# Patient Record
Sex: Male | Born: 1945
Health system: Southern US, Community
[De-identification: ages and names within clinical notes are randomized; demographics above are authoritative.]

## PROBLEM LIST (undated history)

## (undated) ENCOUNTER — Encounter

## (undated) ENCOUNTER — Telehealth

## (undated) DIAGNOSIS — K219 Gastro-esophageal reflux disease without esophagitis: Secondary | ICD-10-CM

## (undated) DIAGNOSIS — I10 Essential (primary) hypertension: Secondary | ICD-10-CM

## (undated) DIAGNOSIS — E7800 Pure hypercholesterolemia, unspecified: Secondary | ICD-10-CM

## (undated) DIAGNOSIS — M1990 Unspecified osteoarthritis, unspecified site: Secondary | ICD-10-CM

## (undated) DIAGNOSIS — E119 Type 2 diabetes mellitus without complications: Secondary | ICD-10-CM

## (undated) DIAGNOSIS — I2510 Atherosclerotic heart disease of native coronary artery without angina pectoris: Secondary | ICD-10-CM

## (undated) DIAGNOSIS — D469 Myelodysplastic syndrome, unspecified: Secondary | ICD-10-CM

## (undated) DIAGNOSIS — C229 Malignant neoplasm of liver, not specified as primary or secondary: Secondary | ICD-10-CM

## (undated) HISTORY — DX: Myelodysplastic syndrome, unspecified: D46.9

---

## 2000-01-05 HISTORY — PX: APPENDECTOMY: SHX54

## 2011-07-01 LAB — COMPREHENSIVE METABOLIC PANEL
BUN: 11 mg/dL (ref 7–18)
Bilirubin,Total: 0.9 mg/dL (ref 0.2–1.0)
Calcium, Total: 8.6 mg/dL (ref 8.5–10.1)
Chloride: 103 mmol/L (ref 98–107)
Creatinine: 1.32 mg/dL — ABNORMAL HIGH (ref 0.60–1.30)
Potassium: 3.6 mmol/L (ref 3.5–5.1)
SGOT(AST): 35 U/L (ref 15–37)
Sodium: 138 mmol/L (ref 136–145)

## 2011-07-01 LAB — CBC
HGB: 16.5 g/dL (ref 13.0–18.0)
MCH: 32.9 pg (ref 26.0–34.0)
MCHC: 34.7 g/dL (ref 32.0–36.0)
MCV: 95 fL (ref 80–100)
Platelet: 111 10*3/uL — ABNORMAL LOW (ref 150–440)
RBC: 5.01 10*6/uL (ref 4.40–5.90)
WBC: 10.3 10*3/uL (ref 3.8–10.6)

## 2011-07-01 LAB — CK TOTAL AND CKMB (NOT AT ARMC): CK, Total: 159 U/L (ref 35–232)

## 2011-07-01 IMAGING — CR DG CHEST 2V
1 series · 2 of 2 positions shown · non-contrast
Comparison: none

REASON FOR EXAM: chest pain
COMMENTS:   May transport without cardiac monitor

PROCEDURE:     DXR - DXR CHEST PA (OR AP) AND LATERAL  - [DATE] [DATE]
RESULT:     The lungs are clear. The heart and pulmonary vessels are normal.
The bony and mediastinal structures are unremarkable. There is no effusion.
There is no pneumothorax or evidence of congestive failure.

[Series 1: pa · 0.17mm/px · 2 of 2 slices shown]
[im 1/2]
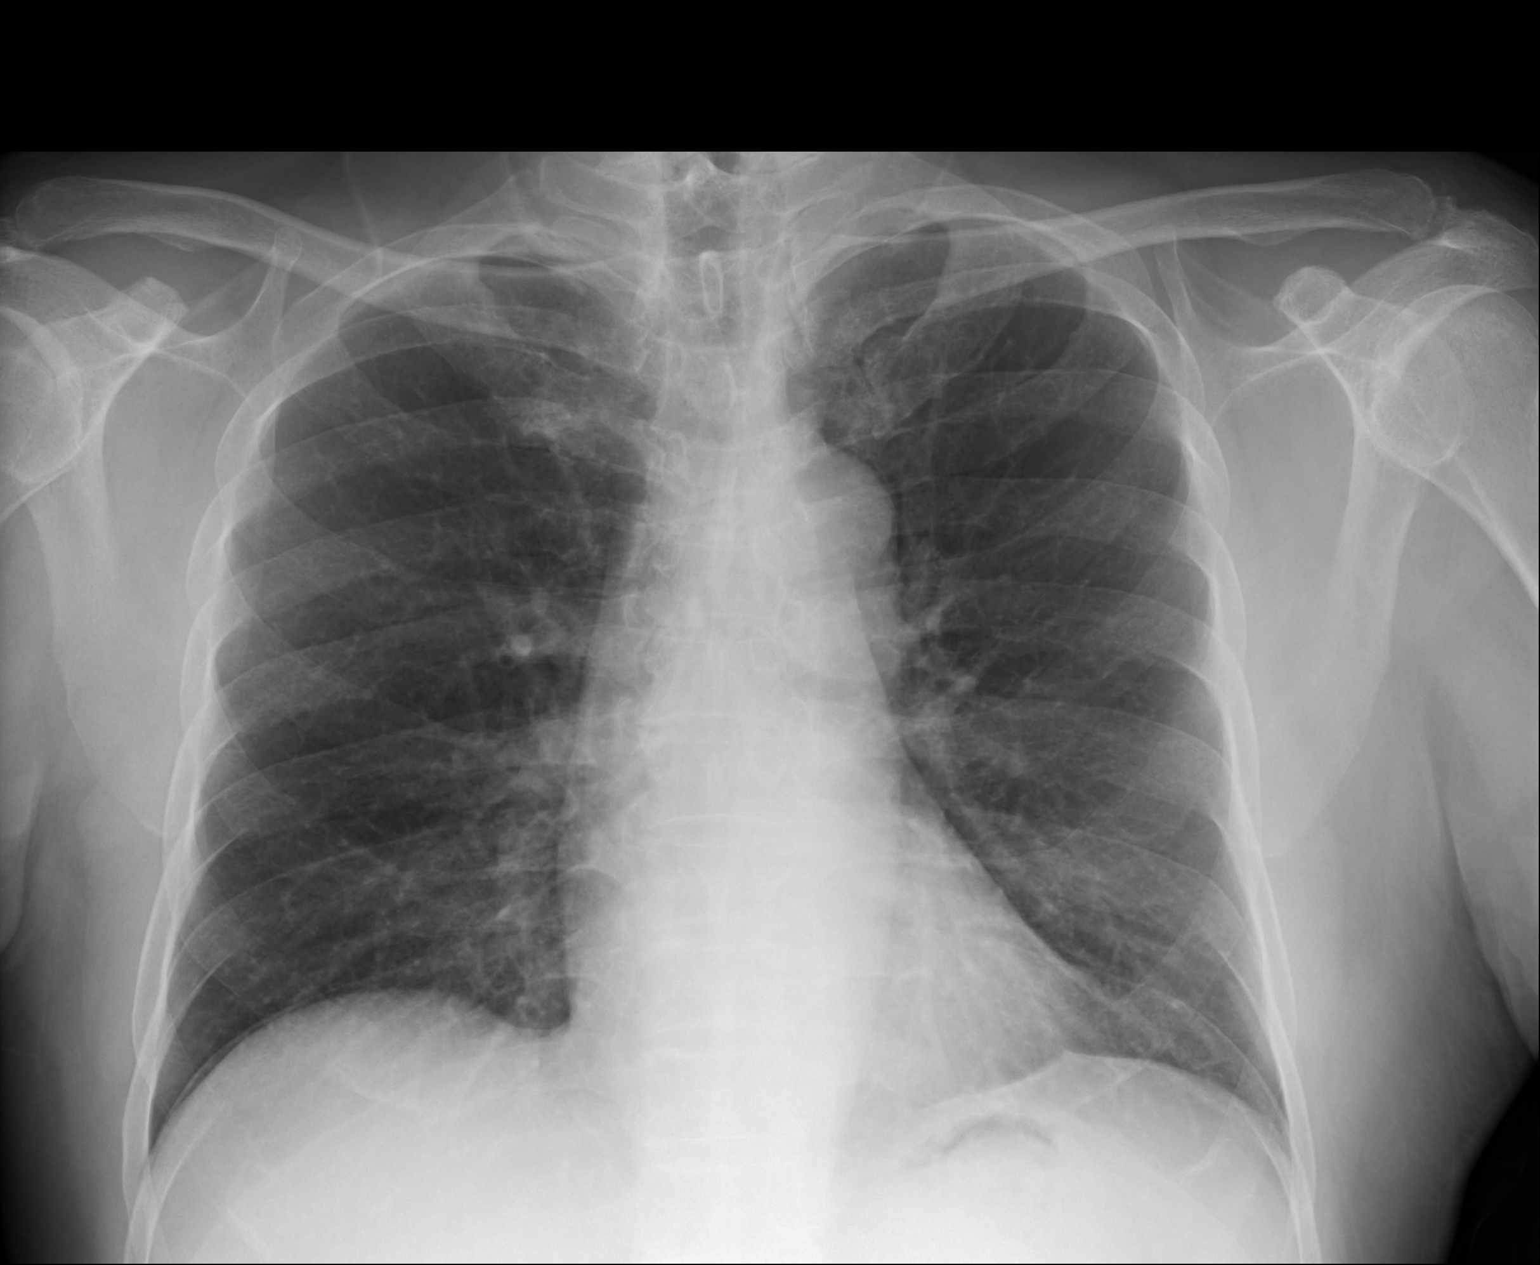
[im 2/2]
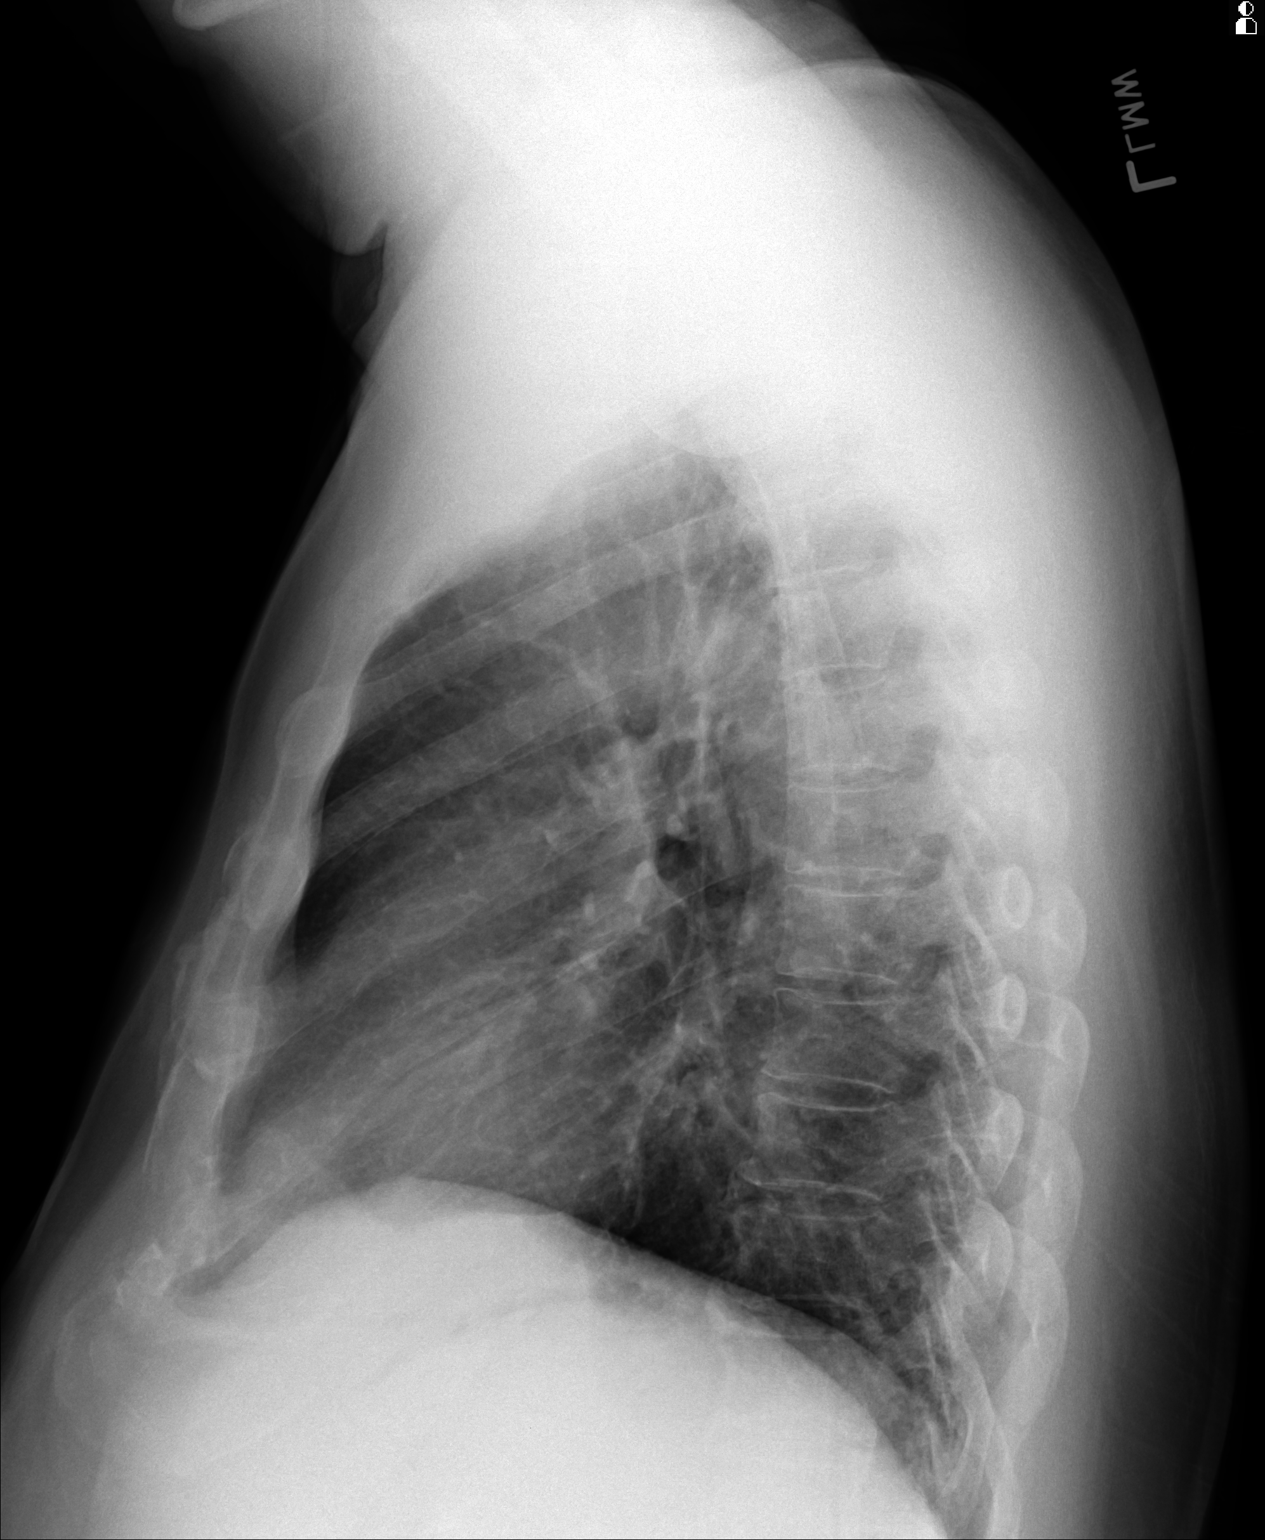

[2 of 2 positions shown; findings below may reference images not displayed]

IMPRESSION: No acute cardiopulmonary disease.

[REDACTED]

## 2011-07-02 ENCOUNTER — Observation Stay: Payer: Self-pay | Admitting: Internal Medicine

## 2011-07-02 LAB — URINALYSIS, COMPLETE
Bilirubin,UR: NEGATIVE
Leukocyte Esterase: NEGATIVE
Nitrite: NEGATIVE
Ph: 5 (ref 4.5–8.0)
RBC,UR: NONE SEEN /HPF (ref 0–5)
WBC UR: NONE SEEN /HPF (ref 0–5)

## 2011-07-02 LAB — TROPONIN I: Troponin-I: <0.02 ng/mL

## 2011-07-05 DIAGNOSIS — E119 Type 2 diabetes mellitus without complications: Secondary | ICD-10-CM | POA: Insufficient documentation

## 2011-10-04 DIAGNOSIS — K219 Gastro-esophageal reflux disease without esophagitis: Secondary | ICD-10-CM | POA: Insufficient documentation

## 2012-02-29 DIAGNOSIS — R0789 Other chest pain: Secondary | ICD-10-CM | POA: Insufficient documentation

## 2012-04-14 ENCOUNTER — Ambulatory Visit: Payer: Self-pay

## 2012-04-14 IMAGING — CR DG ABDOMEN 1V
1 series · 2 of 2 positions shown · non-contrast
Comparison: none

REASON FOR EXAM: kidney stones
COMMENTS:

PROCEDURE:     KDR - KDXR KIDNEY URETER BLADDER  - [DATE]  [DATE]
RESULT:     Comparisons:  None

[Series 1: supine kub · 0.17mm/px · 2 of 2 slices shown]
[im 1/2]
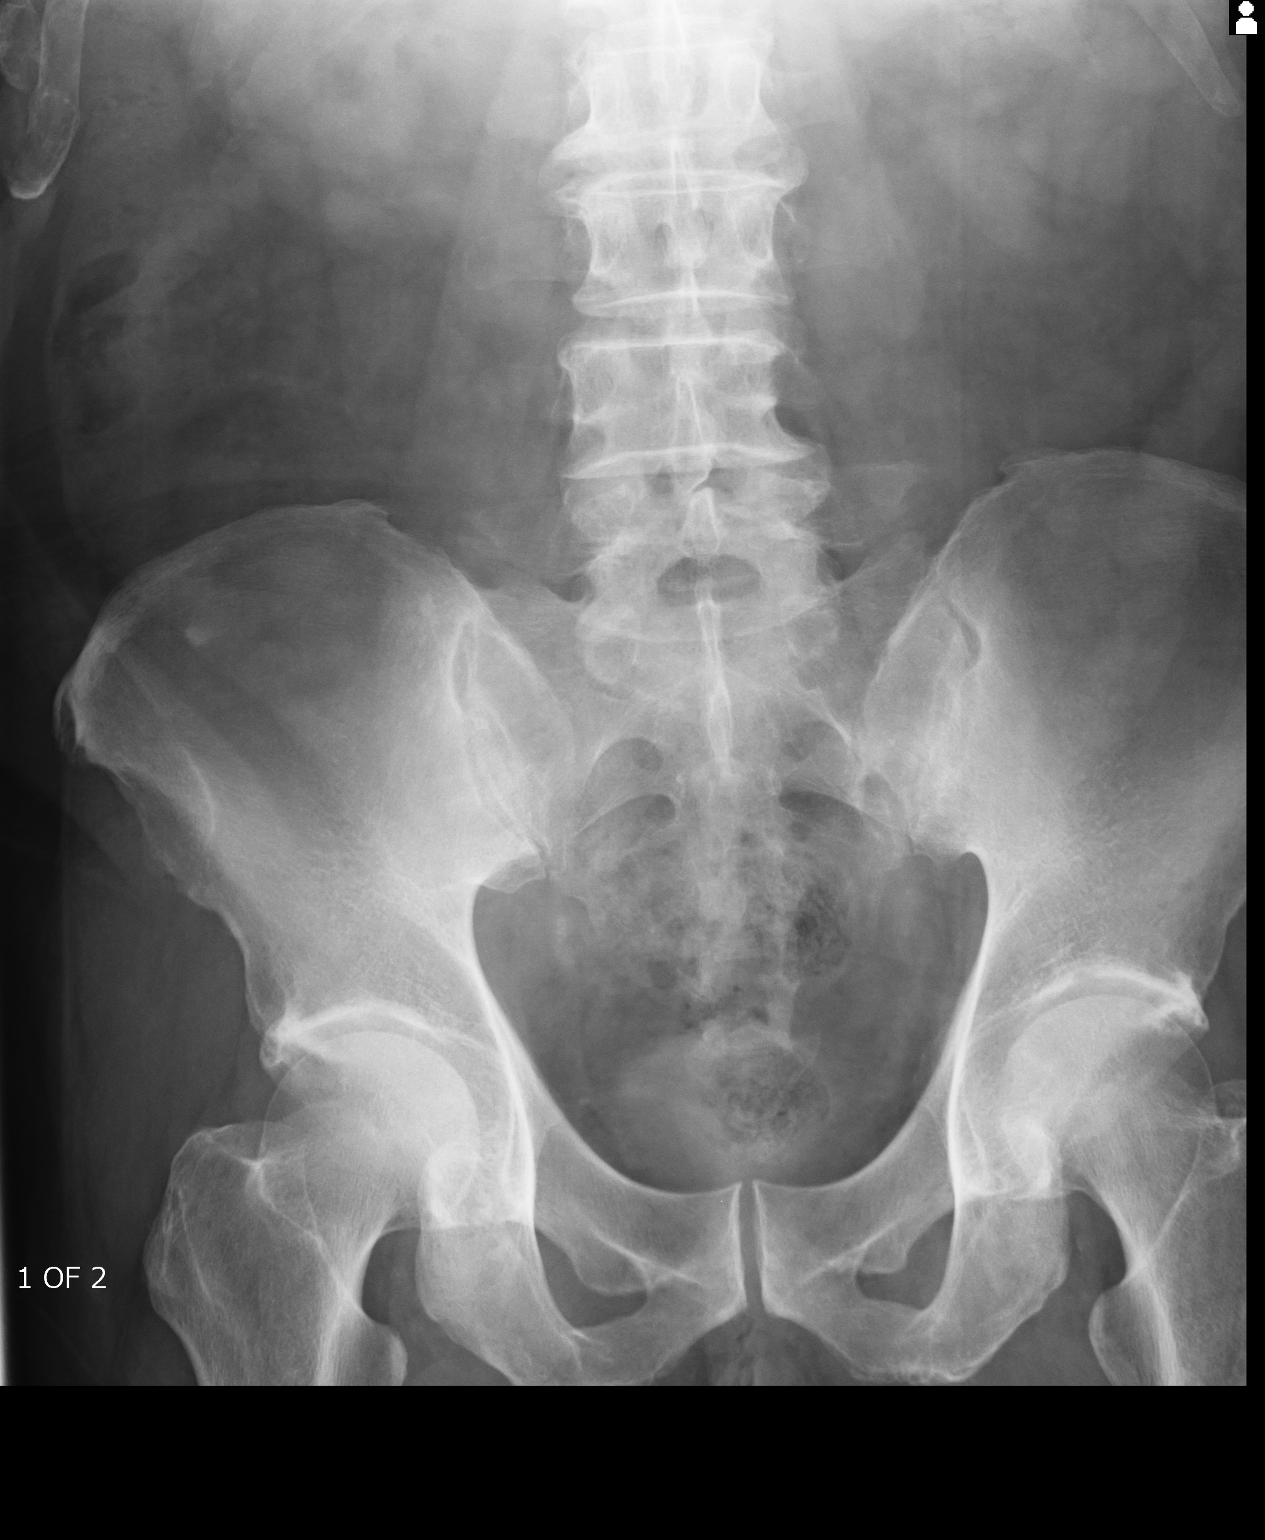
[im 2/2]
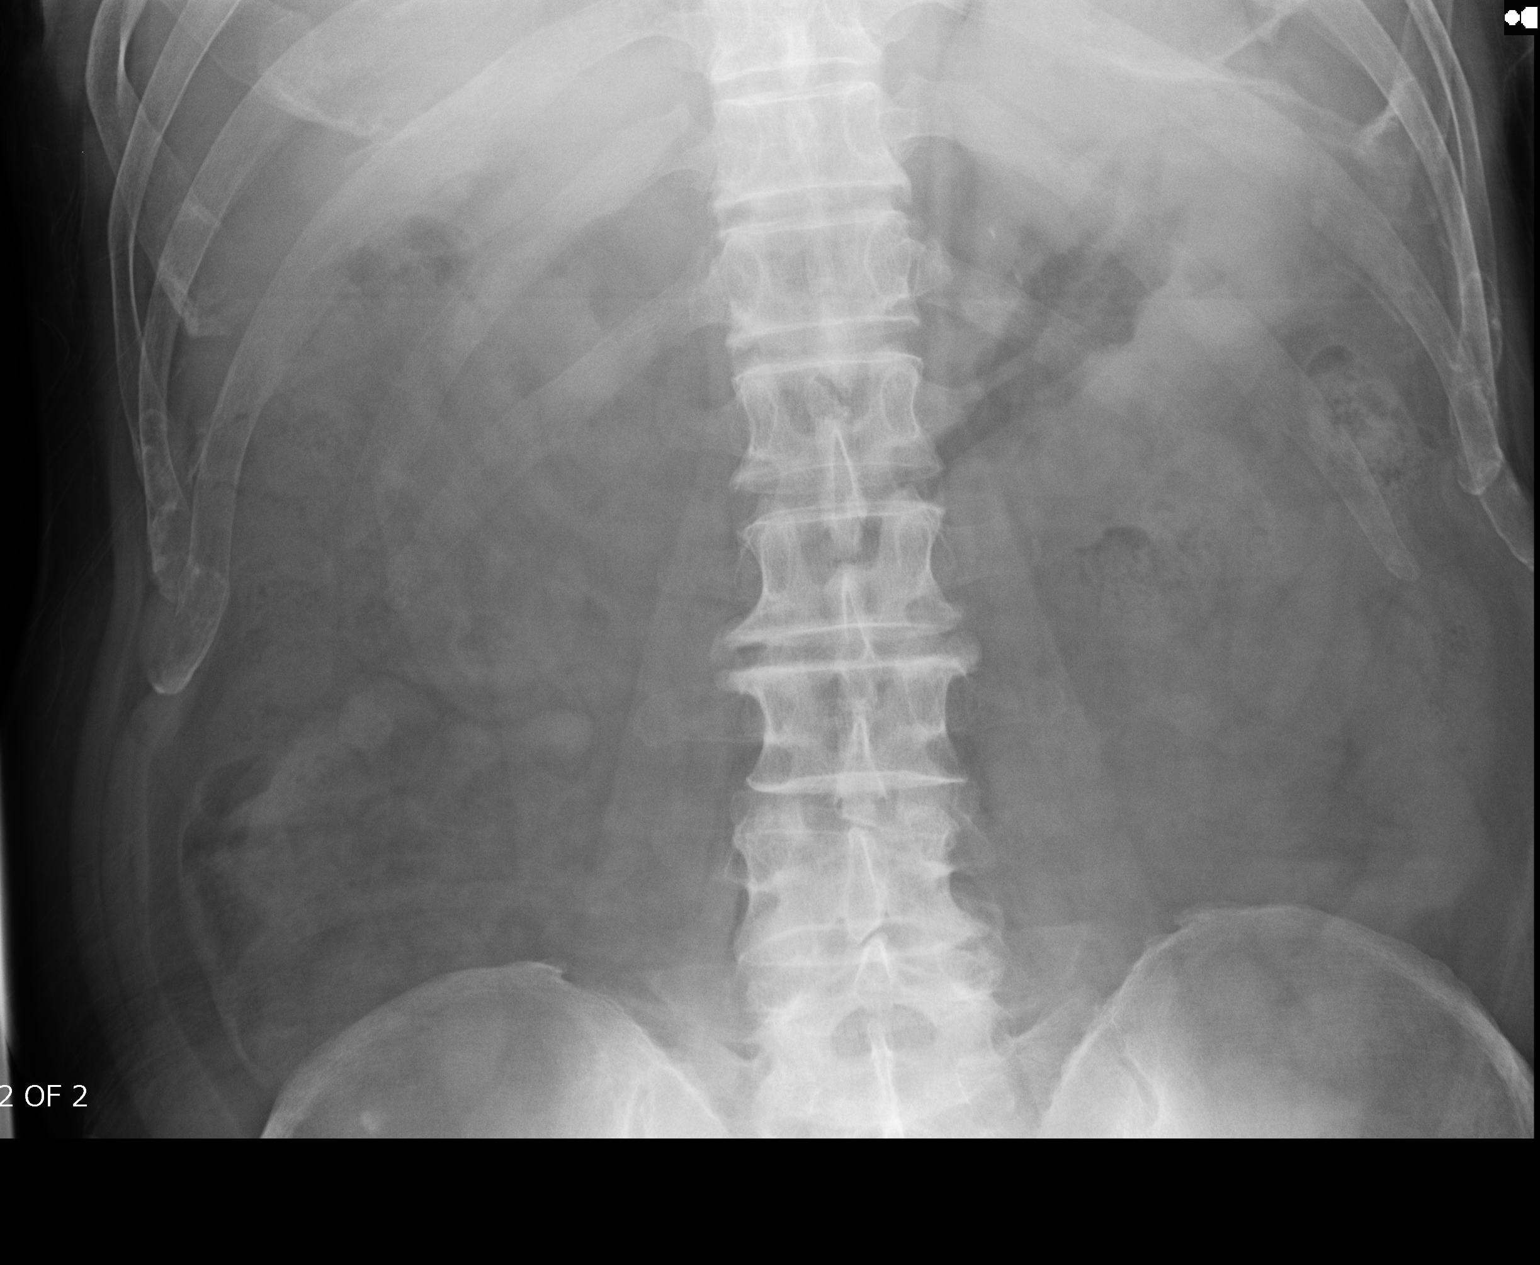

[2 of 2 positions shown; findings below may reference images not displayed]

FINDINGS: Supine radiograph of the abdomen is provided.

There is a nonspecific bowel gas pattern. There is no bowel dilatation to
suggest obstruction. There are 2 calcifications projecting over the left
kidney. There is no pathologic calcification along the expected course of
the ureters. There is no evidence of pneumoperitoneum, portal venous gas, or
pneumatosis.

The osseous structures are unremarkable.
IMPRESSION: Two calcifications projecting over the left kidney concerning for
nephrolithiasis.

[REDACTED]

## 2012-06-22 ENCOUNTER — Ambulatory Visit: Payer: Self-pay | Admitting: Urology

## 2012-06-22 IMAGING — CT CT ABDOMEN AND PELVIS WITHOUT AND WITH CONTRAST
2 of 4 series · 14 of 32 positions shown, 19 images · non-contrast
Comparison: none

REASON FOR EXAM: Renal Neoplasm
COMMENTS:

PROCEDURE:     KCT - KCT ABDOMEN/PELVIS W/WO  - [DATE]  [DATE]
RESULT:     History call renal neoplasm.
Comparison Study: KUB [DATE].

[Series 4: with 3.0 i40f 3 · axial · 0.82mm/px · z∈[-1108,-727]mm · 8 of 165 slices shown, 13 images]
[im 19/165  soft-tissue]
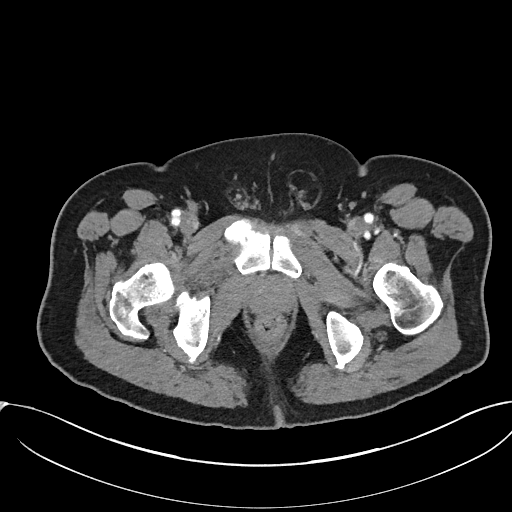
[im 19/165  bone]
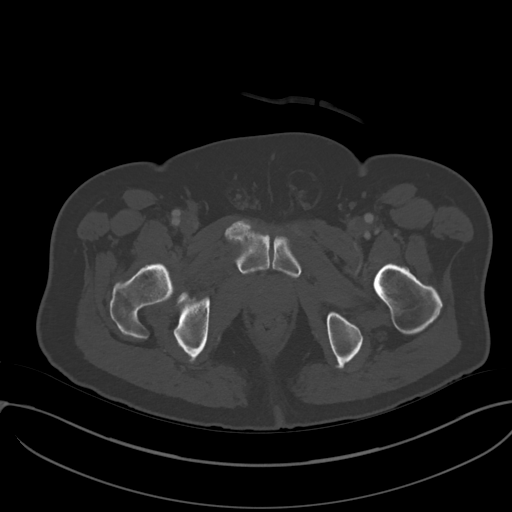
[im 37/165  soft-tissue]
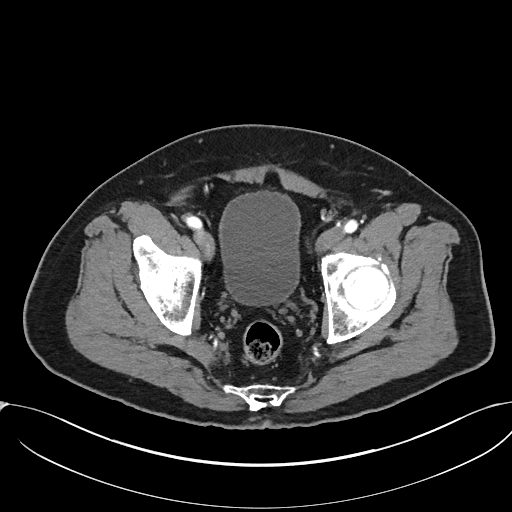
[im 55/165  soft-tissue]
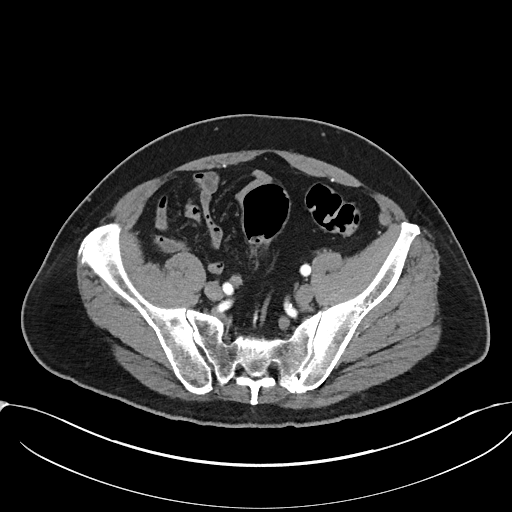
[im 73/165  soft-tissue]
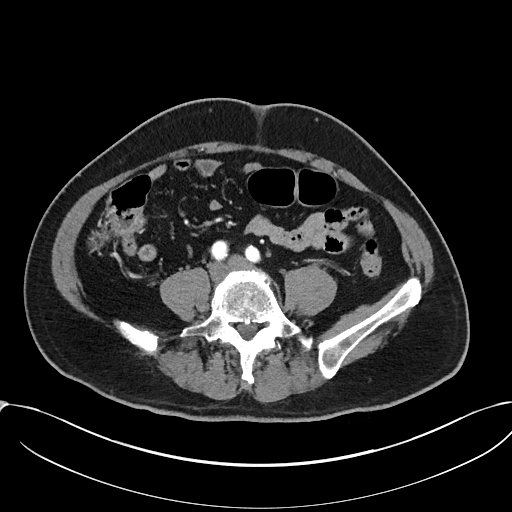
[im 92/165  soft-tissue]
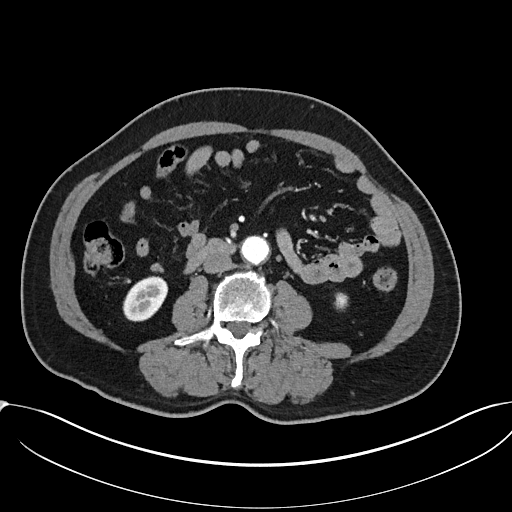
[im 92/165  lung]
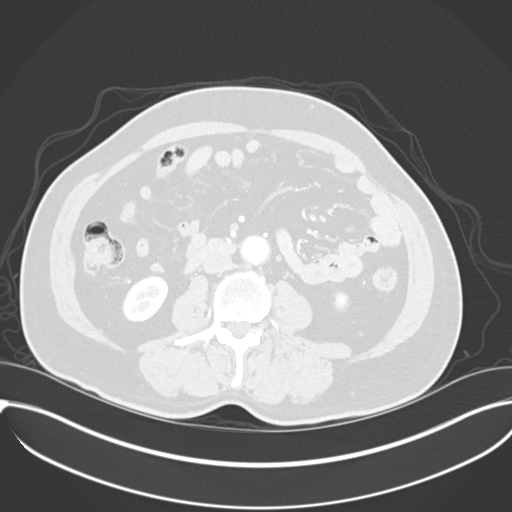
[im 110/165  soft-tissue]
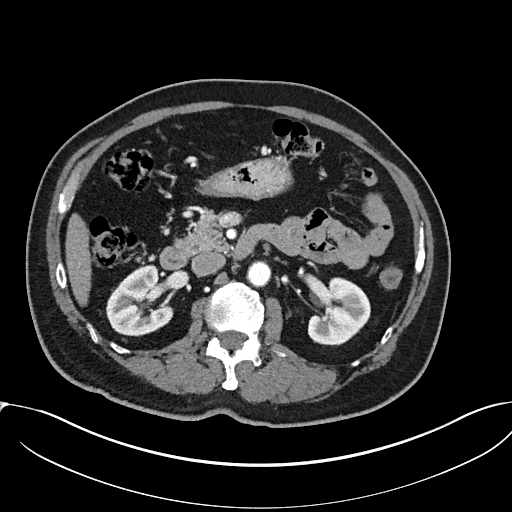
[im 110/165  lung]
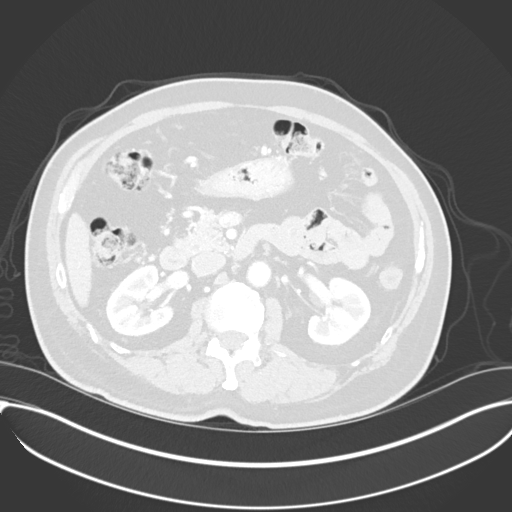
[im 128/165  soft-tissue]
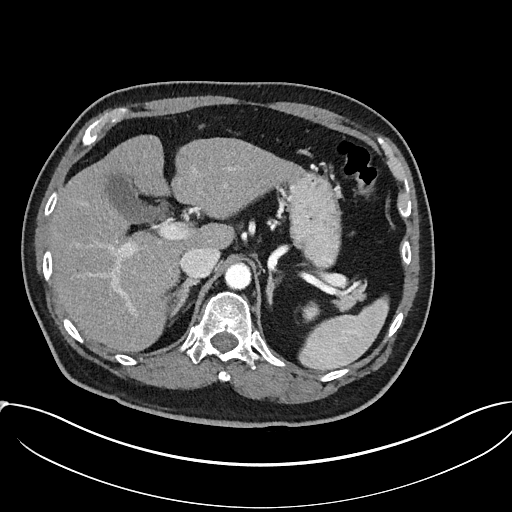
[im 128/165  lung]
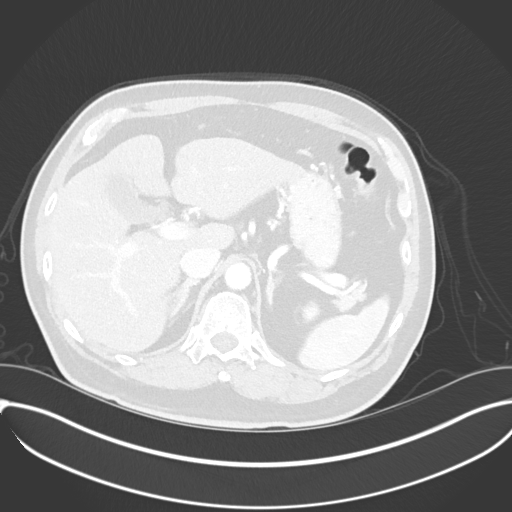
[im 146/165  soft-tissue]
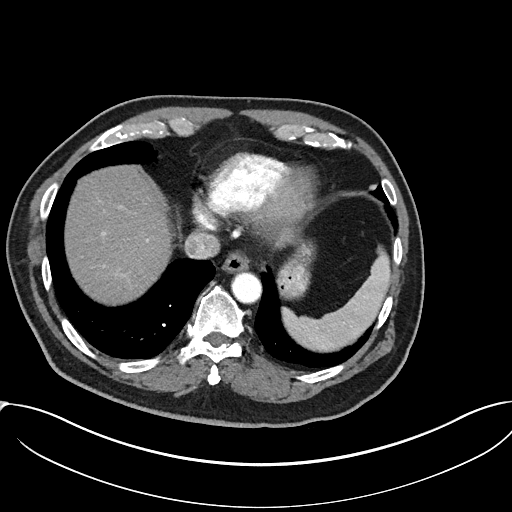
[im 146/165  lung]
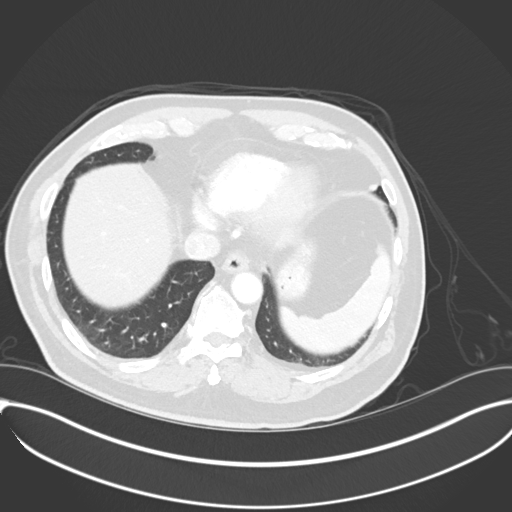

[Series 6: delay 3.0 i40f 3 · axial · delayed · 0.82mm/px · z∈[-1108,-835]mm · 6 of 165 slices shown]
[im 19/165  soft-tissue]
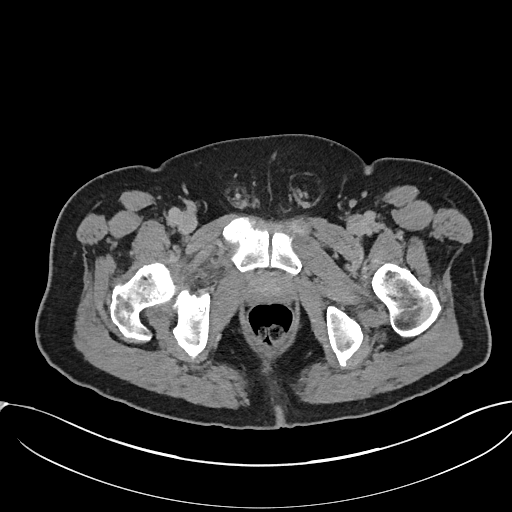
[im 37/165  soft-tissue]
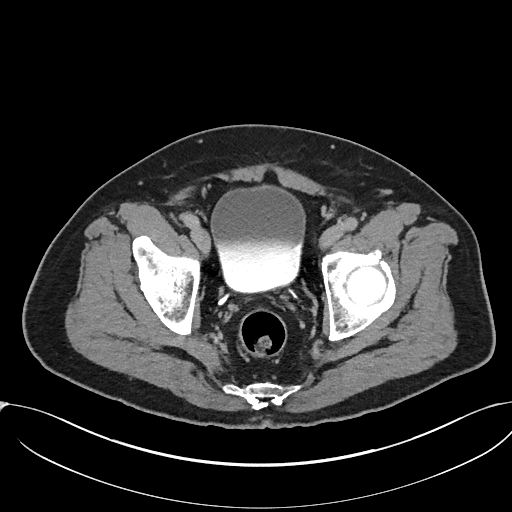
[im 55/165  soft-tissue]
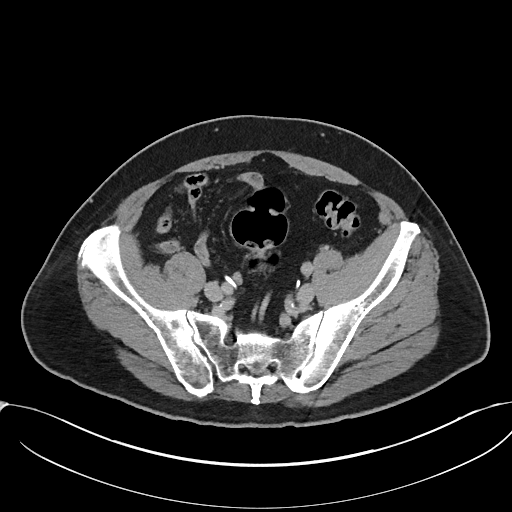
[im 73/165  soft-tissue]
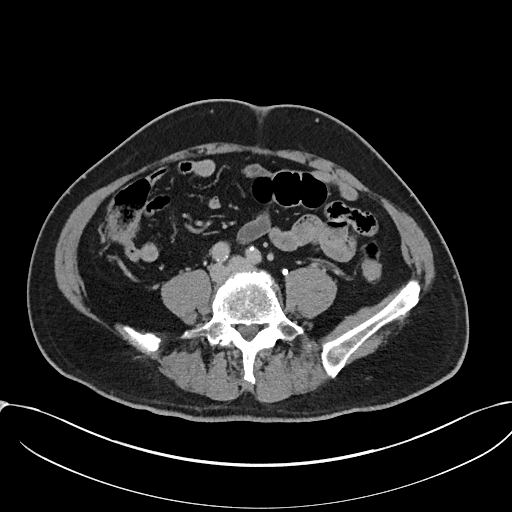
[im 92/165  soft-tissue]
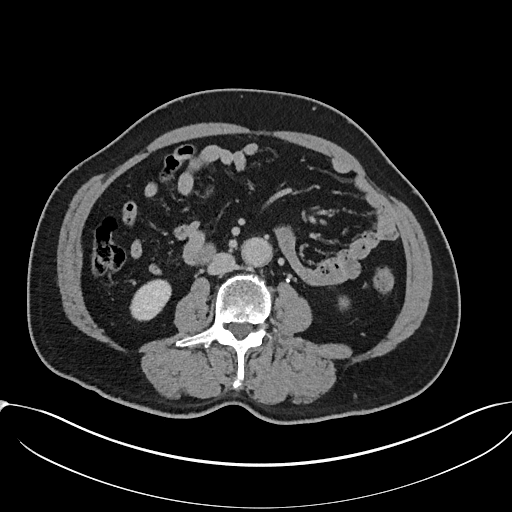
[im 110/165  soft-tissue]
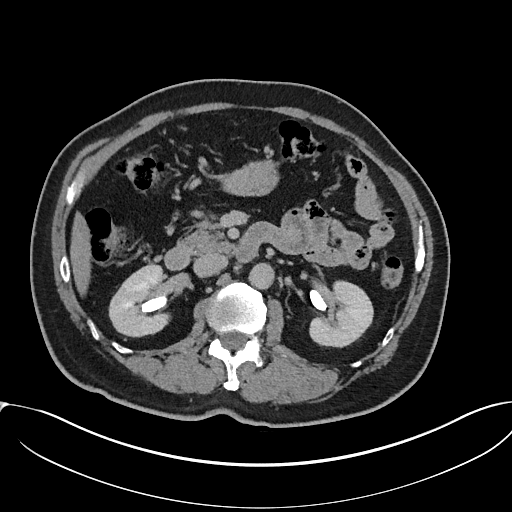

[14 of 32 positions shown; findings below may reference images not displayed]

FINDINGS: Standard triphasic CT obtained.
2.2 cm simple left renal cyst is present. The bladder is nondistended. No
focal bladder lesion identified. The prostate is enlarged and nodular.
Prostate malignancy cannot be excluded. Small inguinal lymph nodes are
present. Shotty retroperitoneal lymph nodes are noted.

Liver is normal. Gallbladder nondistended. No biliary distention. The
pancreas is normal. The spleen is normal. The hepatic veins are patent.
Portal vein and splenic vein patent. Adrenals normal. No inflammatory change
in right or left lower quadrant. Appendix is not visualized. Mild ectasia of
the abdomen was noted. No bowel distention. No free air. No acute bony
abnormality identified.
IMPRESSION: 1. Enlarged prostate with nodularity. Prostate malignancy cannot be excluded.
2. Simple left renal cyst. Kidneys are otherwise unremarkable. No nephro or
urolithiasis.

## 2012-10-24 ENCOUNTER — Encounter (HOSPITAL_COMMUNITY): Payer: Self-pay | Admitting: General Practice

## 2012-10-24 ENCOUNTER — Inpatient Hospital Stay (HOSPITAL_COMMUNITY): Payer: Medicare Other

## 2012-10-24 ENCOUNTER — Inpatient Hospital Stay (HOSPITAL_COMMUNITY)
Admission: AD | Admit: 2012-10-24 | Discharge: 2012-10-30 | DRG: 236 | Disposition: A | Discharge status: Home / Self Care | Payer: Medicare Other | Source: Other Acute Inpatient Hospital | Attending: Thoracic Surgery (Cardiothoracic Vascular Surgery) | Admitting: Thoracic Surgery (Cardiothoracic Vascular Surgery)

## 2012-10-24 ENCOUNTER — Ambulatory Visit: Payer: Self-pay | Admitting: Cardiovascular Disease

## 2012-10-24 DIAGNOSIS — I4891 Unspecified atrial fibrillation: Secondary | ICD-10-CM | POA: Diagnosis not present

## 2012-10-24 DIAGNOSIS — J90 Pleural effusion, not elsewhere classified: Secondary | ICD-10-CM | POA: Diagnosis not present

## 2012-10-24 DIAGNOSIS — I10 Essential (primary) hypertension: Secondary | ICD-10-CM

## 2012-10-24 DIAGNOSIS — E1169 Type 2 diabetes mellitus with other specified complication: Secondary | ICD-10-CM

## 2012-10-24 DIAGNOSIS — Z951 Presence of aortocoronary bypass graft: Secondary | ICD-10-CM

## 2012-10-24 DIAGNOSIS — Z7982 Long term (current) use of aspirin: Secondary | ICD-10-CM

## 2012-10-24 DIAGNOSIS — I2510 Atherosclerotic heart disease of native coronary artery without angina pectoris: Secondary | ICD-10-CM

## 2012-10-24 DIAGNOSIS — E1165 Type 2 diabetes mellitus with hyperglycemia: Secondary | ICD-10-CM

## 2012-10-24 DIAGNOSIS — Z79899 Other long term (current) drug therapy: Secondary | ICD-10-CM

## 2012-10-24 DIAGNOSIS — K219 Gastro-esophageal reflux disease without esophagitis: Secondary | ICD-10-CM | POA: Diagnosis present

## 2012-10-24 DIAGNOSIS — E119 Type 2 diabetes mellitus without complications: Secondary | ICD-10-CM | POA: Diagnosis present

## 2012-10-24 DIAGNOSIS — E785 Hyperlipidemia, unspecified: Secondary | ICD-10-CM | POA: Diagnosis present

## 2012-10-24 DIAGNOSIS — D62 Acute posthemorrhagic anemia: Secondary | ICD-10-CM | POA: Diagnosis not present

## 2012-10-24 DIAGNOSIS — J988 Other specified respiratory disorders: Secondary | ICD-10-CM | POA: Diagnosis not present

## 2012-10-24 DIAGNOSIS — Z87891 Personal history of nicotine dependence: Secondary | ICD-10-CM

## 2012-10-24 DIAGNOSIS — E8779 Other fluid overload: Secondary | ICD-10-CM | POA: Diagnosis not present

## 2012-10-24 DIAGNOSIS — J9819 Other pulmonary collapse: Secondary | ICD-10-CM | POA: Diagnosis not present

## 2012-10-24 HISTORY — DX: Pure hypercholesterolemia, unspecified: E78.00

## 2012-10-24 HISTORY — PX: CARDIAC CATHETERIZATION: SHX172

## 2012-10-24 HISTORY — DX: Essential (primary) hypertension: I10

## 2012-10-24 HISTORY — DX: Unspecified osteoarthritis, unspecified site: M19.90

## 2012-10-24 HISTORY — DX: Gastro-esophageal reflux disease without esophagitis: K21.9

## 2012-10-24 HISTORY — DX: Atherosclerotic heart disease of native coronary artery without angina pectoris: I25.10

## 2012-10-24 HISTORY — DX: Type 2 diabetes mellitus without complications: E11.9

## 2012-10-24 LAB — COMPREHENSIVE METABOLIC PANEL
Alkaline Phosphatase: 60 U/L (ref 50–136)
Anion Gap: 5 — ABNORMAL LOW (ref 7–16)
BUN: 15 mg/dL (ref 7–18)
Bilirubin,Total: 0.7 mg/dL (ref 0.2–1.0)
Calcium, Total: 8.9 mg/dL (ref 8.5–10.1)
Chloride: 106 mmol/L (ref 98–107)
Co2: 27 mmol/L (ref 21–32)
Creatinine: 1.01 mg/dL (ref 0.60–1.30)
EGFR (African American): >60
EGFR (Non-African Amer.): >60
Glucose: 115 mg/dL — ABNORMAL HIGH (ref 65–99)
Potassium: 3.9 mmol/L (ref 3.5–5.1)
SGOT(AST): 29 U/L (ref 15–37)
SGPT (ALT): 43 U/L (ref 12–78)
Sodium: 138 mmol/L (ref 136–145)

## 2012-10-24 LAB — GLUCOSE, CAPILLARY: Glucose-Capillary: 114 mg/dL — ABNORMAL HIGH (ref 70–99)

## 2012-10-24 LAB — CBC WITH DIFFERENTIAL/PLATELET
Eosinophil %: 2 %
HCT: 43.8 % (ref 40.0–52.0)
HGB: 15.6 g/dL (ref 13.0–18.0)
Lymphocyte #: 1.8 10*3/uL (ref 1.0–3.6)
Lymphocyte %: 24.3 %
MCHC: 35.6 g/dL (ref 32.0–36.0)
Monocyte #: 0.5 x10 3/mm (ref 0.2–1.0)
Neutrophil %: 65.8 %
Platelet: 165 10*3/uL (ref 150–440)
RBC: 4.79 10*6/uL (ref 4.40–5.90)
RDW: 13.5 % (ref 11.5–14.5)
WBC: 7.2 10*3/uL (ref 3.8–10.6)

## 2012-10-24 LAB — PROTIME-INR
INR: 1
Prothrombin Time: 13.4 secs (ref 11.5–14.7)

## 2012-10-24 LAB — CBC
Hemoglobin: 14.9 g/dL (ref 13.0–17.0)
MCH: 32.7 pg (ref 26.0–34.0)
Platelets: 146 10*3/uL — ABNORMAL LOW (ref 150–400)
RBC: 4.55 MIL/uL (ref 4.22–5.81)

## 2012-10-24 LAB — APTT: Activated PTT: 33.1 secs (ref 23.6–35.9)

## 2012-10-24 IMAGING — CR DG CHEST 2V
2 series · 2 of 2 positions shown · non-contrast
Comparison: None.

CLINICAL DATA: Preop CABG

EXAM:
CHEST  2 VIEW

[w chest pa]
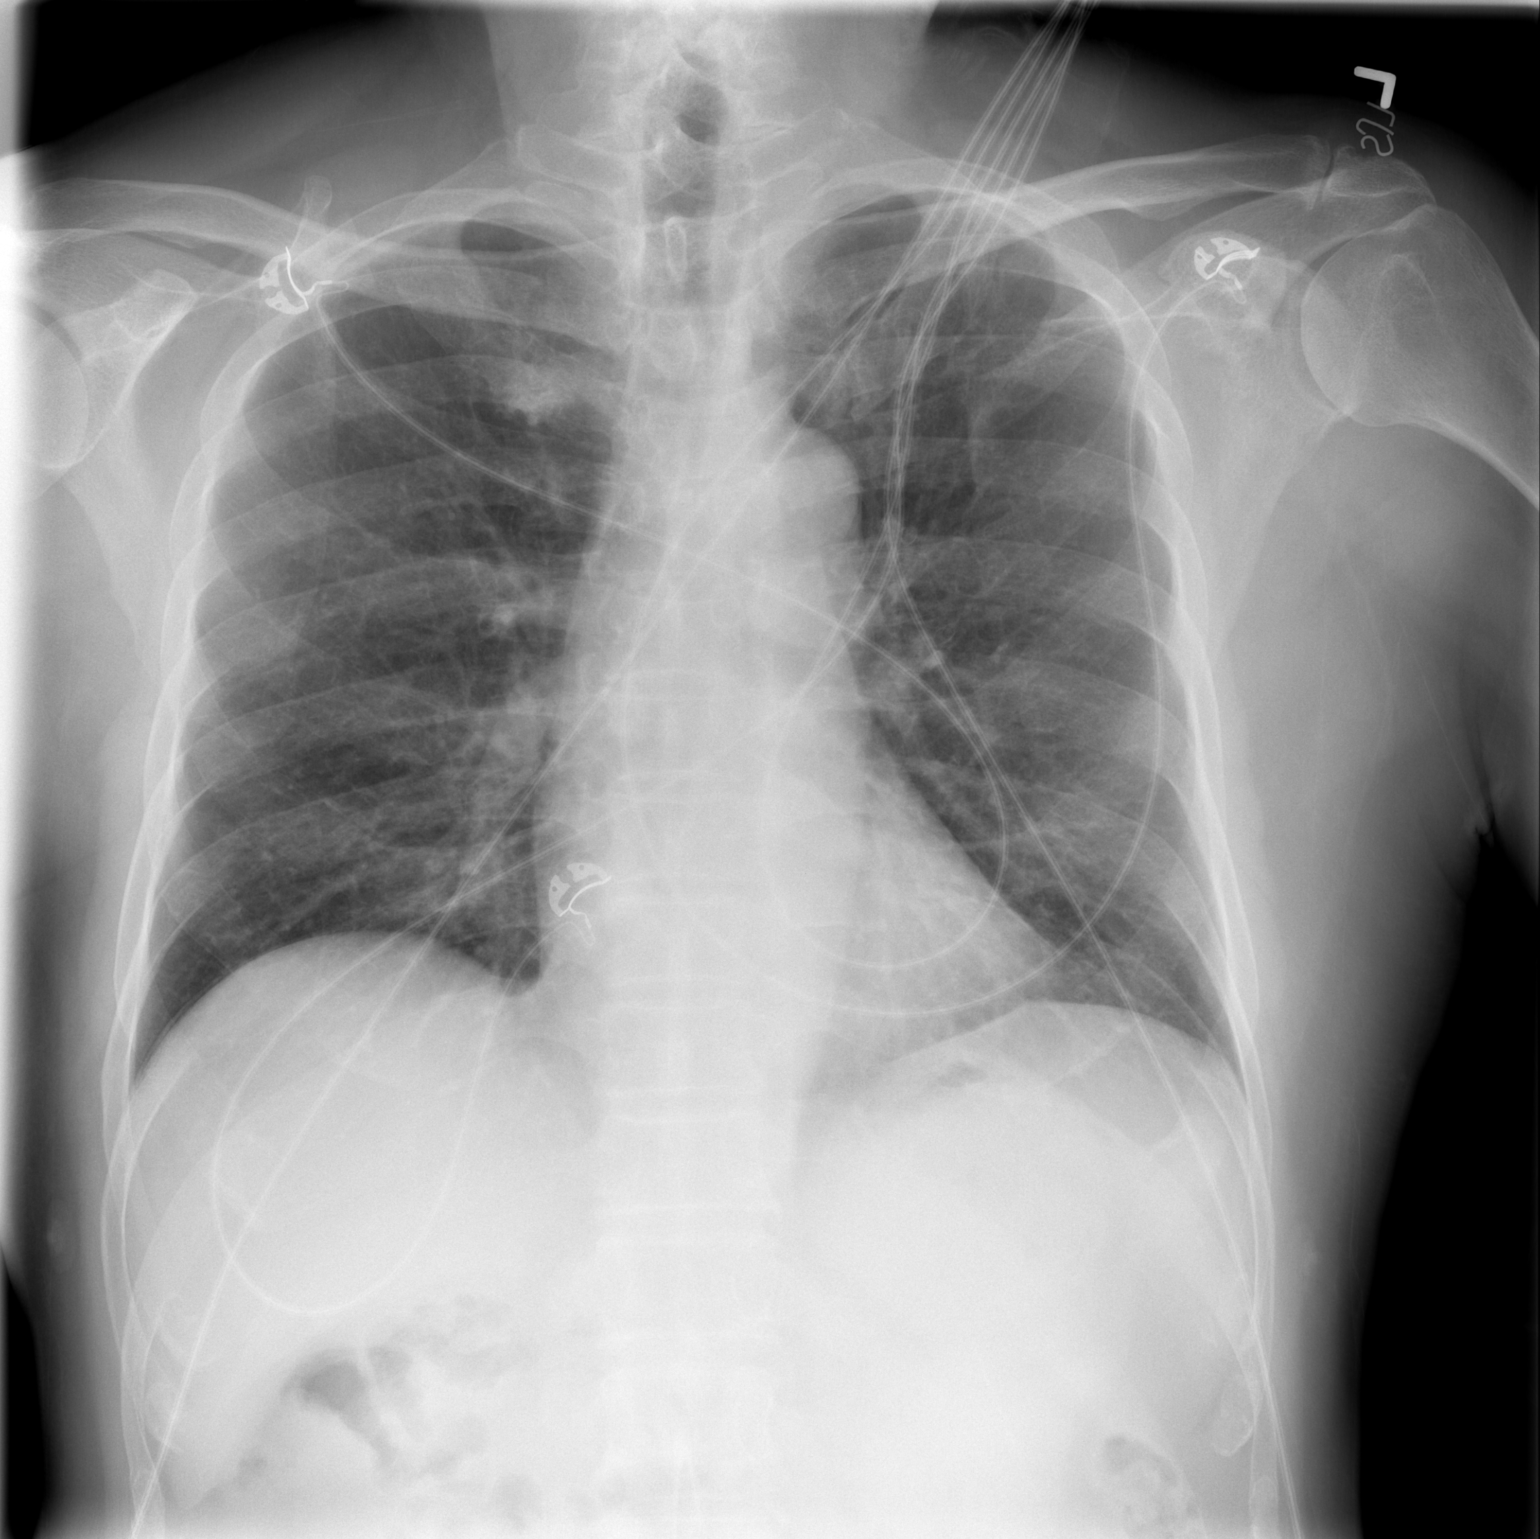

[w chest lat]
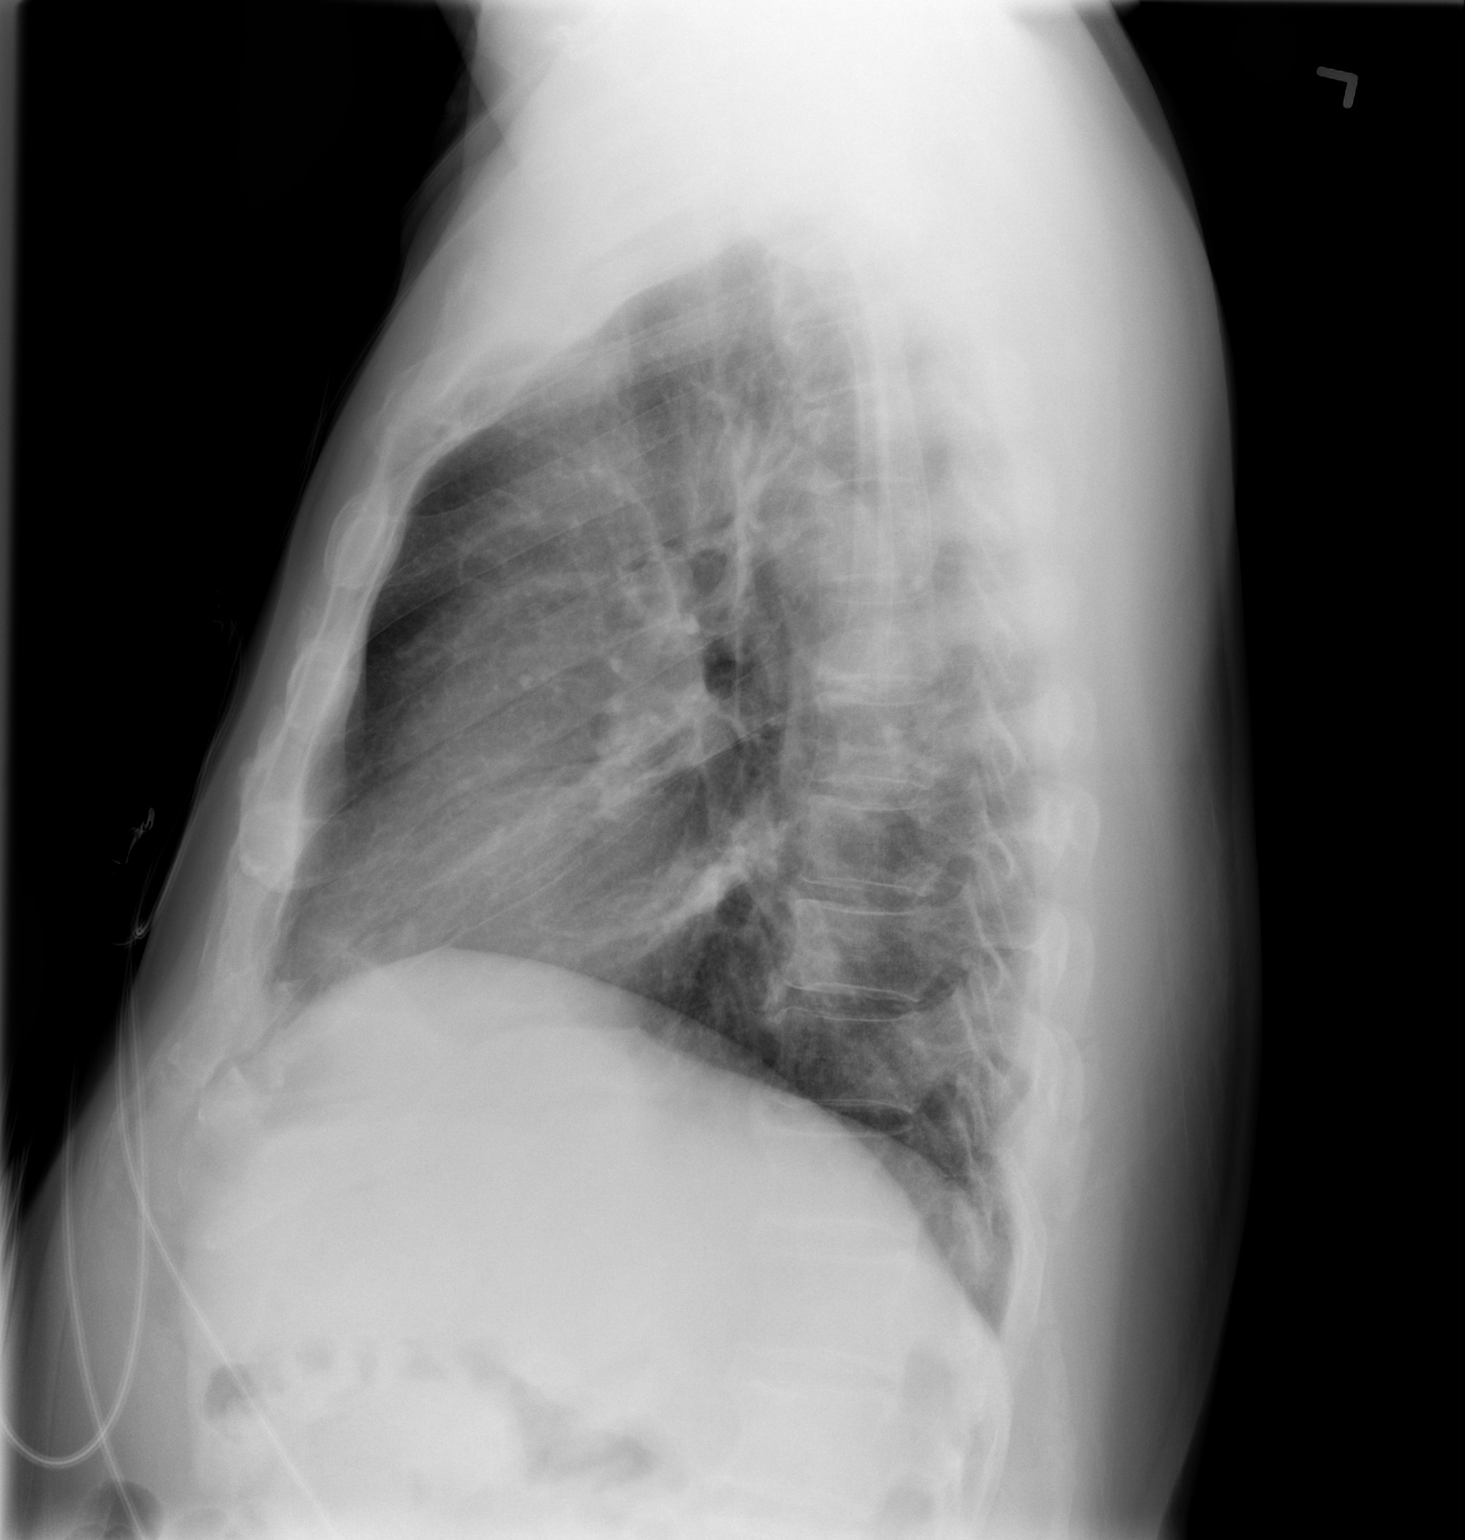

[2 of 2 positions shown; findings below may reference images not displayed]

FINDINGS: Heart size is normal. Vascularity is normal. Lungs are clear without
infiltrate effusion or mass.
IMPRESSION: No active cardiopulmonary disease.

## 2012-10-24 MED ORDER — ACETAMINOPHEN 650 MG RE SUPP: 650.0000 mg | Freq: Four times a day (QID) | RECTAL | Status: DC | PRN

## 2012-10-24 MED ORDER — SODIUM CHLORIDE 0.9 % IJ SOLN
3.0000 mL | Freq: Two times a day (BID) | INTRAMUSCULAR | Status: DC
  Administered 2012-10-24 – 2012-10-25 (×2): 3 mL via INTRAVENOUS

## 2012-10-24 MED ORDER — ALUM & MAG HYDROXIDE-SIMETH 200-200-20 MG/5ML PO SUSP: 30.0000 mL | Freq: Four times a day (QID) | ORAL | Status: DC | PRN

## 2012-10-24 MED ORDER — OXYCODONE HCL 5 MG PO TABS: 5.0000 mg | ORAL_TABLET | ORAL | Status: DC | PRN

## 2012-10-24 MED ORDER — NIACIN ER (ANTIHYPERLIPIDEMIC) 500 MG PO TBCR
500.0000 mg | EXTENDED_RELEASE_TABLET | Freq: Every day | ORAL | Status: DC
  Filled 2012-10-24 (×3): qty 1

## 2012-10-24 MED ORDER — HEPARIN (PORCINE) IN NACL 100-0.45 UNIT/ML-% IJ SOLN
1300.0000 [IU]/h | INTRAMUSCULAR | Status: DC
  Administered 2012-10-24 – 2012-10-25 (×2): 1300 [IU]/h via INTRAVENOUS
  Filled 2012-10-24 (×4): qty 250

## 2012-10-24 MED ORDER — SODIUM CHLORIDE 0.9 % IJ SOLN: 3.0000 mL | Freq: Two times a day (BID) | INTRAMUSCULAR | Status: DC

## 2012-10-24 MED ORDER — ASPIRIN EC 325 MG PO TBEC
325.0000 mg | DELAYED_RELEASE_TABLET | Freq: Every day | ORAL | Status: DC
  Administered 2012-10-24 – 2012-10-25 (×2): 325 mg via ORAL
  Filled 2012-10-24 (×3): qty 1

## 2012-10-24 MED ORDER — SODIUM CHLORIDE 0.9 % IJ SOLN: 3.0000 mL | INTRAMUSCULAR | Status: DC | PRN

## 2012-10-24 MED ORDER — PANTOPRAZOLE SODIUM 40 MG PO TBEC
40.0000 mg | DELAYED_RELEASE_TABLET | Freq: Every day | ORAL | Status: DC
  Administered 2012-10-24 – 2012-10-25 (×2): 40 mg via ORAL
  Filled 2012-10-24 (×2): qty 1

## 2012-10-24 MED ORDER — MAGNESIUM HYDROXIDE 400 MG/5ML PO SUSP: 30.0000 mL | Freq: Every day | ORAL | Status: DC | PRN

## 2012-10-24 MED ORDER — ENOXAPARIN SODIUM 40 MG/0.4ML ~~LOC~~ SOLN
40.0000 mg | SUBCUTANEOUS | Status: DC
  Filled 2012-10-24: qty 0.4

## 2012-10-24 MED ORDER — SIMVASTATIN 40 MG PO TABS
40.0000 mg | ORAL_TABLET | Freq: Every day | ORAL | Status: DC
  Administered 2012-10-25: 40 mg via ORAL
  Filled 2012-10-24 (×2): qty 1

## 2012-10-24 MED ORDER — ACETAMINOPHEN 325 MG PO TABS: 650.0000 mg | ORAL_TABLET | Freq: Four times a day (QID) | ORAL | Status: DC | PRN

## 2012-10-24 MED ORDER — INSULIN ASPART 100 UNIT/ML ~~LOC~~ SOLN: 0.0000 [IU] | Freq: Three times a day (TID) | SUBCUTANEOUS | Status: DC

## 2012-10-24 MED ORDER — NITROGLYCERIN 0.4 MG SL SUBL: 0.4000 mg | SUBLINGUAL_TABLET | SUBLINGUAL | Status: DC | PRN

## 2012-10-24 MED ORDER — ONDANSETRON HCL 4 MG/2ML IJ SOLN: 4.0000 mg | Freq: Four times a day (QID) | INTRAMUSCULAR | Status: DC | PRN

## 2012-10-24 MED ORDER — METOPROLOL SUCCINATE ER 100 MG PO TB24
100.0000 mg | ORAL_TABLET | Freq: Every day | ORAL | Status: DC
  Administered 2012-10-25: 100 mg via ORAL
  Filled 2012-10-24: qty 1

## 2012-10-24 MED ORDER — SODIUM CHLORIDE 0.9 % IV SOLN: 250.0000 mL | INTRAVENOUS | Status: DC | PRN

## 2012-10-24 MED ORDER — HEPARIN BOLUS VIA INFUSION
4000.0000 [IU] | Freq: Once | INTRAVENOUS | Status: DC
  Filled 2012-10-24: qty 4000

## 2012-10-24 MED ORDER — ONDANSETRON HCL 4 MG PO TABS: 4.0000 mg | ORAL_TABLET | Freq: Four times a day (QID) | ORAL | Status: DC | PRN

## 2012-10-24 MED ORDER — LINAGLIPTIN 5 MG PO TABS
5.0000 mg | ORAL_TABLET | Freq: Every day | ORAL | Status: DC
  Administered 2012-10-25: 5 mg via ORAL
  Filled 2012-10-24 (×3): qty 1

## 2012-10-24 MED ORDER — ALLOPURINOL 100 MG PO TABS
100.0000 mg | ORAL_TABLET | Freq: Every day | ORAL | Status: DC
  Filled 2012-10-24 (×3): qty 1

## 2012-10-24 NOTE — H&P (Signed)
Patient seen and examined. Agree with above.  Will start IV heparin  Vascular studies and PFTs tomorrow  Plan CABG Thursday AM- I discussed with the patient and his wife the general nature of the procedure and the need for general anesthesia. I discussed the expected hospital stay, overall recovery and short and long term outcomes. They understand the risks include, but are not limited to, death, stroke, MI, DVT/PE, bleeding, possible need for transfusion, infections, cardiac arrhythmias, and other organ system dysfunction including respiratory, renal, or GI complications.

## 2012-10-24 NOTE — Progress Notes (Signed)
10/24/2012 1815 Coral Ceo PAC on floor and made aware of pt. Arrival. PA to place orders.  Casmere Hollenbeck, Blanchard Kelch

## 2012-10-24 NOTE — Progress Notes (Signed)
ANTICOAGULATION CONSULT NOTE - Initial Consult  Pharmacy Consult for Heparin Indication: chest pain/ACS  Allergies  Allergen Reactions  . Soap & Cleansers     "liquid soap" antibacterial -rash  . Latex Rash    Patient Measurements: Weight: 201 lb (91.173 kg) Heparin Dosing Weight:  Vital Signs: Temp: 98.3 F (36.8 C) (10/21 2025) Temp src: Oral (10/21 2025) BP: 137/79 mmHg (10/21 2025) Pulse Rate: 63 (10/21 2025)  Labs: No results found for this basename: HGB, HCT, PLT, APTT, LABPROT, INR, HEPARINUNFRC, CREATININE, CKTOTAL, CKMB, TROPONINI,  in the last 72 hours  CrCl is unknown because no creatinine reading has been taken and the patient has no height on file.   Medical History: Past Medical History  Diagnosis Date  . Coronary artery disease   . Hypertension   . High cholesterol   . Type II diabetes mellitus   . GERD (gastroesophageal reflux disease)   . Arthritis     "lower back" (10/24/2012)    Medications:  Scheduled:  . allopurinol  100 mg Oral Daily  . aspirin EC  325 mg Oral Daily  . heparin  4,000 Units Intravenous Once  . [START ON 10/25/2012] insulin aspart  0-15 Units Subcutaneous TID WC  . linagliptin  5 mg Oral Daily  . [START ON 10/25/2012] metoprolol succinate  100 mg Oral Daily  . niacin  500 mg Oral QHS  . pantoprazole  40 mg Oral QHS  . [START ON 10/25/2012] simvastatin  40 mg Oral q1800  . sodium chloride  3 mL Intravenous Q12H  . sodium chloride  3 mL Intravenous Q12H    Assessment: 67 yr old male was seen at [facility] for chest pain. He was given a cardiac cath that revealed severe CAD. He was transferred to [facility] for consideration of CABG. The CABG is scheduled for Thursday AM. Heparin is to be started with pharmacy to dose. Not much info available at this time. Goal of Therapy:  Heparin level 0.3-0.7 units/ml Monitor platelets by anticoagulation protocol: Yes   Plan:  Heparin bolus 4000 units Heparin drip at 1300 units/hr. Heparin  level and CBC with AM labs.  Eugene Garnet 10/24/2012,9:38 PM

## 2012-10-24 NOTE — H&P (Signed)
[address]       [place] [address]             [phone]             HISTORY AND PHYSICAL     Name: Timothy Murray DOB: 27-Feb-1945 67 y.o. MRN: [id number]   Primary MD:  Bluford Main, MD Cardiologist: Adrian Blackwater, MD   Reason for admission:  Coronary artery disease   HPI: Timothy Murray is a 67 y.o. male who is admitted in transfer from [facility] with severe coronary artery disease.  He relocated to [place] last year from [place], [place] after retiring.  He has no history of CAD, but does have a history of hypertension, hyperlipidemia, tobacco use, and diabetes. Earlier in the year, he had an episode of chest pain which was apparently worked up with a nuclear stress test.  The patient was told the results were negative, and that his chest pain was due to GERD.  He has had no further chest pain until recently.  He and his wife recently traveled to [place] on a 21 day tour, and he reports having one episode of left sided chest pain, non-radiating, which occurred while he was lifting his luggage.  There was no associated nausea, diaphoresis, or dyspnea.  The pain resolved with rest and did not recur during the trip.  However, last Friday, the patient was pushing a cart through the grocery store when he noted pain similar in nature to the previous episode.  He sought care from his primary care MD and was referred to Dr. Welton Flakes for stress testing, which was performed on 10/23/2012, and was notable for chest pain, 3/10.  He then underwent cardiac catheterization today, which showed severe coronary artery disease, including an occluded D1 with thrombus, high grade lesions of the mid and distal LAD, high grade lesions in the mid LCx and OM1, and mild to moderate disease in the RCA. LVEF is 60%.  He was subsequently transferred to [facility] for consideration of CABG.  The patient has been stable and symptom free since catheterization.      Past Medical History:  Hypertension  Hyperlipidemia  Type 2 diabetes mellitus (last Hemoglobin A1C=6.4 per patient)  Gastroesophageal reflux disease   Past Surgical History:  Appendectomy   Social History: History   Social History  . Marital Status: Married    Spouse Name: N/A    Number of Children: N/A  . Years of Education: N/A   Social History Main Topics  . Smoking status: Smoked x 40 years, quit 10 years ago  . Smokeless tobacco: Not on file  . Alcohol Use: Not on file  . Drug Use: Not on file  . Sexual Activity: Not on file   Other Topics Concern  . Not on file   Social History Narrative  . No narrative on file    Family History: Mother - H/o CAD, s/p CABG   Allergies: Latex- itching, rash   Home Medications: Allopurinol 100 mg po daily Metoprolol Succinate ER 100 mg po daily Niacin 500 mg po daily Omeprazole 20 mg po qhs Onglyza 5 mg po daily Simvastatin 40 mg po qhs   Review of Systems:              Cardiac:   Chest Pain [  x  ]  Resting SOB [   ] Exertional SOB  [  ]  Orthopnea [  ]  Pedal Edema [   ]    Palpitations [  ] Syncope  [  ]   Presyncope [  ]  General: Constitional: recent weight change [  ]; anorexia [  ]; fatigue [  ]; nausea[] ; night sweats [  ]; fever [  ]; or chills [  ];                                                                                                                                          Dental: poor dentition[  ] wears upper and lower dentures  Eye : blurred vision [  ]; diplopia [   ]; vision changes [  ];  Amaurosis fugax[  ]; wears glasses Resp: cough [  ];  wheezing[  ];  hemoptysis[  ]; shortness of breath[  ]; paroxysmal nocturnal dyspnea[  ]; dyspnea on exertion[  ]; or orthopnea[  ];  GI:  gallstones[  ], vomiting[  ];  dysphagia[  ]; melena[  ];  hematochezia [  ]; heartburn[ x ];   Hx of  Colonoscopy[  ]; GU: kidney stones [  ]; hematuria[  ];   dysuria [  ];  nocturia[  ];  history of      obstruction [  ];             Skin: rash, swelling[  ];, hair loss[  ];  peripheral edema[  ];  or itching[  ]; Musculosketetal: myalgias[  ];  joint swelling[  ];  joint erythema[  ];  joint pain[x ];  back pain[ x ];  Heme/Lymph: bruising[  ];  bleeding[  ];  anemia[  ];  Neuro: TIA[  ];  headaches[  ];  stroke[  ];  vertigo[  ];  seizures[  ];   paresthesias[  ];  difficulty walking[  ];  Psych:depression[  ]; anxiety[  ];  Endocrine: diabetes[ x ];  thyroid dysfunction[  ];     Physical Exam: There were no vitals filed for this visit.  General: No acute distress HEENT: Normocephalic, atraumatic.  Oropharynx clear, dentures in place. Heart: Regular rate and rhythm without murmurs, rubs, or gallops. Lungs: Clear to auscultation. Abdomen: Soft, nontender, nondistended.  Bowel sounds active in all quadrants. Neurologic: Grossly intact.  No obvious deficits. Extremities: Right groin with closure device in place, no hematoma.  Palpable femoral and pedal pulses.  No edema.    Labs: No results found for this basename: WBC, HGB, HCT, MCV, PLT   No results found for this basename: INR, PROTIME   No results found for this basename: na, k, cl, co2, glucose, bun, creatinine, calcium, gfrnonaa, gfraa    Imaging: No results found.   Assessment/Plan: The patient is a 67 year old male with severe 3 vessel CAD not amenable to percutaneous intervention. He will be admitted to [facility] for consideration of CABG. He currently  is stable and asymptomatic.  Dr. Dorris Fetch with see the patient and review his studies this evening.    Adella Hare, PA-C 10/24/2012 5:54 PM

## 2012-10-25 ENCOUNTER — Inpatient Hospital Stay (HOSPITAL_COMMUNITY): Payer: Medicare Other

## 2012-10-25 ENCOUNTER — Other Ambulatory Visit: Payer: Self-pay | Admitting: *Deleted

## 2012-10-25 DIAGNOSIS — Z01810 Encounter for preprocedural cardiovascular examination: Secondary | ICD-10-CM

## 2012-10-25 DIAGNOSIS — I2510 Atherosclerotic heart disease of native coronary artery without angina pectoris: Secondary | ICD-10-CM

## 2012-10-25 LAB — CBC
Hemoglobin: 16.5 g/dL (ref 13.0–17.0)
MCH: 32.2 pg (ref 26.0–34.0)
MCHC: 35.4 g/dL (ref 30.0–36.0)
MCV: 90.8 fL (ref 78.0–100.0)
RBC: 5.13 MIL/uL (ref 4.22–5.81)

## 2012-10-25 LAB — ABO/RH: ABO/RH(D): A POS

## 2012-10-25 LAB — URINALYSIS, ROUTINE W REFLEX MICROSCOPIC
Glucose, UA: NEGATIVE mg/dL
Hgb urine dipstick: NEGATIVE
Ketones, ur: NEGATIVE mg/dL
Leukocytes, UA: NEGATIVE
Nitrite: NEGATIVE
Protein, ur: NEGATIVE mg/dL
pH: 5.5 (ref 5.0–8.0)

## 2012-10-25 LAB — GLUCOSE, CAPILLARY
Glucose-Capillary: 113 mg/dL — ABNORMAL HIGH (ref 70–99)
Glucose-Capillary: 127 mg/dL — ABNORMAL HIGH (ref 70–99)

## 2012-10-25 LAB — BASIC METABOLIC PANEL
Calcium: 8.6 mg/dL (ref 8.4–10.5)
GFR calc Af Amer: 85 mL/min — ABNORMAL LOW (ref >90)
GFR calc non Af Amer: 74 mL/min — ABNORMAL LOW (ref >90)
Glucose, Bld: 185 mg/dL — ABNORMAL HIGH (ref 70–99)
Potassium: 3.5 mEq/L (ref 3.5–5.1)
Sodium: 139 mEq/L (ref 135–145)

## 2012-10-25 LAB — RAPID STREP SCREEN (MED CTR MEBANE ONLY): Streptococcus, Group A Screen (Direct): NEGATIVE

## 2012-10-25 LAB — HEMOGLOBIN A1C
Hgb A1c MFr Bld: 6.3 % — ABNORMAL HIGH (ref <5.7)
Mean Plasma Glucose: 134 mg/dL — ABNORMAL HIGH (ref <117)

## 2012-10-25 MED ORDER — VANCOMYCIN HCL 10 G IV SOLR
1500.0000 mg | INTRAVENOUS | Status: AC
  Administered 2012-10-26: 1500 mg via INTRAVENOUS
  Filled 2012-10-25: qty 1500

## 2012-10-25 MED ORDER — METOPROLOL TARTRATE 12.5 MG HALF TABLET
12.5000 mg | ORAL_TABLET | Freq: Once | ORAL | Status: AC
  Administered 2012-10-26: 12.5 mg via ORAL
  Filled 2012-10-25: qty 1

## 2012-10-25 MED ORDER — MENTHOL 3 MG MT LOZG
1.0000 | LOZENGE | OROMUCOSAL | Status: DC | PRN
  Filled 2012-10-25: qty 9

## 2012-10-25 MED ORDER — AZITHROMYCIN 500 MG PO TABS
500.0000 mg | ORAL_TABLET | Freq: Every day | ORAL | Status: AC
  Administered 2012-10-25: 500 mg via ORAL
  Filled 2012-10-25 (×2): qty 1

## 2012-10-25 MED ORDER — BISACODYL 5 MG PO TBEC
5.0000 mg | DELAYED_RELEASE_TABLET | Freq: Once | ORAL | Status: AC
  Administered 2012-10-25: 5 mg via ORAL
  Filled 2012-10-25: qty 1

## 2012-10-25 MED ORDER — CHLORHEXIDINE GLUCONATE 4 % EX LIQD
60.0000 mL | Freq: Once | CUTANEOUS | Status: AC
  Administered 2012-10-26: 4 via TOPICAL
  Filled 2012-10-25: qty 60

## 2012-10-25 MED ORDER — SODIUM CHLORIDE 0.9 % IV SOLN
INTRAVENOUS | Status: DC
  Filled 2012-10-25: qty 40

## 2012-10-25 MED ORDER — EPINEPHRINE HCL 1 MG/ML IJ SOLN
0.5000 ug/min | INTRAVENOUS | Status: DC
  Filled 2012-10-25: qty 4

## 2012-10-25 MED ORDER — SODIUM CHLORIDE 0.9 % IV SOLN
INTRAVENOUS | Status: DC
  Filled 2012-10-25: qty 30

## 2012-10-25 MED ORDER — NITROGLYCERIN IN D5W 200-5 MCG/ML-% IV SOLN
2.0000 ug/min | INTRAVENOUS | Status: DC
  Filled 2012-10-25: qty 250

## 2012-10-25 MED ORDER — CEFUROXIME SODIUM 750 MG IJ SOLR
750.0000 mg | INTRAMUSCULAR | Status: DC
  Filled 2012-10-25: qty 750

## 2012-10-25 MED ORDER — DEXMEDETOMIDINE HCL IN NACL 400 MCG/100ML IV SOLN
0.1000 ug/kg/h | INTRAVENOUS | Status: DC
  Filled 2012-10-25: qty 100

## 2012-10-25 MED ORDER — ALBUTEROL SULFATE (5 MG/ML) 0.5% IN NEBU
2.5000 mg | INHALATION_SOLUTION | Freq: Once | RESPIRATORY_TRACT | Status: AC
  Administered 2012-10-25: 2.5 mg via RESPIRATORY_TRACT

## 2012-10-25 MED ORDER — TEMAZEPAM 15 MG PO CAPS: 15.0000 mg | ORAL_CAPSULE | Freq: Once | ORAL | Status: AC | PRN

## 2012-10-25 MED ORDER — CEFUROXIME SODIUM 1.5 G IJ SOLR
1.5000 g | INTRAMUSCULAR | Status: AC
  Administered 2012-10-26: .75 g via INTRAVENOUS
  Administered 2012-10-26: 1.5 g via INTRAVENOUS
  Filled 2012-10-25: qty 1.5

## 2012-10-25 MED ORDER — CHLORHEXIDINE GLUCONATE 4 % EX LIQD
60.0000 mL | Freq: Once | CUTANEOUS | Status: AC
  Administered 2012-10-25: 4 via TOPICAL
  Filled 2012-10-25 (×2): qty 60

## 2012-10-25 MED ORDER — MAGNESIUM SULFATE 50 % IJ SOLN
40.0000 meq | INTRAMUSCULAR | Status: DC
  Filled 2012-10-25: qty 10

## 2012-10-25 MED ORDER — ALPRAZOLAM 0.25 MG PO TABS: 0.2500 mg | ORAL_TABLET | ORAL | Status: DC | PRN

## 2012-10-25 MED ORDER — PLASMA-LYTE 148 IV SOLN
INTRAVENOUS | Status: AC
  Administered 2012-10-26: 09:00:00
  Filled 2012-10-25: qty 2.5

## 2012-10-25 MED ORDER — POTASSIUM CHLORIDE 2 MEQ/ML IV SOLN
80.0000 meq | INTRAVENOUS | Status: DC
  Filled 2012-10-25: qty 40

## 2012-10-25 MED ORDER — NIACIN ER 500 MG PO CPCR
500.0000 mg | ORAL_CAPSULE | Freq: Every day | ORAL | Status: DC
  Administered 2012-10-25: 500 mg via ORAL
  Filled 2012-10-25 (×2): qty 1

## 2012-10-25 MED ORDER — DOPAMINE-DEXTROSE 3.2-5 MG/ML-% IV SOLN
2.0000 ug/kg/min | INTRAVENOUS | Status: DC
  Filled 2012-10-25: qty 250

## 2012-10-25 MED ORDER — SODIUM CHLORIDE 0.9 % IV SOLN
INTRAVENOUS | Status: DC
  Filled 2012-10-25: qty 1

## 2012-10-25 MED ORDER — DEXTROSE 5 % IV SOLN
30.0000 ug/min | INTRAVENOUS | Status: AC
  Administered 2012-10-26: 6.67 ug/min via INTRAVENOUS
  Filled 2012-10-25: qty 2

## 2012-10-25 NOTE — Progress Notes (Addendum)
VASCULAR LAB PRELIMINARY  PRELIMINARY  PRELIMINARY  PRELIMINARY  Pre-op Cardiac Surgery  Carotid Findings:  Bilateral:  1-39% ICA stenosis.  Vertebral artery flow is antegrade.     Thereasa Parkin, RVT 10/25/2012 1:53 PM    Upper Extremity Right Left  Brachial Pressures 124 Triphasic 127 Triphasic  Radial Waveforms Triphasic Triphasic  Ulnar Waveforms Triphasic Triphasic  Palmar Arch (Allen's Test) Normal Normal   Findings:  Dopplers remained normal bilaterally with both radial and ulnar compressions    Lower  Extremity Right Left  Dorsalis Pedis 135 Biphasic 142 Triphasic  Posterior Tibial 148 Biphasic 144 Biphasic  Ankle/Brachial Indices 1.17 1.13    Findings:  ABIs and Doppler waveforms are within normal limits bilaterally at rest   SLAUGHTER, [place], RVS 10/25/2012, 12:03 PM

## 2012-10-25 NOTE — Progress Notes (Signed)
UR Completed.  Edyth Glomb Jane [phone] 10/25/2012  

## 2012-10-25 NOTE — Progress Notes (Signed)
ANTICOAGULATION CONSULT NOTE - Follow-up Consult  Pharmacy Consult for Heparin Indication: chest pain/ACS  Allergies  Allergen Reactions  . Soap & Cleansers     "liquid soap" antibacterial -rash  . Latex Rash    Patient Measurements: Height: 6' (182.9 cm) Weight: 198 lb 10.2 oz (90.1 kg) IBW/kg (Calculated) : 77.6  Vital Signs: Temp: 98.4 F (36.9 C) (10/22 0904) Temp src: Oral (10/22 0904) BP: 141/82 mmHg (10/22 0904) Pulse Rate: 69 (10/22 0904)  Labs:  Recent Labs  10/24/12 2318 10/25/12 0710  HGB 14.9 16.5  HCT 42.1 46.6  PLT 146* 158  HEPARINUNFRC  --  0.43  CREATININE 1.03  --     Estimated Creatinine Clearance: 77.4 ml/min (by C-G formula based on Cr of 1.03).  Assessment: 67 yr old male was seen at [facility] for chest pain. S/p cardiac cath that revealed severe CAD. He was transferred to [facility] for a CABG which is scheduled for Thursday AM. Heparin level 0.43 (therapeutic) on 1300 units/hr. No bleeding noted. CBC remains stable.  Goal of Therapy:  Heparin level 0.3-0.7 units/ml Monitor platelets by anticoagulation protocol: Yes   Plan:  1) Continue Heparin drip at 1300 units/hr. 2) Heparin level and CBC daily  Christoper Fabian, PharmD, BCPS Clinical pharmacist, pager [phone] 10/25/2012,11:44 AM

## 2012-10-25 NOTE — Progress Notes (Addendum)
[address]       [place] [address]             [phone]           Subjective: Feels ok, without SOB/Chest pain, does have a sore throat  Objective  Telemetry sinus rhythm  Temp:  [98 F (36.7 C)-98.3 F (36.8 C)] 98 F (36.7 C) (10/22 0627) Pulse Rate:  [63-66] 66 (10/22 0627) Resp:  [17-18] 17 (10/22 0627) BP: (136-137)/(71-79) 136/71 mmHg (10/22 0627) SpO2:  [95 %-98 %] 98 % (10/22 0627) Weight:  [198 lb 10.2 oz (90.1 kg)-201 lb (91.173 kg)] 198 lb 10.2 oz (90.1 kg) (10/22 8295)   Intake/Output Summary (Last 24 hours) at 10/25/12 0758 Last data filed at 10/25/12 0600  Gross per 24 hour  Intake  177.5 ml  Output      0 ml  Net  177.5 ml       General appearance: alert, cooperative and no distress Heart: regular rate and rhythm and S1, S2 normal Lungs: clear to auscultation bilaterally Extremities: no edema pharynx- mild erethema without exudate  Lab Results:  Recent Labs  10/24/12 2318  NA 139  K 3.5  CL 104  CO2 26  GLUCOSE 185*  BUN 16  CREATININE 1.03  CALCIUM 8.6   No results found for this basename: AST, ALT, ALKPHOS, BILITOT, PROT, ALBUMIN,  in the last 72 hours No results found for this basename: LIPASE, AMYLASE,  in the last 72 hours  Recent Labs  10/24/12 2318 10/25/12 0710  WBC 7.4 6.8  HGB 14.9 16.5  HCT 42.1 46.6  MCV 92.5 90.8  PLT 146* 158   No results found for this basename: CKTOTAL, CKMB, TROPONINI,  in the last 72 hours No components found with this basename: POCBNP,  No results found for this basename: DDIMER,  in the last 72 hours No results found for this basename: HGBA1C,  in the last 72 hours No results found for this basename: CHOL, HDL, LDLCALC, TRIG, CHOLHDL,  in the last 72 hours No results found for this basename: TSH, T4TOTAL, FREET3, T3FREE, THYROIDAB,  in the last 72 hours No results found for this basename: VITAMINB12, FOLATE, FERRITIN, TIBC, IRON, RETICCTPCT,  in the last 72  hours  Medications: Scheduled . allopurinol  100 mg Oral Daily  . aspirin EC  325 mg Oral Daily  . insulin aspart  0-15 Units Subcutaneous TID WC  . linagliptin  5 mg Oral Daily  . metoprolol succinate  100 mg Oral Daily  . niacin  500 mg Oral QHS  . pantoprazole  40 mg Oral QHS  . simvastatin  40 mg Oral q1800  . sodium chloride  3 mL Intravenous Q12H  . sodium chloride  3 mL Intravenous Q12H     Radiology/Studies:  X-ray Chest Pa And Lateral   10/24/2012   CLINICAL DATA:  Preop CABG  EXAM: CHEST  2 VIEW  COMPARISON:  None.  FINDINGS: Heart size is normal. Vascularity is normal. Lungs are clear without infiltrate effusion or mass.  IMPRESSION: No active cardiopulmonary disease.   Electronically Signed   By: Marlan Palau M.D.   On: 10/24/2012 22:00    INR: Will add last result for INR, ABG once components are confirmed Will add last 4 CBG results once components are confirmed  Assessment/Plan:  1 Doing well, ready for surgery. 2 will get rapid strep - has had sore throat for a week but  did not tell anyone   LOS: 1 day    GOLD,WAYNE E 10/22/20147:58 AM  Patient seen and examined, agree with above.  His Allen's test is difficult to determine but there appears to be some slow cap refill with radial compression on left  Reviewed cines- no hemodynamically significant stenoses in RCA- needs grafts to LAD, diagonal and OM1  He has had a stable day- no chest pain or SOB  Carotids OK  For CABG in AM - all questions answered

## 2012-10-25 NOTE — Progress Notes (Signed)
Discussed sternal precautions, mobility and CRPII with pt and wife. Gave OHS book and set up pre-op video. Encouraged pt to walk in room and hall. Has not had any CP.  [id number] Timothy Murray CES, ACSM 1:04 PM 10/25/2012

## 2012-10-26 ENCOUNTER — Inpatient Hospital Stay (HOSPITAL_COMMUNITY): Payer: Medicare Other

## 2012-10-26 ENCOUNTER — Encounter (HOSPITAL_COMMUNITY)
Admission: AD | Disposition: A | Discharge status: Home / Self Care | Payer: Medicare Other | Source: Other Acute Inpatient Hospital | Attending: Thoracic Surgery (Cardiothoracic Vascular Surgery)

## 2012-10-26 ENCOUNTER — Encounter (HOSPITAL_COMMUNITY): Payer: Medicare Other | Admitting: Certified Registered Nurse Anesthetist

## 2012-10-26 ENCOUNTER — Inpatient Hospital Stay (HOSPITAL_COMMUNITY): Payer: Medicare Other | Admitting: Certified Registered Nurse Anesthetist

## 2012-10-26 DIAGNOSIS — I2510 Atherosclerotic heart disease of native coronary artery without angina pectoris: Secondary | ICD-10-CM

## 2012-10-26 HISTORY — PX: CORONARY ARTERY BYPASS GRAFT: SHX141

## 2012-10-26 LAB — CBC
HCT: 34.2 % — ABNORMAL LOW (ref 39.0–52.0)
HCT: 30.2 % — ABNORMAL LOW (ref 39.0–52.0)
Hemoglobin: 15.4 g/dL (ref 13.0–17.0)
Hemoglobin: 12.3 g/dL — ABNORMAL LOW (ref 13.0–17.0)
Hemoglobin: 10.8 g/dL — ABNORMAL LOW (ref 13.0–17.0)
MCH: 32.9 pg (ref 26.0–34.0)
MCH: 32.7 pg (ref 26.0–34.0)
MCHC: 35.9 g/dL (ref 30.0–36.0)
MCHC: 36 g/dL (ref 30.0–36.0)
MCHC: 35.8 g/dL (ref 30.0–36.0)
MCV: 91.7 fL (ref 78.0–100.0)
MCV: 91 fL (ref 78.0–100.0)
MCV: 91.5 fL (ref 78.0–100.0)
Platelets: 95 K/uL — ABNORMAL LOW (ref 150–400)
RBC: 4.68 MIL/uL (ref 4.22–5.81)
RBC: 3.3 MIL/uL — ABNORMAL LOW (ref 4.22–5.81)
RDW: 13 % (ref 11.5–15.5)
WBC: 9.6 10*3/uL (ref 4.0–10.5)
WBC: 14.1 10*3/uL — ABNORMAL HIGH (ref 4.0–10.5)
WBC: 10.7 K/uL — ABNORMAL HIGH (ref 4.0–10.5)

## 2012-10-26 LAB — POCT I-STAT 3, ART BLOOD GAS (G3+)
Acid-Base Excess: 1 mmol/L (ref 0.0–2.0)
Acid-base deficit: 1 mmol/L (ref 0.0–2.0)
Bicarbonate: 23.8 mEq/L (ref 20.0–24.0)
Bicarbonate: 25.9 mEq/L — ABNORMAL HIGH (ref 20.0–24.0)
Bicarbonate: 23.7 meq/L (ref 20.0–24.0)
Bicarbonate: 22.7 mEq/L (ref 20.0–24.0)
O2 Saturation: 100 %
O2 Saturation: 98 %
O2 Saturation: 98 %
Patient temperature: 36
Patient temperature: 38.3
TCO2: 27 mmol/L (ref 0–100)
TCO2: 25 mmol/L (ref 0–100)
TCO2: 25 mmol/L (ref 0–100)
TCO2: 24 mmol/L (ref 0–100)
TCO2: 25 mmol/L (ref 0–100)
pCO2 arterial: 44.2 mmHg (ref 35.0–45.0)
pCO2 arterial: 36.7 mmHg (ref 35.0–45.0)
pCO2 arterial: 40.5 mmHg (ref 35.0–45.0)
pCO2 arterial: 36 mmHg (ref 35.0–45.0)
pCO2 arterial: 40.4 mmHg (ref 35.0–45.0)
pCO2 arterial: 40.5 mmHg (ref 35.0–45.0)
pH, Arterial: 7.38 (ref 7.350–7.450)
pH, Arterial: 7.422 (ref 7.350–7.450)
pH, Arterial: 7.363 (ref 7.350–7.450)
pH, Arterial: 7.375 (ref 7.350–7.450)
pO2, Arterial: 520 mmHg — ABNORMAL HIGH (ref 80.0–100.0)
pO2, Arterial: 324 mmHg — ABNORMAL HIGH (ref 80.0–100.0)
pO2, Arterial: 92 mmHg (ref 80.0–100.0)
pO2, Arterial: 110 mmHg — ABNORMAL HIGH (ref 80.0–100.0)

## 2012-10-26 LAB — POCT I-STAT, CHEM 8
BUN: 9 mg/dL (ref 6–23)
Calcium, Ion: 1.1 mmol/L — ABNORMAL LOW (ref 1.13–1.30)
Chloride: 106 meq/L (ref 96–112)
Creatinine, Ser: 1 mg/dL (ref 0.50–1.35)
Glucose, Bld: 164 mg/dL — ABNORMAL HIGH (ref 70–99)
HCT: 30 % — ABNORMAL LOW (ref 39.0–52.0)
Hemoglobin: 10.2 g/dL — ABNORMAL LOW (ref 13.0–17.0)
Potassium: 4.5 meq/L (ref 3.5–5.1)
Sodium: 140 meq/L (ref 135–145)
TCO2: 21 mmol/L (ref 0–100)

## 2012-10-26 LAB — POCT I-STAT 4, (NA,K, GLUC, HGB,HCT)
Glucose, Bld: 121 mg/dL — ABNORMAL HIGH (ref 70–99)
Glucose, Bld: 143 mg/dL — ABNORMAL HIGH (ref 70–99)
Glucose, Bld: 141 mg/dL — ABNORMAL HIGH (ref 70–99)
Glucose, Bld: 182 mg/dL — ABNORMAL HIGH (ref 70–99)
Glucose, Bld: 138 mg/dL — ABNORMAL HIGH (ref 70–99)
HCT: 41 % (ref 39.0–52.0)
HCT: 39 % (ref 39.0–52.0)
HCT: 29 % — ABNORMAL LOW (ref 39.0–52.0)
HCT: 29 % — ABNORMAL LOW (ref 39.0–52.0)
Hemoglobin: 13.3 g/dL (ref 13.0–17.0)
Hemoglobin: 9.9 g/dL — ABNORMAL LOW (ref 13.0–17.0)
Potassium: 3.9 mEq/L (ref 3.5–5.1)
Potassium: 4.2 mEq/L (ref 3.5–5.1)
Potassium: 4.3 mEq/L (ref 3.5–5.1)
Potassium: 4.7 mEq/L (ref 3.5–5.1)
Potassium: 3.8 mEq/L (ref 3.5–5.1)
Sodium: 139 mEq/L (ref 135–145)
Sodium: 136 mEq/L (ref 135–145)
Sodium: 138 mEq/L (ref 135–145)
Sodium: 140 mEq/L (ref 135–145)

## 2012-10-26 LAB — BASIC METABOLIC PANEL
BUN: 14 mg/dL (ref 6–23)
Chloride: 102 mEq/L (ref 96–112)
GFR calc Af Amer: 83 mL/min — ABNORMAL LOW (ref >90)
GFR calc non Af Amer: 71 mL/min — ABNORMAL LOW (ref >90)
Potassium: 4.3 mEq/L (ref 3.5–5.1)
Sodium: 138 mEq/L (ref 135–145)

## 2012-10-26 LAB — GLUCOSE, CAPILLARY
Glucose-Capillary: 141 mg/dL — ABNORMAL HIGH (ref 70–99)
Glucose-Capillary: 91 mg/dL (ref 70–99)
Glucose-Capillary: 134 mg/dL — ABNORMAL HIGH (ref 70–99)
Glucose-Capillary: 164 mg/dL — ABNORMAL HIGH (ref 70–99)
Glucose-Capillary: 143 mg/dL — ABNORMAL HIGH (ref 70–99)
Glucose-Capillary: 118 mg/dL — ABNORMAL HIGH (ref 70–99)
Glucose-Capillary: 96 mg/dL (ref 70–99)
Glucose-Capillary: 89 mg/dL (ref 70–99)

## 2012-10-26 LAB — CREATININE, SERUM
Creatinine, Ser: 0.84 mg/dL (ref 0.50–1.35)
GFR calc Af Amer: >90 mL/min
GFR calc non Af Amer: 89 mL/min — ABNORMAL LOW

## 2012-10-26 LAB — PROTIME-INR
INR: 1.26 (ref 0.00–1.49)
Prothrombin Time: 15.5 seconds — ABNORMAL HIGH (ref 11.6–15.2)

## 2012-10-26 LAB — PLATELET COUNT: Platelets: 124 10*3/uL — ABNORMAL LOW (ref 150–400)

## 2012-10-26 LAB — SURGICAL PCR SCREEN: Staphylococcus aureus: NEGATIVE

## 2012-10-26 LAB — HEPARIN LEVEL (UNFRACTIONATED): Heparin Unfractionated: 0.45 IU/mL (ref 0.30–0.70)

## 2012-10-26 LAB — MAGNESIUM: Magnesium: 3.1 mg/dL — ABNORMAL HIGH (ref 1.5–2.5)

## 2012-10-26 LAB — PREPARE RBC (CROSSMATCH)

## 2012-10-26 LAB — HEMOGLOBIN AND HEMATOCRIT, BLOOD: Hemoglobin: 10.4 g/dL — ABNORMAL LOW (ref 13.0–17.0)

## 2012-10-26 IMAGING — CR DG CHEST 1V PORT
1 series · 1 of 1 positions shown · non-contrast
Comparison: [DATE].

CLINICAL DATA: Post up CABG.

EXAM:
PORTABLE CHEST - 1 VIEW

[AP]
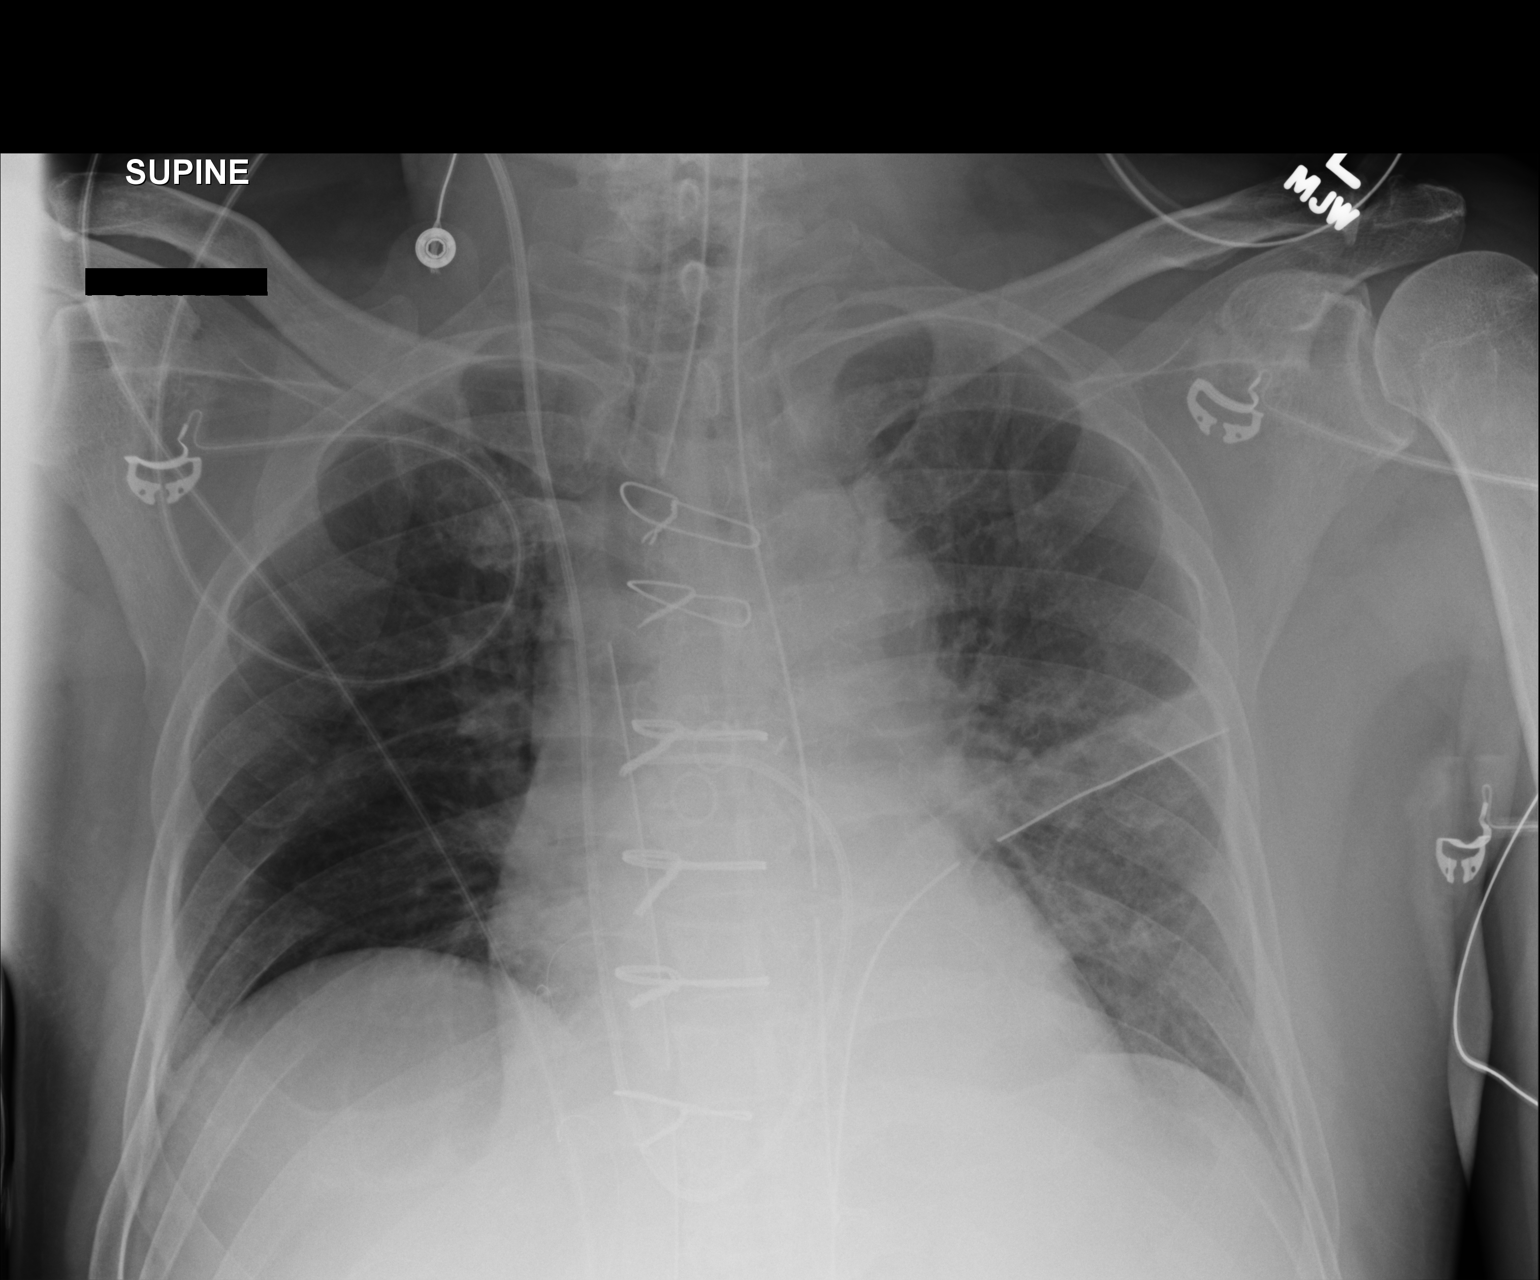

[1 of 1 positions shown; findings below may reference images not displayed]

FINDINGS: [IA] hr. Interval median sternotomy and CABG. Tip of the
endotracheal tube is in the midtrachea. A nasogastric tube projects
below the diaphragm with the tip near the GE junction. Right IJ
Swan-Ganz catheter tip is in the proximal right pulmonary artery.
Left chest tube and mediastinal drain are in place. There is mild
atelectasis at both lung bases. No pneumothorax or significant
pleural effusion is seen.
IMPRESSION: No demonstrated complication following CABG.

## 2012-10-26 SURGERY — CORONARY ARTERY BYPASS GRAFTING (CABG)
Anesthesia: General | Site: Chest | Wound class: Clean

## 2012-10-26 MED ORDER — MIDAZOLAM HCL 2 MG/2ML IJ SOLN: 2.0000 mg | INTRAMUSCULAR | Status: DC | PRN

## 2012-10-26 MED ORDER — ALBUMIN HUMAN 5 % IV SOLN
250.0000 mL | INTRAVENOUS | Status: AC | PRN
  Administered 2012-10-26 (×3): 250 mL via INTRAVENOUS
  Filled 2012-10-26: qty 250

## 2012-10-26 MED ORDER — LACTATED RINGERS IV SOLN
INTRAVENOUS | Status: DC | PRN
  Administered 2012-10-26 (×2): via INTRAVENOUS

## 2012-10-26 MED ORDER — OXYCODONE HCL 5 MG PO TABS
5.0000 mg | ORAL_TABLET | ORAL | Status: DC | PRN
  Administered 2012-10-27 (×2): 10 mg via ORAL
  Administered 2012-10-27: 5 mg via ORAL
  Administered 2012-10-28 – 2012-10-30 (×3): 10 mg via ORAL
  Filled 2012-10-26: qty 1
  Filled 2012-10-26 (×5): qty 2

## 2012-10-26 MED ORDER — POTASSIUM CHLORIDE 10 MEQ/50ML IV SOLN
10.0000 meq | INTRAVENOUS | Status: AC
  Administered 2012-10-26 (×3): 10 meq via INTRAVENOUS

## 2012-10-26 MED ORDER — 0.9 % SODIUM CHLORIDE (POUR BTL) OPTIME
TOPICAL | Status: DC | PRN
  Administered 2012-10-26: 1000 mL

## 2012-10-26 MED ORDER — FENTANYL CITRATE 0.05 MG/ML IJ SOLN
INTRAMUSCULAR | Status: DC | PRN
  Administered 2012-10-26: 250 ug via INTRAVENOUS
  Administered 2012-10-26: 100 ug via INTRAVENOUS
  Administered 2012-10-26 (×2): 250 ug via INTRAVENOUS
  Administered 2012-10-26: 400 ug via INTRAVENOUS
  Administered 2012-10-26: 250 ug via INTRAVENOUS

## 2012-10-26 MED ORDER — DOCUSATE SODIUM 100 MG PO CAPS
200.0000 mg | ORAL_CAPSULE | Freq: Every day | ORAL | Status: DC
  Administered 2012-10-27 – 2012-10-28 (×2): 200 mg via ORAL
  Filled 2012-10-26 (×4): qty 2

## 2012-10-26 MED ORDER — PROTAMINE SULFATE 10 MG/ML IV SOLN
INTRAVENOUS | Status: DC | PRN
  Administered 2012-10-26: 240 mg via INTRAVENOUS
  Administered 2012-10-26: 10 mg via INTRAVENOUS

## 2012-10-26 MED ORDER — POTASSIUM CHLORIDE 10 MEQ/50ML IV SOLN: 10.0000 meq | INTRAVENOUS | Status: AC

## 2012-10-26 MED ORDER — LIDOCAINE HCL (CARDIAC) 20 MG/ML IV SOLN
INTRAVENOUS | Status: DC | PRN
  Administered 2012-10-26: 100 mg via INTRAVENOUS

## 2012-10-26 MED ORDER — HEPARIN SODIUM (PORCINE) 1000 UNIT/ML IJ SOLN
INTRAMUSCULAR | Status: DC | PRN
  Administered 2012-10-26: 28 mL via INTRAVENOUS
  Administered 2012-10-26: 2 mL via INTRAVENOUS

## 2012-10-26 MED ORDER — ROCURONIUM BROMIDE 100 MG/10ML IV SOLN
INTRAVENOUS | Status: DC | PRN
  Administered 2012-10-26: 80 mg via INTRAVENOUS
  Administered 2012-10-26: 50 mg via INTRAVENOUS
  Administered 2012-10-26: 70 mg via INTRAVENOUS

## 2012-10-26 MED ORDER — ASPIRIN 81 MG PO CHEW: 324.0000 mg | CHEWABLE_TABLET | Freq: Every day | ORAL | Status: DC

## 2012-10-26 MED ORDER — PHENYLEPHRINE HCL 10 MG/ML IJ SOLN
0.0000 ug/min | INTRAVENOUS | Status: DC
  Filled 2012-10-26 (×2): qty 2

## 2012-10-26 MED ORDER — INSULIN REGULAR BOLUS VIA INFUSION
0.0000 [IU] | Freq: Three times a day (TID) | INTRAVENOUS | Status: AC
  Administered 2012-10-27: 4 [IU] via INTRAVENOUS
  Filled 2012-10-26: qty 10

## 2012-10-26 MED ORDER — LACTATED RINGERS IV SOLN
INTRAVENOUS | Status: DC | PRN
  Administered 2012-10-26: 07:00:00 via INTRAVENOUS

## 2012-10-26 MED ORDER — ASPIRIN EC 325 MG PO TBEC
325.0000 mg | DELAYED_RELEASE_TABLET | Freq: Every day | ORAL | Status: DC
  Administered 2012-10-27 – 2012-10-30 (×4): 325 mg via ORAL
  Filled 2012-10-26 (×4): qty 1

## 2012-10-26 MED ORDER — SODIUM CHLORIDE 0.9 % IJ SOLN
OROMUCOSAL | Status: DC | PRN
  Administered 2012-10-26: 09:00:00 via TOPICAL

## 2012-10-26 MED ORDER — SODIUM CHLORIDE 0.9 % IV SOLN
10.0000 g | INTRAVENOUS | Status: DC | PRN
  Administered 2012-10-26: 5 g/h via INTRAVENOUS

## 2012-10-26 MED ORDER — SODIUM CHLORIDE 0.9 % IV SOLN
100.0000 [IU] | INTRAVENOUS | Status: DC | PRN
  Administered 2012-10-26: 1.8 [IU]/h via INTRAVENOUS

## 2012-10-26 MED ORDER — PHENYLEPHRINE HCL 10 MG/ML IJ SOLN
10.0000 mg | INTRAVENOUS | Status: DC | PRN
  Administered 2012-10-26: 40 ug/min via INTRAVENOUS

## 2012-10-26 MED ORDER — PROPOFOL 10 MG/ML IV BOLUS
INTRAVENOUS | Status: DC | PRN
  Administered 2012-10-26: 80 mg via INTRAVENOUS

## 2012-10-26 MED ORDER — PANTOPRAZOLE SODIUM 40 MG PO TBEC
40.0000 mg | DELAYED_RELEASE_TABLET | Freq: Every day | ORAL | Status: DC
  Administered 2012-10-28 – 2012-10-30 (×3): 40 mg via ORAL
  Filled 2012-10-26 (×4): qty 1

## 2012-10-26 MED ORDER — METOPROLOL TARTRATE 1 MG/ML IV SOLN: 2.5000 mg | INTRAVENOUS | Status: DC | PRN

## 2012-10-26 MED ORDER — ONDANSETRON HCL 4 MG/2ML IJ SOLN: 4.0000 mg | Freq: Four times a day (QID) | INTRAMUSCULAR | Status: DC | PRN

## 2012-10-26 MED ORDER — METOCLOPRAMIDE HCL 5 MG/ML IJ SOLN
10.0000 mg | Freq: Four times a day (QID) | INTRAMUSCULAR | Status: AC
  Administered 2012-10-26 – 2012-10-27 (×4): 10 mg via INTRAVENOUS
  Filled 2012-10-26 (×4): qty 2

## 2012-10-26 MED ORDER — VANCOMYCIN HCL IN DEXTROSE 1-5 GM/200ML-% IV SOLN
1000.0000 mg | Freq: Once | INTRAVENOUS | Status: AC
  Administered 2012-10-26: 1000 mg via INTRAVENOUS
  Filled 2012-10-26: qty 200

## 2012-10-26 MED ORDER — SODIUM CHLORIDE 0.9 % IJ SOLN: 3.0000 mL | INTRAMUSCULAR | Status: DC | PRN

## 2012-10-26 MED ORDER — MAGNESIUM SULFATE 40 MG/ML IJ SOLN
4.0000 g | Freq: Once | INTRAMUSCULAR | Status: AC
  Administered 2012-10-26: 4 g via INTRAVENOUS
  Filled 2012-10-26: qty 100

## 2012-10-26 MED ORDER — FAMOTIDINE IN NACL 20-0.9 MG/50ML-% IV SOLN
20.0000 mg | Freq: Two times a day (BID) | INTRAVENOUS | Status: AC
  Administered 2012-10-26 – 2012-10-27 (×2): 20 mg via INTRAVENOUS
  Filled 2012-10-26: qty 50

## 2012-10-26 MED ORDER — ACETAMINOPHEN 500 MG PO TABS
1000.0000 mg | ORAL_TABLET | Freq: Four times a day (QID) | ORAL | Status: DC
  Administered 2012-10-27 – 2012-10-30 (×14): 1000 mg via ORAL
  Filled 2012-10-26 (×16): qty 2

## 2012-10-26 MED ORDER — SODIUM CHLORIDE 0.9 % IV SOLN
INTRAVENOUS | Status: AC
  Filled 2012-10-26 (×3): qty 1

## 2012-10-26 MED ORDER — LACTATED RINGERS IV SOLN
INTRAVENOUS | Status: DC
  Administered 2012-10-27: 20 mL/h via INTRAVENOUS

## 2012-10-26 MED ORDER — MORPHINE SULFATE 2 MG/ML IJ SOLN: 1.0000 mg | INTRAMUSCULAR | Status: AC | PRN

## 2012-10-26 MED ORDER — METOPROLOL TARTRATE 12.5 MG HALF TABLET
12.5000 mg | ORAL_TABLET | Freq: Two times a day (BID) | ORAL | Status: DC
  Administered 2012-10-28: 12.5 mg via ORAL
  Filled 2012-10-26 (×5): qty 1

## 2012-10-26 MED ORDER — SODIUM CHLORIDE 0.9 % IV SOLN
200.0000 ug | INTRAVENOUS | Status: DC | PRN
  Administered 2012-10-26: 0.2 ug/kg/h via INTRAVENOUS

## 2012-10-26 MED ORDER — NITROGLYCERIN IN D5W 200-5 MCG/ML-% IV SOLN
INTRAVENOUS | Status: DC | PRN
  Administered 2012-10-26: 16.6 ug/min via INTRAVENOUS

## 2012-10-26 MED ORDER — SODIUM CHLORIDE 0.9 % IV SOLN: 250.0000 mL | INTRAVENOUS | Status: DC

## 2012-10-26 MED ORDER — SODIUM CHLORIDE 0.45 % IV SOLN: INTRAVENOUS | Status: DC

## 2012-10-26 MED ORDER — ACETAMINOPHEN 650 MG RE SUPP
650.0000 mg | Freq: Once | RECTAL | Status: AC
  Administered 2012-10-26: 650 mg via RECTAL

## 2012-10-26 MED ORDER — PHENYLEPHRINE HCL 10 MG/ML IJ SOLN
30.0000 ug/min | INTRAVENOUS | Status: DC
  Filled 2012-10-26: qty 2

## 2012-10-26 MED ORDER — LACTATED RINGERS IV SOLN: 500.0000 mL | Freq: Once | INTRAVENOUS | Status: AC | PRN

## 2012-10-26 MED ORDER — SODIUM CHLORIDE 0.9 % IJ SOLN
3.0000 mL | Freq: Two times a day (BID) | INTRAMUSCULAR | Status: DC
  Administered 2012-10-27 – 2012-10-28 (×2): 3 mL via INTRAVENOUS

## 2012-10-26 MED ORDER — MORPHINE SULFATE 2 MG/ML IJ SOLN
2.0000 mg | INTRAMUSCULAR | Status: DC | PRN
  Administered 2012-10-26 (×2): 4 mg via INTRAVENOUS
  Administered 2012-10-27: 2 mg via INTRAVENOUS
  Administered 2012-10-27: 4 mg via INTRAVENOUS
  Administered 2012-10-27 – 2012-10-28 (×7): 2 mg via INTRAVENOUS
  Filled 2012-10-26 (×7): qty 1
  Filled 2012-10-26: qty 2
  Filled 2012-10-26: qty 1
  Filled 2012-10-26 (×2): qty 2

## 2012-10-26 MED ORDER — NITROGLYCERIN IN D5W 200-5 MCG/ML-% IV SOLN: 0.0000 ug/min | INTRAVENOUS | Status: DC

## 2012-10-26 MED ORDER — HEMOSTATIC AGENTS (NO CHARGE) OPTIME
TOPICAL | Status: DC | PRN
  Administered 2012-10-26: 1 via TOPICAL

## 2012-10-26 MED ORDER — ACETAMINOPHEN 160 MG/5ML PO SOLN: 1000.0000 mg | Freq: Four times a day (QID) | ORAL | Status: DC

## 2012-10-26 MED ORDER — DEXMEDETOMIDINE HCL IN NACL 200 MCG/50ML IV SOLN
0.4000 ug/kg/h | INTRAVENOUS | Status: DC
  Filled 2012-10-26: qty 50

## 2012-10-26 MED ORDER — MIDAZOLAM HCL 5 MG/5ML IJ SOLN
INTRAMUSCULAR | Status: DC | PRN
  Administered 2012-10-26 (×8): 2 mg via INTRAVENOUS
  Administered 2012-10-26: 4 mg via INTRAVENOUS

## 2012-10-26 MED ORDER — SODIUM CHLORIDE 0.9 % IV SOLN
INTRAVENOUS | Status: DC | PRN
  Administered 2012-10-26: 12:00:00 via INTRAVENOUS

## 2012-10-26 MED ORDER — BISACODYL 5 MG PO TBEC
10.0000 mg | DELAYED_RELEASE_TABLET | Freq: Every day | ORAL | Status: DC
  Administered 2012-10-27 – 2012-10-30 (×4): 10 mg via ORAL
  Filled 2012-10-26 (×3): qty 2

## 2012-10-26 MED ORDER — DEXMEDETOMIDINE HCL IN NACL 200 MCG/50ML IV SOLN: 0.1000 ug/kg/h | INTRAVENOUS | Status: DC

## 2012-10-26 MED ORDER — SODIUM CHLORIDE 0.9 % IV SOLN: INTRAVENOUS | Status: DC

## 2012-10-26 MED ORDER — ACETAMINOPHEN 160 MG/5ML PO SOLN: 650.0000 mg | Freq: Once | ORAL | Status: AC

## 2012-10-26 MED ORDER — BISACODYL 10 MG RE SUPP: 10.0000 mg | Freq: Every day | RECTAL | Status: DC

## 2012-10-26 MED ORDER — DEXTROSE 5 % IV SOLN
1.5000 g | Freq: Two times a day (BID) | INTRAVENOUS | Status: AC
  Administered 2012-10-26 – 2012-10-28 (×4): 1.5 g via INTRAVENOUS
  Filled 2012-10-26 (×4): qty 1.5

## 2012-10-26 MED ORDER — METOPROLOL TARTRATE 25 MG/10 ML ORAL SUSPENSION
12.5000 mg | Freq: Two times a day (BID) | ORAL | Status: DC
  Filled 2012-10-26 (×5): qty 5

## 2012-10-26 MED FILL — Sodium Chloride IV Soln 0.9%: INTRAVENOUS | Qty: 1000 | Status: AC

## 2012-10-26 MED FILL — Heparin Sodium (Porcine) Inj 1000 Unit/ML: INTRAMUSCULAR | Qty: 20 | Status: AC

## 2012-10-26 MED FILL — Electrolyte-R (PH 7.4) Solution: INTRAVENOUS | Qty: 4000 | Status: AC

## 2012-10-26 MED FILL — Mannitol IV Soln 20%: INTRAVENOUS | Qty: 500 | Status: AC

## 2012-10-26 MED FILL — Sodium Bicarbonate IV Soln 8.4%: INTRAVENOUS | Qty: 50 | Status: AC

## 2012-10-26 MED FILL — Sodium Chloride Irrigation Soln 0.9%: Qty: 3000 | Status: AC

## 2012-10-26 SURGICAL SUPPLY — 90 items
ATTRACTOMAT 16X20 MAGNETIC DRP (DRAPES) ×2 IMPLANT
BAG DECANTER FOR FLEXI CONT (MISCELLANEOUS) ×2 IMPLANT
BANDAGE ELASTIC 4 VELCRO ST LF (GAUZE/BANDAGES/DRESSINGS) ×2 IMPLANT
BANDAGE ELASTIC 6 VELCRO ST LF (GAUZE/BANDAGES/DRESSINGS) ×2 IMPLANT
BANDAGE GAUZE ELAST BULKY 4 IN (GAUZE/BANDAGES/DRESSINGS) ×2 IMPLANT
BASKET HEART (ORDER IN 25'S) (MISCELLANEOUS) ×1
BASKET HEART (ORDER IN 25S) (MISCELLANEOUS) ×1 IMPLANT
BLADE STERNUM SYSTEM 6 (BLADE) ×2 IMPLANT
CANISTER SUCTION 2500CC (MISCELLANEOUS) ×2 IMPLANT
CANNULA EZ GLIDE AORTIC 21FR (CANNULA) ×2 IMPLANT
CANNULA VENOUS LOW PROF 34X46 (CANNULA) IMPLANT
CANNULA VESSEL W/WING W/VALVE (CANNULA) ×6 IMPLANT
CATH CPB KIT HENDRICKSON (MISCELLANEOUS) ×2 IMPLANT
CATH ROBINSON RED A/P 18FR (CATHETERS) IMPLANT
CATH THORACIC 36FR (CATHETERS) ×2 IMPLANT
CATH THORACIC 36FR RT ANG (CATHETERS) ×2 IMPLANT
CLIP TI MEDIUM 24 (CLIP) IMPLANT
CLIP TI WIDE RED SMALL 24 (CLIP) ×2 IMPLANT
COVER SURGICAL LIGHT HANDLE (MISCELLANEOUS) ×2 IMPLANT
CRADLE DONUT ADULT HEAD (MISCELLANEOUS) ×2 IMPLANT
DRAPE CARDIOVASCULAR INCISE (DRAPES) ×1
DRAPE SLUSH/WARMER DISC (DRAPES) ×2 IMPLANT
DRAPE SRG 135X102X78XABS (DRAPES) ×1 IMPLANT
DRSG COVADERM 4X14 (GAUZE/BANDAGES/DRESSINGS) ×2 IMPLANT
ELECT REM PT RETURN 9FT ADLT (ELECTROSURGICAL) ×4
ELECTRODE REM PT RTRN 9FT ADLT (ELECTROSURGICAL) ×2 IMPLANT
GLOVE BIOGEL PI IND STRL 6 (GLOVE) ×3 IMPLANT
GLOVE BIOGEL PI INDICATOR 6 (GLOVE) ×3
GLOVE EUDERMIC 7 POWDERFREE (GLOVE) IMPLANT
GLOVE SURG SS PI 6.0 STRL IVOR (GLOVE) ×6 IMPLANT
GLOVE SURG SS PI 6.5 STRL IVOR (GLOVE) ×8 IMPLANT
GLOVE SURG SS PI 7.0 STRL IVOR (GLOVE) ×8 IMPLANT
GLOVE SURG SS PI 7.5 STRL IVOR (GLOVE) ×6 IMPLANT
GOWN PREVENTION PLUS XLARGE (GOWN DISPOSABLE) ×4 IMPLANT
GOWN STRL NON-REIN LRG LVL3 (GOWN DISPOSABLE) ×14 IMPLANT
HEMOSTAT POWDER SURGIFOAM 1G (HEMOSTASIS) ×6 IMPLANT
HEMOSTAT SURGICEL 2X14 (HEMOSTASIS) ×2 IMPLANT
INSERT FOGARTY XLG (MISCELLANEOUS) IMPLANT
KIT BASIN OR (CUSTOM PROCEDURE TRAY) ×2 IMPLANT
KIT ROOM TURNOVER OR (KITS) ×2 IMPLANT
KIT SUCTION CATH 14FR (SUCTIONS) ×4 IMPLANT
KIT VASOVIEW W/TROCAR VH 2000 (KITS) ×2 IMPLANT
MARKER GRAFT CORONARY BYPASS (MISCELLANEOUS) ×6 IMPLANT
NS IRRIG 1000ML POUR BTL (IV SOLUTION) ×10 IMPLANT
PACK OPEN HEART (CUSTOM PROCEDURE TRAY) ×2 IMPLANT
PAD ARMBOARD 7.5X6 YLW CONV (MISCELLANEOUS) ×4 IMPLANT
PAD ELECT DEFIB RADIOL ZOLL (MISCELLANEOUS) ×2 IMPLANT
PENCIL BUTTON HOLSTER BLD 10FT (ELECTRODE) ×2 IMPLANT
PUNCH AORTIC ROTATE 4.0MM (MISCELLANEOUS) IMPLANT
PUNCH AORTIC ROTATE 4.5MM 8IN (MISCELLANEOUS) ×2 IMPLANT
PUNCH AORTIC ROTATE 5MM 8IN (MISCELLANEOUS) IMPLANT
SET CARDIOPLEGIA MPS 5001102 (MISCELLANEOUS) ×2 IMPLANT
SPONGE GAUZE 4X4 12PLY (GAUZE/BANDAGES/DRESSINGS) ×4 IMPLANT
SPONGE INTESTINAL PEANUT (DISPOSABLE) ×2 IMPLANT
SPONGE LAP 4X18 X RAY DECT (DISPOSABLE) ×2 IMPLANT
SUT BONE WAX W31G (SUTURE) ×2 IMPLANT
SUT MNCRL AB 4-0 PS2 18 (SUTURE) ×2 IMPLANT
SUT PROLENE 3 0 SH DA (SUTURE) ×2 IMPLANT
SUT PROLENE 4 0 RB 1 (SUTURE) ×1
SUT PROLENE 4 0 SH DA (SUTURE) IMPLANT
SUT PROLENE 4-0 RB1 .5 CRCL 36 (SUTURE) ×1 IMPLANT
SUT PROLENE 6 0 C 1 30 (SUTURE) ×6 IMPLANT
SUT PROLENE 7 0 BV 1 (SUTURE) ×8 IMPLANT
SUT PROLENE 7 0 BV1 MDA (SUTURE) ×2 IMPLANT
SUT PROLENE 8 0 BV175 6 (SUTURE) IMPLANT
SUT SILK  1 MH (SUTURE)
SUT SILK 1 MH (SUTURE) IMPLANT
SUT STEEL 6MS V (SUTURE) ×2 IMPLANT
SUT STEEL STERNAL CCS#1 18IN (SUTURE) IMPLANT
SUT STEEL SZ 6 DBL 3X14 BALL (SUTURE) ×2 IMPLANT
SUT VIC AB 1 CTX 36 (SUTURE) ×2
SUT VIC AB 1 CTX36XBRD ANBCTR (SUTURE) ×2 IMPLANT
SUT VIC AB 2-0 CT1 27 (SUTURE) ×1
SUT VIC AB 2-0 CT1 TAPERPNT 27 (SUTURE) ×1 IMPLANT
SUT VIC AB 2-0 CTX 27 (SUTURE) IMPLANT
SUT VIC AB 3-0 SH 27 (SUTURE)
SUT VIC AB 3-0 SH 27X BRD (SUTURE) IMPLANT
SUT VIC AB 3-0 X1 27 (SUTURE) IMPLANT
SUT VICRYL 4-0 PS2 18IN ABS (SUTURE) IMPLANT
SUTURE E-PAK OPEN HEART (SUTURE) ×2 IMPLANT
SYSTEM SAHARA CHEST DRAIN ATS (WOUND CARE) ×2 IMPLANT
TAPE CLOTH SURG 4X10 WHT LF (GAUZE/BANDAGES/DRESSINGS) ×4 IMPLANT
TOWEL OR 17X24 6PK STRL BLUE (TOWEL DISPOSABLE) ×4 IMPLANT
TOWEL OR 17X26 10 PK STRL BLUE (TOWEL DISPOSABLE) ×4 IMPLANT
TRAY FOLEY IC TEMP SENS 14FR (CATHETERS) IMPLANT
TRAY FOLEY METER SIL LF 16FR (CATHETERS) ×2 IMPLANT
TUBE FEEDING 8FR 16IN STR KANG (MISCELLANEOUS) ×2 IMPLANT
TUBING INSUFFLATION 10FT LAP (TUBING) ×2 IMPLANT
UNDERPAD 30X30 INCONTINENT (UNDERPADS AND DIAPERS) ×2 IMPLANT
WATER STERILE IRR 1000ML POUR (IV SOLUTION) ×4 IMPLANT

## 2012-10-26 NOTE — Anesthesia Preprocedure Evaluation (Addendum)
Anesthesia Evaluation  Patient identified by MRN, date of birth, ID band Patient awake    Reviewed: Allergy & Precautions, H&P , NPO status , Patient's Chart, lab work & pertinent test results, reviewed documented beta blocker date and time   History of Anesthesia Complications Negative for: history of anesthetic complications  Airway Mallampati: II TM Distance: >3 FB Neck ROM: Full    Dental  (+) Teeth Intact and Dental Advisory Given   Pulmonary neg pulmonary ROS, former smoker,          Cardiovascular hypertension, Pt. on home beta blockers and Pt. on medications + CAD     Neuro/Psych negative neurological ROS  negative psych ROS   GI/Hepatic Neg liver ROS, GERD-  Controlled and Medicated,  Endo/Other  diabetes, Type 2, Oral Hypoglycemic Agents  Renal/GU negative Renal ROS     Musculoskeletal   Abdominal   Peds  Hematology negative hematology ROS (+)   Anesthesia Other Findings   Reproductive/Obstetrics                          Anesthesia Physical Anesthesia Plan  ASA: IV  Anesthesia Plan: General   Post-op Pain Management:    Induction: Intravenous  Airway Management Planned: Oral ETT  Additional Equipment: Arterial line, PA Cath and 3D TEE  Intra-op Plan:   Post-operative Plan: Post-operative intubation/ventilation  Informed Consent: I have reviewed the patients History and Physical, chart, labs and discussed the procedure including the risks, benefits and alternatives for the proposed anesthesia with the patient or authorized representative who has indicated his/her understanding and acceptance.   Dental advisory given  Plan Discussed with: CRNA, Anesthesiologist and Surgeon  Anesthesia Plan Comments:       Anesthesia Quick Evaluation

## 2012-10-26 NOTE — Procedures (Signed)
Extubation Procedure Note  Patient Details:   Name: Timothy Murray DOB: [birth year] MRN: [id number]   Airway Documentation:     Evaluation  O2 sats: stable throughout Complications: No apparent complications Patient did tolerate procedure well. Bilateral Breath Sounds: Clear Suctioning: Airway Yes  Pt. Parameters were NIF -36 and VC  .  Pt. Was extubated to a 4 L [place] and was able to respond to questions appropriately.  He was able to perform the IS at 500 ml on 3 consecutives tries.  Pt. Was doing well upon RT's departure.  RT will continue to monitor.   Epifanio Lesches A 10/26/2012, 7:37 PM

## 2012-10-26 NOTE — Progress Notes (Signed)
Weaning pt per SICU protocol.

## 2012-10-26 NOTE — Progress Notes (Signed)
CT Surg PM rounds  Stable post CABG Hemodynamics stable Not bleeding

## 2012-10-26 NOTE — Brief Op Note (Addendum)
10/24/2012 - 10/26/2012  10:58 AM  PATIENT:  Timothy Murray  67 y.o. male  PRE-OPERATIVE DIAGNOSIS:  Coronary Artery Disease  POST-OPERATIVE DIAGNOSIS:  Coronary Artery Disease  PROCEDURE:  Procedure(s) with comments:  CORONARY ARTERY BYPASS GRAFTING x 3 -LIMA to LAD -SVG to OM -SVG to DIAGONAL  ENDOSCOPIC SAPHENOUS VEIN HARVEST RIGHT THIGH  SURGEON:  Surgeon(s) and Role:    * Loreli Slot, MD - Primary  PHYSICIAN ASSISTANT: Erin Barrett PA-C  ANESTHESIA:   general  EBL:  Total I/O In: 700 [I.V.:700] Out: 300 [Urine:300]  BLOOD ADMINISTERED: CELLSAVER  DRAINS: Left Pleural Chest Tube, Mediastinal chest drains   LOCAL MEDICATIONS USED:  NONE  SPECIMEN:  No Specimen  DISPOSITION OF SPECIMEN:  N/A  COUNTS:  YES  PLAN OF CARE: Admit to inpatient   PATIENT DISPOSITION:  ICU - intubated and hemodynamically stable.   Delay start of Pharmacological VTE agent (>24hrs) due to surgical blood loss or risk of bleeding: yes  XC= 70 min CPB= 100 min  Some placque in ascending aorta, good targets and good conduits

## 2012-10-26 NOTE — Anesthesia Postprocedure Evaluation (Signed)
  Anesthesia Post-op Note  Patient: Timothy Murray  Procedure(s) Performed: Procedure(s) with comments: CORONARY ARTERY BYPASS GRAFTING (CABG) (N/A) - Times 3 using left internal mammary artery and endoscopically harvested right saphenous vein  Patient Location: SICU  Anesthesia Type:General  Level of Consciousness: sedated  Airway and Oxygen Therapy: Patient remains intubated per anesthesia plan  Post-op Pain: Pt sedated  Post-op Assessment: Post-op Vital signs reviewed, Patient's Cardiovascular Status Stable, Respiratory Function Stable, Patent Airway and No signs of Nausea or vomiting  Post-op Vital Signs: Reviewed and stable  Complications: No apparent anesthesia complications

## 2012-10-26 NOTE — OR Nursing (Signed)
SICU first call @ 1144 °

## 2012-10-26 NOTE — Interval H&P Note (Signed)
History and Physical Interval Note:  10/26/2012 7:43 AM  Timothy Murray  has presented today for surgery, with the diagnosis of cad  The various methods of treatment have been discussed with the patient and family. After consideration of risks, benefits and other options for treatment, the patient has consented to  Procedure(s): CORONARY ARTERY BYPASS GRAFTING (CABG) (N/A) as a surgical intervention .  The patient's history has been reviewed, patient examined, no change in status, stable for surgery.  I have reviewed the patient's chart and labs.  Questions were answered to the patient's satisfaction.     [name] C

## 2012-10-26 NOTE — Anesthesia Procedure Notes (Signed)
Procedure Name: Intubation Date/Time: 10/26/2012 8:06 AM Performed by: Rogelia Boga Pre-anesthesia Checklist: Patient identified, Emergency Drugs available, Suction available, Patient being monitored and Timeout performed Patient Re-evaluated:Patient Re-evaluated prior to inductionOxygen Delivery Method: Circle system utilized Preoxygenation: Pre-oxygenation with 100% oxygen Intubation Type: IV induction Ventilation: Mask ventilation without difficulty Laryngoscope Size: Mac and 4 Grade View: Grade II Tube type: Oral Tube size: 8.0 mm Number of attempts: 1 Airway Equipment and Method: Stylet Placement Confirmation: ETT inserted through vocal cords under direct vision,  positive ETCO2 and breath sounds checked- equal and bilateral Secured at: 22 (At Teeth) cm Tube secured with: Tape Dental Injury: Teeth and Oropharynx as per pre-operative assessment

## 2012-10-26 NOTE — Transfer of Care (Signed)
Immediate Anesthesia Transfer of Care Note  Patient: Timothy Murray  Procedure(s) Performed: Procedure(s) with comments: CORONARY ARTERY BYPASS GRAFTING (CABG) (N/A) - Times 3 using left internal mammary artery and endoscopically harvested right saphenous vein  Patient Location: SICU  Anesthesia Type:General  Level of Consciousness: sedated  Airway & Oxygen Therapy: Patient remains intubated per anesthesia plan and Patient placed on Ventilator (see vital sign flow sheet for setting)  Post-op Assessment: Post -op Vital signs reviewed and stable and Report given to [facility], RN, SICU  Post vital signs: Reviewed and stable  Complications: No apparent anesthesia complications

## 2012-10-26 NOTE — Progress Notes (Addendum)
Echocardiogram OR Echocardiogram Transesophageal has been performed.  Perley Jain, RDCS 10/26/2012

## 2012-10-26 NOTE — Preoperative (Signed)
Beta Blockers   Reason not to administer Beta Blockers:Not Applicable, Pt took Metoprolol at 0500 today 

## 2012-10-27 ENCOUNTER — Inpatient Hospital Stay (HOSPITAL_COMMUNITY): Payer: Medicare Other

## 2012-10-27 ENCOUNTER — Encounter (HOSPITAL_COMMUNITY): Payer: Self-pay | Admitting: Thoracic Surgery (Cardiothoracic Vascular Surgery)

## 2012-10-27 LAB — GLUCOSE, CAPILLARY
Glucose-Capillary: 118 mg/dL — ABNORMAL HIGH (ref 70–99)
Glucose-Capillary: 119 mg/dL — ABNORMAL HIGH (ref 70–99)
Glucose-Capillary: 122 mg/dL — ABNORMAL HIGH (ref 70–99)
Glucose-Capillary: 129 mg/dL — ABNORMAL HIGH (ref 70–99)
Glucose-Capillary: 141 mg/dL — ABNORMAL HIGH (ref 70–99)
Glucose-Capillary: 148 mg/dL — ABNORMAL HIGH (ref 70–99)
Glucose-Capillary: 171 mg/dL — ABNORMAL HIGH (ref 70–99)

## 2012-10-27 LAB — BASIC METABOLIC PANEL
BUN: 10 mg/dL (ref 6–23)
CO2: 23 mEq/L (ref 19–32)
Calcium: 7.5 mg/dL — ABNORMAL LOW (ref 8.4–10.5)
GFR calc non Af Amer: 89 mL/min — ABNORMAL LOW (ref >90)
Glucose, Bld: 129 mg/dL — ABNORMAL HIGH (ref 70–99)
Sodium: 138 mEq/L (ref 135–145)

## 2012-10-27 LAB — CBC
HCT: 28.2 % — ABNORMAL LOW (ref 39.0–52.0)
Hemoglobin: 9.9 g/dL — ABNORMAL LOW (ref 13.0–17.0)
Hemoglobin: 9.4 g/dL — ABNORMAL LOW (ref 13.0–17.0)
MCH: 32.1 pg (ref 26.0–34.0)
MCH: 31.9 pg (ref 26.0–34.0)
MCHC: 35.1 g/dL (ref 30.0–36.0)
MCHC: 34.3 g/dL (ref 30.0–36.0)
MCV: 91.6 fL (ref 78.0–100.0)
MCV: 92.9 fL (ref 78.0–100.0)
Platelets: 86 10*3/uL — ABNORMAL LOW (ref 150–400)
RBC: 3.08 MIL/uL — ABNORMAL LOW (ref 4.22–5.81)
RDW: 13.2 % (ref 11.5–15.5)

## 2012-10-27 LAB — POCT I-STAT, CHEM 8
BUN: 13 mg/dL (ref 6–23)
Calcium, Ion: 1.17 mmol/L (ref 1.13–1.30)
HCT: 27 % — ABNORMAL LOW (ref 39.0–52.0)
Hemoglobin: 9.2 g/dL — ABNORMAL LOW (ref 13.0–17.0)
Sodium: 137 mEq/L (ref 135–145)
TCO2: 24 mmol/L (ref 0–100)

## 2012-10-27 LAB — CREATININE, SERUM: Creatinine, Ser: 1.08 mg/dL (ref 0.50–1.35)

## 2012-10-27 LAB — MAGNESIUM: Magnesium: 2.3 mg/dL (ref 1.5–2.5)

## 2012-10-27 IMAGING — CR DG CHEST 1V PORT
1 series · 1 of 1 positions shown · non-contrast
Comparison: [DATE] and [DATE].

CLINICAL DATA: Postop CABG.

EXAM:
PORTABLE CHEST - 1 VIEW

[AP]
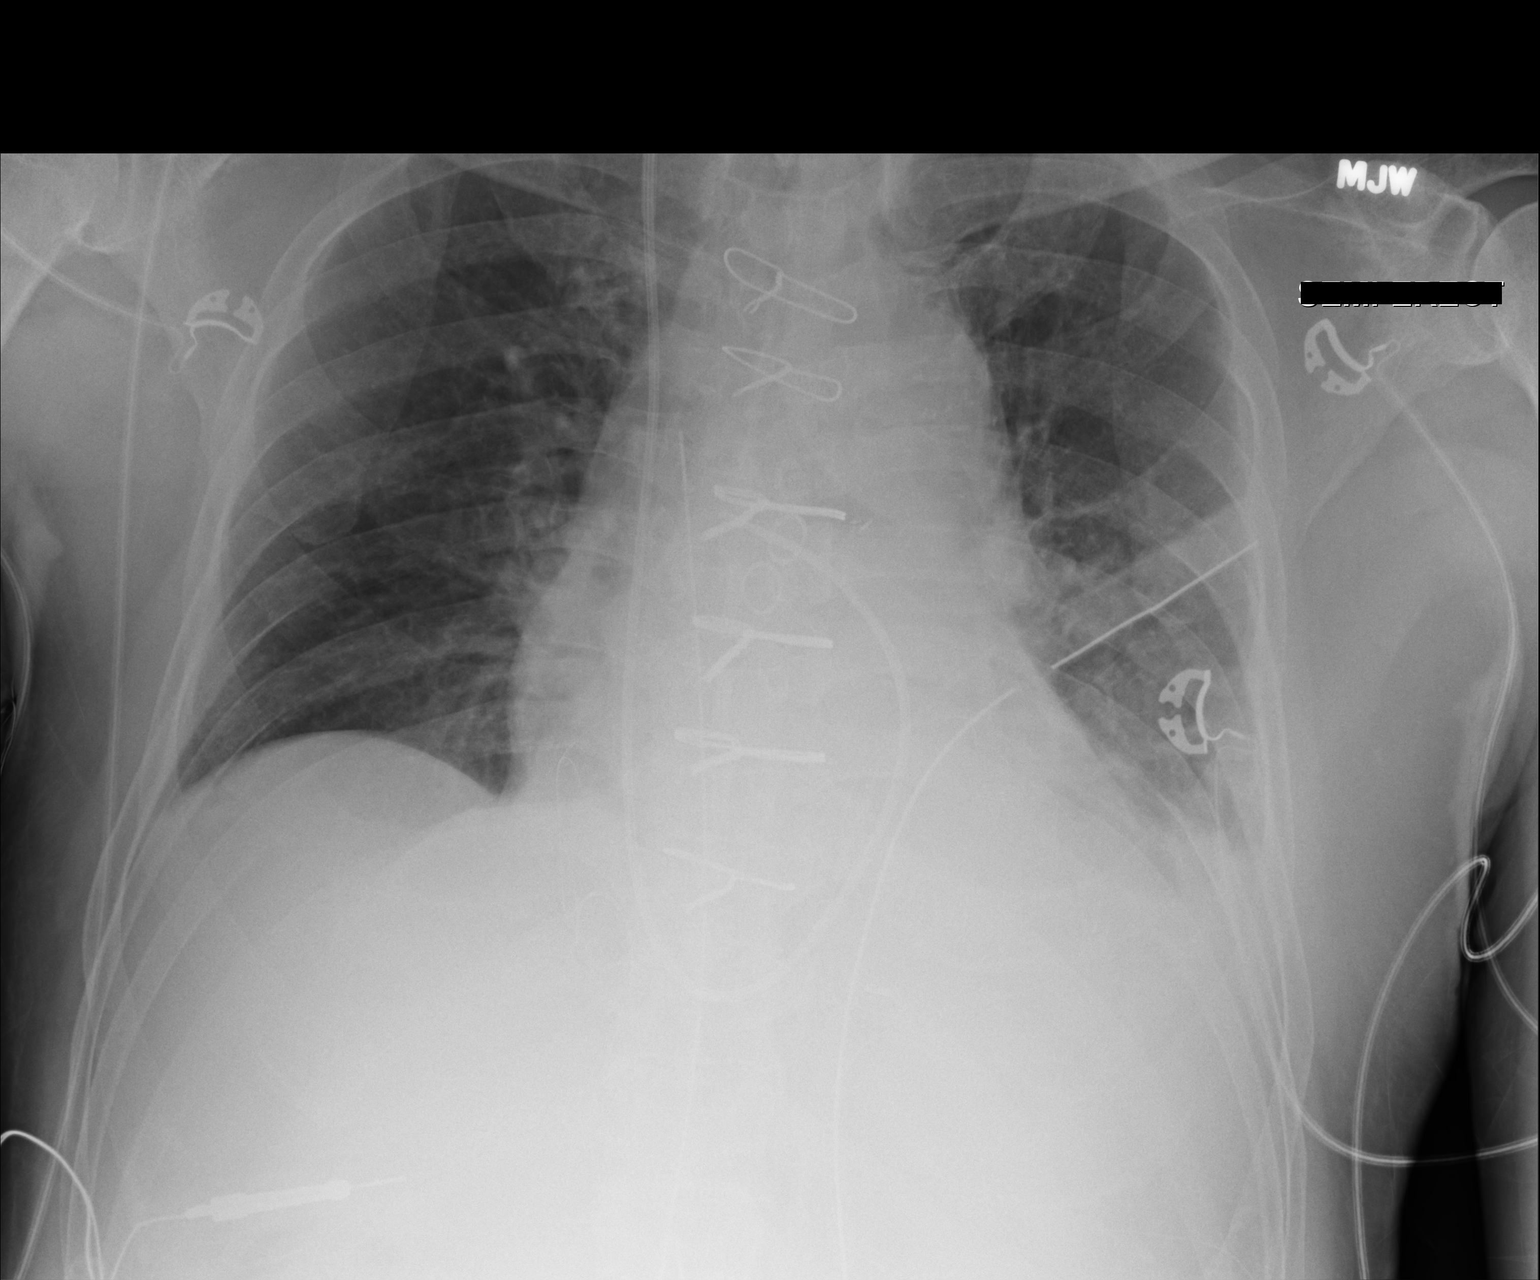

[1 of 1 positions shown; findings below may reference images not displayed]

FINDINGS: [[place]] hr. Interval extubation with removal of the nasogastric tube.
Swan-Ganz catheter, left chest tube and mediastinal drain remain in
place. There are persistent low lung volumes with mildly increased
left basilar pulmonary opacity. There is a small left pleural
effusion. No pneumothorax is evident. The heart size and mediastinal
contours are stable.
IMPRESSION: Mildly increased left basilar atelectasis and left pleural effusion
following extubation. No pneumothorax.

## 2012-10-27 MED ORDER — FUROSEMIDE 10 MG/ML IJ SOLN
40.0000 mg | Freq: Once | INTRAMUSCULAR | Status: AC
  Administered 2012-10-27: 40 mg via INTRAVENOUS
  Filled 2012-10-27: qty 4

## 2012-10-27 MED ORDER — POTASSIUM CHLORIDE 10 MEQ/50ML IV SOLN
INTRAVENOUS | Status: AC
  Filled 2012-10-27: qty 150

## 2012-10-27 MED ORDER — LINAGLIPTIN 5 MG PO TABS
2.5000 mg | ORAL_TABLET | Freq: Every day | ORAL | Status: DC
  Administered 2012-10-27 – 2012-10-30 (×4): 2.5 mg via ORAL
  Filled 2012-10-27 (×4): qty 1

## 2012-10-27 MED ORDER — INSULIN ASPART 100 UNIT/ML ~~LOC~~ SOLN
0.0000 [IU] | SUBCUTANEOUS | Status: DC
  Administered 2012-10-27 – 2012-10-28 (×6): 2 [IU] via SUBCUTANEOUS

## 2012-10-27 MED ORDER — POTASSIUM CHLORIDE 10 MEQ/50ML IV SOLN
10.0000 meq | INTRAVENOUS | Status: DC | PRN
  Administered 2012-10-27: 10 meq via INTRAVENOUS

## 2012-10-27 MED ORDER — INSULIN DETEMIR 100 UNIT/ML ~~LOC~~ SOLN
35.0000 [IU] | Freq: Once | SUBCUTANEOUS | Status: AC
  Administered 2012-10-27: 35 [IU] via SUBCUTANEOUS
  Filled 2012-10-27: qty 0.35

## 2012-10-27 MED ORDER — SODIUM CHLORIDE 0.9 % IV SOLN
1.0000 g | Freq: Once | INTRAVENOUS | Status: AC
  Administered 2012-10-27: 1 g via INTRAVENOUS
  Filled 2012-10-27: qty 10

## 2012-10-27 MED ORDER — ALBUMIN HUMAN 5 % IV SOLN
12.5000 g | Freq: Once | INTRAVENOUS | Status: AC
  Administered 2012-10-27: 12.5 g via INTRAVENOUS
  Filled 2012-10-27: qty 250

## 2012-10-27 MED ORDER — INSULIN DETEMIR 100 UNIT/ML ~~LOC~~ SOLN
35.0000 [IU] | Freq: Every day | SUBCUTANEOUS | Status: DC
  Administered 2012-10-28: 35 [IU] via SUBCUTANEOUS
  Filled 2012-10-27 (×2): qty 0.35

## 2012-10-27 MED ORDER — POTASSIUM CHLORIDE 10 MEQ/50ML IV SOLN
10.0000 meq | INTRAVENOUS | Status: AC
  Administered 2012-10-27 (×3): 10 meq via INTRAVENOUS

## 2012-10-27 NOTE — Progress Notes (Signed)
[facility] BRIEF SICU PROGRESS NOTE  1 Day Post-Op  S/P Procedure(s) (LRB): CORONARY ARTERY BYPASS GRAFTING (CABG) (N/A)   Stable day NSR w/ stable BP off all drips O2 sats 95% on RA Diuresing very well  Plan: Continue routine care  Wilson Dusenbery H 10/27/2012 5:16 PM

## 2012-10-27 NOTE — Progress Notes (Addendum)
[facility] DAILY ICU PROGRESS NOTE                   [address]            [place] [address]          [phone]   1 Day Post-Op Procedure(s) (LRB): CORONARY ARTERY BYPASS GRAFTING (CABG) (N/A)  Total Length of Stay:  LOS: 3 days   Subjective: Awake, alert, sore, but otherwise stable.   Objective: Vital signs in last 24 hours: Temp:  [95.5 F (35.3 C)-100.9 F (38.3 C)] 99.5 F (37.5 C) (10/24 0700) Pulse Rate:  [64-89] 87 (10/24 0700) Cardiac Rhythm:  [-] Atrial paced (10/24 0600) Resp:  [12-30] 18 (10/24 0700) BP: (87-116)/(51-77) 94/56 mmHg (10/24 0700) SpO2:  [100 %] 100 % (10/24 0700) Arterial Line BP: (86-125)/(47-74) 98/48 mmHg (10/24 0700) FiO2 (%):  [40 %-50 %] 40 % (10/23 1900) Weight:  [213 lb 10 oz (96.9 kg)] 213 lb 10 oz (96.9 kg) (10/24 0500)  Filed Weights   10/25/12 0627 10/26/12 0500 10/27/12 0500  Weight: 198 lb 10.2 oz (90.1 kg) 197 lb (89.359 kg) 213 lb 10 oz (96.9 kg)    Weight change: 16 lb 10 oz (7.541 kg)   Hemodynamic parameters for last 24 hours: PAP: (21-45)/(7-29) 25/14 mmHg CO:  [3.2 L/min-4.5 L/min] 4.2 L/min CI:  [1.3 L/min/m2-2.2 L/min/m2] 2 L/min/m2  Intake/Output from previous day: 10/23 0701 - 10/24 0700 In: 6309 [P.O.:210; I.V.:4644; Blood:325; NG/GT:30; IV Piggyback:1100] Out: 5020 [Urine:3900; Emesis/NG output:60; Blood:600; Chest Tube:460]  CBGs:  [phone]    Current Meds: Scheduled Meds: . acetaminophen  1,000 mg Oral Q6H   Or  . acetaminophen (TYLENOL) oral liquid 160 mg/5 mL  1,000 mg Per Tube Q6H  . [facility] HOLD] allopurinol  100 mg Oral Daily  . aspirin EC  325 mg Oral Daily   Or  . aspirin  324 mg Per Tube Daily  . [facility] HOLD] azithromycin  500 mg Oral Daily  . bisacodyl  10 mg Oral Daily   Or  . bisacodyl  10 mg Rectal Daily  . cefUROXime (ZINACEF)  IV  1.5 g Intravenous Q12H  . docusate sodium  200 mg Oral Daily  . famotidine (PEPCID) IV  20 mg Intravenous Q12H  .  insulin regular  0-10 Units Intravenous TID WC  . metoCLOPramide (REGLAN) injection  10 mg Intravenous Q6H  . metoprolol tartrate  12.5 mg Oral BID   Or  . metoprolol tartrate  12.5 mg Per Tube BID  . [MAR HOLD] niacin  500 mg Oral QHS  . [START ON 10/28/2012] pantoprazole  40 mg Oral Daily  . [facility] HOLD] simvastatin  40 mg Oral q1800  . sodium chloride  3 mL Intravenous Q12H   Continuous Infusions: . sodium chloride 20 mL/hr at 10/27/12 0400  . sodium chloride 10 mL/hr (10/26/12 1230)  . sodium chloride    . dexmedetomidine Stopped (10/26/12 1630)  . insulin (NOVOLIN-R) infusion 1.6 Units/hr (10/27/12 0700)  . lactated ringers 20 mL/hr at 10/27/12 0400  . nitroGLYCERIN 0 mcg/min (10/26/12 1245)  . phenylephrine (NEO-SYNEPHRINE) Adult infusion 20 mcg/min (10/27/12 0400)   PRN Meds:.albumin human, metoprolol, midazolam, morphine injection, ondansetron (ZOFRAN) IV, oxyCODONE, sodium chloride   Physical Exam: General appearance: alert, cooperative and no distress Heart: Atrial paced at 88, SR 74 under pacer Lungs: diminished breath sounds bilaterally Extremities: No significant LE edema Wound: Dressed and dry    Lab Results: CBC: Recent Labs  10/26/12 1820 10/27/12 0417  WBC 10.7* 11.6*  HGB 10.8* 9.9*  HCT 30.2* 28.2*  PLT 95* 91*   BMET:  Recent Labs  10/26/12 0340  10/26/12 1815 10/26/12 1820 10/27/12 0417  NA 138  < > 140  --  138  K 4.3  < > 4.5  --  4.3  CL 102  --  106  --  107  CO2 28  --   --   --  23  GLUCOSE 121*  < > 164*  --  129*  BUN 14  --  9  --  10  CREATININE 1.06  --  1.00 0.84 0.85  CALCIUM 8.7  --   --   --  7.5*  < > = values in this interval not displayed.  PT/INR:  Recent Labs  10/26/12 1254  LABPROT 15.5*  INR 1.26   Radiology: Dg Chest Portable 1 View In Am  10/27/2012   CLINICAL DATA:  Postop CABG.  EXAM: PORTABLE CHEST - 1 VIEW  COMPARISON:  10/26/2012 and 10/24/2012.  FINDINGS: 0608 hr. Interval extubation with removal of  the nasogastric tube. Swan-Ganz catheter, left chest tube and mediastinal drain remain in place. There are persistent low lung volumes with mildly increased left basilar pulmonary opacity. There is a small left pleural effusion. No pneumothorax is evident. The heart size and mediastinal contours are stable.  IMPRESSION: Mildly increased left basilar atelectasis and left pleural effusion following extubation. No pneumothorax.   Electronically Signed   By: Roxy Horseman M.D.   On: 10/27/2012 07:31   Dg Chest Portable 1 View  10/26/2012   CLINICAL DATA:  Post up CABG.  EXAM: PORTABLE CHEST - 1 VIEW  COMPARISON:  10/24/2012.  FINDINGS: 1300 hr. Interval median sternotomy and CABG. Tip of the endotracheal tube is in the midtrachea. A nasogastric tube projects below the diaphragm with the tip near the GE junction. Right IJ Swan-Ganz catheter tip is in the proximal right pulmonary artery. Left chest tube and mediastinal drain are in place. There is mild atelectasis at both lung bases. No pneumothorax or significant pleural effusion is seen.  IMPRESSION: No demonstrated complication following CABG.   Electronically Signed   By: Roxy Horseman M.D.   On: 10/26/2012 13:12     Assessment/Plan: S/P Procedure(s) (LRB): CORONARY ARTERY BYPASS GRAFTING (CABG) (N/A)  CV- SBPs soft, still on low dose Neo. Wean and d/c as tolerated.  SR in 70s under pacer.  DM- Wean insulin gtt today and start low dose Levemir. A1C=6.3  Vol overload- start gentle diuresis as BP allows.  Expected postop blood loss anemia- H/H generally stable.  Mobilize, advance diet, wean lines and tubes, routine POD#1 progression.    COLLINS,GINA H 10/27/2012 7:46 AM  Patient seen and examined, agree with above  DC swan, chest tubes  Albumin + calcium then wean neosynephrine  diurese as BP allows  Restart oral hypoglycemic

## 2012-10-27 NOTE — Op Note (Signed)
NAMERYEN, RHAMES NO.:  [id number]  MEDICAL RECORD NO.:  [id number]  LOCATION:  2S02C                        FACILITY:  [facility]  PHYSICIAN:  Salvatore Decent. Dorris Fetch, M.D.DATE OF BIRTH:  05-Nov-1945  DATE OF PROCEDURE:  10/26/2012 DATE OF DISCHARGE:                              OPERATIVE REPORT   PREOPERATIVE DIAGNOSIS:  Severe two-vessel coronary disease with new onset angina.  POSTOPERATIVE DIAGNOSIS:  Severe two-vessel coronary disease with new onset angina.  PROCEDURE:  Median sternotomy, extracorporeal circulation, coronary artery bypass grafting x3 (left internal mammary artery to left anterior descending, saphenous vein graft to first diagonal, saphenous vein graft to obtuse marginal 1), endoscopic vein harvest right thigh.  SURGEON:  Salvatore Decent. Dorris Fetch, M.D.  ASSISTANTS:  Lowella Dandy, PA.  ANESTHESIA:  General.  FINDINGS:  Good quality targets, good quality conduits, some plaque in the ascending aorta at proximal graft sites.  Transesophageal echocardiography revealed preserved left ventricular wall motion, no valvular pathology.  CLINICAL NOTE:  Mr. Sames is a 67 year old gentleman with recent onset chest pain.  He was seen by Dr. Welton Flakes, who did a stress test. It was notable for chest pain and ST depression.  He underwent cardiac catheterization where he was found to have severe 2-vessel coronary artery disease with a totally occluded 1st diagonal with thrombus as well as high-grade lesions of the LAD and circumflex.  He had mild plaquing, but no hemodynamically significant stenosis, in the right coronary.  Left ventricular function was preserved.  He was referred for coronary artery bypass grafting.  The indications, risks, benefits, and alternatives were discussed in detail with the patient.  He understood and accepted the risks and agreed to proceed.  DESCRIPTION OF PROCEDURE:  Mr. Hochmuth was brought to the preoperative holding area on  October 26, 2012, there Anesthesia placed a Swan-Ganz catheter and an arterial blood pressure monitoring line.  Intravenous antibiotics were administered.  He was taken to the operating room, anesthetized, and intubated.  Foley catheter was placed. Transesophageal echocardiography was performed.  It revealed normal left ventricular function with no valvular pathology.  The chest, abdomen, and legs were prepped and draped in usual sterile fashion.    An incision was made in the medial aspect of the right leg at the level of the knee. The greater saphenous vein was harvested from the right thigh endoscopically, it was a good quality vessel.  Simultaneously with this, a median sternotomy was performed and the left internal mammary artery was harvested using standard technique. It likewise was a good quality vessel.  2000 units of heparin was administered and the vessel harvested.  The remainder of the full heparin dose was given prior to opening the pericardium.  The pericardium was opened after confirming adequate anticoagulation with ACT measurement.  The aorta was cannulated via concentric 2-0 Ethibond pledgeted pursestring sutures.  A dual-stage venous cannula was placed via pursestring suture in the right atrial appendage. Cardiopulmonary bypass was instituted and the patient was cooled to 32 degrees Celsius.  The coronary arteries were inspected and anastomotic sites were chosen.  The conduits were inspected and cut to length.  A foam pad was placed in the pericardium to insulate the heart  and protect the left phrenic nerve.  A temperature probe was placed in myocardial septum and a cardioplegic cannula was placed in the ascending aorta.  The aorta was crossclamped.  The left ventricle was emptied via the aortic root vent.  Cardiac arrest then was achieved with combination of cold antegrade blood cardioplegia and topical iced saline.  1.5 L of cardioplegia was administered.  The  myocardial septal temperature fell to 10 degrees Celsius.  There was a rapid diastolic arrest.  A reversed saphenous vein graft was placed end-to-side to the first diagonal branch to the LAD.  This was high anterolateral vessel that had a total occlusion and thrombus proximally, it was a good quality target Distally. It was a 1.5 mm vessel at the site of the anastomosis.  The vein was anastomosed end-to-side with a running 7-0 Prolene suture.  At the completion of each graft, a probe was passed proximally and distally prior to tying the suture.  At the completion of each vein graft, cardioplegia was administered via the graft to assess flow and hemostasis.  Both of which were good with this vessel.  Next, a reversed saphenous vein graft was placed end-to-side to the first obtuse marginal branch of the left circumflex.  This was a 1.5 mm good quality target vessel. The vein was of good quality.  It was anastomosed end-to-side with a running 7-0 Prolene suture.  Again, there was excellent flow through this graft and good hemostasis.  After administering additional cardioplegia via the aortic root as well as the veins, the left internal mammary artery was brought through a window in the pericardium.  The distal end was beveled.  It was then anastomosed end-to-side to the distal LAD. Both the LAD and mammary were 1.5 mm good quality vessels. An end-to-side anastomosis was performed with a running 8-0 Prolene suture.  At the completion of the mammary to LAD anastomosis, the bulldog clamp was briefly removed to inspect for hemostasis.  Immediate and rapid septal rewarming was noted. The bulldog clamp was replaced and the mammary pedicle was tacked to the epicardial surface of the heart with 6-0 Prolene sutures.  Additional cardioplegia was administered down the vein grafts.  They were then cut to length.  The cardioplegia cannula was removed from the ascending aorta.  The proximal vein  graft anastomoses were performed to 4.5 mm punch aortotomies with running 6-0 Prolene sutures.  When the punch aortotomies were made, it was evident that there was significant plaque in the ascending aorta.  This appeared to be a stable plaque, but was debrided to ensure there was an adequate aortotomy to allow blood flow through the grafts.  Care was taken to not allow any debris into the aortic lumen, and the aorta was copiously flushed with heparinized saline prior to performing the anastamoses.  After completion of the final proximal anastomosis, the patient was placed in Trendelenburg position.  Lidocaine was administered.  The bulldog clamp was again removed from the left mammary artery.  The aortic root was de-aired and the aortic crossclamp was removed.  The total crossclamp time was 70 minutes.  The patient spontaneously resumed sinus rhythm and did not require defibrillation.  While rewarming was completed, all proximal and distal anastomoses were inspected for hemostasis.  Epicardial pacing wires were placed on the right ventricle and right atrium.  When the patient had rewarmed to a core temperature of 37 degrees Celsius, he was weaned from cardiopulmonary bypass on the first attempt.  He was in  sinus rhythm and did not require inotropic support.  The total bypass time was 100 minutes.  The initial cardiac index was greater than 2 L/minute/m2.  The patient remained hemodynamically stable throughout the postbypass period.  A test dose protamine was administered and was well tolerated.  The atrial and aortic cannulae were removed.  The remainder of the protamine was administered without incident.  The chest was irrigated with warm saline.  Hemostasis was achieved.  The pericardium was reapproximated with interrupted 3-0 silk sutures and came together easily without tension.  A left pleural and mediastinal chest tubes were placed via separate subcostal incisions.  The sternum  was closed with a combination of single and double heavy gauge stainless steel wires.  The pectoralis fascia, subcutaneous tissue, and skin were closed in standard fashion. All sponge, needle, and instrument counts were correct at the end of the procedure.  The patient was taken from the operating room to the Surgical Intensive Care Unit intubated and in good condition.     Salvatore Decent Dorris Fetch, M.D.     SCH/MEDQ  D:  10/26/2012  T:  10/27/2012  Job:  [id number]

## 2012-10-28 ENCOUNTER — Inpatient Hospital Stay (HOSPITAL_COMMUNITY): Payer: Medicare Other

## 2012-10-28 LAB — CULTURE, GROUP A STREP

## 2012-10-28 LAB — GLUCOSE, CAPILLARY
Glucose-Capillary: 133 mg/dL — ABNORMAL HIGH (ref 70–99)
Glucose-Capillary: 138 mg/dL — ABNORMAL HIGH (ref 70–99)
Glucose-Capillary: 139 mg/dL — ABNORMAL HIGH (ref 70–99)
Glucose-Capillary: 146 mg/dL — ABNORMAL HIGH (ref 70–99)
Glucose-Capillary: 154 mg/dL — ABNORMAL HIGH (ref 70–99)

## 2012-10-28 LAB — BASIC METABOLIC PANEL
CO2: 24 mEq/L (ref 19–32)
Calcium: 8.1 mg/dL — ABNORMAL LOW (ref 8.4–10.5)
GFR calc Af Amer: >90 mL/min (ref >90)
GFR calc non Af Amer: 84 mL/min — ABNORMAL LOW (ref >90)
Glucose, Bld: 154 mg/dL — ABNORMAL HIGH (ref 70–99)
Potassium: 4.2 mEq/L (ref 3.5–5.1)
Sodium: 133 mEq/L — ABNORMAL LOW (ref 135–145)

## 2012-10-28 LAB — CBC
MCH: 32 pg (ref 26.0–34.0)
MCV: 94.1 fL (ref 78.0–100.0)
Platelets: 78 10*3/uL — ABNORMAL LOW (ref 150–400)
RBC: 3.03 MIL/uL — ABNORMAL LOW (ref 4.22–5.81)
WBC: 11.4 10*3/uL — ABNORMAL HIGH (ref 4.0–10.5)

## 2012-10-28 IMAGING — CR DG CHEST 1V PORT
2 series · 2 of 2 positions shown · non-contrast
Comparison: [DATE]

CLINICAL DATA: Postoperative coronary artery bypass grafting

EXAM:
PORTABLE CHEST - 1 VIEW

[AP (1 of 2)]
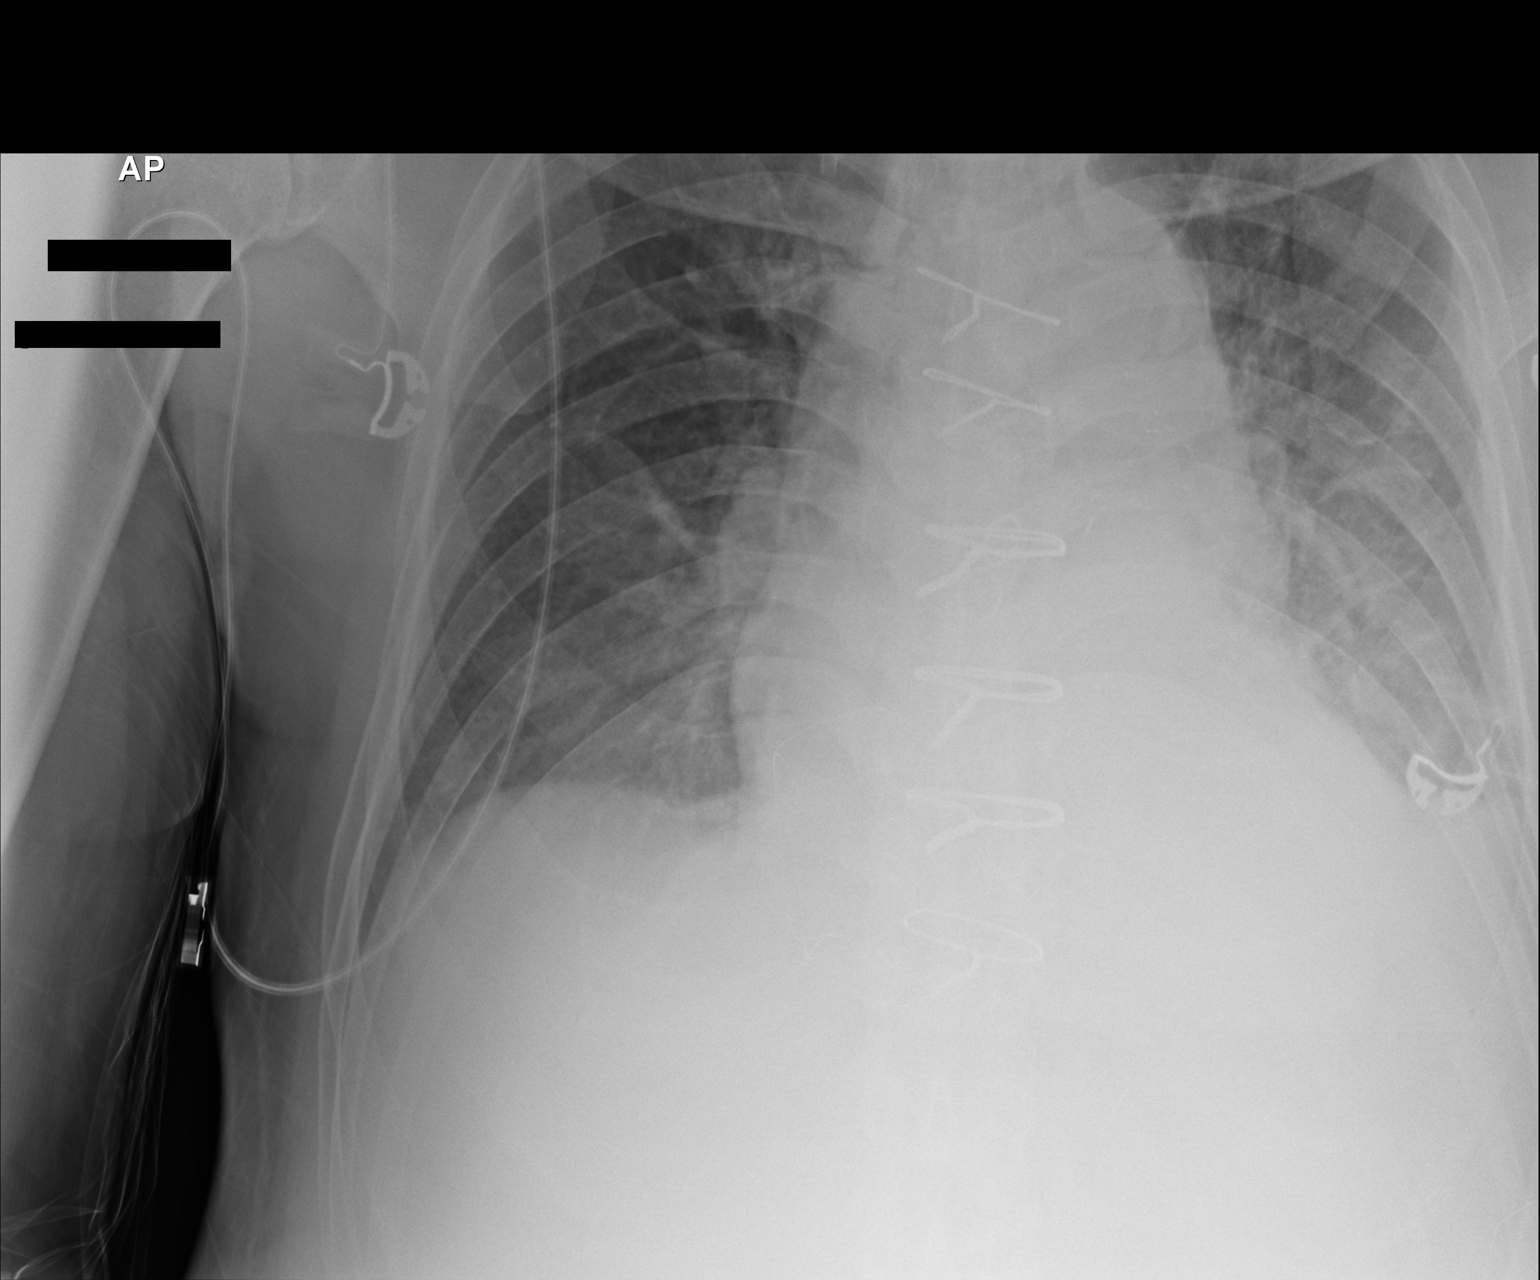

[AP (2 of 2)]
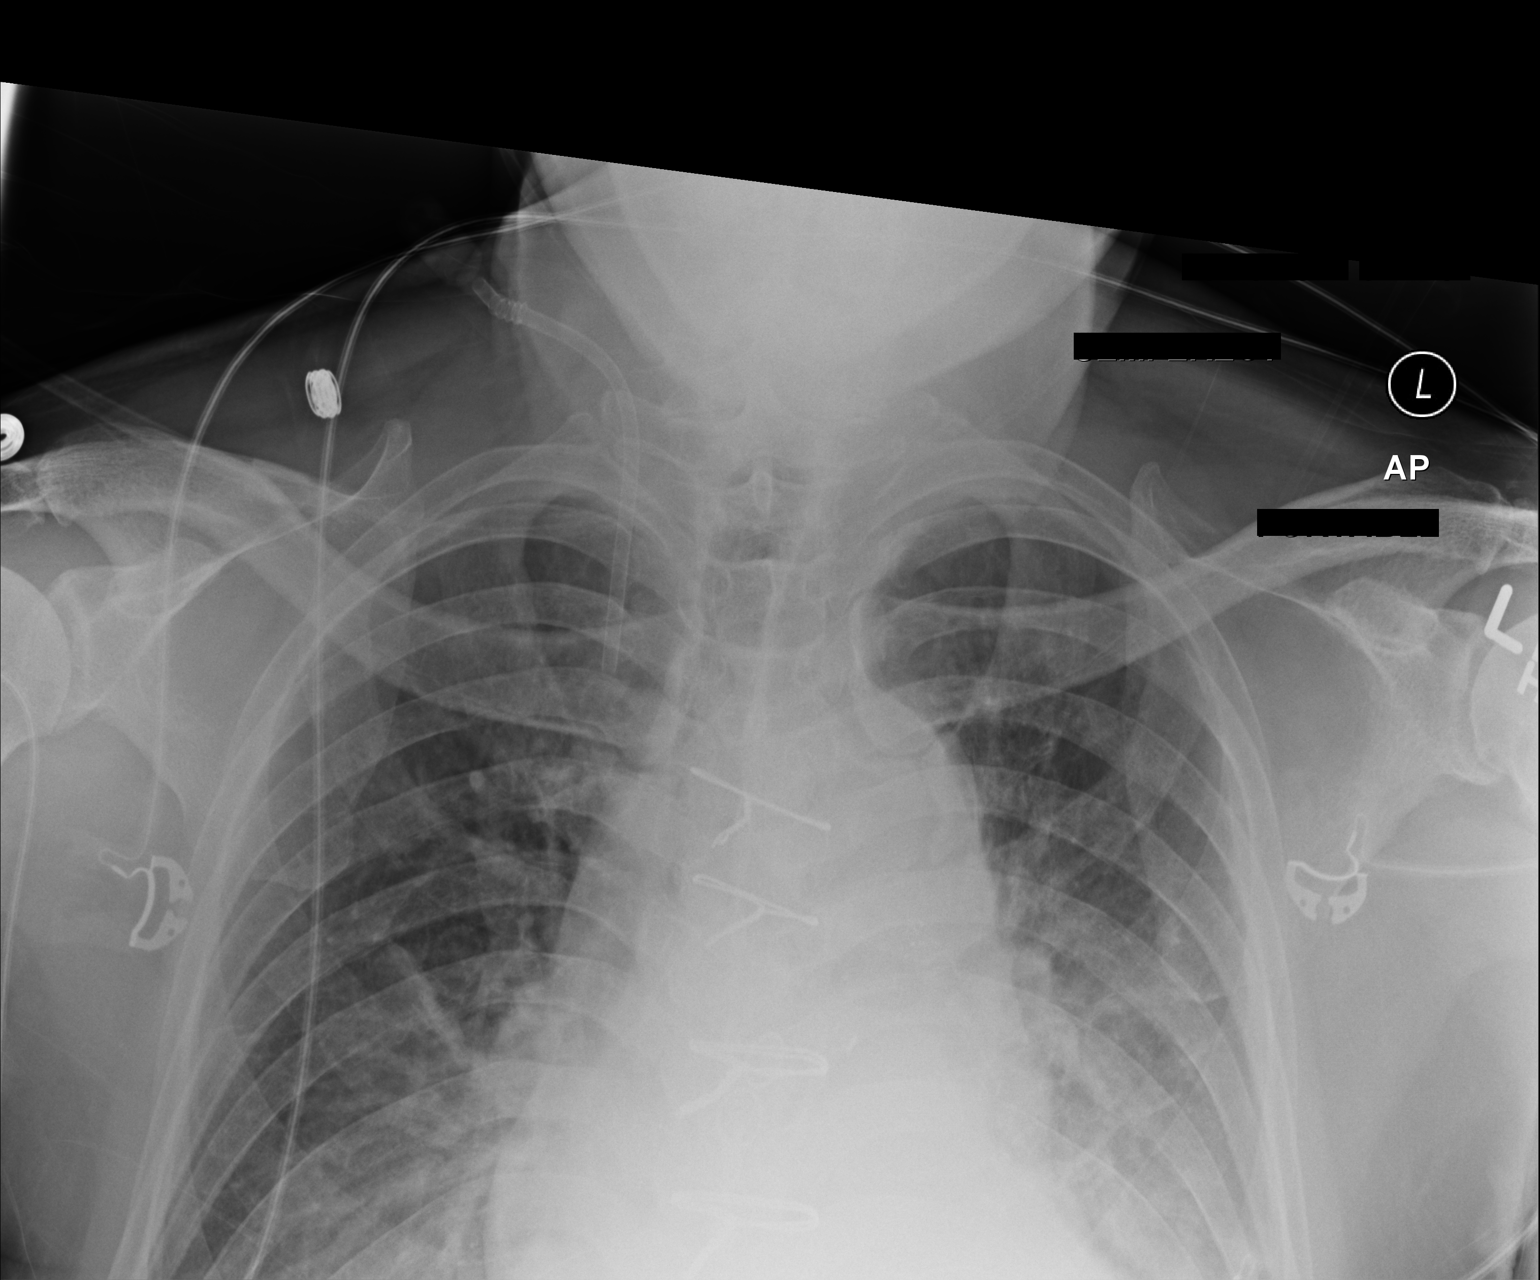

[2 of 2 positions shown; findings below may reference images not displayed]

FINDINGS: Swan-Ganz catheter is been removed. Cordis tip is in the superior
vena cava. Left chest tube and mediastinal drain in been removed. No
pneumothorax.

There is a small left effusion with patchy consolidation in the left
lower lobe. The right lung is clear. Heart is prominent with normal
pulmonary vascularity, stable.
IMPRESSION: No pneumothorax. Patchy opacity left base, primarily due to
atelectasis, with small left effusion. Lungs otherwise clear.

## 2012-10-28 MED ORDER — METOPROLOL TARTRATE 50 MG PO TABS: 50.0000 mg | ORAL_TABLET | Freq: Two times a day (BID) | ORAL | Status: DC

## 2012-10-28 MED ORDER — TRAMADOL HCL 50 MG PO TABS
50.0000 mg | ORAL_TABLET | ORAL | Status: DC | PRN
  Administered 2012-10-29 (×2): 100 mg via ORAL
  Filled 2012-10-28 (×2): qty 2

## 2012-10-28 MED ORDER — SODIUM CHLORIDE 0.9 % IJ SOLN
3.0000 mL | Freq: Two times a day (BID) | INTRAMUSCULAR | Status: DC
  Administered 2012-10-28 – 2012-10-29 (×3): 3 mL via INTRAVENOUS

## 2012-10-28 MED ORDER — DOCUSATE SODIUM 100 MG PO CAPS
200.0000 mg | ORAL_CAPSULE | Freq: Every day | ORAL | Status: DC
  Administered 2012-10-29: 200 mg via ORAL
  Filled 2012-10-28 (×2): qty 2

## 2012-10-28 MED ORDER — FUROSEMIDE 40 MG PO TABS
40.0000 mg | ORAL_TABLET | Freq: Every day | ORAL | Status: DC
  Administered 2012-10-28: 40 mg via ORAL
  Filled 2012-10-28 (×2): qty 1

## 2012-10-28 MED ORDER — METOPROLOL TARTRATE 50 MG PO TABS
50.0000 mg | ORAL_TABLET | Freq: Two times a day (BID) | ORAL | Status: DC
  Administered 2012-10-28 – 2012-10-30 (×5): 50 mg via ORAL
  Filled 2012-10-28 (×6): qty 1

## 2012-10-28 MED ORDER — POTASSIUM CHLORIDE CRYS ER 20 MEQ PO TBCR
20.0000 meq | EXTENDED_RELEASE_TABLET | Freq: Every day | ORAL | Status: DC
  Administered 2012-10-28 – 2012-10-30 (×3): 20 meq via ORAL
  Filled 2012-10-28 (×3): qty 1

## 2012-10-28 MED ORDER — SODIUM CHLORIDE 0.9 % IV SOLN: 250.0000 mL | INTRAVENOUS | Status: DC | PRN

## 2012-10-28 MED ORDER — AMIODARONE LOAD VIA INFUSION
150.0000 mg | Freq: Once | INTRAVENOUS | Status: AC
  Administered 2012-10-28: 150 mg via INTRAVENOUS
  Filled 2012-10-28: qty 83.34

## 2012-10-28 MED ORDER — SODIUM CHLORIDE 0.9 % IJ SOLN: 3.0000 mL | INTRAMUSCULAR | Status: DC | PRN

## 2012-10-28 MED ORDER — AMIODARONE HCL IN DEXTROSE 360-4.14 MG/200ML-% IV SOLN
30.0000 mg/h | INTRAVENOUS | Status: DC
  Administered 2012-10-28 – 2012-10-29 (×2): 30 mg/h via INTRAVENOUS
  Filled 2012-10-28 (×5): qty 200

## 2012-10-28 MED ORDER — BISACODYL 10 MG RE SUPP: 10.0000 mg | Freq: Every day | RECTAL | Status: DC | PRN

## 2012-10-28 MED ORDER — AMIODARONE HCL IN DEXTROSE 360-4.14 MG/200ML-% IV SOLN
60.0000 mg/h | INTRAVENOUS | Status: AC
  Administered 2012-10-28 (×2): 60 mg/h via INTRAVENOUS
  Filled 2012-10-28: qty 200

## 2012-10-28 MED ORDER — MOVING RIGHT ALONG BOOK
Freq: Once | Status: AC
  Administered 2012-10-28: 10:00:00
  Filled 2012-10-28: qty 1

## 2012-10-28 MED ORDER — SIMVASTATIN 40 MG PO TABS: 40.0000 mg | ORAL_TABLET | Freq: Every morning | ORAL | Status: DC

## 2012-10-28 MED ORDER — BISACODYL 5 MG PO TBEC
10.0000 mg | DELAYED_RELEASE_TABLET | Freq: Every day | ORAL | Status: DC | PRN
  Filled 2012-10-28: qty 2

## 2012-10-28 MED ORDER — AMIODARONE HCL IN DEXTROSE 360-4.14 MG/200ML-% IV SOLN
INTRAVENOUS | Status: AC
  Administered 2012-10-28: 60 mg/h via INTRAVENOUS
  Filled 2012-10-28: qty 200

## 2012-10-28 MED ORDER — SIMVASTATIN 40 MG PO TABS
40.0000 mg | ORAL_TABLET | Freq: Every day | ORAL | Status: DC
  Administered 2012-10-28: 40 mg via ORAL
  Filled 2012-10-28 (×2): qty 1

## 2012-10-28 MED ORDER — INSULIN ASPART 100 UNIT/ML ~~LOC~~ SOLN
0.0000 [IU] | Freq: Three times a day (TID) | SUBCUTANEOUS | Status: DC
  Administered 2012-10-28 – 2012-10-30 (×9): 2 [IU] via SUBCUTANEOUS

## 2012-10-28 NOTE — Progress Notes (Addendum)
      [address]       [place] [address]             [phone]        CARDIOTHORACIC SURGERY PROGRESS NOTE   R2 Days Post-Op Procedure(s) (LRB): CORONARY ARTERY BYPASS GRAFTING (CABG) (N/A)  Subjective: Looks good.  Feels sore in chest but o/w okay.  Episode rapid Afib overnight, converted to NSR on amiodarone  Objective: Vital signs: BP Readings from Last 1 Encounters:  10/28/12 111/63   Pulse Readings from Last 1 Encounters:  10/28/12 103   Resp Readings from Last 1 Encounters:  10/28/12 22   Temp Readings from Last 1 Encounters:  10/28/12 97.6 F (36.4 C) Oral    Hemodynamics: PAP: (25-27)/(13-16) 25/13 mmHg  Physical Exam:  Rhythm:   Sinus tach  Breath sounds: clear  Heart sounds:  RRR  Incisions:  Clean and dry  Abdomen:  soft  Extremities:  warm   Intake/Output from previous day: 10/24 0701 - 10/25 0700 In: 1276 [I.V.:666; IV Piggyback:610] Out: 2040 [Urine:1990; Chest Tube:50] Intake/Output this shift: Total I/O In: 116.6 [I.V.:66.6; IV Piggyback:50] Out: 70 [Urine:70]  Lab Results:  CBC: Recent Labs  10/27/12 1630 10/28/12 0400  WBC 10.9* 11.4*  HGB 9.4* 9.7*  HCT 27.4* 28.5*  PLT 86* 78*    BMET:  Recent Labs  10/27/12 0417 10/27/12 1618 10/27/12 1630 10/28/12 0400  NA 138 137  --  133*  K 4.3 3.7  --  4.2  CL 107 99  --  99  CO2 23  --   --  24  GLUCOSE 129* 143*  --  154*  BUN 10 13  --  13  CREATININE 0.85 1.20 1.08 0.98  CALCIUM 7.5*  --   --  8.1*     CBG (last 3)   Recent Labs  10/27/12 2342 10/28/12 0402 10/28/12 0745  GLUCAP 139* 133* 146*    ABG    Component Value Date/Time   PHART 7.375 10/26/2012 2110   PCO2ART 40.5 10/26/2012 2110   PO2ART 106.0* 10/26/2012 2110   HCO3 23.4 10/26/2012 2110   TCO2 24 10/27/2012 1618   ACIDBASEDEF 1.0 10/26/2012 2110   O2SAT 98.0 10/26/2012 2110    CXR: CLINICAL DATA: Postoperative coronary artery bypass grafting  EXAM:  PORTABLE CHEST -  1 VIEW  COMPARISON: October 27, 2012  FINDINGS:  Swan-Ganz catheter is been removed. Cordis tip is in the superior  vena cava. Left chest tube and mediastinal drain in been removed. No  pneumothorax.  There is a small left effusion with patchy consolidation in the left  lower lobe. The right lung is clear. Heart is prominent with normal  pulmonary vascularity, stable.  IMPRESSION:  No pneumothorax. Patchy opacity left base, primarily due to  atelectasis, with small left effusion. Lungs otherwise clear.  Electronically Signed  By: Bretta Bang M.D.  On: 10/28/2012 07:42   Assessment/Plan: S/P Procedure(s) (LRB): CORONARY ARTERY BYPASS GRAFTING (CABG) (N/A)  Overall doing well POD2 Expected post op acute blood loss anemia, mild, stable Expected post op atelectasis, mild Expected post op volume excess, mild Post op Afib, currently maintaining NSR Type II diabetes mellitus, excellent glycemic control   Mobilize  Diuresis  Continue amiodarone for now  Change CBGs and SSI to ac/hs  Transfer step down    Tameyah Koch H 10/28/2012 9:56 AM

## 2012-10-28 NOTE — Progress Notes (Addendum)
CARDIAC REHAB PHASE I   PRE:  Rate/Rhythm: 80 SR  BP:  Supine: 122/70  Sitting:   Standing:    SaO2: 99% 2L  MODE:  Ambulation: 150 ft   POST:  Rate/Rhythm: 82 SR  BP:  Supine:   Sitting: 100/65  Standing:    SaO2: 99% 2L  1510-1545 Pt tolerated ambulation well with assist x2 and pushing a rolling walker. Gait slow, steady, no c/o, VSS.  Cristy Hilts, MS, ACSM CES

## 2012-10-28 NOTE — Progress Notes (Signed)
PA made aware of BP at this time; pt asymptomatic; will cont. To monitor.

## 2012-10-28 NOTE — Progress Notes (Signed)
At 0303 Timothy Murray converted to a-fib RVR with a rate sustaining 180s-190s. BP 94/57. Patient states he can feel heart beating fast in his chest and is diaphoretic. Dr. Cornelius Moras notified. Amiodarone drip started per protocol. Thresa Ross RN

## 2012-10-29 ENCOUNTER — Inpatient Hospital Stay (HOSPITAL_COMMUNITY): Payer: Medicare Other

## 2012-10-29 DIAGNOSIS — E119 Type 2 diabetes mellitus without complications: Secondary | ICD-10-CM

## 2012-10-29 DIAGNOSIS — I2510 Atherosclerotic heart disease of native coronary artery without angina pectoris: Secondary | ICD-10-CM | POA: Insufficient documentation

## 2012-10-29 DIAGNOSIS — I10 Essential (primary) hypertension: Secondary | ICD-10-CM

## 2012-10-29 DIAGNOSIS — E1165 Type 2 diabetes mellitus with hyperglycemia: Secondary | ICD-10-CM

## 2012-10-29 DIAGNOSIS — E785 Hyperlipidemia, unspecified: Secondary | ICD-10-CM

## 2012-10-29 DIAGNOSIS — E1169 Type 2 diabetes mellitus with other specified complication: Secondary | ICD-10-CM

## 2012-10-29 DIAGNOSIS — Z951 Presence of aortocoronary bypass graft: Secondary | ICD-10-CM

## 2012-10-29 LAB — TYPE AND SCREEN
Antibody Screen: NEGATIVE
Unit division: 0
Unit division: 0

## 2012-10-29 LAB — BASIC METABOLIC PANEL
BUN: 19 mg/dL (ref 6–23)
Calcium: 8.6 mg/dL (ref 8.4–10.5)
Creatinine, Ser: 1.01 mg/dL (ref 0.50–1.35)
GFR calc Af Amer: 87 mL/min — ABNORMAL LOW (ref >90)
GFR calc non Af Amer: 75 mL/min — ABNORMAL LOW (ref >90)

## 2012-10-29 LAB — GLUCOSE, CAPILLARY: Glucose-Capillary: 118 mg/dL — ABNORMAL HIGH (ref 70–99)

## 2012-10-29 LAB — CBC
HCT: 29 % — ABNORMAL LOW (ref 39.0–52.0)
MCHC: 35.9 g/dL (ref 30.0–36.0)
MCV: 92.4 fL (ref 78.0–100.0)
RDW: 13.7 % (ref 11.5–15.5)

## 2012-10-29 IMAGING — CR DG CHEST 2V
2 series · 2 of 2 positions shown · non-contrast
Comparison: [DATE]

CLINICAL DATA: Followup atelectasis and effusion

EXAM:
CHEST  2 VIEW

[w chest pa]
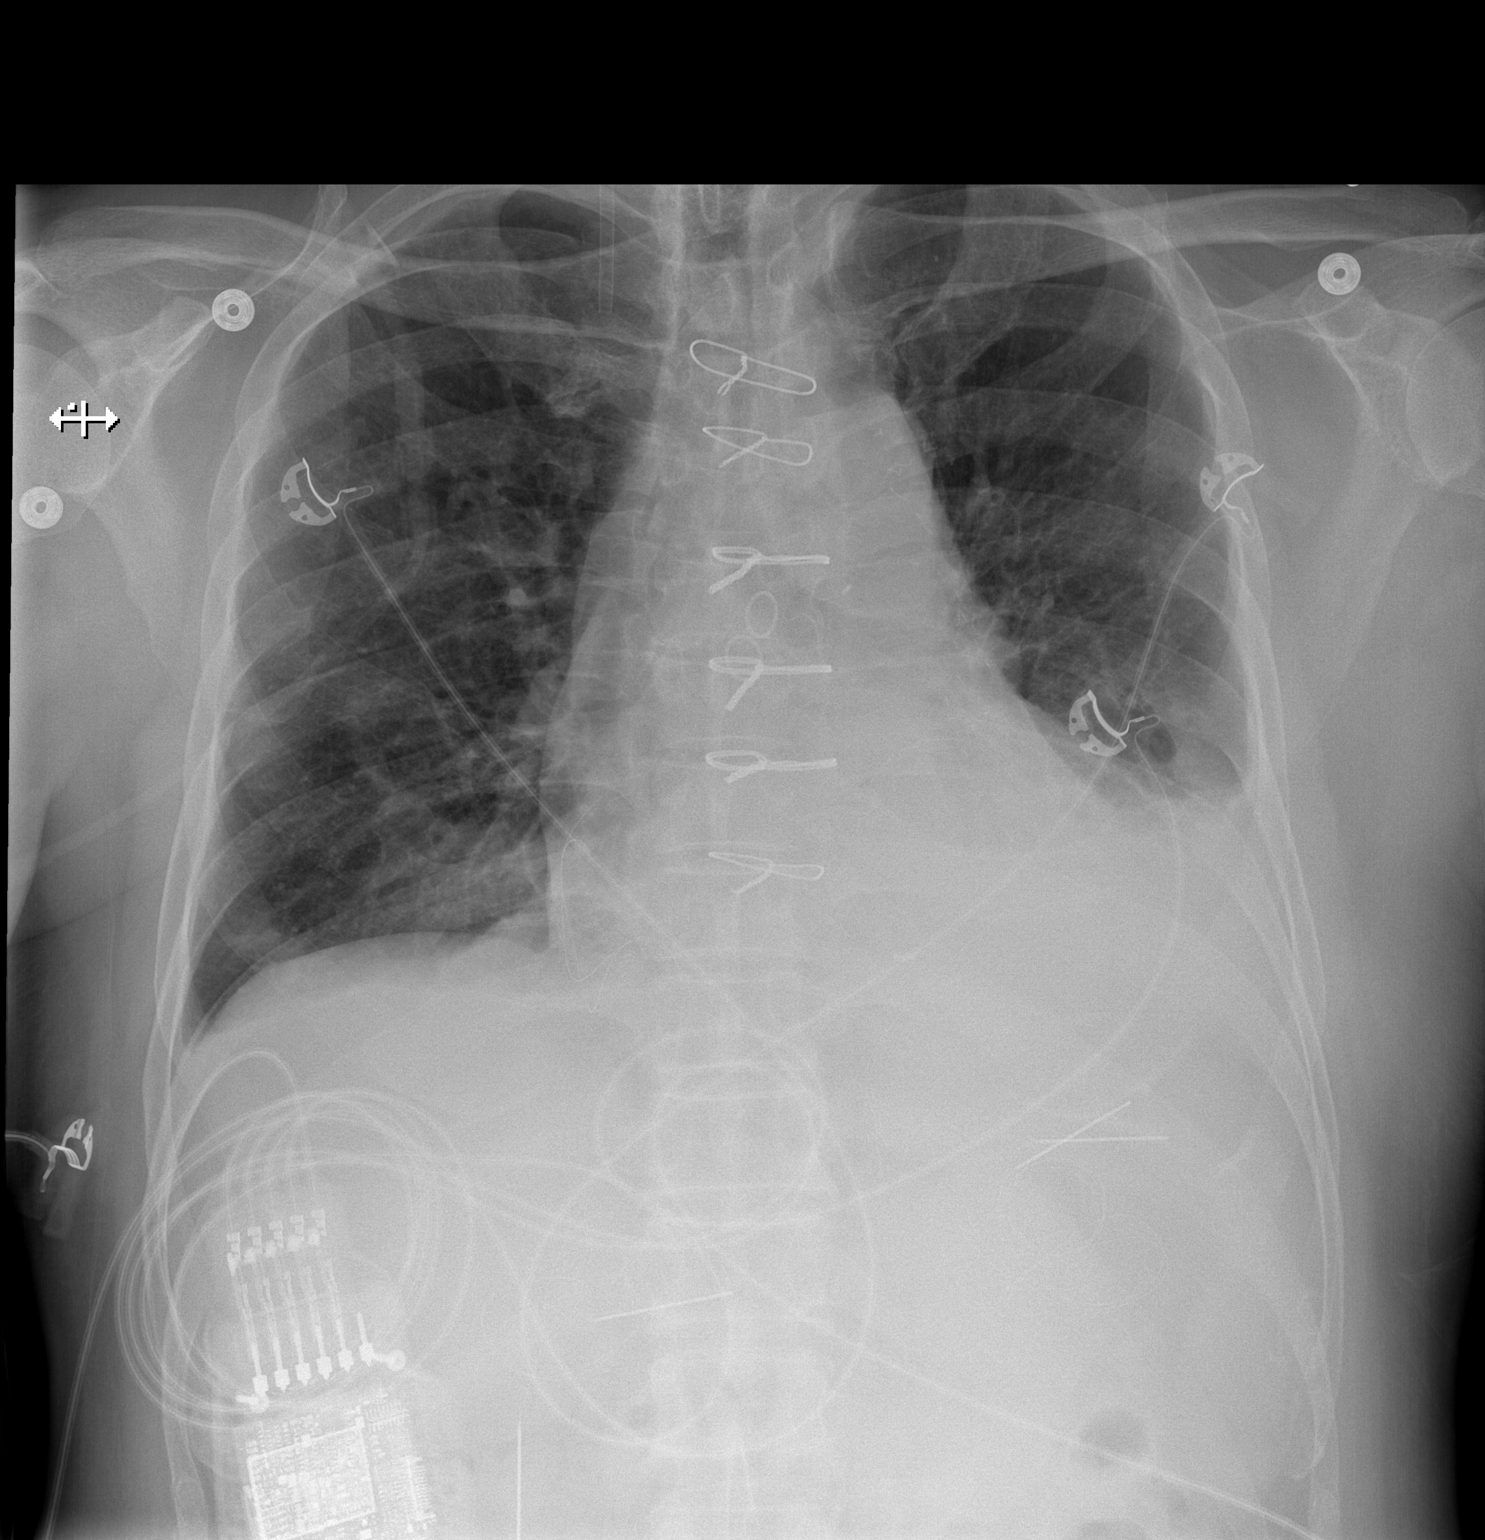

[w chest lat]
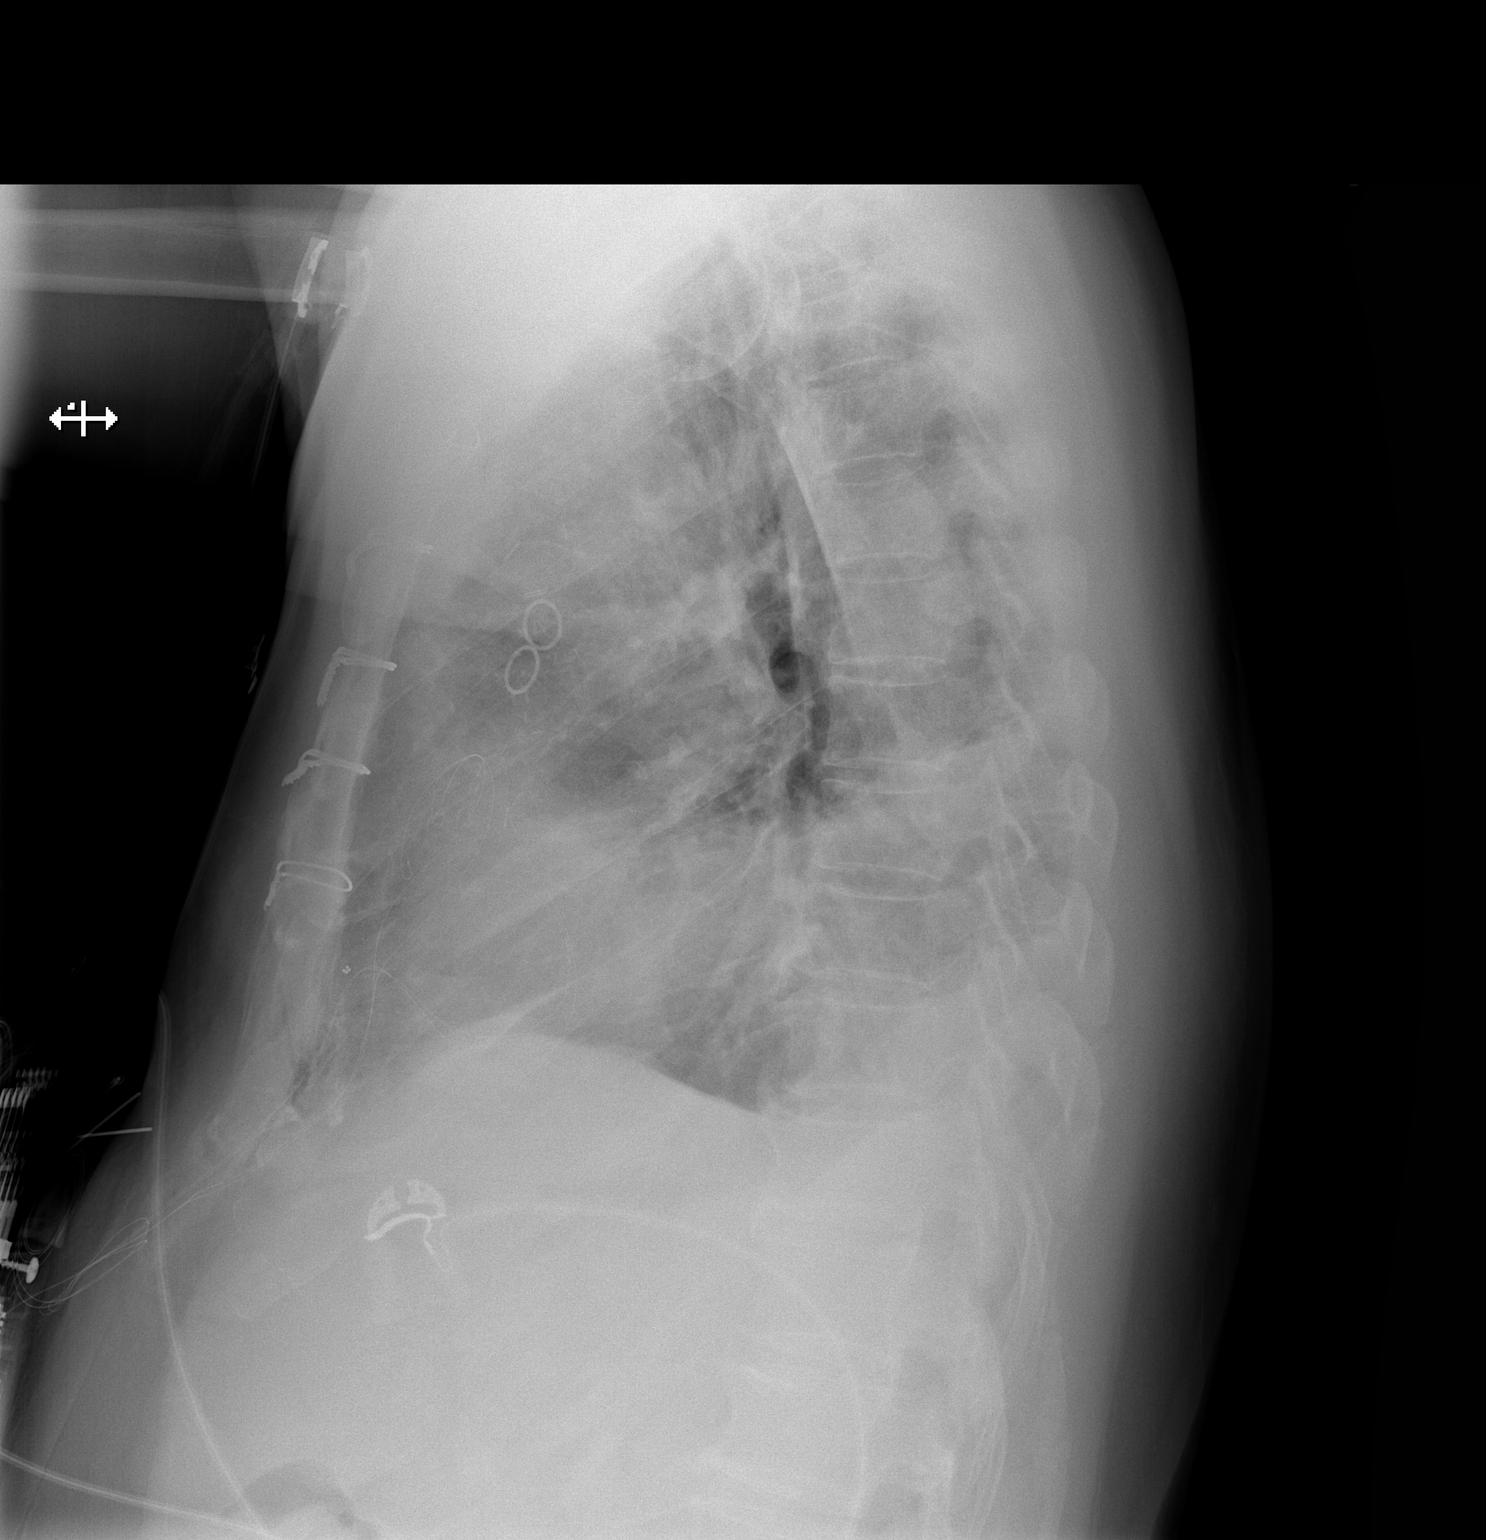

[2 of 2 positions shown; findings below may reference images not displayed]

FINDINGS: No change in position of right internal jugular sheath. Mild right
base opacity suggests stable mild atelectasis. On the left, there is
a moderate effusion with underlying consolidation. No evidence of
pulmonary edema.
IMPRESSION: Mild right lower lobe atelectasis. Moderate left effusion with
underlying consolidation. No significant change.

## 2012-10-29 MED ORDER — FUROSEMIDE 40 MG PO TABS
40.0000 mg | ORAL_TABLET | Freq: Every day | ORAL | Status: DC
  Administered 2012-10-30: 40 mg via ORAL
  Filled 2012-10-29: qty 1

## 2012-10-29 MED ORDER — INSULIN DETEMIR 100 UNIT/ML ~~LOC~~ SOLN
20.0000 [IU] | Freq: Every day | SUBCUTANEOUS | Status: DC
  Administered 2012-10-29 – 2012-10-30 (×2): 20 [IU] via SUBCUTANEOUS
  Filled 2012-10-29 (×2): qty 0.2

## 2012-10-29 MED ORDER — ZOLPIDEM TARTRATE 5 MG PO TABS
5.0000 mg | ORAL_TABLET | Freq: Every evening | ORAL | Status: DC | PRN
  Administered 2012-10-29: 5 mg via ORAL
  Filled 2012-10-29: qty 1

## 2012-10-29 MED ORDER — AMIODARONE HCL 200 MG PO TABS
400.0000 mg | ORAL_TABLET | Freq: Two times a day (BID) | ORAL | Status: DC
  Administered 2012-10-29 – 2012-10-30 (×3): 400 mg via ORAL
  Filled 2012-10-29 (×4): qty 2

## 2012-10-29 MED ORDER — ATORVASTATIN CALCIUM 20 MG PO TABS
20.0000 mg | ORAL_TABLET | Freq: Every day | ORAL | Status: DC
  Administered 2012-10-29 – 2012-10-30 (×2): 20 mg via ORAL
  Filled 2012-10-29 (×2): qty 1

## 2012-10-29 MED ORDER — DIPHENHYDRAMINE HCL 25 MG PO CAPS: 25.0000 mg | ORAL_CAPSULE | Freq: Every evening | ORAL | Status: DC | PRN

## 2012-10-29 MED ORDER — LACTULOSE 10 GM/15ML PO SOLN
20.0000 g | Freq: Every day | ORAL | Status: DC | PRN
  Administered 2012-10-29: 20 g via ORAL
  Filled 2012-10-29: qty 30

## 2012-10-29 MED ORDER — FUROSEMIDE 10 MG/ML IJ SOLN
40.0000 mg | Freq: Once | INTRAMUSCULAR | Status: AC
  Administered 2012-10-29: 40 mg via INTRAVENOUS
  Filled 2012-10-29: qty 4

## 2012-10-29 NOTE — Progress Notes (Signed)
EPW d/c at this time; slight oozing noted at R EPW site; small gauze dressing applied; VSS; will cont. To monitor.

## 2012-10-29 NOTE — Progress Notes (Signed)
Pt ambulated 350 feet with RN; steady gait; no walker needed at this time, but pt states his legs feel weak; pt to chair upon arrival back to room; legs elevated; call bell w/i reach; will cont. To monitor.

## 2012-10-29 NOTE — Progress Notes (Addendum)
      [address]       [place] [address]             [phone]      3 Days Post-Op Procedure(s) (LRB): CORONARY ARTERY BYPASS GRAFTING (CABG) (N/A)  Subjective:  Timothy Murray complains of restless night.  He states he was unable to get to sleep.  He is ambulating with minimal difficulty. No BM  Objective: Vital signs in last 24 hours: Temp:  [98 F (36.7 C)-98.7 F (37.1 C)] 98.2 F (36.8 C) (10/26 0444) Pulse Rate:  [76-103] 87 (10/26 0444) Cardiac Rhythm:  [-] Normal sinus rhythm (10/26 0845) Resp:  [18-20] 18 (10/26 0444) BP: (84-139)/(47-67) 120/60 mmHg (10/26 0444) SpO2:  [96 %-100 %] 99 % (10/26 0444) FiO2 (%):  [24 %] 24 % (10/25 1005) Weight:  [214 lb 4.8 oz (97.206 kg)] 214 lb 4.8 oz (97.206 kg) (10/26 0444)  Intake/Output from previous day: 10/25 0701 - 10/26 0700 In: 926.1 [P.O.:600; I.V.:276.1; IV Piggyback:50] Out: 860 [Urine:860]  General appearance: alert, cooperative and no distress Heart: regular rate and rhythm Lungs: clear to auscultation bilaterally Abdomen: soft, non-tender; bowel sounds normal; no masses,  no organomegaly Extremities: edema trace Wound: clean and dry  Lab Results:  Recent Labs  10/28/12 0400 10/29/12 0540  WBC 11.4* 15.0*  HGB 9.7* 10.4*  HCT 28.5* 29.0*  PLT 78* 126*   BMET:  Recent Labs  10/28/12 0400 10/29/12 0540  NA 133* 134*  K 4.2 4.0  CL 99 97  CO2 24 24  GLUCOSE 154* 140*  BUN 13 19  CREATININE 0.98 1.01  CALCIUM 8.1* 8.6    PT/INR:  Recent Labs  10/26/12 1254  LABPROT 15.5*  INR 1.26   ABG    Component Value Date/Time   PHART 7.375 10/26/2012 2110   HCO3 23.4 10/26/2012 2110   TCO2 24 10/27/2012 1618   ACIDBASEDEF 1.0 10/26/2012 2110   O2SAT 98.0 10/26/2012 2110   CBG (last 3)   Recent Labs  10/28/12 1624 10/28/12 2121 10/29/12 0646  GLUCAP 154* 141* 121*    Assessment/Plan: S/P Procedure(s) (LRB): CORONARY ARTERY BYPASS GRAFTING (CABG) (N/A)  1. CV-  Previous A. Fib, currently NSR- will switch Amiodarone to PO, continue Lopressor 2. Pulm- off oxygen, no acute issues, encouraged continued use of IS 3. Renal- creatinine WNL, volume overloaded, weight is elevated 14 lbs, will continue diuresis 4. DM- CBGs controlled, on home medication, will decrease insulin dose 5. Dispo- patient doing well, maintaining NSR, d/c EPW, likely d/c in next 24-48 hours   LOS: 5 days    BARRETT, ERIN 10/29/2012  I have seen and examined the patient and agree with the assessment and plan as outlined.  OWEN,CLARENCE H 10/29/2012 10:54 AM

## 2012-10-29 NOTE — Progress Notes (Signed)
R IJ central line and transducer d/c at this time per MD order; occlusive dressing applied; will cont. To monitor.

## 2012-10-29 NOTE — Progress Notes (Signed)
Pt ambulated 350 feet pushing wheelchair; pt to chair after walk; VSS; wife in room; steady gait noted; will cont. To monitor. Timothy Murray

## 2012-10-29 NOTE — Discharge Summary (Signed)
Physician Discharge Summary  Patient ID: Timothy Murray MRN: [id number] DOB/AGE: Jan 18, 1945 67 y.o.  Admit date: 10/24/2012 Discharge date: 10/30/2012  Admission Diagnoses:  Patient Active Problem List   Diagnosis Date Noted  . HTN (hypertension) 10/29/2012  . Diabetes 10/29/2012  . Other and unspecified hyperlipidemia 10/29/2012  . CAD (coronary artery disease), native coronary artery 10/29/2012  . S/P CABG x 3 10/29/2012   Discharge Diagnoses:   Patient Active Problem List   Diagnosis Date Noted  . HTN (hypertension) 10/29/2012  . Diabetes 10/29/2012  . Other and unspecified hyperlipidemia 10/29/2012  . CAD (coronary artery disease), native coronary artery 10/29/2012  . S/P CABG x 3 10/29/2012   Discharged Condition: good  History of Present Illness:   Timothy Murray is a 67 y.o. male who is admitted in transfer from [facility] with severe coronary artery disease. He relocated to [place] last year from [place], [place] after retiring. He has no history of CAD, but does have a history of hypertension, hyperlipidemia, tobacco use, and diabetes. Earlier in the year, he had an episode of chest pain which was apparently worked up with a nuclear stress test. The patient was told the results were negative, and that his chest pain was due to GERD. He has had no further chest pain until recently. He and his wife recently traveled to [place] on a 21 day tour, and he reports having one episode of left sided chest pain, non-radiating, which occurred while he was lifting his luggage. There was no associated nausea, diaphoresis, or dyspnea. The pain resolved with rest and did not recur during the trip. However, last Friday, the patient was pushing a cart through the grocery store when he noted pain similar in nature to the previous episode. He sought care from his primary care MD and was referred to Dr. Welton Flakes for stress testing, which was performed on 10/23/2012, and was notable for  chest pain, 3/10. He then underwent cardiac catheterization on 10/24/2012, which showed severe coronary artery disease, including an occluded D1 with thrombus, high grade lesions of the mid and distal LAD, high grade lesions in the mid LCx and OM1, and mild to moderate disease in the RCA. LVEF is 60%. He was subsequently transferred to [facility] for consideration of CABG.  Hospital Course:   Upon arrival to the hospital the patient remained chest pain free.  He was evaluated by Dr. Dorris Fetch who agreed the patient would benefit from Coronary Bypass Grafting procedure.  The risks and benefits of the procedure were explained to the patient and he was agreeable to proceed.  Preoperative testing was completed and the patient was taken to the operating room on 10/26/2012.  He underwent CABG x 3 utilizing LIMA to LAD, SVG to OM1, and SVG to Diagonal.  He also underwent Endoscopic Saphenous vein harvest from his right thigh.  The patient tolerated the procedure well and was taken to the SICU in stable condition.  The patient was extubated the evening of surgery.  During his stay in the SICU the patient was weaned off Neo drip as tolerated.  The patient's chest tubes and arterial lines were removed without difficulty.  The patient developed Atrial Fibrillation with rapid ventricular response.  He was treated with IV Amiodarone with successful conversion to NSR.  He was medically stable and transferred to the step down unit in stable condition.  The patient has continued to progress.  He continues to maintain NSR and his pacing wires have been removed without  difficulty.  He is ambulating without difficulty.  He is tolerating a diabetic diet.   He underwent a left thoracentesis on 10/27. Approximately 850 ml of dark bloody fluid was removed.He is felt surgically stable for discharge today.  He will need to follow up with Dr. Dorris Fetch in 3 weeks with a CXR prior to his appointment.  He will also need to follow up  with Cardiology in 2-4 weeks.     Treatments: surgery:   Median sternotomy, extracorporeal circulation, coronary artery bypass grafting x3 (left internal mammary artery to left anterior descending, saphenous vein graft to first diagonal, saphenous vein graft to obtuse marginal 1), endoscopic vein harvest right thigh.  Disposition: Home  Discharge Medications:    Medication List    STOP taking these medications       simvastatin 40 MG tablet  Commonly known as:  ZOCOR      TAKE these medications       amiodarone 400 MG tablet  Commonly known as:  PACERONE  Take 1 tablet (400 mg total) by mouth 2 (two) times daily. For 4 days; then take Amiodarone 200 mg by mouth two times daily thereafter     aspirin 325 MG EC tablet  Take 1 tablet (325 mg total) by mouth daily.     atorvastatin 20 MG tablet  Commonly known as:  LIPITOR  Take 1 tablet (20 mg total) by mouth daily at 6 PM.     furosemide 40 MG tablet  Commonly known as:  LASIX  Take 1 tablet (40 mg total) by mouth daily. For one week then stop.     glucosamine-chondroitin 500-400 MG tablet  Take 1 tablet by mouth daily.     metoprolol succinate 100 MG 24 hr tablet  Commonly known as:  TOPROL-XL  Take 100 mg by mouth daily.     niacin 500 MG CR capsule  Take 1 capsule (500 mg total) by mouth at bedtime.     omeprazole 20 MG capsule  Commonly known as:  PRILOSEC  Take 20 mg by mouth every morning.     oxyCODONE 5 MG immediate release tablet  Commonly known as:  Oxy IR/ROXICODONE  Take 1-2 tablets (5-10 mg total) by mouth every 3 (three) hours as needed.     potassium chloride SA 20 MEQ tablet  Commonly known as:  K-DUR,KLOR-CON  Take 1 tablet (20 mEq total) by mouth daily. For one week then stop.     predniSONE 10 MG tablet  Commonly known as:  STERAPRED UNI-PAK  Take Prednisone 10 mg by mouth at bedtime 10/30/2012;then take Prednisone 10 mg by mouth (with food) three times  on 10/31/2012;then take Prednisone 10  mg by mouth (with food) two times on 11/01/2012;then take Prednisone 10 mg by mouth (with food) once on 11/02/2012 then stop     saxagliptin HCl 2.5 MG Tabs tablet  Commonly known as:  ONGLYZA  Take 2.5 mg by mouth daily.        The patient has been discharged on:   1.Beta Blocker:  Yes [x   ]                              No   [   ]                              If No, reason:  2.Ace  Inhibitor/ARB: Yes [   ]                                     No  [  x  ]                                     If No, reason: Labile blood pressure  3.Statin:   Yes [ x  ]                  No  [   ]                  If No, reason:  4.Ecasa:  Yes  [ x  ]                  No   [   ]                  If No, reason:     Discharge Orders   Future Appointments Provider Department Dept Phone   11/21/2012 12:00 PM Loreli Slot, MD [facility] [phone]   Future Orders Complete By Expires   Amb Referral to Cardiac Rehabilitation  As directed    Comments:     [place]     Follow-up Information   Follow up with Loreli Slot, MD On 11/21/2012. (Appointment time is 12:00 pm)    Specialty:  Cardiothoracic Surgery   Contact information:   [address] [place] [address] [phone]       Follow up with [facility] On 11/21/2012. (Appointment time is 11:00 am)    Contact information:   [place]       Signed: ZIMMERMAN,DONIELLE M 10/30/2012, 3:53 PM

## 2012-10-29 NOTE — Progress Notes (Signed)
Pt sitting up in chair at this time; pt not wanting to ambulate; pt voiced he did not sleep at all last night; will encourage ambulation later this afternoon; wife in room; pt nor wife slept last night; will cont. To monitor.

## 2012-10-30 ENCOUNTER — Inpatient Hospital Stay (HOSPITAL_COMMUNITY): Payer: Medicare Other

## 2012-10-30 LAB — GLUCOSE, CAPILLARY
Glucose-Capillary: 150 mg/dL — ABNORMAL HIGH (ref 70–99)
Glucose-Capillary: 121 mg/dL — ABNORMAL HIGH (ref 70–99)
Glucose-Capillary: 150 mg/dL — ABNORMAL HIGH (ref 70–99)

## 2012-10-30 IMAGING — CR DG CHEST 1V
1 series · 1 of 1 positions shown · non-contrast
Comparison: [DATE]

CLINICAL DATA: Status post left thoracentesis

EXAM:
CHEST - 1 VIEW

[w chest pa]
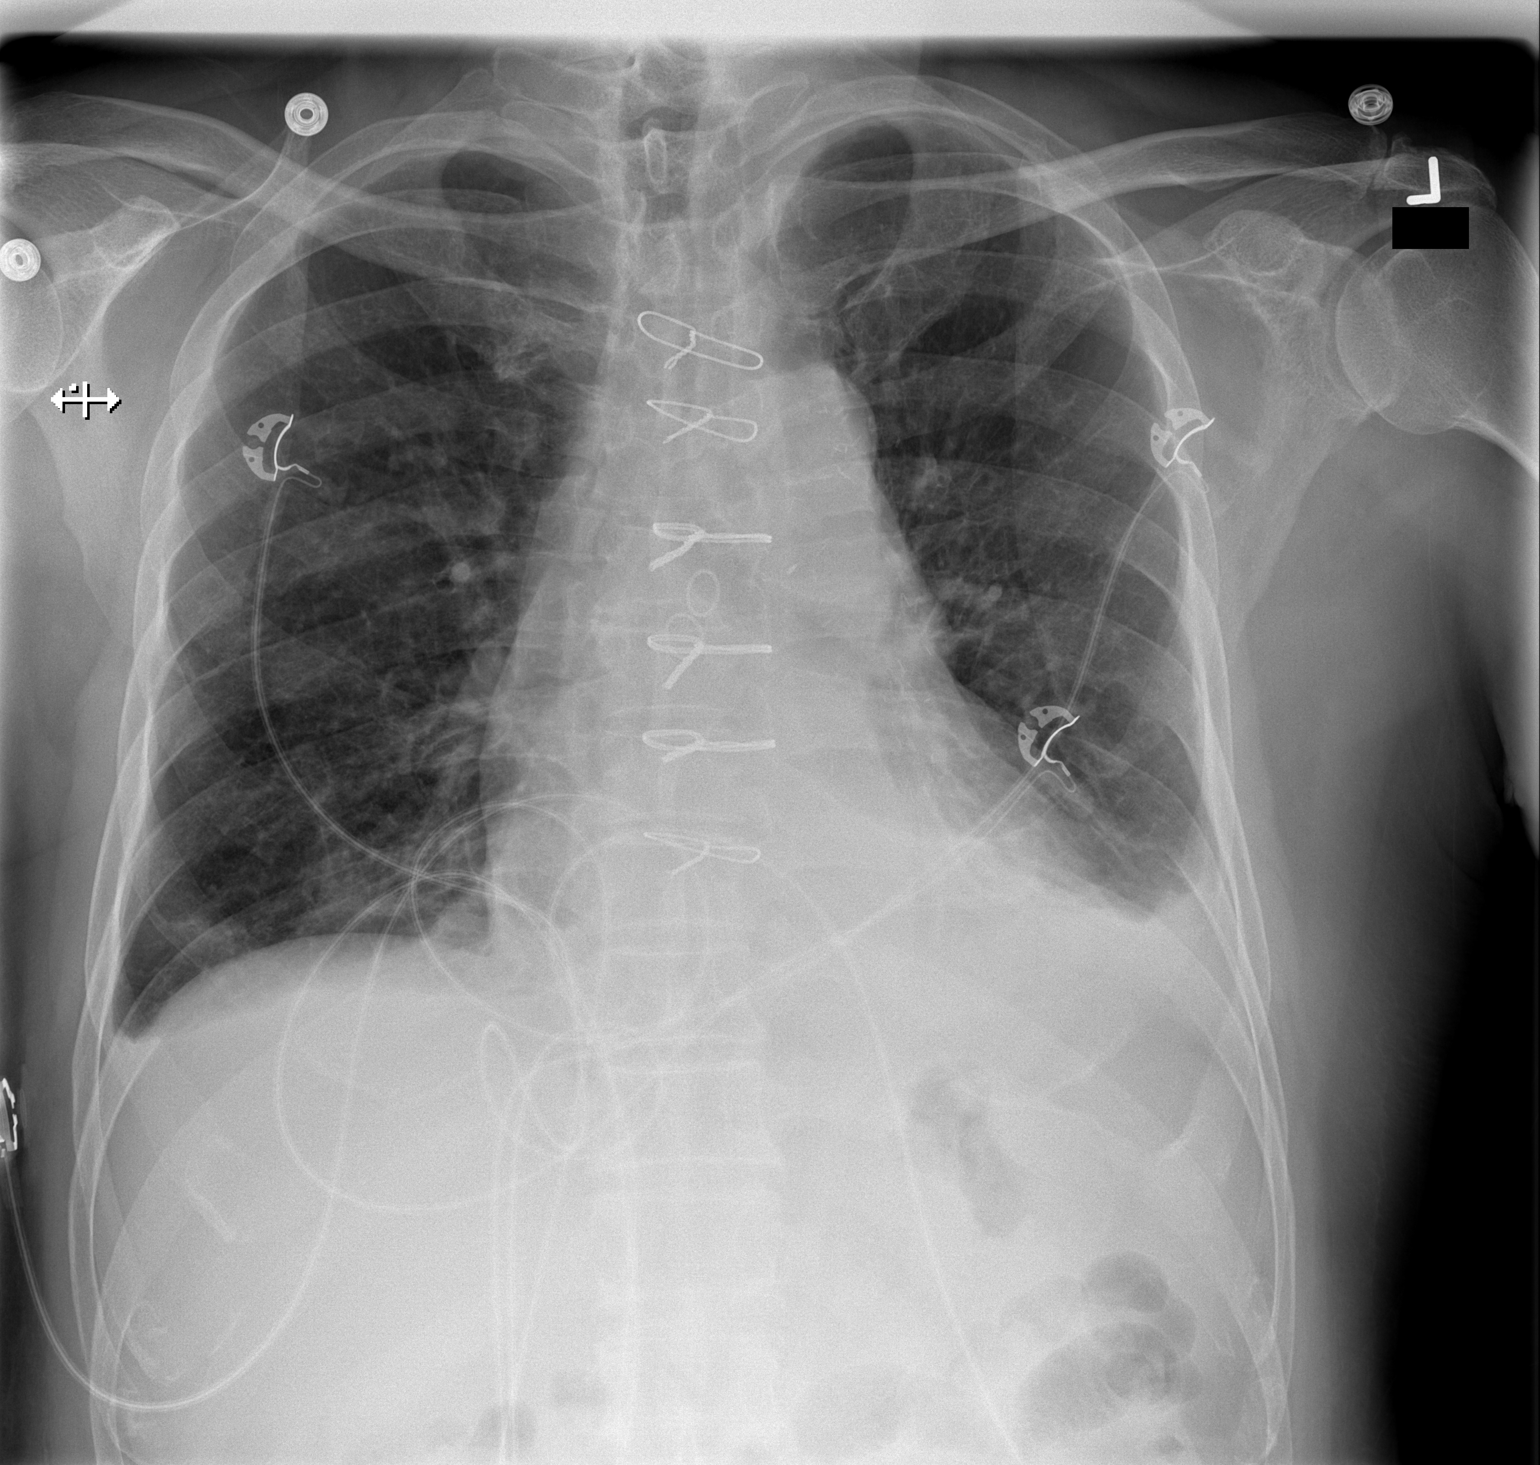

[1 of 1 positions shown; findings below may reference images not displayed]

FINDINGS: The cardiac shadow is stable. There has been reduction in a
left-sided pleural effusion although a small amount of pleural fluid
remains. No pneumothorax is noted. A tiny right-sided pleural
effusion is noted.
IMPRESSION: Improvement in the degree of left-sided pleural effusion following
thoracentesis. No pneumothorax is noted.

## 2012-10-30 IMAGING — US US THORACENTESIS ASP PLEURAL SPACE W/IMG GUIDE
1 series · 8 of 8 positions shown · non-contrast
Comparison: None.

CLINICAL DATA: Recent CABG, left pleural effusion.

EXAM:
ULTRASOUND GUIDED left THORACENTESIS

[Series 1: us thoracentesis asp pleural space w/img guide · 0.24mm/px · 8 of 8 slices shown]
[im 1/8]
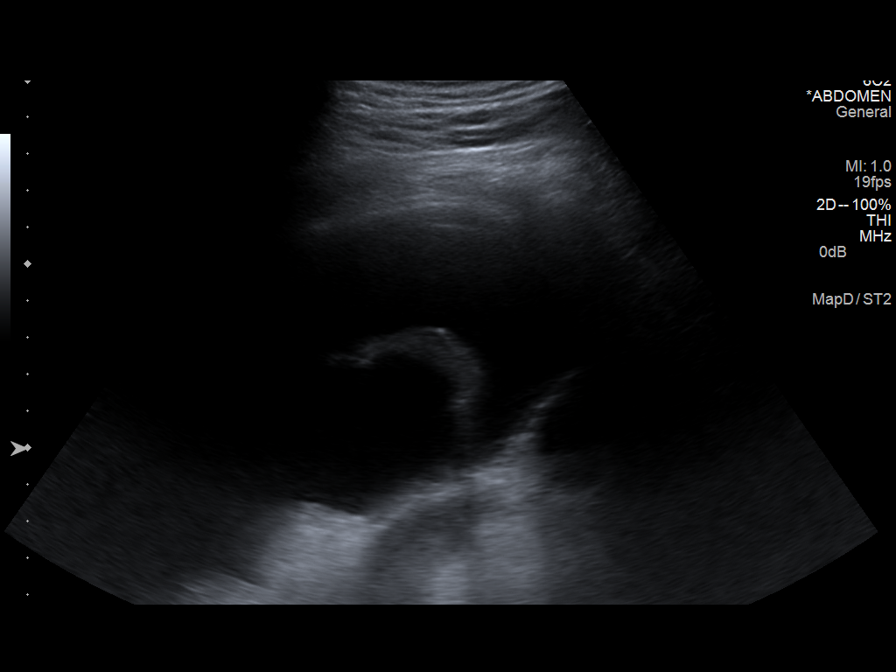
[im 2/8]
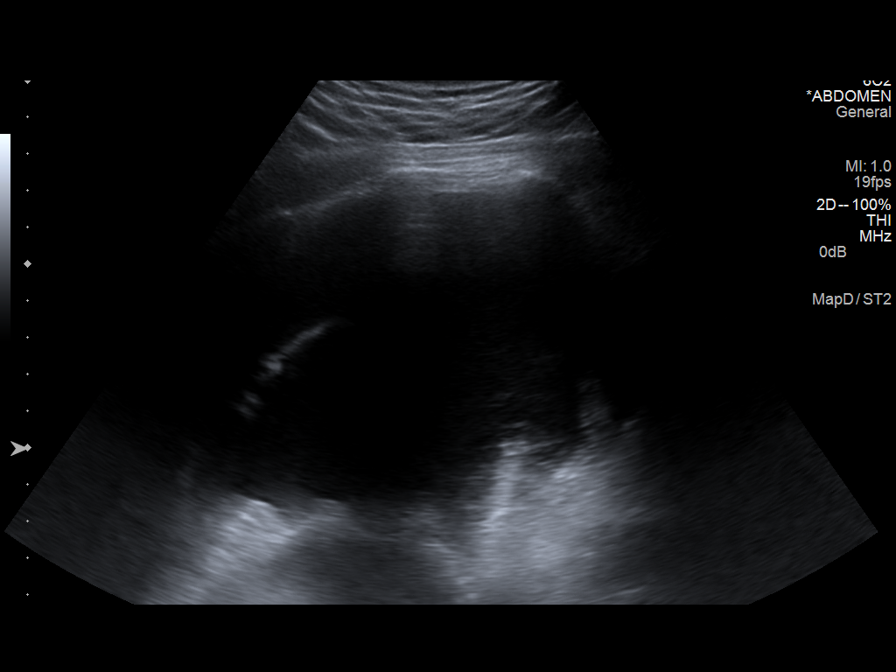
[im 3/8]
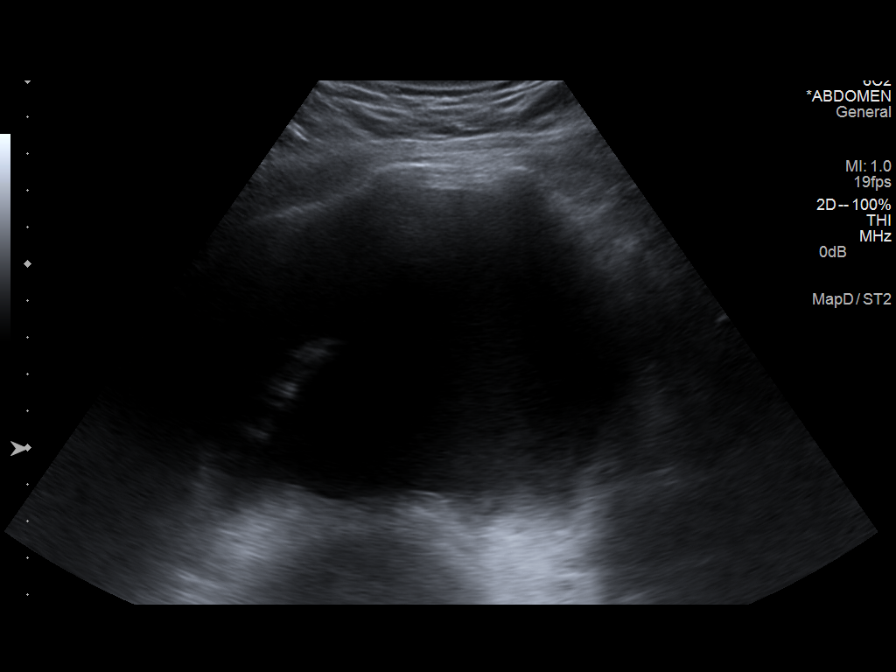
[im 4/8]
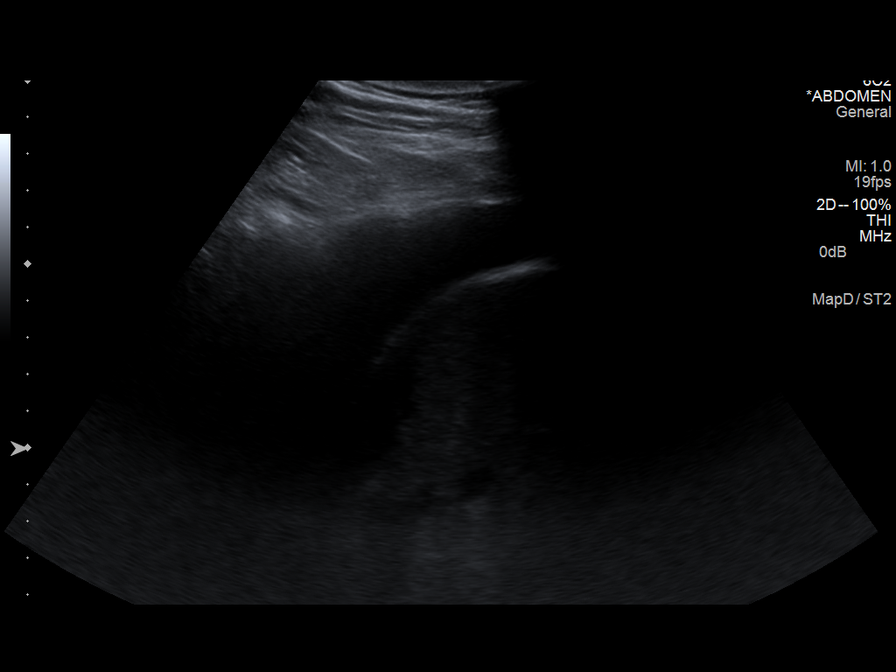
[im 5/8]
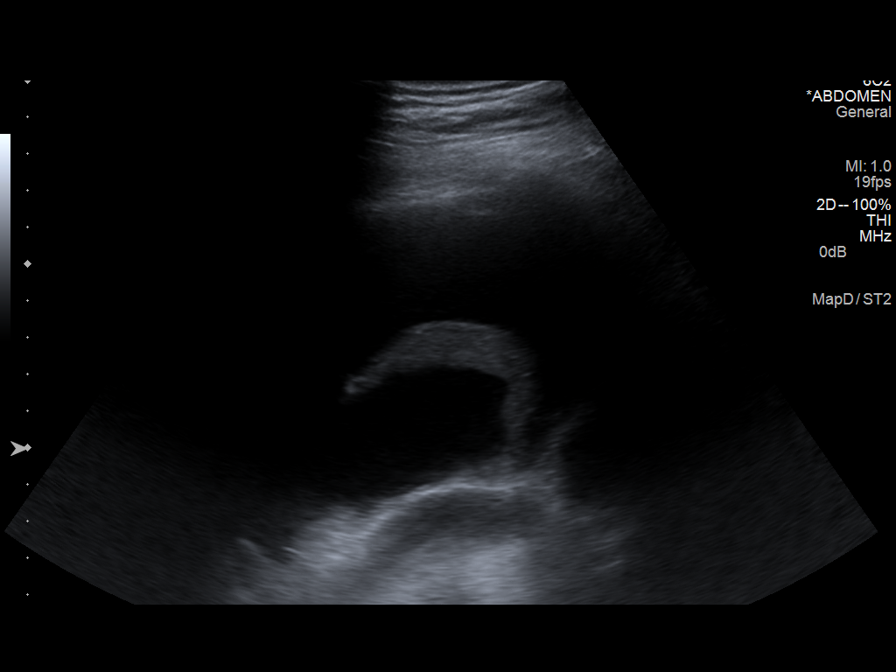
[im 6/8]
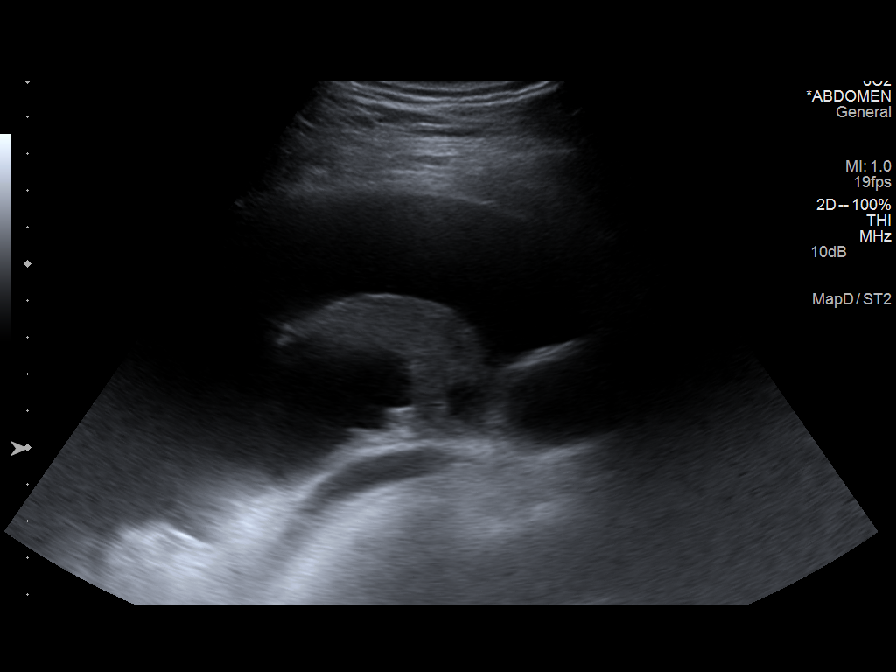
[im 7/8]
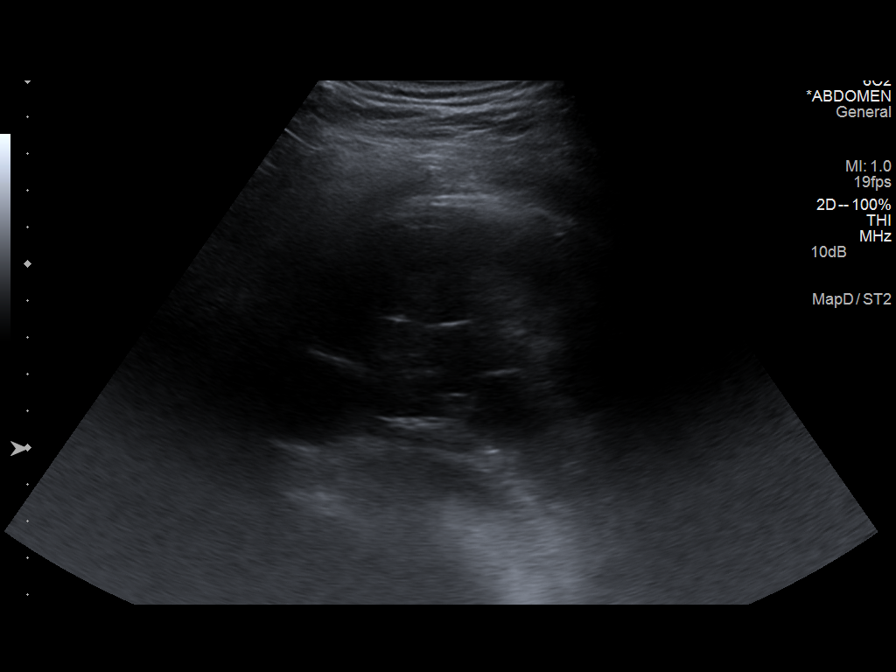
[im 8/8]
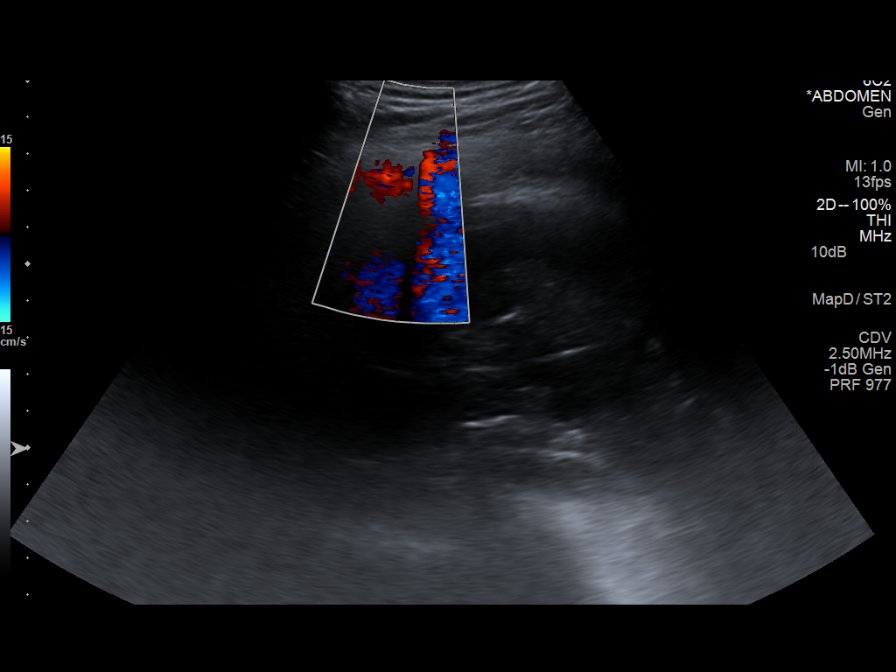

[8 of 8 positions shown; findings below may reference images not displayed]

FINDINGS: A total of approximately 850ml of dark bloody fluid was removed. A
fluid sample was notsent for laboratory analysis.
IMPRESSION: Successful ultrasound guided left thoracentesis yielding 850ml of
pleural fluid.

PROCEDURE:
An ultrasound guided thoracentesis was thoroughly discussed with the
patient and questions answered. The benefits, risks, alternatives
and complications were also discussed. The patient understands and
wishes to proceed with the procedure. Written consent was obtained.

Ultrasound was performed to localize and mark an adequate pocket of
fluid in the left chest. The area was then prepped and draped in the
normal sterile fashion. 1% Lidocaine was used for local anesthesia.
Under ultrasound guidance a 19 gauge Yueh catheter was introduced.
Thoracentesis was performed. The catheter was removed and a dressing
applied.

Complications:  none

## 2012-10-30 MED ORDER — ATORVASTATIN CALCIUM 20 MG PO TABS: 20.0000 mg | ORAL_TABLET | Freq: Every day | ORAL | Status: DC

## 2012-10-30 MED ORDER — OXYCODONE HCL 5 MG PO TABS: 5.0000 mg | ORAL_TABLET | ORAL | Status: DC | PRN

## 2012-10-30 MED ORDER — PREDNISONE (PAK) 10 MG PO TABS: 10.0000 mg | ORAL_TABLET | Freq: Four times a day (QID) | ORAL | Status: DC

## 2012-10-30 MED ORDER — PREDNISONE (PAK) 10 MG PO TABS: 20.0000 mg | ORAL_TABLET | Freq: Every evening | ORAL | Status: DC

## 2012-10-30 MED ORDER — PREDNISONE (PAK) 10 MG PO TABS
20.0000 mg | ORAL_TABLET | Freq: Every morning | ORAL | Status: AC
  Administered 2012-10-30: 20 mg via ORAL
  Filled 2012-10-30: qty 21

## 2012-10-30 MED ORDER — ASPIRIN 325 MG PO TBEC: 325.0000 mg | DELAYED_RELEASE_TABLET | Freq: Every day | ORAL | Status: DC

## 2012-10-30 MED ORDER — FUROSEMIDE 40 MG PO TABS: 40.0000 mg | ORAL_TABLET | Freq: Every day | ORAL | Status: DC

## 2012-10-30 MED ORDER — PREDNISONE (PAK) 10 MG PO TABS
10.0000 mg | ORAL_TABLET | ORAL | Status: AC
  Administered 2012-10-30: 10 mg via ORAL

## 2012-10-30 MED ORDER — NIACIN ER 500 MG PO CPCR: 500.0000 mg | ORAL_CAPSULE | Freq: Every day | ORAL | Status: DC

## 2012-10-30 MED ORDER — PREDNISONE (PAK) 10 MG PO TABS: 10.0000 mg | ORAL_TABLET | Freq: Three times a day (TID) | ORAL | Status: DC

## 2012-10-30 MED ORDER — POTASSIUM CHLORIDE CRYS ER 20 MEQ PO TBCR: 20.0000 meq | EXTENDED_RELEASE_TABLET | Freq: Every day | ORAL | Status: DC

## 2012-10-30 MED ORDER — AMIODARONE HCL 400 MG PO TABS: 400.0000 mg | ORAL_TABLET | Freq: Two times a day (BID) | ORAL | Status: DC

## 2012-10-30 MED FILL — Potassium Chloride Inj 2 mEq/ML: INTRAVENOUS | Qty: 40 | Status: AC

## 2012-10-30 MED FILL — Heparin Sodium (Porcine) Inj 1000 Unit/ML: INTRAMUSCULAR | Qty: 30 | Status: AC

## 2012-10-30 MED FILL — Sodium Chloride IV Soln 0.9%: INTRAVENOUS | Qty: 1000 | Status: AC

## 2012-10-30 MED FILL — Dexmedetomidine HCl IV Soln 200 MCG/2ML: INTRAVENOUS | Qty: 2 | Status: AC

## 2012-10-30 MED FILL — Magnesium Sulfate Inj 50%: INTRAMUSCULAR | Qty: 10 | Status: AC

## 2012-10-30 NOTE — Progress Notes (Signed)
Removed chest tube sutures per MD order. Placed steri strips and benzoin over site.

## 2012-10-30 NOTE — Progress Notes (Signed)
Discharge instructions given to pt along with discharge prescriptions. Pt and wife acknowledged their understanding of the information. Pt is stable for discharge with his wife.

## 2012-10-30 NOTE — Procedures (Signed)
Patient s/p CABG. Successful US guided left thoracentesis. Yielded of dark bloody fluid. Pt tolerated procedure well. No immediate complications.  Specimen was not sent for labs. CXR ordered.  Pattricia Boss D PA-C 10/30/2012 11:06 AM

## 2012-10-30 NOTE — Progress Notes (Signed)
4 Days Post-Op Procedure(s) (LRB): CORONARY ARTERY BYPASS GRAFTING (CABG) (N/A) Subjective: Didn't sleep well Sore all over  Objective: Vital signs in last 24 hours: Temp:  [97.6 F (36.4 C)-98.9 F (37.2 C)] 98.9 F (37.2 C) (10/27 0536) Pulse Rate:  [70-88] 88 (10/27 0536) Cardiac Rhythm:  [-] Normal sinus rhythm (10/26 1927) Resp:  [16-20] 20 (10/27 0536) BP: (95-143)/(62-81) 113/65 mmHg (10/27 0536) SpO2:  [96 %-98 %] 98 % (10/27 0536) Weight:  [211 lb 3.2 oz (95.8 kg)] 211 lb 3.2 oz (95.8 kg) (10/27 0536)  Hemodynamic parameters for last 24 hours:    Intake/Output from previous day: 10/26 0701 - 10/27 0700 In: 436.7 [P.O.:420; I.V.:16.7] Out: 1200 [Urine:1200] Intake/Output this shift:    General appearance: alert and no distress Neurologic: intact Heart: regular rate and rhythm Lungs: diminished breath sounds left base Wound: clean and dry  Lab Results:  Recent Labs  10/28/12 0400 10/29/12 0540  WBC 11.4* 15.0*  HGB 9.7* 10.4*  HCT 28.5* 29.0*  PLT 78* 126*   BMET:  Recent Labs  10/28/12 0400 10/29/12 0540  NA 133* 134*  K 4.2 4.0  CL 99 97  CO2 24 24  GLUCOSE 154* 140*  BUN 13 19  CREATININE 0.98 1.01  CALCIUM 8.1* 8.6    PT/INR: No results found for this basename: LABPROT, INR,  in the last 72 hours ABG    Component Value Date/Time   PHART 7.375 10/26/2012 2110   HCO3 23.4 10/26/2012 2110   TCO2 24 10/27/2012 1618   ACIDBASEDEF 1.0 10/26/2012 2110   O2SAT 98.0 10/26/2012 2110   CBG (last 3)   Recent Labs  10/29/12 1638 10/29/12 2140 10/30/12 0556  GLUCAP 126* 118* 150*    Assessment/Plan: S/P Procedure(s) (LRB): CORONARY ARTERY BYPASS GRAFTING (CABG) (N/A) - In sinus rhythm on amiodarone, metoprolol  CXR shows a left pleural effusion  Will have it drained in IR today  Prednisone taper  Possibly home later today after thoracentesis   LOS: 6 days    HENDRICKSON,STEVEN C 10/30/2012

## 2012-10-30 NOTE — Progress Notes (Signed)
Pt ambulated with RN. No SOB. Pt tolerated well.  No walker needed at this time.  Pt in room, up in chair, legs elevated & call bell within reach.  Will continue to monitor.

## 2012-10-30 NOTE — Progress Notes (Signed)
CARDIAC REHAB PHASE I   PRE:  Rate/Rhythm: 91 SR    BP: sitting 100/60    SaO2: 97 RA  MODE:  Ambulation 550 ft   POST:  Rate/Rhythm: 112 ST    BP: sitting 112/60     SaO2: 95-96 RA  Tolerated fairly well. Did not use RW, fairly steady, slow pace. Significant SOB but did not want to rest. Return to recliner. Ed completed with wife present. Requests his name be sent to [facility]. [phone]   Elissa Lovett [place] CES, ACSM 10/30/2012 9:13 AM

## 2012-10-30 NOTE — Progress Notes (Deleted)
Pt ambulated for the third time today with wife. Pt independently ambulates, no SOB.

## 2012-11-07 DIAGNOSIS — I10 Essential (primary) hypertension: Secondary | ICD-10-CM | POA: Insufficient documentation

## 2012-11-07 DIAGNOSIS — E785 Hyperlipidemia, unspecified: Secondary | ICD-10-CM | POA: Insufficient documentation

## 2012-11-17 ENCOUNTER — Other Ambulatory Visit: Payer: Self-pay | Admitting: *Deleted

## 2012-11-17 DIAGNOSIS — I2510 Atherosclerotic heart disease of native coronary artery without angina pectoris: Secondary | ICD-10-CM

## 2012-11-21 ENCOUNTER — Encounter: Payer: Self-pay | Admitting: Thoracic Surgery (Cardiothoracic Vascular Surgery)

## 2012-11-21 ENCOUNTER — Other Ambulatory Visit: Payer: Self-pay | Admitting: *Deleted

## 2012-11-21 ENCOUNTER — Ambulatory Visit
Admission: RE | Admit: 2012-11-21 | Discharge: 2012-11-21 | Disposition: A | Discharge status: Home / Self Care | Payer: Medicare Other | Source: Ambulatory Visit | Attending: Thoracic Surgery (Cardiothoracic Vascular Surgery) | Admitting: Thoracic Surgery (Cardiothoracic Vascular Surgery)

## 2012-11-21 ENCOUNTER — Ambulatory Visit (INDEPENDENT_AMBULATORY_CARE_PROVIDER_SITE_OTHER): Payer: Medicare Other | Admitting: Thoracic Surgery (Cardiothoracic Vascular Surgery)

## 2012-11-21 VITALS — BP 106/76 | HR 74 | Resp 20 | Ht 72.0 in | Wt 196.0 lb

## 2012-11-21 DIAGNOSIS — J90 Pleural effusion, not elsewhere classified: Secondary | ICD-10-CM

## 2012-11-21 DIAGNOSIS — I2510 Atherosclerotic heart disease of native coronary artery without angina pectoris: Secondary | ICD-10-CM

## 2012-11-21 DIAGNOSIS — Z951 Presence of aortocoronary bypass graft: Secondary | ICD-10-CM

## 2012-11-21 IMAGING — CR DG CHEST 2V
2 series · 2 of 2 positions shown · non-contrast
Comparison: [DATE].

CLINICAL DATA: Follow-up CABG, shortness of breath, chronic cough.

EXAM:
CHEST  2 VIEW

[w chest pa]
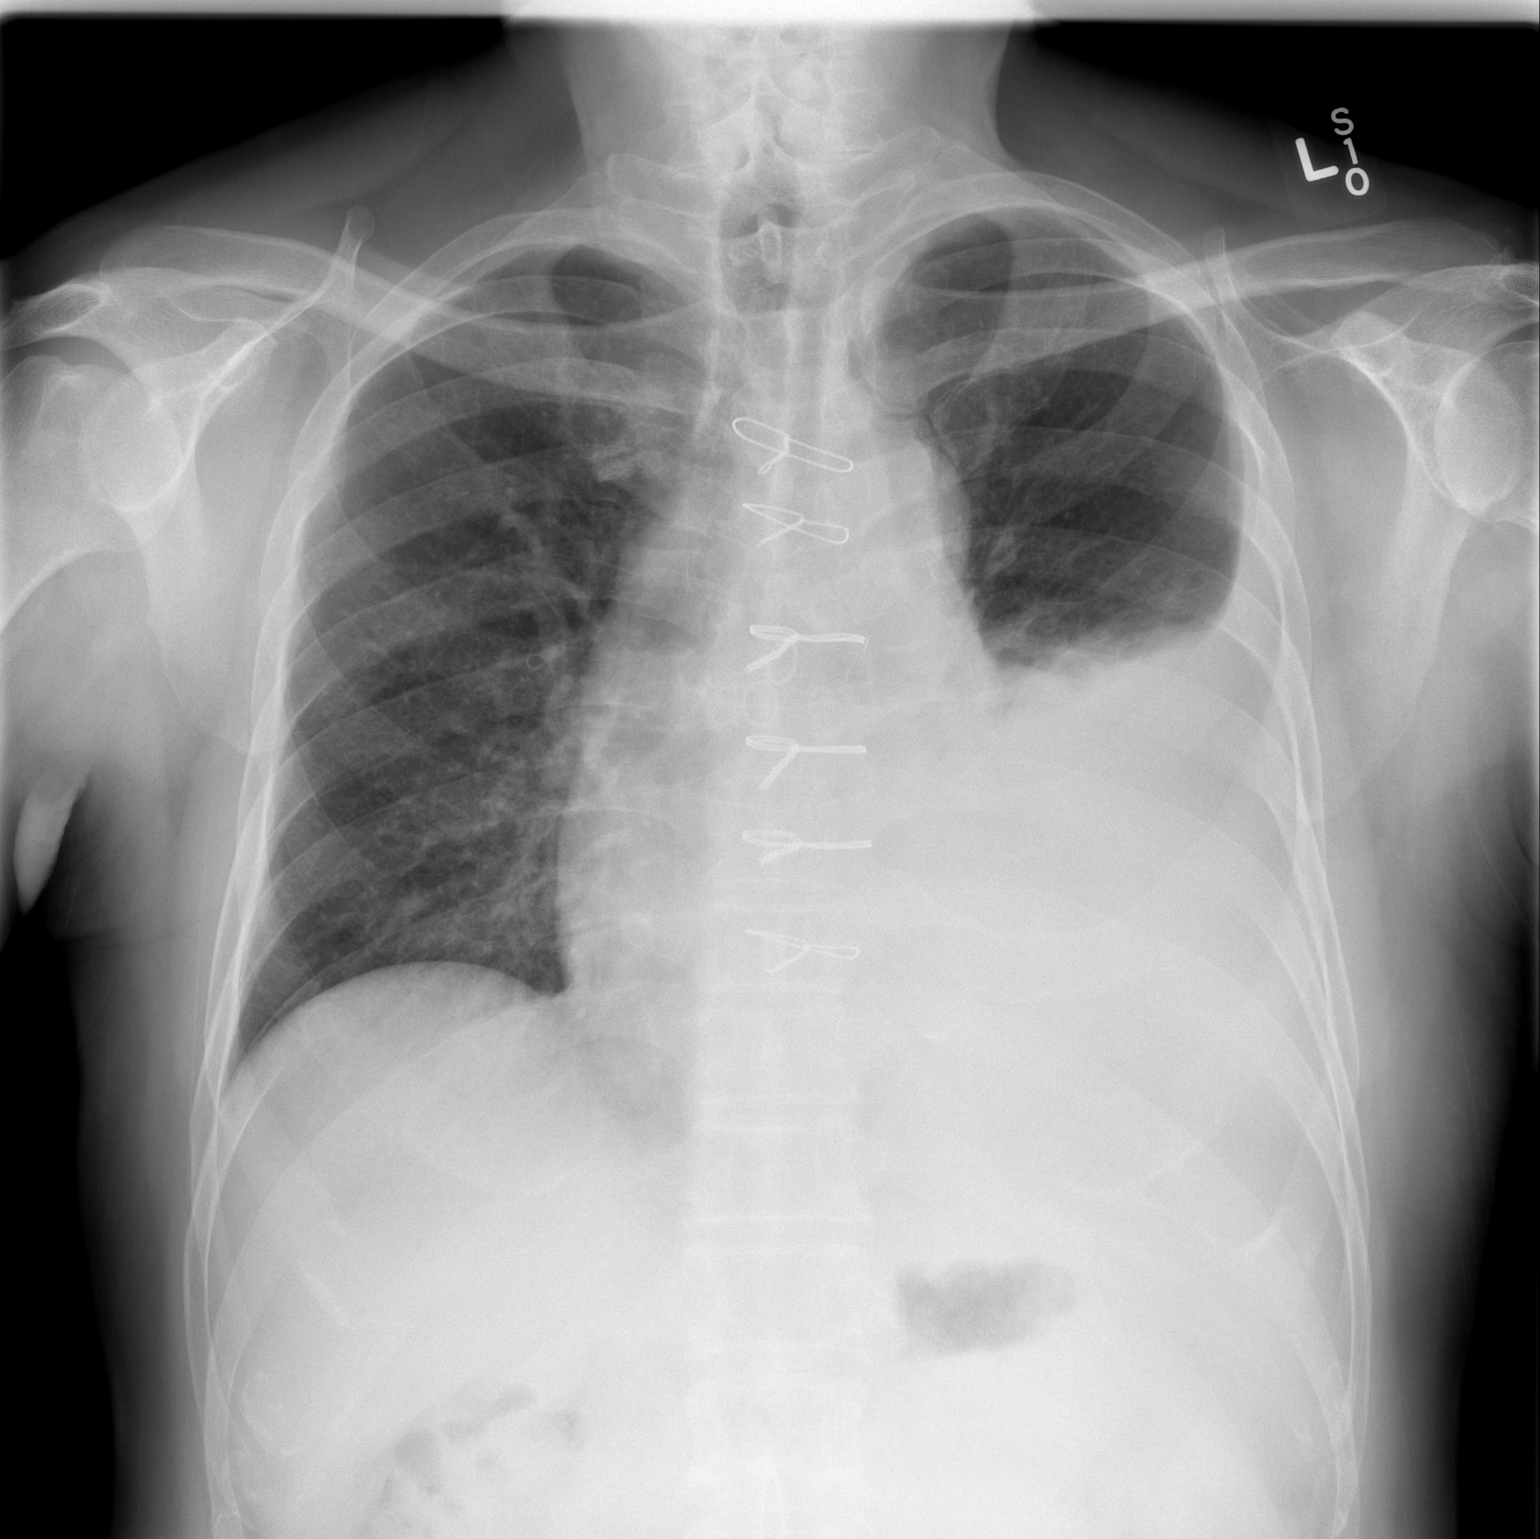

[w chest lat]
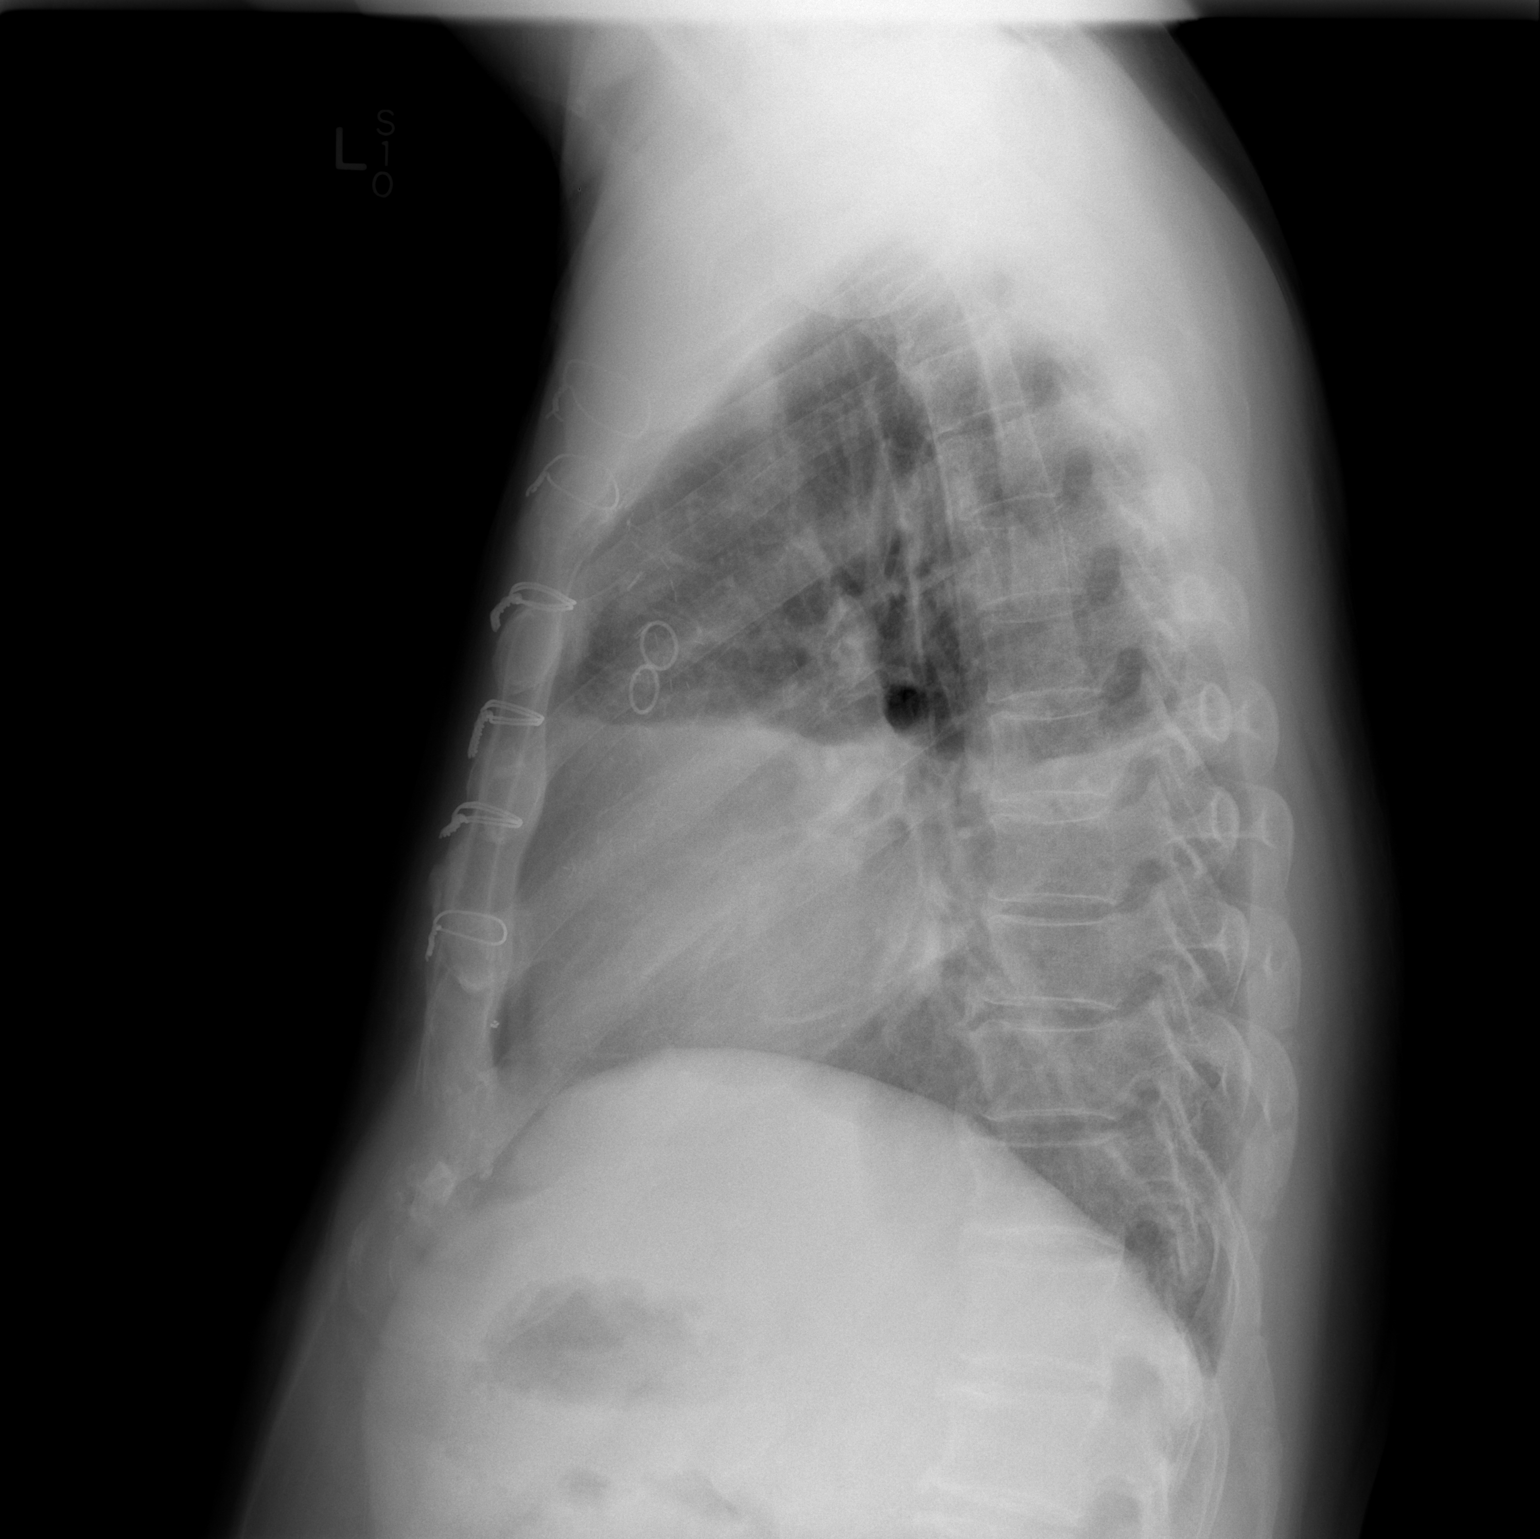

[2 of 2 positions shown; findings below may reference images not displayed]

FINDINGS: Stable heart and mediastinal contour worse. Moderate left pleural
effusion which is significantly increased compared with the prior
exam. The right lung is clear. There is evidence of prior CABG.
IMPRESSION: Moderate left pleural effusion significantly increased compared with
[DATE].

## 2012-11-21 IMAGING — CR DG CHEST 2V SAME DAY
3 series · 3 of 3 positions shown · non-contrast
Comparison: Chest x-ray of [DATE]

CLINICAL DATA: Post thoracentesis, followup

EXAM:
CHEST - 2 VIEW SAME DAY

[w chest pa]
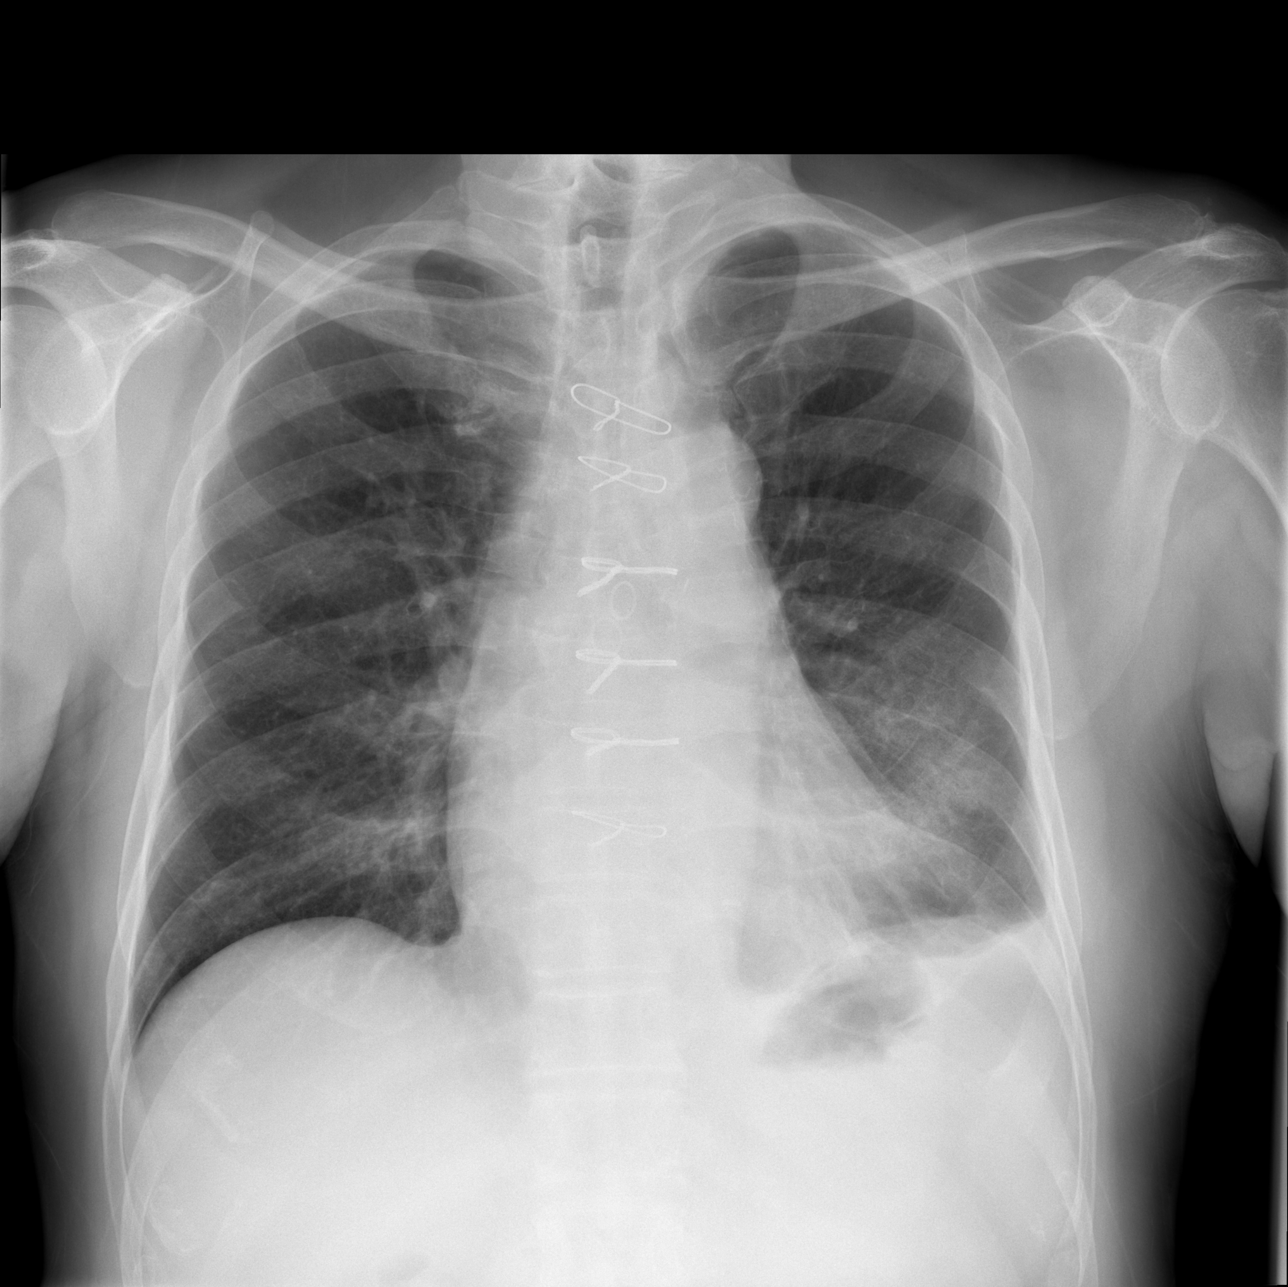

[w chest lat (1 of 2)]
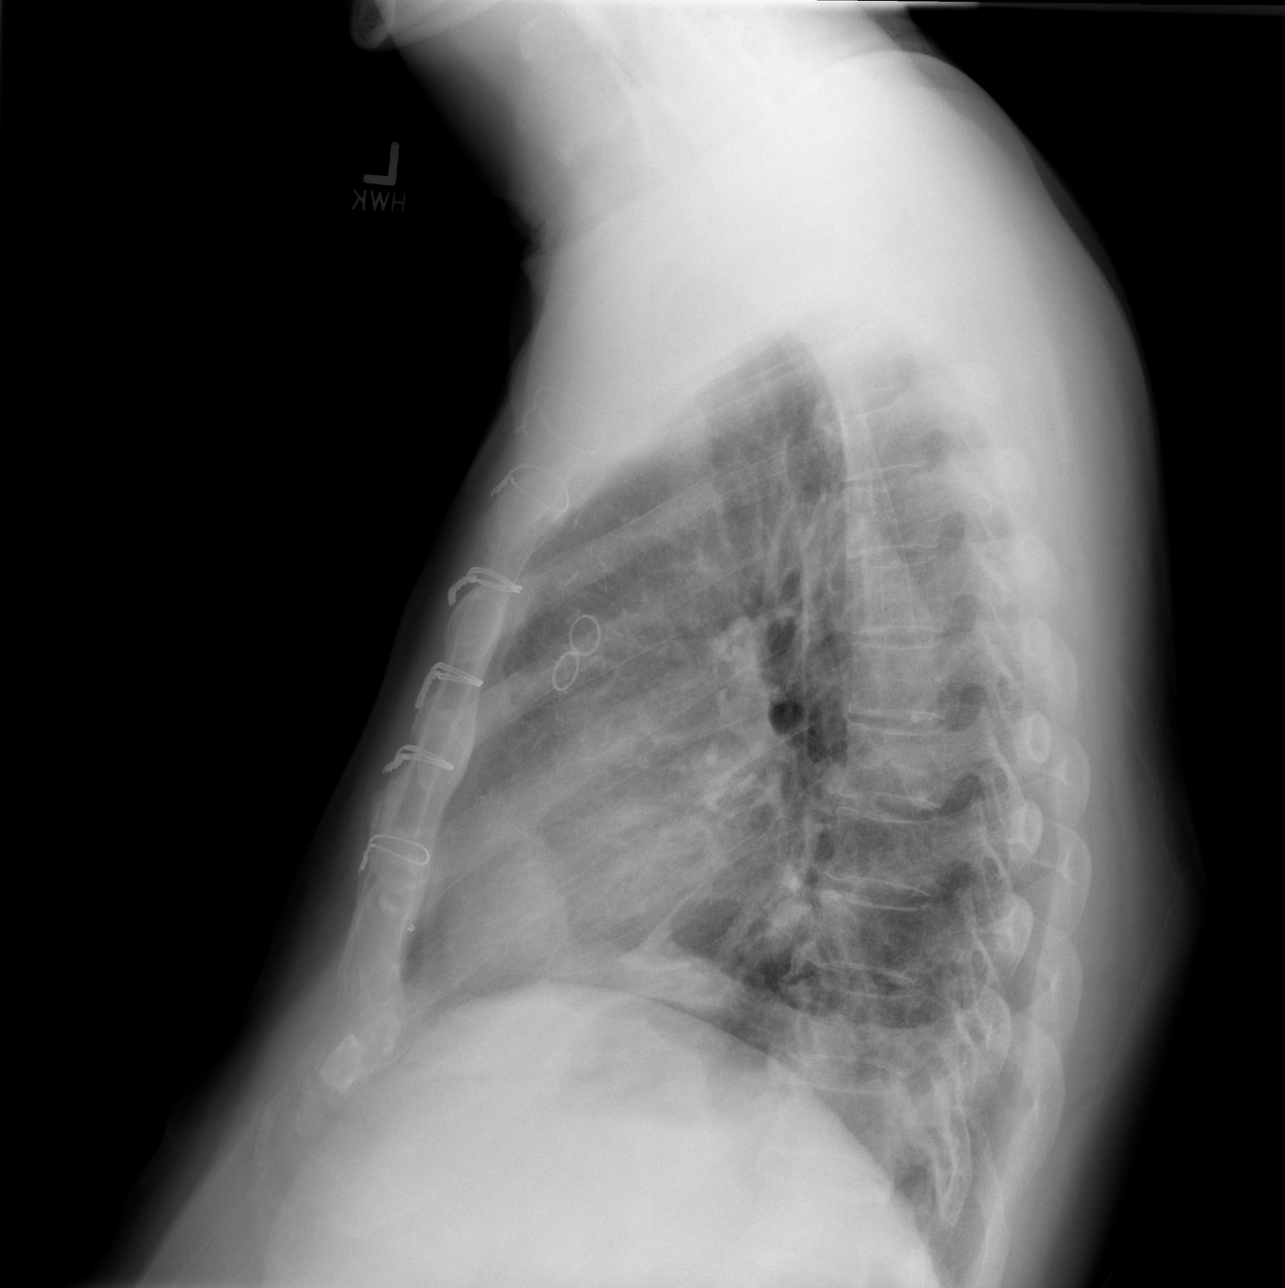

[w chest lat (2 of 2)]
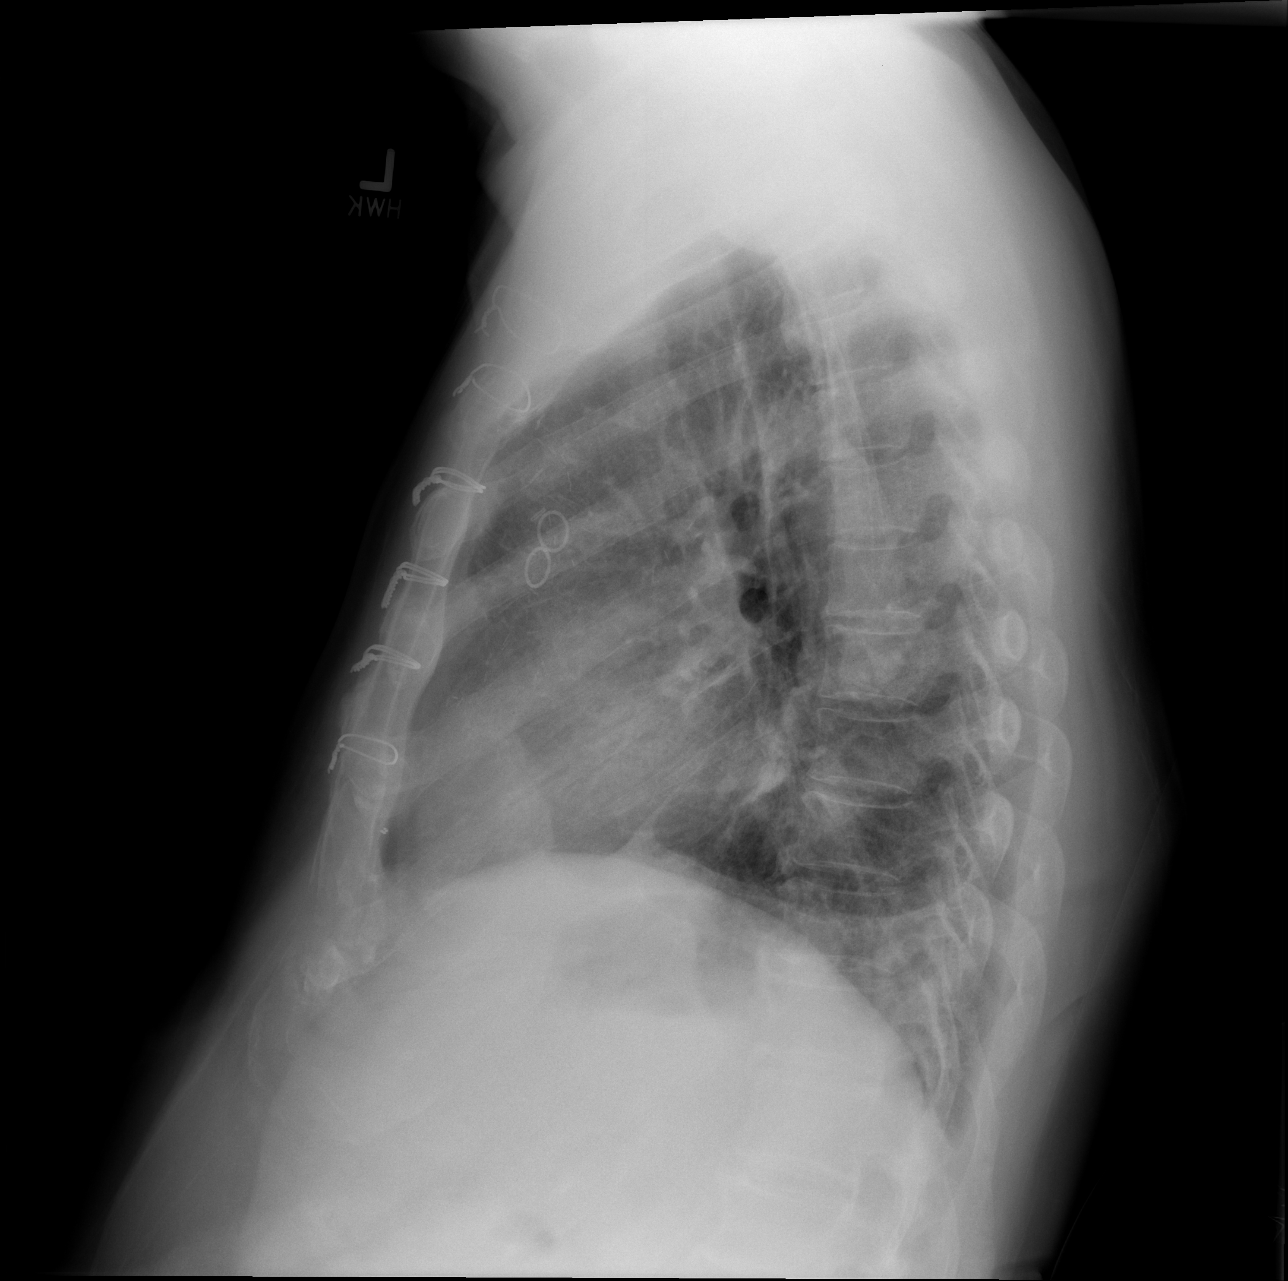

[3 of 3 positions shown; findings below may reference images not displayed]

FINDINGS: After left thoracentesis the majority of the left pleural effusion
noted earlier has been evacuated. No pneumothorax is seen. Mild left
basilar linear atelectasis remains. The right lung is clear.
Cardiomegaly is stable.
IMPRESSION: Evacuation of much of the left pleural effusion after left
thoracentesis. No pneumothorax.

## 2012-11-21 MED ORDER — PREDNISONE (PAK) 10 MG PO TABS: ORAL_TABLET | ORAL | Status: DC

## 2012-11-21 NOTE — Progress Notes (Signed)
HPI:  [name] is a 67 year old gentleman who presents for a scheduled postoperative followup visit. He had coronary bypass grafting x3 on October 23. Postoperatively he did well. He had some atrial fibrillation and was discharged on amiodarone. He also had a left pleural effusion and had a thoracentesis draining 850 mL prior to discharge October 27. He now returns for a scheduled visit.  He says he is having only small amounts of pain. He is not taking narcotics. He says he feels well in the sitting or standing, but feels short of breath when he lies down. He also has a frequent cough when he tries to lie down. He had been taking Ambien to help him sleep. He is currently taking Lasix once daily.  Past Medical History  Diagnosis Date  . Coronary artery disease   . Hypertension   . High cholesterol   . Type II diabetes mellitus   . GERD (gastroesophageal reflux disease)   . Arthritis     "lower back" (10/24/2012)      Current Outpatient Prescriptions  Medication Sig Dispense Refill  . amiodarone (PACERONE) 400 MG tablet Take 200 mg by mouth 2 (two) times daily.      Marland Kitchen aspirin EC 325 MG EC tablet Take 1 tablet (325 mg total) by mouth daily.  30 tablet  0  . atorvastatin (LIPITOR) 20 MG tablet Take 1 tablet (20 mg total) by mouth daily at 6 PM.  30 tablet  1  . furosemide (LASIX) 40 MG tablet Take 40 mg by mouth daily.      Marland Kitchen glucosamine-chondroitin 500-400 MG tablet Take 1 tablet by mouth daily.      . metoprolol succinate (TOPROL-XL) 100 MG 24 hr tablet Take 100 mg by mouth daily.      . niacin 500 MG CR capsule Take 1 capsule (500 mg total) by mouth at bedtime.  30 capsule  1  . omeprazole (PRILOSEC) 20 MG capsule Take 20 mg by mouth every morning.      Marland Kitchen oxyCODONE (OXY IR/ROXICODONE) 5 MG immediate release tablet Take 1-2 tablets (5-10 mg total) by mouth every 3 (three) hours as needed.  40 tablet  0  . potassium chloride SA (K-DUR,KLOR-CON) 20 MEQ tablet Take 20 mEq by mouth daily.       . saxagliptin HCl (ONGLYZA) 2.5 MG TABS tablet Take 2.5 mg by mouth daily.      Marland Kitchen zolpidem (AMBIEN) 10 MG tablet Take 10 mg by mouth at bedtime as needed for sleep.      . predniSONE (STERAPRED UNI-PAK) 10 MG tablet Take 5 tablets on day 1, 4 tablets on day 2, 3 tablets on day 3, 2 tablets on day 4, 1 tablet on day 5, then stop  15 tablet  0   No current facility-administered medications for this visit.    Physical Exam BP 106/76  Pulse 74  Resp 20  Ht 6' (1.829 m)  Wt 196 lb (88.905 kg)  BMI 26.58 kg/m2  SpO54 42% 67 year old male in no acute distress Neuro alert and oriented x3 with no focal deficits Lungs absent breath sounds and dullness to percussion at left base Cardiac regular rate and rhythm normal S1 and S2, no rub Sternum stable, incision healing well Chest tube site with skin separation and exudate Leg incision healing well, no peripheral edema  Diagnostic Tests: Chest x-ray 11/21/2012 Large left pleural effusion  Impression: 67 year old gentleman who is now about 3 weeks post coronary bypass grafting x3. He  has a recurrent left pleural effusion. This is fairly large, probably 1.5-2 L in volume. He needs a thoracentesis today.  Otherwise he is doing well. He is maintaining sinus rhythm. I did think we should leave him on the amiodarone for another 3 weeks or so.  Is having minimal discomfort is not having to use the oxycodone.  He may begin driving, appropriate precautions were discussed. He's not to lift anything over 10 pounds until after he see me back in 3 weeks  Plan: Left thoracentesis Continue Lasix Prednisone taper Return in 3 weeks with repeat chest x-ray  Procedure note  Using sterile technique and 1% lidocaine local anesthetic (5 mL) left thoracentesis was performed. 1750 mL's of serosanguineous fluid was evacuated. Patient did experience some right shoulder discomfort and coughing, but no lightheadedness.  We will send him down for a chest  x-ray before he goes home today. We will see him back in 3 weeks with a repeat chest x-ray.

## 2012-12-07 ENCOUNTER — Other Ambulatory Visit: Payer: Self-pay | Admitting: *Deleted

## 2012-12-07 DIAGNOSIS — I2510 Atherosclerotic heart disease of native coronary artery without angina pectoris: Secondary | ICD-10-CM

## 2012-12-12 ENCOUNTER — Ambulatory Visit
Admission: RE | Admit: 2012-12-12 | Discharge: 2012-12-12 | Disposition: A | Discharge status: Home / Self Care | Payer: Medicare Other | Source: Ambulatory Visit | Attending: Thoracic Surgery (Cardiothoracic Vascular Surgery) | Admitting: Thoracic Surgery (Cardiothoracic Vascular Surgery)

## 2012-12-12 ENCOUNTER — Ambulatory Visit (INDEPENDENT_AMBULATORY_CARE_PROVIDER_SITE_OTHER): Payer: Medicare Other | Admitting: Thoracic Surgery (Cardiothoracic Vascular Surgery)

## 2012-12-12 VITALS — BP 127/83 | HR 72 | Resp 16 | Ht 72.0 in | Wt 189.4 lb

## 2012-12-12 DIAGNOSIS — Z951 Presence of aortocoronary bypass graft: Secondary | ICD-10-CM

## 2012-12-12 DIAGNOSIS — I2510 Atherosclerotic heart disease of native coronary artery without angina pectoris: Secondary | ICD-10-CM

## 2012-12-12 DIAGNOSIS — J90 Pleural effusion, not elsewhere classified: Secondary | ICD-10-CM

## 2012-12-12 IMAGING — CR DG CHEST 2V
2 series · 2 of 2 positions shown · non-contrast
Comparison: [DATE]

CLINICAL DATA: Post CABG [DATE], followup, history coronary
artery disease, diabetes, hypertension

EXAM:
CHEST  2 VIEW

[view not recorded (1 of 2)]
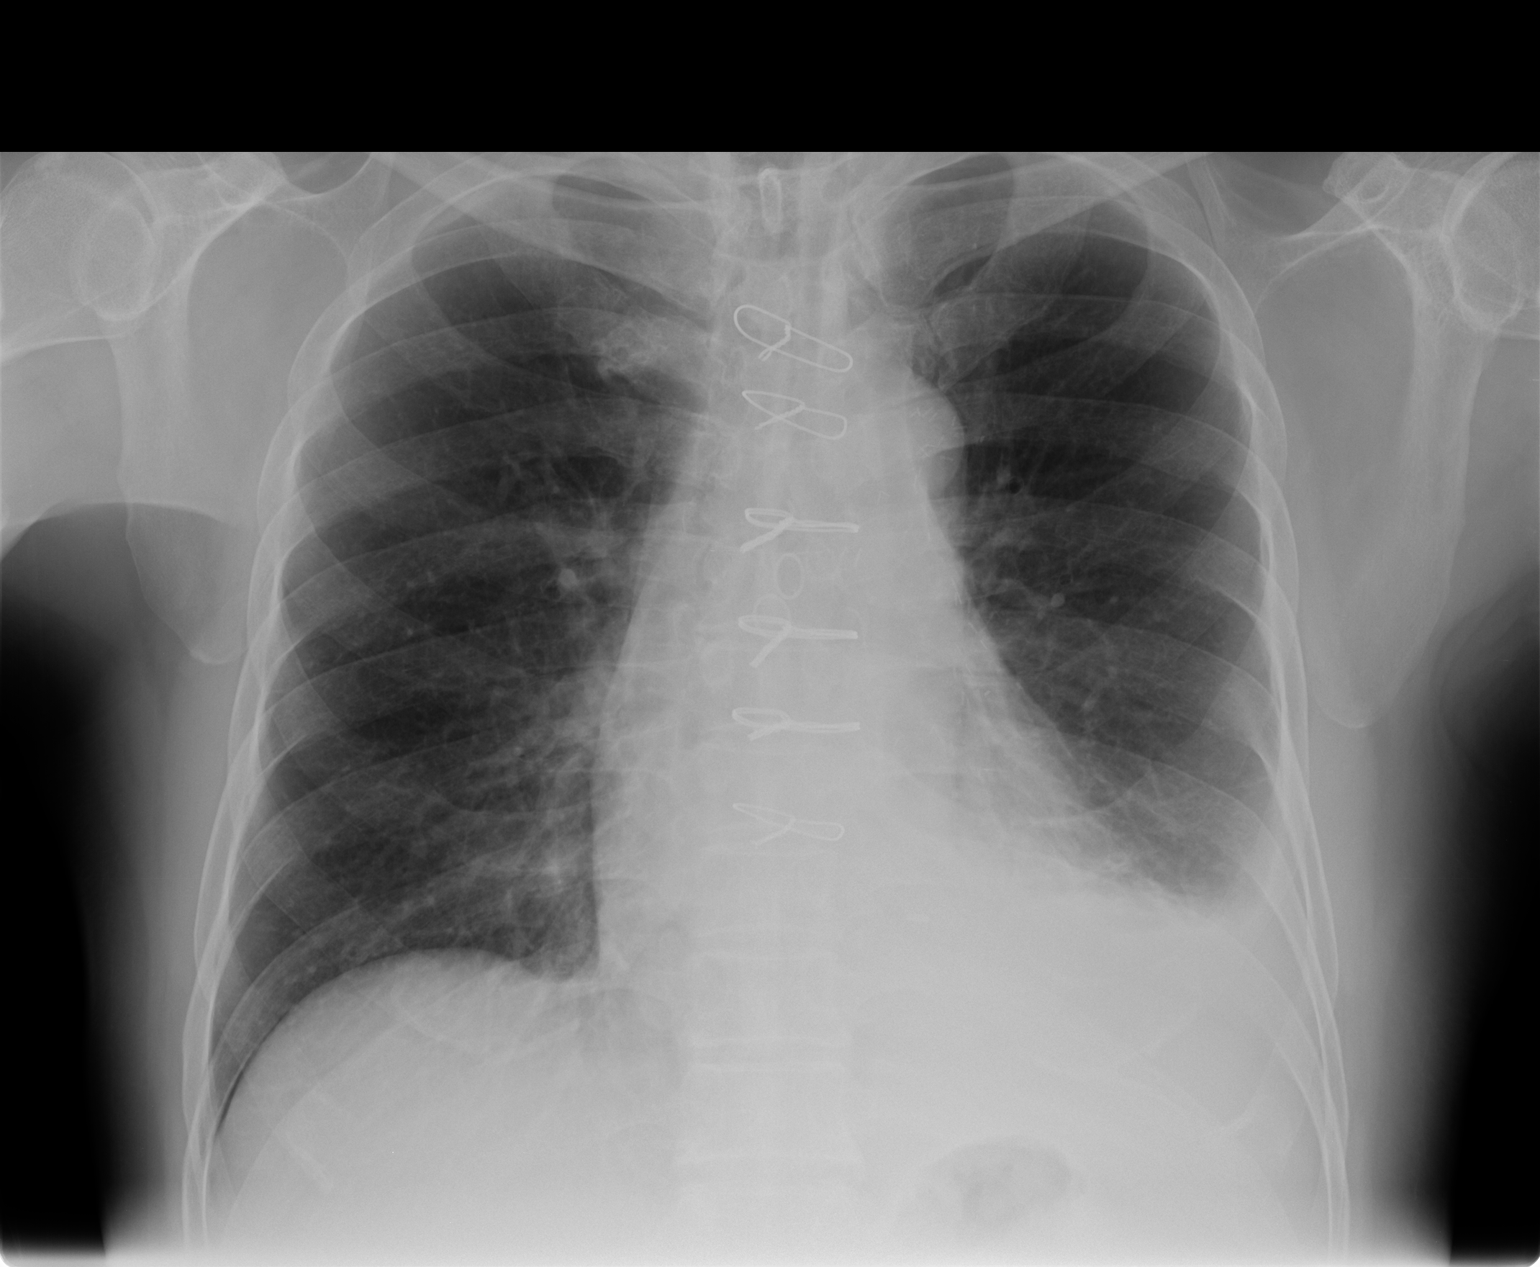

[view not recorded (2 of 2)]
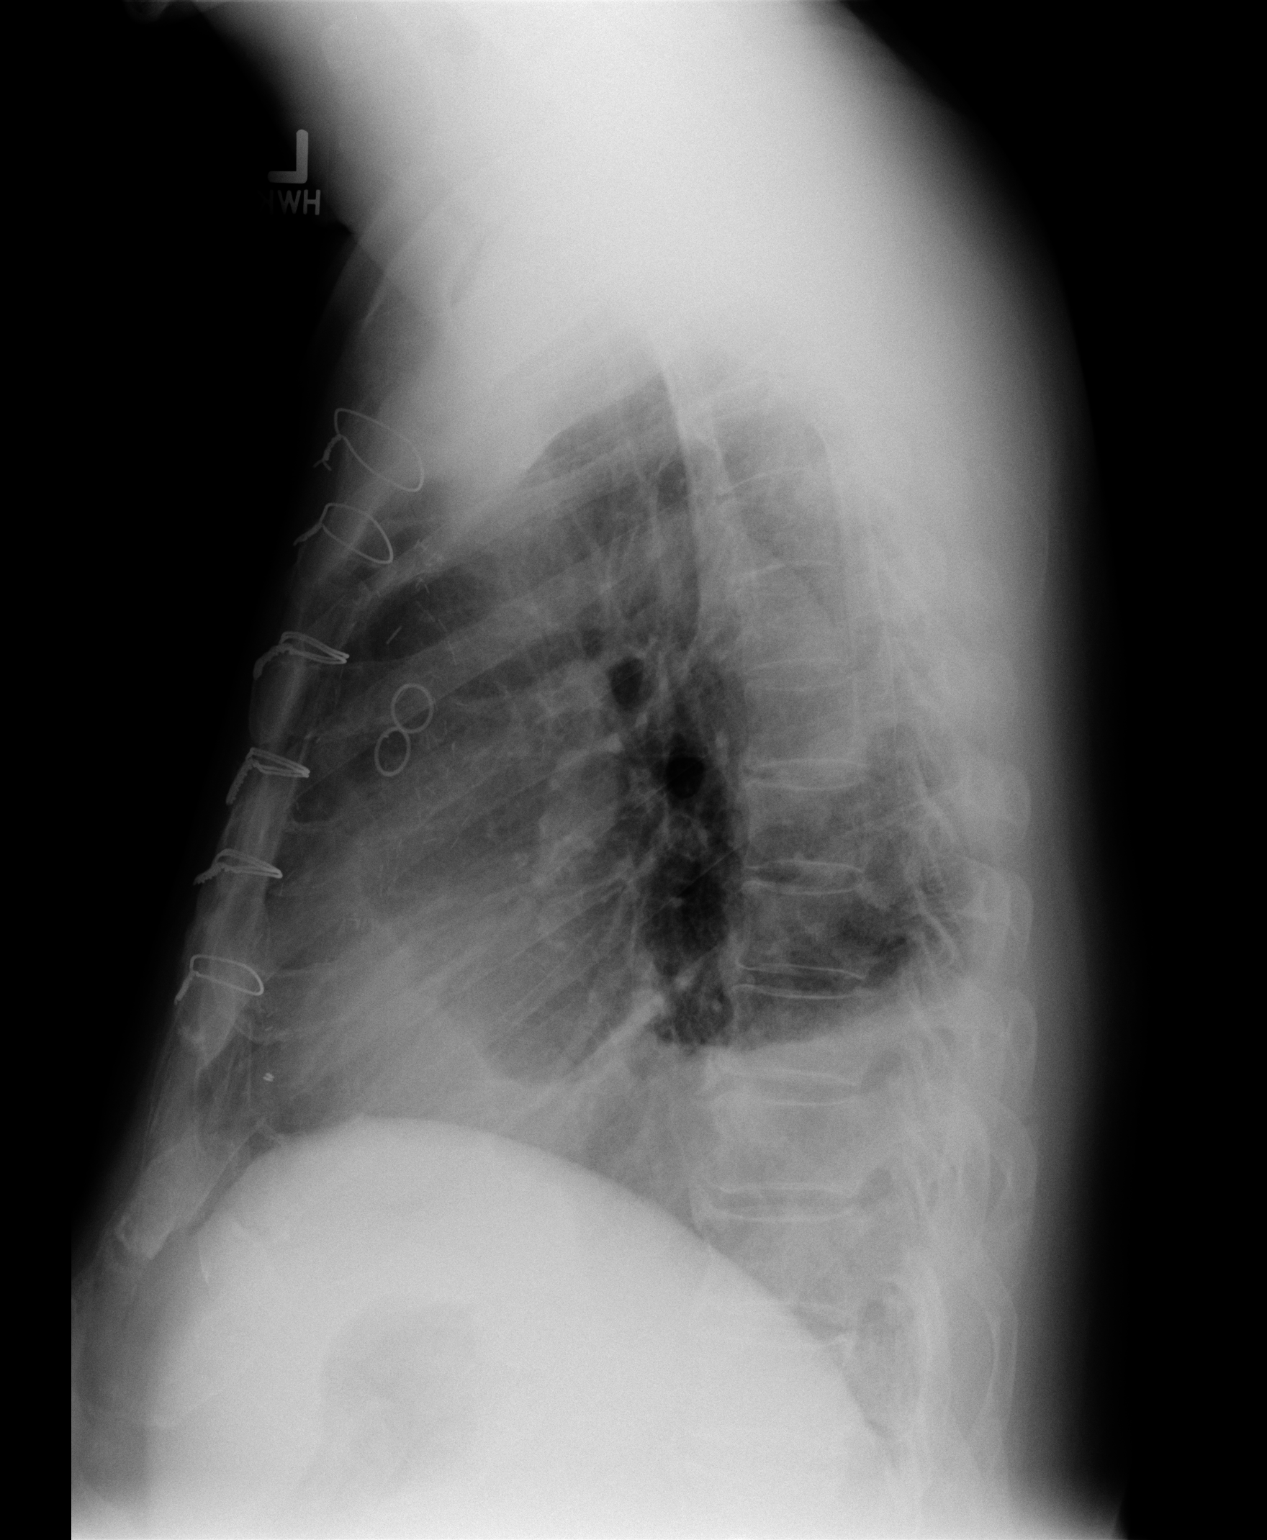

[2 of 2 positions shown; findings below may reference images not displayed]

FINDINGS: Upper normal heart size post CABG.

Mediastinal contours and pulmonary vascularity normal.

Increase in left pleural effusion and basilar atelectasis since
previous exam.

Lungs otherwise clear.

No pleural effusion or pneumothorax.

Osseous structures unremarkable.
IMPRESSION: Post CABG.

Increased left basilar pleural effusion atelectasis.

## 2012-12-12 MED ORDER — PREDNISONE (PAK) 10 MG PO TABS: ORAL_TABLET | ORAL | Status: DC

## 2012-12-12 NOTE — Progress Notes (Signed)
HPI:  [name] is a 67 year old gentleman who presents for a scheduled postoperative followup visit. He had coronary bypass grafting x3 on October 23. Postoperatively he did well. He had some atrial fibrillation and was discharged on amiodarone. He also had a left pleural effusion and had a thoracentesis draining 850 mL prior to discharge October 27. He now returns for a scheduled visit.  I saw him in the office on November 18. At that time he was having difficulty breathing lying down. He had a large left pleural effusion. We did a thoracentesis in the office and drained 1.75 L. He noticed immediate improvement in his breathing capacity after the drainage. We gave him a steroid taper. He now returns for a followup.  He says that he saw Dr. Ellsworth Murray. His blood pressure was low at that visit and he stopped his Toprol. He is still taking amiodarone 200 mg twice a day.  He says that his breathing is fine is not having any shortness of breath or orthopnea. He doesn't have any incisional pain, but does have muscular aches and pains in his neck and shoulders. He has not had any anginal.  Past Medical History  Diagnosis Date  . Coronary artery disease   . Hypertension   . High cholesterol   . Type II diabetes mellitus   . GERD (gastroesophageal reflux disease)   . Arthritis     "lower back" (10/24/2012)      Current Outpatient Prescriptions  Medication Sig Dispense Refill  . aspirin EC 325 MG EC tablet Take 1 tablet (325 mg total) by mouth daily.  30 tablet  0  . atorvastatin (LIPITOR) 20 MG tablet Take 1 tablet (20 mg total) by mouth daily at 6 PM.  30 tablet  1  . furosemide (LASIX) 40 MG tablet Take 40 mg by mouth daily.      Marland Kitchen glucosamine-chondroitin 500-400 MG tablet Take 1 tablet by mouth daily.      . niacin 500 MG CR capsule Take 1 capsule (500 mg total) by mouth at bedtime.  30 capsule  1  . omeprazole (PRILOSEC) 20 MG capsule Take 20 mg by mouth every morning.      . potassium  chloride SA (K-DUR,KLOR-CON) 20 MEQ tablet Take 20 mEq by mouth daily.      . saxagliptin HCl (ONGLYZA) 2.5 MG TABS tablet Take 2.5 mg by mouth daily.      Marland Kitchen zolpidem (AMBIEN) 10 MG tablet Take 10 mg by mouth at bedtime as needed for sleep.      . fluocinonide cream (LIDEX) 0.05 %       . predniSONE (STERAPRED UNI-PAK) 10 MG tablet Take 5 tablets on day 1, 4 tablets on day 2, 3 tablets on day 3, 2 tablets on day 4, 1 tablet on day 5, then stop  15 tablet  0   No current facility-administered medications for this visit.    Physical Exam BP 127/83  Pulse 72  Resp 16  Ht 6' (1.829 m)  Wt 189 lb 6.4 oz (85.911 kg)  BMI 25.68 kg/m2  SpO79 33% 67 year old male in no acute distress Alert and oriented Cardiac regular rate and rhythm normal S1 and S2 Lungs diminished at left base, otherwise clear Sternum stable, incision healing well No peripheral edema  Diagnostic Tests: Chest x-ray COMPARISON: 11/21/2012  FINDINGS:  Upper normal heart size post CABG.  Mediastinal contours and pulmonary vascularity normal.  Increase in left pleural effusion and basilar atelectasis since  previous  exam.  Lungs otherwise clear.  No pleural effusion or pneumothorax.  Osseous structures unremarkable.  IMPRESSION:  Post CABG.  Increased left basilar pleural effusion atelectasis.  Electronically Signed  By: Ulyses Southward M.D.  On: 12/12/2012 12:46  Impression: 67 year old gentleman who is now about 6 weeks out from coronary bypass grafting. He has had thoracentesis on 2 occasions for left pleural effusion. First I was in the hospital and a little less than a liter was drained. 3 weeks ago in the office we drained 1.75 L. He has had a partial recurrence. This is not nearly as large as it was prior to thoracentesis. He probably has about one third as much fluid at this point as he had then.  I do not think he needs thoracentesis this point. I do think he may benefit from a repeat of his steroid taper. I will  put him a prescription for that.  He looks and feels well. The pleural effusion is the only remaining postoperative issue.  He has not had any atrial fibrillation since discharge the hospital. I told him it would be okay to stop his amiodarone this point he is now 6 weeks postop.  Plan: Prednisone taper  DC amiodarone  Continue Lasix and potassium  Return in 3 weeks with a PA and lateral chest x-ray to followup left effusion.

## 2012-12-22 ENCOUNTER — Other Ambulatory Visit: Payer: Self-pay | Admitting: Physician Assistant

## 2013-01-02 ENCOUNTER — Ambulatory Visit: Payer: Medicare Other | Admitting: Thoracic Surgery (Cardiothoracic Vascular Surgery)

## 2013-01-16 ENCOUNTER — Encounter: Payer: Self-pay | Admitting: Cardiovascular Disease

## 2013-01-23 DIAGNOSIS — N200 Calculus of kidney: Secondary | ICD-10-CM | POA: Insufficient documentation

## 2013-02-04 ENCOUNTER — Encounter: Payer: Self-pay | Admitting: Cardiovascular Disease

## 2013-03-04 ENCOUNTER — Encounter: Payer: Self-pay | Admitting: Cardiovascular Disease

## 2013-04-04 ENCOUNTER — Encounter: Payer: Self-pay | Admitting: Cardiovascular Disease

## 2013-05-04 ENCOUNTER — Encounter: Payer: Self-pay | Admitting: Cardiovascular Disease

## 2013-12-13 ENCOUNTER — Ambulatory Visit (INDEPENDENT_AMBULATORY_CARE_PROVIDER_SITE_OTHER): Payer: Medicare HMO | Admitting: Podiatry

## 2013-12-13 ENCOUNTER — Encounter: Payer: Self-pay | Admitting: Podiatry

## 2013-12-13 VITALS — BP 117/66 | HR 62 | Resp 16 | Ht 72.0 in | Wt 209.0 lb

## 2013-12-13 DIAGNOSIS — M722 Plantar fascial fibromatosis: Secondary | ICD-10-CM

## 2013-12-13 DIAGNOSIS — M79671 Pain in right foot: Secondary | ICD-10-CM

## 2013-12-13 NOTE — Patient Instructions (Signed)
Plantar Fasciitis (Heel Spur Syndrome) with Rehab The plantar fascia is a fibrous, ligament-like, soft-tissue structure that spans the bottom of the foot. Plantar fasciitis is a condition that causes pain in the foot due to inflammation of the tissue. SYMPTOMS   Pain and tenderness on the underneath side of the foot.  Pain that worsens with standing or walking. CAUSES  Plantar fasciitis is caused by irritation and injury to the plantar fascia on the underneath side of the foot. Common mechanisms of injury include:  Direct trauma to bottom of the foot.  Damage to a small nerve that runs under the foot where the main fascia attaches to the heel bone.  Stress placed on the plantar fascia due to bone spurs. RISK INCREASES WITH:   Activities that place stress on the plantar fascia (running, jumping, pivoting, or cutting).  Poor strength and flexibility.  Improperly fitted shoes.  Tight calf muscles.  Flat feet.  Failure to warm-up properly before activity.  Obesity. PREVENTION  Warm up and stretch properly before activity.  Allow for adequate recovery between workouts.  Maintain physical fitness:  Strength, flexibility, and endurance.  Cardiovascular fitness.  Maintain a health body weight.  Avoid stress on the plantar fascia.  Wear properly fitted shoes, including arch supports for individuals who have flat feet. PROGNOSIS  If treated properly, then the symptoms of plantar fasciitis usually resolve without surgery. However, occasionally surgery is necessary. RELATED COMPLICATIONS   Recurrent symptoms that may result in a chronic condition.  Problems of the lower back that are caused by compensating for the injury, such as limping.  Pain or weakness of the foot during push-off following surgery.  Chronic inflammation, scarring, and partial or complete fascia tear, occurring more often from repeated injections. TREATMENT  Treatment initially involves the use of  ice and medication to help reduce pain and inflammation. The use of strengthening and stretching exercises may help reduce pain with activity, especially stretches of the Achilles tendon. These exercises may be performed at home or with a therapist. Your caregiver may recommend that you use heel cups of arch supports to help reduce stress on the plantar fascia. Occasionally, corticosteroid injections are given to reduce inflammation. If symptoms persist for greater than 6 months despite non-surgical (conservative), then surgery may be recommended.  MEDICATION   If pain medication is necessary, then nonsteroidal anti-inflammatory medications, such as aspirin and ibuprofen, or other minor pain relievers, such as acetaminophen, are often recommended.  Do not take pain medication within 7 days before surgery.  Prescription pain relievers may be given if deemed necessary by your caregiver. Use only as directed and only as much as you need.  Corticosteroid injections may be given by your caregiver. These injections should be reserved for the most serious cases, because they may only be given a certain number of times. HEAT AND COLD  Cold treatment (icing) relieves pain and reduces inflammation. Cold treatment should be applied for 10 to 15 minutes every 2 to 3 hours for inflammation and pain and immediately after any activity that aggravates your symptoms. Use ice packs or massage the area with a piece of ice (ice massage).  Heat treatment may be used prior to performing the stretching and strengthening activities prescribed by your caregiver, physical therapist, or athletic trainer. Use a heat pack or soak the injury in warm water. SEEK IMMEDIATE MEDICAL CARE IF:  Treatment seems to offer no benefit, or the condition worsens.  Any medications produce adverse side effects. EXERCISES RANGE   OF MOTION (ROM) AND STRETCHING EXERCISES - Plantar Fasciitis (Heel Spur Syndrome) These exercises may help you  when beginning to rehabilitate your injury. Your symptoms may resolve with or without further involvement from your physician, physical therapist or athletic trainer. While completing these exercises, remember:   Restoring tissue flexibility helps normal motion to return to the joints. This allows healthier, less painful movement and activity.  An effective stretch should be held for at least 30 seconds.  A stretch should never be painful. You should only feel a gentle lengthening or release in the stretched tissue. RANGE OF MOTION - Toe Extension, Flexion  Sit with your right / left leg crossed over your opposite knee.  Grasp your toes and gently pull them back toward the top of your foot. You should feel a stretch on the bottom of your toes and/or foot.  Hold this stretch for __________ seconds.  Now, gently pull your toes toward the bottom of your foot. You should feel a stretch on the top of your toes and or foot.  Hold this stretch for __________ seconds. Repeat __________ times. Complete this stretch __________ times per day.  RANGE OF MOTION - Ankle Dorsiflexion, Active Assisted  Remove shoes and sit on a chair that is preferably not on a carpeted surface.  Place right / left foot under knee. Extend your opposite leg for support.  Keeping your heel down, slide your right / left foot back toward the chair until you feel a stretch at your ankle or calf. If you do not feel a stretch, slide your bottom forward to the edge of the chair, while still keeping your heel down.  Hold this stretch for __________ seconds. Repeat __________ times. Complete this stretch __________ times per day.  STRETCH - Gastroc, Standing  Place hands on wall.  Extend right / left leg, keeping the front knee somewhat bent.  Slightly point your toes inward on your back foot.  Keeping your right / left heel on the floor and your knee straight, shift your weight toward the wall, not allowing your back to  arch.  You should feel a gentle stretch in the right / left calf. Hold this position for __________ seconds. Repeat __________ times. Complete this stretch __________ times per day. STRETCH - Soleus, Standing  Place hands on wall.  Extend right / left leg, keeping the other knee somewhat bent.  Slightly point your toes inward on your back foot.  Keep your right / left heel on the floor, bend your back knee, and slightly shift your weight over the back leg so that you feel a gentle stretch deep in your back calf.  Hold this position for __________ seconds. Repeat __________ times. Complete this stretch __________ times per day. STRETCH - Gastrocsoleus, Standing  Note: This exercise can place a lot of stress on your foot and ankle. Please complete this exercise only if specifically instructed by your caregiver.   Place the ball of your right / left foot on a step, keeping your other foot firmly on the same step.  Hold on to the wall or a rail for balance.  Slowly lift your other foot, allowing your body weight to press your heel down over the edge of the step.  You should feel a stretch in your right / left calf.  Hold this position for __________ seconds.  Repeat this exercise with a slight bend in your right / left knee. Repeat __________ times. Complete this stretch __________ times per day.    STRENGTHENING EXERCISES - Plantar Fasciitis (Heel Spur Syndrome)  These exercises may help you when beginning to rehabilitate your injury. They may resolve your symptoms with or without further involvement from your physician, physical therapist or athletic trainer. While completing these exercises, remember:   Muscles can gain both the endurance and the strength needed for everyday activities through controlled exercises.  Complete these exercises as instructed by your physician, physical therapist or athletic trainer. Progress the resistance and repetitions only as guided. STRENGTH -  Towel Curls  Sit in a chair positioned on a non-carpeted surface.  Place your foot on a towel, keeping your heel on the floor.  Pull the towel toward your heel by only curling your toes. Keep your heel on the floor.  If instructed by your physician, physical therapist or athletic trainer, add ____________________ at the end of the towel. Repeat __________ times. Complete this exercise __________ times per day. STRENGTH - Ankle Inversion  Secure one end of a rubber exercise band/tubing to a fixed object (table, pole). Loop the other end around your foot just before your toes.  Place your fists between your knees. This will focus your strengthening at your ankle.  Slowly, pull your big toe up and in, making sure the band/tubing is positioned to resist the entire motion.  Hold this position for __________ seconds.  Have your muscles resist the band/tubing as it slowly pulls your foot back to the starting position. Repeat __________ times. Complete this exercises __________ times per day.  Document Released: 12/21/2004 Document Revised: 03/15/2011 Document Reviewed: 04/04/2008 [employer] Patient Information 2015 [employer]. This information is not intended to replace advice given to you by your health care provider. Make sure you discuss any questions you have with your health care provider.  

## 2013-12-13 NOTE — Progress Notes (Signed)
   Subjective:    Patient ID: Timothy Murray, male    DOB: 10-Nov-1945, 68 y.o.   MRN: [id number]  HPI Comments: 67 year old male presents the office they with his wife for complaints of heel pain in his right heel is his been ongoing for proximally one month. The patient denies any history of injury or trauma to the area. No recent change or increase in activity. The patient states he has pain particularly in the morning or after periods of rest. After the patient is ambulatory for short time the pain does not subside. He has been performing some stretching exercises and taking Naprosyn. However the patient does have previous x-rays and he does not want new x-rays taken at today's appointment. The patient is diabetic and he said his HbA1c is currently 7 which is up in 6.2. He denies any history of ulceration or any tingling/numbness/claudication symptoms. No other complaints at this time.  Foot Pain      Review of Systems  All other systems reviewed and are negative.      Objective:   Physical Exam AAO x3, NAD DP/PT pulses palpable bilaterally, CRT less than 3 seconds Protective sensation intact with Simms Weinstein monofilament, vibratory sensation intact, Achilles tendon reflex intact Tenderness palpation overlying the plantar medial tubercle of the calcaneus on the right heel at the insertion the plantar fascia. There is no pain on the course of the plantar fascial in the arch of the foot. There is no pain with lateral compression of the calcaneus or pain with vibratory sensation. There is no pain on the posterior aspect of the calcaneus or along the course/insertion of the Achilles tendon. There is no overlying edema, erythema, increase in warmth. There is no pain to the left lower extremity. MMT 5/5, ROM WNL No open lesions or pre-ulcerative lesions. No pain with calf compression, swelling, warmth, erythema.        Assessment & Plan:  68 year old male with right heel pain,  likely plantar fasciitis. -Patient previously had x-rays performed on the summer third 2015 which revealed a tiny plantar and Achilles calcaneal spurs with no acute findings otherwise. Patient does not want new x-rays performed today. -Treatment options were discussed including alternatives, risks, complications. -At this time patient has elected to hold off on steroid injection. -Dispense plantar fascial brace. -Discussed stretching exercises. -Ice to the area. -Discussed shoe gear modifications and orthotics to help support his foot type. -Hold off on anti-inflammatory medications due to heart disease. -Follow-up in 3 weeks or sooner if any problems are to arise. In the meantime, call the office with any questions, concerns, change in symptoms.

## 2013-12-14 ENCOUNTER — Encounter: Payer: Self-pay | Admitting: *Deleted

## 2013-12-14 NOTE — Progress Notes (Signed)
PT CAME IN OFFICE AND WAS WANTING TO RETURN PLANTAR BRACE. TOLD PT HE COULD NOT RETURN THE BRACE. HE STATED THAT THE BRACE HURT HIS HEEL. I TOLD PT TO TRY TO LOOSEN THE STRAP UP ON THE BOTTOM AND WEAR IT WHEN HE IS SITTING FOR A WHILE AND AFTER SITTING TRY TO GET UP AND DO SOME WALKING TO SEE IF THAT WOULD HELP. PT UNDERSTOOD.

## 2014-01-01 ENCOUNTER — Ambulatory Visit: Payer: Medicare HMO | Admitting: Podiatry

## 2014-01-03 ENCOUNTER — Ambulatory Visit: Payer: Medicare HMO | Admitting: Podiatry

## 2014-04-28 NOTE — H&P (Signed)
PATIENT NAME:  Timothy Murray, Timothy Murray MR#:  [id number] DATE OF BIRTH:  [birth year]  DATE OF ADMISSION:  07/02/2011  PRIMARY CARE PHYSICIAN: Pernell Dupre, MD   ER REFERRING PHYSICIAN: Lurline Hare, MD   ADMITTING PHYSICIAN:  Lodema Hong, MD   PRESENTING COMPLAINT: Chest pain.   HISTORY OF PRESENT ILLNESS: The patient is a 69 year old man who was in his usual state of health until this afternoon when he had an episode of abdominal pain soon after drinking some coffee and while eating. Pain was epigastric in location and then migrated to the substernal region and radiated up towards his neck and head. He denies any loss of consciousness. No PND, orthopnea, pedal edema. No recent long distance travel, sick contact, no trauma. He denies any rash on the chest wall. Symptoms persisted all evening long and progressively were getting worse. Initially the pain was 4 out of 10 in intensity, dull in nature, but soon became 8 out of 10 in intensity for which he presented to the Emergency Room. Here he was given nitroglycerin and aspirin and referred to the hospitalist. The patient states the pain is pleuritic in nature, worsened by deep inspiration. It is not associated with activity and no further exacerbation with food. No recent change in medication. Work-up here included an EKG which showed normal sinus, cardiac enzymes which are negative. Blood pressure on arrival was elevated at 417 systolic but now dropped to 142.    REVIEW OF SYSTEMS: CONSTITUTIONAL: Denies fever, fatigue, no weakness. No weight loss or weight gain. EYES: No blurred vision, redness or discharge. ENT: No epistaxis, difficulty swallowing or redness of oropharynx. RESPIRATORY: Admits to occasional cough and chest pain on inspiration but no hemoptysis. CARDIOVASCULAR: Positive for chest pain but no orthopnea, pedal edema, palpitations, syncopal episodes or exertional dyspnea. GI: No nausea, vomiting, diarrhea, abdominal pain. No change in bowel  habits. GU: No dysuria, frequency, or incontinence. ENDOCRINE: No polyuria, polydipsia, heat or cold intolerance. HEMATOLOGIC: No anemia, easy bruising, bleeding, or swollen glands. SKIN: No rashes, change in hair or skin texture. MUSCULOSKELETAL: No joint pain, swelling, redness, or limited activity. NEUROLOGIC: Denies any numbness, vertigo, headache. No dizziness or seizure, no memory loss. PSYCH: No anxiety, depression.  PAST MEDICAL HISTORY:  1. Hypertension.  2. Hyperlipidemia.  3. History of gastroesophageal reflux. 4. Type 2 diabetes.   PAST SURGICAL HISTORY: None.   SOCIAL HISTORY: He lives at home with his wife. He is a retired Oncologist, [employer]. He is a reformed smoker, quit more than 20 years ago after a 73 pack-year history. He has social alcohol use.   FAMILY HISTORY: Positive for coronary artery disease in the mother and diabetes in the mother.   ALLERGIES: No known drug allergies.   MEDICATIONS:  1. Simvastatin, unknown dose. 2. Metoprolol 50 mg daily. 3. Onglyza 2.5 mg daily. 4. Zocor 20 mg daily. 5. Niacin 1 tablet, 145 daily.  6. Glucosamine 1 tablet daily.  7. Omeprazole 20 mg daily.    PHYSICAL EXAMINATION:  VITAL SIGNS: Temperature 98.7, pulse 88, respiratory rate is 18, blood pressure on arrival 179/92, now is 142/72, oxygen saturation 100% on room air.   GENERAL: Elderly gentleman from the [place], lying on the gurney awake, alert, oriented to time, place, and person, in no distress. Family at bedside, supportive.   HEENT: Atraumatic, normocephalic. Pupils are equal and reactive to light and accommodation. Extraocular movements are intact. Mucous membranes are pink, moist.   NECK: Supple. No JV distention.   CHEST: Clear.  HEART: Regular rate and rhythm. No murmurs.   ABDOMEN: Full, moves with respiration, nontender. Bowel sounds are normoactive. No organomegaly.   EXTREMITIES: No edema, clubbing, deformity.   NEUROLOGICAL: No focal motor or  sensory deficits.   PSYCHIATRIC: Affect appropriate to situation.   LABORATORY, DIAGNOSTIC AND RADIOLOGICAL DATA:  EKG showed normal sinus rhythm of 84.  Chest x-ray: No obvious infiltrates.  CBC showed white count 10, hemoglobin 16, platelets 111. No prior labs for comparison.  Chemistry unremarkable except for a glucose of 191. Creatinine is 1.3, potassium 3.6, calcium 8.6.  Normal LFTs.  CK 159. Troponin negative.  Urinalysis is negative.   IMPRESSION:  1. Chest pain, rule out acute coronary syndrome but consider also gastritis.  2. Hypertension, currently elevated with likely reactive.  3. Hyperlipidemia, stable.  4. Type 2 diabetes, poorly controlled.  5. Gastroesophageal reflux disease.  6. Thrombocytopenia, not otherwise specified.   PLAN:  1. Admit to general medical floor under observation for serial cardiac enzymes, TSH, magnesium, and fasting lipid profile. Check D-dimer stat. Start the patient on Lovenox therapy. Telemonitoring, serial cardiac enzymes. If ruled out by enzymes, for stress test in the a.m.  2. GI prophylaxis with Protonix.  3. Deep vein thrombosis prophylaxis with Lovenox.  4. We will also check hemoglobin A1c for evaluation of his blood sugar control.  CODE STATUS:  FULL CODE.   TIME SPENT:   Total patient care time 50 minutes.   I will transfer to Dr. Bailey Mech service in the morning.   ____________________________ Jules Husbands. Pearletha Furl, MD mia:cbb D: 07/02/2011 05:55:48 ET T: 07/02/2011 08:27:50 ET JOB#: [id number]  cc: Mikle Sternberg I. Pearletha Furl, MD, <Dictator> Sheikh A. Elijio Miles, MD Carola Frost MD ELECTRONICALLY SIGNED 07/03/2011 0:32

## 2014-04-28 NOTE — Consult Note (Signed)
PATIENT NAME:  Timothy Murray, Timothy Murray MR#:  [id number] DATE OF BIRTH:  07/16/1945  DATE OF CONSULTATION:  07/02/2011  REFERRING PHYSICIAN:   CONSULTING PHYSICIAN:  Dionisio David, MD  HISTORY OF PRESENT ILLNESS: This is a 69 year old male with past medical history of hypertension, diabetes, hypercholesterolemia came in with pressure-type chest pain lasting for like more than an hour. He started yesterday and then last night around 10:00 p.m. he decided to come to the Emergency Room. He denies any chest pain right now.   PAST MEDICAL HISTORY:  1. Hypertension.  2. Diabetes.  3. Hypercholesterolemia.   SOCIAL HISTORY: Quit smoking 10 years ago. No history of EtOH abuse.   FAMILY HISTORY: Positive for coronary artery disease.   PHYSICAL EXAMINATION:  GENERAL: He is alert, oriented x3, in no acute distress.   VITAL SIGNS: Vitals are stable.   NECK: No JVD.   LUNGS: Clear.   HEART: Regular rate, rhythm. Normal S1, S2. No audible murmur.   ABDOMEN: Soft, nontender, positive bowel sounds.   EXTREMITIES: No pedal edema.   NEUROLOGIC: Intact.   LABORATORY, DIAGNOSTIC AND RADIOLOGICAL DATA: EKG shows normal sinus rhythm, 84 beats per minute with no acute changes.. Cardiac enzymes are negative.   ASSESSMENT AND PLAN: Chest pain, anginal type. Patient is having a stress Myoview. Will look at the pictures and decide the direction the patient will go. If seems like patient ruled out for myocardial infarction will discharge the patient. If negative stress test will follow up in the office.    Thank you very much for referral.   ____________________________ Dionisio David, MD sak:cms D: 07/02/2011 09:13:03 ET T: 07/02/2011 09:29:03 ET JOB#: [id number]  cc: Dionisio David, MD, <Dictator>  Dionisio David MD ELECTRONICALLY SIGNED 07/22/2011 13:12

## 2015-06-09 ENCOUNTER — Emergency Department: Payer: No Typology Code available for payment source

## 2015-06-09 ENCOUNTER — Emergency Department
Admission: EM | Admit: 2015-06-09 | Discharge: 2015-06-09 | Disposition: A | Discharge status: Home / Self Care | Payer: No Typology Code available for payment source | Attending: Emergency Medicine | Admitting: Emergency Medicine

## 2015-06-09 ENCOUNTER — Encounter: Payer: Self-pay | Admitting: Emergency Medicine

## 2015-06-09 DIAGNOSIS — Z91040 Latex allergy status: Secondary | ICD-10-CM | POA: Insufficient documentation

## 2015-06-09 DIAGNOSIS — Z79899 Other long term (current) drug therapy: Secondary | ICD-10-CM | POA: Diagnosis not present

## 2015-06-09 DIAGNOSIS — Z7982 Long term (current) use of aspirin: Secondary | ICD-10-CM | POA: Insufficient documentation

## 2015-06-09 DIAGNOSIS — Y92410 Unspecified street and highway as the place of occurrence of the external cause: Secondary | ICD-10-CM | POA: Diagnosis not present

## 2015-06-09 DIAGNOSIS — Z87891 Personal history of nicotine dependence: Secondary | ICD-10-CM | POA: Insufficient documentation

## 2015-06-09 DIAGNOSIS — E119 Type 2 diabetes mellitus without complications: Secondary | ICD-10-CM | POA: Insufficient documentation

## 2015-06-09 DIAGNOSIS — S0990XA Unspecified injury of head, initial encounter: Secondary | ICD-10-CM | POA: Diagnosis present

## 2015-06-09 DIAGNOSIS — S161XXA Strain of muscle, fascia and tendon at neck level, initial encounter: Secondary | ICD-10-CM | POA: Diagnosis not present

## 2015-06-09 DIAGNOSIS — M1990 Unspecified osteoarthritis, unspecified site: Secondary | ICD-10-CM | POA: Insufficient documentation

## 2015-06-09 DIAGNOSIS — E785 Hyperlipidemia, unspecified: Secondary | ICD-10-CM | POA: Diagnosis not present

## 2015-06-09 DIAGNOSIS — I10 Essential (primary) hypertension: Secondary | ICD-10-CM | POA: Diagnosis not present

## 2015-06-09 DIAGNOSIS — Y999 Unspecified external cause status: Secondary | ICD-10-CM | POA: Diagnosis not present

## 2015-06-09 DIAGNOSIS — Z951 Presence of aortocoronary bypass graft: Secondary | ICD-10-CM | POA: Diagnosis not present

## 2015-06-09 DIAGNOSIS — Y9389 Activity, other specified: Secondary | ICD-10-CM | POA: Insufficient documentation

## 2015-06-09 DIAGNOSIS — I2510 Atherosclerotic heart disease of native coronary artery without angina pectoris: Secondary | ICD-10-CM | POA: Diagnosis not present
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
## 2015-06-09 MED ORDER — BACLOFEN 10 MG PO TABS: 10.0000 mg | ORAL_TABLET | Freq: Three times a day (TID) | ORAL | Status: DC

## 2015-06-09 MED ORDER — IBUPROFEN 600 MG PO TABS: 600.0000 mg | ORAL_TABLET | Freq: Four times a day (QID) | ORAL | Status: DC | PRN
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
## 2015-06-09 NOTE — ED Notes (Signed)
Pt. Verbalizes understanding of d/c instructions, prescriptions, and follow-up care. VS stable. Pt. Ambulatory out of the unit with steady gait in NAD at time of d/c.
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
## 2016-01-02 DIAGNOSIS — R0602 Shortness of breath: Secondary | ICD-10-CM | POA: Insufficient documentation

## 2016-01-08 DIAGNOSIS — M1990 Unspecified osteoarthritis, unspecified site: Secondary | ICD-10-CM | POA: Insufficient documentation

## 2016-01-16 DIAGNOSIS — M7070 Other bursitis of hip, unspecified hip: Secondary | ICD-10-CM | POA: Insufficient documentation

## 2016-01-16 DIAGNOSIS — M5136 Other intervertebral disc degeneration, lumbar region: Secondary | ICD-10-CM | POA: Insufficient documentation

## 2016-06-03 DIAGNOSIS — L209 Atopic dermatitis, unspecified: Secondary | ICD-10-CM | POA: Insufficient documentation

## 2016-06-03 DIAGNOSIS — J309 Allergic rhinitis, unspecified: Secondary | ICD-10-CM | POA: Insufficient documentation

## 2016-12-07 ENCOUNTER — Encounter (INDEPENDENT_AMBULATORY_CARE_PROVIDER_SITE_OTHER): Payer: Self-pay

## 2016-12-07 ENCOUNTER — Other Ambulatory Visit: Payer: Self-pay

## 2016-12-07 ENCOUNTER — Other Ambulatory Visit: Payer: Self-pay | Admitting: Hematology and Oncology

## 2016-12-07 ENCOUNTER — Inpatient Hospital Stay: Payer: Medicare HMO | Attending: Hematology and Oncology | Admitting: Hematology and Oncology

## 2016-12-07 ENCOUNTER — Encounter: Payer: Self-pay | Admitting: Hematology and Oncology

## 2016-12-07 ENCOUNTER — Inpatient Hospital Stay: Payer: Medicare HMO

## 2016-12-07 VITALS — BP 122/74 | HR 67 | Temp 97.9°F | Resp 20 | Ht 72.0 in | Wt 216.1 lb

## 2016-12-07 DIAGNOSIS — Z951 Presence of aortocoronary bypass graft: Secondary | ICD-10-CM | POA: Diagnosis not present

## 2016-12-07 DIAGNOSIS — M1990 Unspecified osteoarthritis, unspecified site: Secondary | ICD-10-CM | POA: Diagnosis not present

## 2016-12-07 DIAGNOSIS — Z79899 Other long term (current) drug therapy: Secondary | ICD-10-CM | POA: Insufficient documentation

## 2016-12-07 DIAGNOSIS — Z7984 Long term (current) use of oral hypoglycemic drugs: Secondary | ICD-10-CM | POA: Insufficient documentation

## 2016-12-07 DIAGNOSIS — Z7982 Long term (current) use of aspirin: Secondary | ICD-10-CM | POA: Diagnosis not present

## 2016-12-07 DIAGNOSIS — D696 Thrombocytopenia, unspecified: Secondary | ICD-10-CM | POA: Insufficient documentation

## 2016-12-07 DIAGNOSIS — Z87891 Personal history of nicotine dependence: Secondary | ICD-10-CM | POA: Insufficient documentation

## 2016-12-07 DIAGNOSIS — I10 Essential (primary) hypertension: Secondary | ICD-10-CM | POA: Insufficient documentation

## 2016-12-07 DIAGNOSIS — K219 Gastro-esophageal reflux disease without esophagitis: Secondary | ICD-10-CM | POA: Insufficient documentation

## 2016-12-07 DIAGNOSIS — I2510 Atherosclerotic heart disease of native coronary artery without angina pectoris: Secondary | ICD-10-CM | POA: Insufficient documentation

## 2016-12-07 DIAGNOSIS — E119 Type 2 diabetes mellitus without complications: Secondary | ICD-10-CM | POA: Insufficient documentation

## 2016-12-07 DIAGNOSIS — E7800 Pure hypercholesterolemia, unspecified: Secondary | ICD-10-CM | POA: Insufficient documentation

## 2016-12-07 LAB — CBC WITH DIFFERENTIAL/PLATELET
Basophils Absolute: 0 10*3/uL (ref 0–0.1)
Basophils Relative: 1 %
Eosinophils Absolute: 0.1 10*3/uL (ref 0–0.7)
Eosinophils Relative: 2 %
HCT: 45.1 % (ref 40.0–52.0)
Hemoglobin: 15.7 g/dL (ref 13.0–18.0)
Lymphocytes Relative: 34 %
Lymphs Abs: 2.1 10*3/uL (ref 1.0–3.6)
MCH: 33.5 pg (ref 26.0–34.0)
MCHC: 34.7 g/dL (ref 32.0–36.0)
MCV: 96.6 fL (ref 80.0–100.0)
Monocytes Absolute: 0.5 10*3/uL (ref 0.2–1.0)
Monocytes Relative: 7 %
Neutro Abs: 3.5 10*3/uL (ref 1.4–6.5)
Neutrophils Relative %: 56 %
Platelets: 114 10*3/uL — ABNORMAL LOW (ref 150–440)
RBC: 4.67 MIL/uL (ref 4.40–5.90)
RDW: 13.8 % (ref 11.5–14.5)
WBC: 6.2 10*3/uL (ref 3.8–10.6)

## 2016-12-07 LAB — FOLATE: Folate: 33 ng/mL (ref >5.9)

## 2016-12-07 LAB — PROTIME-INR
INR: 1.02
Prothrombin Time: 13.3 seconds (ref 11.4–15.2)

## 2016-12-07 LAB — VITAMIN B12: Vitamin B-12: 557 pg/mL (ref 180–914)

## 2016-12-07 LAB — APTT: aPTT: 31 seconds (ref 24–36)

## 2016-12-07 LAB — TECHNOLOGIST SMEAR REVIEW: Tech Review: DECREASED

## 2016-12-07 NOTE — Progress Notes (Signed)
[facility] day:  12/07/2016  Chief Complaint: Timothy Murray is a 71 y.o. male with thrombocytopenia who is referred in consultation by Evern Bio, NP for assessment and management.  HPI: The patient has been aware of a low platelet count for approximately 6 months.  He states that he gets regular lab draws.  He denies any excess bruising or bleeding.  He denies any new medications or herbal products.  He feels well.  He denies any B symptoms.  He notes a history of hepatitis in 2003.  He has a history of GERD.  He denies any history of H pylori.  He denies any history of any autoimmune disease.  He notes some arthritis.  CBC has been followed: 07/01/2011:  Hematocrit 47.5, hemoglobin 16.5, MCV 94, platelets 111,000, white count 10,300. 10/24/2012:  Hematocrit 43.8, hemoglobin 15.6, MCV 94, platelets 165,000, white count 7,200. 10/29/2012:  Hematocrit 29.0, hemoglobin 10.4, MCV 92.4, platelets 126,000, white count 15,000. 01/07/2016:  Hematocrit 48.3, hemoglobin 16.7, MCV 94, platelets 110,000, white count 5,700. 05/28/2016:  Hematocrit 45.7, hemoglobin 15.3, MCV 98, platelets 100,000, white count 6,200 with an ANC of 3700. Differential was unremarkable. 09/28/2016:  Hematocrit 43.2, hemoglobin 14.8, MCV 96, platelets 107,000, white count 7,800. 09/30/2016:  Hematocrit 44.3, hemoglobin 15.6, MCV 96, platelets 94,000, white count 5,900. 11/23/2016:  Hematocrit 45.0, hemoglobin 15.5, MCV 97, platelets 111,000, white count 6,100 with an ANC of 3200.  Differential included 53% sites, 40% lymphs, 4% monocytes, 2% eosinophils and 1% basophils.  Comprehensive metabolic panel on 22/02/5425 included a creatinine of 1.23.  Calcium, protein, albumin, and liver function tests were normal.  Comprehensive metabolic panel on 06/27/7626 revealed a creatinine of 1.12.  Calcium, protein, and albumin were normal. SGPT was slightly elevated at 51. Comprehensive   metabolic panel on 31/51/7616 revealed a creatinine of 0.99. SGPT was 50.  TSH was 1.05 on 10/01/2016.  He was scheduled for an endoscopy in September, however [place] was [place]. He maintains a standard "Filipino" diet consisting mainly of rice and vegetables. He does eat pork and fish.   He plans to move back to [place] in January.     Past Medical History:  Diagnosis Date  . Arthritis    "lower back" (10/24/2012)  . Coronary artery disease   . GERD (gastroesophageal reflux disease)   . High cholesterol   . Hypertension   . Type II diabetes mellitus ([place])     Past Surgical History:  Procedure Laterality Date  . APPENDECTOMY  2002  . CARDIAC CATHETERIZATION  10/24/2012  . CORONARY ARTERY BYPASS GRAFT N/A 10/26/2012   Procedure: CORONARY ARTERY BYPASS GRAFTING (CABG);  Surgeon: Melrose Nakayama, MD;  Location: [facility];  Service: Open Heart Surgery;  Laterality: N/A;  Times 3 using left internal mammary artery and endoscopically harvested right saphenous vein    Family History  Problem Relation Age of Onset  . Cancer Sister     Social History:  reports that he quit smoking about 15 years ago. His smoking use included cigarettes. He has a 15.00 pack-year smoking history. he has never used smokeless tobacco. He reports that he does not drink alcohol or use drugs.  He quit smoking in 2003.  He is retired from [employer]. Patient is originally from the [place]. He moved from [place] to [place] 5-6 years ago (2012-2013).  He plans to move back to [place] between the end of December 2018 and the second week  in January 2019.  He travels to the [place] regularly.  The patient is accompanied by his wife today.  Allergies:  Allergies  Allergen Reactions  . Soap & Cleansers     "liquid soap" antibacterial -rash  . Latex Rash    Current Medications: Current Outpatient Medications  Medication Sig Dispense Refill  . allopurinol (ZYLOPRIM) 100 MG tablet Take 100 mg by  mouth daily.  3  . aspirin EC 325 MG EC tablet Take 1 tablet (325 mg total) by mouth daily. 30 tablet 0  . atorvastatin (LIPITOR) 20 MG tablet Take 1 tablet (20 mg total) by mouth daily at 6 PM. 30 tablet 1  . glimepiride (AMARYL) 1 MG tablet Take 1 mg by mouth daily.    Marland Kitchen glucosamine-chondroitin 500-400 MG tablet Take 1 tablet by mouth daily.    Marland Kitchen losartan (COZAAR) 25 MG tablet Take 25 mg by mouth daily.    . metoprolol tartrate (LOPRESSOR) 25 MG tablet Take 25 mg by mouth daily.    . Multiple Vitamins-Minerals (MEGA MULTIVITAMIN FOR MEN) TABS Take 1 tablet by mouth daily.    . niacin 500 MG CR capsule Take 1 capsule (500 mg total) by mouth at bedtime. 30 capsule 1  . baclofen (LIORESAL) 10 MG tablet Take 1 tablet (10 mg total) by mouth 3 (three) times daily. (Patient not taking: Reported on 12/07/2016) 30 tablet 0  . fluocinonide cream (LIDEX) 0.05 %     . furosemide (LASIX) 40 MG tablet Take 40 mg by mouth daily.    Marland Kitchen ibuprofen (ADVIL,MOTRIN) 600 MG tablet Take 1 tablet (600 mg total) by mouth every 6 (six) hours as needed. (Patient not taking: Reported on 12/07/2016) 30 tablet 0  . omeprazole (PRILOSEC) 20 MG capsule Take 20 mg by mouth every morning.    . potassium chloride SA (K-DUR,KLOR-CON) 20 MEQ tablet Take 20 mEq by mouth daily.    . predniSONE (STERAPRED UNI-PAK) 10 MG tablet Take 5 tablets on day 1, 4 tablets on day 2, 3 tablets on day 3, 2 tablets on day 4, 1 tablet on day 5, then stop (Patient not taking: Reported on 12/07/2016) 15 tablet 0  . saxagliptin HCl (ONGLYZA) 2.5 MG TABS tablet Take 2.5 mg by mouth daily.    Marland Kitchen zolpidem (AMBIEN) 10 MG tablet Take 10 mg by mouth at bedtime as needed for sleep.     No current facility-administered medications for this visit.     Review of Systems:  GENERAL:  Feels good.  Active.  No fevers, sweats or weight loss. PERFORMANCE STATUS (ECOG):  0 HEENT:  Runny nose.  Blurred vision in right eye (sees ophthalmology).  Wears glasses.  No sore  throat, mouth sores or tenderness. Lungs: No shortness of breath or cough.  No hemoptysis. Cardiac:  No chest pain, palpitations, orthopnea, or PND. GI:  Reflux.  No nausea, vomiting, diarrhea, constipation, melena or hematochezia. GU:  Nocturia.  No urgency, frequency, dysuria, or hematuria. Musculoskeletal:  No back pain.  No joint pain.  No muscle tenderness. Extremities:  No pain or swelling. Skin:  No rashes or skin changes. Neuro:  Intermittent non-specific headaches.  No numbness or weakness, balance or coordination issues. Endocrine:  No diabetes, thyroid issues, hot flashes or night sweats. Psych:  No mood changes, depression or anxiety. Pain:  No focal pain. Review of systems:  All other systems reviewed and found to be negative.  Physical Exam: Blood pressure 122/74, pulse 67, temperature 97.9 F (36.6 C), resp.  rate 20, height 6' (1.829 m), weight 216 lb 1 oz (98 kg). GENERAL:  Well developed, well nourished, gentleman sitting comfortably in the exam room in no acute distress. MENTAL STATUS:  Alert and oriented to person, place and time. HEAD:  Short black hair.  Normocephalic, atraumatic, face symmetric, no Cushingoid features. EYES:  Brown eyes.  Pupils equal round and reactive to light and accomodation.  No conjunctivitis or scleral icterus. ENT:  Oropharynx clear without lesion.  Tongue normal.  Partial metal plate.  Mucous membranes moist.  RESPIRATORY:  Clear to auscultation without rales, wheezes or rhonchi. CARDIOVASCULAR:  Regular rate and rhythm without murmur, rub or gallop. ABDOMEN:  Soft, non-tender, with active bowel sounds, and no hepatosplenomegaly.  No masses. SKIN:  No rashes, ulcers or lesions. EXTREMITIES: No edema, no skin discoloration or tenderness.  No palpable cords. LYMPH NODES: No palpable cervical, supraclavicular, axillary or inguinal adenopathy  NEUROLOGICAL: Unremarkable. PSYCH:  Appropriate.   No visits with results within 3 Day(s) from this  visit.  Latest known visit with results is:  Admission on 10/24/2012, Discharged on 10/30/2012  No results displayed because visit has over 200 results.      Assessment:  Timothy Murray is a 71 y.o. male with mild thrombocytopenia.  Platele count has ranged between 94,000 - 126,000 since 06/2011.  He denies any new medications or herbal products.  He has a history of hepatitis in 2003.  He has a history of reflux.  Symptomatically, he feels well.  He denies any B symptoms.  Exam reveals no adenopathy or hepatosplenomegaly.  Plan: 1.  Discuss diagnosis of thrombocytopenia.  Discuss differential (pseudothrombocytopenia, medications, herbal products, infections, vitamin deficiencies, autoimmune disorders, ITP, and marrow replacement disorders).  Given the longstanding mildly low platelet count, suspect chronic ITP (diagnosis of exclusion).  Discuss laboratory work-up. 2.  Labs:  CBC with diff, platelet count in a blue top tube, PT, PTT, ANA with reflex, B12, folate, myeloma panel, free light chains, HIV antibody, hepatitis B core antibody, hepatitis C antibody, and H.pylori antibody.  Patient verbally consents to HIV and hepatitis testing.  3.  RTC in 1 week for MD assessment and to review workup.    Honor Loh, NP 12/07/2016,11:47 AM    I saw and evaluated the patient, participating in the key portions of the service and reviewing pertinent diagnostic studies and records.  I reviewed the nurse practitioner's note and agree with the findings and the plan.  The assessment and plan were discussed with the patient.  Several questions were asked by the patient and answered.   Nolon Stalls, MD 12/07/2016,11:47 AM

## 2016-12-07 NOTE — Progress Notes (Signed)
Patient here today as new evaluation regarding thrombocytopenia.  Referred by [facility] @ [facility].  Patient offers no complaints today.

## 2016-12-08 LAB — KAPPA/LAMBDA LIGHT CHAINS
Kappa free light chain: 19 mg/L (ref 3.3–19.4)
Kappa, lambda light chain ratio: 1.23 (ref 0.26–1.65)
Lambda free light chains: 15.5 mg/L (ref 5.7–26.3)

## 2016-12-08 LAB — ANA W/REFLEX: Anti Nuclear Antibody(ANA): NEGATIVE

## 2016-12-08 LAB — H. PYLORI ANTIBODY, IGG

## 2016-12-08 LAB — HIV ANTIBODY (ROUTINE TESTING W REFLEX): HIV Screen 4th Generation wRfx: NONREACTIVE

## 2016-12-08 LAB — HEPATITIS C ANTIBODY: HCV Ab: 0.4 s/co ratio (ref 0.0–0.9)

## 2016-12-08 LAB — HEPATITIS B CORE ANTIBODY, TOTAL: Hep B Core Total Ab: NEGATIVE

## 2016-12-09 LAB — MULTIPLE MYELOMA PANEL, SERUM
Albumin SerPl Elph-Mcnc: 3.9 g/dL (ref 2.9–4.4)
Albumin/Glob SerPl: 1.5 (ref 0.7–1.7)
Alpha 1: 0.2 g/dL (ref 0.0–0.4)
Alpha2 Glob SerPl Elph-Mcnc: 0.6 g/dL (ref 0.4–1.0)
B-Globulin SerPl Elph-Mcnc: 0.9 g/dL (ref 0.7–1.3)
Gamma Glob SerPl Elph-Mcnc: 1 g/dL (ref 0.4–1.8)
Globulin, Total: 2.7 g/dL (ref 2.2–3.9)
IgA: 147 mg/dL (ref 61–437)
IgG (Immunoglobin G), Serum: 1005 mg/dL (ref 700–1600)
IgM (Immunoglobulin M), Srm: 58 mg/dL (ref 20–172)
Total Protein ELP: 6.6 g/dL (ref 6.0–8.5)

## 2016-12-13 ENCOUNTER — Inpatient Hospital Stay: Payer: Medicare HMO | Admitting: Hematology and Oncology

## 2016-12-14 ENCOUNTER — Ambulatory Visit: Payer: Medicare HMO | Admitting: Hematology and Oncology

## 2016-12-16 ENCOUNTER — Other Ambulatory Visit: Payer: Self-pay

## 2016-12-16 ENCOUNTER — Inpatient Hospital Stay (HOSPITAL_BASED_OUTPATIENT_CLINIC_OR_DEPARTMENT_OTHER): Payer: Medicare HMO | Admitting: Hematology and Oncology

## 2016-12-16 VITALS — BP 124/73 | HR 65 | Temp 94.2°F | Resp 18 | Wt 218.9 lb

## 2016-12-16 DIAGNOSIS — D696 Thrombocytopenia, unspecified: Secondary | ICD-10-CM

## 2016-12-16 NOTE — Progress Notes (Signed)
Here for follow up. Stated feeling well  

## 2016-12-16 NOTE — Patient Instructions (Signed)
Idiopathic Thrombocytopenic Purpura Idiopathic thrombocytopenic purpura (ITP) is a disease in which your body's defense system (immune system) attacks your platelets. Platelets are blood cells that help form clots and seal leaks in damaged blood vessels. With ITP, you to have too few platelets. As a result, you bleed more easily. It is also harder for your body to stop any bleeding. In adults, ITP is usually a long-term disease. What are the causes? The cause is unknown. ITP may develop after a viral infection, during pregnancy, or from an immune disorder. What increases the risk? The risk of ITP may be greater among:  Women.  Adults 21-39 years old.  What are the signs or symptoms? Common signs and symptoms include:  Easy bruising.  A cut that bleeds for a long time.  Tiny purple blood dots (petechiae) under the skin, especially on the shins.  Blood in urine or bowel movements.  Nosebleeds.  Bleeding gums.  Heavy menstrual periods.  Mild forms of ITP may not cause any symptoms. How is this diagnosed? Your health care provider may suspect ITP based on your signs and symptoms. To make a diagnosis, your health care provider may do a physical exam and order blood tests to:  Find how many platelets you have.  See how well your blood clots.  Your health care provider may then do blood tests or a bone marrow test to rule out other conditions that could be causing your symptoms. How is this treated? Treatment depends on the severity of your condition. Options include:  Monitoring of your condition and platelet count.  Blood transfusions of antibodies or platelets.  Medicines such as: ? Strong anti-inflammatory medicines (steroids). ? Medicines to boost platelet production. ? Medicines to suppress your immune system.  Removal of your spleen. This may be done if other treatments do not work.  Follow these instructions at home:  Take medicines only as directed by your  health care provider.  Do not take over-the-counter medicines that lower your platelet count, affect platelet function, or affect your blood's ability to clot. These include aspirin and ibuprofen.  Do not participate in contact sports or other high-risk activities. Ask your health care provider which activities are safe for you.  Keep all follow-up visits as directed by your health care provider. This is important. Contact a health care provider if:  You have new symptoms.  Your symptoms get worse. Get help right away if:  You have a sudden severe headache or confusion.  You have significant bleeding.  You have nausea and vomiting. This information is not intended to replace advice given to you by your health care provider. Make sure you discuss any questions you have with your health care provider. Document Released: 07/18/2013 Document Revised: 05/29/2015 Document Reviewed: 05/10/2013 Elsevier Interactive Patient Education  [employer].

## 2016-12-16 NOTE — Progress Notes (Signed)
[facility] day:  12/16/2016  Chief Complaint: Timothy Murray is a 71 y.o. male with thrombocytopenia who is seen for review of work-up and discussion regarding direction of therapy.  HPI: The patient was last seen in the hematology clinic on 12/07/2016 for initial consultation.  He had a history of mild thrombocytopenia since 06/2011.  Platele count ranged between 94,000 - 126,000.  He denied any new medications or herbal products.  He gave a history of hepatitis and reflux.  He denied any symptoms.   He underwent a work-up.  CBC revealed a hematocrit 45.1, hemoglobin 15.7, MCV 96.6, platelets 114,000, WBC 6200 with an ANC of 3500.  Differential was unremarkable.  Peripheral smear revealed platelets of various sizes, some large body platelets.  PT was 13.3 (INR 1.02).  PTT was 31.  SPEP revealed no monoclonal protein.  Kappa free light chains were 19, lambda free light chains 15.5, and ratio 1.23 (normal).  H. pylori IgG was < 0.80.  Hepatitis B core antibody and C antibody was negative.  HIV testing was negative.  ANA was negative.  B12 was 557.  Folate was 33.  During the interim, patient notes no acute changes. He has no physical complaints today. There has been no areas of unexplained bruising or bleeding. He denies recent illness.    Past Medical History:  Diagnosis Date  . Arthritis    "lower back" (10/24/2012)  . Coronary artery disease   . GERD (gastroesophageal reflux disease)   . High cholesterol   . Hypertension   . Type II diabetes mellitus (Rafael Gonzalez)     Past Surgical History:  Procedure Laterality Date  . APPENDECTOMY  2002  . CARDIAC CATHETERIZATION  10/24/2012  . CORONARY ARTERY BYPASS GRAFT N/A 10/26/2012   Procedure: CORONARY ARTERY BYPASS GRAFTING (CABG);  Surgeon: Melrose Nakayama, MD;  Location: [facility];  Service: Open Heart Surgery;  Laterality: N/A;  Times 3 using left internal mammary artery and endoscopically harvested  right saphenous vein    Family History  Problem Relation Age of Onset  . Cancer Sister     Social History:  reports that he quit smoking about 15 years ago. His smoking use included cigarettes. He has a 15.00 pack-year smoking history. he has never used smokeless tobacco. He reports that he does not drink alcohol or use drugs.  He quit smoking in 2003.  He is retired from [employer]. Patient is originally from the [place]. He moved from [place] to [place] 5-6 years ago (2012-2013).  He plans to move back to [place] between the end of December 2018 and the second week in January 2019.  He travels to the [place] regularly.  The patient is accompanied by his wife today.  Allergies:  Allergies  Allergen Reactions  . Soap & Cleansers     "liquid soap" antibacterial -rash  . Latex Rash    Current Medications: Current Outpatient Medications  Medication Sig Dispense Refill  . allopurinol (ZYLOPRIM) 100 MG tablet Take 100 mg by mouth daily.  3  . aspirin EC 81 MG tablet Take 81 mg by mouth daily.    Marland Kitchen atorvastatin (LIPITOR) 20 MG tablet Take 1 tablet (20 mg total) by mouth daily at 6 PM. 30 tablet 1  . fluocinonide cream (LIDEX) 0.05 %     . glimepiride (AMARYL) 1 MG tablet Take 1 mg by mouth daily.    Marland Kitchen glucosamine-chondroitin 500-400 MG tablet Take 1 tablet by mouth  daily.    . losartan (COZAAR) 25 MG tablet Take 25 mg by mouth daily.    . metoprolol tartrate (LOPRESSOR) 25 MG tablet Take 25 mg by mouth daily.    . Multiple Vitamins-Minerals (MEGA MULTIVITAMIN FOR MEN) TABS Take 1 tablet by mouth daily.    . niacin 500 MG CR capsule Take 1 capsule (500 mg total) by mouth at bedtime. 30 capsule 1  . omeprazole (PRILOSEC) 20 MG capsule Take 20 mg by mouth every morning.    . furosemide (LASIX) 40 MG tablet Take 40 mg by mouth daily.    Marland Kitchen ibuprofen (ADVIL,MOTRIN) 600 MG tablet Take 1 tablet (600 mg total) by mouth every 6 (six) hours as needed. (Patient not taking: Reported on  12/07/2016) 30 tablet 0  . potassium chloride SA (K-DUR,KLOR-CON) 20 MEQ tablet Take 20 mEq by mouth daily.    . saxagliptin HCl (ONGLYZA) 2.5 MG TABS tablet Take 2.5 mg by mouth daily.    Marland Kitchen zolpidem (AMBIEN) 10 MG tablet Take 10 mg by mouth at bedtime as needed for sleep.     No current facility-administered medications for this visit.     Review of Systems:  GENERAL:  Feels good.  Active.  No fevers or sweats. Weight up 2 pounds.  PERFORMANCE STATUS (ECOG):  0 HEENT:  Runny nose.  Blurred vision in right eye (sees ophthalmology).  Wears glasses.  No sore throat, mouth sores or tenderness. Lungs: No shortness of breath or cough.  No hemoptysis. Cardiac:  No chest pain, palpitations, orthopnea, or PND. GI:  Reflux.  No nausea, vomiting, diarrhea, constipation, melena or hematochezia. GU:  Nocturia.  No urgency, frequency, dysuria, or hematuria. Musculoskeletal:  No back pain.  No joint pain.  No muscle tenderness. Extremities:  No pain or swelling. Skin:  No rashes or skin changes. Neuro:  Intermittent non-specific headaches.  No numbness or weakness, balance or coordination issues. Endocrine:  No diabetes, thyroid issues, hot flashes or night sweats. Psych:  No mood changes, depression or anxiety. Pain:  No focal pain. Review of systems:  All other systems reviewed and found to be negative.  Physical Exam: Blood pressure 124/73, pulse 65, temperature (!) 94.2 F (34.6 C), temperature source Tympanic, resp. rate 18, weight 218 lb 14.4 oz (99.3 kg). GENERAL:  Well developed, well nourished, gentleman sitting comfortably in the exam room in no acute distress. MENTAL STATUS:  Alert and oriented to person, place and time. HEAD:  Short black hair.  Normocephalic, atraumatic, face symmetric, no Cushingoid features. EYES:  Brown eyes.  No conjunctivitis or scleral icterus.  NEUROLOGICAL: Unremarkable. PSYCH:  Appropriate.   No visits with results within 3 Day(s) from this visit.  Latest  known visit with results is:  Office Visit on 12/07/2016  Component Date Value Ref Range Status  . Tech Review 12/07/2016 PLATELETS APPEAR DECREASED   Final   PLATELETS VARY IN SIZE.  LARGE BODY PLATELETS NOTED ON SMEAR  . Kappa free light chain 12/07/2016 19.0  3.3 - 19.4 mg/L Final  . Lamda free light chains 12/07/2016 15.5  5.7 - 26.3 mg/L Final  . Kappa, lamda light chain ratio 12/07/2016 1.23  0.26 - 1.65 Final   Comment: (NOTE) Performed At: [facility] [place], [place] [id number] Rush Farmer MD JO:[id number]   . H Pylori IgG 12/07/2016 <0.80  0.00 - 0.79 Index Value Final   Comment: (NOTE)  Negative           <0.80                             Equivocal    0.80 - 0.89                             Positive           >0.89 Performed At: [facility] [place], [place] [id number] Rush Farmer MD IR:[id number]   . IgG (Immunoglobin G), Serum 12/07/2016 1,005  700 - 1,600 mg/dL Final  . IgA 12/07/2016 147  61 - 437 mg/dL Final  . IgM (Immunoglobulin M), Srm 12/07/2016 58  20 - 172 mg/dL Final  . Total Protein ELP 12/07/2016 6.6  6.0 - 8.5 g/dL Corrected  . Albumin SerPl Elph-Mcnc 12/07/2016 3.9  2.9 - 4.4 g/dL Corrected  . Alpha 1 12/07/2016 0.2  0.0 - 0.4 g/dL Corrected  . Alpha2 Glob SerPl Elph-Mcnc 12/07/2016 0.6  0.4 - 1.0 g/dL Corrected  . B-Globulin SerPl Elph-Mcnc 12/07/2016 0.9  0.7 - 1.3 g/dL Corrected  . Gamma Glob SerPl Elph-Mcnc 12/07/2016 1.0  0.4 - 1.8 g/dL Corrected  . M Protein SerPl Elph-Mcnc 12/07/2016 Not Observed  Not Observed g/dL Corrected  . Globulin, Total 12/07/2016 2.7  2.2 - 3.9 g/dL Corrected  . Albumin/Glob SerPl 12/07/2016 1.5  0.7 - 1.7 Corrected  . IFE 1 12/07/2016 Comment   Corrected   An apparent normal immunofixation pattern.  . Please Note 12/07/2016 Comment   Corrected   Comment: (NOTE) Protein electrophoresis scan will follow via computer, mail, or courier  delivery. Performed At: [facility] [place], [place] [id number] Rush Farmer MD AT:[id number]   . HCV Ab 12/07/2016 0.4  0.0 - 0.9 s/co ratio Final   Comment: (NOTE)                                  Negative:     < 0.8                             Indeterminate: 0.8 - 0.9                                  Positive:     > 0.9 The CDC recommends that a positive HCV antibody result be followed up with a HCV Nucleic Acid Amplification test (270623). Performed At: [facility] [place], [place] [id number] Rush Farmer MD OH:[id number]   . Hep B Core Total Ab 12/07/2016 Negative  Negative Final   Comment: (NOTE) Performed At: [facility] [place], [place] [id number] Rush Farmer MD JJ:[id number]   . HIV Screen 4th Generation wRfx 12/07/2016 Non Reactive  Non Reactive Final   Comment: (NOTE) Performed At: [facility] [address] [place], [place] [id number] Rush Farmer MD FY:[id number]   . Anit Nuclear Antibody(ANA) 12/07/2016 Negative  Negative Final   Comment: (NOTE) Performed At: [facility] [place], [place] [id number] Rush Farmer MD NT:[id number]   . Folate 12/07/2016 33.0  >5.9 ng/mL Final  . Vitamin B-12 12/07/2016 557  180 - 914 pg/mL Final   Comment: (  NOTE) This assay is not validated for testing neonatal or myeloproliferative syndrome specimens for Vitamin B12 levels. Performed at [facility], [place] [address] [place], [place] [address]   . aPTT 12/07/2016 31  24 - 36 seconds Final  . Prothrombin Time 12/07/2016 13.3  11.4 - 15.2 seconds Final  . INR 12/07/2016 1.02   Final  . WBC 12/07/2016 6.2  3.8 - 10.6 K/uL Final  . RBC 12/07/2016 4.67  4.40 - 5.90 MIL/uL Final  . Hemoglobin 12/07/2016 15.7  13.0 - 18.0 g/dL Final  . HCT 12/07/2016 45.1  40.0 - 52.0 % Final  . MCV 12/07/2016 96.6  80.0 - 100.0 fL Final  . MCH 12/07/2016 33.5  26.0 - 34.0 pg  Final  . MCHC 12/07/2016 34.7  32.0 - 36.0 g/dL Final  . RDW 12/07/2016 13.8  11.5 - 14.5 % Final  . Platelets 12/07/2016 114* 150 - 440 K/uL Final  . Neutrophils Relative % 12/07/2016 56  % Final  . Neutro Abs 12/07/2016 3.5  1.4 - 6.5 K/uL Final  . Lymphocytes Relative 12/07/2016 34  % Final  . Lymphs Abs 12/07/2016 2.1  1.0 - 3.6 K/uL Final  . Monocytes Relative 12/07/2016 7  % Final  . Monocytes Absolute 12/07/2016 0.5  0.2 - 1.0 K/uL Final  . Eosinophils Relative 12/07/2016 2  % Final  . Eosinophils Absolute 12/07/2016 0.1  0 - 0.7 K/uL Final  . Basophils Relative 12/07/2016 1  % Final  . Basophils Absolute 12/07/2016 0.0  0 - 0.1 K/uL Final    Assessment:  Bentlee Benningfield is a 71 y.o. male with mild thrombocytopenia.  Platele count has ranged between 94,000 - 126,000 since 06/2011.  He denies any new medications or herbal products.  Work-up on 12/07/2016 revealed a hematocrit 45.1, hemoglobin 15.7, MCV 96.6, platelets 114,000, WBC 6200 with an ANC of 3500.  Differential was unremarkable.  Peripheral smear revealed platelets of various sizes, some large body platelets.  Normal studies included: PT, PTT, SPEP, free light chains, H pylori antibody, hepatitis B core antibody, hepatitis C antibody, HIV testing, ANA, B12, and folate.  Symptomatically, he feels well.  He denies any B symptoms.  Exam reveals no adenopathy or hepatosplenomegaly.  Plan: 1.  Discuss work-up.  Laboratory assessment is negative.  Peripheral smear reveals large platelets.   2.  Discuss suspected chronic immune mediated thrombocytopenic purpura (ITP).  Discuss no indications for treatment. 3.  RTC prn.     Honor Loh, NP 12/16/2016, 3:56 PM   I saw and evaluated the patient, participating in the key portions of the service and reviewing pertinent diagnostic studies and records.  I reviewed the nurse practitioner's note and agree with the findings and the plan.  The assessment and plan were discussed with the  patient.  Several questions were asked by the patient and answered.   Nolon Stalls, MD 12/16/2016,3:56 PM

## 2016-12-19 ENCOUNTER — Encounter: Payer: Self-pay | Admitting: Hematology and Oncology

## 2017-02-07 ENCOUNTER — Inpatient Hospital Stay
Admit: 2017-02-07 | Discharge: 2017-02-11 | Disposition: A | Discharge status: Home Health | Admitting: Thoracic Surgery (Cardiothoracic Vascular Surgery)

## 2017-02-07 ENCOUNTER — Emergency Department: Admit: 2017-02-07 | Discharge: 2017-02-11

## 2017-02-07 ENCOUNTER — Inpatient Hospital Stay
Admit: 2017-02-07 | Discharge: 2017-02-11 | Disposition: A | Discharge status: Home Health | Attending: Anesthesiology | Admitting: Thoracic Surgery (Cardiothoracic Vascular Surgery)

## 2017-02-07 DIAGNOSIS — I10 Essential (primary) hypertension: Secondary | ICD-10-CM

## 2017-02-07 DIAGNOSIS — Z951 Presence of aortocoronary bypass graft: Secondary | ICD-10-CM

## 2017-02-07 DIAGNOSIS — Z7982 Long term (current) use of aspirin: Secondary | ICD-10-CM

## 2017-02-07 DIAGNOSIS — Z79899 Other long term (current) drug therapy: Secondary | ICD-10-CM

## 2017-02-07 DIAGNOSIS — Z87891 Personal history of nicotine dependence: Secondary | ICD-10-CM

## 2017-02-07 DIAGNOSIS — E119 Type 2 diabetes mellitus without complications: Secondary | ICD-10-CM

## 2017-02-07 DIAGNOSIS — Z7984 Long term (current) use of oral hypoglycemic drugs: Secondary | ICD-10-CM

## 2017-02-07 DIAGNOSIS — K219 Gastro-esophageal reflux disease without esophagitis: Secondary | ICD-10-CM

## 2017-02-07 DIAGNOSIS — E785 Hyperlipidemia, unspecified: Secondary | ICD-10-CM

## 2017-02-07 DIAGNOSIS — I2510 Atherosclerotic heart disease of native coronary artery without angina pectoris: Secondary | ICD-10-CM

## 2017-02-07 DIAGNOSIS — I712 Thoracic aortic aneurysm, without rupture: Principal | ICD-10-CM

## 2017-02-07 DIAGNOSIS — R079 Chest pain, unspecified: Secondary | ICD-10-CM

## 2017-02-07 DIAGNOSIS — I719 Aortic aneurysm of unspecified site, without rupture: Secondary | ICD-10-CM

## 2017-02-07 MED ORDER — POLYETHYLENE GLYCOL 3350 PO PACK
17 g | Freq: Two times a day (BID) | ORAL | Status: DC | PRN
Start: 2017-02-07 — End: 2017-02-12

## 2017-02-07 MED ORDER — GLYBURIDE MICRONIZED 3 MG PO TABS
1.5 mg | Freq: Two times a day (BID) | ORAL
Start: 2017-02-07 — End: ?

## 2017-02-07 MED ORDER — GLYBURIDE 2.5 MG PO TABS
5 mg | Freq: Every day | ORAL | Status: DC
Start: 2017-02-07 — End: 2017-02-08

## 2017-02-07 MED ORDER — ALLOPURINOL 100 MG PO TABS
ORAL
Start: 2017-02-07 — End: 2017-04-28

## 2017-02-07 MED ORDER — OMEPRAZOLE 40 MG PO CPDR
Freq: Every day | ORAL
Start: 2017-02-07 — End: ?

## 2017-02-07 MED ORDER — IPRATROPIUM-ALBUTEROL 0.5-2.5 (3) MG/3ML IN SOLN
3 mL | RESPIRATORY_TRACT | Status: DC | PRN
Start: 2017-02-07 — End: 2017-02-12

## 2017-02-07 MED ORDER — METOPROLOL TARTRATE 25 MG PO TABS
25 mg | Freq: Two times a day (BID) | ORAL | Status: DC
Start: 2017-02-07 — End: 2017-02-10

## 2017-02-07 MED ORDER — ATORVASTATIN CALCIUM 40 MG PO TABS
ORAL
Start: 2017-02-07 — End: ?

## 2017-02-07 MED ORDER — DEXTROSE 50 % IV SOLN
30 mL | INTRAVENOUS | Status: DC | PRN
Start: 2017-02-07 — End: 2017-02-12

## 2017-02-07 MED ORDER — ONDANSETRON HCL 4 MG/2ML IJ SOLN
4 mg | Freq: Four times a day (QID) | INTRAVENOUS | Status: DC | PRN
Start: 2017-02-07 — End: 2017-02-10

## 2017-02-07 MED ORDER — LOSARTAN POTASSIUM 25 MG PO TABS
25 mg | Freq: Every day | ORAL | Status: DC
Start: 2017-02-07 — End: 2017-02-12

## 2017-02-07 MED ORDER — GLUCOSE 4 G PO CHEW JX
16 g | ORAL | Status: DC | PRN
Start: 2017-02-07 — End: 2017-02-12

## 2017-02-07 MED ORDER — PANTOPRAZOLE SODIUM 40 MG PO TBEC
40 mg | Freq: Two times a day (BID) | ORAL | Status: DC
Start: 2017-02-07 — End: 2017-02-12

## 2017-02-07 MED ORDER — ASPIRIN 325 MG PO TABS
325 mg | Freq: Once | ORAL | Status: CP
Start: 2017-02-07 — End: ?

## 2017-02-07 MED ORDER — METOPROLOL TARTRATE 25 MG PO TABS
Freq: Two times a day (BID) | ORAL
Start: 2017-02-07 — End: 2017-04-28

## 2017-02-07 MED ORDER — TRAMADOL HCL 50 MG PO TABS
50 mg | Freq: Four times a day (QID) | ORAL | Status: DC | PRN
Start: 2017-02-07 — End: 2017-02-10

## 2017-02-07 MED ORDER — ASPIRIN 81 MG PO TBEC
81 mg | Freq: Every day | ORAL | Status: DC
Start: 2017-02-07 — End: 2017-02-12

## 2017-02-07 MED ORDER — INSULIN ASPART 100 UNIT/ML SC SOLN
0-3 [IU] | Freq: Three times a day (TID) | SUBCUTANEOUS | Status: DC
Start: 2017-02-07 — End: 2017-02-12

## 2017-02-07 MED ORDER — ALLOPURINOL 100 MG PO TABS
100 mg | Freq: Every day | ORAL | Status: DC
Start: 2017-02-07 — End: 2017-02-12

## 2017-02-07 MED ORDER — SODIUM CHLORIDE 0.9% FOR FLUSHES
20-180 mL | INTRAVENOUS | Status: CP | PRN
Start: 2017-02-07 — End: ?

## 2017-02-07 MED ORDER — ACETAMINOPHEN 325 MG PO TABS
650 mg | ORAL | Status: DC | PRN
Start: 2017-02-07 — End: 2017-02-11

## 2017-02-07 MED ORDER — ALUM & MAG HYDROX-SIMETH 1200-1200-120 MG/30ML PO SUSP UD
30 mL | Freq: Four times a day (QID) | ORAL | Status: DC | PRN
Start: 2017-02-07 — End: 2017-02-08

## 2017-02-07 MED ORDER — ASPIRIN 81 MG PO TABS
Freq: Every day | ORAL
Start: 2017-02-07 — End: 2017-04-28

## 2017-02-07 MED ORDER — ATORVASTATIN CALCIUM 40 MG PO TABS
40 mg | Freq: Every evening | ORAL | Status: DC
Start: 2017-02-07 — End: 2017-02-12

## 2017-02-07 MED ORDER — TRAZODONE HCL 50 MG PO TABS
50 mg | Freq: Every evening | ORAL | Status: DC | PRN
Start: 2017-02-07 — End: 2017-02-12

## 2017-02-07 MED ORDER — GLUCOSAMINE CHOND COMPLEX/MSM PO
ORAL_TABLET | ORAL
Start: 2017-02-07 — End: ?

## 2017-02-07 MED ORDER — NIACIN 500 MG PO TABS
Freq: Every day | ORAL
Start: 2017-02-07 — End: ?

## 2017-02-07 MED ORDER — IOHEXOL 350 MG/ML IV SOLN SH
100 mL | Freq: Once | INTRAVENOUS | Status: CP
Start: 2017-02-07 — End: ?

## 2017-02-07 MED ORDER — LABETALOL HCL 5 MG/ML IV SOLN SH
20 mg | Freq: Four times a day (QID) | INTRAVENOUS | Status: DC | PRN
Start: 2017-02-07 — End: 2017-02-10

## 2017-02-07 MED ORDER — LOSARTAN POTASSIUM 25 MG PO TABS
Freq: Every day | ORAL
Start: 2017-02-07 — End: 2017-04-28

## 2017-02-07 MED ORDER — ONDANSETRON 4 MG PO TBDP
4 mg | Freq: Four times a day (QID) | ORAL | Status: DC | PRN
Start: 2017-02-07 — End: 2017-02-12

## 2017-02-07 NOTE — ED Provider Notes
BUN/Creatinine Ratio 12.0  6.0 - 22.0 (calc)    Glucose 212 (*) 71 - 99 mg/dL    Calcium 9.0  8.6 - 10.0 mg/dL    Osmolality Calc 284.0      Anion Gap 10  4 - 16 mmol/L    EGFR >59  mL/min/1.73M2    Comment:   Reference range: =>90 ml/min/1.73M2  eGFR estimates are unable to accurately differentiate levels of GFR above 60 ml/min/1.73M2.   CBC AUTODIFF - Abnormal     WBC 4.57  4.5 - 11 x10E3/uL    RBC 4.49 (*) 4.50 - 6.30 x10E6/uL    Hemoglobin 14.9  14.0 - 18.0 g/dL    Hematocrit 42.8  40.0 - 54.0 %    MCV 95.3  82.0 - 101.0 fl    MCH 33.2  27.0 - 34.0 pg    MCHC 34.8  31.0 - 36.0 g/dL    RDW 13.1  12.0 - 16.1 %    Platelet Count 106 (*) 140 - 440 thou/cu mm    MPV 9.8  9.5 - 11.5 fl    nRBC % 0.0  0.0 - 1.0 %    Absolute NRBC Count 0.00      Neutrophils % 61.4  34.0 - 73.0 %    Lymphocytes % 29.5  25.0 - 45.0 %    Monocytes % 6.3 (*) 2.0 - 6.0 %    Eosinophils % 2.0  1.0 - 4.0 %    Immature Granulocytes % 0.4  0.0 - 2.0 %    Neutrophils Absolute 2.80  1.80 - 8.70 x10E3/uL    Lymphocytes Absolute 1.35  x10E3/uL    Monocytes Absolute 0.29  x10E3/uL    Eosinophils Absolute 0.09  x10E3/uL    Basophil Absolute 0.02  x10E3/uL    Absolute Immature Granulocytes 0.02 (*) 0 - 0 x10E3/uL    Basophils % 0.4  0 - 1 %   LIPASE - Normal    Lipase 49  0 - 60 U/L   TROPONIN T (ED -ONLY) - Normal    Troponin T <0.01  0.00 - 0.04 ng/ml   HEPATIC FUNCTION PANEL    Albumin 4.4  3.8 - 4.9 g/dL    Total Bilirubin 0.9  0.2 - 1.0 mg/dL    Bilirubin, Direct 0.2  0.0 - 0.2 mg/dL    Bilirubin, Indirect 0.7  <0.68m/dL mg/dL    Alkaline Phosphatase 59  40 - 129 IU/L    AST 28  14 - 33 IU/L    ALT 33  10 - 42 IU/L    Total Protein 6.8  6.5 - 8.3 g/dL    ALBUMIN/GLOBULIN RATIO 1.8  (calc)    Calc Total Globuin 2.4  gm/dL   TROPONIN T (ED -ONLY)   CBC AND DIFFERENTIAL         Imaging (Read by ED Provider):  Per Radiology: Xr Chest 2 Views Frontal & Lateral    Result Date: 02/07/2017

## 2017-02-07 NOTE — H&P
Department of Medicine  Hospitalist Service        Admit Date and Time: 02/07/2017  9:46 AM     Subjective     Chief Complaint: chest pain    History of Present Illness:  Ulus Rule is a 72 y.o. male with PMHx of CAD s/p CABG 2014, GERD, HTN, DM II who presents with chest pain/pressure that occurs every morning after he wakes up for last 2 weeks but does not recur during all day otherwise and asso with acid reflux like symptoms and gets better after he takes prilosec that he uses only as needed. No sob. No fever or chills. No cough. In ER, Patient underwent CTA chest that showed penetrating aortic ulcer and referred for admission for further management.     Past Medical History:   Diagnosis Date   ? Diabetes mellitus (CMS-HCC code)    ? GERD (gastroesophageal reflux disease)    ? Hypertension      Past Surgical History:   Procedure Laterality Date   ? APPENDECTOMY     ? CARDIAC SURGERY         Family Hx: No FHx of premature CAD  History reviewed. No pertinent family history.  Social History     Social History   ? Marital status: Married     Spouse name: N/A   ? Number of children: N/A   ? Years of education: N/A     Occupational History   ? Not on file.     Social History Main Topics   ? Smoking status: Former Smoker     Types: Cigarettes     Quit date: 2003   ? Smokeless tobacco: Never Used   ? Alcohol use No   ? Drug use: No   ? Sexual activity: Not on file     Other Topics Concern   ? Not on file     Social History Narrative   ? No narrative on file       Home Medications:  Prior to Admission medications    Medication Sig Start Date End Date Taking? Authorizing Provider   allopurinol (ZYLOPRIM) 100 MG PO Tablet by mouth.   Yes Information, Historical   aspirin 81 MG PO Tablet Take by mouth daily.   Yes Information, Historical   atorvastatin (LIPITOR) 40 MG PO Tablet by mouth.   Yes Information, Historical   glyBURIDE micronized (GLYNASE) 3 MG PO Tablet Take 1.5 mg by mouth 2 times

## 2017-02-07 NOTE — ED Notes
[facility]

## 2017-02-07 NOTE — ED Provider Notes
History     Chief Complaint   Patient presents with   ? Chest Pain       72 year old male with complaint of intermittent substernal chest pressure x2 weeks.  He has a history of a triple bypass surgery, gerd, and htn.  [place] and states that the pain has been waxing/waning.  Nothing makes it better or worse.  He reports no sob, n/v/d, abdominal pain, fever, neck pain, or hospitalizations.  He had a stress test done last year.      The history is provided by the patient. No language interpreter was used.   Chest Pain   Pain location:  Substernal area  Pain quality: aching and pressure    Pain radiates to:  Does not radiate  Pain severity:  Mild  Onset quality:  Sudden  Duration:  2 weeks  Timing:  Intermittent  Chronicity:  New  Relieved by:  None tried  Worsened by:  Nothing  Ineffective treatments:  None tried  Associated symptoms: no abdominal pain, no AICD problem, no altered mental status, no anorexia, no anxiety, no back pain, no claudication, no cough, no diaphoresis, no dizziness, no dysphagia, no fatigue, no fever, no headache, no heartburn, no lower extremity edema, no nausea, no near-syncope, no numbness, no orthopnea, no palpitations, no PND, no shortness of breath, no syncope, no vomiting and no weakness        Allergies not on file    Patient's Medications    No medications on file       No past medical history on file.    No past surgical history on file.    No family history on file.    Social History     Social History   ? Marital status: N/A     Spouse name: N/A   ? Number of children: N/A   ? Years of education: N/A     Social History Main Topics   ? Smoking status: Not on file   ? Smokeless tobacco: Not on file   ? Alcohol use Not on file   ? Drug use: Unknown   ? Sexual activity: Not on file     Other Topics Concern   ? Not on file     Social History Narrative   ? No narrative on file       Review of Systems

## 2017-02-07 NOTE — ED Provider Notes
Romberg sign. Coordination and gait normal.   Skin: Skin is warm and dry. No rash noted. He is not diaphoretic.   Psychiatric: He has a normal mood and affect. His behavior is normal. Judgment and thought content normal.   Nursing note and vitals reviewed.      Differential DDx: pe vs stemi vs gerd vs cholecystitis vs pancreatitis vs cad vs pneumonia vs other    Is this an Emergent Medical Condition? Yes - Severe Pain/Acute Onset of Symptons  409.901 FS  641.19 FS  627.732 (16) FS    ED Workup   Procedures    Labs:  - - No data to display      Imaging (Read by ED Provider):  ***      EKG (Read by ED Provider):  1013 NSR  PR 154 ms QRS 92 ms QTc 402 ms        ED Course & Re-Evaluation    A&Ox3.  Neuro intact.    Aspirin given.    Discussed results with patient and agreed to admission for further eval.    Heart Score    History: 1  EKG: 0  Age: 72  Risk factors: 2  Initial troponin: 0  Total:  5    MDM   Decide to obtain history from someone other than the patient: No    Decide to obtain previous medical records: No    Clinical Lab Test(s): Ordered and Reviewed    Diagnostic Tests (Radiology, EKG): Ordered and Reviewed    Independent Visualization (ED US, Wet Prep, Other): No    Discussed patient with NON-ED Provider: {SH ED JX MDM - ANOTHER PROVIDER:28381}      ED Disposition   ED Disposition: No ED Disposition Set      ED Clinical Impression   ED Clinical Impression:   Chest pain, unspecified type      ED Patient Status   Patient Status:   {SH ED JX PATIENT STATUS:[id number]}        ED Medical Evaluation Initiated   Medical Evaluation Initiated:  Yes, filed at 02/07/17 1009  by Nguyen, Michael, PA-C

## 2017-02-07 NOTE — ED Provider Notes
Romberg sign. Coordination and gait normal.   Skin: Skin is warm and dry. No rash noted. He is not diaphoretic.   Psychiatric: He has a normal mood and affect. His behavior is normal. Judgment and thought content normal.   Nursing note and vitals reviewed.      Differential DDx: pe vs stemi vs gerd vs cholecystitis vs pancreatitis vs cad vs pneumonia vs other    Is this an Emergent Medical Condition? Yes - Severe Pain/Acute Onset of Symptons  409.901 FS  641.19 FS  627.732 (16) FS    ED Workup   Procedures    Labs:  - - No data to display      Imaging (Read by ED Provider):  ***      EKG (Read by ED Provider):  1013 NSR  PR 154 ms QRS 92 ms QTc 402 ms        ED Course & Re-Evaluation          MDM   Decide to obtain history from someone other than the patient: No    Decide to obtain previous medical records: No    Clinical Lab Test(s): Ordered and Reviewed    Diagnostic Tests (Radiology, EKG): Ordered and Reviewed    Independent Visualization (ED US, Wet Prep, Other): No    Discussed patient with NON-ED Provider: {SH ED Lamonte SakaiJX MDM - ANOTHER PROVIDER:28381}      ED Disposition   ED Disposition: No ED Disposition Set      ED Clinical Impression   ED Clinical Impression:   Chest pain, unspecified type      ED Patient Status   Patient Status:   {SH [facility]:[id number]}        ED Medical Evaluation Initiated   Medical Evaluation Initiated:  Yes, filed at 02/07/17 1009  by Ivan AnchorsNguyen, Michael, PA-C

## 2017-02-07 NOTE — Progress Notes
Department of Case Management  Initial Discharge Planning       NAME: Mable Parisustaquio Hunton    MRN: [id number]    AGE: 72 y.o. DOB: 11-02-1945 Date of Admission: 02/07/2017      Payor: Payor: MEDICARE AETNA / Plan: MEDICARE AETNA HMO / Product Type: HMO/POS /     Primary Care Physician: Requested, New Pcp/Referring    Pharmacy: No Pharmacies Listed    Readmission Interview Information: Has pt. been readmitted in last 30 days?: No       Living Arrangements: Patient resides with whom?: Spouse/Significant Other    Advance Directive/Health Care Decision Maker: Advance Directive/Healthcare Decision Maker:  (refused) (02/07/17 1618)  Legal Documentation:      Service Currently Receiving prior to Admission:  n/a    Initial Assessment and Discharge Plan:    Additional Information: The patient was admitted for chest pain, [place] at home [place] spouse, [place] independent with adls, [facility], and have no problem getting prescriptions filled    Discharge Plan: home    Discharge Pending: resolution of chest pain    Discharge Date: Expected discharge date: 02/10/17      Nicki ReaperJalil L Bryant, RN   02/07/2017 4:19 PM

## 2017-02-07 NOTE — ED Provider Notes
Romberg sign. Coordination and gait normal.   Skin: Skin is warm and dry. No rash noted. He is not diaphoretic.   Psychiatric: He has a normal mood and affect. His behavior is normal. Judgment and thought content normal.   Nursing note and vitals reviewed.      Differential DDx: pe vs stemi vs gerd vs cholecystitis vs pancreatitis vs cad vs pneumonia vs other    Is this an Emergent Medical Condition? Yes - Severe Pain/Acute Onset of Symptons  409.901 FS  641.19 FS  627.732 (16) FS    ED Workup   Procedures    Labs:  - - No data to display      Imaging (Read by ED Provider):  ***      EKG (Read by ED Provider):  1013 NSR  PR 154 ms QRS 92 ms QTc 402 ms        ED Course & Re-Evaluation          MDM   Decide to obtain history from someone other than the patient: No    Decide to obtain previous medical records: No    Clinical Lab Test(s): Ordered and Reviewed    Diagnostic Tests (Radiology, EKG): Ordered and Reviewed    Independent Visualization (ED US, Wet Prep, Other): No    Discussed patient with NON-ED Provider: {SH ED JX MDM - ANOTHER PROVIDER:28381}      ED Disposition   ED Disposition: No ED Disposition Set      ED Clinical Impression   ED Clinical Impression:   Chest pain, unspecified type      ED Patient Status   Patient Status:   {SH ED JX PATIENT STATUS:[id number]}        ED Medical Evaluation Initiated   Medical Evaluation Initiated:  Yes, filed at 02/07/17 1009  by Nguyen, Michael, PA-C

## 2017-02-07 NOTE — Plan of Care
Problem: Discharge Planning  Goal: Safe effective discharge  DCP is home

## 2017-02-07 NOTE — H&P
The following orders were created for panel order CBC and Differential.  Procedure                               Abnormality         Status                     ---------                               -----------         ------                     CBC with Differential pa.Marland KitchenMarland Kitchen[[id number]]  Abnormal            Final result                 Please view results for these tests on the individual orders.   Basic Metabolic Panel    Collection Time: 02/07/17 10:48 AM   Result Value    Sodium 139    Potassium 4.1    Chloride 102    CO2 27    Urea Nitrogen 13    Creatinine 1.08    BUN/Creatinine Ratio 12.0    Glucose 212 (H)    Calcium 9.0    Osmolality Calc 284.0    Anion Gap 10    EGFR >59   Hepatic Function Panel    Collection Time: 02/07/17 10:48 AM   Result Value    Albumin 4.4    Total Bilirubin 0.9    Bilirubin, Direct 0.2    Bilirubin, Indirect 0.7    Alkaline Phosphatase 59    AST 28    ALT 33    Total Protein 6.8    ALBUMIN/GLOBULIN RATIO 1.8    Calc Total Globuin 2.4   Lipase    Collection Time: 02/07/17 10:48 AM   Result Value    Lipase 49   Troponin T (Ed -Only)    Collection Time: 02/07/17 10:48 AM   Result Value    Troponin T <0.01   CBC with Differential panel result    Collection Time: 02/07/17 10:48 AM   Result Value    WBC 4.57    RBC 4.49 (L)    Hemoglobin 14.9    Hematocrit 42.8    MCV 95.3    MCH 33.2    MCHC 34.8    RDW 13.1    Platelet Count 106 (L)    MPV 9.8    nRBC % 0.0    Absolute NRBC Count 0.00    Neutrophils % 61.4    Lymphocytes % 29.5    Monocytes % 6.3 (H)    Eosinophils % 2.0    Immature Granulocytes % 0.4    Neutrophils Absolute 2.80    Lymphocytes Absolute 1.35    Monocytes Absolute 0.29    Eosinophils Absolute 0.09    Basophil Absolute 0.02    Absolute Immature Granulocytes 0.02 (H)    Basophils % 0.4   Troponin T (Ed -Only)    Collection Time: 02/07/17  2:26 PM   Result Value    Troponin T <0.01       Per Radiology: Xr Chest 2 Views Frontal & Lateral    Result Date: 02/07/2017

## 2017-02-07 NOTE — ED Provider Notes
History     Chief Complaint   Patient presents with   ? Chest Pain       72 year old male with complaint of intermittent substernal chest pressure x2 weeks.  He has a history of a triple bypass surgery, dm, gerd, and htn.  [place] and states that the pain has been waxing/waning.  Nothing makes it better or worse.  He reports no sob, n/v/d, abdominal pain, fever, neck pain, or hospitalizations.  He had a stress test done last year.      The history is provided by the patient. No language interpreter was used.   Chest Pain   Pain location:  Substernal area  Pain quality: aching and pressure    Pain radiates to:  Does not radiate  Pain severity:  Mild  Onset quality:  Sudden  Duration:  2 weeks  Timing:  Intermittent  Chronicity:  New  Relieved by:  None tried  Worsened by:  Nothing  Ineffective treatments:  None tried  Associated symptoms: no abdominal pain, no AICD problem, no altered mental status, no anorexia, no anxiety, no back pain, no claudication, no cough, no diaphoresis, no dizziness, no dysphagia, no fatigue, no fever, no headache, no heartburn, no lower extremity edema, no nausea, no near-syncope, no numbness, no orthopnea, no palpitations, no PND, no shortness of breath, no syncope, no vomiting and no weakness        Allergies not on file    Patient's Medications    No medications on file       No past medical history on file.    No past surgical history on file.    No family history on file.    Social History     Social History   ? Marital status: N/A     Spouse name: N/A   ? Number of children: N/A   ? Years of education: N/A     Social History Main Topics   ? Smoking status: Not on file   ? Smokeless tobacco: Not on file   ? Alcohol use Not on file   ? Drug use: Unknown   ? Sexual activity: Not on file     Other Topics Concern   ? Not on file     Social History Narrative   ? No narrative on file       Review of Systems

## 2017-02-07 NOTE — ED Provider Notes
irregularity seen along the lateral portion of the aorta. This is most consistent with an penetrating ulcer. Early aneurysm formation considered less likely. No evidence of extravasation is seen at this time. No evidence of dissection of the thoracic aorta. Thyroid: No significant abnormalities. Mediastinum/hilum: The hila are normal in appearance bilaterally.  No mediastinal masses or adenopathy is seen.  The visualized tracheobronchial tree is normal.  A hiatal hernia is noted.   The esophagus is otherwise normal. Heart: The patient is status post median sternotomy and CABG.  There is calcific ASVD of the native coronary vessels. The RV/LV ratio is normal (less than 0.9). Lungs: The lung fields are clear bilaterally without infiltrates, volume loss, or mass lesions.  Minimal subpleural scarring/chronic lung changes are seen. There are no pleural effusions, pneumothorax, or hemothorax seen. Upper Abdomen: Limited images of the visualized upper abdomen demonstrate no acute abnormality.  Cholelithiasis is noted without CT evidence for acute cholecystitis. Osseous structures: Views of the regional skeleton demonstrate the prior median sternotomy and diffuse degenerative changes without acute fracture identified.     No evidence of pulmonary embolus. Suboptimal opacification of the aortic arch. Focal outpouching with mild irregularity seen along the lateral portion of the aorta. This is most consistent with a penetrating ulcer. Early  aneurysm formation considered less likely. Recommend CT surgery and Interventional Radiology consultation for possible endograft repair. Status post CABG. Cholelithiasis without CT evidence for acute cholecystitis. Cyndie Chimeguyen, [place] was notified of the aortic ulcer by telephone on 02/07/2017 at 1325 hours.  This individual acknowledged receipt of the information and repeated it back. Read By Shirlee More- Daniel Siragusa M.D.  Electronically Verified By Shirlee More- Daniel Siragusa M.D.  Released Date Time -

## 2017-02-07 NOTE — ED Provider Notes
02/07/2017 1:29 PM  Resident -         EKG (Read by ED Provider):  1013 NSR  PR 154 ms QRS 92 ms QTc 402 ms        ED Course & Re-Evaluation    A&Ox3.  Neuro intact.    Aspirin given.    Discussed results with patient and agreed to admission for further eval.    Heart Score    History: 1  EKG: 0  Age: 72  Risk factors: 2  Initial troponin: 0  Total:  5    MDM   Decide to obtain history from someone other than the patient: No    Decide to obtain previous medical records: No    Clinical Lab Test(s): Ordered and Reviewed    Diagnostic Tests (Radiology, EKG): Ordered and Reviewed    Independent Visualization (ED US, Wet Prep, Other): No    Discussed patient with NON-ED Provider: {SH ED JX MDM - ANOTHER PROVIDER:28381}      ED Disposition   ED Disposition: No ED Disposition Set      ED Clinical Impression   ED Clinical Impression:   Chest pain, unspecified type      ED Patient Status   Patient Status:   {SH ED JX PATIENT STATUS:[id number]}        ED Medical Evaluation Initiated   Medical Evaluation Initiated:  Yes, filed at 02/07/17 1009  by Nguyen, Michael, PA-C

## 2017-02-07 NOTE — ED Provider Notes
Pulse 02/07/17 1010 63   Resp 02/07/17 1010 26   Temp 02/07/17 1133 36.6 ?C (97.9 ?F)   Temp src 02/07/17 1133 Oral   Height 02/07/17 1010 1.829 m   Weight 02/07/17 1010 97.9 kg   SpO2 02/07/17 1010 100 %   BMI (Calculated) 02/07/17 1010 29.33             Physical Exam   Constitutional: He is oriented to person, place, and time. He appears well-developed and well-nourished. No distress.   HENT:   Head: Normocephalic and atraumatic.   Nose: Nose normal.   Mouth/Throat: Oropharynx is clear and moist.   Eyes: Pupils are equal, round, and reactive to light. Conjunctivae and EOM are normal.   Neck: Normal range of motion. Neck supple. No tracheal deviation present.   Cardiovascular: Normal rate and regular rhythm.    Pulmonary/Chest: Effort normal and breath sounds normal. No stridor. No respiratory distress.   Abdominal: Soft. There is no tenderness.   Musculoskeletal: He exhibits no edema.   Neurological: He is alert and oriented to person, place, and time. He has normal strength. He displays no atrophy. No cranial nerve deficit or sensory deficit. He exhibits normal muscle tone. He displays a negative Romberg sign. Coordination and gait normal.   Skin: Skin is warm and dry. No rash noted. He is not diaphoretic.   Psychiatric: He has a normal mood and affect. His behavior is normal. Judgment and thought content normal.   Nursing note and vitals reviewed.      Differential DDx: pe vs stemi vs gerd vs cholecystitis vs pancreatitis vs cad vs pneumonia vs other    Is this an Emergent Medical Condition? Yes - Severe Pain/Acute Onset of Symptons  409.901 FS  641.19 FS  627.732 (16) FS    ED Workup   Procedures    Labs:  -   BASIC METABOLIC PANEL - Abnormal        Result Value Ref Range    Sodium 139  135 - 145 mmol/L    Potassium 4.1  3.3 - 4.6 mmol/L    Chloride 102  101 - 110 mmol/L    CO2 27  21 - 29 mmol/L    Urea Nitrogen 13  6 - 22 mg/dL    Creatinine 1.08  0.67 - 1.17 mg/dL

## 2017-02-07 NOTE — H&P
daily (before breakfast and dinner).   Yes Information, Historical   losartan (COZAAR) 25 MG PO Tablet Take by mouth daily.   Yes Information, Historical   metoprolol tartrate (LOPRESSOR) 25 MG PO Tablet Take by mouth 2 times daily.   Yes Information, Historical   Misc Natural Products (GLUCOSAMINE CHOND COMPLEX/MSM PO) by mouth.   Yes Information, Historical   niacin 500 MG PO Tablet Take by mouth daily (with breakfast).   Yes Information, Historical   omeprazole (PriLOSEC) 40 MG PO Capsule Delayed Release Take by mouth daily.   Yes Information, Historical       Allergies:  Allergies   Allergen Reactions   ? Latex Itching       Review of Systems:  All organ systems reviewed and negative except as mentioned in HPI      Objective     Vital Signs:   Vitals:    02/07/17 1100 02/07/17 1133 02/07/17 1321 02/07/17 1410   BP: 113/68  106/60    Pulse: 54  (!) 49 59   Resp: 21  19 20   Temp:  36.6 ?C (97.9 ?F)     TempSrc:  Oral     SpO2: 97%  100% 100%   Weight:       Height:         Physical Exam:  Vitals: BP 106/60  - Pulse 59  - Temp 36.6 ?C (97.9 ?F) (Oral)  - Resp 20  - Ht 1.829 m (6')  - Wt 97.9 kg (215 lb 12.8 oz)  - SpO2 100%  - BMI 29.27 kg/m? , 100%, No intake or output data in the 24 hours ending 02/07/17 1541  General:  No acute distress  HEENT:  PERLA, No scleral icterus, Moist mucous membranes  Resp:  Clear to auscultation, No wheezing, rales, or rhonchi   CV:  S1 S2 normal, No rub or murmur, No pedal edema  GI:  Soft, No abdominal tenderness, Nondistended  Extremities:  Positive pulses, No cyanosis  Psych:  Alert and oriented to person, place, time; Normal affect  Neuro: No focal deficits  Skin: No rashes, normal turgor  Lymphatics: no cervical or axillary lymphadenopathy      Data Review     Results for orders placed or performed during the hospital encounter of 02/07/17 (from the past 48 hour(s))   CBC and Differential    Collection Time: 02/07/17 10:48 AM    Narrative

## 2017-02-07 NOTE — ED Provider Notes
Diagnostic Tests (Radiology, EKG): Ordered and Reviewed    Independent Visualization (ED US, Wet Prep, Other): No    Discussed patient with NON-ED Provider: CT surgery, Hospitalist      ED Disposition   ED Disposition: Admit      ED Clinical Impression   ED Clinical Impression:   Chest pain in adult  Type 2 diabetes mellitus without complication, unspecified whether long term insulin use (CMS-HCC code)  Hypertension, unspecified type  Penetrating ulcer of aorta (CMS-HCC code)      ED Patient Status   Patient Status:   Fair        ED Medical Evaluation Initiated   Medical Evaluation Initiated:  Yes, filed at 02/07/17 1009  by Nguyen, Michael, PA-C

## 2017-02-07 NOTE — ED Provider Notes
02/07/2017 1:29 PM  Resident -         EKG (Read by ED Provider):  1013 NSR  PR 154 ms QRS 92 ms QTc 402 ms        ED Course & Re-Evaluation    A&Ox3.  Neuro intact.    Aspirin given.    Discussed results with patient and agreed to admission for further eval.    Heart Score    History: 1  EKG: 0  Age: 72  Risk factors: 2  Initial troponin: 0  Total:  5    MDM   Decide to obtain history from someone other than the patient: No    Decide to obtain previous medical records: No    Clinical Lab Test(s): Ordered and Reviewed    Diagnostic Tests (Radiology, EKG): Ordered and Reviewed    Independent Visualization (ED US, Wet Prep, Other): No    Discussed patient with NON-ED Provider: {SH ED Lamonte SakaiJX MDM - ANOTHER PROVIDER:28381}      ED Disposition   ED Disposition: No ED Disposition Set      ED Clinical Impression   ED Clinical Impression:   Chest pain, unspecified type      ED Patient Status   Patient Status:   {[facility][id number]}        ED Medical Evaluation Initiated   Medical Evaluation Initiated:  Yes, filed at 02/07/17 1009  by Ivan AnchorsNguyen, Michael, PA-C

## 2017-02-07 NOTE — Progress Notes
Department of Case Management  Initial Discharge Planning       NAME: Riley Evans    MRN: [id number]    AGE: 72 y.o. DOB: 02/08/1945 Date of Admission: 02/07/2017      Payor: Payor: MEDICARE AETNA / Plan: MEDICARE AETNA HMO / Product Type: HMO/POS /     Primary Care Physician: Requested, New Pcp/Referring    Pharmacy: No Pharmacies Listed    Readmission Interview Information: Has pt. been readmitted in last 30 days?: No       Living Arrangements: Patient resides with whom?: Spouse/Significant Other    Advance Directive/Health Care Decision Maker: Advance Directive/Healthcare Decision Maker:  (refused) (02/07/17 1618)  Legal Documentation:      Service Currently Receiving prior to Admission:  n/a    Initial Assessment and Discharge Plan:    Additional Information: The patient was admitted for chest pain, lives at home with spouse, from [place], independent with adls, not current with HH, and have no problem getting prescriptions filled    Discharge Plan: home    Discharge Pending: resolution of chest pain    Discharge Date: Expected discharge date: 02/10/17      Jalil L Bryant, RN   02/07/2017 4:19 PM

## 2017-02-07 NOTE — ED Provider Notes
Pulse 02/07/17 1010 63   Resp 02/07/17 1010 26   Temp 02/07/17 1133 36.6 ?C (97.9 ?F)   Temp src 02/07/17 1133 Oral   Height 02/07/17 1010 1.829 m   Weight 02/07/17 1010 97.9 kg   SpO2 02/07/17 1010 100 %   BMI (Calculated) 02/07/17 1010 29.33             Physical Exam   Constitutional: He is oriented to person, place, and time. He appears well-developed and well-nourished. No distress.   HENT:   Head: Normocephalic and atraumatic.   Nose: Nose normal.   Mouth/Throat: Oropharynx is clear and moist.   Eyes: Pupils are equal, round, and reactive to light. Conjunctivae and EOM are normal.   Neck: Normal range of motion. Neck supple. No tracheal deviation present.   Cardiovascular: Normal rate and regular rhythm.    Pulmonary/Chest: Effort normal and breath sounds normal. No stridor. No respiratory distress.   Abdominal: Soft. There is no tenderness.   Musculoskeletal: He exhibits no edema.   Neurological: He is alert and oriented to person, place, and time. He has normal strength. He displays no atrophy. No cranial nerve deficit or sensory deficit. He exhibits normal muscle tone. He displays a negative Romberg sign. Coordination and gait normal.   Skin: Skin is warm and dry. No rash noted. He is not diaphoretic.   Psychiatric: He has a normal mood and affect. His behavior is normal. Judgment and thought content normal.   Nursing note and vitals reviewed.      Differential DDx: pe vs stemi vs gerd vs cholecystitis vs pancreatitis vs cad vs pneumonia vs other    Is this an Emergent Medical Condition? Yes - Severe Pain/Acute Onset of Symptons  409.901 FS  641.19 FS  627.732 (16) FS    ED Workup   Procedures    Labs:  -   BASIC METABOLIC PANEL - Abnormal        Result Value Ref Range    Sodium 139  135 - 145 mmol/L    Potassium 4.1  3.3 - 4.6 mmol/L    Chloride 102  101 - 110 mmol/L    CO2 27  21 - 29 mmol/L    Urea Nitrogen 13  6 - 22 mg/dL    Creatinine 1.611.08  0.960.67 - 1.17 mg/dL

## 2017-02-07 NOTE — ED Provider Notes
02/07/2017 1:29 PM  Resident -         EKG (Read by ED Provider):  1013 NSR  PR 154 ms QRS 92 ms QTc 402 ms        ED Course & Re-Evaluation    A&Ox3.  Neuro intact.    Aspirin given.    Consulted CT Surgery, Marina GoodellPerry PA, [place] hospitalist [place].    Discussed results with patient and agreed to admission for further eval.    Heart Score    History: 1  EKG: 0  Age: 72  Risk factors: 2  Initial troponin: 0  Total:  5    MDM   Decide to obtain history from someone other than the patient: No    Decide to obtain previous medical records: No    Clinical Lab Test(s): Ordered and Reviewed    Diagnostic Tests (Radiology, EKG): Ordered and Reviewed    Independent Visualization (ED US, Wet Prep, Other): No    Discussed patient with NON-ED Provider: CT surgery, Hospitalist      ED Disposition   ED Disposition: No ED Disposition Set      ED Clinical Impression   ED Clinical Impression:   Chest pain, unspecified type      ED Patient Status   Patient Status:   Fair        ED Medical Evaluation Initiated   Medical Evaluation Initiated:  Yes, filed at 02/07/17 1009  by Ivan AnchorsNguyen, Michael, PA-C

## 2017-02-07 NOTE — ED Provider Notes
History     Chief Complaint   Patient presents with   ? Chest Pain       72 year old male with complaint of intermittent substernal chest pressure x2 weeks.  He has a history of a triple bypass surgery, gerd, and htn.  He states that he just moved down here from [place] and states that the pain has been waxing/waning.  Nothing makes it better or worse.  He reports no sob, n/v/d, abdominal pain, fever, neck pain, or hospitalizations.  He had a stress test done last year.      The history is provided by the patient. No language interpreter was used.   Chest Pain   Pain location:  Substernal area  Pain quality: aching and pressure    Pain radiates to:  Does not radiate  Pain severity:  Mild  Onset quality:  Sudden  Duration:  2 weeks  Timing:  Intermittent  Chronicity:  New  Relieved by:  None tried  Worsened by:  Nothing  Ineffective treatments:  None tried  Associated symptoms: no abdominal pain, no AICD problem, no altered mental status, no anorexia, no anxiety, no back pain, no claudication, no cough, no diaphoresis, no dizziness, no dysphagia, no fatigue, no fever, no headache, no heartburn, no lower extremity edema, no nausea, no near-syncope, no numbness, no orthopnea, no palpitations, no PND, no shortness of breath, no syncope, no vomiting and no weakness        Allergies not on file    Patient's Medications    No medications on file       No past medical history on file.    No past surgical history on file.    No family history on file.    Social History     Social History   ? Marital status: N/A     Spouse name: N/A   ? Number of children: N/A   ? Years of education: N/A     Social History Main Topics   ? Smoking status: Not on file   ? Smokeless tobacco: Not on file   ? Alcohol use Not on file   ? Drug use: Unknown   ? Sexual activity: Not on file     Other Topics Concern   ? Not on file     Social History Narrative   ? No narrative on file       Review of Systems

## 2017-02-07 NOTE — ED Provider Notes
vessels. The RV/LV ratio is normal (less than 0.9). Lungs: The lung fields are clear bilaterally without infiltrates, volume loss, or mass lesions.  Minimal subpleural scarring/chronic lung changes are seen. There are no pleural effusions, pneumothorax, or hemothorax seen. Upper Abdomen: Limited images of the visualized upper abdomen demonstrate no acute abnormality.  Cholelithiasis is noted without CT evidence for acute cholecystitis. Osseous structures: Views of the regional skeleton demonstrate the prior median sternotomy and diffuse degenerative changes without acute fracture identified.     No evidence of pulmonary embolus. Suboptimal opacification of the aortic arch. Focal outpouching with mild irregularity seen along the lateral portion of the aorta. This is most consistent with a penetrating ulcer. Early  aneurysm formation considered less likely. Recommend CT surgery and Interventional Radiology consultation for possible endograft repair. Status post CABG. Cholelithiasis without CT evidence for acute cholecystitis. Nguyen, PA was notified of the aortic ulcer by telephone on 02/07/2017 at 1325 hours.  This individual acknowledged receipt of the information and repeated it back. Read By - Daniel Siragusa M.D.  Electronically Verified By - Daniel Siragusa M.D.  Released Date Time - 02/07/2017 1:29 PM  Resident -         EKG (Read by ED Provider):  1013 NSR  PR 154 ms QRS 92 ms QTc 402 ms  1437 Sinus bradycardia  PR 130 ms QRS 92 ms QTc409 ms      ED Course & Re-Evaluation    A&Ox3.  Neuro intact.    Aspirin given.    Consulted CT Surgery, Perry PA, admit hospitalist downtown for further treatment.    Discussed results with patient and agreed to admission for further eval.    Heart Score    History: 1  EKG: 0  Age: 3  Risk factors: 2  Initial troponin: 0  Total:  5    MDM   Decide to obtain history from someone other than the patient: No    Decide to obtain previous medical records: No

## 2017-02-07 NOTE — ED Provider Notes
Clinical Lab Test(s): Ordered and Reviewed    Diagnostic Tests (Radiology, EKG): Ordered and Reviewed    Independent Visualization (ED US, Wet Prep, Other): No    Discussed patient with NON-ED Provider: CT surgery, Hospitalist      ED Disposition   ED Disposition: Admit      ED Clinical Impression   ED Clinical Impression:   Chest pain in adult  Type 2 diabetes mellitus without complication, unspecified whether long term insulin use (CMS-HCC code)  Hypertension, unspecified type  Penetrating ulcer of aorta (CMS-HCC code)      ED Patient Status   Patient Status:   Fair        ED Medical Evaluation Initiated   Medical Evaluation Initiated:  Yes, filed at 02/07/17 1009  by Nguyen, Michael, PA-C

## 2017-02-07 NOTE — ED Provider Notes
History     Chief Complaint   Patient presents with   ? Chest Pain       72 year old male with complaint of intermittent substernal chest pressure x2 weeks.  He has a history of a triple bypass surgery, dm, gerd, and htn.  [place] and states that the pain has been waxing/waning.  Nothing makes it better or worse.  He reports no sob, n/v/d, abdominal pain, fever, neck pain, or hospitalizations.  He had a stress test done last year.      The history is provided by the patient. No language interpreter was used.   Chest Pain   Pain location:  Substernal area  Pain quality: aching and pressure    Pain radiates to:  Does not radiate  Pain severity:  Mild  Onset quality:  Sudden  Duration:  2 weeks  Timing:  Intermittent  Chronicity:  New  Relieved by:  None tried  Worsened by:  Nothing  Ineffective treatments:  None tried  Associated symptoms: no abdominal pain, no AICD problem, no altered mental status, no anorexia, no anxiety, no back pain, no claudication, no cough, no diaphoresis, no dizziness, no dysphagia, no fatigue, no fever, no headache, no heartburn, no lower extremity edema, no nausea, no near-syncope, no numbness, no orthopnea, no palpitations, no PND, no shortness of breath, no syncope, no vomiting and no weakness        Allergies   Allergen Reactions   ? Latex Itching       Patient's Medications   New Prescriptions    No medications on file   Previous Medications    ALLOPURINOL (ZYLOPRIM) 100 MG PO TABLET    by mouth.    ASPIRIN 81 MG PO TABLET    Take by mouth daily.    ATORVASTATIN (LIPITOR) 40 MG PO TABLET    by mouth.    GLYBURIDE MICRONIZED (GLYNASE) 3 MG PO TABLET    Take 1.5 mg by mouth 2 times daily (before breakfast and dinner).    LOSARTAN (COZAAR) 25 MG PO TABLET    Take by mouth daily.    METOPROLOL TARTRATE (LOPRESSOR) 25 MG PO TABLET    Take by mouth 2 times daily.    MISC NATURAL PRODUCTS (GLUCOSAMINE CHOND COMPLEX/MSM PO)    by mouth.

## 2017-02-07 NOTE — ED Triage Notes
Pt c/o chest pain x 2 weeks. Denies SOB, N/V/D, HA. States just moved here 2 weeks ago and drove about 7 hours. A&Ox4, NAD, VSS, resp even and unlabored, ambulatory with steady gait, follows commands, speaking in clear sentences, skin warm dry and intact. Oriented to room and call light.

## 2017-02-07 NOTE — ED Provider Notes
Constitutional: Negative for fever, diaphoresis and fatigue.   HENT: Negative for sore throat and trouble swallowing.    Eyes: Negative for pain and discharge.   Respiratory: Negative for cough, chest tightness and shortness of breath.    Cardiovascular: Positive for chest pain. Negative for palpitations, orthopnea, claudication, syncope, PND and near-syncope.   Gastrointestinal: Negative for heartburn, nausea, vomiting, abdominal pain, diarrhea and anorexia.   Genitourinary: Negative for dysuria and flank pain.   Musculoskeletal: Negative for myalgias, back pain, arthralgias, neck pain and neck stiffness.   Skin: Negative for color change and wound.   Neurological: Negative for dizziness, weakness, numbness and headaches.   Psychiatric/Behavioral: Negative for agitation, altered mental status and anxiety.   All other systems reviewed and are negative.      Physical Exam     ED Triage Vitals [02/07/17 1010]   BP 120/67   Pulse 63   Resp 26   Temp    Temp src    Height 1.829 m   Weight 97.9 kg   SpO2 100 %   BMI (Calculated) 29.33             Physical Exam   Constitutional: He is oriented to person, place, and time. He appears well-developed and well-nourished. No distress.   HENT:   Head: Normocephalic and atraumatic.   Nose: Nose normal.   Mouth/Throat: Oropharynx is clear and moist.   Eyes: Pupils are equal, round, and reactive to light. Conjunctivae and EOM are normal.   Neck: Normal range of motion. Neck supple. No tracheal deviation present.   Cardiovascular: Normal rate and regular rhythm.    Pulmonary/Chest: Effort normal and breath sounds normal. No stridor. No respiratory distress.   Abdominal: Soft. There is no tenderness.   Musculoskeletal: He exhibits no edema.   Neurological: He is alert and oriented to person, place, and time. He has normal strength. He displays no atrophy. No cranial nerve deficit or sensory deficit. He exhibits normal muscle tone. He displays a negative

## 2017-02-07 NOTE — H&P
Department of Medicine  Hospitalist Service        Admit Date and Time: 02/07/2017  9:46 AM     Subjective     Chief Complaint: chest pain    History of Present Illness:  Riley Evans is a 72 y.o. male with PMHx of CAD s/p CABG 2014, GERD, HTN, DM II who presents with chest pain/pressure that occurs every morning after he wakes up for last 2 weeks but does not recur during all day otherwise and asso with acid reflux like symptoms and gets better after he takes prilosec that he uses only as needed. No sob. No fever or chills. No cough. In ER, Patient underwent CTA chest that showed penetrating aortic ulcer and referred for admission for further management.     Past Medical History:   Diagnosis Date   ? Diabetes mellitus (CMS-HCC code)    ? GERD (gastroesophageal reflux disease)    ? Hypertension      Past Surgical History:   Procedure Laterality Date   ? APPENDECTOMY     ? CARDIAC SURGERY         Family Hx: No FHx of premature CAD  History reviewed. No pertinent family history.  Social History     Social History   ? Marital status: Married     Spouse name: N/A   ? Number of children: N/A   ? Years of education: N/A     Occupational History   ? Not on file.     Social History Main Topics   ? Smoking status: Former Smoker     Types: Cigarettes     Quit date: 2003   ? Smokeless tobacco: Never Used   ? Alcohol use No   ? Drug use: No   ? Sexual activity: Not on file     Other Topics Concern   ? Not on file     Social History Narrative   ? No narrative on file       Home Medications:  Prior to Admission medications    Medication Sig Start Date End Date Taking? Authorizing Provider   allopurinol (ZYLOPRIM) 100 MG PO Tablet by mouth.   Yes Information, Historical   aspirin 81 MG PO Tablet Take by mouth daily.   Yes Information, Historical   atorvastatin (LIPITOR) 40 MG PO Tablet by mouth.   Yes Information, Historical   glyBURIDE micronized (GLYNASE) 3 MG PO Tablet Take 1.5 mg by mouth 2 times

## 2017-02-07 NOTE — ED Notes
Bed: ED-04  Expected date: 02/07/17  Expected time: 9:44 AM  Means of arrival: Car  Comments:

## 2017-02-07 NOTE — ED Provider Notes
Constitutional: Negative for fever, diaphoresis and fatigue.   HENT: Negative for sore throat and trouble swallowing.    Eyes: Negative for pain and discharge.   Respiratory: Negative for cough, chest tightness and shortness of breath.    Cardiovascular: Positive for chest pain. Negative for palpitations, orthopnea, claudication, syncope, PND and near-syncope.   Gastrointestinal: Negative for heartburn, nausea, vomiting, abdominal pain, diarrhea and anorexia.   Genitourinary: Negative for dysuria and flank pain.   Musculoskeletal: Negative for myalgias, back pain, arthralgias, neck pain and neck stiffness.   Skin: Negative for color change and wound.   Neurological: Negative for dizziness, weakness, numbness and headaches.   Psychiatric/Behavioral: Negative for agitation, altered mental status and anxiety.   All other systems reviewed and are negative.      Physical Exam     ED Triage Vitals [02/07/17 1010]   BP 120/67   Pulse 63   Resp 26   Temp    Temp src    Height 1.829 m   Weight 97.9 kg   SpO2 100 %   BMI (Calculated) 29.33             Physical Exam   Constitutional: He is oriented to person, place, and time. He appears well-developed and well-nourished. No distress.   HENT:   Head: Normocephalic and atraumatic.   Nose: Nose normal.   Mouth/Throat: Oropharynx is clear and moist.   Eyes: Pupils are equal, round, and reactive to light. Conjunctivae and EOM are normal.   Neck: Normal range of motion. Neck supple. No tracheal deviation present.   Cardiovascular: Normal rate and regular rhythm.    Pulmonary/Chest: Effort normal and breath sounds normal. No stridor. No respiratory distress.   Abdominal: Soft. There is no tenderness.   Musculoskeletal: He exhibits no edema.   Neurological: He is alert and oriented to person, place, and time. He has normal strength. He displays no atrophy. No cranial nerve deficit or sensory deficit. He exhibits normal muscle tone. He displays a negative

## 2017-02-07 NOTE — ED Provider Notes
NIACIN 500 MG PO TABLET    Take by mouth daily (with breakfast).    OMEPRAZOLE (PRILOSEC) 40 MG PO CAPSULE DELAYED RELEASE    Take by mouth daily.   Modified Medications    No medications on file   Discontinued Medications    No medications on file       Past Medical History:   Diagnosis Date   ? Diabetes mellitus (CMS-HCC code)    ? GERD (gastroesophageal reflux disease)    ? Hypertension        Past Surgical History:   Procedure Laterality Date   ? APPENDECTOMY     ? CARDIAC SURGERY         History reviewed. No pertinent family history.    Social History     Social History   ? Marital status: Married     Spouse name: N/A   ? Number of children: N/A   ? Years of education: N/A     Social History Main Topics   ? Smoking status: Former Smoker     Types: Cigarettes     Quit date: 2003   ? Smokeless tobacco: Never Used   ? Alcohol use No   ? Drug use: No   ? Sexual activity: Not Asked     Other Topics Concern   ? None     Social History Narrative   ? None       Review of Systems   Constitutional: Negative for fever, diaphoresis and fatigue.   HENT: Negative for sore throat and trouble swallowing.    Eyes: Negative for pain and discharge.   Respiratory: Negative for cough, chest tightness and shortness of breath.    Cardiovascular: Positive for chest pain. Negative for palpitations, orthopnea, claudication, syncope, PND and near-syncope.   Gastrointestinal: Negative for heartburn, nausea, vomiting, abdominal pain, diarrhea and anorexia.   Genitourinary: Negative for dysuria and flank pain.   Musculoskeletal: Negative for myalgias, back pain, arthralgias, neck pain and neck stiffness.   Skin: Negative for color change and wound.   Neurological: Negative for dizziness, weakness, numbness and headaches.   Psychiatric/Behavioral: Negative for agitation, altered mental status and anxiety.   All other systems reviewed and are negative.      Physical Exam     ED Triage Vitals   BP 02/07/17 1010 120/67

## 2017-02-07 NOTE — ED Provider Notes
vessels. The RV/LV ratio is normal (less than 0.9). Lungs: The lung fields are clear bilaterally without infiltrates, volume loss, or mass lesions.  Minimal subpleural scarring/chronic lung changes are seen. There are no pleural effusions, pneumothorax, or hemothorax seen. Upper Abdomen: Limited images of the visualized upper abdomen demonstrate no acute abnormality.  Cholelithiasis is noted without CT evidence for acute cholecystitis. Osseous structures: Views of the regional skeleton demonstrate the prior median sternotomy and diffuse degenerative changes without acute fracture identified.     No evidence of pulmonary embolus. Suboptimal opacification of the aortic arch. Focal outpouching with mild irregularity seen along the lateral portion of the aorta. This is most consistent with a penetrating ulcer. Early  aneurysm formation considered less likely. Recommend CT surgery and Interventional Radiology consultation for possible endograft repair. Status post CABG. Cholelithiasis without CT evidence for acute cholecystitis. Cyndie Chimeguyen, [place] was notified of the aortic ulcer by telephone on 02/07/2017 at 1325 hours.  This individual acknowledged receipt of the information and repeated it back. Read By Shirlee More- Daniel Siragusa M.D.  Electronically Verified By Shirlee More- Daniel Siragusa M.D.  Released Date Time - 02/07/2017 1:29 PM  Resident -         EKG (Read by ED Provider):  1013 NSR  PR 154 ms QRS 92 ms QTc 402 ms  1437 Sinus bradycardia  PR 130 ms QRS 92 ms QTc409 ms      ED Course & Re-Evaluation    A&Ox3.  Neuro intact.    Aspirin given.    Consulted CT Surgery, Marina GoodellPerry PA, [place] hospitalist [place] for further treatment.    Discussed results with patient and agreed to admission for further eval.    Heart Score    History: 1  EKG: 0  Age: 72  Risk factors: 2  Initial troponin: 0  Total:  5    MDM   Decide to obtain history from someone other than the patient: No    Decide to obtain previous medical records: No

## 2017-02-07 NOTE — H&P
CHEST X-RAY frontal and lateral Comparison: None Findings: The lungs are clear.  The cardiac silhouette, mediastinum, and pulmonary vessels are within normal limits.  The osseous structures are intact.  The patient is status post median sternotomy     Impression: No acute cardiopulmonary disease. Read By Alla German- Diana Edgar M.D.  Electronically Verified By - Alla Germaniana Edgar M.D.  Released Date Time - 02/07/2017 10:52 AM  Resident -     Per Radiology: Cta Chest Pe Protocol W/o & W/ Iv Con    Result Date: 02/07/2017  CT ANGIOGRAPHY OF THE CHEST (PULMONARY EMBOLUS PROTOCOL) Clinical History: [age] years Male Chest pain in adult Comparison: No prior CTs available for comparison. Technique: Spiral, multi-slice images were obtained and reconstructed at 2mm increments from the thoracic inlet to the adrenal glands following the administration of intravenous contrast only. 3-D MIP images were made from the axial postcontrast data set and reviewed.  FINDINGS: Pulmonary Arteries: There are no filling defects within the pulmonary arterial system to suggest pulmonary embolus. Aorta and Great Vessels: Opacification of the aortic arch is suboptimal due to this study being  ordered as a PE protocol versus an aortic protocol. In the proximal descending aorta, there is  a 1 cm focal outpouching with mild irregularity seen along the lateral portion of the aorta. This is most consistent with an penetrating ulcer. Early aneurysm formation considered less likely. No evidence of extravasation is seen at this time. No evidence of dissection of the thoracic aorta. Thyroid: No significant abnormalities. Mediastinum/hilum: The hila are normal in appearance bilaterally.  No mediastinal masses or adenopathy is seen.  The visualized tracheobronchial tree is normal.  A hiatal hernia is noted.   The esophagus is otherwise normal. Heart: The patient is status post median sternotomy and CABG.  There is calcific ASVD of the native coronary

## 2017-02-07 NOTE — H&P
vessels. The RV/LV ratio is normal (less than 0.9). Lungs: The lung fields are clear bilaterally without infiltrates, volume loss, or mass lesions.  Minimal subpleural scarring/chronic lung changes are seen. There are no pleural effusions, pneumothorax, or hemothorax seen. Upper Abdomen: Limited images of the visualized upper abdomen demonstrate no acute abnormality.  Cholelithiasis is noted without CT evidence for acute cholecystitis. Osseous structures: Views of the regional skeleton demonstrate the prior median sternotomy and diffuse degenerative changes without acute fracture identified.     No evidence of pulmonary embolus. Suboptimal opacification of the aortic arch. Focal outpouching with mild irregularity seen along the lateral portion of the aorta. This is most consistent with a penetrating ulcer. Early  aneurysm formation considered less likely. Recommend CT surgery and Interventional Radiology consultation for possible endograft repair. Status post CABG. Cholelithiasis without CT evidence for acute cholecystitis. Nguyen, PA was notified of the aortic ulcer by telephone on 02/07/2017 at 1325 hours.  This individual acknowledged receipt of the information and repeated it back. Read By - Daniel Siragusa M.D.  Electronically Verified By - Daniel Siragusa M.D.  Released Date Time - 02/07/2017 1:29 PM  Resident -         Assessment and Plan     Penetrating aortic ulcer: incidental finding?  - consult CT surgery and recommended for [facility] for [facility] management    Chest pain: ? 2/2 above vs GERD  - cont PPI  - Maalox prn  - Follow CT surgery recs    CAD s/p CABG  HTN  DM II  - stable, cont home meds    DVT prophylaxis: early amb    D/w ER attending involved in care    Advanced Directives: Full Code    Chintan V Shah M.D.  02/07/2017 3:41 PM

## 2017-02-07 NOTE — ED Provider Notes
02/07/2017 1:29 PM  Resident -         EKG (Read by ED Provider):  1013 NSR  PR 154 ms QRS 92 ms QTc 402 ms        ED Course & Re-Evaluation    A&Ox3.  Neuro intact.    Aspirin given.    Consulted CT Surgery, Perry PA, admit hospitalist downtown for further treatment.    Discussed results with patient and agreed to admission for further eval.    Heart Score    History: 1  EKG: 0  Age: 72  Risk factors: 2  Initial troponin: 0  Total:  5    MDM   Decide to obtain history from someone other than the patient: No    Decide to obtain previous medical records: No    Clinical Lab Test(s): Ordered and Reviewed    Diagnostic Tests (Radiology, EKG): Ordered and Reviewed    Independent Visualization (ED US, Wet Prep, Other): No    Discussed patient with NON-ED Provider: CT surgery, Hospitalist      ED Disposition   ED Disposition: No ED Disposition Set      ED Clinical Impression   ED Clinical Impression:   Chest pain, unspecified type      ED Patient Status   Patient Status:   Fair        ED Medical Evaluation Initiated   Medical Evaluation Initiated:  Yes, filed at 02/07/17 1009  by Nguyen, Michael, PA-C

## 2017-02-07 NOTE — ED Provider Notes
History     Chief Complaint   Patient presents with   ? Chest Pain       72 year old male with complaint of intermittent substernal chest pressure x2 weeks.  He has a history of a triple bypass surgery, dm, gerd, and htn.  He states that he just moved down here from [place] and states that the pain has been waxing/waning.  Nothing makes it better or worse.  He reports no sob, n/v/d, abdominal pain, fever, neck pain, or hospitalizations.  He had a stress test done last year.      The history is provided by the patient. No language interpreter was used.   Chest Pain   Pain location:  Substernal area  Pain quality: aching and pressure    Pain radiates to:  Does not radiate  Pain severity:  Mild  Onset quality:  Sudden  Duration:  2 weeks  Timing:  Intermittent  Chronicity:  New  Relieved by:  None tried  Worsened by:  Nothing  Ineffective treatments:  None tried  Associated symptoms: no abdominal pain, no AICD problem, no altered mental status, no anorexia, no anxiety, no back pain, no claudication, no cough, no diaphoresis, no dizziness, no dysphagia, no fatigue, no fever, no headache, no heartburn, no lower extremity edema, no nausea, no near-syncope, no numbness, no orthopnea, no palpitations, no PND, no shortness of breath, no syncope, no vomiting and no weakness        Allergies not on file    Patient's Medications    No medications on file       No past medical history on file.    No past surgical history on file.    No family history on file.    Social History     Social History   ? Marital status: N/A     Spouse name: N/A   ? Number of children: N/A   ? Years of education: N/A     Social History Main Topics   ? Smoking status: Not on file   ? Smokeless tobacco: Not on file   ? Alcohol use Not on file   ? Drug use: Unknown   ? Sexual activity: Not on file     Other Topics Concern   ? Not on file     Social History Narrative   ? No narrative on file       Review of Systems

## 2017-02-07 NOTE — ED Triage Notes
Pt c/o chest pain x 2 weeks. Denies SOB, N/V/D, HA. States just moved here 2 weeks ago and drove about 7 hours. A&Ox4, NAD, VSS, resp even and unlabored, ambulatory with steady gait, follows commands, speaking in clear sentences, skin warm dry and intact. Oriented to room and call light.

## 2017-02-07 NOTE — ED Provider Notes
Diagnostic Tests (Radiology, EKG): Ordered and Reviewed    Independent Visualization (ED US, Wet Prep, Other): No    Discussed patient with NON-ED Provider: CT surgery, Hospitalist      ED Disposition   ED Disposition: Admit      ED Clinical Impression   ED Clinical Impression:   Chest pain in adult  Type 2 diabetes mellitus without complication, unspecified whether long term insulin use (CMS-HCC code)  Hypertension, unspecified type  Penetrating ulcer of aorta (CMS-HCC code)      ED Patient Status   Patient Status:   Fair        ED Medical Evaluation Initiated   Medical Evaluation Initiated:  Yes, filed at 02/07/17 1009  by Ivan AnchorsNguyen, Michael, PA-C

## 2017-02-07 NOTE — Consults
Admitted by hospitalist

## 2017-02-07 NOTE — ED Provider Notes
History     Chief Complaint   Patient presents with   ? Chest Pain       72 year old male with complaint of intermittent substernal chest pressure x2 weeks.  He has a history of a triple bypass surgery, dm, gerd, and htn.  He states that he just moved down here from [place] and states that the pain has been waxing/waning.  Nothing makes it better or worse.  He reports no sob, n/v/d, abdominal pain, fever, neck pain, or hospitalizations.  He had a stress test done last year.      The history is provided by the patient. No language interpreter was used.   Chest Pain   Pain location:  Substernal area  Pain quality: aching and pressure    Pain radiates to:  Does not radiate  Pain severity:  Mild  Onset quality:  Sudden  Duration:  2 weeks  Timing:  Intermittent  Chronicity:  New  Relieved by:  None tried  Worsened by:  Nothing  Ineffective treatments:  None tried  Associated symptoms: no abdominal pain, no AICD problem, no altered mental status, no anorexia, no anxiety, no back pain, no claudication, no cough, no diaphoresis, no dizziness, no dysphagia, no fatigue, no fever, no headache, no heartburn, no lower extremity edema, no nausea, no near-syncope, no numbness, no orthopnea, no palpitations, no PND, no shortness of breath, no syncope, no vomiting and no weakness        Allergies   Allergen Reactions   ? Latex Itching       Patient's Medications   New Prescriptions    No medications on file   Previous Medications    ALLOPURINOL (ZYLOPRIM) 100 MG PO TABLET    by mouth.    ASPIRIN 81 MG PO TABLET    Take by mouth daily.    ATORVASTATIN (LIPITOR) 40 MG PO TABLET    by mouth.    GLYBURIDE MICRONIZED (GLYNASE) 3 MG PO TABLET    Take 1.5 mg by mouth 2 times daily (before breakfast and dinner).    LOSARTAN (COZAAR) 25 MG PO TABLET    Take by mouth daily.    METOPROLOL TARTRATE (LOPRESSOR) 25 MG PO TABLET    Take by mouth 2 times daily.    MISC NATURAL PRODUCTS (GLUCOSAMINE CHOND COMPLEX/MSM PO)    by mouth.

## 2017-02-07 NOTE — ED Provider Notes
vessels. The RV/LV ratio is normal (less than 0.9). Lungs: The lung fields are clear bilaterally without infiltrates, volume loss, or mass lesions.  Minimal subpleural scarring/chronic lung changes are seen. There are no pleural effusions, pneumothorax, or hemothorax seen. Upper Abdomen: Limited images of the visualized upper abdomen demonstrate no acute abnormality.  Cholelithiasis is noted without CT evidence for acute cholecystitis. Osseous structures: Views of the regional skeleton demonstrate the prior median sternotomy and diffuse degenerative changes without acute fracture identified.     No evidence of pulmonary embolus. Suboptimal opacification of the aortic arch. Focal outpouching with mild irregularity seen along the lateral portion of the aorta. This is most consistent with a penetrating ulcer. Early  aneurysm formation considered less likely. Recommend CT surgery and Interventional Radiology consultation for possible endograft repair. Status post CABG. Cholelithiasis without CT evidence for acute cholecystitis. Nguyen, PA was notified of the aortic ulcer by telephone on 02/07/2017 at 1325 hours.  This individual acknowledged receipt of the information and repeated it back. Read By - Daniel Siragusa M.D.  Electronically Verified By - Daniel Siragusa M.D.  Released Date Time - 02/07/2017 1:29 PM  Resident -         EKG (Read by ED Provider):  1013 NSR  PR 154 ms QRS 92 ms QTc 402 ms        ED Course & Re-Evaluation    A&Ox3.  Neuro intact.    Aspirin given.    Consulted CT Surgery, Perry PA, admit hospitalist downtown for further treatment.    Discussed results with patient and agreed to admission for further eval.    Heart Score    History: 1  EKG: 0  Age: 72  Risk factors: 2  Initial troponin: 0  Total:  5    MDM   Decide to obtain history from someone other than the patient: No    Decide to obtain previous medical records: No    Clinical Lab Test(s): Ordered and Reviewed

## 2017-02-07 NOTE — ED Notes
Bed: ED-04  Expected date: 02/07/17  Expected time: 9:44 AM  Means of arrival: Car  Comments:

## 2017-02-07 NOTE — ED Notes
Pt to CT scan via wheelchair in stable conditions

## 2017-02-07 NOTE — ED Provider Notes
vessels. The RV/LV ratio is normal (less than 0.9). Lungs: The lung fields are clear bilaterally without infiltrates, volume loss, or mass lesions.  Minimal subpleural scarring/chronic lung changes are seen. There are no pleural effusions, pneumothorax, or hemothorax seen. Upper Abdomen: Limited images of the visualized upper abdomen demonstrate no acute abnormality.  Cholelithiasis is noted without CT evidence for acute cholecystitis. Osseous structures: Views of the regional skeleton demonstrate the prior median sternotomy and diffuse degenerative changes without acute fracture identified.     No evidence of pulmonary embolus. Suboptimal opacification of the aortic arch. Focal outpouching with mild irregularity seen along the lateral portion of the aorta. This is most consistent with a penetrating ulcer. Early  aneurysm formation considered less likely. Recommend CT surgery and Interventional Radiology consultation for possible endograft repair. Status post CABG. Cholelithiasis without CT evidence for acute cholecystitis. Cyndie Chimeguyen, [place] was notified of the aortic ulcer by telephone on 02/07/2017 at 1325 hours.  This individual acknowledged receipt of the information and repeated it back. Read By Shirlee More- Daniel Siragusa M.D.  Electronically Verified By Shirlee More- Daniel Siragusa M.D.  Released Date Time - 02/07/2017 1:29 PM  Resident -         EKG (Read by ED Provider):  1013 NSR  PR 154 ms QRS 92 ms QTc 402 ms        ED Course & Re-Evaluation    A&Ox3.  Neuro intact.    Aspirin given.    Consulted CT Surgery, Marina GoodellPerry PA, [place].    Discussed results with patient and agreed to admission for further eval.    Heart Score    History: 1  EKG: 0  Age: 72  Risk factors: 2  Initial troponin: 0  Total:  5    MDM   Decide to obtain history from someone other than the patient: No    Decide to obtain previous medical records: No    Clinical Lab Test(s): Ordered and Reviewed

## 2017-02-07 NOTE — H&P
vessels. The RV/LV ratio is normal (less than 0.9). Lungs: The lung fields are clear bilaterally without infiltrates, volume loss, or mass lesions.  Minimal subpleural scarring/chronic lung changes are seen. There are no pleural effusions, pneumothorax, or hemothorax seen. Upper Abdomen: Limited images of the visualized upper abdomen demonstrate no acute abnormality.  Cholelithiasis is noted without CT evidence for acute cholecystitis. Osseous structures: Views of the regional skeleton demonstrate the prior median sternotomy and diffuse degenerative changes without acute fracture identified.     No evidence of pulmonary embolus. Suboptimal opacification of the aortic arch. Focal outpouching with mild irregularity seen along the lateral portion of the aorta. This is most consistent with a penetrating ulcer. Early  aneurysm formation considered less likely. Recommend CT surgery and Interventional Radiology consultation for possible endograft repair. Status post CABG. Cholelithiasis without CT evidence for acute cholecystitis. Riley Evans, [place] was notified of the aortic ulcer by telephone on 02/07/2017 at 1325 hours.  This individual acknowledged receipt of the information and repeated it back. Read By Riley Evans M.D.  Electronically Verified By Riley Evans M.D.  Released Date Time - 02/07/2017 1:29 PM  Resident -         Assessment and Plan     Penetrating aortic ulcer: incidental finding?  - consult CT surgery and [facility]    Chest pain: ? 2/2 above vs GERD  - cont PPI  - Maalox prn  - Follow CT surgery recs    CAD s/p CABG  HTN  DM II  - stable, cont home meds    DVT prophylaxis: early amb    D/w ER attending involved in care    Advanced Directives: Full Code    Riley Evans M.D.  02/07/2017 3:41 PM

## 2017-02-07 NOTE — ED Provider Notes
Romberg sign. Coordination and gait normal.   Skin: Skin is warm and dry. No rash noted. He is not diaphoretic.   Psychiatric: He has a normal mood and affect. His behavior is normal. Judgment and thought content normal.   Nursing note and vitals reviewed.      Differential DDx: pe vs stemi vs gerd vs cholecystitis vs pancreatitis vs cad vs pneumonia vs other    Is this an Emergent Medical Condition? Yes - Severe Pain/Acute Onset of Symptons  409.901 FS  641.19 FS  627.732 (16) FS    ED Workup   Procedures    Labs:  - - No data to display      Imaging (Read by ED Provider):  ***      EKG (Read by ED Provider):  1013 NSR  PR 154 ms QRS 92 ms QTc 402 ms        ED Course & Re-Evaluation    A&Ox3.  Neuro intact.    Aspirin given.    Discussed results with patient and agreed to admission for further eval.    Heart Score    History: 1  EKG: 0  Age: 72  Risk factors: 2  Initial troponin: 0  Total:  5    MDM   Decide to obtain history from someone other than the patient: No    Decide to obtain previous medical records: No    Clinical Lab Test(s): Ordered and Reviewed    Diagnostic Tests (Radiology, EKG): Ordered and Reviewed    Independent Visualization (ED US, Wet Prep, Other): No    Discussed patient with NON-ED Provider: {SH ED Lamonte SakaiJX MDM - ANOTHER PROVIDER:28381}      ED Disposition   ED Disposition: No ED Disposition Set      ED Clinical Impression   ED Clinical Impression:   Chest pain, unspecified type      ED Patient Status   Patient Status:   {[facility][phone]}        ED Medical Evaluation Initiated   Medical Evaluation Initiated:  Yes, filed at 02/07/17 1009  by Ivan AnchorsNguyen, Michael, PA-C

## 2017-02-07 NOTE — H&P
CHEST X-RAY frontal and lateral Comparison: None Findings: The lungs are clear.  The cardiac silhouette, mediastinum, and pulmonary vessels are within normal limits.  The osseous structures are intact.  The patient is status post median sternotomy     Impression: No acute cardiopulmonary disease. Read By - Diana Edgar M.D.  Electronically Verified By - Diana Edgar M.D.  Released Date Time - 02/07/2017 10:52 AM  Resident -     Per Radiology: Cta Chest Pe Protocol W/o & W/ Iv Con    Result Date: 02/07/2017  CT ANGIOGRAPHY OF THE CHEST (PULMONARY EMBOLUS PROTOCOL) Clinical History: 72 years Male Chest pain in adult Comparison: No prior CTs available for comparison. Technique: Spiral, multi-slice images were obtained and reconstructed at 2mm increments from the thoracic inlet to the adrenal glands following the administration of intravenous contrast only. 3-D MIP images were made from the axial postcontrast data set and reviewed.  FINDINGS: Pulmonary Arteries: There are no filling defects within the pulmonary arterial system to suggest pulmonary embolus. Aorta and Great Vessels: Opacification of the aortic arch is suboptimal due to this study being  ordered as a PE protocol versus an aortic protocol. In the proximal descending aorta, there is  a 1 cm focal outpouching with mild irregularity seen along the lateral portion of the aorta. This is most consistent with an penetrating ulcer. Early aneurysm formation considered less likely. No evidence of extravasation is seen at this time. No evidence of dissection of the thoracic aorta. Thyroid: No significant abnormalities. Mediastinum/hilum: The hila are normal in appearance bilaterally.  No mediastinal masses or adenopathy is seen.  The visualized tracheobronchial tree is normal.  A hiatal hernia is noted.   The esophagus is otherwise normal. Heart: The patient is status post median sternotomy and CABG.  There is calcific ASVD of the native coronary

## 2017-02-07 NOTE — ED Notes
Report given to Shelley RN [facility]

## 2017-02-07 NOTE — ED Provider Notes
Clinical Lab Test(s): Ordered and Reviewed    Diagnostic Tests (Radiology, EKG): Ordered and Reviewed    Independent Visualization (ED US, Wet Prep, Other): No    Discussed patient with NON-ED Provider: CT surgery, Hospitalist      ED Disposition   ED Disposition: Admit      ED Clinical Impression   ED Clinical Impression:   Chest pain in adult  Type 2 diabetes mellitus without complication, unspecified whether long term insulin use (CMS-HCC code)  Hypertension, unspecified type  Penetrating ulcer of aorta (CMS-HCC code)      ED Patient Status   Patient Status:   Fair        ED Medical Evaluation Initiated   Medical Evaluation Initiated:  Yes, filed at 02/07/17 1009  by Ivan AnchorsNguyen, Michael, PA-C

## 2017-02-07 NOTE — ED Provider Notes
Romberg sign. Coordination and gait normal.   Skin: Skin is warm and dry. No rash noted. He is not diaphoretic.   Psychiatric: He has a normal mood and affect. His behavior is normal. Judgment and thought content normal.   Nursing note and vitals reviewed.      Differential DDx: pe vs stemi vs gerd vs cholecystitis vs pancreatitis vs cad vs pneumonia vs other    Is this an Emergent Medical Condition? Yes - Severe Pain/Acute Onset of Symptons  409.901 FS  641.19 FS  627.732 (16) FS    ED Workup   Procedures    Labs:  - - No data to display      Imaging (Read by ED Provider):  Per Radiology: Xr Chest 2 Views Frontal & Lateral    Result Date: 02/07/2017  CHEST X-RAY frontal and lateral Comparison: None Findings: The lungs are clear.  The cardiac silhouette, mediastinum, and pulmonary vessels are within normal limits.  The osseous structures are intact.  The patient is status post median sternotomy     Impression: No acute cardiopulmonary disease. Read By Alla German- Diana Edgar M.D.  Electronically Verified By - Alla Germaniana Edgar M.D.  Released Date Time - 02/07/2017 10:52 AM  Resident -     Per Radiology: Cta Chest Pe Protocol W/o & W/ Iv Con    Result Date: 02/07/2017  CT ANGIOGRAPHY OF THE CHEST (PULMONARY EMBOLUS PROTOCOL) Clinical History: [age] years Male Chest pain in adult Comparison: No prior CTs available for comparison. Technique: Spiral, multi-slice images were obtained and reconstructed at 2mm increments from the thoracic inlet to the adrenal glands following the administration of intravenous contrast only. 3-D MIP images were made from the axial postcontrast data set and reviewed.  FINDINGS: Pulmonary Arteries: There are no filling defects within the pulmonary arterial system to suggest pulmonary embolus. Aorta and Great Vessels: Opacification of the aortic arch is suboptimal due to this study being  ordered as a PE protocol versus an aortic protocol. In the proximal descending aorta, there is  a 1 cm focal outpouching with mild

## 2017-02-07 NOTE — ED Provider Notes
Romberg sign. Coordination and gait normal.   Skin: Skin is warm and dry. No rash noted. He is not diaphoretic.   Psychiatric: He has a normal mood and affect. His behavior is normal. Judgment and thought content normal.   Nursing note and vitals reviewed.      Differential DDx: pe vs stemi vs gerd vs cholecystitis vs pancreatitis vs cad vs pneumonia vs other    Is this an Emergent Medical Condition? Yes - Severe Pain/Acute Onset of Symptons  409.901 FS  641.19 FS  627.732 (16) FS    ED Workup   Procedures    Labs:  - - No data to display      Imaging (Read by ED Provider):  Per Radiology: Xr Chest 2 Views Frontal & Lateral    Result Date: 02/07/2017  CHEST X-RAY frontal and lateral Comparison: None Findings: The lungs are clear.  The cardiac silhouette, mediastinum, and pulmonary vessels are within normal limits.  The osseous structures are intact.  The patient is status post median sternotomy     Impression: No acute cardiopulmonary disease. Read By - Diana Edgar M.D.  Electronically Verified By - Diana Edgar M.D.  Released Date Time - 02/07/2017 10:52 AM  Resident -     Per Radiology: Cta Chest Pe Protocol W/o & W/ Iv Con    Result Date: 02/07/2017  CT ANGIOGRAPHY OF THE CHEST (PULMONARY EMBOLUS PROTOCOL) Clinical History: 72 years Male Chest pain in adult Comparison: No prior CTs available for comparison. Technique: Spiral, multi-slice images were obtained and reconstructed at 2mm increments from the thoracic inlet to the adrenal glands following the administration of intravenous contrast only. 3-D MIP images were made from the axial postcontrast data set and reviewed.  FINDINGS: Pulmonary Arteries: There are no filling defects within the pulmonary arterial system to suggest pulmonary embolus. Aorta and Great Vessels: Opacification of the aortic arch is suboptimal due to this study being  ordered as a PE protocol versus an aortic protocol. In the proximal descending aorta, there is  a 1 cm focal outpouching with mild

## 2017-02-07 NOTE — ED Notes
Pt to CT scan via wheelchair in stable conditions

## 2017-02-07 NOTE — ED Provider Notes
Romberg sign. Coordination and gait normal.   Skin: Skin is warm and dry. No rash noted. He is not diaphoretic.   Psychiatric: He has a normal mood and affect. His behavior is normal. Judgment and thought content normal.   Nursing note and vitals reviewed.      Differential DDx: ***    Is this an Emergent Medical Condition? {SH ED EMERGENT MEDICAL CONDITION:[id number]}  409.901 FS  641.19 FS  627.732 (16) FS    ED Workup   Procedures    Labs:  - - No data to display      Imaging (Read by ED Provider):  {Imaging findings:[id number]}      EKG (Read by ED Provider):  {EKG findings:1603150102}        ED Course & Re-Evaluation          MDM   Decide to obtain history from someone other than the patient: {SH ED JX MDM - OBTAIN HISTORY:28378}    Decide to obtain previous medical records: {SH ED JX MDM - PREVIOUS MED REC - NO YES:28380}    Clinical Lab Test(s): {SH ED JX MDM ORDERED AND REVIEWED:28124}    Diagnostic Tests (Radiology, EKG): {SH ED JX MDM ORDERED AND REVIEWED:28124}    Independent Visualization (ED US, Wet Prep, Other): {SH ED JX MDM NO YES WILDCARD:26444}    Discussed patient with NON-ED Provider: {SH ED JX MDM - ANOTHER PROVIDER:28381}      ED Disposition   ED Disposition: No ED Disposition Set      ED Clinical Impression   ED Clinical Impression:   Chest pain, unspecified type      ED Patient Status   Patient Status:   {SH ED JX PATIENT STATUS:[id number]}        ED Medical Evaluation Initiated   Medical Evaluation Initiated:  Yes, filed at 02/07/17 1009  by Nguyen, Michael, PA-C

## 2017-02-07 NOTE — ED Provider Notes
NIACIN 500 MG PO TABLET    Take by mouth daily (with breakfast).    OMEPRAZOLE (PRILOSEC) 40 MG PO CAPSULE DELAYED RELEASE    Take by mouth daily.   Modified Medications    No medications on file   Discontinued Medications    No medications on file       Past Medical History:   Diagnosis Date   ? Diabetes mellitus (CMS-HCC code)    ? GERD (gastroesophageal reflux disease)    ? Hypertension        Past Surgical History:   Procedure Laterality Date   ? APPENDECTOMY     ? CARDIAC SURGERY         History reviewed. No pertinent family history.    Social History     Social History   ? Marital status: Married     Spouse name: N/A   ? Number of children: N/A   ? Years of education: N/A     Social History Main Topics   ? Smoking status: Former Smoker     Types: Cigarettes     Quit date: 2003   ? Smokeless tobacco: Never Used   ? Alcohol use No   ? Drug use: No   ? Sexual activity: Not Asked     Other Topics Concern   ? None     Social History Narrative   ? None       Review of Systems   Constitutional: Negative for fever, diaphoresis and fatigue.   HENT: Negative for sore throat and trouble swallowing.    Eyes: Negative for pain and discharge.   Respiratory: Negative for cough, chest tightness and shortness of breath.    Cardiovascular: Positive for chest pain. Negative for palpitations, orthopnea, claudication, syncope, PND and near-syncope.   Gastrointestinal: Negative for heartburn, nausea, vomiting, abdominal pain, diarrhea and anorexia.   Genitourinary: Negative for dysuria and flank pain.   Musculoskeletal: Negative for myalgias, back pain, arthralgias, neck pain and neck stiffness.   Skin: Negative for color change and wound.   Neurological: Negative for dizziness, weakness, numbness and headaches.   Psychiatric/Behavioral: Negative for agitation, altered mental status and anxiety.   All other systems reviewed and are negative.      Physical Exam     ED Triage Vitals   BP 02/07/17 1010 120/67

## 2017-02-07 NOTE — ED Provider Notes
irregularity seen along the lateral portion of the aorta. This is most consistent with an penetrating ulcer. Early aneurysm formation considered less likely. No evidence of extravasation is seen at this time. No evidence of dissection of the thoracic aorta. Thyroid: No significant abnormalities. Mediastinum/hilum: The hila are normal in appearance bilaterally.  No mediastinal masses or adenopathy is seen.  The visualized tracheobronchial tree is normal.  A hiatal hernia is noted.   The esophagus is otherwise normal. Heart: The patient is status post median sternotomy and CABG.  There is calcific ASVD of the native coronary vessels. The RV/LV ratio is normal (less than 0.9). Lungs: The lung fields are clear bilaterally without infiltrates, volume loss, or mass lesions.  Minimal subpleural scarring/chronic lung changes are seen. There are no pleural effusions, pneumothorax, or hemothorax seen. Upper Abdomen: Limited images of the visualized upper abdomen demonstrate no acute abnormality.  Cholelithiasis is noted without CT evidence for acute cholecystitis. Osseous structures: Views of the regional skeleton demonstrate the prior median sternotomy and diffuse degenerative changes without acute fracture identified.     No evidence of pulmonary embolus. Suboptimal opacification of the aortic arch. Focal outpouching with mild irregularity seen along the lateral portion of the aorta. This is most consistent with a penetrating ulcer. Early  aneurysm formation considered less likely. Recommend CT surgery and Interventional Radiology consultation for possible endograft repair. Status post CABG. Cholelithiasis without CT evidence for acute cholecystitis. Nguyen, PA was notified of the aortic ulcer by telephone on 02/07/2017 at 1325 hours.  This individual acknowledged receipt of the information and repeated it back. Read By - Daniel Siragusa M.D.  Electronically Verified By - Daniel Siragusa M.D.  Released Date Time -

## 2017-02-07 NOTE — ED Provider Notes
Romberg sign. Coordination and gait normal.   Skin: Skin is warm and dry. No rash noted. He is not diaphoretic.   Psychiatric: He has a normal mood and affect. His behavior is normal. Judgment and thought content normal.   Nursing note and vitals reviewed.      Differential DDx: ***    Is this an Emergent Medical Condition? {SH ED EMERGENT MEDICAL CONDITION:[phone]}  409.901 FS  641.19 FS  627.732 (16) FS    ED Workup   Procedures    Labs:  - - No data to display      Imaging (Read by ED Provider):  {Imaging [phone]}      EKG (Read by ED Provider):  {EKG findings:[phone]}        ED Course & Re-Evaluation          MDM   Decide to obtain history from someone other than the patient: {SH ED Lamonte SakaiJX MDM - OBTAIN ZOXWRUE:45409}HISTORY:28378}    Decide to obtain previous medical records: [facility]{SH ED Lamonte SakaiJX MDM - PREVIOUS MED REC - NO WJX:91478}YES:28380}    Clinical Lab Test(s): {SH ED Lamonte SakaiJX MDM ORDERED AND REVIEWED:28124}    Diagnostic Tests (Radiology, EKG): {SH ED Lamonte SakaiJX MDM ORDERED AND REVIEWED:28124}    Independent Visualization (ED US, Wet Prep, Other): {SH ED Lamonte SakaiJX MDM NO YES GNFAOZHY:86578}WILDCARD:26444}    Discussed patient with NON-ED Provider: {SH ED Lamonte SakaiJX MDM - ANOTHER PROVIDER:28381}      ED Disposition   ED Disposition: No ED Disposition Set      ED Clinical Impression   ED Clinical Impression:   Chest pain, unspecified type      ED Patient Status   Patient Status:   {[facility][phone]}        ED Medical Evaluation Initiated   Medical Evaluation Initiated:  Yes, filed at 02/07/17 1009  by Ivan AnchorsNguyen, Michael, PA-C

## 2017-02-07 NOTE — H&P
daily (before breakfast and dinner).   Yes Information, Historical   losartan (COZAAR) 25 MG PO Tablet Take by mouth daily.   Yes Information, Historical   metoprolol tartrate (LOPRESSOR) 25 MG PO Tablet Take by mouth 2 times daily.   Yes Information, Historical   Misc Natural Products (GLUCOSAMINE CHOND COMPLEX/MSM PO) by mouth.   Yes Information, Historical   niacin 500 MG PO Tablet Take by mouth daily (with breakfast).   Yes Information, Historical   omeprazole (PriLOSEC) 40 MG PO Capsule Delayed Release Take by mouth daily.   Yes Information, Historical       Allergies:  Allergies   Allergen Reactions   ? Latex Itching       Review of Systems:  All organ systems reviewed and negative except as mentioned in HPI      Objective     Vital Signs:   Vitals:    02/07/17 1100 02/07/17 1133 02/07/17 1321 02/07/17 1410   BP: 113/68  106/60    Pulse: 54  (!) 49 59   Resp: 21  19 20    Temp:  36.6 ?C (97.9 ?F)     TempSrc:  Oral     SpO2: 97%  100% 100%   Weight:       Height:         Physical Exam:  Vitals: BP 106/60  - Pulse 59  - Temp 36.6 ?C (97.9 ?F) (Oral)  - Resp 20  - Ht 1.829 m (6')  - Wt 97.9 kg (215 lb 12.8 oz)  - SpO2 100%  - BMI 29.27 kg/m? , 100%, No intake or output data in the 24 hours ending 02/07/17 1541  General:  No acute distress  HEENT:  PERLA, No scleral icterus, Moist mucous membranes  Resp:  Clear to auscultation, No wheezing, rales, or rhonchi   CV:  S1 S2 normal, No rub or murmur, No pedal edema  GI:  Soft, No abdominal tenderness, Nondistended  Extremities:  Positive pulses, No cyanosis  Psych:  Alert and oriented to person, place, time; Normal affect  Neuro: No focal deficits  Skin: No rashes, normal turgor  Lymphatics: no cervical or axillary lymphadenopathy      Data Review     Results for orders placed or performed during the hospital encounter of 02/07/17 (from the past 48 hour(s))   CBC and Differential    Collection Time: 02/07/17 10:48 AM    Narrative

## 2017-02-08 DIAGNOSIS — Z87891 Personal history of nicotine dependence: Secondary | ICD-10-CM

## 2017-02-08 DIAGNOSIS — I712 Thoracic aortic aneurysm, without rupture: Principal | ICD-10-CM

## 2017-02-08 DIAGNOSIS — Z7982 Long term (current) use of aspirin: Secondary | ICD-10-CM

## 2017-02-08 DIAGNOSIS — I2510 Atherosclerotic heart disease of native coronary artery without angina pectoris: Secondary | ICD-10-CM

## 2017-02-08 DIAGNOSIS — I10 Essential (primary) hypertension: Secondary | ICD-10-CM

## 2017-02-08 DIAGNOSIS — Z7984 Long term (current) use of oral hypoglycemic drugs: Secondary | ICD-10-CM

## 2017-02-08 DIAGNOSIS — Z79899 Other long term (current) drug therapy: Secondary | ICD-10-CM

## 2017-02-08 DIAGNOSIS — Z951 Presence of aortocoronary bypass graft: Secondary | ICD-10-CM

## 2017-02-08 DIAGNOSIS — E785 Hyperlipidemia, unspecified: Secondary | ICD-10-CM

## 2017-02-08 DIAGNOSIS — E119 Type 2 diabetes mellitus without complications: Secondary | ICD-10-CM

## 2017-02-08 DIAGNOSIS — K219 Gastro-esophageal reflux disease without esophagitis: Secondary | ICD-10-CM

## 2017-02-08 MED ORDER — ALUM & MAG HYDROX-SIMETH 1200-1200-120 MG/30ML PO SUSP UD
30 mL | Freq: Three times a day (TID) | ORAL | Status: DC
Start: 2017-02-08 — End: 2017-02-12

## 2017-02-08 MED ORDER — CEFAZOLIN SODIUM 1 G IJ SOLR
2 g | Freq: Once | INTRAVENOUS | Status: CN
Start: 2017-02-08 — End: ?

## 2017-02-08 MED ORDER — ACETAMINOPHEN 500 MG PO TABS
1000 mg | Freq: Once | ORAL | Status: CP
Start: 2017-02-08 — End: ?

## 2017-02-08 MED ORDER — IOHEXOL 350 MG/ML IV SOLN SH
130 mL | Freq: Once | INTRAVENOUS | Status: CP
Start: 2017-02-08 — End: ?

## 2017-02-08 MED ORDER — GABAPENTIN 300 MG PO CAPS
600 mg | Freq: Three times a day (TID) | ORAL | Status: DC
Start: 2017-02-08 — End: 2017-02-12

## 2017-02-08 MED ORDER — BOLUS IV FLUID JX
100 mL | INTRAVENOUS | Status: CP | PRN
Start: 2017-02-08 — End: ?

## 2017-02-08 MED ORDER — SODIUM CHLORIDE 0.9% FOR FLUSHES
20-180 mL | INTRAVENOUS | Status: DC | PRN
Start: 2017-02-08 — End: 2017-02-09

## 2017-02-08 MED ORDER — ACETAMINOPHEN 500 MG PO TABS
1000 mg | Freq: Once | ORAL | Status: DC
Start: 2017-02-08 — End: 2017-02-08

## 2017-02-08 NOTE — Progress Notes
Department of Case Management  Discharge Planning Progress Note       NAME: Riley Evans    MRN: [id number]    AGE: 72 y.o. DOB: 09/03/1945 Date of Admission: 02/07/2017    Payor: Payor: MEDICARE [employer]ET[employer] / Plan: MEDICARE [employer]ET[employer] HMO / Product Type: HMO/POS /     Advance Directive/Healthcare Decision Maker: Advance Directive/Healthcare Decision Maker:  (refused) (02/07/17 1618)  Legal Documentation:      Current Medical Status: Resting    The patient is currently: Inpatient    Case Manager Assessment/Actions Taken: Transferred to this CM census today. CM at [facility] did assessment. Received consult for medical records. Consult sent to [facility] to obtain records.     Potential barriers and planned intervention: None    Interdisciplinary updates: None    Anticipated discharge plan: Home to [place]    Expected discharge date: TBD    Mary W Sexton, RN    02/08/2017 12:17 PM

## 2017-02-08 NOTE — Consults
Consult received. See CM note. Mary W Sexton, RN/ACM

## 2017-02-08 NOTE — Progress Notes
Pt arrived on floor via ambulance stretcher. Pt able to ambulate to room independently. Pt denies any c/p at this time. VSS NAD. Pt's skin intact. Pt oriented to room. Call bell in reach.

## 2017-02-08 NOTE — Consults
Adrenal Glands:  The adrenal glands are normal in appearance.    Bowel: The regional bowel is normal in appearance.  No evidence of bowel obstruction is seen.  ?  Osseous structures: Views of the regional skeleton demonstrate it to be normal in appearance.  ?  IMPRESSION:  Impression:  ?  Redemonstration of focal outpouching at the proximal lateral descending aorta likely   representing a penetrating ulcer. Once again focal aneurysm remains in the differential, though   less likely.  ?  Mild ectasia of the abdominal aorta measuring up to 3.3 cm.  ?  No significant stenosis or focal occlusions noted throughout the aortoiliac or proximal femoral   vasculature.     Assessment & Plan:      72YOM with incidental findings of a penetrating ulcer in the descending aorta distal to the left subclavian artery. This is not the cause of the patient's current synptoms but is a semiurgent condition that intervention is recommended.  Plan TEVAR, short covered stent, tomorrow.  We had long discussion with the patient and his wife informing them together that cardiovascular and thoracic surgery is high risk and highly complex. The surgery may result in complications. We discussed alternative options and the risks and benefits of each. We informed the patient that the disease process has been present for a long time and may result in poor outcome.  We may find at the time of surgery that other necessary procedures should be performed. They asked appropriate questions and agreed to proceed with surgery and all indicated procedures.    Risks explained including but not limited to bleeding, MI, infection, CVA, DVT, PE, resp failure, renal failure, heart failure, valve failure, need for anticoagulation, early graft closure, heart arrhythmias, need for pacemaker, nerve injury,  repeat surgery and death.      Riley Evans  02/08/2017  3:33 PM

## 2017-02-08 NOTE — Progress Notes
?   Chest pain, atypical - ACS rule out with troponins and EKG - patient history suggest GI etiology of his chest pain, will keep on PPI and Maalox - patient reports he had a stress status in November 2018 by his [facility] [place] which was reported as completely normal, case management consulted to get records regarding stress test results  ? Coronary artery disease status post CABG 2014 - continue aspirin and statin, beta blocker  ? Hypertension, controlled - continue on metoprolol, losartan  ? Hyperlipidemia - statin  ? Diet-controlled diabetes mellitus 2, insulin sliding scale as needed    DVT prophylaxis  full code      Riley Burney GauzeNaga S Muttevi, MD  2:57 PM

## 2017-02-08 NOTE — Consults
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    Adnan Keenum is a 72 y.o. male who has been admitted for <principal problem not specified>.  This consultation was requested by Dr. Shah.    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    Home Medications:  Prescriptions Prior to Admission   Medication Sig   ? allopurinol (ZYLOPRIM) 100 MG PO Tablet by mouth.   ? aspirin 81 MG PO Tablet Take by mouth daily.   ? atorvastatin (LIPITOR) 40 MG PO Tablet by mouth.   ? glyBURIDE micronized (GLYNASE) 3 MG PO Tablet Take 1.5 mg by mouth 2 times daily (before breakfast and dinner).   ? losartan (COZAAR) 25 MG PO Tablet Take by mouth daily.   ? metoprolol tartrate (LOPRESSOR) 25 MG PO Tablet Take by mouth 2 times daily.   ? Misc Natural Products (GLUCOSAMINE CHOND COMPLEX/MSM PO) by mouth.   ? niacin 500 MG PO Tablet Take by mouth daily (with breakfast).   ? omeprazole (PriLOSEC) 40 MG PO Capsule Delayed Release Take by mouth daily.     Current Hospital Medications:  Scheduled:   ? [START ON 02/09/2017] acetaminophen  1,000 mg Oral Once   ? allopurinol  100 mg Oral daily   ? aluminum-magnesium-simethicone hydroxide  30 mL Oral TID W/ Meals   ? aspirin  81 mg Oral daily   ? atorvastatin  40 mg Oral Nightly   ? [START ON 02/09/2017] gabapentin  600 mg Oral TID   ? insulin aspart  0-3 Units Subcutaneous TID AC   ? losartan  25 mg Oral daily   ? metoprolol tartrate  25 mg Oral BID   ? pantoprazole  40 mg Oral BID AC     Continuous Infusions:   ? bolus IV fluid       PRN:   bolus IV fluid 100 mL Intravenous PRN   acetaminophen 650 mg Oral Q4H PRN   dextrose 30 mL Intravenous PRN   glucose 16 g Oral PRN   ipratropium-albuterol 3 mL Nebulization Q4H PRN   labetalol 20 mg Intravenous Q6H PRN   ondansetron 4 mg Oral Q6H PRN   Or

## 2017-02-08 NOTE — Progress Notes
Shift 0700-1459 1500-2259 2300-0659 24 Hour Total 0700-1459 1500-2259 2300-0659 24 Hour Total   I  N  T  A  K  E   P.O.  240  240 120   120      P.O.  240  240 120   120    Shift Total  240  240 120   120   O  U  T  P  U  T   Urine              Urine Occurrence  1 x  1 x        Shift Total           NET  240  240 120   120       Physical Exam:  General: alert, cooperative, no distress  Chest: Lungs:clear to auscultation bilaterally  Cardiac: regular rate and rhythm, S1, S2 normal, no murmur, click, rub or gallop  Abdomen: soft, non-tender; bowel sounds normal; no masses,  no organomegaly  Extremities: no edema, redness or tenderness in the calves or thighs    Data Review:   Recent Labs      02/07/17   1048  02/08/17   0444   WBC  4.57  6.21   HGB  14.9  15.3   PLATCOUNT  106*  101*   MCV  95.3  93.1     Recent Labs      02/07/17   1048  02/08/17   0444   NA  139  145   K  4.1  4.0   BUN  13  13   CREATININE  1.08  1.05   GLU  212*  125*   CALCIUM  9.0  9.0     Recent Labs      02/07/17   1048   AST  28   ALT  33     No results for input(s): CKMB in the last 72 hours.    Invalid input(s): TROPONIN  No results for input(s): INR in the last 72 hours.       Assessment and Plan:       Pt with hx of hypertension, hyperlipidemia, diabetes mellitus type 2, gastroesophageal reflux disease, coronary disease status post CABG 2014 presented to the [facility] emergency room with complaints of chest pain. Had a CTA of the chest in the emergency room to rule out any pulmonary embolism, no PE noted on CTA but there is a concern of her penetrating ulcer in the aorta and so [facility] and cardiac thoracic surgery was consulted      ? Penetrating aortic ulcer, noted incidentally on CTA of the chest in the emergency room - discussed with cardiology thoracic surgery, will get CTA dissection protocol - plan for repair 02/09/2017 if ulcer confirmed

## 2017-02-08 NOTE — Plan of Care
Problem: Pain, Acute & Chronic  Goal: Pain is relieved/acceptable level with minimal side effects    Intervention: Assess pain level  Complaint of chest discomfort, but refused pain medication. Repositioned self for comfort      Problem: Risk for Injury R/T Falls  Goal: Prevent falls  Fall precautions reinforced. Pt and family  Encouraged to call for assistance. Call bell within reach

## 2017-02-08 NOTE — Consults
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    Riley Evans is a 72 y.o. male who has been admitted for <principal problem not specified>.  This consultation was requested by Dr. Sherryll BurgerShah.    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    Home Medications:  Prescriptions Prior to Admission   Medication Sig   ? allopurinol (ZYLOPRIM) 100 MG PO Tablet by mouth.   ? aspirin 81 MG PO Tablet Take by mouth daily.   ? atorvastatin (LIPITOR) 40 MG PO Tablet by mouth.   ? glyBURIDE micronized (GLYNASE) 3 MG PO Tablet Take 1.5 mg by mouth 2 times daily (before breakfast and dinner).   ? losartan (COZAAR) 25 MG PO Tablet Take by mouth daily.   ? metoprolol tartrate (LOPRESSOR) 25 MG PO Tablet Take by mouth 2 times daily.   ? Misc Natural Products (GLUCOSAMINE CHOND COMPLEX/MSM PO) by mouth.   ? niacin 500 MG PO Tablet Take by mouth daily (with breakfast).   ? omeprazole (PriLOSEC) 40 MG PO Capsule Delayed Release Take by mouth daily.     Current Hospital Medications:  Scheduled:   ? [START ON 02/09/2017] acetaminophen  1,000 mg Oral Once   ? allopurinol  100 mg Oral daily   ? aluminum-magnesium-simethicone hydroxide  30 mL Oral TID W/ Meals   ? aspirin  81 mg Oral daily   ? atorvastatin  40 mg Oral Nightly   ? [START ON 02/09/2017] gabapentin  600 mg Oral TID   ? insulin aspart  0-3 Units Subcutaneous TID AC   ? losartan  25 mg Oral daily   ? metoprolol tartrate  25 mg Oral BID   ? pantoprazole  40 mg Oral BID AC     Continuous Infusions:   ? bolus IV fluid       PRN:   bolus IV fluid 100 mL Intravenous PRN   acetaminophen 650 mg Oral Q4H PRN   dextrose 30 mL Intravenous PRN   glucose 16 g Oral PRN   ipratropium-albuterol 3 mL Nebulization Q4H PRN   labetalol 20 mg Intravenous Q6H PRN   ondansetron 4 mg Oral Q6H PRN   Or

## 2017-02-08 NOTE — Progress Notes
Pt arrived on floor via ambulance stretcher. Pt able to ambulate to room independently. Pt denies any c/p at this time. VSS NAD. Pt's skin intact. Pt oriented to room. Call bell in reach.

## 2017-02-08 NOTE — Consults
ALT 33 02/07/2017    EGFR >59 02/08/2017    TBILI 0.9 02/07/2017    ALKPHOS 59 02/07/2017    MG 2.2 02/08/2017    and Coagulation: No results found for: PROTIME, LABPROT, INR, PTT      CTA dissection protocol.  ?  Technique: Axial images of the chest, abdomen, and pelvis were obtained from the level of the   thoracic inlet to the pubic symphysis before and following administration of intravenous   contrast per dissection protocol.  3-D MIP images were made from the axial postcontrast data   set and reviewed.    ?  History: 72 years Male Atheromatous plaque  ?  Comparison: CTA February 07, 2017  ?  Findings:  ?  Aorta and Great Vessels: Focal lateral outpouching redemonstrated at the proximal descending   aorta, once again. To be a penetrating ulcer. No evidence of significant dissection.  Pulmonary Arteries: There are no filling defects within the pulmonary arterial system to   suggest pulmonary embolus.  ?  Abdominal/Pelvic Vasculature: Long segment mild ectasia of the abdominal aorta measuring up to   3.3 cm. Nonflow limiting vascular and soft plaque visualized throughout the thoracic and   abdominal aorta. No focal occlusions or significant stenosis seen at the aortoiliac or proximal   femoral vasculature.   Thyroid: No significant abnormalities.  Mediastinum/hilum: No mediastinal masses or adenopathy is seen. The visualized esophagus is   normal.  Heart: Prior median sternotomy. Coronary vascular calcification is seen.  Lungs: The lung fields are clear bilaterally without infiltrates, volume loss, or mass lesions.  ?  Liver and Biliary Tree: Views of the liver demonstrate it to be normal in appearance without   mass lesions or other abnormalities. Gallbladder sludge is noted without evidence of acute   cholecystitis.  Spleen: The spleen is normal in appearance.  Pancreas: The pancreas is normal in appearance without masses, cystic lesions, or ductal   dilatation.  Kidneys: Left fluid attenuating renal cyst.

## 2017-02-08 NOTE — Consults
ondansetron 4 mg Intravenous Q6H PRN   polyethylene glycol 17 g Oral BID PRN   traMADol 50 mg Oral Q6H PRN   traZODone 50 mg Oral Nightly PRN     Allergies:  Latex    Review of Systems:  Review of systems not obtained due to patient factors.    Objective:      Patient Vitals for the past 8 hrs:   BP Temp Temp src Pulse Resp SpO2   02/08/17 1153 120/72 36.4 ?C (97.5 ?F) Oral - 21 -   02/08/17 1050 116/65 - - - - -   02/08/17 0800 - - - - - 99 %   02/08/17 0734 105/76 36.7 ?C (98.1 ?F) Oral 72 18 -       Pain Score  Pain Rating ([facility] Only) : 0      Intake/Output Summary (Last 24 hours) at 02/08/17 1533  Last data filed at 02/08/17 1000   Gross per 24 hour   Intake              360 ml   Output                0 ml   Net              360 ml         Physical Exam:  BP 120/72  - Pulse 72  - Temp 36.4 ?C (97.5 ?F) (Oral)  - Resp 21  - Ht 1.829 m (6')  - Wt 96.2 kg (212 lb)  - SpO2 99%  - BMI 28.75 kg/m?   General appearance: alert, cooperative, no distress, appears stated age  Lungs: clear to auscultation bilaterally  Heart: regular rate and rhythm, S1, S2 normal, no murmur, click, rub or gallop  Abdomen: soft, non-tender; bowel sounds normal; no masses, no organomegaly  Neurologic: Grossly normal     Data Review :   CBC (with or without Differential):   Lab Results   Component Value Date    WBC 6.21 02/08/2017    HGB 15.3 02/08/2017    HCT 43.1 02/08/2017    MCV 93.1 02/08/2017    MCH 33.0 02/08/2017    MCHC 35.5 02/08/2017    RDW 13.1 02/08/2017    PLATCOUNT 101 (L) 02/08/2017    MPV 9.6 02/08/2017    NEUTROPCT 59.6 02/08/2017    MONOPCT 7.4 (H) 02/08/2017    EOSPCT 2.3 02/08/2017    BASOPCT 0.6 02/08/2017   , BMP/CMP:   Lab Results   Component Value Date    NA 145 02/08/2017    K 4.0 02/08/2017    CL 103 02/08/2017    CO2 25 02/08/2017    BUN 13 02/08/2017    CREATININE 1.05 02/08/2017    GLU 125 (H) 02/08/2017    CALCIUM 9.0 02/08/2017    TPROT 6.8 02/07/2017    ALB 4.4 02/07/2017    AST 28 02/07/2017

## 2017-02-08 NOTE — Progress Notes
Patient and spouse concerned as they have not been assessed by a physician since transfer from Emergency room. They state that the PA they spoke to in the er made it seem as though a procedure was emergent and they were concerned he was not receiving adequate medical care. Air mail message sent to Dr Lance MorinMuttevi to notify. Call placed.

## 2017-02-08 NOTE — Progress Notes
Shift 0700-1459 1500-2259 2300-0659 24 Hour Total 0700-1459 1500-2259 2300-0659 24 Hour Total   I  N  T  A  K  E   P.O.  240  240 120   120      P.O.  240  240 120   120    Shift Total  240  240 120   120   O  U  T  P  U  T   Urine              Urine Occurrence  1 x  1 x        Shift Total           NET  240  240 120   120       Physical Exam:  General: alert, cooperative, no distress  Chest: Lungs:clear to auscultation bilaterally  Cardiac: regular rate and rhythm, S1, S2 normal, no murmur, click, rub or gallop  Abdomen: soft, non-tender; bowel sounds normal; no masses,  no organomegaly  Extremities: no edema, redness or tenderness in the calves or thighs    Data Review:   Recent Labs      02/07/17   1048  02/08/17   0444   WBC  4.57  6.21   HGB  14.9  15.3   PLATCOUNT  106*  101*   MCV  95.3  93.1     Recent Labs      02/07/17   1048  02/08/17   0444   NA  139  145   K  4.1  4.0   BUN  13  13   CREATININE  1.08  1.05   GLU  212*  125*   CALCIUM  9.0  9.0     Recent Labs      02/07/17   1048   AST  28   ALT  33     No results for input(s): CKMB in the last 72 hours.    Invalid input(s): TROPONIN  No results for input(s): INR in the last 72 hours.       Assessment and Plan:       Pt with hx of hypertension, hyperlipidemia, diabetes mellitus type 2, gastroesophageal reflux disease, coronary disease status post CABG 2014 presented [facility] emergency room with complaints of chest pain. Had a CTA of the chest in the emergency room to rule out any pulmonary embolism, no PE noted on CTA but there is a concern of her penetrating ulcer in the aorta and so [facility] and cardiac thoracic surgery was consulted      ? Penetrating aortic ulcer, noted incidentally on CTA of the chest in the emergency room - discussed with cardiology thoracic surgery, will get CTA dissection protocol - plan for repair 02/09/2017 if ulcer confirmed

## 2017-02-08 NOTE — Progress Notes
?   Chest pain, atypical - ACS rule out with troponins and EKG - patient history suggest GI etiology of his chest pain, will keep on PPI and Maalox - patient reports he had a stress status in November 2018 by his primary cardiologist [Dr. Khan at [place]] which was reported as completely normal, case management consulted to get records regarding stress test results  ? Coronary artery disease status post CABG 2014 - continue aspirin and statin, beta blocker  ? Hypertension, controlled - continue on metoprolol, losartan  ? Hyperlipidemia - statin  ? Diet-controlled diabetes mellitus 2, insulin sliding scale as needed    DVT prophylaxis  full code      Riley Naga S Muttevi, MD  2:57 PM

## 2017-02-08 NOTE — Plan of Care
Problem: Pain, Acute & Chronic  Goal: Pain is relieved/acceptable level with minimal side effects    Intervention: Assess pain level  Complaint of chest discomfort, but refused pain medication. Repositioned self for comfort      Problem: Risk for Injury R/T Falls  Goal: Prevent falls  Fall precautions reinforced. Pt and family  Encouraged to call for assistance. Call bell within reach

## 2017-02-08 NOTE — Consults
ondansetron 4 mg Intravenous Q6H PRN   polyethylene glycol 17 g Oral BID PRN   traMADol 50 mg Oral Q6H PRN   traZODone 50 mg Oral Nightly PRN     Allergies:  Latex    Review of Systems:  Review of systems not obtained due to patient factors.    Objective:      Patient Vitals for the past 8 hrs:   BP Temp Temp src Pulse Resp SpO2   02/08/17 1153 120/72 36.4 ?C (97.5 ?F) Oral - 21 -   02/08/17 1050 116/65 - - - - -   02/08/17 0800 - - - - - 99 %   02/08/17 0734 105/76 36.7 ?C (98.1 ?F) Oral 72 18 -       Pain Score  Pain Rating ([facility] Only) : 0      Intake/Output Summary (Last 24 hours) at 02/08/17 1533  Last data filed at 02/08/17 1000   Gross per 24 hour   Intake              360 ml   Output                0 ml   Net              360 ml         Physical Exam:  BP 120/72  - Pulse 72  - Temp 36.4 ?C (97.5 ?F) (Oral)  - Resp 21  - Ht 1.829 m (6')  - Wt 96.2 kg (212 lb)  - SpO2 99%  - BMI 28.75 kg/m?   General appearance: alert, cooperative, no distress, appears stated age  Lungs: clear to auscultation bilaterally  Heart: regular rate and rhythm, S1, S2 normal, no murmur, click, rub or gallop  Abdomen: soft, non-tender; bowel sounds normal; no masses, no organomegaly  Neurologic: Grossly normal     Data Review :   CBC (with or without Differential):   Lab Results   Component Value Date    WBC 6.21 02/08/2017    HGB 15.3 02/08/2017    HCT 43.1 02/08/2017    MCV 93.1 02/08/2017    MCH 33.0 02/08/2017    MCHC 35.5 02/08/2017    RDW 13.1 02/08/2017    PLATCOUNT 101 (L) 02/08/2017    MPV 9.6 02/08/2017    NEUTROPCT 59.6 02/08/2017    MONOPCT 7.4 (H) 02/08/2017    EOSPCT 2.3 02/08/2017    BASOPCT 0.6 02/08/2017   , BMP/CMP:   Lab Results   Component Value Date    NA 145 02/08/2017    K 4.0 02/08/2017    CL 103 02/08/2017    CO2 25 02/08/2017    BUN 13 02/08/2017    CREATININE 1.05 02/08/2017    GLU 125 (H) 02/08/2017    CALCIUM 9.0 02/08/2017    TPROT 6.8 02/07/2017    ALB 4.4 02/07/2017    AST 28 02/07/2017

## 2017-02-08 NOTE — Progress Notes
On Feb 5th at 3:34 pm Care Coordinator faxed release of medical records. Request was received and medical records was faxed immediately back over and placed in  Patients chart.    Rolly PancakeJanice Evans, Care Coordinator

## 2017-02-08 NOTE — Plan of Care
Problem: Pain, Acute & Chronic  Goal: Pain is relieved/acceptable level with minimal side effects  Complaint of mid chest pain 3/10, refused medication. Repositioned self for comfort.

## 2017-02-08 NOTE — Progress Notes
Department of Medicine  General Internal Medicine  Hospitalist Service      Admit Date:02/07/2017  9:46 AM   LOS: 1 days    History of Present Illness:     In the past 24 hours Rayfield Sprick  has experienced no further chest pain but is frustrated that he has not seen the surgeon this morning.        REVIEW OF SYSTEMS:    All other systems reviewed and are negative except those stated above.     Current Hospital Medications:  Scheduled:   ? [START ON 02/09/2017] acetaminophen  1,000 mg Oral Once   ? allopurinol  100 mg Oral daily   ? aluminum-magnesium-simethicone hydroxide  30 mL Oral TID W/ Meals   ? aspirin  81 mg Oral daily   ? atorvastatin  40 mg Oral Nightly   ? [START ON 02/09/2017] gabapentin  600 mg Oral TID   ? insulin aspart  0-3 Units Subcutaneous TID AC   ? losartan  25 mg Oral daily   ? metoprolol tartrate  25 mg Oral BID   ? pantoprazole  40 mg Oral BID AC      ? bolus IV fluid       bolus IV fluid, acetaminophen, dextrose, glucose, ipratropium-albuterol, labetalol, ondansetron **OR** ondansetron, polyethylene glycol, traMADol, traZODone      Physical Exam:     Patient Vitals for the past 24 hrs:   BP Temp Temp src Pulse Resp SpO2 Height Weight   02/08/17 1153 120/72 36.4 ?C (97.5 ?F) Oral - 21 - - -   02/08/17 1050 116/65 - - - - - - -   02/08/17 0800 - - - - - 99 % - -   02/08/17 0734 105/76 36.7 ?C (98.1 ?F) Oral 72 18 - - -   02/08/17 0352 107/55 36.7 ?C (98.1 ?F) Oral 54 21 99 % - -   02/07/17 2319 107/57 36.9 ?C (98.5 ?F) Oral 51 20 100 % - -   02/07/17 2100 - - - - - - 1.829 m (6') 96.2 kg (212 lb)   02/07/17 1952 124/69 36.6 ?C (97.8 ?F) Oral 56 18 100 % - -   02/07/17 1837 139/70 36.6 ?C (97.9 ?F) Oral 57 18 100 % - -   02/07/17 1800 140/88 - - - - - - -   02/07/17 1758 - - - 51 17 100 % - -   02/07/17 1600 120/74 - - 56 17 100 % - -   02/07/17 1500 147/90 - - 63 19 100 % - -          Date 02/07/17 0700 - 02/08/17 [id number] 02/08/17 0700 - 02/09/17 [id number]

## 2017-02-08 NOTE — Consults
Adrenal Glands:  The adrenal glands are normal in appearance.    Bowel: The regional bowel is normal in appearance.  No evidence of bowel obstruction is seen.  ?  Osseous structures: Views of the regional skeleton demonstrate it to be normal in appearance.  ?  IMPRESSION:  Impression:  ?  Redemonstration of focal outpouching at the proximal lateral descending aorta likely   representing a penetrating ulcer. Once again focal aneurysm remains in the differential, though   less likely.  ?  Mild ectasia of the abdominal aorta measuring up to 3.3 cm.  ?  No significant stenosis or focal occlusions noted throughout the aortoiliac or proximal femoral   vasculature.     Assessment & Plan:      72YOM with incidental findings of a penetrating ulcer in the descending aorta distal to the left subclavian artery. This is not the cause of the patient's current synptoms but is a semiurgent condition that intervention is recommended.  Plan TEVAR, short covered stent, tomorrow.  We had long discussion with the patient and his wife informing them together that cardiovascular and thoracic surgery is high risk and highly complex. The surgery may result in complications. We discussed alternative options and the risks and benefits of each. We informed the patient that the disease process has been present for a long time and may result in poor outcome.  We may find at the time of surgery that other necessary procedures should be performed. They asked appropriate questions and agreed to proceed with surgery and all indicated procedures.    Risks explained including but not limited to bleeding, MI, infection, CVA, DVT, PE, resp failure, renal failure, heart failure, valve failure, need for anticoagulation, early graft closure, heart arrhythmias, need for pacemaker, nerve injury,  repeat surgery and death.      Riley Evans  02/08/2017  3:33 PM

## 2017-02-08 NOTE — Plan of Care
Problem: Pain, Acute & Chronic  Goal: Pain is relieved/acceptable level with minimal side effects  Complaint of mid chest pain 3/10, refused medication. Repositioned self for comfort.

## 2017-02-08 NOTE — Progress Notes
Department of Medicine  General Internal Medicine  Hospitalist Service      Admit Date:02/07/2017  9:46 AM   LOS: 1 days    History of Present Illness:     In the past 24 hours Riley Evans  has experienced no further chest pain but is frustrated that he has not seen the surgeon this morning.        REVIEW OF SYSTEMS:    All other systems reviewed and are negative except those stated above.     Current Hospital Medications:  Scheduled:   ? [START ON 02/09/2017] acetaminophen  1,000 mg Oral Once   ? allopurinol  100 mg Oral daily   ? aluminum-magnesium-simethicone hydroxide  30 mL Oral TID W/ Meals   ? aspirin  81 mg Oral daily   ? atorvastatin  40 mg Oral Nightly   ? [START ON 02/09/2017] gabapentin  600 mg Oral TID   ? insulin aspart  0-3 Units Subcutaneous TID AC   ? losartan  25 mg Oral daily   ? metoprolol tartrate  25 mg Oral BID   ? pantoprazole  40 mg Oral BID AC      ? bolus IV fluid       bolus IV fluid, acetaminophen, dextrose, glucose, ipratropium-albuterol, labetalol, ondansetron **OR** ondansetron, polyethylene glycol, traMADol, traZODone      Physical Exam:     Patient Vitals for the past 24 hrs:   BP Temp Temp src Pulse Resp SpO2 Height Weight   02/08/17 1153 120/72 36.4 ?C (97.5 ?F) Oral - 21 - - -   02/08/17 1050 116/65 - - - - - - -   02/08/17 0800 - - - - - 99 % - -   02/08/17 0734 105/76 36.7 ?C (98.1 ?F) Oral 72 18 - - -   02/08/17 0352 107/55 36.7 ?C (98.1 ?F) Oral 54 21 99 % - -   02/07/17 2319 107/57 36.9 ?C (98.5 ?F) Oral 51 20 100 % - -   02/07/17 2100 - - - - - - 1.829 m (6') 96.2 kg (212 lb)   02/07/17 1952 124/69 36.6 ?C (97.8 ?F) Oral 56 18 100 % - -   02/07/17 1837 139/70 36.6 ?C (97.9 ?F) Oral 57 18 100 % - -   02/07/17 1800 140/88 - - - - - - -   02/07/17 1758 - - - 51 17 100 % - -   02/07/17 1600 120/74 - - 56 17 100 % - -   02/07/17 1500 147/90 - - 63 19 100 % - -          Date 02/07/17 0700 - 02/08/17 0659 02/08/17 0700 - 02/09/17 0659

## 2017-02-08 NOTE — Progress Notes
On Feb 5th at 3:34 pm Care Coordinator faxed release of medical records. Request was received and medical records was faxed immediately back over and placed in  Patients chart.    Janice Davis, Care Coordinator

## 2017-02-08 NOTE — Progress Notes
Department of Case Management  Discharge Planning Progress Note       NAME: Riley Evans    MRN: [id number]    AGE: 72 y.o. DOB: 28-Apr-1945 Date of Admission: 02/07/2017    Payor: Payor: MEDICARE AETNA / Plan: MEDICARE AETNA HMO / Product Type: HMO/POS /     Advance Directive/Healthcare Decision Maker: Advance Directive/Healthcare Decision Maker:  (refused) (02/07/17 1618)  Legal Documentation:      Current Medical Status: Resting    The patient is currently: Inpatient    Case Manager Assessment/Actions Taken: Transferred to this CM census today. [facility]. Received consult for medical records. Consult sent to CC to obtain records.     Potential barriers and planned intervention: None    Interdisciplinary updates: None    Anticipated discharge plan: Home to [place]    Expected discharge date: TBD    Olam IdlerMary W Sexton, RN    02/08/2017 12:17 PM

## 2017-02-08 NOTE — Progress Notes
Patient and spouse concerned as they have not been assessed by a physician since transfer from Emergency room. They state that the PA they spoke to in the er made it seem as though a procedure was emergent and they were concerned he was not receiving adequate medical care. Air mail message sent to Dr Muttevi to notify. Call placed.

## 2017-02-09 DIAGNOSIS — Z951 Presence of aortocoronary bypass graft: Secondary | ICD-10-CM

## 2017-02-09 DIAGNOSIS — I10 Essential (primary) hypertension: Secondary | ICD-10-CM

## 2017-02-09 DIAGNOSIS — E785 Hyperlipidemia, unspecified: Secondary | ICD-10-CM

## 2017-02-09 DIAGNOSIS — E119 Type 2 diabetes mellitus without complications: Secondary | ICD-10-CM

## 2017-02-09 DIAGNOSIS — Z87891 Personal history of nicotine dependence: Secondary | ICD-10-CM

## 2017-02-09 DIAGNOSIS — K219 Gastro-esophageal reflux disease without esophagitis: Principal | ICD-10-CM

## 2017-02-09 DIAGNOSIS — I2510 Atherosclerotic heart disease of native coronary artery without angina pectoris: Secondary | ICD-10-CM

## 2017-02-09 DIAGNOSIS — Z7982 Long term (current) use of aspirin: Secondary | ICD-10-CM

## 2017-02-09 DIAGNOSIS — I712 Thoracic aortic aneurysm, without rupture: Principal | ICD-10-CM

## 2017-02-09 DIAGNOSIS — Z79899 Other long term (current) drug therapy: Secondary | ICD-10-CM

## 2017-02-09 DIAGNOSIS — Z7984 Long term (current) use of oral hypoglycemic drugs: Secondary | ICD-10-CM

## 2017-02-09 MED ORDER — CYCLOBENZAPRINE HCL 5 MG PO TABS
5 mg | Freq: Three times a day (TID) | ORAL | Status: DC
Start: 2017-02-09 — End: 2017-02-12

## 2017-02-09 MED ORDER — BISACODYL 5 MG PO TBEC
10 mg | Freq: Every day | ORAL | Status: DC | PRN
Start: 2017-02-09 — End: 2017-02-12

## 2017-02-09 MED ORDER — VASOPRESSIN INFUSION JX
.02 [IU]/min | INTRAVENOUS | Status: DC
Start: 2017-02-09 — End: 2017-02-11

## 2017-02-09 MED ORDER — HEPARIN SODIUM (PORCINE) 5000 UNIT/ML IJ SOLN
5000 [IU] | Freq: Two times a day (BID) | SUBCUTANEOUS | Status: DC
Start: 2017-02-09 — End: 2017-02-12

## 2017-02-09 MED ORDER — IODIXANOL 320 MG/ML IV SOLN
300 mL | Freq: Once | INTRA_ARTERIAL | Status: CP
Start: 2017-02-09 — End: ?

## 2017-02-09 MED ORDER — ONDANSETRON HCL 4 MG/2ML IJ SOLN
4 mg | Freq: Four times a day (QID) | INTRAVENOUS | Status: DC | PRN
Start: 2017-02-09 — End: 2017-02-12

## 2017-02-09 MED ORDER — OXYCODONE HCL 5 MG PO TABS
5 mg | ORAL | Status: DC | PRN
Start: 2017-02-09 — End: 2017-02-12

## 2017-02-09 MED ORDER — SODIUM CHLORIDE 0.9% FOR FLUSHES
20-180 mL | Status: CP | PRN
Start: 2017-02-09 — End: ?

## 2017-02-09 MED ORDER — ACETAMINOPHEN 500 MG PO TABS
1000 mg | Freq: Four times a day (QID) | ORAL | Status: DC
Start: 2017-02-09 — End: 2017-02-12

## 2017-02-09 MED ORDER — ENALAPRILAT 1.25 MG/ML IV INJ 1 ML UD
0.625 mg | Freq: Four times a day (QID) | INTRAVENOUS | Status: DC | PRN
Start: 2017-02-09 — End: 2017-02-11

## 2017-02-09 MED ORDER — HALOPERIDOL LACTATE 5 MG/ML IJ SOLN
.5 mg | INTRAVENOUS | Status: DC | PRN
Start: 2017-02-09 — End: 2017-02-10

## 2017-02-09 MED ORDER — PHENYLEPHRINE 10MG/250ML NS ANES ONLY JX
Status: DC | PRN
Start: 2017-02-09 — End: 2017-02-10

## 2017-02-09 MED ORDER — NITROGLYCERIN IN D5W 200-5 MCG/ML-% IV SOLN
0-3 ug/kg/min | INTRAVENOUS | Status: DC
Start: 2017-02-09 — End: 2017-02-12

## 2017-02-09 MED ORDER — NALBUPHINE HCL 10 MG/ML IJ SOLN
2.5 mg | INTRAVENOUS | Status: DC | PRN
Start: 2017-02-09 — End: 2017-02-10

## 2017-02-09 MED ORDER — HEPARIN (PORCINE) IN NACL 2-0.9 UNIT/ML-% IJ SOLN
INTRA_ARTERIAL | Status: CP | PRN
Start: 2017-02-09 — End: ?

## 2017-02-09 MED ORDER — LIDOCAINE HCL (PF) 1 % IJ SOLN SH
INTRAVENOUS | Status: DC | PRN
Start: 2017-02-09 — End: 2017-02-10

## 2017-02-09 MED ORDER — NALOXONE HCL 0.4 MG/ML IJ SOLN
.4 mg | INTRAVENOUS | Status: DC | PRN
Start: 2017-02-09 — End: 2017-02-10

## 2017-02-09 MED ORDER — FENTANYL CITRATE INJ 50 MCG/ML CUSTOM AMP/VIAL
Status: DC | PRN
Start: 2017-02-09 — End: 2017-02-10

## 2017-02-09 MED ORDER — FENTANYL CITRATE INJ 50 MCG/ML CUSTOM AMP/VIAL
25 ug | INTRAVENOUS | Status: DC | PRN
Start: 2017-02-09 — End: 2017-02-12

## 2017-02-09 MED ORDER — SODIUM CHLORIDE 0.9 % IV SOLN
Status: DC | PRN
Start: 2017-02-09 — End: 2017-02-10

## 2017-02-09 MED ORDER — CEFAZOLIN SODIUM 1 G IJ SOLR
2 g | Freq: Once | INTRAVENOUS | Status: DC
Start: 2017-02-09 — End: 2017-02-10

## 2017-02-09 MED ORDER — PHENYLEPHRINE HCL-NACL 0.1-0.9 MG/ML-% IV SOLN SH
Status: DC | PRN
Start: 2017-02-09 — End: 2017-02-10

## 2017-02-09 MED ORDER — PROPOFOL 10 MG/ML IV EMUL CUSTOM SH
Status: DC | PRN
Start: 2017-02-09 — End: 2017-02-10

## 2017-02-09 MED ORDER — BOLUS IV FLUID JX
Freq: Once | INTRAVENOUS | Status: CP
Start: 2017-02-09 — End: ?

## 2017-02-09 MED ORDER — HEPARIN SODIUM (PORCINE) 1000 UNIT/ML IJ SOLN
Status: DC | PRN
Start: 2017-02-09 — End: 2017-02-10

## 2017-02-09 MED ORDER — HYDROMORPHONE HCL 1 MG/ML IJ SOLN
.5 mg | INTRAVENOUS | Status: DC | PRN
Start: 2017-02-09 — End: 2017-02-10

## 2017-02-09 MED ORDER — DEXAMETHASONE SODIUM PHOSPHATE 4 MG/ML CUSTOM COMPONENT JX
Status: DC | PRN
Start: 2017-02-09 — End: 2017-02-10

## 2017-02-09 MED ORDER — NICARDIPINE INFUSION JX
2.5-15 mg/h | INTRAVENOUS | Status: DC | PRN
Start: 2017-02-09 — End: 2017-02-10

## 2017-02-09 MED ORDER — LABETALOL HCL 5 MG/ML IV SOLN SH
10 mg | INTRAVENOUS | Status: DC | PRN
Start: 2017-02-09 — End: 2017-02-10

## 2017-02-09 MED ORDER — MELATONIN 3 MG PO TABS
6 mg | Freq: Every evening | ORAL | Status: DC
Start: 2017-02-09 — End: 2017-02-12

## 2017-02-09 MED ORDER — SUCCINYLCHOLINE CHLORIDE 140 MG/7ML IV SOSY
Status: DC | PRN
Start: 2017-02-09 — End: 2017-02-10

## 2017-02-09 MED ORDER — CEFAZOLIN 1 G IV MBP
1 g | Freq: Three times a day (TID) | INTRAVENOUS | Status: CP
Start: 2017-02-09 — End: ?

## 2017-02-09 MED ORDER — ROCURONIUM BROMIDE 50 MG/5ML IV SOSY
Status: DC | PRN
Start: 2017-02-09 — End: 2017-02-10

## 2017-02-09 MED ORDER — SUGAMMADEX SODIUM 200 MG/2ML IV SOLN
Status: DC | PRN
Start: 2017-02-09 — End: 2017-02-10

## 2017-02-09 MED ORDER — SODIUM CHLORIDE 0.9 % IV SOLN
1000 mL | INTRAVENOUS | Status: DC
Start: 2017-02-09 — End: 2017-02-12

## 2017-02-09 MED ORDER — ONDANSETRON HCL 4 MG/2ML IJ SOLN
Status: DC | PRN
Start: 2017-02-09 — End: 2017-02-10

## 2017-02-09 MED ORDER — LACTATED RINGERS IV SOLN
1000 mL | Freq: Once | INTRAVENOUS | Status: CN
Start: 2017-02-09 — End: ?

## 2017-02-09 MED ORDER — LACTATED RINGERS IV SOLN
Status: DC | PRN
Start: 2017-02-09 — End: 2017-02-10

## 2017-02-09 MED ORDER — LIDOCAINE HCL (PF) 2 % IJ SOLN
Freq: Once | SUBCUTANEOUS | Status: CP
Start: 2017-02-09 — End: ?

## 2017-02-09 MED ORDER — BUPIVACAINE HCL (PF) 0.5 % IJ SOLN
Status: DC | PRN
Start: 2017-02-09 — End: 2017-02-10

## 2017-02-09 MED ORDER — OXYCODONE HCL 5 MG PO TABS
10 mg | ORAL | Status: DC | PRN
Start: 2017-02-09 — End: 2017-02-10

## 2017-02-09 MED ORDER — PROMETHAZINE HCL 25 MG/ML IJ SOLN
6.25 mg | INTRAVENOUS | Status: DC | PRN
Start: 2017-02-09 — End: 2017-02-10

## 2017-02-09 NOTE — Progress Notes
Department of Case Management  Discharge Planning Progress Note       NAME: Riley Evans    MRN: [id number]    AGE: 72 y.o. DOB: 10-Apr-1945 Date of Admission: 02/07/2017    Payor: Payor: MEDICARE AETNA / Plan: MEDICARE AETNA HMO / Product Type: HMO/POS /     Advance Directive/Healthcare Decision Maker: Advance Directive/Healthcare Decision Maker:  (refused) (02/07/17 1618)  Legal Documentation:  None on file    Current Medical Status: To OR    The patient is currently: inpatient    Case Manager Assessment/Actions Taken: New to this CM today. To angio suite today jointly with IR and CTS for thoracic stent graft placement with femoral cut down for penetrating atherosclerotic ulcer of the thoracic aorta.     Potential barriers and planned intervention: None    Interdisciplinary updates: CTS following    Anticipated discharge plan[place]( [place]    Expected discharge date: TBD    Lucretia RoersMichelle L Snype, RN    02/09/2017 12:33 PM

## 2017-02-09 NOTE — Plan of Care
Problem: Pain, Acute & Chronic  Goal: Pain is relieved/acceptable level with minimal side effects  Denies any pain at this moment. OOB on chair no distress noted.

## 2017-02-09 NOTE — Anesthesia Preprocedure Evaluation
Department of Anesthesiology  Pre-operative Evaluation Record    Patient Name: Riley Evans                  Sex: male                  Age: 72 y.o.                  MRN: [id number]     Allergies:   Latex    Current Med List   Medication Sig   ? allopurinol (ZYLOPRIM) 100 MG PO Tablet by mouth.   ? aspirin 81 MG PO Tablet Take by mouth daily.   ? atorvastatin (LIPITOR) 40 MG PO Tablet by mouth.   ? glyBURIDE micronized (GLYNASE) 3 MG PO Tablet Take 1.5 mg by mouth 2 times daily (before breakfast and dinner).   ? losartan (COZAAR) 25 MG PO Tablet Take by mouth daily.   ? metoprolol tartrate (LOPRESSOR) 25 MG PO Tablet Take by mouth 2 times daily.   ? Misc Natural Products (GLUCOSAMINE CHOND COMPLEX/MSM PO) by mouth.   ? niacin 500 MG PO Tablet Take by mouth daily (with breakfast).   ? omeprazole (PriLOSEC) 40 MG PO Capsule Delayed Release Take by mouth daily.       Prior Surgeries:     Past Surgical History:   Procedure Laterality Date   ? APPENDECTOMY     ? CARDIAC SURGERY         Social   ETOH:       Alcohol Use No     Tobacco:   History   Smoking Status   ? Former Smoker   ? Types: Cigarettes   ? Quit date: 2003   Smokeless Tobacco   ? Never Used     Drugs:   History   Drug Use No       System Evaluation and Medical History:   No outstanding issues   HPI: 72 y.o M pmhx, CAD (s/-p CABG), DM, GERD presents for TEVAR under GETA   Anesthetic Complications: Negative for anesthesia history ROS except otherwise noted.   Pulmonary: Negative for pulmonary ROS except otherwise noted.   Neuro/Psych: Negative for neuro/psych ROS except otherwise noted.   Cardiovascular: Cardiac history includes: hypertension. The hypertension is well controlled. The hypertension has occurred for > 10 years.   GI/Hepatic: acid reflux. GERD controlled with medication. The GERD occurs infrequently.   Renal: Negative for renal ROS except otherwise noted   Endo/Other: diabetes. The patient has type 2 diabetes. The diabetes is

## 2017-02-09 NOTE — Anesthesia Procedure Notes
Arterial Line  Location: SpecialsStart Time: 02/09/2017 3:16 PM  Indication: need for blood sampling and continuous blood pressure monitoring  Sedation Type: general anesthesia    Staff Involved in Procedure  Attending: Eliberto Ivory  CRNA: Mcarthur Rossetti S  Performed By: CRNA  Preprocedure Checklist: patient identified, IV checked, risks and benefits discussed, monitors and equipment checked, anesthesia consent present, allergies verified and contraindications to procedure verified    The patient's right radial artery area was prepped with chloraprep and draped in a sterile fashion. Sterile precautions included the use of hand hygiene performed prior to catheter insertion and, arterial bundle kit and sterile gloves throughout the procedure. A 4.45 cm 20 G Arrow catheter was used to enter the artery. The catheter was placed using Seldinger technique over a wire. The number of attempts was 1.  Catheter secured using tegaderm dressing. The estimated blood loss was 52m. Procedure tolerated well by the patient. Vital Signs Stable. Patient Comfortable.

## 2017-02-09 NOTE — Progress Notes
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 Chest pain, atypical - ACS rule out with troponins and EKG - patient history suggest GI etiology of his chest pain, will keep on PPI and Maalox - records from his primary [facility] [place] reviewed 2.6.19 and shows equivocal MPI 6/18 and pt referred for CT coronaries, pt do not remember completing it - encouraged to fu with his cards as an OP after dc   ? Coronary artery disease status post CABG 2014 - continue aspirin and statin, beta blocker  ? Hypertension, controlled - continue on metoprolol, losartan  ? Hyperlipidemia - statin  ? Diet-controlled diabetes mellitus 2, insulin sliding scale as needed    DVT prophylaxis  full code      Dhanvantri Burney GauzeNaga S Muttevi, MD  6:05 PM

## 2017-02-09 NOTE — Plan of Care
Problem: Pain, Acute & Chronic  Goal: Pain is relieved/acceptable level with minimal side effects  Outcome: Ongoing  Patient being assessed frequently for pain.  Medicated as needed per MAR.

## 2017-02-09 NOTE — Progress Notes
02/09/2017 10:32am Care Coordinator services are not needed for this patient at this time. [place] is [place] and will be returning home for PCP follow up.  Kamesha Anderson  Care Coordinator

## 2017-02-09 NOTE — Plan of Care
Problem: Pain, Acute & Chronic  Goal: Pain is relieved/acceptable level with minimal side effects  Denies any pain at this moment. OOB on chair no distress noted.

## 2017-02-09 NOTE — Progress Notes
Volume (mL) (LR infusion)     200  200      Volume (mL) (0.9 % NaCl infusion)     800  800    Shift Total   120 20 1000  1020   O  U  T  P  U  T   Urine     550  550      Urine     250  250      Output (mL) (Urethral Catheter 02/09/17)     300  300    Blood     300  300      EBL (mL)     300  300    Shift Total     850  850   NET   120 20 150  170       Physical Exam:  General: alert, cooperative, no distress  Chest: Lungs:clear to auscultation bilaterally  Cardiac: regular rate and rhythm, S1, S2 normal, no murmur, click, rub or gallop  Abdomen: soft, non-tender; bowel sounds normal; no masses,  no organomegaly  Extremities: no edema, redness or tenderness in the calves or thighs    Data Review:   Recent Labs      02/07/17   1048  02/08/17   0444  02/09/17   0513   WBC  4.57  6.21  6.72   HGB  14.9  15.3  15.2   PLATCOUNT  106*  101*  115*   MCV  95.3  93.1  94.9     Recent Labs      02/07/17   1048  02/08/17   0444  02/09/17   0513   NA  139  145  138   K  4.1  4.0  4.2   BUN  13  13  19   CREATININE  1.08  1.05  1.15   GLU  212*  125*  136*   CALCIUM  9.0  9.0  9.0     Recent Labs      02/07/17   1048  02/09/17   0513   AST  28  26   ALT  33  32     No results for input(s): CKMB in the last 72 hours.    Invalid input(s): TROPONIN  Recent Labs      02/09/17   0513   INR  1.1          Assessment and Plan:       Pt with hx of hypertension, hyperlipidemia, diabetes mellitus type 2, gastroesophageal reflux disease, coronary disease status post CABG 2014 presented to the [facility] emergency room with complaints of chest pain. Had a CTA of the chest in the emergency room to rule out any pulmonary embolism, no PE noted on CTA but there is a concern of her penetrating ulcer in the aorta and so [facility] and cardiac thoracic surgery was consulted      ? Penetrating aortic ulcer, noted incidentally on CTA of the chest in the

## 2017-02-09 NOTE — Plan of Care
Problem: Airway  Goal: The patient's respiratory status is within normal limits according to ASPAN standards    Intervention: Uses appropriate equipment to monitor respiratory status  Rn to monitor pt. resp status throughout procedure.         Problem: Safety  Goal: To promote quality healthcare in a safe perianesthesia care setting while preventing injury,harm and reduce the risk of healthcare associated infections.    Intervention: Identifies the patient using 2 identifiers  Verify pt. Name and dob via armband and verbally prior to procedure.      Problem: Knowledge Deficit  Goal: To obtain pain management as quickly as possible, to reduce the patient's pain to a tolerable level, and reduce pre-op anxiety.    Intervention: Have Resident/MD explain procedure prior to surgery  MD to explain risk and benefits of procedure prior to consent.      Problem: Pain Management  Goal: To obtain pain management as quickly as possible, to reduce the patient's pain to a tolerable level, and reduce pre-op anxiety.    Intervention: Assess for pain frequently.  Rn will monitor pt. Pain level throughout procedure and address as needed.

## 2017-02-09 NOTE — Progress Notes
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 Chest pain, atypical - ACS rule out with troponins and EKG - patient history suggest GI etiology of his chest pain, will keep on PPI and Maalox - records from his primary cardiologist [Dr. Khan at [facility] [place]] reviewed 2.6.19 and shows equivocal MPI 6/18 and pt referred for CT coronaries, pt do not remember completing it - encouraged to fu with his cards as an OP after dc   ? Coronary artery disease status post CABG 2014 - continue aspirin and statin, beta blocker  ? Hypertension, controlled - continue on metoprolol, losartan  ? Hyperlipidemia - statin  ? Diet-controlled diabetes mellitus 2, insulin sliding scale as needed    DVT prophylaxis  full code      Dhanvantri Naga S Muttevi, MD  6:05 PM

## 2017-02-09 NOTE — Progress Notes
Volume (mL) (LR infusion)     200  200      Volume (mL) (0.9 % NaCl infusion)     800  800    Shift Total   120 20 1000  1020   O  U  T  P  U  T   Urine     550  550      Urine     250  250      Output (mL) (Urethral Catheter 02/09/17)     300  300    Blood     300  300      EBL (mL)     300  300    Shift Total     850  850   NET   120 20 150  170       Physical Exam:  General: alert, cooperative, no distress  Chest: Lungs:clear to auscultation bilaterally  Cardiac: regular rate and rhythm, S1, S2 normal, no murmur, click, rub or gallop  Abdomen: soft, non-tender; bowel sounds normal; no masses,  no organomegaly  Extremities: no edema, redness or tenderness in the calves or thighs    Data Review:   Recent Labs      02/07/17   1048  02/08/17   0444  02/09/17   0513   WBC  4.57  6.21  6.72   HGB  14.9  15.3  15.2   PLATCOUNT  106*  101*  115*   MCV  95.3  93.1  94.9     Recent Labs      02/07/17   1048  02/08/17   0444  02/09/17   0513   NA  139  145  138   K  4.1  4.0  4.2   BUN  13  13  19    CREATININE  1.08  1.05  1.15   GLU  212*  125*  136*   CALCIUM  9.0  9.0  9.0     Recent Labs      02/07/17   1048  02/09/17   0513   AST  28  26   ALT  33  32     No results for input(s): CKMB in the last 72 hours.    Invalid input(s): TROPONIN  Recent Labs      02/09/17   0513   INR  1.1          Assessment and Plan:       Pt with hx of hypertension, hyperlipidemia, diabetes mellitus type 2, gastroesophageal reflux disease, coronary disease status post CABG 2014 [facility] with complaints of chest pain. Had a CTA of the chest in the emergency room to rule out any pulmonary embolism, no PE noted on CTA but there is a concern of her penetrating ulcer in the aorta and so [facility] and cardiac thoracic surgery was consulted      ? Penetrating aortic ulcer, noted incidentally on CTA of the chest in the

## 2017-02-09 NOTE — Progress Notes
Patient arrived to room 530 at 1710 with IR team at bedside. Patient arrived on room air, a line in right radial. Foley to gravity.PA McConnell stated to keep SBP between 130-170. Will continue to closely monitor.

## 2017-02-09 NOTE — Op Note
DEPARTMENT OF RADIOLOGY   RADIOLOGY POST PROCEDURE PROGRESS NOTE     Practitioners Present: Daniel Siragusa, MD and Michael Shabandi, MD    Procedure: TAG placement    Surgical Site:   1. Right groin cutdown   2. Left groin percutaneous access  3. Descending thoracic aorta graft placement    Contrast Administered: Omnipaque, mL Administered: 110, Concentration: 300    Medications Given During Procedure: General Anesthesia     Complications: None    Estimated Blood Loss: 50 mL    Specimens Obtained: None    Preliminary Report: Successful Thoracic Aortic Graft (TAG) placement with exclusion of penetrating ulcer of the proximal descending aorta. Two 34 mm x 10 cm overlapping grafts placed with good result and preservation of the left subclavian artery. The patient remained stable during the entire procedure.    Naudare M Shabandi  02/09/2017 4:55 PM

## 2017-02-09 NOTE — Progress Notes
Patient arrived to room 530 at 1710 with IR team at bedside. Patient arrived on room air, a line in right radial. Foley to gravity.PA Charlton HawsMcConnell stated to keep SBP between 130-170. Will continue to closely monitor.

## 2017-02-09 NOTE — Anesthesia Preprocedure Evaluation
well controlled. The age of onset of the diabetes was > 72 years of age. The patient uses Oral hypoglycemics to control the diabetes.       Physical Exam:   Airway: Physical exam of airway reveals, mouth opening of >3 FB with a TM distance of  > 6 cm with full neck ROM, Mallampati score of II.   Pulmonary:  On pulmonary examination breath sounds clear to auscultation.   Cardiovascular: On cardiovascular examination cardiovascular exam normal.   GCS: Patients total GCS score is 15, with 4 for eye response, with 5 for verbal response, and with 6 for motor response.  Obesity: obesity.    Dental: Upon examination dental exam normal  Other Findings: Pt has upper and lower partials in during exam      Risk Assessment:        PONV Score: 39%    Revised Cardiac Risk Index: Class 3 -  6.6% Risk        Communications:      Anesthesia informed consent obtained  Hold Antihypertensives: No  Hold Anticoagulation: N/A  Pacemaker Interrogation N/A  Outstanding Issues:none        Anesthesia Plan:       Patient has an ASA score of 3 and has been NPO for 6-8 hours. Plan for anesthesia technique is general.  With an intravenous type of induction. Anesthetic plan and risks has been discussed with the patient. The vascular access plan include the use of peripheral. The following pulse oximetry, body tempature, ETC02, NIBP, 6-lead EKG and arterial line will be used for intraoperative monitoring.   Transfusion plan has been discussed with the patient. Blood type and screen requested. Post operative pain will be controled by IV meds and oral meds.       Anesthesia Attending:   Airway: Physical exam of airway reveals, mouth opening of  >3 FB, with a TM distance of  > 6 cm, with full neck ROM, Mallampati score II.       Pulmonary: On pulmonary examination breath sounds clear to auscultation.   Cardiovascular: Negative for cardio exam except otherwise noted.   GCS: Patients total GCS score is 15, with 4 for eye response, with 5 for

## 2017-02-09 NOTE — Progress Notes
Department of Case Management  Discharge Planning Progress Note       NAME: Riley Evans    MRN: [id number]    AGE: 72 y.o. DOB: 05/11/1945 Date of Admission: 02/07/2017    Payor: Payor: MEDICARE AETNA / Plan: MEDICARE AETNA HMO / Product Type: HMO/POS /     Advance Directive/Healthcare Decision Maker: Advance Directive/Healthcare Decision Maker:  (refused) (02/07/17 1618)  Legal Documentation:  None on file    Current Medical Status: To OR    The patient is currently: inpatient    Case Manager Assessment/Actions Taken: New to this CM today. To angio suite today jointly with IR and CTS for thoracic stent graft placement with femoral cut down for penetrating atherosclerotic ulcer of the thoracic aorta.     Potential barriers and planned intervention: None    Interdisciplinary updates: CTS following    Anticipated discharge plan: Home( Lives in [place])    Expected discharge date: TBD    Michelle L Snype, RN    02/09/2017 12:33 PM

## 2017-02-09 NOTE — Progress Notes
CTS Pre-op Note:  - to angio suite today jointly with IR for thoracic stent graft placement w/ femoral cut down for penetrating atherosclerotic ulcer of the thoracic aorta  - operative and blood consents obtained and placed on chart  - the risks of the procedure, including paralysis, as well as the risks of not doing the procedure, were discussed at length with the patient yesterday and additional questions answered today  - preoperative labs wnl  - was NPO after midnight  - T&S, crossmatched for 2 units  - CTS to follow post-op    DANIEL BRAGA MD  PGY-1 Surgery  [phone]

## 2017-02-09 NOTE — Anesthesia Preprocedure Evaluation
Department of Anesthesiology  Pre-operative Evaluation Record    Patient Name: Mable Parisustaquio Oriordan                  Sex: male                  Age: 72 y.o.                  MRN: [id number]     Allergies:   Latex    Current Med List   Medication Sig   ? allopurinol (ZYLOPRIM) 100 MG PO Tablet by mouth.   ? aspirin 81 MG PO Tablet Take by mouth daily.   ? atorvastatin (LIPITOR) 40 MG PO Tablet by mouth.   ? glyBURIDE micronized (GLYNASE) 3 MG PO Tablet Take 1.5 mg by mouth 2 times daily (before breakfast and dinner).   ? losartan (COZAAR) 25 MG PO Tablet Take by mouth daily.   ? metoprolol tartrate (LOPRESSOR) 25 MG PO Tablet Take by mouth 2 times daily.   ? Misc Natural Products (GLUCOSAMINE CHOND COMPLEX/MSM PO) by mouth.   ? niacin 500 MG PO Tablet Take by mouth daily (with breakfast).   ? omeprazole (PriLOSEC) 40 MG PO Capsule Delayed Release Take by mouth daily.       Prior Surgeries:     Past Surgical History:   Procedure Laterality Date   ? APPENDECTOMY     ? CARDIAC SURGERY         Social   ETOH:       Alcohol Use No     Tobacco:   History   Smoking Status   ? Former Smoker   ? Types: Cigarettes   ? Quit date: 2003   Smokeless Tobacco   ? Never Used     Drugs:   History   Drug Use No       System Evaluation and Medical History:        Physical Exam:        Risk Assessment:        Communications:        Anesthesia Plan:            Anesthesia Attending:

## 2017-02-09 NOTE — Progress Notes
Department of Medicine  General Internal Medicine  Hospitalist Service      Admit Date:02/07/2017  9:46 AM   LOS: 2 days    History of Present Illness:       Denies any new issues / awaiting surgical intervention this AM         REVIEW OF SYSTEMS:    All other systems reviewed and are negative except those stated above.     Current Hospital Medications:  Scheduled:   ? acetaminophen  1,000 mg Oral Q6H   ? allopurinol  100 mg Oral daily   ? aluminum-magnesium-simethicone hydroxide  30 mL Oral TID W/ Meals   ? aspirin  81 mg Oral daily   ? atorvastatin  40 mg Oral Nightly   ? ceFAZolin IV mini-bag plus 1G  1 g Intravenous Q8H   ? cyclobenzaprine  5 mg Oral TID   ? gabapentin  600 mg Oral TID   ? heparin  5,000 Units Subcutaneous Q12H [facility]   ? insulin aspart  0-3 Units Subcutaneous TID AC   ? lidocaine   Subcutaneous Once   ? losartan  25 mg Oral daily   ? melatonin  6 mg Oral Nightly   ? pantoprazole  40 mg Oral BID AC      ? 0.9 % NaCl     ? nitroGLYCERIN       heparin (porcine), 0.9% NaCl flush, acetaminophen, bisacodyl, dextrose, enalaprilat, fentaNYL, glucose, ipratropium-albuterol, ondansetron, ondansetron **OR** [DISCONTINUED] ondansetron, oxyCODONE, polyethylene glycol, traZODone      Physical Exam:     Patient Vitals for the past 24 hrs:   BP Temp Temp src Pulse Resp SpO2   02/09/17 1743 124/64 - - - 14 99 %   02/09/17 1220 106/56 - - 53 13 95 %   02/09/17 1200 - - - 57 11 96 %   02/09/17 1100 - 37.1 ?C (98.7 ?F) - - - -   02/09/17 16100837 116/62 - - - - -   02/09/17 0800 - 36.4 ?C (97.6 ?F) - - - -   02/09/17 0400 - 36.7 ?C (98.1 ?F) Oral - - -   02/09/17 0000 - 36.9 ?C (98.4 ?F) Oral - - -   02/08/17 1922 115/64 36.6 ?C (97.9 ?F) Oral 92 18 -          Date 02/08/17 1500 - 02/09/17 0659 02/09/17 0700 - 02/10/17 0659   Shift 1500-2259 2300-0659 24 Hour Total 0700-1459 1500-2259 2300-0659 24 Hour Total   I  N  T  A  K  E   P.O.   120 20   20      P.O.   120 20   20    I.V.     1000  1000

## 2017-02-09 NOTE — Plan of Care
Off floor to IR.

## 2017-02-09 NOTE — Progress Notes
02/09/2017 10:32am Care Coordinator services are not needed for this patient at this time. [place] and will be returning home for PCP follow up.  Langston MaskerKamesha Anderson  Care Coordinator

## 2017-02-09 NOTE — H&P
?   insulin aspart (NovoLOG) injection  0-3 Units Subcutaneous TID AC Marnette BurgessShah, Chintan Vimalkumar, MD   Stopped at 02/07/17 [id number]   ? ipratropium-albuterol (DUONEB) 0.5-2.5 (3) MG/3ML nebulizer solution 3 mL  3 mL Nebulization Q4H PRN Marnette BurgessShah, Chintan Vimalkumar, MD       ? labetalol (NORMODYNE,TRANDATE) injection 20 mg  20 mg Intravenous Q6H PRN Marnette BurgessShah, Chintan Vimalkumar, MD       ? losartan (COZAAR) tablet 25 mg  25 mg Oral daily Marnette BurgessShah, Chintan Vimalkumar, MD   25 mg at 02/09/17 [id number]   ? metoprolol tartrate (LOPRESSOR) tablet 25 mg  25 mg Oral BID Marnette BurgessShah, Chintan Vimalkumar, MD   25 mg at 02/09/17 [id number]   ? ondansetron (ZOFRAN-ODT) disintegrating tablet 4 mg  4 mg Oral Q6H PRN Marnette BurgessShah, Chintan Vimalkumar, MD        Or   ? ondansetron [facility](ZOFRAN) injection 4 mg  4 mg Intravenous Q6H PRN Marnette BurgessShah, Chintan Vimalkumar, MD       ? pantoprazole (PROTONIX) EC tablet 40 mg  40 mg Oral BID AC Marnette BurgessShah, Chintan Vimalkumar, MD   40 mg at 02/09/17 0840   ? polyethylene glycol (MIRALAX) packet 17 g  17 g Oral BID PRN Marnette BurgessShah, Chintan Vimalkumar, MD       ? traMADol Janean Sark(ULTRAM) tablet 50 mg  50 mg Oral Q6H PRN Marnette BurgessShah, Chintan Vimalkumar, MD       ? traZODone (DESYREL) tablet 50 mg  50 mg Oral Nightly PRN Marnette BurgessShah, Chintan Vimalkumar, MD         Facility-Administered Medications Ordered in Other Encounters   Medication Dose Route Frequency Provider Last Rate Last Dose   ? LR infusion    Continuous PRN Lanae CrumblyMcEwen, Daniel S, CRNA            Allergies:   Allergies   Allergen Reactions   ? Latex Itching       Objective:     Vital Signs:BP: 106/56 (02/06 1220),  Pulse: 53 (02/06 1220), Resp: 13 (02/06 1220), SpO2: 95 % (02/06 1220), Temp: 37.1 ?C (98.7 ?F) (02/06 1100)    Labs:   CBC-   WBC   Date Value Ref Range Status   02/09/2017 6.72 4.5 - 11 x10E3/uL Final     Hemoglobin   Date Value Ref Range Status   02/09/2017 15.2 14.0 - 18.0 g/dL Final     Hematocrit   Date Value Ref Range Status   02/09/2017 42.9 40.0 - 54.0 % Final     Platelet Count   Date Value Ref Range Status

## 2017-02-09 NOTE — Plan of Care
Problem: Pain, Acute & Chronic  Goal: Pain is relieved/acceptable level with minimal side effects  Outcome: Ongoing  Will assess pain levels Q 4 hours and PRN. Realistic pain goals set , 5/10.

## 2017-02-09 NOTE — Op Note
DEPARTMENT OF RADIOLOGY   RADIOLOGY POST PROCEDURE PROGRESS NOTE     Practitioners Present: Daniel Siragusa, MD, Michael Shabandi, MD, J. Pirris, MD, B. Bush, MD, and the CT surgery team.    Procedure: TAG placement    Surgical Site:   1. Right groin cutdown   2. Left groin percutaneous access  3. Descending thoracic aorta graft placement    Contrast Administered: Omnipaque, mL Administered: 110, Concentration: 300    Medications Given During Procedure: General Anesthesia     Complications: None    Estimated Blood Loss: 50 mL    Specimens Obtained: None    Preliminary Report: Successful Thoracic Aortic Graft (TAG) placement with exclusion of penetrating ulcer of the proximal descending aorta. Two 34 mm x 10 cm overlapping grafts placed with good result and preservation of the left subclavian artery. The patient remained stable during the entire procedure.    Riley Evans  02/09/2017 4:55 PM

## 2017-02-09 NOTE — H&P
02/09/2017 115 (L) 140 - 440 thou/cu mm Final     CMP-  Sodium   Date Value Ref Range Status   02/09/2017 138 135 - 145 mmol/L Final     Comment:     checked      Potassium   Date Value Ref Range Status   02/09/2017 4.2 3.3 - 4.6 mmol/L Final     Chloride   Date Value Ref Range Status   02/09/2017 102 101 - 110 mmol/L Final     CO2   Date Value Ref Range Status   02/09/2017 25 21 - 29 mmol/L Final     Glucose   Date Value Ref Range Status   02/09/2017 136 (H) 71 - 99 mg/dL Final     Urea Nitrogen   Date Value Ref Range Status   02/09/2017 19 6 - 22 mg/dL Final     Creatinine   Date Value Ref Range Status   02/09/2017 1.15 0.67 - 1.17 mg/dL Final     Albumin   Date Value Ref Range Status   02/09/2017 4.4 3.8 - 4.9 g/dL Final     Total Bilirubin   Date Value Ref Range Status   02/09/2017 0.9 0.2 - 1.0 mg/dL Final     ALT   Date Value Ref Range Status   02/09/2017 32 10 - 42 IU/L Final     AST   Date Value Ref Range Status   02/09/2017 26 14 - 33 IU/L Final     EGFR   Date Value Ref Range Status   02/09/2017 >59 mL/min/1.73M2 Final     Comment:       Reference range: =>90 ml/min/1.73M2  eGFR estimates are unable to accurately differentiate levels of GFR above 60 ml/min/1.73M2.     INR-   INR   Date Value Ref Range Status   02/09/2017 1.1 0.9 - 1.1 Final       Exam:  Lungs: No increased work of breathing  Abdomen: soft, non-tender; bowel sounds normal; no masses,  no organomegaly   Extremities: Exam deferred (Non-contributory)  Cardiovascular: regular rate and rhythm    Pulses: Femoral R:2+ (normal)/L:2+ (normal), Dorsalis Pedis R:2+ (normal)/L:2+ (normal) and Posterior Tibial R:1+ (weak)/L:1+ (weak)    Planned Surgical Site: bilateral groins    Assessment and Plan:     Impression: 72yo M with PAU    Plan: TEVAR        Orlie Pollen, MD  02/09/2017 12:34 PM

## 2017-02-09 NOTE — Progress Notes
Patient arrived to room 530 at 1710 with IR team at bedside. Patient arrived on room air, a line in right radial. Foley to gravity.PA McConnell stated to keep SBP between 130-170 in order to perfuse spine. Will continue to closely monitor.

## 2017-02-09 NOTE — Plan of Care
Pre op wash completed at this time.

## 2017-02-09 NOTE — Plan of Care
Problem: Pain, Acute & Chronic  Goal: Pain is relieved/acceptable level with minimal side effects  Outcome: Ongoing  Will assess pain levels Q 4 hours and PRN. Realistic pain goals set , 5/10.

## 2017-02-09 NOTE — Progress Notes
Department of Medicine  General Internal Medicine  Hospitalist Service      Admit Date:02/07/2017  9:46 AM   LOS: 2 days    History of Present Illness:       Denies any new issues / awaiting surgical intervention this AM         REVIEW OF SYSTEMS:    All other systems reviewed and are negative except those stated above.     Current Hospital Medications:  Scheduled:   ? acetaminophen  1,000 mg Oral Q6H   ? allopurinol  100 mg Oral daily   ? aluminum-magnesium-simethicone hydroxide  30 mL Oral TID W/ Meals   ? aspirin  81 mg Oral daily   ? atorvastatin  40 mg Oral Nightly   ? ceFAZolin IV mini-bag plus 1G  1 g Intravenous Q8H   ? cyclobenzaprine  5 mg Oral TID   ? gabapentin  600 mg Oral TID   ? heparin  5,000 Units Subcutaneous Q12H [facility]   ? insulin aspart  0-3 Units Subcutaneous TID AC   ? lidocaine   Subcutaneous Once   ? losartan  25 mg Oral daily   ? melatonin  6 mg Oral Nightly   ? pantoprazole  40 mg Oral BID AC      ? 0.9 % NaCl     ? nitroGLYCERIN       heparin (porcine), 0.9% NaCl flush, acetaminophen, bisacodyl, dextrose, enalaprilat, fentaNYL, glucose, ipratropium-albuterol, ondansetron, ondansetron **OR** [DISCONTINUED] ondansetron, oxyCODONE, polyethylene glycol, traZODone      Physical Exam:     Patient Vitals for the past 24 hrs:   BP Temp Temp src Pulse Resp SpO2   02/09/17 1743 124/64 - - - 14 99 %   02/09/17 1220 106/56 - - 53 13 95 %   02/09/17 1200 - - - 57 11 96 %   02/09/17 1100 - 37.1 ?C (98.7 ?F) - - - -   02/09/17 0837 116/62 - - - - -   02/09/17 0800 - 36.4 ?C (97.6 ?F) - - - -   02/09/17 0400 - 36.7 ?C (98.1 ?F) Oral - - -   02/09/17 0000 - 36.9 ?C (98.4 ?F) Oral - - -   02/08/17 1922 115/64 36.6 ?C (97.9 ?F) Oral 92 18 -          Date 02/08/17 1500 - 02/09/17 0659 02/09/17 0700 - 02/10/17 0659   Shift 1500-2259 2300-0659 24 Hour Total 0700-1459 1500-2259 2300-0659 24 Hour Total   I  N  T  A  K  E   P.O.   120 20   20      P.O.   120 20   20    I.V.     1000  1000

## 2017-02-09 NOTE — H&P
?   insulin aspart (NovoLOG) injection  0-3 Units Subcutaneous TID AC Shah, Chintan Vimalkumar, MD   Stopped at 02/07/17 1852   ? ipratropium-albuterol (DUONEB) 0.5-2.5 (3) MG/3ML nebulizer solution 3 mL  3 mL Nebulization Q4H PRN Shah, Chintan Vimalkumar, MD       ? labetalol (NORMODYNE,TRANDATE) injection 20 mg  20 mg Intravenous Q6H PRN Shah, Chintan Vimalkumar, MD       ? losartan (COZAAR) tablet 25 mg  25 mg Oral daily Shah, Chintan Vimalkumar, MD   25 mg at 02/09/17 0837   ? metoprolol tartrate (LOPRESSOR) tablet 25 mg  25 mg Oral BID Shah, Chintan Vimalkumar, MD   25 mg at 02/09/17 0838   ? ondansetron (ZOFRAN-ODT) disintegrating tablet 4 mg  4 mg Oral Q6H PRN Shah, Chintan Vimalkumar, MD        Or   ? ondansetron (ZOFRAN) injection 4 mg  4 mg Intravenous Q6H PRN Shah, Chintan Vimalkumar, MD       ? pantoprazole (PROTONIX) EC tablet 40 mg  40 mg Oral BID AC Shah, Chintan Vimalkumar, MD   40 mg at 02/09/17 0840   ? polyethylene glycol (MIRALAX) packet 17 g  17 g Oral BID PRN Shah, Chintan Vimalkumar, MD       ? traMADol (ULTRAM) tablet 50 mg  50 mg Oral Q6H PRN Shah, Chintan Vimalkumar, MD       ? traZODone (DESYREL) tablet 50 mg  50 mg Oral Nightly PRN Shah, Chintan Vimalkumar, MD         Facility-Administered Medications Ordered in Other Encounters   Medication Dose Route Frequency Provider Last Rate Last Dose   ? LR infusion    Continuous PRN McEwen, Daniel S, CRNA            Allergies:   Allergies   Allergen Reactions   ? Latex Itching       Objective:     Vital Signs:BP: 106/56 (02/06 1220),  Pulse: 53 (02/06 1220), Resp: 13 (02/06 1220), SpO2: 95 % (02/06 1220), Temp: 37.1 ?C (98.7 ?F) (02/06 1100)    Labs:   CBC-   WBC   Date Value Ref Range Status   02/09/2017 6.72 4.5 - 11 x10E3/uL Final     Hemoglobin   Date Value Ref Range Status   02/09/2017 15.2 14.0 - 18.0 g/dL Final     Hematocrit   Date Value Ref Range Status   02/09/2017 42.9 40.0 - 54.0 % Final     Platelet Count   Date Value Ref Range Status

## 2017-02-09 NOTE — Op Note
DEPARTMENT OF RADIOLOGY   RADIOLOGY POST PROCEDURE PROGRESS NOTE     Practitioners Present: Shirlee Moreaniel Siragusa, MD, Valere DrossMichael Shabandi, MD, Nicole CellaJ. Pirris, MD, B. Danae OrleansBush, MD, and the CT surgery team.    Procedure: TAG placement    Surgical Site:   1. Right groin cutdown   2. Left groin percutaneous access  3. Descending thoracic aorta graft placement    Contrast Administered: Omnipaque, mL Administered: 110, Concentration: 300    Medications Given During Procedure: General Anesthesia     Complications: None    Estimated Blood Loss: 50 mL    Specimens Obtained: None    Preliminary Report: Successful Thoracic Aortic Graft (TAG) placement with exclusion of penetrating ulcer of the proximal descending aorta. Two 34 mm x 10 cm overlapping grafts placed with good result and preservation of the left subclavian artery. The patient remained stable during the entire procedure.    Claudean Severanceaudare M Shabandi  02/09/2017 4:55 PM

## 2017-02-09 NOTE — Anesthesia Preprocedure Evaluation
verbal response, and with 6 for motor response.I have performed a pre-anesthesia examination, evaluation, and prescribed the anesthesia plan.   Pt ate 1/2 graham cracker at 730am

## 2017-02-09 NOTE — Op Note
DEPARTMENT OF RADIOLOGY   RADIOLOGY POST PROCEDURE PROGRESS NOTE     Practitioners Present: Shirlee Moreaniel Siragusa, MD and Valere DrossMichael Shabandi, MD    Procedure: TAG placement    Surgical Site:   1. Right groin cutdown   2. Left groin percutaneous access  3. Descending thoracic aorta graft placement    Contrast Administered: Omnipaque, mL Administered: 110, Concentration: 300    Medications Given During Procedure: General Anesthesia     Complications: None    Estimated Blood Loss: 50 mL    Specimens Obtained: None    Preliminary Report: Successful Thoracic Aortic Graft (TAG) placement with exclusion of penetrating ulcer of the proximal descending aorta. Two 34 mm x 10 cm overlapping grafts placed with good result and preservation of the left subclavian artery. The patient remained stable during the entire procedure.    Claudean Severanceaudare M Shabandi  02/09/2017 4:55 PM

## 2017-02-09 NOTE — Progress Notes
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

## 2017-02-09 NOTE — H&P
Department of Radiology  Special Procedures   Pre-Procedure History & Physical      Subjective:     Chief Complaint:   Chief Complaint   Patient presents with   ? 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 Diabetes mellitus (CMS-HCC code)    ? GERD (gastroesophageal reflux disease)    ? Hypertension        Past Surgical History:   Past Surgical History:   Procedure Laterality Date   ? APPENDECTOMY     ? CARDIAC SURGERY         Social History:   Social History   Substance Use Topics   ? Smoking status: Former Smoker     Types: Cigarettes     Quit date: 2003   ? Smokeless tobacco: Never Used   ? Alcohol use No        Family History: History reviewed. No pertinent family history.    Medications:   Current Facility-Administered Medications   Medication Dose Route Frequency Provider Last Rate Last Dose   ? 0.9 % NaCl 100 mL bolus  100 mL Intravenous PRN Perry, Noel Masters, PA-C       ? acetaminophen (TYLENOL) tablet 650 mg  650 mg Oral Q4H PRN Shah, Chintan Vimalkumar, MD       ? allopurinol (ZYLOPRIM) tablet 100 mg  100 mg Oral daily Shah, Chintan Vimalkumar, MD   100 mg at 02/09/17 0840   ? aluminum-mag hydroxide-simethicone (MAALOX PLUS) suspension 30 mL  30 mL Oral TID W/ Meals Muttevi, Dhanvantri Naga S, MD   Stopped at 02/09/17 1222   ? aspirin EC tablet 81 mg  81 mg Oral daily Shah, Chintan Vimalkumar, MD   81 mg at 02/09/17 0838   ? atorvastatin (LIPITOR) tablet 40 mg  40 mg Oral Nightly Shah, Chintan Vimalkumar, MD   40 mg at 02/08/17 2036   ? dextrose 50 % injection 15 g  30 mL Intravenous PRN Shah, Chintan Vimalkumar, MD       ? gabapentin (NEURONTIN) capsule 600 mg  600 mg Oral TID Perry, Noel Masters, PA-C   600 mg at 02/09/17 0839   ? glucose chewable tablet 16 g  16 g Oral PRN Shah, Chintan Vimalkumar, MD

## 2017-02-09 NOTE — Progress Notes
Patient arrived to room 530 at 1710 with IR team at bedside. Patient arrived on room air, a line in right radial. Foley to gravity.PA Charlton HawsMcConnell stated to keep SBP between 130-170 in order to perfuse spine. Will continue to closely monitor.

## 2017-02-09 NOTE — Plan of Care
Pre op wash completed at this time.

## 2017-02-09 NOTE — H&P
Department of Radiology  Special Procedures   Pre-Procedure History & Physical      Subjective:     Chief Complaint:   Chief Complaint   Patient presents with   ? 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 Diabetes mellitus (CMS-HCC code)    ? GERD (gastroesophageal reflux disease)    ? Hypertension        Past Surgical History:   Past Surgical History:   Procedure Laterality Date   ? APPENDECTOMY     ? CARDIAC SURGERY         Social History:   Social History   Substance Use Topics   ? Smoking status: Former Smoker     Types: Cigarettes     Quit date: 2003   ? Smokeless tobacco: Never Used   ? Alcohol use No        Family History: History reviewed. No pertinent family history.    Medications:   Current Facility-Administered Medications   Medication Dose Route Frequency Provider Last Rate Last Dose   ? 0.9 % NaCl 100 mL bolus  100 mL Intravenous PRN Randie HeinzPerry, Noel Masters, PA-C       ? acetaminophen (TYLENOL) tablet 650 mg  650 mg Oral Q4H PRN Marnette BurgessShah, Chintan Vimalkumar, MD       ? allopurinol (ZYLOPRIM) tablet 100 mg  100 mg Oral daily Marnette BurgessShah, Chintan Vimalkumar, MD   100 mg at 02/09/17 0840   ? aluminum-mag hydroxide-simethicone (MAALOX PLUS) suspension 30 mL  30 mL Oral TID W/ Meals [place], [place] Wynetta FinesNaga S, MD   Stopped at 02/09/17 1222   ? aspirin EC tablet 81 mg  81 mg Oral daily Marnette BurgessShah, Chintan Vimalkumar, MD   81 mg at 02/09/17 [id number]   ? atorvastatin (LIPITOR) tablet 40 mg  40 mg Oral Nightly Marnette BurgessShah, Chintan Vimalkumar, MD   40 mg at 02/08/17 2036   ? dextrose 50 % injection 15 g  30 mL Intravenous PRN Marnette BurgessShah, Chintan Vimalkumar, MD       ? gabapentin (NEURONTIN) capsule 600 mg  600 mg Oral TID Randie HeinzPerry, Noel Masters, PA-C   600 mg at 02/09/17 [id number]   ? glucose chewable tablet 16 g  16 g Oral PRN Marnette BurgessShah, Chintan Vimalkumar, MD

## 2017-02-10 DIAGNOSIS — E785 Hyperlipidemia, unspecified: Secondary | ICD-10-CM

## 2017-02-10 DIAGNOSIS — I2510 Atherosclerotic heart disease of native coronary artery without angina pectoris: Secondary | ICD-10-CM

## 2017-02-10 DIAGNOSIS — E119 Type 2 diabetes mellitus without complications: Secondary | ICD-10-CM

## 2017-02-10 DIAGNOSIS — I712 Thoracic aortic aneurysm, without rupture: Principal | ICD-10-CM

## 2017-02-10 DIAGNOSIS — K219 Gastro-esophageal reflux disease without esophagitis: Secondary | ICD-10-CM

## 2017-02-10 DIAGNOSIS — Z7984 Long term (current) use of oral hypoglycemic drugs: Secondary | ICD-10-CM

## 2017-02-10 DIAGNOSIS — Z7982 Long term (current) use of aspirin: Secondary | ICD-10-CM

## 2017-02-10 DIAGNOSIS — I10 Essential (primary) hypertension: Secondary | ICD-10-CM

## 2017-02-10 DIAGNOSIS — Z87891 Personal history of nicotine dependence: Secondary | ICD-10-CM

## 2017-02-10 DIAGNOSIS — Z79899 Other long term (current) drug therapy: Secondary | ICD-10-CM

## 2017-02-10 DIAGNOSIS — Z951 Presence of aortocoronary bypass graft: Secondary | ICD-10-CM

## 2017-02-10 MED ORDER — NOREPINEPHRINE-SODIUM CHLORIDE 8-0.9 MG/250 ML-% IV SOLN
0-30 ug/min | INTRAVENOUS | Status: DC
Start: 2017-02-10 — End: 2017-02-12

## 2017-02-10 NOTE — Progress Notes
BP below desired parameters after bolus IV fluids, will start IV Vasopressin at 1/2 dose to maintain spinal perfusion

## 2017-02-10 NOTE — Progress Notes
CBC (with or without Differential):  Lab Results   Component Value Date    WBC 9.59 02/10/2017    Hemoglobin 13.1 (L) 02/10/2017    Hematocrit 37.3 (L) 02/10/2017    MCV 94.0 02/10/2017    MCH 33.0 02/10/2017    MCHC 35.1 02/10/2017    RDW 13.1 02/10/2017    Platelet Count 110 (L) 02/10/2017    MPV 10.3 02/10/2017    Neutrophils % 90.9 (H) 02/10/2017    Monocytes % 0.5 (L) 02/10/2017    Eosinophils % 0.0 (L) 02/10/2017    Basophils % 0.1 02/10/2017     BMP/CMP: Lab Results   Component Value Date    Sodium 138 02/10/2017    Potassium 4.6 02/10/2017    Chloride 103 02/10/2017    CO2 19 (L) 02/10/2017    Urea Nitrogen 18 02/10/2017    Creatinine 0.97 02/10/2017    Glucose 169 (H) 02/10/2017    Calcium 8.1 (L) 02/10/2017    Total Protein 5.9 (L) 02/10/2017    Albumin 3.7 (L) 02/10/2017    AST 24 02/10/2017    ALT 27 02/10/2017    EGFR >59 02/10/2017    Total Bilirubin 0.9 02/10/2017    Alkaline Phosphatase 49 02/10/2017    Magnesium 2.2 02/08/2017   , Coagulation: Lab Results   Component Value Date    PROTIME 13.6 02/09/2017    INR 1.1 02/09/2017     ABG: No results found for: PHART, PCO2ART, PO2ART, HCO3ART, BEXCART, BDEFART, METHHGBART, CARBHGBART, HBODIRART, O2SATART, COMBGART  PREALBUMIN: No results found for: PREALBUMIN    CXR Interpretation: Will continue to monitor.  This is the most recent resulted CXR within the last 24 hours on this encounter   XR CHEST SINGLE VIEW [[id number]]    Narrative PORTABLE CHEST X-RAY (SINGLE FRONTAL VIEW)    Comparison: Multiple x-rays dating back to 02/07/2017    History: Post-operative state    Findings: Prior median sternotomy and interim placement of thoracic aortic vascular graft.   Cardiac silhouette is unchanged. Lungs are clear. No pneumothorax or pleural effusion. Osseous   structures are intact.      Impression Impression: No significant change from prior examination.    I personally reviewed the images and the residents findings and agree with the above.

## 2017-02-10 NOTE — Plan of Care
Problem: Outcome Measure Goals  Goal: Increase AMPAC score  Patient will demonstrate improvement in balance and functional mobility as indicated by an increase in AMPAC score to 22/24 in order to decrease risk for falls.   Outcome: Ongoing      Problem: Functional Mobility  Goal: Patient will transition supine to/from sit in bed from left/right  Patient will transition supine to/from sit in bed with Modified Independence (Mod I) and minimal cues in order to prepare for out of bed mobility.   Outcome: Ongoing      Problem: Locomotion  Goal: Patient will ambulate  Patient will ambulate 27900ft feet with Least restrictive device, Modified Independence (Mod I) and minimal cues in order to prepare for community re-entry.   Outcome: Ongoing

## 2017-02-10 NOTE — Progress Notes
Per MD Pirris do not surpass mcg on the Levophed.  Pt still denies numbness and tingling.  Will continue to monitor and follow the plan of care.

## 2017-02-10 NOTE — Plan of Care
Problem: Outcome Measure Goals  Goal: Increase AMPAC score  Patient will demonstrate improvement in balance and functional mobility as indicated by an increase in AMPAC score to 22/24 in order to decrease risk for falls.   Outcome: Ongoing      Problem: Functional Mobility  Goal: Patient will transition supine to/from sit in bed from left/right  Patient will transition supine to/from sit in bed with Modified Independence (Mod I) and minimal cues in order to prepare for out of bed mobility.   Outcome: Ongoing      Problem: Locomotion  Goal: Patient will ambulate  Patient will ambulate 200ft feet with Least restrictive device, Modified Independence (Mod I) and minimal cues in order to prepare for community re-entry.   Outcome: Ongoing

## 2017-02-10 NOTE — Consults
Occupational Therapy Evaluation      Start Time (min): 1050  End Time (min): 1106  Total Time (min): 16  Room/Bed: 530/530-01    Occupational Profile: Riley Evans is a 72 y.o. male admitted on 02/07/2017 chest pain. S/p TAG placement with exclusion of penetrating ulcer of proximal descending aorta on 02/09/17. PMHx CAD s/p CABG 2014, GERD, HTN, DM.      ICD-10-CM ICD-9-CM   1. Penetrating ulcer of aorta (CMS-HCC code) I71.9 441.9   2. Chest pain in adult R07.9 786.50   3. Type 2 diabetes mellitus without complication, unspecified whether long term insulin use (CMS-HCC code) E11.9 250.00   4. Hypertension, unspecified type I10 401.9       Precautions:  ? FALL  ? Cardiac    SBP goal 130-170    Extremity Precautions:  ? None indicated    Orthotic, Protective, & Supportive Devices:  ? None    PLOF: Pt lives with spouse in one story house and used no DME at baseline. Pt retired but Ind with ADLs and IADLs at baseline.     Subjective: Pt agreeable to OT; RN consent.    **Was an interpreter used: NA    Pain: 1/10 reported in R groin    Vitals: BP 108/63 seated, 130/61 seated after mobility    Observations: pt seated in recliner, ICU lines, WVAC, IVF.  Vision: WFL  Cognition: WFL  Coordination: WFL  Sensation: WFL  UE ROM/Strength: WFL    Bed Mobility: Supine to/from Sit: pt already OOBTC  Functional Transfers: Sit <> Stand: Modified Independent  Functional Mobility: pt mobile with 4WW and supervision    Balance:   ? Sitting: Static  Independent (I), Dynamic  Modified Independent (Mod I)  ? Standing: Static  Supervision (S), Dynamic  Supervision (S)    ADLs: pt Min A-Mod I with ADLs at this time.    Patient/Caregiver Education: Pt educated to complete ADLs seated compared to standing as needed for safety. Pt educated spread out demanding tasks throughout the day to prevent fatigue.     Outcome Measures:  ? AM-PAC "6-clicks" Short Form: Raw Score: 22. AM-PAC Score: 47.10. CMS Score: 25.02% - CJ    Treatment on Evaluation

## 2017-02-10 NOTE — Consults
Physical Therapy Evaluation      Start Time (min): 8:41  End Time (min): 9:01  Total Time (min): 20  Room/Bed: 530/530-01    History of Present Illness: Riley Evans is a 72 y.o. male admitted on 02/07/2017 chest pain. S/p TAG placement with exclusion of penetrating ulcer of proximal descending aorta on 02/09/17. PMHx CAD s/p CABG 2014, GERD, HTN, DM.        ICD-10-CM ICD-9-CM   1. Penetrating ulcer of aorta (CMS-HCC code) I71.9 441.9   2. Chest pain in adult R07.9 786.50   3. Type 2 diabetes mellitus without complication, unspecified whether long term insulin use (CMS-HCC code) E11.9 250.00   4. Hypertension, unspecified type I10 401.9     Past Medical History:   Diagnosis Date   ? Diabetes mellitus (CMS-HCC code)    ? GERD (gastroesophageal reflux disease)    ? Hypertension      Past Surgical History:   Procedure Laterality Date   ? APPENDECTOMY     ? CARDIAC SURGERY         Precautions:  ? FALL  ? Cardiac    Extremity Precautions:  ? None indicated    Orthotic, Protective, & Supportive Devices:  ? None    Living Environment/Function:  ? Lives: with spouse  Home assistance: 24/7  ? Lives in: house:  one story  ? Stair/steps to enter: 0  ? Home DME: does not use equipment  ? Prior Level of Function: was independent with self-care, transfers, ambulation, household tasks and/or recreational activities    Subjective: Pt agreeable to session, spouse present in room.   ? Pain Pre-Treatment: Pt reported slight discomfort in groin 2/2 foley cathter   ? Pain Post-Treatment: No, 0/10    **Was an interpreter used: NA    Examination:    Observation: Pt recieved in recliner chair connected to ICU leads and lines, Right wound vac, foley. Pt left as recieved.     Vitals: Vitals stable    Cognition:   ? Alert and Oriented to Person, Place, Time and Situation  ? Command Following: Follows Multi-step commands    Right Upper Extremity:  ? Within Functional Limits (WFL) for AROM/PROM and strength    Left Upper Extremity:

## 2017-02-10 NOTE — Progress Notes
history suggest GI etiology of his chest pain, will keep on PPI and Maalox   ? Coronary artery disease status post CABG 2014 - continue aspirin and statin, beta blocker  ? Hypertension, controlled - continue on metoprolol, losartan  ? Hyperlipidemia - statin  ? Diet-controlled diabetes mellitus 2, insulin sliding scale as needed        DVT prophylaxis  full code      Patient being taken over by CT surgery as primary team, hospitalist team to sign off, call back if needed    Dhanvantri Naga S Muttevi, MD  2:54 PM

## 2017-02-10 NOTE — Progress Notes
Informed MD Jacqlyn KraussSylvester about patients BP being below the desired parameters.  New orders received.  Will continue to monitor and follow the plan of care.

## 2017-02-10 NOTE — Plan of Care
Problem: Pain, Acute & Chronic  Goal: Pain is relieved/acceptable level with minimal side effects    Intervention: Assess pain level  Pain assessed for pain every four hours and prn.  Patient medicated per mar.  Will continue to monitor and follow the plan of care.

## 2017-02-10 NOTE — Progress Notes
Charge RN Debra spoke with MD Pirris and informed him of the pt BP being blow the desired parameters.  Vasopressin was started per order and Levophed has been started.  MD Pirris stated that is was okay to start the levophed but do not exceed mcg.  To add the pt BP continues to be below the desired values.  Notified MD Sylvester of MD Pirris orders, and there are no new orders at this time.  Will continue to monitor and follow the plan of care.

## 2017-02-10 NOTE — Progress Notes
Department of Surgery  Division of Cardiothoracic Surgery  Post-op Progress Note        Admission Date: 02/07/2017   Surgery Date: 02/09/2017   Post-Op Day: 1 Day Post-Op    Pre-Op Diagnosis Codes: Penetrating atherosclerotic ulcer of aorta (CMS-HCC code) [I70.0]  Post-Op Diagnosis Codes:     * Penetrating atherosclerotic ulcer of aorta (CMS-HCC code) [I70.0]    Operations and Major Procedures:     Procedure(s):  THORACIC ENDOVASCULAR ANEURYSM REPAIR (TEVAR) (N/A)  -------------------  Event Time In   Out of Room 02/09/2017 1708       Brief History: [place] with penetrating uler of the descending aorta    Subjective: Patient is OOB in the chair. No neurological disfunction.    Airway:      RA       Hemodynamic Monitoring:   Temp  36.7 ...   36.8 ...   36.3 ...      Temp      Temp Source   Oral   Oral        Temp Source     Heart Rate (monitor)  76 68 75 75 86 75 84 85 85 84 99  Heart Rate (monitor)     Rhythm  NSR    NSR    NSR    Rhythm     Resp rate  18 14 13 12 21 15 20 13 20 20 21  Resp rate     BP (cuff)  108/55 110/61 109/53 108/60 118/61 111/60 114/63 105/59 118/68 113/63 93/57  BP (cuff)     MAP (cuff)  81 76 73 79 80 82 74 82 92 73 73  MAP (cuff)     BP (a-line)  107/40 106/41 106/41 121/49 126/51 123/47 93/43 103/41 101/46 120/47 94/37  BP (a-line)     MAP (a-line)  57 59 59 69 70 68 57 58 62 66 62             Intake/Output Summary (Last 24 hours) at 02/10/17 1110  Last data filed at 02/10/17 0800   Gross per 24 hour   Intake          1038.53 ml   Output             2310 ml   Net         -1271.47 ml          LINES AND DRAINS:   Lines:  Arterial Line 02/09/17 Right Radial (Active)   Site Assessment Dry;Clean;Intact 02/10/2017  8:00 AM   Arterial Line Status Positional;Pulsatile blood flow 02/10/2017  8:00 AM   Arterial Line Waveform Appropriate;Square wave test performed 02/10/2017  8:00 AM   Arterial Line Interventions Zeroed and calibrated;Leveled;Connections

## 2017-02-10 NOTE — Progress Notes
Informed MD Jacqlyn KraussSylvester about patients BP Not being below the desired parameters.  New orders received.

## 2017-02-10 NOTE — Consults
moderate (1-2 personal factors/comorbidities) low (addressing 1-2 elements) low (stable) low (using standardized patient assessment instrument and/or measureable assessment of functional outcome)   PT Evaluation Complexity Charge: low     **Co-Treatment: NA  _________________________   Marko PlumeAshley Wilcock, SPT   02/10/2017

## 2017-02-10 NOTE — Progress Notes
history suggest GI etiology of his chest pain, will keep on PPI and Maalox   ? Coronary artery disease status post CABG 2014 - continue aspirin and statin, beta blocker  ? Hypertension, controlled - continue on metoprolol, losartan  ? Hyperlipidemia - statin  ? Diet-controlled diabetes mellitus 2, insulin sliding scale as needed        DVT prophylaxis  full code      Patient being taken over by CT surgery as primary team, hospitalist team to sign off, call back if needed    Dhanvantri Burney GauzeNaga S Muttevi, MD  2:54 PM

## 2017-02-10 NOTE — Progress Notes
Per MD Pirris do not surpass mcg on the Levophed.  Pt still denies numbness and tingling.  Will continue to monitor and follow the plan of care.

## 2017-02-10 NOTE — Consults
?   TREATMENT TIME: 8 min  ? ADLs: pt simulated toilet transfer with CGA for balance.     Post Treatment:   Patient Position/safety: Sitting in recliner, Call Bell within reach, Tray table within reach, All lines and leads intact, Handoff to nurse on patient position    Assessment: Patient is Min A-Mod I with all ADLs, mobility, and transfers. [facility] ROM/strength, cognition, coordination, sensation, and vision. No skilled OT services indicated at this time.    Discharge Recommendations:   ? Discharge Disposition: Home with intermittent caregiver supervision    ? DME Recommendations:  1. defer mobility device to PT    Plan: Follow up OT    OT Evaluation Complexity   History Examination Decision Making   low (brief review of history related to the presenting problem) low (addressing 1-3 performance deficits) moderate (detailed assessment, minimal to moderate modification of assessments, co-morbidities are present)   OT Evaluation Complexity Charge: low     **Co-Treatment: NA  ____________________________   Fidela SalisburyGrace Anglim, OT   02/10/2017

## 2017-02-10 NOTE — Progress Notes
Spoke with MD sylvester in regards to pt BP and the parameters, I was told to maintain a SBP between 130-170. I made him aware that the cuff pressure was reading lower than the desired parameters and asked if I could go by the arterial line reading, which is within the goal and he replied,  it is okay to take the arterial  line reading.  Will continue to monitor and follow the plan of care.

## 2017-02-10 NOTE — Progress Notes
Informed MD Sylvester about patients BP Not being below the desired parameters.  New orders received.

## 2017-02-10 NOTE — Progress Notes
BP below desired parameters after bolus IV fluids, will start IV Vasopressin at 1/2 dose to maintain spinal perfusion

## 2017-02-10 NOTE — Progress Notes
Department of Medicine  General Internal Medicine  Hospitalist Service      Admit Date:02/07/2017  9:46 AM   LOS: 3 days    History of Present Illness:     Patient completed TEVAR yesterday - denies any new issues - No bleeding from the groin site, no further chest pains        REVIEW OF SYSTEMS:    All other systems reviewed and are negative except those stated above.     Current Hospital Medications:  Scheduled:   ? acetaminophen  1,000 mg Oral Q6H   ? allopurinol  100 mg Oral daily   ? aluminum-magnesium-simethicone hydroxide  30 mL Oral TID W/ Meals   ? aspirin  81 mg Oral daily   ? atorvastatin  40 mg Oral Nightly   ? cyclobenzaprine  5 mg Oral TID   ? gabapentin  600 mg Oral TID   ? heparin  5,000 [facility]   ? insulin aspart  0-3 Units Subcutaneous TID AC   ? losartan  25 mg Oral daily   ? melatonin  6 mg Oral Nightly   ? pantoprazole  40 mg Oral BID AC      ? 0.9 % NaCl Stopped (02/09/17 1842)   ? nitroGLYCERIN     ? norepinephrine Stopped (02/10/17 1130)   ? vasopressin (PITRESSIN) infusion Stopped (02/10/17 0930)     acetaminophen, bisacodyl, dextrose, enalaprilat, fentaNYL, glucose, ipratropium-albuterol, ondansetron, ondansetron **OR** [DISCONTINUED] ondansetron, oxyCODONE, polyethylene glycol, traZODone      Physical Exam:     Patient Vitals for the past 24 hrs:   BP Temp Temp src Pulse Resp SpO2   02/10/17 1330 120/49 - - 94 18 97 %   02/10/17 1305 - - - 100 15 96 %   02/10/17 1300 105/57 - - 99 16 96 %   02/10/17 1240 - - - 102 16 96 %   02/10/17 1230 132/53 - - 99 16 96 %   02/10/17 1218 - - - - 24 97 %   02/10/17 1217 - - - - 19 98 %   02/10/17 1216 - - - - 18 98 %   02/10/17 1210 - - - - 20 97 %   02/10/17 1200 116/57 - - - 19 98 %   02/10/17 1130 133/60 - - 91 21 98 %   02/10/17 1100 108/63 37 ?C (98.6 ?F) - 93 20 97 %   02/10/17 1030 123/59 - - - 17 96 %   02/10/17 1010 - - - - 22 95 %   02/10/17 1000 93/57 - - - 21 97 %   02/10/17 0930 113/63 - - - 20 95 %

## 2017-02-10 NOTE — Progress Notes
02/10/17 0900 133/73 - - - 22 98 %   02/10/17 0830 118/68 - - - 20 97 %   02/10/17 0800 106/65 - - - 17 97 %   02/10/17 0700 105/59 36.3 ?C (97.4 ?F) - - 13 97 %   02/10/17 0600 114/63 - - - 20 97 %   02/10/17 0500 111/60 - - - 15 97 %   02/10/17 0400 118/61 36.8 ?C (98.3 ?F) Oral - 21 97 %   02/10/17 0300 108/60 - - - 12 96 %   02/10/17 0200 109/53 - - - 13 97 %   02/10/17 0100 110/61 36.7 ?C (98 ?F) Oral - 14 (!) 89 %   02/10/17 0000 108/55 - - - 18 93 %   02/09/17 2300 103/52 - - - 13 96 %   02/09/17 2200 99/55 - - - 23 97 %   02/09/17 2100 104/56 - - - 23 98 %   02/09/17 2000 103/51 36.4 ?C (97.6 ?F) - - 15 96 %   02/09/17 1900 116/56 - - - 13 97 %   02/09/17 1800 115/63 - - - 17 97 %   02/09/17 1745 125/66 - - - 19 99 %   02/09/17 1743 124/64 - - - 14 99 %   02/09/17 1730 - - - - 15 99 %   02/09/17 1715 - - - - 20 99 %          Date 02/09/17 0700 - 02/10/17 0659 02/10/17 0700 - 02/11/17 0659   Shift 0700-1459 1500-2259 2300-0659 24 Hour Total 0700-1459 1500-2259 2300-0659 24 Hour Total   I  N  T  A  K  E   P.O. 20   20 375   375      P.O. 20   20 375   375    I.V.  1000 38.5 1038.5 15.3   15.3      Volume (ml) Vasopressin   19.1 19.1          Volume (mL) Norepinephrine   19.5 19.5 15.3   15.3      Volume (mL) (LR infusion)  200  200          Volume (mL) (0.9 % NaCl infusion)  800  800        Shift Total 20 1000 38.5 1058.5 390.3   390.3   O  U  T  P  U  T   Urine  1010 900 1910 225   225      Urine  250  250          Output (mL) ([REMOVED] Urethral Catheter 02/09/17)  760 900 1660 225   225    Blood  300  300          EBL (mL)  300  300        Shift Total  1310 900 2210 225   225   NET 20 -310 -861.5 -1151.5 165.3   165.3       Physical Exam:  General: alert, cooperative, no distress  Chest: Lungs:clear to auscultation bilaterally  Cardiac: regular rate and rhythm, S1, S2 normal, no murmur, click, rub or gallop  Abdomen: soft, non-tender; bowel sounds normal; no masses,  no organomegaly

## 2017-02-10 NOTE — Progress Notes
Read By - Levada SchillingSudha Bogineni-Misra    Electronically Verified By - Levada SchillingSudha Bogineni-Misra    Released Date Time - 02/10/2017 9:48 AM    Resident - Lucilla EdinAmie Leon         OVERALL ASSESSMENT:   Doing well. SBP has been soft and he was placed of vaso and levo to increase the spinal perfusion. This is weaning and SBP staying satisfactory this AM. His lower extremeties have normal function.  Plan to DC the foley, art line and IV's. Increase activity and DC to home tomorrow.      PLAN:     Neuro:  Alert, Orientated x3, Follows commands     Resp:  Supplemental O2: NC  To be weaned  CV:  Rate/Rhythm- 24 hours: NSR    GI/Fluid Electrolyte Nutrition:  Diet Orders (1440h ago through future)    Start     Ordered    02/10/17 0715  Diet (cardiac fat,chol,sod,caff rstrctd)  DIET EFFECTIVE NOW     Process Instructions:  Low Cholesterol, Low Fat, Low Sodium, Caffiene Restricted       02/10/17 0709         Nutrition Feed/Supplements (Through next 720h)    None        Nausea:  No    GI:  BMI: Body mass index is 28.75 kg/m?.  tolerating diet    Monitor Electrolytes and Replace as Needed:  Monitor I&O's: Hypovolemia  Fluid & Electrolyte Imbalances: Replete as needed  Dietary Supplements: Oral    HEME:  Continue to Follow: Platelet count    ID:  Temp (24hrs), Avg:36.6 ?C (97.8 ?F), Min:36.3 ?C (97.4 ?F), Max:36.8 ?C (98.3 ?F)    Cultures: No results found for this visit on 02/07/17.  Afebrile: No    Nosocomial Infection Prevention:  Following Standard Hospital Hygiene Protocol    GU:  Voiding on own    Endo:  No glycemic control required    Extremities:  Normal function and vascular status.    Mobility: Ambulating halls    Social: Family support    Code Status: Full code    Dispo: Intermediate  1. Anticipated D/C to Home  2. Case Manager Consultation: Yes    Hillary Bowoel Masters Perry  02/10/2017 11:10 AM

## 2017-02-10 NOTE — Consults
?   Within Functional Limits [facility](WFL) for AROM/PROM and strength    Right Lower Extremity:  ? Within Functional Limits [facility](WFL) for AROM/PROM and strength    Left Lower Extremity:  ? Within Functional Limits [facility](WFL) for AROM/PROM and strength    Neurological Examination:  ? Sensation: Intact  ? Proprioception: Intact   ? Coordination: Within Functional Limits  ? Motor Control: Within Functional Limits  ? Tone: WFL  ? Reflexes: Normal    Balance:   ? Sitting: Static  Independent (I), Dynamic  Supervision (S)  ? Standing: Static  Supervision (S), Dynamic  Supervision (S)    Functional Activities:  Bed Mobility:  ? Not Tested: pt recieved in recliner chair  Transfers:  ? Sit to/from Stand: Contact Guard Assist (CGA) with HHAx1 2/2 pt reports of feeling unsteady, denied SOB/ dizziness   Locomotion:  ? GAIT: Patient ambulated   with Rolling Walker, gait belt, Supervision (S). Gait Quality: decreased cadence, slight unsteadiness.    Outcome Measures:  [facility] AM-PAC 6 Clicks Basic Mobility Inpatient Short Form :  HOW MUCH DIFFICULTY DOES THE PATIENT CURRENTLY HAVE? SCORE   1. Turning over in bed (including adjusting bedclothes, sheets and blankets)?       2. Moving from lying on back to sitting on the side of the bed?  3. Sitting down on and standing up from a chair with arms? 3  3  3    HOW MUCH HELP FROM ANOTHER PERSON DOES THE PATIENT CURRENTLY NEED?    4. Moving to and from a bed to a chair (including a wheelchair)?  5. Need help to walk in hospital room?  6. Climbing 3-5 steps with a railing 3  3  3    Scoring: 1 = total (dependent)/unable; 2 = a lot (max/mod assist); 3 = a little (min assist/CGA/SBA/SUP); 4 = none (independent or modified independent).    AM-PAC BASIC MOBILITY SCALE SHORT FORM: Raw Score: 18. AM-PAC Score: 41.05. CMS Score: 40.47% - CK    Treatment on Evaluation   ? TREATMENT TIME: 8 min  ? GAIT: Patient ambulated 4850ft x2 with Rolling Walker, gait belt,

## 2017-02-10 NOTE — Consults
?   Within Functional Limits (WFL) for AROM/PROM and strength    Right Lower Extremity:  ? Within Functional Limits (WFL) for AROM/PROM and strength    Left Lower Extremity:  ? Within Functional Limits (WFL) for AROM/PROM and strength    Neurological Examination:  ? Sensation: Intact  ? Proprioception: Intact   ? Coordination: Within Functional Limits  ? Motor Control: Within Functional Limits  ? Tone: WFL  ? Reflexes: Normal    Balance:   ? Sitting: Static  Independent (I), Dynamic  Supervision (S)  ? Standing: Static  Supervision (S), Dynamic  Supervision (S)    Functional Activities:  Bed Mobility:  ? Not Tested: pt recieved in recliner chair  Transfers:  ? Sit to/from Stand: Contact Guard Assist (CGA) with HHAx1 2/2 pt reports of feeling unsteady, denied SOB/ dizziness   Locomotion:  ? GAIT: Patient ambulated   with Rolling Walker, gait belt, Supervision (S). Gait Quality: decreased cadence, slight unsteadiness.    Outcome Measures:  [place] AM-PAC 6 Clicks Basic Mobility Inpatient Short Form :  HOW MUCH DIFFICULTY DOES THE PATIENT CURRENTLY HAVE? SCORE   1. Turning over in bed (including adjusting bedclothes, sheets and blankets)?       2. Moving from lying on back to sitting on the side of the bed?  3. Sitting down on and standing up from a chair with arms? 3  3  3   HOW MUCH HELP FROM ANOTHER PERSON DOES THE PATIENT CURRENTLY NEED?    4. Moving to and from a bed to a chair (including a wheelchair)?  5. Need help to walk in hospital room?  6. Climbing 3-5 steps with a railing 3  3  3   Scoring: 1 = total (dependent)/unable; 2 = a lot (max/mod assist); 3 = a little (min assist/CGA/SBA/SUP); 4 = none (independent or modified independent).    AM-PAC BASIC MOBILITY SCALE SHORT FORM: Raw Score: 18. AM-PAC Score: 41.05. CMS Score: 40.47% - CK    Treatment on Evaluation   ? TREATMENT TIME: 8 min  ? GAIT: Patient ambulated 50ft x2 with Rolling Walker, gait belt,

## 2017-02-10 NOTE — Progress Notes
02/10/17 0900 133/73 - - - 22 98 %   02/10/17 0830 118/68 - - - 20 97 %   02/10/17 0800 106/65 - - - 17 97 %   02/10/17 0700 105/59 36.3 ?C (97.4 ?F) - - 13 97 %   02/10/17 0600 114/63 - - - 20 97 %   02/10/17 0500 111/60 - - - 15 97 %   02/10/17 0400 118/61 36.8 ?C (98.3 ?F) Oral - 21 97 %   02/10/17 0300 108/60 - - - 12 96 %   02/10/17 0200 109/53 - - - 13 97 %   02/10/17 0100 110/61 36.7 ?C (98 ?F) Oral - 14 (!) 89 %   02/10/17 0000 108/55 - - - 18 93 %   02/09/17 2300 103/52 - - - 13 96 %   02/09/17 2200 99/55 - - - 23 97 %   02/09/17 2100 104/56 - - - 23 98 %   02/09/17 2000 103/51 36.4 ?C (97.6 ?F) - - 15 96 %   02/09/17 1900 116/56 - - - 13 97 %   02/09/17 1800 115/63 - - - 17 97 %   02/09/17 1745 125/66 - - - 19 99 %   02/09/17 1743 124/64 - - - 14 99 %   02/09/17 1730 - - - - 15 99 %   02/09/17 1715 - - - - 20 99 %          Date 02/09/17 0700 - 02/10/17 0659 02/10/17 0700 - 02/11/17 0659   Shift 0700-1459 1500-2259 2300-0659 24 Hour Total 0700-1459 1500-2259 2300-0659 24 Hour Total   I  N  T  A  K  E   P.O. 20   20 375   375      P.O. 20   20 375   375    I.V.  1000 38.5 1038.5 15.3   15.3      Volume (ml) Vasopressin   19.1 19.1          Volume (mL) Norepinephrine   19.5 19.5 15.3   15.3      Volume (mL) (LR infusion)  200  200          Volume (mL) (0.9 % NaCl infusion)  800  800        Shift Total 20 1000 38.5 1058.5 390.3   390.3   O  U  T  P  U  T   Urine  1010 900 1910 225   225      Urine  250  250          Output (mL) ([REMOVED] Urethral Catheter 02/09/17)  602-853-3048 225   225    Blood  300  300          EBL (mL)  300  300        Shift Total  1310 900 2210 225   225   NET 20 -310 -861.5 -1151.5 165.3   165.3       Physical Exam:  General: alert, cooperative, no distress  Chest: Lungs:clear to auscultation bilaterally  Cardiac: regular rate and rhythm, S1, S2 normal, no murmur, click, rub or gallop  Abdomen: soft, non-tender; bowel sounds normal; no masses,  no organomegaly

## 2017-02-10 NOTE — Progress Notes
checked and tightened 02/10/2017  8:00 AM   Color/Movement/Sensation Capillary refill less than 3 sec 02/10/2017  8:00 AM   Dressing Type Occlusive;Transparent;Antimicrobial disc 02/10/2017  8:00 AM   Dressing Status Clean;Intact;Dry 02/10/2017  8:00 AM   Dressing Change Due 02/16/17 02/10/2017  8:00 AM       Peripheral IV 02/07/17 Left Anterior Antecubital (Active)   Site Assessment Clean;Dry;Intact 02/10/2017  8:00 AM   Line Status Infusing 02/10/2017  8:00 AM   Line Care Connections checked and tightened 02/10/2017  8:00 AM   Dressing Type Occlusive;Transparent 02/10/2017  8:00 AM   Dressing Status Clean;Dry;Intact 02/10/2017  8:00 AM   Dressing Change Due 02/13/17 02/10/2017  8:00 AM   IV Infiltration Grading Scale 0 02/10/2017  8:00 AM   Visual Infusion Phlebitis 0 02/10/2017  8:00 AM   Reason Not Rotated Not due 02/10/2017  8:00 AM       Peripheral IV 02/09/17 Right Distal Forearm (Active)   Site Assessment Clean;Dry;Intact 02/10/2017  8:00 AM   Line Status Infusing 02/10/2017  8:00 AM   Line Care Connections checked and tightened;Alcohol caps changed 02/10/2017  8:00 AM   Dressing Type Occlusive;Transparent 02/10/2017  8:00 AM   Dressing Status Clean;Dry;Intact 02/10/2017  8:00 AM   Dressing Change Due 02/16/17 02/10/2017  8:00 AM   IV Infiltration Grading Scale 0 02/10/2017  8:00 AM   Visual Infusion Phlebitis 0 02/10/2017  8:00 AM   Reason Not Rotated Not due 02/10/2017  8:00 AM          Drains:  Urethral Catheter 02/09/17 (Active)   Meets criteria for insertion/continued use yes 02/10/2017  8:00 AM   Site Assessment Clean;Dry;Intact 02/10/2017  8:00 AM   Status Patent 02/10/2017  8:00 AM   Collection Container Standard drainage bag 02/10/2017  8:00 AM   Securement Method Tape 02/10/2017  8:00 AM   Output (mL) 100 mL 02/10/2017  8:00 AM       Foley Present: No    CURRENT HOSPITAL MEDICATIONS:   Scheduled:   ? acetaminophen  1,000 mg Oral Q6H   ? allopurinol  100 mg Oral daily   ? aluminum-magnesium-simethicone hydroxide  30 mL Oral TID W/ Meals

## 2017-02-10 NOTE — Consults
Supervision (S). Gait Quality: decreased cadence, slight unsteadiness. cues for upright posture, self-monitioring of fatigue levels and feelings of hypotension.  ? PATIENT AND/OR CAREGIVER EDUCATION: Patient/family educated on safe mobility with ambulation with RW and need for rest breaks. , fall prevention strategies and safe home set-up for safe mobility     Post Treatment:   Patient Position/Safety: Sitting in recliner, Call Bell within reach, Tray table within reach, All lines and leads intact, Family Present , Handoff to nurse on patient position    Assessment: Patient observed supervision with sit to stand from recliner chair with no complaints of dizziness however stated feeling unsteady, HHAx1 provided. Pt ambulated using RW 2/2 to slight unsteadiness and pt reports of LE weakness/soreness following TAG placement on 02/09/17 with groin access.  Plan for PT 1-2 sessions to trail ambulation with and without RW to determine possible need for AD 2/2 safety concerns. Pt did not use DME prior to recent hospital admission. Recommend discharge home with intermittent assistance when medically cleared.    Problem list:  ? impaired transfers  ? impaired gait  ? impaired judgment  ? increased risk for falls  ? decreased activity tolerance    Other Recommended Services: None     Discharge Recommendations:   ? Discharge Disposition: Home with intermittent caregiver supervision    ? DME Recommendations:  1. RW today, may progress to no AD, will trial no AD next session     **Note: Discharge recommendations may change based on patient progress. Please refer to the most updated progress note for current discharge recommendations.     Plan of Care: Patient will be seen 4-5 times per week for gait training, therapeutic activities, therapeutic exercise, balance, activity tolerance and safety.    PT Evaluation Complexity   History Examination Clinical Presentation Decision Making

## 2017-02-10 NOTE — Progress Notes
Pt denies any numbness and tingling and palpable.

## 2017-02-10 NOTE — Consults
moderate (1-2 personal factors/comorbidities) low (addressing 1-2 elements) low (stable) low (using standardized patient assessment instrument and/or measureable assessment of functional outcome)   PT Evaluation Complexity Charge: low     **Co-Treatment: NA  _________________________   Ashley Wilcock, SPT   02/10/2017

## 2017-02-10 NOTE — Progress Notes
Pt denies any numbness and tingling and palpable.

## 2017-02-10 NOTE — Consults
?   TREATMENT TIME: 8 min  ? ADLs: pt simulated toilet transfer with CGA for balance.     Post Treatment:   Patient Position/safety: Sitting in recliner, Call Bell within reach, Tray table within reach, All lines and leads intact, Handoff to nurse on patient position    Assessment: Patient is Min A-Mod I with all ADLs, mobility, and transfers. Patient is WFL with UE ROM/strength, cognition, coordination, sensation, and vision. No skilled OT services indicated at this time.    Discharge Recommendations:   ? Discharge Disposition: Home with intermittent caregiver supervision    ? DME Recommendations:  1. defer mobility device to PT    Plan: Follow up OT    OT Evaluation Complexity   History Examination Decision Making   low (brief review of history related to the presenting problem) low (addressing 1-3 performance deficits) moderate (detailed assessment, minimal to moderate modification of assessments, co-morbidities are present)   OT Evaluation Complexity Charge: low     **Co-Treatment: NA  ____________________________   Grace Anglim, OT   02/10/2017

## 2017-02-10 NOTE — Plan of Care
Problem: Pain, Acute & Chronic  Goal: Pain is relieved/acceptable level with minimal side effects  Outcome: Ongoing  Patient educated on pain scale and pain management, both pharmacologic and non-pharmacologic, patient reports attenuation of pain with current regimen.    Problem: Risk for Injury R/T Falls  Goal: Prevent falls  Outcome: Ongoing  Environment assessed for and cleared of hazards, fall precautions in place, fall risk education provided to patient and family, non-slip footwear applied, bed/chair alarm activated per unit policy, call bell within reach. Will continue to monitor.

## 2017-02-10 NOTE — Consults
Supervision (S). Gait Quality: decreased cadence, slight unsteadiness. cues for upright posture, self-monitioring of fatigue levels and feelings of hypotension.  ? PATIENT AND/OR CAREGIVER EDUCATION: Patient/family educated on safe mobility with ambulation with RW and need for rest breaks. , fall prevention strategies and safe home set-up for safe mobility     Post Treatment:   Patient Position/Safety: Sitting in recliner, Call Bell within reach, Tray table within reach, All lines and leads intact, Family Present , Handoff to nurse on patient position    Assessment: Patient observed supervision with sit to stand from recliner chair with no complaints of dizziness however stated feeling unsteady, HHAx1 provided. Pt ambulated using RW 2/2 to slight unsteadiness and pt reports of LE weakness/soreness following TAG placement on 02/09/17 with groin access.  Plan for PT 1-2 sessions to trail ambulation with and without RW to determine possible need for AD 2/2 safety concerns. Pt did not use DME prior to recent hospital admission. Recommend discharge home with intermittent assistance when medically cleared.    Problem list:  ? impaired transfers  ? impaired gait  ? impaired judgment  ? increased risk for falls  ? decreased activity tolerance    Other Recommended Services: None     Discharge Recommendations:   ? Discharge Disposition: Home with intermittent caregiver supervision    ? DME Recommendations:  1. RW today, may progress to no AD, will trial no AD next session     **Note: Discharge recommendations may change based on patient progress. Please refer to the most updated progress note for current discharge recommendations.     Plan of Care: Patient will be seen 4-5 times per week for gait training, therapeutic activities, therapeutic exercise, balance, activity tolerance and safety.    PT Evaluation Complexity   History Examination Clinical Presentation Decision Making

## 2017-02-10 NOTE — Plan of Care
Problem: Pain, Acute & Chronic  Goal: Pain is relieved/acceptable level with minimal side effects  Outcome: Ongoing  Patient educated on pain scale and pain management, both pharmacologic and non-pharmacologic, patient reports attenuation of pain with current regimen.    Problem: Risk for Injury R/T Falls  Goal: Prevent falls  Outcome: Ongoing  Environment assessed for and cleared of hazards, fall precautions in place, fall risk education provided to patient and family, non-slip footwear applied, bed/chair alarm activated per unit policy, call bell within reach. Will continue to monitor.

## 2017-02-10 NOTE — Progress Notes
IR Note:    The patient is doing well this morning, up to chair. He is status post TAG POD#1. Mild hypotension ON requiring pressor support. No complaints other than a desire for Foley cathter removal.     Left CFA artery access site dressing is c/d/i, mild ecchymosis, no swelling or tenderness.    Right access site with overlying wound vac.    Distal pulses are 2 + b/l DP.

## 2017-02-10 NOTE — Progress Notes
Department of Surgery  Division of Cardiothoracic Surgery  Post-op Progress Note        Admission Date: 02/07/2017   Surgery Date: 02/09/2017   Post-Op Day: 1 Day Post-Op    Pre-Op Diagnosis Codes: Penetrating atherosclerotic ulcer of aorta (CMS-HCC code) [I70.0]  Post-Op Diagnosis Codes:     * Penetrating atherosclerotic ulcer of aorta (CMS-HCC code) [I70.0]    Operations and Major Procedures:     Procedure(s):  THORACIC ENDOVASCULAR ANEURYSM REPAIR (TEVAR) (N/A)  -------------------  Event Time In   Out of Room 02/09/2017 1708       Brief History: [place] with penetrating uler of the descending aorta    Subjective: Patient is OOB in the chair. No neurological disfunction.    Airway:      RA       Hemodynamic Monitoring:   Temp  36.7 ...   36.8 ...   36.3 ...      Temp      Temp Source   Oral   Oral        Temp Source     Heart Rate (monitor)  76 68 75 75 86 75 84 85 85 84  99  Heart Rate (monitor)     Rhythm  NSR    NSR    NSR    Rhythm     Resp rate  18 14 13 12 21 15 20 13 20 20 21   Resp rate     BP (cuff)  108/55 110/61 109/53 108/60 118/61 111/60 114/63 105/59 118/68 113/63 93/57  BP (cuff)     MAP (cuff)  81 76 73 79 80 82 74 82 92 73  73  MAP (cuff)     BP (a-line)  107/40 106/41 106/41 121/49 126/51 123/47 93/43 103/41 101/46 120/47 94/37  BP (a-line)     MAP (a-line)  57 59 59 69 70 68 57 58 62 66  62             Intake/Output Summary (Last 24 hours) at 02/10/17 1110  Last data filed at 02/10/17 0800   Gross per 24 hour   Intake          1038.53 ml   Output             2310 ml   Net         -1271.47 ml          LINES AND DRAINS:   Lines:  Arterial Line 02/09/17 Right Radial (Active)   Site Assessment Dry;Clean;Intact 02/10/2017  8:00 AM   Arterial Line Status Positional;Pulsatile blood flow 02/10/2017  8:00 AM   Arterial Line Waveform Appropriate;Square wave test performed 02/10/2017  8:00 AM   Arterial Line Interventions Zeroed and calibrated;Leveled;Connections

## 2017-02-10 NOTE — Consults
Physical Therapy Evaluation      Start Time (min): 8:41  End Time (min): 9:01  Total Time (min): 20  Room/Bed: 530/530-01    History of Present Illness: Riley Evans is a 72 y.o. male admitted on 02/07/2017 chest pain. S/p TAG placement with exclusion of penetrating ulcer of proximal descending aorta on 02/09/17. PMHx CAD s/p CABG 2014, GERD, HTN, DM.        ICD-10-CM ICD-9-CM   1. Penetrating ulcer of aorta (CMS-HCC code) I71.9 441.9   2. Chest pain in adult R07.9 786.50   3. Type 2 diabetes mellitus without complication, unspecified whether long term insulin use (CMS-HCC code) E11.9 250.00   4. Hypertension, unspecified type I10 401.9     Past Medical History:   Diagnosis Date   ? Diabetes mellitus (CMS-HCC code)    ? GERD (gastroesophageal reflux disease)    ? Hypertension      Past Surgical History:   Procedure Laterality Date   ? APPENDECTOMY     ? CARDIAC SURGERY         Precautions:  ? FALL  ? Cardiac    Extremity Precautions:  ? None indicated    Orthotic, Protective, & Supportive Devices:  ? None    Living Environment/Function:  ? Lives: with spouse  Home assistance: 24/7  ? Lives in: house:  one story  ? Stair/steps to enter: 0  ? Home DME: does not use equipment  ? Prior Level of Function: was independent with self-care, transfers, ambulation, household tasks and/or recreational activities    Subjective: Pt agreeable to session, spouse present in room.   ? Pain Pre-Treatment: Pt reported slight discomfort in groin 2/2 foley cathter   ? Pain Post-Treatment: No, 0/10    **Was an interpreter used: NA    Examination:    Observation: Pt recieved in recliner chair connected to ICU leads and lines, Right wound vac, foley. Pt left as recieved.     Vitals: Vitals stable    Cognition:   ? Alert and Oriented to Person, Place, Time and Situation  ? Command Following: Follows Multi-step commands    Right Upper Extremity:  ? Within Functional Limits [facility](WFL) for AROM/PROM and strength    Left Upper Extremity:

## 2017-02-10 NOTE — Progress Notes
?   aspirin  81 mg Oral daily   ? atorvastatin  40 mg Oral Nightly   ? cyclobenzaprine  5 mg Oral TID   ? gabapentin  600 mg Oral TID   ? heparin  5,000 [facility]   ? insulin aspart  0-3 Units Subcutaneous TID AC   ? losartan  25 mg Oral daily   ? melatonin  6 mg Oral Nightly   ? pantoprazole  40 mg Oral BID AC       Continuous Infusions:   ? 0.9 % NaCl Stopped (02/09/17 1842)   ? nitroGLYCERIN     ? norepinephrine 1 mcg/min (02/10/17 1028)   ? vasopressin (PITRESSIN) infusion Stopped (02/10/17 0930)       PRN:   acetaminophen 650 mg Oral Q4H PRN   bisacodyl 10 mg Oral Daily PRN   dextrose 30 mL Intravenous PRN   enalaprilat 0.625 mg Intravenous Q6H PRN   fentaNYL 25 mcg Intravenous Q1H PRN   glucose 16 g Oral PRN   ipratropium-albuterol 3 mL Nebulization Q4H PRN   ondansetron 4 mg Intravenous Q6H PRN   ondansetron 4 mg Oral Q6H PRN   oxyCODONE 5 mg Oral Q4H PRN   polyethylene glycol 17 g Oral BID PRN   traZODone 50 mg Oral Nightly PRN       PROBLEM LIST:     Active Hospital Problems    Diagnosis Date Noted   ? Chest pain 02/07/2017      Resolved Hospital Problems    Diagnosis Date Noted Date Resolved   No resolved problems to display.       PHYSICAL EXAM:   Vitals:   Vitals:    02/10/17 0830 02/10/17 0900 02/10/17 0930 02/10/17 1000   BP: 118/68 133/73 113/63 93/57   Pulse:       Resp: 20 22 20 21    Temp:       TempSrc:       SpO2: 97% 98% 95% 97%   Weight:       Height:         General: alert, cooperative, no distress, appears stated age.  Lungs: clear to auscultation bilaterally.  Heart:: Regular rate and rhythm.  Abdomen: Soft, non-tender, bowel sounds normal, no masses, no organomegaly.  Extremities: Warm, no edema and wound clean and dry with the Prreveena dressing in place..  Pulses: 2+ and symmetric.  Skin: Skin color, texture, turgor normal. No rashes or lesions.  Neurologic: Normal:  grossly non-focal, gait normal, strength 5/5, sensation and full, no focal deficits.      LABS:

## 2017-02-10 NOTE — Progress Notes
IR Note:    The patient is doing well this morning, up to chair. He is status post TAG POD#1. Mild hypotension ON requiring pressor support. No complaints other than a desire for Foley cathter removal.     Left CFA artery access site dressing is c/d/i, mild ecchymosis, no swelling or tenderness.    Right access site with overlying wound vac.    Distal pulses are 2 + b/l DP.

## 2017-02-10 NOTE — Progress Notes
Extremities: no edema, redness or tenderness in the calves or thighs  Right groin site with wound VAC in place, ecchymosis noted at the site - left groin access site with mild bruising but no active bleeding noted    Data Review:   Recent Labs      02/08/17   0444  02/09/17   0513  02/09/17   1838  02/10/17   0104   WBC  6.21  6.72  5.29  9.59   HGB  15.3  15.2  14.0  13.1*   PLATCOUNT  101*  115*  96*  110*   MCV  93.1  94.9  95.5  94.0     Recent Labs      02/08/17   0444  02/09/17   0513  02/09/17   1838  02/10/17   0104   NA  145  138  140  138   K  4.0  4.2  4.3  4.6   BUN  13  19  18  18    CREATININE  1.05  1.15  0.97  0.97   GLU  125*  136*  147*  169*   CALCIUM  9.0  9.0  8.2*  8.1*     Recent Labs      02/09/17   0513  02/10/17   0104   AST  26  24   ALT  32  27     No results for input(s): CKMB in the last 72 hours.    Invalid input(s): TROPONIN  Recent Labs      02/09/17   0513   INR  1.1       [facility] [place] reviewed 2.6.19 and shows equivocal MPI 6/18 and pt referred for CT coronaries, pt do not remember completing it - encouraged to fu with his cards as an OP after dc       Assessment and Plan:       Pt with hx of hypertension, hyperlipidemia, diabetes mellitus type 2, gastroesophageal reflux disease, coronary disease status post CABG 2014 presented to [facility] room with complaints of chest pain. Had a CTA of the chest in the emergency room to rule out any pulmonary embolism, no PE noted on CTA but there is a concern of her penetrating ulcer in the aorta and so [facility] and cardiac thoracic surgery was consulted      ? 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 Chest pain, atypical - ACS rule out with troponins and EKG - patient

## 2017-02-10 NOTE — Progress Notes
Spoke with MD Sylvester in regards to the pt BP being below the desired parameters, which are document on the flow sheet.  New orders received and waiting for pharmacy to tube medication.  Will continue to monitor and follow the plan of care.

## 2017-02-10 NOTE — Progress Notes
?   aspirin  81 mg Oral daily   ? atorvastatin  40 mg Oral Nightly   ? cyclobenzaprine  5 mg Oral TID   ? gabapentin  600 mg Oral TID   ? heparin  5,000 Units Subcutaneous Q12H [facility]   ? insulin aspart  0-3 Units Subcutaneous TID AC   ? losartan  25 mg Oral daily   ? melatonin  6 mg Oral Nightly   ? pantoprazole  40 mg Oral BID AC       Continuous Infusions:   ? 0.9 % NaCl Stopped (02/09/17 1842)   ? nitroGLYCERIN     ? norepinephrine 1 mcg/min (02/10/17 1028)   ? vasopressin (PITRESSIN) infusion Stopped (02/10/17 0930)       PRN:   acetaminophen 650 mg Oral Q4H PRN   bisacodyl 10 mg Oral Daily PRN   dextrose 30 mL Intravenous PRN   enalaprilat 0.625 mg Intravenous Q6H PRN   fentaNYL 25 mcg Intravenous Q1H PRN   glucose 16 g Oral PRN   ipratropium-albuterol 3 mL Nebulization Q4H PRN   ondansetron 4 mg Intravenous Q6H PRN   ondansetron 4 mg Oral Q6H PRN   oxyCODONE 5 mg Oral Q4H PRN   polyethylene glycol 17 g Oral BID PRN   traZODone 50 mg Oral Nightly PRN       PROBLEM LIST:     Active Hospital Problems    Diagnosis Date Noted   ? Chest pain 02/07/2017      Resolved Hospital Problems    Diagnosis Date Noted Date Resolved   No resolved problems to display.       PHYSICAL EXAM:   Vitals:   Vitals:    02/10/17 0830 02/10/17 0900 02/10/17 0930 02/10/17 1000   BP: 118/68 133/73 113/63 93/57   Pulse:       Resp: 20 22 20 21   Temp:       TempSrc:       SpO2: 97% 98% 95% 97%   Weight:       Height:         General: alert, cooperative, no distress, appears stated age.  Lungs: clear to auscultation bilaterally.  Heart:: Regular rate and rhythm.  Abdomen: Soft, non-tender, bowel sounds normal, no masses, no organomegaly.  Extremities: Warm, no edema and wound clean and dry with the Prreveena dressing in place..  Pulses: 2+ and symmetric.  Skin: Skin color, texture, turgor normal. No rashes or lesions.  Neurologic: Normal:  grossly non-focal, gait normal, strength 5/5, sensation and full, no focal deficits.      LABS:

## 2017-02-10 NOTE — Progress Notes
Spoke with MD Jacqlyn KraussSylvester in regards to the pt BP being below the desired parameters, which are document on the flow sheet.  New orders received and waiting for pharmacy to tube medication.  Will continue to monitor and follow the plan of care.

## 2017-02-10 NOTE — Progress Notes
Department of Medicine  General Internal Medicine  Hospitalist Service      Admit Date:02/07/2017  9:46 AM   LOS: 3 days    History of Present Illness:     Patient completed TEVAR yesterday - denies any new issues - No bleeding from the groin site, no further chest pains        REVIEW OF SYSTEMS:    All other systems reviewed and are negative except those stated above.     Current Hospital Medications:  Scheduled:   ? acetaminophen  1,000 mg Oral Q6H   ? allopurinol  100 mg Oral daily   ? aluminum-magnesium-simethicone hydroxide  30 mL Oral TID W/ Meals   ? aspirin  81 mg Oral daily   ? atorvastatin  40 mg Oral Nightly   ? cyclobenzaprine  5 mg Oral TID   ? gabapentin  600 mg Oral TID   ? heparin  5,000 Units Subcutaneous Q12H [facility]   ? insulin aspart  0-3 Units Subcutaneous TID AC   ? losartan  25 mg Oral daily   ? melatonin  6 mg Oral Nightly   ? pantoprazole  40 mg Oral BID AC      ? 0.9 % NaCl Stopped (02/09/17 1842)   ? nitroGLYCERIN     ? norepinephrine Stopped (02/10/17 1130)   ? vasopressin (PITRESSIN) infusion Stopped (02/10/17 0930)     acetaminophen, bisacodyl, dextrose, enalaprilat, fentaNYL, glucose, ipratropium-albuterol, ondansetron, ondansetron **OR** [DISCONTINUED] ondansetron, oxyCODONE, polyethylene glycol, traZODone      Physical Exam:     Patient Vitals for the past 24 hrs:   BP Temp Temp src Pulse Resp SpO2   02/10/17 1330 120/49 - - 94 18 97 %   02/10/17 1305 - - - 100 15 96 %   02/10/17 1300 105/57 - - 99 16 96 %   02/10/17 1240 - - - 102 16 96 %   02/10/17 1230 132/53 - - 99 16 96 %   02/10/17 1218 - - - - 24 97 %   02/10/17 1217 - - - - 19 98 %   02/10/17 1216 - - - - 18 98 %   02/10/17 1210 - - - - 20 97 %   02/10/17 1200 116/57 - - - 19 98 %   02/10/17 1130 133/60 - - 91 21 98 %   02/10/17 1100 108/63 37 ?C (98.6 ?F) - 93 20 97 %   02/10/17 1030 123/59 - - - 17 96 %   02/10/17 1010 - - - - 22 95 %   02/10/17 1000 93/57 - - - 21 97 %   02/10/17 0930 113/63 - - - 20 95 %

## 2017-02-10 NOTE — Progress Notes
Spoke with MD sylvester in regards to pt BP and the parameters, I was told to maintain a SBP between 130-170. I made him aware that the cuff pressure was reading lower than the desired parameters and asked if I could go by the arterial line reading, which is within the goal and he replied,  it is okay to take the arterial  line reading.  Will continue to monitor and follow the plan of care.

## 2017-02-10 NOTE — Anesthesia Postprocedure Evaluation
Post Anesthesia Eval    Respiratory function appropriate( normal respiratory rate and oxygen saturation, airway patent )  Cardiovascular function appropriate( normal heart rate and blood pressure )  Mental status appropriate  Patient normothermic  Pain well controlled  No uncontrolled nausea and vomiting  Patient appropriately hydrated  No apparent anesthesia complications  Patient tolerated procedure well  Pt to ICU, monitored, after extubation

## 2017-02-10 NOTE — Progress Notes
Patient denies any numbness and tingling and I am able to palpate pulses.  Pt can move all extremities.

## 2017-02-10 NOTE — Progress Notes
Charge RN Debra spoke with MD Pirris and informed him of the pt BP being blow the desired parameters.  Vasopressin was started per order and Levophed has been started.  MD Pirris stated that is was okay to start the levophed but do not exceed mcg.  To add the pt BP continues to be below the desired values.  Notified MD Jacqlyn KraussSylvester of MD Pirris orders, and there are no new orders at this time.  Will continue to monitor and follow the plan of care.

## 2017-02-10 NOTE — Progress Notes
Extremities: no edema, redness or tenderness in the calves or thighs  Right groin site with wound VAC in place, ecchymosis noted at the site - left groin access site with mild bruising but no active bleeding noted    Data Review:   Recent Labs      02/08/17   0444  02/09/17   0513  02/09/17   1838  02/10/17   0104   WBC  6.21  6.72  5.29  9.59   HGB  15.3  15.2  14.0  13.1*   PLATCOUNT  101*  115*  96*  110*   MCV  93.1  94.9  95.5  94.0     Recent Labs      02/08/17   0444  02/09/17   0513  02/09/17   1838  02/10/17   0104   NA  145  138  140  138   K  4.0  4.2  4.3  4.6   BUN  13  19  18  18   CREATININE  1.05  1.15  0.97  0.97   GLU  125*  136*  147*  169*   CALCIUM  9.0  9.0  8.2*  8.1*     Recent Labs      02/09/17   0513  02/10/17   0104   AST  26  24   ALT  32  27     No results for input(s): CKMB in the last 72 hours.    Invalid input(s): TROPONIN  Recent Labs      02/09/17   0513   INR  1.1       records from his primary cardiologist [Dr. Khan at [place]] reviewed 2.6.19 and shows equivocal MPI 6/18 and pt referred for CT coronaries, pt do not remember completing it - encouraged to fu with his cards as an OP after dc       Assessment and Plan:       Pt with hx of hypertension, hyperlipidemia, diabetes mellitus type 2, gastroesophageal reflux disease, coronary disease status post CABG 2014 presented to [facility] with complaints of chest pain. Had a CTA of the chest in the emergency room to rule out any pulmonary embolism, no PE noted on CTA but there is a concern of her penetrating ulcer in the aorta and so [facility] and cardiac thoracic surgery was consulted      ? 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 Chest pain, atypical - ACS rule out with troponins and EKG - patient

## 2017-02-10 NOTE — Progress Notes
Patient denies any numbness and tingling and I am able to palpate pulses.  Pt can move all extremities.

## 2017-02-10 NOTE — Consults
Occupational Therapy Evaluation      Start Time (min): 1050  End Time (min): 1106  Total Time (min): 16  Room/Bed: 530/530-01    Occupational Profile: Riley Evans is a 72 y.o. male admitted on 02/07/2017 chest pain. S/p TAG placement with exclusion of penetrating ulcer of proximal descending aorta on 02/09/17. PMHx CAD s/p CABG 2014, GERD, HTN, DM.      ICD-10-CM ICD-9-CM   1. Penetrating ulcer of aorta (CMS-HCC code) I71.9 441.9   2. Chest pain in adult R07.9 786.50   3. Type 2 diabetes mellitus without complication, unspecified whether long term insulin use (CMS-HCC code) E11.9 250.00   4. Hypertension, unspecified type I10 401.9       Precautions:  ? FALL  ? Cardiac    SBP goal 130-170    Extremity Precautions:  ? None indicated    Orthotic, Protective, & Supportive Devices:  ? None    PLOF: Pt lives with spouse in one story house and used no DME at baseline. Pt retired but Ind with ADLs and IADLs at baseline.     Subjective: Pt agreeable to OT; RN consent.    **Was an interpreter used: NA    Pain: 1/10 reported in R groin    Vitals: BP 108/63 seated, 130/61 seated after mobility    Observations: pt seated in recliner, ICU lines, WVAC, IVF.  Vision: WFL  Cognition: WFL  Coordination: WFL  Sensation: WFL  UE ROM/Strength: WFL    Bed Mobility: Supine to/from Sit: pt already OOBTC  Functional Transfers: Sit <> Stand: Modified Independent  Functional Mobility: pt mobile with [id number] and supervision    Balance:   ? Sitting: Static  Independent (I), Dynamic  Modified Independent (Mod I)  ? Standing: Static  Supervision (S), Dynamic  Supervision (S)    ADLs: pt Min A-Mod I with ADLs at this time.    Patient/Caregiver Education: Pt educated to complete ADLs seated compared to standing as needed for safety. Pt educated spread out demanding tasks throughout the day to prevent fatigue.     Outcome Measures:  ? AM-PAC "6-clicks" Short Form: Raw Score: 22. AM-PAC Score: 47.10. CMS Score: 25.02% - CJ    Treatment on Evaluation

## 2017-02-10 NOTE — Progress Notes
Read By - Sudha Bogineni-Misra    Electronically Verified By - Sudha Bogineni-Misra    Released Date Time - 02/10/2017 9:48 AM    Resident - Amie Leon         OVERALL ASSESSMENT:   Doing well. SBP has been soft and he was placed of vaso and levo to increase the spinal perfusion. This is weaning and SBP staying satisfactory this AM. His lower extremeties have normal function.  Plan to DC the foley, art line and IV's. Increase activity and DC to home tomorrow.      PLAN:     Neuro:  Alert, Orientated x3, Follows commands     Resp:  Supplemental O2: NC  To be weaned  CV:  Rate/Rhythm- 24 hours: NSR    GI/Fluid Electrolyte Nutrition:  Diet Orders (1440h ago through future)    Start     Ordered    02/10/17 0715  Diet (cardiac fat,chol,sod,caff rstrctd)  DIET EFFECTIVE NOW     Process Instructions:  Low Cholesterol, Low Fat, Low Sodium, Caffiene Restricted       02/10/17 0709         Nutrition Feed/Supplements (Through next 720h)    None        Nausea:  No    GI:  BMI: Body mass index is 28.75 kg/m?.  tolerating diet    Monitor Electrolytes and Replace as Needed:  Monitor I&O's: Hypovolemia  Fluid & Electrolyte Imbalances: Replete as needed  Dietary Supplements: Oral    HEME:  Continue to Follow: Platelet count    ID:  Temp (24hrs), Avg:36.6 ?C (97.8 ?F), Min:36.3 ?C (97.4 ?F), Max:36.8 ?C (98.3 ?F)    Cultures: No results found for this visit on 02/07/17.  Afebrile: No    Nosocomial Infection Prevention:  Following Standard Hospital Hygiene Protocol    GU:  Voiding on own    Endo:  No glycemic control required    Extremities:  Normal function and vascular status.    Mobility: Ambulating halls    Social: Family support    Code Status: Full code    Dispo: Intermediate  1. Anticipated D/C to Home  2. Case Manager Consultation: Yes    Noel Masters Perry  02/10/2017 11:10 AM

## 2017-02-10 NOTE — Progress Notes
Informed MD Sylvester about patients BP being below the desired parameters.  New orders received.  Will continue to monitor and follow the plan of care.

## 2017-02-10 NOTE — Progress Notes
checked and tightened 02/10/2017  8:00 AM   Color/Movement/Sensation Capillary refill less than 3 sec 02/10/2017  8:00 AM   Dressing Type Occlusive;Transparent;Antimicrobial disc 02/10/2017  8:00 AM   Dressing Status Clean;Intact;Dry 02/10/2017  8:00 AM   Dressing Change Due 02/16/17 02/10/2017  8:00 AM       Peripheral IV 02/07/17 Left Anterior Antecubital (Active)   Site Assessment Clean;Dry;Intact 02/10/2017  8:00 AM   Line Status Infusing 02/10/2017  8:00 AM   Line Care Connections checked and tightened 02/10/2017  8:00 AM   Dressing Type Occlusive;Transparent 02/10/2017  8:00 AM   Dressing Status Clean;Dry;Intact 02/10/2017  8:00 AM   Dressing Change Due 02/13/17 02/10/2017  8:00 AM   IV Infiltration Grading Scale 0 02/10/2017  8:00 AM   Visual Infusion Phlebitis 0 02/10/2017  8:00 AM   Reason Not Rotated Not due 02/10/2017  8:00 AM       Peripheral IV 02/09/17 Right Distal Forearm (Active)   Site Assessment Clean;Dry;Intact 02/10/2017  8:00 AM   Line Status Infusing 02/10/2017  8:00 AM   Line Care Connections checked and tightened;Alcohol caps changed 02/10/2017  8:00 AM   Dressing Type Occlusive;Transparent 02/10/2017  8:00 AM   Dressing Status Clean;Dry;Intact 02/10/2017  8:00 AM   Dressing Change Due 02/16/17 02/10/2017  8:00 AM   IV Infiltration Grading Scale 0 02/10/2017  8:00 AM   Visual Infusion Phlebitis 0 02/10/2017  8:00 AM   Reason Not Rotated Not due 02/10/2017  8:00 AM          Drains:  Urethral Catheter 02/09/17 (Active)   Meets criteria for insertion/continued use yes 02/10/2017  8:00 AM   Site Assessment Clean;Dry;Intact 02/10/2017  8:00 AM   Status Patent 02/10/2017  8:00 AM   Collection Container Standard drainage bag 02/10/2017  8:00 AM   Securement Method Tape 02/10/2017  8:00 AM   Output (mL) 100 mL 02/10/2017  8:00 AM       Foley Present: No    CURRENT HOSPITAL MEDICATIONS:   Scheduled:   ? acetaminophen  1,000 mg Oral Q6H   ? allopurinol  100 mg Oral daily   ? aluminum-magnesium-simethicone hydroxide  30 mL Oral TID W/ Meals

## 2017-02-11 MED ORDER — CYCLOBENZAPRINE HCL 5 MG PO TABS
5 mg | Freq: Three times a day (TID) | ORAL | 0 refills | Status: CP
Start: 2017-02-11 — End: ?

## 2017-02-11 NOTE — Discharge Instr - AVS First Page
Physical Therapy Discharge Instructions    Please walk around several times daily. Please follow fall prevention strategies at home, as recommended by your therapist if one was recommended, clear house of clutter and tripping hazards to prevent falls, follow MD recommendations for when to return to the hospital or follow up appointments.      Your in-hospital Physical Therapist was Travis, PT who can be reached at [phone].  If you need to contact outpatient physical therapy services, please contact [phone].      Occupational Therapy Discharge Instructions    You may need help with activities as needed once you go home. Your Occupational Therapist was Grace, OT who can be reached at [phone].      Home Health care has been arranged for your continued recovery. When you get home from the hospital, please contact [facility] at [phone] to arrange the start of your care.

## 2017-02-11 NOTE — Consults
Consult received. Will [facility].

## 2017-02-11 NOTE — Progress Notes
?   Discharge Disposition: Home with intermittent caregiver supervision    ? DME Recommendations:  1. None    **Note: Discharge recommendations may change based on patient progress. Please refer to the most updated progress note for current discharge recommendations.     Post Treatment:   Patient Position/Safety: Sitting in recliner, Call Bell within reach, Tray table within reach, All lines and leads intact, Handoff to nurse on patient position    Plan: Patient will be discharged from PT services.    **Co-Treatment: NA  _________________________   Travis Wylie, PT   02/11/2017

## 2017-02-11 NOTE — Progress Notes
Report given to Serena ColonelMatt H., RN. Transition of care completed.

## 2017-02-11 NOTE — Discharge Instr - AVS First Page
Physical Therapy Discharge Instructions    Please walk around several times daily. Please follow fall prevention strategies at home, as recommended by your therapist if one was recommended, clear house of clutter and tripping hazards to prevent falls, follow MD recommendations for when to return to the hospital or follow up appointments.      Your in-hospital Physical Therapist was Travis, PT who can be reached at [phone].  If you need to contact outpatient physical therapy services, please contact [phone].      Occupational Therapy Discharge Instructions    You may need help with activities as needed once you go home. Your Occupational Therapist was Grace, OT who can be reached at [phone].      Home Health care has been arranged for your continued recovery. When you get home from the hospital, please contact [facility] at [phone] to arrange the start of your care.      The patient/parent/caregiver has received the patient's instructions, belongings, valuables, home medications and prescriptions and has been instructed to bring this form to his/her next doctor visit. The patient was given information about the flu and/or pneumonia vaccines and the opportunity to receive them.     Current telephone number for post discharge phone call: [phone]  Mode of Transportation: Car  Discharged With/To Care of: Spouse    Signed by Matthew W Hutchinson, RN  02/11/2017 11:32 AM

## 2017-02-11 NOTE — Progress Notes
?   Discharge Disposition: Home with intermittent caregiver supervision    ? DME Recommendations:  1. None    **Note: Discharge recommendations may change based on patient progress. Please refer to the most updated progress note for current discharge recommendations.     Post Treatment:   Patient Position/Safety: Sitting in recliner, Call Bell within reach, Tray table within reach, All lines and leads intact, Handoff to nurse on patient position    Plan: Patient will be discharged from PT services.    **Co-Treatment: NA  _________________________   Hurshel Keysravis Wylie, PT   02/11/2017

## 2017-02-11 NOTE — Consults
Consult received. [facility]     Department of Case Management  Discharge Planning Progress Note       NAME: Riley Evans    MRN: [id number]    AGE: 72 y.o. DOB: November 18, 1945 Date of Admission: 02/07/2017    Payor: Payor: MEDICARE AETNA / Plan: MEDICARE AETNA HMO / Product Type: HMO/POS /     Advance Directive/Healthcare Decision Maker: Advance Directive/Healthcare Decision Maker:  (refused) (02/07/17 1618)  Legal Documentation: Legal documentation:  (None on file) (02/09/17 1236)    Current Medical Status: stable    The patient is currently: inpatient    Case Manager Assessment/Actions Taken: Rounded with CT Surgery. Patient to discharge today. HHC has been set up through [facility]. Services will start on Sunday. Dr. Job FoundsPirris to follow [facility] since [place] in [place]. PT is recommending a walker. Per patient, he will get a walker on his own if he determines he needs it.    Potential barriers and planned intervention: none    Interdisciplinary updates: see above    Anticipated discharge plan[facility]    Expected discharge date: 02/11/2017    Elveria RoyalsRenee E Roemer, RN    02/11/2017 9:40 AM

## 2017-02-11 NOTE — Consults
Consult received. Will submit orders through [facility] to [facility].

## 2017-02-11 NOTE — Progress Notes
Pt given discharge paperwork and instructions. Pt in NAD and IVs removed. Awaiting transport to main lobby for discharge.

## 2017-02-11 NOTE — Discharge Instr - AVS First Page
Physical Therapy Discharge Instructions    Please follow fall prevention strategies at home, use assistive device as recommended by your therapist if one was recommended, clear house of clutter and tripping hazards to prevent falls, follow MD recommendations for when to return to the hospital or follow up appointments.      Your in-hospital Physical Therapist was ***, PT who can be reached at [phone].  If you need to contact outpatient physical therapy services, please contact [phone].

## 2017-02-11 NOTE — Discharge Instr - AVS First Page
Physical Therapy Discharge Instructions    Please walk around several times daily. Please follow fall prevention strategies at home, as recommended by your therapist if one was recommended, clear house of clutter and tripping hazards to prevent falls, follow MD recommendations for when to return to the hospital or follow up appointments.      Your in-hospital Physical Therapist was Feliz Beamravis, PT who can be reached at [phone].  If you need to contact outpatient physical therapy services, please contact [phone].      Occupational Therapy Discharge Instructions    You may need help with activities as needed once you go home. Your Occupational Therapist was Delorise ShinerGrace, OT who can be reached at [phone].      Home Health care has been arranged for your continued recovery. When you get home from the hospital, please contact [facility] at [phone] to arrange the start of your care.

## 2017-02-11 NOTE — Progress Notes
Pt given discharge paperwork and instructions. Pt in NAD and IVs removed. Awaiting transport to main lobby for discharge.

## 2017-02-11 NOTE — Plan of Care
Problem: Outcome Measure Goals  Goal: Increase AMPAC score  Patient will demonstrate improvement in balance and functional mobility as indicated by an increase in AMPAC score to 22/24 in order to decrease risk for falls.   Outcome: Met/Completed Date Met: 02/11/17      Problem: Functional Mobility  Goal: Patient will transition supine to/from sit in bed from left/right  Patient will transition supine to/from sit in bed with Modified Independence (Mod I) and minimal cues in order to prepare for out of bed mobility.   Outcome: Met/Completed Date Met: 02/11/17      Problem: Locomotion  Goal: Patient will ambulate  Patient will ambulate 273f feet with Least restrictive device, Modified Independence (Mod I) and minimal cues in order to prepare for community re-entry.   Outcome: Met/Completed Date Met: 02/11/17

## 2017-02-11 NOTE — Discharge Instr - AVS First Page
Physical Therapy Discharge Instructions    Please walk around several times daily. Please follow fall prevention strategies at home, as recommended by your therapist if one was recommended, clear house of clutter and tripping hazards to prevent falls, follow MD recommendations for when to return to the hospital or follow up appointments.      Your in-hospital Physical Therapist was Feliz Beamravis, PT who can be reached at [phone].  If you need to contact outpatient physical therapy services, please contact [phone].

## 2017-02-11 NOTE — Progress Notes
Report given to Matt H., RN. Transition of care completed.

## 2017-02-11 NOTE — Consults
Consult received. Will submit orders through [facility] [employer] to [facility].     Department of Case Management  Discharge Planning Progress Note       NAME: Riley Evans    MRN: [id number]    AGE: 72 y.o. DOB: 11/01/1945 Date of Admission: 02/07/2017    Payor: Payor: MEDICARE A[employer] / Plan: MEDICARE [employer] HMO / Product Type: HMO/POS /     Advance Directive/Healthcare Decision Maker: Advance Directive/Healthcare Decision Maker:  (refused) (02/07/17 1618)  Legal Documentation: Legal documentation:  (None on file) (02/09/17 1236)    Current Medical Status: stable    The patient is currently: inpatient    Case Manager Assessment/Actions Taken: Rounded with CT Surgery. Patient to discharge today. HHC has been set up through [facility]. Services will start on Sunday. Dr. Pirris to follow for HHC orders since patient does not have a PCP in [place]. PT is recommending a walker. Per patient, he will get a walker on his own if he determines he needs it.    Potential barriers and planned intervention: none    Interdisciplinary updates: see above    Anticipated discharge plan: home with HHC    Expected discharge date: 02/11/2017    Renee E Roemer, RN    02/11/2017 9:40 AM

## 2017-02-11 NOTE — Discharge Instr - AVS First Page
Physical Therapy Discharge Instructions    Please walk around several times daily. Please follow fall prevention strategies at home, as recommended by your therapist if one was recommended, clear house of clutter and tripping hazards to prevent falls, follow MD recommendations for when to return to the hospital or follow up appointments.      Your in-hospital Physical Therapist was Travis, PT who can be reached at [phone].  If you need to contact outpatient physical therapy services, please contact [phone].      Occupational Therapy Discharge Instructions    You may need help with activities as needed once you go home. Your Occupational Therapist was Grace, OT who can be reached at [phone].

## 2017-02-11 NOTE — Progress Notes
Physical Therapy Treatment Note       Start Time (min): 1010  End Time (min): 1019  Total Coded Time (min): 9    Room/Bed: 530/530-01  Admit Date: 02/07/2017  Current Medical Condition: No changes     Past Medical History:   Diagnosis Date   ? Diabetes mellitus (CMS-HCC code)    ? GERD (gastroesophageal reflux disease)    ? Hypertension      Past Surgical History:   Procedure Laterality Date   ? APPENDECTOMY     ? CARDIAC SURGERY         Precautions:  ? FALL    Extremity Precautions:  ? None indicated    Orthotic, Protective, & Supportive Devices:  ? None    Subjective: Pt and RN agreeable to treatment. The pt states "I don't think I need one"  ? Pain Pre-Treatment: Yes, N/A, Location: R groin , Intervention: Nurse notified   ? Pain Post-Treatment: Yes, N/A, Location: R groin , Intervention: Nurse notified and Patient repositioned     **Was an interpreter used: NA    Objective:     Observation: Pt presented and ended sitting in recliner (+) pulse ox, tele, BP cuff    Vitals: Vitals stable    Cognition:   ? Alert and Oriented to Person, Place, Time and Situation  ? Command Following: Follows Multi-step commands    Education: PT areas patient educated on: safe mobility with no AD, fall prevention strategies and transfers    Functional/Therapeutic Activities:  Bed Mobility:  ? Not Tested:    Transfers:  ? Sit to/from Stand: Independent (I) with No assistive device  Locomotion:  ? GAIT: Patient ambulated 200 ft with No assistive device, gait belt, Independent (I). Gait Quality: steady, no LOB    Balance:   ? Sitting: Static  Independent (I), Dynamic  Independent (I)  ? Standing: Static  Independent (I), Dynamic  Independent (I)    Outcome Measures:  NOT TESTED    Assessment: Patient performed all functional mobility without physical assist, no AD needed this session. Recommend home with intermittent assist at D/C.    Other Recommended Services: none    Discharge Recommendations:

## 2017-02-11 NOTE — Progress Notes
Physical Therapy Treatment Note       Start Time (min): 1010  End Time (min): 1019  Total Coded Time (min): 9    Room/Bed: 530/530-01  Admit Date: 02/07/2017  Current Medical Condition: No changes     Past Medical History:   Diagnosis Date   ? Diabetes mellitus (CMS-HCC code)    ? GERD (gastroesophageal reflux disease)    ? Hypertension      Past Surgical History:   Procedure Laterality Date   ? APPENDECTOMY     ? CARDIAC SURGERY         Precautions:  ? FALL    Extremity Precautions:  ? None indicated    Orthotic, Protective, & Supportive Devices:  ? None    Subjective: Pt and RN agreeable to treatment. The pt states "I don't think I need one"  ? Pain Pre-Treatment: Yes, N/A, Location: R groin , Intervention: Nurse notified   ? Pain Post-Treatment: Yes, N/A, Location: R groin , Intervention: Nurse notified and Patient repositioned     **Was an interpreter used: NA    Objective:     Observation: Pt presented and ended sitting in recliner (+) pulse ox, tele, BP cuff    Vitals: Vitals stable    Cognition:   ? Alert and Oriented to Person, Place, Time and Situation  ? Command Following: Follows Multi-step commands    Education: PT areas patient educated on: safe mobility with no AD, fall prevention strategies and transfers    Functional/Therapeutic Activities:  Bed Mobility:  ? Not Tested:    Transfers:  ? Sit to/from Stand: Independent (I) with No assistive device  Locomotion:  ? GAIT: Patient ambulated 200 ft with No assistive device, gait belt, Independent (I). Gait Quality: steady, no LOB    Balance:   ? Sitting: Static  Independent (I), Dynamic  Independent (I)  ? Standing: Static  Independent (I), Dynamic  Independent (I)    Outcome Measures:  NOT TESTED    Assessment: Patient performed all functional mobility without physical assist, no AD needed this session. Recommend home with intermittent assist at D/C.    Other Recommended Services: none    Discharge Recommendations:

## 2017-02-11 NOTE — Plan of Care
Problem: Pain, Acute & Chronic  Goal: Pain is relieved/acceptable level with minimal side effects  Outcome: Ongoing  Frequent monitoring for pain. Pharmacologic and nonpharmacologic measures used to reduce pain. Will establish a pain goal and [facility]. Patient educated on the importance of pain management, patient verbalizes understanding. Patient will notify staff of any discomfort.   Pt denies pain at thsi time    Problem: Risk for Injury R/T Falls  Goal: Prevent falls  Outcome: Ongoing  Pt ambulates steeady on his feet. But explained fall risk precautions.  Impemented fall precaution protocol, bed alarm on and working, pt aware to  Call for any needs or assistance, applied non skin footwear when oob, call beel within reach

## 2017-02-11 NOTE — Plan of Care
Problem: Pain, Acute & Chronic  Goal: Pain is relieved/acceptable level with minimal side effects  Outcome: Ongoing  Frequent monitoring for pain. Pharmacologic and nonpharmacologic measures used to reduce pain. Will establish a pain goal and treat accordingly per MAR and hospital's protocol. Patient educated on the importance of pain management, patient verbalizes understanding. Patient will notify staff of any discomfort.   Pt denies pain at thsi time    Problem: Risk for Injury R/T Falls  Goal: Prevent falls  Outcome: Ongoing  Pt ambulates steeady on his feet. But explained fall risk precautions.  Impemented fall precaution protocol, bed alarm on and working, pt aware to  Call for any needs or assistance, applied non skin footwear when oob, call beel within reach

## 2017-02-11 NOTE — Discharge Instr - AVS First Page
Physical Therapy Discharge Instructions    Please walk around several times daily. Please follow fall prevention strategies at home, as recommended by your therapist if one was recommended, clear house of clutter and tripping hazards to prevent falls, follow MD recommendations for when to return to the hospital or follow up appointments.      Your in-hospital Physical Therapist was Feliz Beamravis, PT who can be reached at [phone].  If you need to contact outpatient physical therapy services, please contact [phone].      Occupational Therapy Discharge Instructions    You may need help with activities as needed once you go home. Your Occupational Therapist was Delorise ShinerGrace, OT who can be reached at [phone].

## 2017-02-11 NOTE — Discharge Instr - AVS First Page
Physical Therapy Discharge Instructions    Please walk around several times daily. Please follow fall prevention strategies at home, as recommended by your therapist if one was recommended, clear house of clutter and tripping hazards to prevent falls, follow MD recommendations for when to return to the hospital or follow up appointments.      Your in-hospital Physical Therapist was Travis, PT who can be reached at [phone].  If you need to contact outpatient physical therapy services, please contact [phone].

## 2017-02-11 NOTE — Discharge Instr - AVS First Page
Physical Therapy Discharge Instructions    Please walk around several times daily. Please follow fall prevention strategies at home, as recommended by your therapist if one was recommended, clear house of clutter and tripping hazards to prevent falls, follow MD recommendations for when to return to the hospital or follow up appointments.      Your in-hospital Physical Therapist was Feliz Beamravis, PT who can be reached at [phone].  If you need to contact outpatient physical therapy services, please contact [phone].      Occupational Therapy Discharge Instructions    You may need help with activities as needed once you go home. Your Occupational Therapist was Delorise ShinerGrace, OT who can be reached at [phone].      Home Health care has been arranged for your continued recovery. When you get home from the hospital, please contact [facility] at [phone] to arrange the start of your care.      The patient/parent/caregiver has received the patient's instructions, belongings, valuables, home medications and prescriptions and has been instructed to bring this form to his/her next doctor visit. The patient was given information about the flu and/or pneumonia vaccines and the opportunity to receive them.     Current telephone number for post discharge phone call: [phone]  Mode of Transportation: Car  Discharged With/To Care of: Spouse    Signed by Kathlene NovemberMatthew W Hutchinson, RN  02/11/2017 11:32 AM

## 2017-02-15 NOTE — Op Note
Preop Diagnosis: Penetrating Ulcer Proximal Descending Thoracic Aorta    Postop Diagnosis: Penetrating Ulcer Proximal Descending Thoracic Aorta    Procedure: 1. Right groin cutdown with exposure of common femoral artery  2. TEVAR    Surgeon: Quincy CarnesBryan Bush, MD  CoSurgeon: Shirlee Moreaniel Siragusa, MD  First Assistant: Algernon HuxleyJohn Pirris, MD  Assistants: Dyke Maesim McConnell, PA-C    Procedure: The patient was taken to the IR suite and placed on the table in supine position.  General anesthesia was induced.  The groins were prepped and draped in sterile fashion.  We performed cut down on the right groin and isolated the common femoral artery at the inguinal ligament.  The vessel was accessed and a wire introduced under flouro.  The patient was heparinized, and the TAG graft sheath advanced under flouroscopy.  Percutaneous access of the left CFA was performed by IR.  We then deployed 2 overlapping 34mm x 10 cm TAG grafts using the "turtle head" technique.  The ulcer was successfully exluded and the left subclavian artery preserved.  There were no endoleaks.  The sheath was then removed and proximal and distal control of the vessel was acheived.  The artery was repaired primarily in 2 layers using 6-0 prolene suture.  The distal pulse was excellent after repair.  The incision was closed in layers.  There were no complications.

## 2017-02-15 NOTE — Op Note
Preop Diagnosis: Penetrating Ulcer Proximal Descending Thoracic Aorta    Postop Diagnosis: Penetrating Ulcer Proximal Descending Thoracic Aorta    Procedure: 1. Right groin cutdown with exposure of common femoral artery  2. TEVAR    Surgeon: Bryan Bush, MD  CoSurgeon: Daniel Siragusa, MD  First Assistant: John Pirris, MD  Assistants: Tim McConnell, PA-C    Procedure: The patient was taken to the IR suite and placed on the table in supine position.  General anesthesia was induced.  The groins were prepped and draped in sterile fashion.  We performed cut down on the right groin and isolated the common femoral artery at the inguinal ligament.  The vessel was accessed and a wire introduced under flouro.  The patient was heparinized, and the TAG graft sheath advanced under flouroscopy.  Percutaneous access of the left CFA was performed by IR.  We then deployed 2 overlapping 34mm x 10 cm TAG grafts using the "turtle head" technique.  The ulcer was successfully exluded and the left subclavian artery preserved.  There were no endoleaks.  The sheath was then removed and proximal and distal control of the vessel was acheived.  The artery was repaired primarily in 2 layers using 6-0 prolene suture.  The distal pulse was excellent after repair.  The incision was closed in layers.  There were no complications.

## 2017-02-17 NOTE — Discharge Summary
Follow Up Appointments: Future Appointments  Date Time Provider Department Center   02/22/2017 10:00 AM JX CARD CTS ACC [facility] CRD [facility]        Please call our office at [phone] if you have questions or concerns.     Noel Masters Perry  02/17/2017 2:37 PM

## 2017-02-17 NOTE — Discharge Summary
Released Date Time - 02/08/2017 4:08 PM    Resident - Chase Sofiak         Disposition: home    Length of Stay: 4       Discharge Medications      Active Meds      Sig   cyclobenzaprine 5 MG Tabs  Commonly known as:  FLEXERIL   5 mg, Oral, 3 TIMES DAILY  Quantity:  90 tablet        Continued Prior to Admission Meds      Sig   allopurinol 100 MG Tabs  Commonly known as:  ZYLOPRIM   Oral     aspirin 81 MG Tabs   Oral, DAILY     atorvastatin 40 MG Tabs  Commonly known as:  LIPITOR   Oral     GLUCOSAMINE CHOND COMPLEX/MSM PO   Oral     glyBURIDE micronized 3 MG Tabs  Commonly known as:  GLYNASE   1.5 mg, Oral, 2 TIMES DAILY BEFORE MEALS     losartan 25 MG Tabs  Commonly known as:  COZAAR   Oral, DAILY     metoprolol tartrate 25 MG Tabs  Commonly known as:  LOPRESSOR   Oral, 2 TIMES DAILY     niacin 500 MG Tabs   Oral, DAILY WITH BREAKFAST     omeprazole 40 MG Cpdr  Commonly known as:  PriLOSEC   Oral, DAILY        You might also be taking other medications not listed above. If you have questions about any of your other medications, talk to the person who prescribed them or your Primary Care Provider.                Discharge Procedures:   Discharge Orders  Refer to Cardiac Rehab   Standing Status: Future  Standing Exp. Date: 02/11/18   Referral Type: Evaluate and Treat   Number of Visits Requested: 1     Diet cardiac/carb consistent     Symptoms to monitor after discharge   For any questions or concerns and to report any of the following symptoms/problems: Worsening of current symptoms, Chest pain, Shortness of breath, Dizziness, Swelling in ankles or legs, Pain uncontrolled by medication, Drainage or foul odor from incision, Extensive bruising or discoloration, Nausea/vomiting, Elevated temperature. Notify your physician.     WARFARIN (Coumadin) Education is NOT Required     The orders/medications listed here were reviewed, ordered and electronically signed by:   Noel Masters Perry, PA-C

## 2017-02-17 NOTE — Discharge Summary
Department of Cardiothoracic Surgery  Discharge Summary      Riley Evans is a 72 y.o. male patient who is being discharged.    Admit date: 02/07/2017    Discharge date and time: 02/11/2017 12:57 PM     Admitting Physician: Debara PickettVijayarama Vegesna, MD     Discharge Physician: Algernon HuxleyJohn Pirris, MD    Admission Diagnoses: Penetrating atherosclerotic ulcer of aorta (CMS-HCC code)    Secondary Diagnoses: There are no active problems to display for this patient.       Complications: None    Discharge Diagnoses: Penetrating atherosclerotic ulcer of aorta (CMS-HCC code)    Operations and Major Procedures:     Procedure(s):  THORACIC ENDOVASCULAR ANEURYSM REPAIR (TEVAR) (N/A)  -------------------    Significant Findings: Ulcerated plaque of the descending aorta    Hospital Course: Patient tolerated the TEVAR well and had no major sequelae.    Discharged Condition: good    Consults:   Consults   Procedures   ? Hospitalist Consult for ED (auto page)   ? Inpatient consult to Cardiothoracic Surgery   ? Inpatient Consult to Case Management   ? Inpatient consult to Case Management       Significant Diagnostic Studies:   CTA dissection protocol.  ?  Technique: Axial images of the chest, abdomen, and pelvis were obtained from the level of the   thoracic inlet to the pubic symphysis before and following administration of intravenous   contrast per dissection protocol.  3-D MIP images were made from the axial postcontrast data   set and reviewed.    ?  History: 72 years Male Atheromatous plaque  ?  Comparison: CTA February 07, 2017  ?  Findings:  ?  Aorta and Great Vessels: Focal Inferolateral outpouching redemonstrated at the proximal   descending aorta, once again compatible with penetrating ulcer. No evidence of significant of   progression or dissection.  Pulmonary Arteries: There are no filling defects within the pulmonary arterial system to   suggest pulmonary embolus.  ?

## 2017-02-17 NOTE — Discharge Summary
Follow Up Appointments: Future Appointments  Date Time Provider Department Center   02/22/2017 10:00 AM JX CARD CTS ACC [facility] CRD [facility]        Please call our office at [phone] if you have questions or concerns.     Hillary Bowoel Masters Perry  02/17/2017 2:37 PM

## 2017-02-17 NOTE — Discharge Summary
Abdominal/Pelvic Vasculature: Mild ectasia of the infrarenal abdominal aorta without focal   aneurysm or dissection. Nonflow limiting calcified and soft plaque visualized throughout the   thoracic and abdominal aorta. There is ectasia of the right common iliac artery with focal   aneurysmal dilatation of the distal common iliac artery measuring up to 1.8 cm.  Thyroid: No significant abnormalities.  Mediastinum/hilum:  No mediastinal masses or adenopathy is seen.    The visualized esophagus is   normal.  Heart: Prior median sternotomy. Coronary vascular calcification is seen.  Lungs: The lung fields are clear bilaterally without infiltrates, volume loss, or mass lesions.  ?  Liver and Biliary Tree: Views of the liver demonstrate it to be normal in appearance without   mass lesions or other abnormalities. Small radiopaque calculi were noted on prior study. There   is vicarious excretion of intravenous contrast.  Spleen: The spleen is normal in appearance.  Pancreas: The pancreas is normal in appearance without masses, cystic lesions, or ductal   dilatation.  Kidneys: Left fluid attenuating renal cyst.  Adrenal Glands:  The adrenal glands are normal in appearance.    Bowel: The regional bowel is normal in appearance.  No evidence of bowel obstruction is seen.  ?  Osseous structures: Multilevel degenerative changes are noted in the thoracolumbar spine. There   is no acute osseous abnormality.  ?  IMPRESSION:  Impression:  ?  Redemonstration of focal outpouching at the proximal inferolateral arch/descending aorta likely   representing a penetrating ulcer.  ?  No significant stenosis or focal occlusions noted throughout the aortoiliac or proximal femoral   vasculature. 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  ?  I personally reviewed the images and the residents findings and agree with the above.  ?  Read By - Saeed Bashir    Electronically Verified By - Saeed Bashir

## 2017-02-17 NOTE — Discharge Summary
Abdominal/Pelvic Vasculature: Mild ectasia of the infrarenal abdominal aorta without focal   aneurysm or dissection. Nonflow limiting calcified and soft plaque visualized throughout the   thoracic and abdominal aorta. There is ectasia of the right common iliac artery with focal   aneurysmal dilatation of the distal common iliac artery measuring up to 1.8 cm.  Thyroid: No significant abnormalities.  Mediastinum/hilum:  No mediastinal masses or adenopathy is seen.    The visualized esophagus is   normal.  Heart: Prior median sternotomy. Coronary vascular calcification is seen.  Lungs: The lung fields are clear bilaterally without infiltrates, volume loss, or mass lesions.  ?  Liver and Biliary Tree: Views of the liver demonstrate it to be normal in appearance without   mass lesions or other abnormalities. Small radiopaque calculi were noted on prior study. There   is vicarious excretion of intravenous contrast.  Spleen: The spleen is normal in appearance.  Pancreas: The pancreas is normal in appearance without masses, cystic lesions, or ductal   dilatation.  Kidneys: Left fluid attenuating renal cyst.  Adrenal Glands:  The adrenal glands are normal in appearance.    Bowel: The regional bowel is normal in appearance.  No evidence of bowel obstruction is seen.  ?  Osseous structures: Multilevel degenerative changes are noted in the thoracolumbar spine. There   is no acute osseous abnormality.  ?  IMPRESSION:  Impression:  ?  Redemonstration of focal outpouching at the proximal inferolateral arch/descending aorta likely   representing a penetrating ulcer.  ?  No significant stenosis or focal occlusions noted throughout the aortoiliac or proximal femoral   vasculature. 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  ?  I personally reviewed the images and the residents findings and agree with the above.  ?  Read By Dianne Dun- Saeed Bashir    Electronically Verified By - Dianne DunSaeed Bashir

## 2017-02-17 NOTE — Discharge Summary
Department of Cardiothoracic Surgery  Discharge Summary      Riley Evans is a 72 y.o. male patient who is being discharged.    Admit date: 02/07/2017    Discharge date and time: 02/11/2017 12:57 PM     Admitting Physician: Vijayarama Vegesna, MD     Discharge Physician: John Pirris, MD    Admission Diagnoses: Penetrating atherosclerotic ulcer of aorta (CMS-HCC code)    Secondary Diagnoses: There are no active problems to display for this patient.       Complications: None    Discharge Diagnoses: Penetrating atherosclerotic ulcer of aorta (CMS-HCC code)    Operations and Major Procedures:     Procedure(s):  THORACIC ENDOVASCULAR ANEURYSM REPAIR (TEVAR) (N/A)  -------------------    Significant Findings: Ulcerated plaque of the descending aorta    Hospital Course: Patient tolerated the TEVAR well and had no major sequelae.    Discharged Condition: good    Consults:   Consults   Procedures   ? Hospitalist Consult for ED (auto page)   ? Inpatient consult to Cardiothoracic Surgery   ? Inpatient Consult to Case Management   ? Inpatient consult to Case Management       Significant Diagnostic Studies:   CTA dissection protocol.  ?  Technique: Axial images of the chest, abdomen, and pelvis were obtained from the level of the   thoracic inlet to the pubic symphysis before and following administration of intravenous   contrast per dissection protocol.  3-D MIP images were made from the axial postcontrast data   set and reviewed.    ?  History: 72 years Male Atheromatous plaque  ?  Comparison: CTA February 07, 2017  ?  Findings:  ?  Aorta and Great Vessels: Focal Inferolateral outpouching redemonstrated at the proximal   descending aorta, once again compatible with penetrating ulcer. No evidence of significant of   progression or dissection.  Pulmonary Arteries: There are no filling defects within the pulmonary arterial system to   suggest pulmonary embolus.  ?

## 2017-02-17 NOTE — Discharge Summary
Released Date Time - 02/08/2017 4:08 PM    Resident - Claudette Headhase Sofiak         Disposition: home    Length of Stay: 4       Discharge Medications      Active Meds      Sig   cyclobenzaprine 5 MG Tabs  Commonly known as:  FLEXERIL   5 mg, Oral, 3 TIMES DAILY  Quantity:  90 tablet        Continued Prior to Admission Meds      Sig   allopurinol 100 MG Tabs  Commonly known as:  ZYLOPRIM   Oral     aspirin 81 MG Tabs   Oral, DAILY     atorvastatin 40 MG Tabs  Commonly known as:  LIPITOR   Oral     GLUCOSAMINE CHOND COMPLEX/MSM PO   Oral     glyBURIDE micronized 3 MG Tabs  Commonly known as:  GLYNASE   1.5 mg, Oral, 2 TIMES DAILY BEFORE MEALS     losartan 25 MG Tabs  Commonly known as:  COZAAR   Oral, DAILY     metoprolol tartrate 25 MG Tabs  Commonly known as:  LOPRESSOR   Oral, 2 TIMES DAILY     niacin 500 MG Tabs   Oral, DAILY WITH BREAKFAST     omeprazole 40 MG Cpdr  Commonly known as:  PriLOSEC   Oral, DAILY        You might also be taking other medications not listed above. If you have questions about any of your other medications, talk to the person who prescribed them or your Primary Care Provider.                Discharge Procedures:   Discharge Orders  Refer to Cardiac Rehab   Standing Status: Future  Standing Exp. Date: 02/11/18   Referral Type: Evaluate and Treat   Number of Visits Requested: 1     Diet cardiac/carb consistent     Symptoms to monitor after discharge   For any questions or concerns and to report any of the following symptoms/problems: Worsening of current symptoms, Chest pain, Shortness of breath, Dizziness, Swelling in ankles or legs, Pain uncontrolled by medication, Drainage or foul odor from incision, Extensive bruising or discoloration, Nausea/vomiting, Elevated temperature. Notify your physician.     WARFARIN (Coumadin) Education is NOT Required     The orders/medications listed here were reviewed, ordered and electronically signed by:   Hillary BowNoel Masters Perry, PA-C

## 2017-02-22 ENCOUNTER — Ambulatory Visit: Admit: 2017-02-22 | Discharge: 2017-02-22 | Attending: Surgical

## 2017-02-22 DIAGNOSIS — I719 Aortic aneurysm of unspecified site, without rupture: Secondary | ICD-10-CM | POA: Insufficient documentation

## 2017-02-22 DIAGNOSIS — E119 Type 2 diabetes mellitus without complications: Secondary | ICD-10-CM

## 2017-02-22 DIAGNOSIS — K219 Gastro-esophageal reflux disease without esophagitis: Principal | ICD-10-CM

## 2017-02-22 DIAGNOSIS — Z7982 Long term (current) use of aspirin: Secondary | ICD-10-CM

## 2017-02-22 DIAGNOSIS — Z79899 Other long term (current) drug therapy: Secondary | ICD-10-CM

## 2017-02-22 DIAGNOSIS — Z87891 Personal history of nicotine dependence: Secondary | ICD-10-CM

## 2017-02-22 DIAGNOSIS — I10 Essential (primary) hypertension: Secondary | ICD-10-CM

## 2017-02-22 NOTE — Progress Notes
Patient and his wife are well informed about his problem and the follow up studies.

## 2017-02-22 NOTE — Progress Notes
No family history of sudden cardiac death.     ROS:    Review of Systems   Constitutional: Negative for fever, chills and diaphoresis.   HENT: Negative for hearing loss, tinnitus and ear discharge.   Eyes: Negative for blurred vision and double vision.   Respiratory: Negative for hemoptysis, wheezing and stridor.   Cardiovascular: Negative for orthopnea and PND.   Gastrointestinal: Negative for abdominal pain, blood in stool and melena.   Genitourinary: Negative for hematuria and flank pain.   Musculoskeletal: Negative for falls.   Skin: Negative for itching and rash.   Neurological: Negative for tingling and tremors.   Endo/Heme/Allergies: Negative for environmental allergies and polydipsia.   Psychiatric/Behavioral: Negative for suicidal ideas and hallucinations.   All others reviewed and negative.     VITALS:    BP 120/58  - Pulse 68  - Resp 16  - Ht 1.829 m (6')  - Wt 95.7 kg (211 lb)  - BMI 28.62 kg/m?      PHYSICAL EXAM:    Constitution: Appears well   HEENT: NCAT and hearing   Neck: Mass: none   Chest: no deformity   Respiratory: normal effort   Cardiac: Auscultation: crisp heart sounds   Abdom: Soft, non-tender, bowel sounds normal, no masses, no organomegaly   Extremities: Pulses: 2+ radial pulses and ninimal edema, incision in the right groin is healing well.  Neuro: grossly non-focal   Lymph: no cervical, inguinal, axillary adenopathy   Psych: normal mood, affect, insight, judgement    TEST CONCLUSIONS:    Labs:   Lab Results   Component Value Date    CREATININE 1.11 02/11/2017    No results found for: ALBUMIN No components found for: PLATELETS   ASSESSMENT AND PLAN:    Riley Evans has done well post op and is ambulating without dfifficulty. I have recommended he walk daily. No orders of the defined types were placed in this encounter.    We will repeat the CTA with dissection protocol in 3 months as routine follow up of the graft.

## 2017-02-22 NOTE — Progress Notes
02/22/2017     Mable Parisustaquio Graybill   [id number]   September 08, 1945     Referring Physician:Noel Masters Marina GoodellPerry, PA-C  [address]  [place], [place] [id number]     Reason for visit: POSTOPERATIVE EVALUATION following TEVAR forlpenetrating ulcer in the descending aorta.    Pre-Op Diagnosis Codes: Penetrating atherosclerotic ulcer of aorta (CMS-HCC code) [I70.0]  Post-Op Diagnosis Codes:     * Penetrating atherosclerotic ulcer of aorta (CMS-HCC code) [I70.0]  ?  Operations and Major Procedures:   ?  Procedure(s):  THORACIC ENDOVASCULAR ANEURYSM REPAIR (TEVAR) (N/A)  -------------------      Past Medical History:   Diagnosis Date   ? Diabetes mellitus (CMS-HCC code)    ? GERD (gastroesophageal reflux disease)    ? Hypertension       Past Surgical History:   Procedure Laterality Date   ? APPENDECTOMY     ? CARDIAC SURGERY        Outpatient Prescriptions Marked as Taking for the 02/22/17 encounter (Office Visit) with Randie HeinzPerry, Noel Masters, PA-C   Medication Sig Dispense Refill   ? allopurinol (ZYLOPRIM) 100 MG PO Tablet by mouth.     ? aspirin 81 MG PO Tablet Take by mouth daily.     ? atorvastatin (LIPITOR) 40 MG PO Tablet by mouth.     ? cyclobenzaprine (FLEXERIL) 5 MG PO Tablet Take 1 tablet by mouth 3 times daily. 90 tablet 0   ? glyBURIDE micronized (GLYNASE) 3 MG PO Tablet Take 1.5 mg by mouth 2 times daily (before breakfast and dinner).     ? losartan (COZAAR) 25 MG PO Tablet Take by mouth daily.     ? metoprolol tartrate (LOPRESSOR) 25 MG PO Tablet Take by mouth 2 times daily.     ? Misc Natural Products (GLUCOSAMINE CHOND COMPLEX/MSM PO) by mouth.     ? niacin 500 MG PO Tablet Take by mouth daily (with breakfast).     ? omeprazole (PriLOSEC) 40 MG PO Capsule Delayed Release Take by mouth daily.        Social History   Substance Use Topics   ? Smoking status: Former Smoker     Types: Cigarettes     Quit date: 2003   ? Smokeless tobacco: Never Used   ? Alcohol use No      History reviewed. No pertinent family history.

## 2017-02-22 NOTE — Progress Notes
No teaching statement needed

## 2017-04-28 ENCOUNTER — Ambulatory Visit: Attending: Internal Medicine

## 2017-04-28 DIAGNOSIS — Z1211 Encounter for screening for malignant neoplasm of colon: Secondary | ICD-10-CM | POA: Insufficient documentation

## 2017-04-28 DIAGNOSIS — R1310 Dysphagia, unspecified: Principal | ICD-10-CM

## 2017-04-28 DIAGNOSIS — K219 Gastro-esophageal reflux disease without esophagitis: Principal | ICD-10-CM

## 2017-04-28 DIAGNOSIS — E119 Type 2 diabetes mellitus without complications: Secondary | ICD-10-CM

## 2017-04-28 DIAGNOSIS — I10 Essential (primary) hypertension: Secondary | ICD-10-CM

## 2017-04-28 DIAGNOSIS — E139 Other specified diabetes mellitus without complications: Secondary | ICD-10-CM

## 2017-04-28 MED ORDER — ATORVASTATIN CALCIUM 20 MG PO TABS
20 mg | ORAL
Start: 2017-04-28 — End: 2017-10-11

## 2017-04-28 MED ORDER — LOSARTAN POTASSIUM 25 MG PO TABS
25 mg | ORAL
Start: 2017-04-28 — End: ?

## 2017-04-28 MED ORDER — OMEPRAZOLE 20 MG PO CPDR
20 mg | ORAL
Start: 2017-04-28 — End: 2017-10-11

## 2017-04-28 MED ORDER — METOPROLOL TARTRATE 25 MG PO TABS
25 mg | ORAL
Start: 2017-04-28 — End: ?

## 2017-04-28 MED ORDER — NIACIN ER 500 MG PO CPCR
500 mg | ORAL
Start: 2017-04-28 — End: 2017-04-28

## 2017-04-28 MED ORDER — ASPIRIN EC 81 MG PO TBEC
81 mg | ORAL
Start: 2017-04-28 — End: ?

## 2017-04-28 MED ORDER — ALLOPURINOL 100 MG PO TABS
100 mg | ORAL
Start: 2017-04-28 — End: ?

## 2017-05-02 DIAGNOSIS — E139 Other specified diabetes mellitus without complications: Secondary | ICD-10-CM

## 2017-05-02 DIAGNOSIS — K219 Gastro-esophageal reflux disease without esophagitis: Principal | ICD-10-CM

## 2017-05-02 DIAGNOSIS — E119 Type 2 diabetes mellitus without complications: Secondary | ICD-10-CM

## 2017-05-02 DIAGNOSIS — I10 Essential (primary) hypertension: Secondary | ICD-10-CM

## 2017-05-09 ENCOUNTER — Encounter: Attending: Internal Medicine

## 2017-05-11 DIAGNOSIS — I719 Aortic aneurysm of unspecified site, without rupture: Principal | ICD-10-CM

## 2017-05-12 ENCOUNTER — Inpatient Hospital Stay: Admit: 2017-05-12 | Discharge: 2017-05-13

## 2017-05-12 DIAGNOSIS — I719 Aortic aneurysm of unspecified site, without rupture: Principal | ICD-10-CM

## 2017-05-18 ENCOUNTER — Inpatient Hospital Stay

## 2017-05-18 ENCOUNTER — Inpatient Hospital Stay: Admit: 2017-05-18 | Discharge: 2017-05-19

## 2017-05-18 DIAGNOSIS — N281 Cyst of kidney, acquired: Secondary | ICD-10-CM

## 2017-05-18 DIAGNOSIS — Z95828 Presence of other vascular implants and grafts: Secondary | ICD-10-CM

## 2017-05-18 DIAGNOSIS — I723 Aneurysm of iliac artery: Secondary | ICD-10-CM

## 2017-05-18 DIAGNOSIS — I719 Aortic aneurysm of unspecified site, without rupture: Principal | ICD-10-CM

## 2017-05-18 MED ORDER — IOHEXOL 350 MG/ML IV SOLN SH
130 mL | Freq: Once | INTRAVENOUS | Status: CP
Start: 2017-05-18 — End: ?

## 2017-05-18 MED ORDER — SODIUM CHLORIDE 0.9% FOR FLUSHES
20-180 mL | INTRAVENOUS | Status: CP | PRN
Start: 2017-05-18 — End: ?

## 2017-05-20 DIAGNOSIS — I719 Aortic aneurysm of unspecified site, without rupture: Principal | ICD-10-CM

## 2017-05-24 ENCOUNTER — Inpatient Hospital Stay

## 2017-10-11 ENCOUNTER — Ambulatory Visit: Admit: 2017-10-11 | Discharge: 2017-10-11 | Attending: Surgical

## 2017-10-11 DIAGNOSIS — I714 Abdominal aortic aneurysm, without rupture: Secondary | ICD-10-CM

## 2017-10-11 DIAGNOSIS — I10 Essential (primary) hypertension: Secondary | ICD-10-CM

## 2017-10-11 DIAGNOSIS — Z951 Presence of aortocoronary bypass graft: Secondary | ICD-10-CM

## 2017-10-11 DIAGNOSIS — K219 Gastro-esophageal reflux disease without esophagitis: Secondary | ICD-10-CM

## 2017-10-11 DIAGNOSIS — Z91040 Latex allergy status: Secondary | ICD-10-CM

## 2017-10-11 DIAGNOSIS — E139 Other specified diabetes mellitus without complications: Secondary | ICD-10-CM

## 2017-10-11 DIAGNOSIS — R1012 Left upper quadrant pain: Secondary | ICD-10-CM

## 2017-10-11 DIAGNOSIS — E119 Type 2 diabetes mellitus without complications: Secondary | ICD-10-CM

## 2017-10-11 DIAGNOSIS — I719 Aortic aneurysm of unspecified site, without rupture: Principal | ICD-10-CM

## 2017-10-11 DIAGNOSIS — Z7982 Long term (current) use of aspirin: Secondary | ICD-10-CM

## 2017-10-11 DIAGNOSIS — I2510 Atherosclerotic heart disease of native coronary artery without angina pectoris: Secondary | ICD-10-CM

## 2017-11-04 DIAGNOSIS — I719 Aortic aneurysm of unspecified site, without rupture: Principal | ICD-10-CM

## 2017-12-08 ENCOUNTER — Inpatient Hospital Stay

## 2017-12-09 ENCOUNTER — Inpatient Hospital Stay: Admit: 2017-12-09 | Discharge: 2017-12-10

## 2017-12-09 DIAGNOSIS — I723 Aneurysm of iliac artery: Secondary | ICD-10-CM

## 2017-12-09 DIAGNOSIS — I77811 Abdominal aortic ectasia: Secondary | ICD-10-CM

## 2017-12-09 DIAGNOSIS — I700 Atherosclerosis of aorta: Secondary | ICD-10-CM

## 2017-12-09 DIAGNOSIS — Z951 Presence of aortocoronary bypass graft: Secondary | ICD-10-CM

## 2017-12-09 DIAGNOSIS — N281 Cyst of kidney, acquired: Secondary | ICD-10-CM

## 2017-12-09 DIAGNOSIS — I719 Aortic aneurysm of unspecified site, without rupture: Principal | ICD-10-CM

## 2017-12-09 MED ORDER — IOHEXOL 350 MG/ML IV SOLN SH
150 mL | Freq: Once | INTRAVENOUS | Status: CP
Start: 2017-12-09 — End: ?

## 2017-12-09 MED ORDER — SODIUM CHLORIDE 0.9% FOR FLUSHES
20-180 mL | INTRAVENOUS | Status: CP | PRN
Start: 2017-12-09 — End: ?

## 2017-12-20 ENCOUNTER — Ambulatory Visit: Admit: 2017-12-20 | Discharge: 2017-12-20 | Attending: Surgical

## 2017-12-20 DIAGNOSIS — I714 Abdominal aortic aneurysm, without rupture: Principal | ICD-10-CM

## 2017-12-20 DIAGNOSIS — I10 Essential (primary) hypertension: Secondary | ICD-10-CM

## 2017-12-20 DIAGNOSIS — Z79899 Other long term (current) drug therapy: Secondary | ICD-10-CM

## 2017-12-20 DIAGNOSIS — Z7982 Long term (current) use of aspirin: Secondary | ICD-10-CM

## 2017-12-20 DIAGNOSIS — E119 Type 2 diabetes mellitus without complications: Secondary | ICD-10-CM

## 2017-12-20 DIAGNOSIS — Z7984 Long term (current) use of oral hypoglycemic drugs: Secondary | ICD-10-CM

## 2017-12-20 DIAGNOSIS — I716 Thoracoabdominal aortic aneurysm, without rupture: Secondary | ICD-10-CM

## 2017-12-20 DIAGNOSIS — K219 Gastro-esophageal reflux disease without esophagitis: Secondary | ICD-10-CM

## 2017-12-20 DIAGNOSIS — I723 Aneurysm of iliac artery: Secondary | ICD-10-CM

## 2017-12-20 DIAGNOSIS — Z87891 Personal history of nicotine dependence: Secondary | ICD-10-CM

## 2017-12-20 DIAGNOSIS — E139 Other specified diabetes mellitus without complications: Secondary | ICD-10-CM

## 2018-12-19 ENCOUNTER — Inpatient Hospital Stay: Payer: Medicare Other

## 2019-05-03 ENCOUNTER — Ambulatory Visit: Admit: 2019-05-03 | Discharge: 2019-05-03 | Attending: Internal Medicine

## 2019-12-08 NOTE — Progress Notes (Signed)
[facility]  [address] [place], [place] [address] Phone: [phone]  Fax: [phone]   Clinic Day:  12/10/2019  Referring physician: Danelle Berry, NP  Chief Complaint: Timothy Murray is a 74 y.o. male with thrombocytopenia and clinical stage T2N0 hepatocellular carcinoma s/p proton therapy who is referred in consultation by Evern Bio, NP for reassessment.  HPI: The patient was last seen in the hematology clinic on 12/16/2016. A t that time, he felt well.  He denied any B symptoms.  Exam revealed no adenopathy or hepatosplenomegaly. Thrombocytopenia work-up was reviewed.  Work-up on 12/07/2016 revealed a hematocrit of 45.1, hemoglobin 15.7, MCV 96.6, platelets 114,000, WBC 6200 with an ANC of 3500.  Differential was unremarkable.  Peripheral smear revealed platelets of various sizes, some large body platelets.  Normal studies included: PT, PTT, SPEP, free light chains, H pylori antibody, hepatitis B core antibody, hepatitis C antibody, HIV testing, ANA, B12, and folate.  We discussed suspected chronic immune mediated thrombocytic purpura (ITP). There were no indications for treatment. The patient was to follow up prn.  The patient saw Evern Bio, NP on 11/27/2019 for abdominal pain. Hematocrit was 38.1, hemoglobin 13.2, platelets 71,000, WBC 3,800 (ANC 2400). Platelet aggregation was noted in the sample. CMP was normal with a creatinine 0.99, albumen 4.4, and normal LFTs.  He was noted that while living in [place], he was diagnosed with hepatitis B.  He was taking omeprazole.  Abdomen and pelvis CT scan were ordered.  He was referred back to hematology.  CBC has been followed: 07/01/2011: Hematocrit 47.5, hemoglobin 16.5, MCV 94.0, platelets 111,000, WBC 10,300. 10/24/2012: Hematocrit 43.8, hemoglobin 15.6, MCV 94.0, platelets 165,000, WBC   7,200. 10/29/2012: Hematocrit 29.0, hemoglobin 10.4, MCV 92.4, platelets 126,000, WBC  15,000. 01/07/2016: Hematocrit 48.3, hemoglobin 16.7, MCV 94.0, platelets 110,000, WBC   5,700. 05/28/2016: Hematocrit 45.7, hemoglobin 15.3, MCV 98.0, platelets 100,000, WBC   6,200. 09/30/2016: Hematocrit 44.3, hemoglobin 15.6, MCV 96.0, platelets   94,000, WBC   5,900. 11/23/2016: Hematocrit 45.0, hemoglobin 15.5, MCV 97.0, platelets 111,000, WBC   6,100. 12/07/2016: Hematocrit 45.1, hemoglobin 15.7, MCV 96.6, platelets 114,000, WBC   6,200. 02/11/2017: Hematocrit 32.6, hemoglobin 11.4, MCV 94.5, platelets   92,000, WBC   8,880. 11/27/2019: Hematocrit 38.1, hemoglobin 13.2, MCV 97.0, platelets   71,000, WBC   3,800.  He had triple bypass surgery in 2014.  The patient has an aortic stent placed in 2019; he undergoes annual CT angiograms to assess the condition of the stent. CT scan on 03/09/2019 revealed a 5.3 cm enhancing mass in the central right lobe of the liver, concerning for malignancy.  CT angiogram on 03/14/2019 confirmed the presence of the lesion.  MRI on 03/26/2019 revealed a 4.8 x 4.5 x 4.6 cm in size, involving hepatic segments VIII and V.  Liver lesion FNA on 04/20/2019 revealed moderately differentiated (grade 2) hepatocellular carcinoma.  He was felt to have aT2N0 lesion involving both hepatic segments VIII and V.  PET scan on 04/30/2019 revealed a 4.5 cm subtle lesion in the right lobe of the liver without increased FDG uptake. There was no focal uptake elsewhere to suggest metastatic disease or other malignancy. Head MRI was unremarkable.  Abdominal CT (liver) with and without contrast on 05/03/2019 revealed hepatic cirrhosis with sequelae of portal hypertension. There was segment V 5.1 x 5.6 cm hepatocellular carcinoma with fiducial marker along its posterior inferior aspect. This mass abutted but did not definitively invade the adjacent gallbladder.  He was offered surgical excision, but decided on radiation because he was concerned about COVID-19.  Abdominal CT without  contrast on 06/04/2019 revealed a cirrhotic liver with 5.6 cm hypoattenuating mass in segment VIII with fiducial marker present. There was stigmata of portal hypertension. There was cholelithiasis and atherosclerosis.   The patient saw Dr. Rennie Natter on 06/25/2019. He had completed a course of proton beam radiation that day. He received 70 CGE in 25 fractions.  Radiation completed on 06/25/2019.  Abdominal MRI with and without contrast on 07/26/2019 revealed stable hepatocellular carcinoma centered in hepatic segment VIII and V measuring 5.9 x 5.4 x 4.6 cm. There was cirrhosis with similar sequela of portal hypertension including small perigastric/perisplenic varices and mild splenomegaly (13.7 cm).  The patient saw Dr. Westley Chandler. Dia Sitter on 07/31/2019. He was doing well with no abdominal pain. Follow up was planned for 3 months with repeat abdominal MRI.  Abdominal MRI with and without contrast on 10/26/2019 at [facility] in [place] revealed decreased size of the segment VIII mass c/w hepatocellular carcinoma which demonstrated viable tumor with thick rim of enhancement (previously 6 x 5.4 x 4.8 cm, now 5 x 4.3 x 4.4 cm). There were no new suspicious hepatic lesions. There was cirrhosis with portal hypertension including splenomegaly (13.5 cm) and small upper abdominal collaterals.  Labs on 10/26/2019 revealed a hematocrit of 44.5, hemoglobin 15.0, platelets 80,000, WBC 4,600 with an [place] of 2866. Total bilirubin was 1.4, AST 44, ALT 49. AFP was 5.6. CEA was 1.2. PSA was 1.56.  Symptomatically, he has been "ok". He has reflux, runny nose in the mornings, nocturia, right shoulder pain, and on and off epigastric pain. He lost weight when he was being treated for cancer but has been gaining it back. He was previously 216 lbs and his weight went down to 208 lbs. He does not know if he has had hepatitis.  The patient denies fevers, sweats, headaches, changes in vision, sore throat,  cough, shortness of breath, chest pain, palpitations, early satiety, nausea, vomiting, diarrhea, skin changes, numbness, weakness, balance or coordination problems, and bleeding of any kind.   Past Medical History:  Diagnosis Date  . Arthritis    "lower back" (10/24/2012)  . Coronary artery disease   . GERD (gastroesophageal reflux disease)   . High cholesterol   . Hypertension   . Type II diabetes mellitus ([place])     Past Surgical History:  Procedure Laterality Date  . APPENDECTOMY  2002  . CARDIAC CATHETERIZATION  10/24/2012  . CORONARY ARTERY BYPASS GRAFT N/A 10/26/2012   Procedure: CORONARY ARTERY BYPASS GRAFTING (CABG);  Surgeon: Melrose Nakayama, MD;  Location: [facility];  Service: Open Heart Surgery;  Laterality: N/A;  Times 3 using left internal mammary artery and endoscopically harvested right saphenous vein    Family History  Problem Relation Age of Onset  . Cancer Sister     Social History:  reports that he quit smoking about 18 years ago. His smoking use included cigarettes. He has a 15.00 pack-year smoking history. He has never used smokeless tobacco. He reports that he does not drink alcohol and does not use drugs. He stopped smoking and drinking in alcohol in 2003. Before that, he drank a couple of times per week. The patient is accompanied by his wife, [name], today.  Allergies:  Allergies  Allergen Reactions  . Other Nausea And Vomiting  . Soap & Cleansers     "liquid soap" antibacterial -rash  .  Latex Rash and Itching    Current Medications: Current Outpatient Medications  Medication Sig Dispense Refill  . atorvastatin (LIPITOR) 20 MG tablet Take 1 tablet (20 mg total) by mouth daily at 6 PM. 30 tablet 1  . fluocinonide cream (LIDEX) 0.05 %     . glimepiride (AMARYL) 1 MG tablet Take 1 mg by mouth daily.    Marland Kitchen glucosamine-chondroitin 500-400 MG tablet Take 1 tablet by mouth daily.    Marland Kitchen losartan (COZAAR) 25 MG tablet Take 25 mg by mouth daily.    .  metoprolol tartrate (LOPRESSOR) 25 MG tablet Take 25 mg by mouth daily.    Marland Kitchen omeprazole (PRILOSEC) 20 MG capsule Take 20 mg by mouth every morning.    . potassium chloride SA (K-DUR,KLOR-CON) 20 MEQ tablet Take 20 mEq by mouth daily.    Marland Kitchen allopurinol (ZYLOPRIM) 100 MG tablet Take 100 mg by mouth daily. (Patient not taking: Reported on 12/10/2019)  3  . aspirin EC 81 MG tablet Take 81 mg by mouth daily. (Patient not taking: Reported on 12/10/2019)    . furosemide (LASIX) 40 MG tablet Take 40 mg by mouth daily. (Patient not taking: Reported on 12/10/2019)    . ibuprofen (ADVIL,MOTRIN) 600 MG tablet Take 1 tablet (600 mg total) by mouth every 6 (six) hours as needed. (Patient not taking: Reported on 12/07/2016) 30 tablet 0  . Multiple Vitamins-Minerals (MEGA MULTIVITAMIN FOR MEN) TABS Take 1 tablet by mouth daily. (Patient not taking: Reported on 12/10/2019)    . niacin 500 MG CR capsule Take 1 capsule (500 mg total) by mouth at bedtime. (Patient not taking: Reported on 12/10/2019) 30 capsule 1  . saxagliptin HCl (ONGLYZA) 2.5 MG TABS tablet Take 2.5 mg by mouth daily. (Patient not taking: Reported on 12/10/2019)    . zolpidem (AMBIEN) 10 MG tablet Take 10 mg by mouth at bedtime as needed for sleep. (Patient not taking: Reported on 12/10/2019)     No current facility-administered medications for this visit.    Review of Systems  Constitutional: Negative for chills, diaphoresis, fever, malaise/fatigue and weight loss (gaining weight back after treatment).  HENT: Negative for congestion, ear discharge, ear pain, hearing loss, nosebleeds, sinus pain, sore throat and tinnitus.        Runny nose in the mornings  Eyes: Negative for blurred vision.  Respiratory: Negative for cough, hemoptysis, sputum production and shortness of breath.   Cardiovascular: Negative for chest pain, palpitations and leg swelling.  Gastrointestinal: Positive for abdominal pain (epigastric, on and off). Negative for blood in stool,  constipation, diarrhea, heartburn, melena, nausea and vomiting.  Genitourinary: Positive for frequency (nocturia, 3-4 x per night). Negative for dysuria, hematuria and urgency.  Musculoskeletal: Negative for back pain, joint pain, myalgias and neck pain.  Skin: Negative for itching and rash.  Neurological: Negative for dizziness, tingling, sensory change, weakness and headaches.  Endo/Heme/Allergies: Does not bruise/bleed easily.       Diabetes, well controlled  Psychiatric/Behavioral: Negative for depression and memory loss. The patient is not nervous/anxious and does not have insomnia.   All other systems reviewed and are negative.   Performance status (ECOG): 1  Vitals Blood pressure (!) 158/65, pulse 84, temperature 97.7 F (36.5 C), temperature source Tympanic, resp. rate 18, weight 211 lb 13.8 oz (96.1 kg), SpO2 100 %.   Physical Exam Vitals and nursing note reviewed.  Constitutional:      General: He is not in acute distress.    Appearance: He is not  diaphoretic.  HENT:     Head: Normocephalic and atraumatic.     Mouth/Throat:     Mouth: Mucous membranes are moist.     Pharynx: Oropharynx is clear.  Eyes:     General: No scleral icterus.    Extraocular Movements: Extraocular movements intact.     Pupils: Pupils are equal, round, and reactive to light.     Comments: Pterygium.  Cardiovascular:     Rate and Rhythm: Normal rate and regular rhythm.     Heart sounds: Normal heart sounds. No murmur heard.   Pulmonary:     Effort: Pulmonary effort is normal. No respiratory distress.     Breath sounds: Normal breath sounds. No wheezing or rales.  Chest:     Chest wall: No tenderness.  Abdominal:     General: Bowel sounds are normal. There is no distension.     Palpations: Abdomen is soft. There is hepatomegaly (palpable 2 fingerbreaths below the right costal margin). There is no mass.     Tenderness: There is no abdominal tenderness. There is no guarding or rebound.   Musculoskeletal:        General: No swelling or tenderness. Normal range of motion.     Cervical back: Normal range of motion and neck supple.  Lymphadenopathy:     Head:     Right side of head: No preauricular, posterior auricular or occipital adenopathy.     Left side of head: No preauricular, posterior auricular or occipital adenopathy.     Cervical: No cervical adenopathy.     Upper Body:     Right upper body: No supraclavicular or axillary adenopathy.     Left upper body: No supraclavicular or axillary adenopathy.     Lower Body: No right inguinal adenopathy. No left inguinal adenopathy.  Skin:    General: Skin is warm and dry.  Neurological:     Mental Status: He is alert and oriented to person, place, and time.  Psychiatric:        Behavior: Behavior normal.        Thought Content: Thought content normal.        Judgment: Judgment normal.    Imaging studies: 03/14/2019:  CT angiogram revealed a 5.3 cm enhancing mass in the central right lobe of the liver, concerning for malignancy.   03/26/2019:  Liver MRI revealed a 4.8 x 4.5 x 4.6 cm in size, involving hepatic segments VIII and V. 04/30/2019:  PET scan revealed a 4.5 cm subtle lesion in the right lobe of the liver without increased FDG uptake. There was no focal uptake elsewhere to suggest metastatic disease or other malignancy.  Head MRI was unremarkable. 05/03/2019:  Abdominal CT (liver) with and without contrast revealed hepatic cirrhosis with sequelae of portal hypertension. There was segment V 5.1 x 5.6 cm hepatocellular carcinoma with fiducial marker along its posterior inferior aspect. This mass abutted but did not definitively invade the adjacent gallbladder. 05/17/2019:  Abdominal CT without contrast revealed a cirrhotic liver with 5.6 cm hypoattenuating mass in segment VIII with fiducial marker present. There was stigmata of portal hypertension. There was cholelithiasis and atherosclerosis.  07/26/2019:  Abdominal  MRI with and without contrast revealed stable hepatocellular carcinoma centered in hepatic segment VIII and V measuring 5.9 x 5.4 x 4.6 cm. There was cirrhosis with similar sequela of portal hypertension including small perigastric/perisplenic varices and mild splenomegaly (13.7 cm). 10/26/2019:  Abdominal MRI with and without contrast at [facility] in [place] revealed  decreased size of the segment VIII mass c/w hepatocellular carcinoma which demonstrated viable tumor with thick rim of enhancement (previously 6 x 5.4 x 4.8 cm, now 5 x 4.3 x 4.4 cm). There were no new suspicious hepatic lesions. There was cirrhosis with portal hypertension including splenomegaly (13.5 cm) and small upper abdominal collaterals.   No visits with results within 3 Day(s) from this visit.  Latest known visit with results is:  Office Visit on 12/07/2016  Component Date Value Ref Range Status  . Tech Review 12/07/2016 PLATELETS APPEAR DECREASED   Final   PLATELETS VARY IN SIZE.  LARGE BODY PLATELETS NOTED ON SMEAR  . Kappa free light chain 12/07/2016 19.0  3.3 - 19.4 mg/L Final  . Lamda free light chains 12/07/2016 15.5  5.7 - 26.3 mg/L Final  . Kappa, lamda light chain ratio 12/07/2016 1.23  0.26 - 1.65 Final   Comment: (NOTE) Performed At: [facility] [place], [place] [id number] Rush Farmer MD WU:[id number]   . H Pylori IgG 12/07/2016 <0.80  0.00 - 0.79 Index Value Final   Comment: (NOTE)                             Negative           <0.80                             Equivocal    0.80 - 0.89                             Positive           >0.89 Performed At: [facility] [address] [place], [place] [id number] Rush Farmer MD VH:[id number]   . IgG (Immunoglobin G), Serum 12/07/2016 1,005  700 - 1,600 mg/dL Final  . IgA 12/07/2016 147  61 - 437 mg/dL Final  . IgM (Immunoglobulin M), Srm 12/07/2016 58  20 - 172 mg/dL Final  . Total Protein ELP  12/07/2016 6.6  6.0 - 8.5 g/dL Corrected  . Albumin SerPl Elph-Mcnc 12/07/2016 3.9  2.9 - 4.4 g/dL Corrected  . Alpha 1 12/07/2016 0.2  0.0 - 0.4 g/dL Corrected  . Alpha2 Glob SerPl Elph-Mcnc 12/07/2016 0.6  0.4 - 1.0 g/dL Corrected  . B-Globulin SerPl Elph-Mcnc 12/07/2016 0.9  0.7 - 1.3 g/dL Corrected  . Gamma Glob SerPl Elph-Mcnc 12/07/2016 1.0  0.4 - 1.8 g/dL Corrected  . M Protein SerPl Elph-Mcnc 12/07/2016 Not Observed  Not Observed g/dL Corrected  . Globulin, Total 12/07/2016 2.7  2.2 - 3.9 g/dL Corrected  . Albumin/Glob SerPl 12/07/2016 1.5  0.7 - 1.7 Corrected  . IFE 1 12/07/2016 Comment   Corrected   An apparent normal immunofixation pattern.  . Please Note 12/07/2016 Comment   Corrected   Comment: (NOTE) Protein electrophoresis scan will follow via computer, mail, or courier delivery. Performed At: [facility] [place], [place] [id number] Rush Farmer MD UV:[id number]   . HCV Ab 12/07/2016 0.4  0.0 - 0.9 s/co ratio Final   Comment: (NOTE)                                  Negative:     < 0.8  Indeterminate: 0.8 - 0.9                                  Positive:     > 0.9 The CDC recommends that a positive HCV antibody result be followed up with a HCV Nucleic Acid Amplification test (568127). Performed At: [facility] [place], [place] [id number] Rush Farmer MD [id number]   . Hep B Core Total Ab 12/07/2016 Negative  Negative Final   Comment: (NOTE) Performed At: [facility] [place], [place] [id number] Rush Farmer MD VX:[id number]   . HIV Screen 4th Generation wRfx 12/07/2016 Non Reactive  Non Reactive Final   Comment: (NOTE) Performed At: [facility] [address] [place], [place] [id number] Rush Farmer MD JF:[id number]   . Anti Nuclear Antibody(ANA) 12/07/2016 Negative  Negative Final   Comment: (NOTE) Performed At: [facility] [place], [place] [id number] Rush Farmer MD OT:[id number]   . Folate 12/07/2016 33.0  >5.9 ng/mL Final  . Vitamin B-12 12/07/2016 557  180 - 914 pg/mL Final   Comment: (NOTE) This assay is not validated for testing neonatal or myeloproliferative syndrome specimens for Vitamin B12 levels. Performed at [facility], [place] [address], [place], [place] [address]   . aPTT 12/07/2016 31  24 - 36 seconds Final  . Prothrombin Time 12/07/2016 13.3  11.4 - 15.2 seconds Final  . INR 12/07/2016 1.02   Final  . WBC 12/07/2016 6.2  3.8 - 10.6 K/uL Final  . RBC 12/07/2016 4.67  4.40 - 5.90 MIL/uL Final  . Hemoglobin 12/07/2016 15.7  13.0 - 18.0 g/dL Final  . HCT 12/07/2016 45.1  40 - 52 % Final  . MCV 12/07/2016 96.6  80.0 - 100.0 fL Final  . MCH 12/07/2016 33.5  26.0 - 34.0 pg Final  . MCHC 12/07/2016 34.7  32.0 - 36.0 g/dL Final  . RDW 12/07/2016 13.8  11.5 - 14.5 % Final  . Platelets 12/07/2016 114* 150 - 440 K/uL Final  . Neutrophils Relative % 12/07/2016 56  % Final  . Neutro Abs 12/07/2016 3.5  1.4 - 6.5 K/uL Final  . Lymphocytes Relative 12/07/2016 34  % Final  . Lymphs Abs 12/07/2016 2.1  1.0 - 3.6 K/uL Final  . Monocytes Relative 12/07/2016 7  % Final  . Monocytes Absolute 12/07/2016 0.5  0.2 - 1.0 K/uL Final  . Eosinophils Relative 12/07/2016 2  % Final  . Eosinophils Absolute 12/07/2016 0.1  0.0 - 0.7 K/uL Final  . Basophils Relative 12/07/2016 1  % Final  . Basophils Absolute 12/07/2016 0.0  0.0 - 0.1 K/uL Final    Assessment:  Timothy Murray is a 74 y.o. male  with mild thrombocytopenia and clinical stage T2N0 hepatocellular carcinoma s/p proton therapy.  Platele count has ranged between 94,000 - 126,000 since 06/2011.  He denies any new medications or herbal products.  Work-up on 12/07/2016 revealed a hematocrit 45.1, hemoglobin 15.7, MCV 96.6, platelets 114,000, WBC 6200 with an ANC of 3500.  Differential was unremarkable.  Peripheral smear revealed platelets of  various sizes, some large body platelets.  Normal studies included: PT, PTT, SPEP, free light chains, H pylori antibody, hepatitis B core antibody, hepatitis C antibody, HIV testing, ANA, B12, and folate.   He has hepatocellular carcinoma s/p liver FNA on 04/20/2019.  Pathology revealed moderately differentiated (grade 2) hepatocellular carcinoma.  Clinical stage was  T2N0 lesion involving both hepatic segments VIII and V.  He declined surgery.  PET scan on 04/30/2019 revealed a 4.5 cm subtle lesion in the right lobe of the liver without increased FDG uptake. There was no focal uptake elsewhere to suggest metastatic disease or other malignancy.   He completed a course of proton beam radiation. He received 70 CGE in 25 fractions.  Radiation completed on 06/25/2019.  Abdominal MRI on 10/26/2019 at [facility] in [place] revealed decreased size of the segment VIII mass c/w hepatocellular carcinoma which demonstrated viable tumor with thick rim of enhancement (previously 6 x 5.4 x 4.8 cm, now 5 x 4.3 x 4.4 cm). There were no new suspicious hepatic lesions. There was cirrhosis with portal hypertension including splenomegaly (13.5 cm) and small upper abdominal collaterals.  Symptomatically, he has been "ok". He has reflux and on and off epigastric pain. He lost weight after diagnosis of [place]; he is gaining weight back. He denies fevers, sweats, cough, shortness of breath, chest pain, palpitations, early satiety, nausea, vomiting, diarrhea, numbness, weakness, balance or coordination problems, and bleeding of any kind.  Plan: 1.   Labs today:  CBC with diff, IPF, platelet count in a blue top tube, CMP, ferritin, iron studies, hepatitis B and C serologies, AFP. 2.   Thrombocytopenia  Platelet count is 61,000.    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  Interval history is noted for hepatocellular carcinoma and possible hepatitis.  He has mild  splenomegaly (13.5 cm) contributing to thrombocytopenia.  Discuss repeating a few studies as previously performed in 2018.  Continue to monitor without intervention. 3.   Clinical stage T2N0 hepatocellular carcinoma  Patient diagnosed incidentally on follow-up of an aortic stent.  Pathology on 04/20/2019 confirmed grade 2 hepatocellular carcinoma.  Patient was offered proton beam therapy versus surgery.  He elected to pursue radiation.   He completed radiation on 06/25/2019.  Last imaging studies on 10/26/2019 revealed decrease in size of the segment 8 mass insistent with hepatocellular carcinoma.   There was a thick rim of enhancement.  There are no new suspicious lesions.  ROI for [facility] MRI abdomen disc on 10/26/2019. 4.   GI consult (Dr Allen Norris) 5.   Abdomen MRI w and w/o contrast on 01/28/2020. 6.   RTC after MRI for MD assessment, review of imaging, and discussion regarding direction of therapy.  I discussed the assessment and treatment plan with the patient.  The patient was provided an opportunity to ask questions and all were answered.  The patient agreed with the plan and demonstrated an understanding of the instructions.  The patient was advised to call back if the symptoms worsen or if the condition fails to improve as anticipated.  I provided 41 minutes of face-to-face time during this this encounter and > 50% was spent counseling as documented under my assessment and plan.   An additional 10-15 minutes were spent reviewing his chart (Epic and Care Everywhere) including notes, labs, and imaging studies.    Kenlyn Lose C. Mike Gip, MD, PhD    12/10/2019, 3:40 PM  I, De Burrs, am acting as Education administrator for [employer]. Mike Gip, MD, PhD.  I, Estell Puccini C. Mike Gip, MD, have reviewed the above documentation for accuracy and completeness, and I agree with the above.

## 2019-12-10 ENCOUNTER — Inpatient Hospital Stay: Payer: Medicare HMO

## 2019-12-10 ENCOUNTER — Inpatient Hospital Stay: Payer: Medicare HMO | Attending: Hematology and Oncology | Admitting: Hematology and Oncology

## 2019-12-10 ENCOUNTER — Telehealth: Payer: Self-pay

## 2019-12-10 ENCOUNTER — Encounter: Payer: Self-pay | Admitting: Hematology and Oncology

## 2019-12-10 ENCOUNTER — Other Ambulatory Visit: Payer: Self-pay

## 2019-12-10 VITALS — BP 158/65 | HR 84 | Temp 97.7°F | Resp 18 | Wt 211.9 lb

## 2019-12-10 DIAGNOSIS — K766 Portal hypertension: Secondary | ICD-10-CM | POA: Insufficient documentation

## 2019-12-10 DIAGNOSIS — D693 Immune thrombocytopenic purpura: Secondary | ICD-10-CM

## 2019-12-10 DIAGNOSIS — D696 Thrombocytopenia, unspecified: Secondary | ICD-10-CM

## 2019-12-10 DIAGNOSIS — C220 Liver cell carcinoma: Secondary | ICD-10-CM

## 2019-12-10 DIAGNOSIS — E119 Type 2 diabetes mellitus without complications: Secondary | ICD-10-CM

## 2019-12-10 DIAGNOSIS — K7460 Unspecified cirrhosis of liver: Secondary | ICD-10-CM | POA: Diagnosis not present

## 2019-12-10 LAB — FERRITIN: Ferritin: 305 ng/mL (ref 24–336)

## 2019-12-10 LAB — IRON AND TIBC
Iron: 69 ug/dL (ref 45–182)
Saturation Ratios: 19 % (ref 17.9–39.5)
TIBC: 356 ug/dL (ref 250–450)
UIBC: 287 ug/dL

## 2019-12-10 LAB — CBC WITH DIFFERENTIAL/PLATELET
Abs Immature Granulocytes: 0.03 10*3/uL (ref 0.00–0.07)
Basophils Absolute: 0 10*3/uL (ref 0.0–0.1)
Basophils Relative: 1 %
Eosinophils Absolute: 0.1 10*3/uL (ref 0.0–0.5)
Eosinophils Relative: 3 %
HCT: 35.4 % — ABNORMAL LOW (ref 39.0–52.0)
Hemoglobin: 12.8 g/dL — ABNORMAL LOW (ref 13.0–17.0)
Immature Granulocytes: 1 %
Lymphocytes Relative: 27 %
Lymphs Abs: 0.9 10*3/uL (ref 0.7–4.0)
MCH: 33.5 pg (ref 26.0–34.0)
MCHC: 36.2 g/dL — ABNORMAL HIGH (ref 30.0–36.0)
MCV: 92.7 fL (ref 80.0–100.0)
Monocytes Absolute: 0.3 10*3/uL (ref 0.1–1.0)
Monocytes Relative: 9 %
Neutro Abs: 2 10*3/uL (ref 1.7–7.7)
Neutrophils Relative %: 59 %
Platelets: 61 10*3/uL — ABNORMAL LOW (ref 150–400)
RBC: 3.82 MIL/uL — ABNORMAL LOW (ref 4.22–5.81)
RDW: 14.5 % (ref 11.5–15.5)
WBC: 3.3 10*3/uL — ABNORMAL LOW (ref 4.0–10.5)
nRBC: 0 % (ref 0.0–0.2)

## 2019-12-10 LAB — COMPREHENSIVE METABOLIC PANEL
ALT: 35 U/L (ref 0–44)
AST: 33 U/L (ref 15–41)
Albumin: 4 g/dL (ref 3.5–5.0)
Alkaline Phosphatase: 65 U/L (ref 38–126)
Anion gap: 10 (ref 5–15)
BUN: 12 mg/dL (ref 8–23)
CO2: 25 mmol/L (ref 22–32)
Calcium: 8.4 mg/dL — ABNORMAL LOW (ref 8.9–10.3)
Chloride: 101 mmol/L (ref 98–111)
Creatinine, Ser: 1.06 mg/dL (ref 0.61–1.24)
GFR, Estimated: >60 mL/min (ref >60)
Glucose, Bld: 180 mg/dL — ABNORMAL HIGH (ref 70–99)
Potassium: 3.4 mmol/L — ABNORMAL LOW (ref 3.5–5.1)
Sodium: 136 mmol/L (ref 135–145)
Total Bilirubin: 0.8 mg/dL (ref 0.3–1.2)
Total Protein: 7.1 g/dL (ref 6.5–8.1)

## 2019-12-10 LAB — PLATELET BY CITRATE: Platelet CT in Citrate: 57.2

## 2019-12-10 LAB — IMMATURE PLATELET FRACTION: Immature Platelet Fraction: 4 % (ref 1.2–8.6)

## 2019-12-11 LAB — AFP TUMOR MARKER: AFP, Serum, Tumor Marker: 3.7 ng/mL (ref 0.0–8.3)

## 2019-12-11 LAB — HEPATITIS C ANTIBODY: HCV Ab: NONREACTIVE

## 2019-12-11 LAB — HEPATITIS B SURFACE ANTIGEN: Hepatitis B Surface Ag: NONREACTIVE

## 2019-12-11 LAB — HEPATITIS B CORE ANTIBODY, TOTAL: Hep B Core Total Ab: NONREACTIVE

## 2019-12-19 ENCOUNTER — Other Ambulatory Visit: Payer: Self-pay

## 2019-12-19 DIAGNOSIS — C220 Liver cell carcinoma: Secondary | ICD-10-CM

## 2019-12-20 ENCOUNTER — Other Ambulatory Visit (HOSPITAL_COMMUNITY): Payer: Self-pay | Admitting: General Surgery

## 2019-12-20 ENCOUNTER — Ambulatory Visit
Admission: RE | Admit: 2019-12-20 | Discharge: 2019-12-20 | Disposition: A | Discharge status: Home / Self Care | Payer: Self-pay | Source: Ambulatory Visit | Attending: Hematology and Oncology | Admitting: Hematology and Oncology

## 2019-12-20 ENCOUNTER — Other Ambulatory Visit: Payer: Self-pay | Admitting: Hematology and Oncology

## 2019-12-20 DIAGNOSIS — C220 Liver cell carcinoma: Secondary | ICD-10-CM

## 2019-12-20 DIAGNOSIS — K8020 Calculus of gallbladder without cholecystitis without obstruction: Secondary | ICD-10-CM

## 2019-12-24 ENCOUNTER — Other Ambulatory Visit: Payer: Self-pay

## 2019-12-24 ENCOUNTER — Telehealth: Payer: Self-pay | Admitting: *Deleted

## 2019-12-24 DIAGNOSIS — Z5321 Procedure and treatment not carried out due to patient leaving prior to being seen by health care provider: Secondary | ICD-10-CM | POA: Insufficient documentation

## 2019-12-24 DIAGNOSIS — K219 Gastro-esophageal reflux disease without esophagitis: Secondary | ICD-10-CM | POA: Insufficient documentation

## 2019-12-24 DIAGNOSIS — R1084 Generalized abdominal pain: Secondary | ICD-10-CM | POA: Diagnosis present

## 2019-12-24 NOTE — Telephone Encounter (Signed)
  Roxie,  Was this the disk that came last week and was sent to radiology?  Please follow-up with patient.  M

## 2019-12-24 NOTE — ED Triage Notes (Signed)
Pt in with co generalized abd pain for 3 weeks that worsened today. Denies any n.v.d. or dysuria. Pt has hx of reflux but unsure of similar symptoms.

## 2019-12-24 NOTE — Telephone Encounter (Signed)
Patient called asking if we were able to get the disc from [facility] in [place] or not. Please return his call regarding this

## 2019-12-25 ENCOUNTER — Emergency Department
Admission: EM | Admit: 2019-12-25 | Discharge: 2019-12-25 | Disposition: A | Discharge status: Home / Self Care | Payer: Medicare HMO | Attending: Emergency Medicine | Admitting: Emergency Medicine

## 2019-12-25 ENCOUNTER — Other Ambulatory Visit: Payer: Self-pay

## 2019-12-25 ENCOUNTER — Emergency Department
Admission: RE | Admit: 2019-12-25 | Discharge: 2019-12-25 | Disposition: A | Discharge status: Home / Self Care | Payer: Medicare HMO | Source: Ambulatory Visit | Attending: General Surgery | Admitting: General Surgery

## 2019-12-25 DIAGNOSIS — K8020 Calculus of gallbladder without cholecystitis without obstruction: Secondary | ICD-10-CM | POA: Insufficient documentation

## 2019-12-25 LAB — COMPREHENSIVE METABOLIC PANEL
ALT: 46 U/L — ABNORMAL HIGH (ref 0–44)
AST: 42 U/L — ABNORMAL HIGH (ref 15–41)
Albumin: 4.5 g/dL (ref 3.5–5.0)
Alkaline Phosphatase: 74 U/L (ref 38–126)
Anion gap: 9 (ref 5–15)
BUN: 20 mg/dL (ref 8–23)
CO2: 26 mmol/L (ref 22–32)
Calcium: 8.9 mg/dL (ref 8.9–10.3)
Chloride: 100 mmol/L (ref 98–111)
Creatinine, Ser: 1.28 mg/dL — ABNORMAL HIGH (ref 0.61–1.24)
GFR, Estimated: 59 mL/min — ABNORMAL LOW (ref >60)
Glucose, Bld: 203 mg/dL — ABNORMAL HIGH (ref 70–99)
Potassium: 3.9 mmol/L (ref 3.5–5.1)
Sodium: 135 mmol/L (ref 135–145)
Total Bilirubin: 1.6 mg/dL — ABNORMAL HIGH (ref 0.3–1.2)
Total Protein: 7.6 g/dL (ref 6.5–8.1)

## 2019-12-25 LAB — URINALYSIS, COMPLETE (UACMP) WITH MICROSCOPIC
Bacteria, UA: NONE SEEN
Bilirubin Urine: NEGATIVE
Glucose, UA: 50 mg/dL — AB
Hgb urine dipstick: NEGATIVE
Ketones, ur: NEGATIVE mg/dL
Leukocytes,Ua: NEGATIVE
Nitrite: NEGATIVE
Protein, ur: NEGATIVE mg/dL
Specific Gravity, Urine: 1.02 (ref 1.005–1.030)
Squamous Epithelial / HPF: NONE SEEN (ref 0–5)
pH: 5 (ref 5.0–8.0)

## 2019-12-25 LAB — CBC
HCT: 39.5 % (ref 39.0–52.0)
Hemoglobin: 14.1 g/dL (ref 13.0–17.0)
MCH: 33.6 pg (ref 26.0–34.0)
MCHC: 35.7 g/dL (ref 30.0–36.0)
MCV: 94 fL (ref 80.0–100.0)
Platelets: 65 10*3/uL — ABNORMAL LOW (ref 150–400)
RBC: 4.2 MIL/uL — ABNORMAL LOW (ref 4.22–5.81)
RDW: 14.6 % (ref 11.5–15.5)
WBC: 5.3 10*3/uL (ref 4.0–10.5)
nRBC: 0 % (ref 0.0–0.2)

## 2019-12-25 LAB — LIPASE, BLOOD: Lipase: 43 U/L (ref 11–51)

## 2019-12-25 IMAGING — US US ABDOMEN LIMITED
1 series · 13 of 25 positions shown · non-contrast
Comparison: None; correlation CT abdomen and pelvis [DATE]

CLINICAL DATA: Intermittent abdominal pain for 1 month,
cholelithiasis without cholangitis

EXAM:
ULTRASOUND ABDOMEN LIMITED RIGHT UPPER QUADRANT

[Series 1: us abdomen limited ruq (liver/gb) · 13 of 40 slices shown]
[im 1/40]
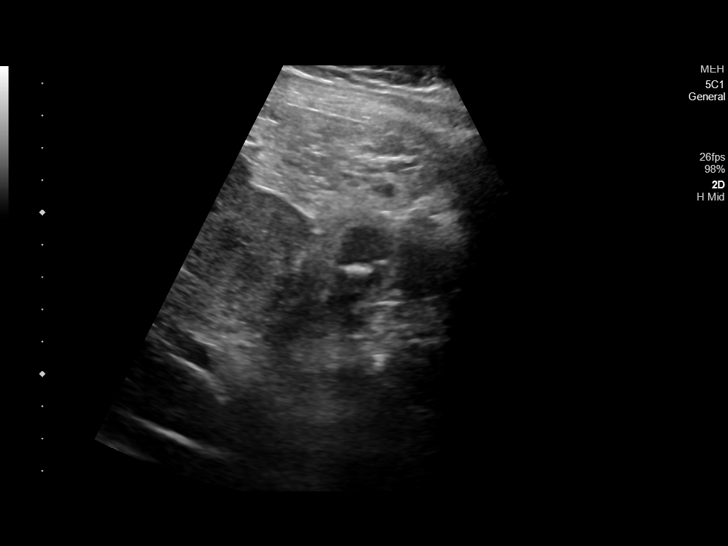
[im 4/40]
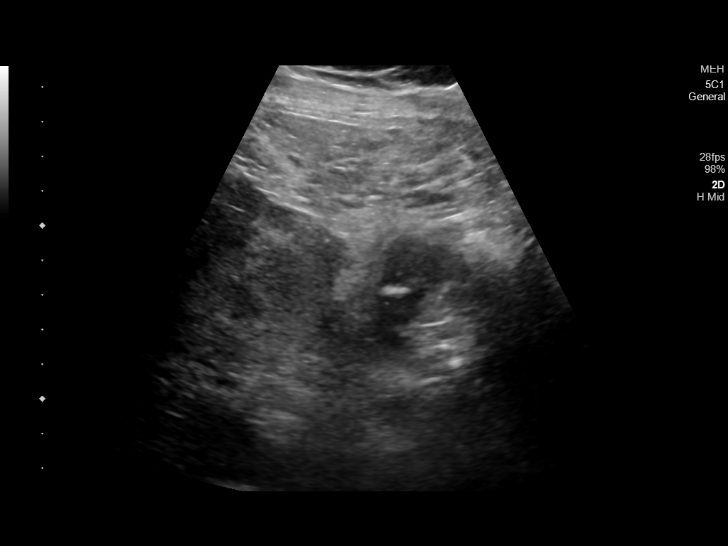
[im 7/40]
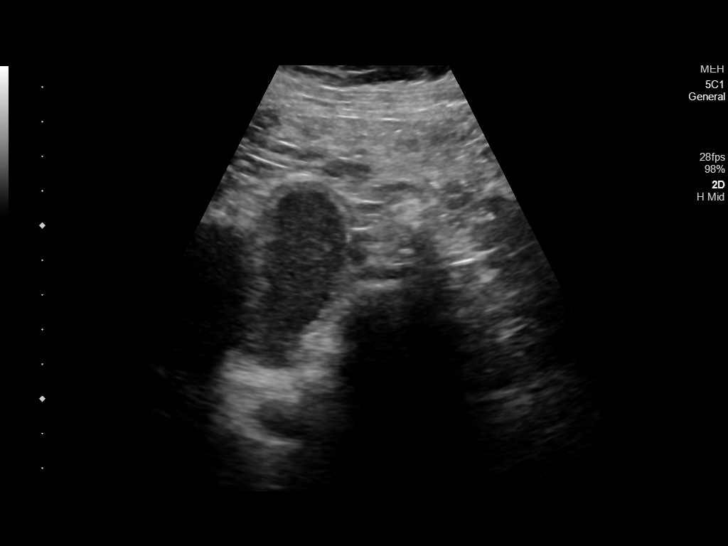
[im 10/40]
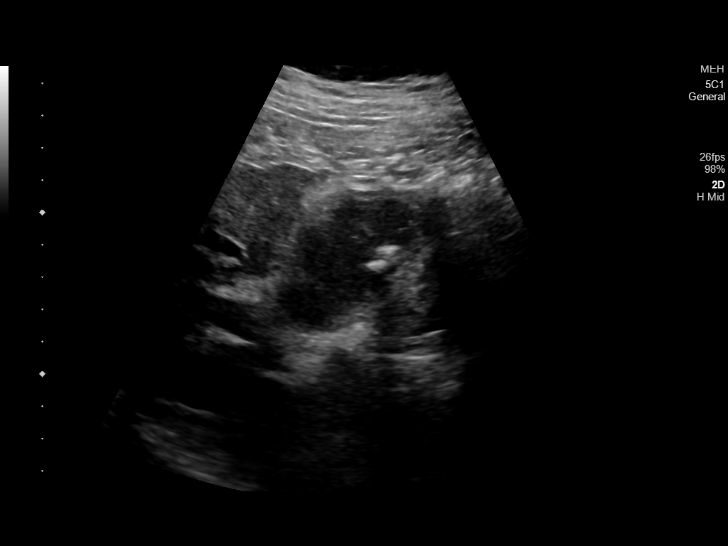
[im 14/40]
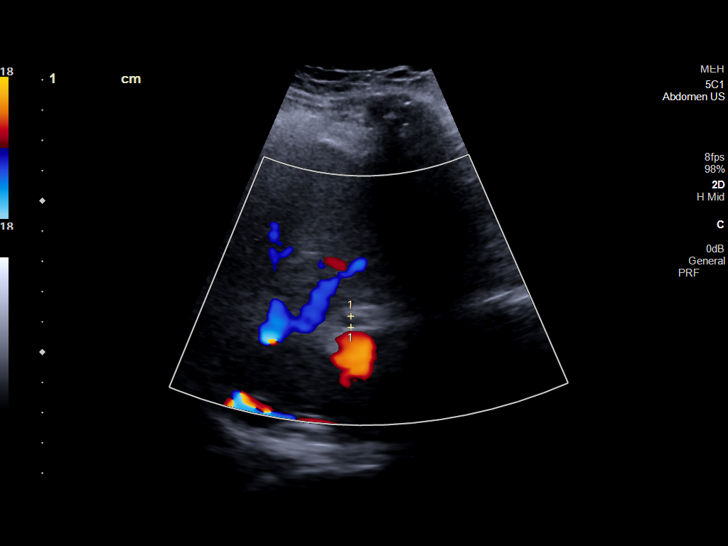
[im 17/40]
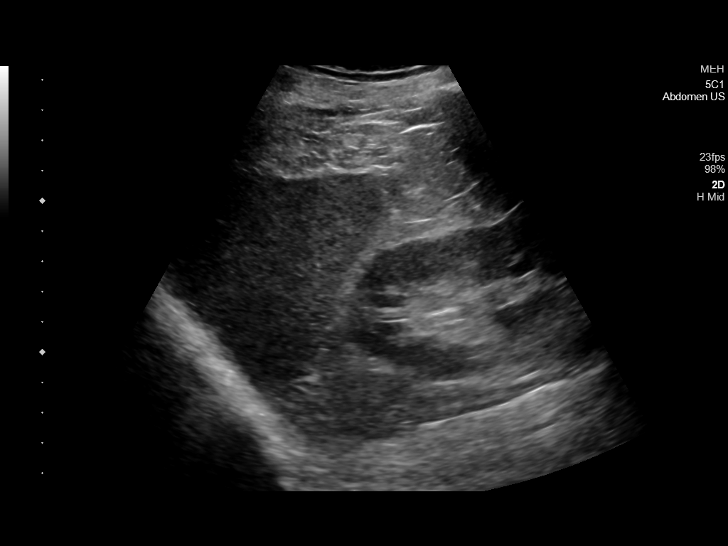
[im 20/40]
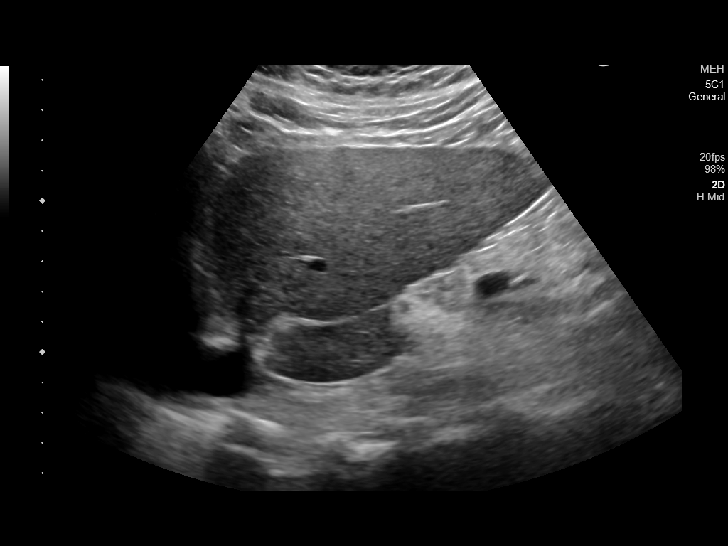
[im 23/40]
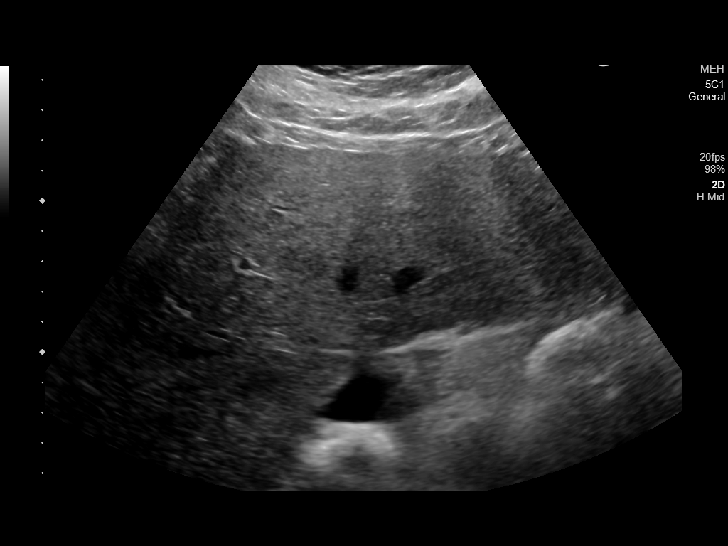
[im 27/40]
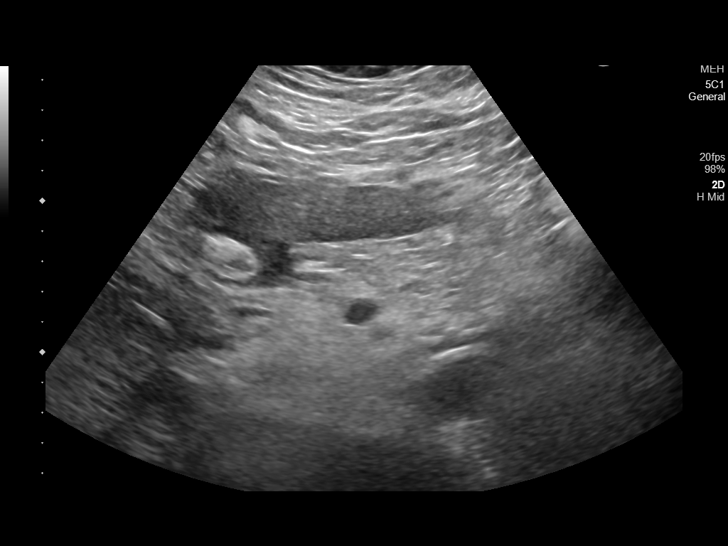
[im 30/40]
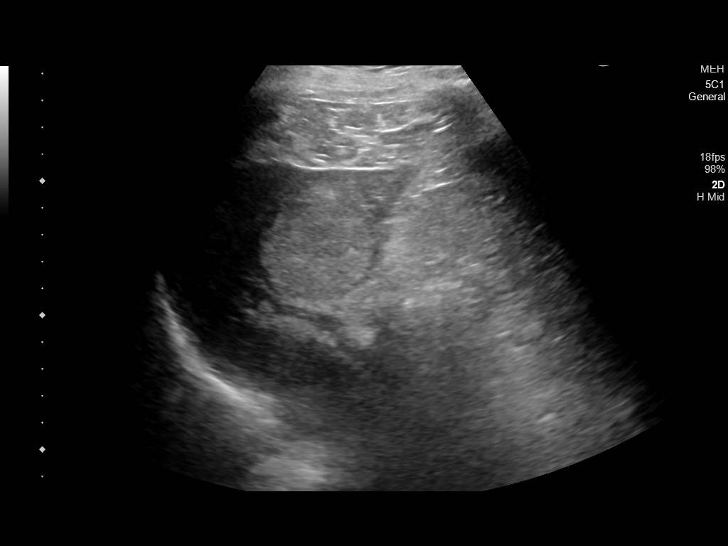
[im 33/40]
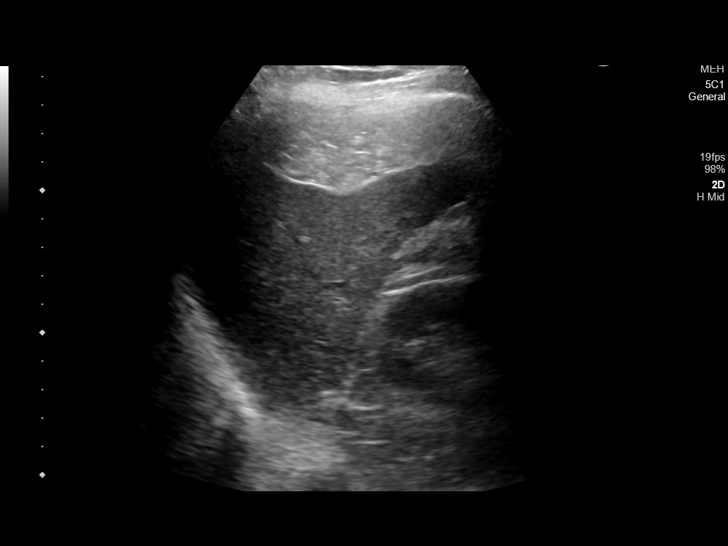
[im 36/40]
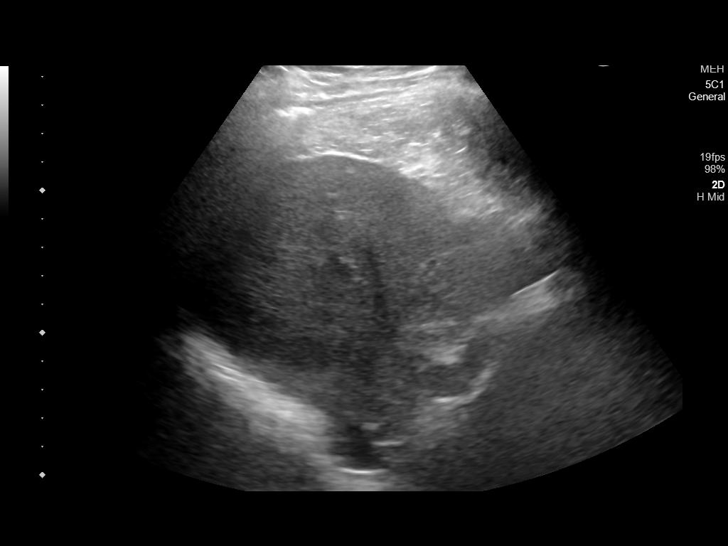
[im 40/40]
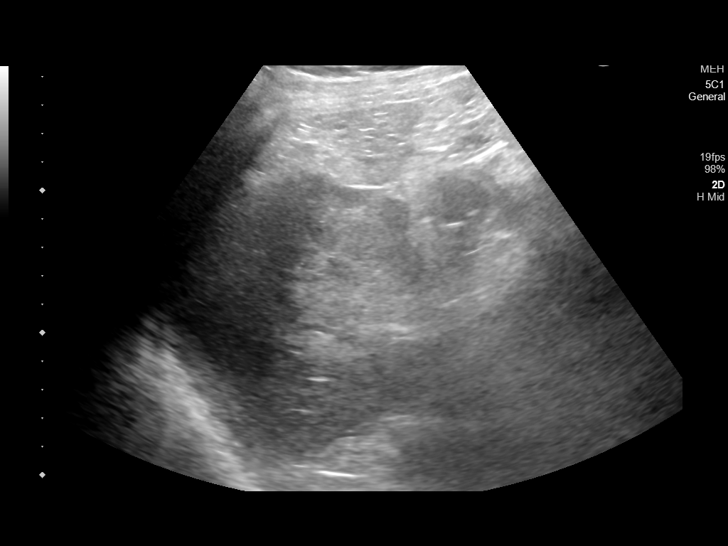

[13 of 25 positions shown; findings below may reference images not displayed]

FINDINGS: Gallbladder:

Multiple calculi including a 10 mm calculus at lower gallbladder
segment. Sludge filling remainder of gallbladder. Gallbladder wall
appears thickened. No pericholecystic fluid or sonographic Murphy
sign.

Common bile duct:

Diameter: 4 mm, normal

Liver:

Heterogeneous echogenicity. Slightly nodular contour. Question of
cirrhosis is raised. Hyperechoic mass RIGHT lobe 4.1 x 4.0 x 3.9 cm,
not seen on prior CT exam. No additional masses. Portal vein is
patent on color Doppler imaging with normal direction of blood flow
towards the liver.

Other: No RIGHT upper quadrant free fluid.
IMPRESSION: Cholelithiasis and sludge within gallbladder with gallbladder wall
thickening but no sonographic Murphy sign or pericholecystic fluid
to suggest acute cholecystitis; if clinically suspicious for acute
cholecystitis, may consider radionuclide hepatobiliary imaging.

Suspicion of a cirrhotic liver with a 4.1 cm diameter hyperechoic
mass RIGHT lobe liver, not seen on prior CT exam; characterization
of this lesion by MR imaging with and without contrast recommended.

These results will be called to the ordering clinician or
representative by the Radiologist Assistant, and communication
documented in the PACS or [REDACTED].

## 2019-12-25 NOTE — ED Notes (Signed)
No answer when called several times from lobby 

## 2019-12-25 NOTE — Progress Notes (Signed)
[facility]  [address] [place], [place] [address] Phone: [phone]  Fax: [phone]   Clinic Day:  12/26/2019  Referring physician: Danelle Berry, NP  Chief Complaint: Timothy Murray is a 74 y.o. male with hepatocellular carcinoma and thrombocytopenia who is seen for sick call visit secondary to abdominal pain.  HPI: The patient was last seen in the hematology clinic on 12/10/2019 for reassessment. At that time, he felt "ok".  He noted reflux and intermittent epigastric pain. He had lost 8 pounds.  He was to see GI.  Labs at last visit revealed a hematocrit of 35.4, hemoglobin 12.8, MCV 92.7, platelets 61,000, WBC 3,300 (ANC 2,000). Platelet count in a blue top tube was 57,200. Immature platelet fraction was 4.0%. Potassium was 3.4 and calcium 8.4. Ferritin was 305 with an iron saturation of 19% and a TIBC of 356.  AFP was 3.7.  Hepatitis B core antibody, hepatitis B surface antigen, and hepatitis C antibody were non reactive.  The patient saw Dr. Windell Moment on 12/20/2019. He reported abdominal pain for the last few months; pain was predominantly in the epigastric region and RUQ. Pain was aggravated by oily foods. The patient was referred to hepatobiliary surgery at [facility]  They discussed an abdominal ultrasound to evaluate gallstones.  The patient presented to the [facility] ER on 12/25/2019 with abdominal pain x3 weeks.  Labs revealed a hematocrit of 39.5, hemoglobin 14.1, MCV 94, platelets 65,000, WBC 5300.  Comprehensive metabolic panel included a creatinine of 1.28, AST 42, ALT 46, alkaline phosphatase 74, and bilirubin 1.6.  Lipase was 43.  Urinalysis was negative.  There was no answer when he was called back.   RUQ ultrasound on 12/25/2019 revealed cholelithiasis and sludge within gallbladder with gallbladder wall thickening but no sonographic Murphy sign or pericholecystic fluid to suggest acute cholecystitis; if clinically suspicious for acute  cholecystitis, may consider radionuclide hepatobiliary imaging.  There was suspicion of a cirrhotic liver with a 4.1 cm diameter hyperechoic mass RIGHT lobe liver, not seen on prior CT exam; characterization of this lesion by MR imaging with and without contrast was recommended. Marland Kitchen He is scheduled for an abdominal MRI with and without contrast on 01/28/2020.  Symptomatically, he feels "fine".  He has abdominal pain which comes and goes.  Two nights ago he had left-sided abdominal pain which radiated to his right side.  Pain was a 9 out of 10.  He took Tylenol did not go away.  Pain did not resolve with Prilosec.  He will be seeing Dr. Cristino Martes at [facility] in the near future.   Past Medical History:  Diagnosis Date  . Arthritis    "lower back" (10/24/2012)  . Coronary artery disease   . GERD (gastroesophageal reflux disease)   . High cholesterol   . Hypertension   . Type II diabetes mellitus ([place])     Past Surgical History:  Procedure Laterality Date  . APPENDECTOMY  2002  . CARDIAC CATHETERIZATION  10/24/2012  . CORONARY ARTERY BYPASS GRAFT N/A 10/26/2012   Procedure: CORONARY ARTERY BYPASS GRAFTING (CABG);  Surgeon: Melrose Nakayama, MD;  Location: [facility];  Service: Open Heart Surgery;  Laterality: N/A;  Times 3 using left internal mammary artery and endoscopically harvested right saphenous vein    Family History  Problem Relation Age of Onset  . Cancer Sister     Social History:  reports that he quit smoking about 18 years ago. His smoking use included cigarettes. He has  a 15.00 pack-year smoking history. He has never used smokeless tobacco. He reports that he does not drink alcohol and does not use drugs. He stopped smoking and drinking in alcohol in 2003. Before that, he drank a couple of times per week. The patient's wife's name is [name].  Allergies:  Allergies  Allergen Reactions  . Other Nausea And Vomiting  . Soap & Cleansers     "liquid soap" antibacterial -rash  .  Latex Rash and Itching    Current Medications: Current Outpatient Medications  Medication Sig Dispense Refill  . atorvastatin (LIPITOR) 20 MG tablet Take 1 tablet (20 mg total) by mouth daily at 6 PM. (Patient taking differently: Take 40 mg by mouth daily at 6 PM.) 30 tablet 1  . fluocinonide cream (LIDEX) 0.05 %     . glimepiride (AMARYL) 1 MG tablet Take 1 mg by mouth daily.    Marland Kitchen glucosamine-chondroitin 500-400 MG tablet Take 1 tablet by mouth daily.    Marland Kitchen losartan (COZAAR) 25 MG tablet Take 25 mg by mouth daily.    . metoprolol tartrate (LOPRESSOR) 25 MG tablet Take 25 mg by mouth daily.    Marland Kitchen omeprazole (PRILOSEC) 20 MG capsule Take 40 mg by mouth every morning.    Marland Kitchen allopurinol (ZYLOPRIM) 100 MG tablet Take 100 mg by mouth daily. (Patient not taking: No sig reported)  3  . aspirin EC 81 MG tablet Take 81 mg by mouth daily. (Patient not taking: No sig reported)    . furosemide (LASIX) 40 MG tablet Take 40 mg by mouth daily. (Patient not taking: No sig reported)    . ibuprofen (ADVIL,MOTRIN) 600 MG tablet Take 1 tablet (600 mg total) by mouth every 6 (six) hours as needed. (Patient not taking: No sig reported) 30 tablet 0  . Multiple Vitamins-Minerals (MEGA MULTIVITAMIN FOR MEN) TABS Take 1 tablet by mouth daily. (Patient not taking: No sig reported)    . niacin 500 MG CR capsule Take 1 capsule (500 mg total) by mouth at bedtime. (Patient not taking: No sig reported) 30 capsule 1  . oxyCODONE-acetaminophen (PERCOCET) 5-325 MG tablet Take 1 tablet by mouth every 4 (four) hours as needed for severe pain. 20 tablet 0  . potassium chloride SA (K-DUR,KLOR-CON) 20 MEQ tablet Take 20 mEq by mouth daily. (Patient not taking: Reported on 12/26/2019)    . saxagliptin HCl (ONGLYZA) 2.5 MG TABS tablet Take 2.5 mg by mouth daily. (Patient not taking: No sig reported)    . zolpidem (AMBIEN) 10 MG tablet Take 10 mg by mouth at bedtime as needed for sleep. (Patient not taking: No sig reported)     No  current facility-administered medications for this visit.    Review of Systems  Constitutional: Positive for weight loss (3 pounds). Negative for chills, diaphoresis, fever and malaise/fatigue.       Feels "fine".  HENT: Negative for congestion, ear discharge, ear pain, nosebleeds, sinus pain and sore throat.   Eyes: Negative.  Negative for blurred vision, double vision and photophobia.  Respiratory: Negative.  Negative for cough, hemoptysis, sputum production and shortness of breath.   Cardiovascular: Negative.  Negative for chest pain, palpitations, orthopnea, claudication and leg swelling.  Gastrointestinal: Negative for blood in stool, diarrhea, heartburn, melena, nausea and vomiting.       Intermittent upper abdominal pain.  Genitourinary: Positive for frequency (nocturia, 3-4 x per night). Negative for dysuria, flank pain, hematuria and urgency.  Musculoskeletal: Negative.  Negative for back pain, joint pain,  myalgias and neck pain.  Skin: Negative.  Negative for itching and rash.  Neurological: Negative for dizziness, tingling, tremors, sensory change, speech change, focal weakness, weakness and headaches.  Endo/Heme/Allergies: Does not bruise/bleed easily.       Diabetes.  Psychiatric/Behavioral: Negative for depression and memory loss. The patient is not nervous/anxious and does not have insomnia.   All other systems reviewed and are negative.  Performance status (ECOG): 1  Vitals Blood pressure (!) 107/58, pulse 73, temperature 98.2 F (36.8 C), temperature source Tympanic, weight 208 lb 15.9 oz (94.8 kg), SpO2 100 %.   Physical Exam Vitals and nursing note reviewed.  Constitutional:      General: He is not in acute distress.    Appearance: Normal appearance. He is not ill-appearing or toxic-appearing.  HENT:     Head: Normocephalic and atraumatic.     Mouth/Throat:     Mouth: Mucous membranes are moist.     Pharynx: Oropharynx is clear. No oropharyngeal exudate or  posterior oropharyngeal erythema.  Eyes:     General: No scleral icterus.    Conjunctiva/sclera: Conjunctivae normal.     Pupils: Pupils are equal, round, and reactive to light.     Comments: Glasses.  Cardiovascular:     Rate and Rhythm: Normal rate and regular rhythm.  Pulmonary:     Effort: No respiratory distress.     Breath sounds: Normal breath sounds. No wheezing, rhonchi or rales.  Chest:  Breasts:     Right: No axillary adenopathy or supraclavicular adenopathy.     Left: No axillary adenopathy or supraclavicular adenopathy.    Abdominal:     General: Abdomen is flat. There is no distension.     Palpations: There is no splenomegaly or mass.     Tenderness: There is abdominal tenderness (slight RUQ tenderness). There is no guarding or rebound.     Comments: Liver edge palpable.  Musculoskeletal:        General: No swelling or tenderness.     Right lower leg: No edema.     Left lower leg: No edema.  Lymphadenopathy:     Head:     Right side of head: No preauricular, posterior auricular or occipital adenopathy.     Left side of head: No preauricular, posterior auricular or occipital adenopathy.     Cervical: No cervical adenopathy.     Upper Body:     Right upper body: No supraclavicular or axillary adenopathy.     Left upper body: No supraclavicular or axillary adenopathy.     Lower Body: No right inguinal adenopathy. No left inguinal adenopathy.  Skin:    General: Skin is warm and dry.     Coloration: Skin is not jaundiced or pale.     Findings: No bruising, erythema or rash.  Neurological:     General: No focal deficit present.     Mental Status: He is alert and oriented to person, place, and time. Mental status is at baseline.  Psychiatric:        Mood and Affect: Mood normal.        Behavior: Behavior normal.        Thought Content: Thought content normal.        Judgment: Judgment normal.    Admission on 12/25/2019, Discharged on 12/25/2019  Component Date  Value Ref Range Status  . WBC 12/25/2019 5.3  4.0 - 10.5 K/uL Final  . RBC 12/25/2019 4.20* 4.22 - 5.81 MIL/uL Final  . Hemoglobin 12/25/2019  14.1  13.0 - 17.0 g/dL Final  . HCT 12/25/2019 39.5  39.0 - 52.0 % Final  . MCV 12/25/2019 94.0  80.0 - 100.0 fL Final  . MCH 12/25/2019 33.6  26.0 - 34.0 pg Final  . MCHC 12/25/2019 35.7  30.0 - 36.0 g/dL Final  . RDW 12/25/2019 14.6  11.5 - 15.5 % Final  . Platelets 12/25/2019 65* 150 - 400 K/uL Final   Comment: Immature Platelet Fraction may be clinically indicated, consider ordering this additional test [id number]   . nRBC 12/25/2019 0.0  0.0 - 0.2 % Final   Performed at [facility], [address] [place], [place] [address]  . Sodium 12/25/2019 135  135 - 145 mmol/L Final  . Potassium 12/25/2019 3.9  3.5 - 5.1 mmol/L Final  . Chloride 12/25/2019 100  98 - 111 mmol/L Final  . CO2 12/25/2019 26  22 - 32 mmol/L Final  . Glucose, Bld 12/25/2019 203* 70 - 99 mg/dL Final   Glucose reference range applies only to samples taken after fasting for at least 8 hours.  . BUN 12/25/2019 20  8 - 23 mg/dL Final  . Creatinine, Ser 12/25/2019 1.28* 0.61 - 1.24 mg/dL Final  . Calcium 12/25/2019 8.9  8.9 - 10.3 mg/dL Final  . Total Protein 12/25/2019 7.6  6.5 - 8.1 g/dL Final  . Albumin 12/25/2019 4.5  3.5 - 5.0 g/dL Final  . AST 12/25/2019 42* 15 - 41 U/L Final  . ALT 12/25/2019 46* 0 - 44 U/L Final  . Alkaline Phosphatase 12/25/2019 74  38 - 126 U/L Final  . Total Bilirubin 12/25/2019 1.6* 0.3 - 1.2 mg/dL Final  . GFR, Estimated 12/25/2019 59* >60 mL/min Final   Comment: (NOTE) Calculated using the CKD-EPI Creatinine Equation (2021)   . Anion gap 12/25/2019 9  5 - 15 Final   Performed at [facility], [address] [place], [place] [address]  . Lipase 12/25/2019 43  11 - 51 U/L Final   Performed at [facility], [address] [place], [place] [address]  . Color, Urine 12/25/2019 YELLOW* YELLOW Final  . APPearance  12/25/2019 CLEAR* CLEAR Final  . Specific Gravity, Urine 12/25/2019 1.020  1.005 - 1.030 Final  . pH 12/25/2019 5.0  5.0 - 8.0 Final  . Glucose, UA 12/25/2019 50* NEGATIVE mg/dL Final  . Hgb urine dipstick 12/25/2019 NEGATIVE  NEGATIVE Final  . Bilirubin Urine 12/25/2019 NEGATIVE  NEGATIVE Final  . Ketones, ur 12/25/2019 NEGATIVE  NEGATIVE mg/dL Final  . Protein, ur 12/25/2019 NEGATIVE  NEGATIVE mg/dL Final  . Nitrite 12/25/2019 NEGATIVE  NEGATIVE Final  . Chalmers Guest 12/25/2019 NEGATIVE  NEGATIVE Final  . WBC, UA 12/25/2019 0-5  0 - 5 WBC/hpf Final  . Bacteria, UA 12/25/2019 NONE SEEN  NONE SEEN Final  . Squamous Epithelial / LPF 12/25/2019 NONE SEEN  0 - 5 Final  . Mucus 12/25/2019 PRESENT   Final   Performed at [facility], [address] [place], [place] [address]    Assessment:  Keeland Talkington is a 74 y.o. male  with mild thrombocytopenia and clinical stage T2N0 hepatocellular carcinoma s/p proton therapy. Platele count has ranged between 94,000 - 126,000 since 06/2011. He denies any new medications or herbal products.  Work-upon 12/07/2016 revealed a hematocrit 45.1, hemoglobin 15.7, MCV 96.6, platelets 114,000, WBC 6200 with an ANC of 3500. Differential was unremarkable. Peripheral smearrevealed platelets of various sizes, some large body platelets. Normal studiesincluded: PT, PTT, SPEP, free light  chains, H pylori antibody, hepatitis B core antibody, hepatitis C antibody, HIV testing, ANA, B12, and folate.   He has hepatocellular carcinoma s/p liver FNA on 04/20/2019.  Pathology revealed moderately differentiated (grade 2) hepatocellular carcinoma.  Clinical stage was T2N0 lesion involving both hepatic segments VIII and V.  He declined surgery.  Child-Pugh class A.  Hepatitis B core antibody, hepatitis B surface antigen, and hepatitis C antibody were non reactive on 12/10/2019.    PET scan on 04/30/2019 revealed a 4.5 cm subtle lesion in the right lobe of the  liver without increased FDG uptake. There was no focal uptake elsewhere to suggest metastatic disease or other malignancy.   He completed a course of proton beam radiation. He received 70 CGE in 25 fractions.  Radiation completed on 06/25/2019.  AFP has been followed:  5.6 on 10/26/2019 and 3.7 on 12/10/2019.  Abdominal MRI on 10/26/2019 at [facility] [place] [facility] [place] revealed decreased size of the segment VIII mass c/w hepatocellular carcinoma which demonstrated viable tumor with thick rim of enhancement (previously 6 x 5.4 x 4.8 cm, now 5 x 4.3 x 4.4 cm). There were no new suspicious hepatic lesions. There was cirrhosis with portal hypertension including splenomegaly (13.5 cm) and small upper abdominal collaterals.  Symptomatically, he has intermittent abdominal pain.  Exam reveals mild tenderness in the RUQ without guarding or rebound tenderness.  Plan: 1.   Review labs from 12/10/2019 and 12/25/2019. 2.   Clinical stage T2N0 hepatocellular carcinoma             Patient was diagnosed incidentally on follow-up of an aortic stent.             Pathology on 04/20/2019 confirmed grade 2 hepatocellular carcinoma.             Patient was offered proton beam therapy versus surgery.  He elected to pursue radiation.                         He completed radiation on 06/25/2019.             Abdominal MRI on 10/26/2019 revealed decrease in size of the segment IVVV mass c/w hepatocellular carcinoma.                         There was a thick rim of enhancement.  There are no new suspicious lesions.            Anticipate follow-up abdominal MRI on 01/28/2020.  Continue surveillance imaging and AFP every 6 months and intermittent chest CT. 3.   Thrombocytopenia             Platelet count is 61,000 - 65,000.               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  Hepatitis B and C serologies were negative on 12/10/2019. 4.   Abdominal pain  Patient  notes intermittent abdominal pain.  RUQ ultrasound on 12/25/2019 revealed cholelithiasis and sludge within the gallbladder with gallbladder wall thickening.  Imaging suggests symptomatic cholelithiasis.  Consider EGD.  Patient is scheduled to see Dr Mariah Milling at [facility]. 5.   Follow-up as scheduled with Dr Windell Moment and Dr Cristino Martes.  MD to contact patient after above evaluation. 6.   RTC as previously scheduled.  I discussed the assessment and treatment plan with the patient.  The patient was provided an opportunity to  ask questions and all were answered.  The patient agreed with the plan and demonstrated an understanding of the instructions.  The patient was advised to call back if the symptoms worsen or if the condition fails to improve as anticipated.   Melissa C. Mike Gip, MD, PhD    12/26/2019, 4:11 PM

## 2019-12-25 NOTE — Telephone Encounter (Signed)
  Please call radiology and confirm that images have been received and downloaded.  After you talk to radiology, please let patient know.  M

## 2019-12-26 ENCOUNTER — Encounter: Payer: Self-pay | Admitting: Hematology and Oncology

## 2019-12-26 ENCOUNTER — Inpatient Hospital Stay (HOSPITAL_BASED_OUTPATIENT_CLINIC_OR_DEPARTMENT_OTHER): Payer: Medicare HMO | Admitting: Hematology and Oncology

## 2019-12-26 VITALS — BP 107/58 | HR 73 | Temp 98.2°F | Wt 209.0 lb

## 2019-12-26 DIAGNOSIS — D696 Thrombocytopenia, unspecified: Secondary | ICD-10-CM | POA: Diagnosis not present

## 2019-12-26 DIAGNOSIS — C220 Liver cell carcinoma: Secondary | ICD-10-CM | POA: Insufficient documentation

## 2019-12-26 NOTE — Progress Notes (Signed)
The patient c/o abdomen pain ( 9)  Which comes and goes at times.

## 2020-01-01 ENCOUNTER — Other Ambulatory Visit: Payer: Self-pay

## 2020-01-01 ENCOUNTER — Emergency Department
Admission: EM | Admit: 2020-01-01 | Discharge: 2020-01-01 | Disposition: A | Discharge status: Home / Self Care | Payer: Medicare HMO | Attending: Emergency Medicine | Admitting: Emergency Medicine

## 2020-01-01 ENCOUNTER — Emergency Department: Payer: Medicare HMO

## 2020-01-01 DIAGNOSIS — Z7984 Long term (current) use of oral hypoglycemic drugs: Secondary | ICD-10-CM | POA: Insufficient documentation

## 2020-01-01 DIAGNOSIS — I2510 Atherosclerotic heart disease of native coronary artery without angina pectoris: Secondary | ICD-10-CM | POA: Diagnosis not present

## 2020-01-01 DIAGNOSIS — Z87891 Personal history of nicotine dependence: Secondary | ICD-10-CM | POA: Insufficient documentation

## 2020-01-01 DIAGNOSIS — R109 Unspecified abdominal pain: Secondary | ICD-10-CM

## 2020-01-01 DIAGNOSIS — Z91040 Latex allergy status: Secondary | ICD-10-CM | POA: Insufficient documentation

## 2020-01-01 DIAGNOSIS — Z79899 Other long term (current) drug therapy: Secondary | ICD-10-CM | POA: Diagnosis not present

## 2020-01-01 DIAGNOSIS — Z7982 Long term (current) use of aspirin: Secondary | ICD-10-CM | POA: Diagnosis not present

## 2020-01-01 DIAGNOSIS — K219 Gastro-esophageal reflux disease without esophagitis: Secondary | ICD-10-CM | POA: Diagnosis not present

## 2020-01-01 DIAGNOSIS — I10 Essential (primary) hypertension: Secondary | ICD-10-CM | POA: Insufficient documentation

## 2020-01-01 DIAGNOSIS — E119 Type 2 diabetes mellitus without complications: Secondary | ICD-10-CM | POA: Insufficient documentation

## 2020-01-01 DIAGNOSIS — Z951 Presence of aortocoronary bypass graft: Secondary | ICD-10-CM | POA: Diagnosis not present

## 2020-01-01 LAB — URINALYSIS, COMPLETE (UACMP) WITH MICROSCOPIC
Bacteria, UA: NONE SEEN
Bilirubin Urine: NEGATIVE
Glucose, UA: 50 mg/dL — AB
Hgb urine dipstick: NEGATIVE
Ketones, ur: NEGATIVE mg/dL
Leukocytes,Ua: NEGATIVE
Nitrite: NEGATIVE
Protein, ur: 30 mg/dL — AB
Specific Gravity, Urine: 1.025 (ref 1.005–1.030)
pH: 5 (ref 5.0–8.0)

## 2020-01-01 LAB — COMPREHENSIVE METABOLIC PANEL
ALT: 42 U/L (ref 0–44)
AST: 36 U/L (ref 15–41)
Albumin: 4.6 g/dL (ref 3.5–5.0)
Alkaline Phosphatase: 73 U/L (ref 38–126)
Anion gap: 10 (ref 5–15)
BUN: 12 mg/dL (ref 8–23)
CO2: 26 mmol/L (ref 22–32)
Calcium: 8.8 mg/dL — ABNORMAL LOW (ref 8.9–10.3)
Chloride: 100 mmol/L (ref 98–111)
Creatinine, Ser: 1 mg/dL (ref 0.61–1.24)
GFR, Estimated: >60 mL/min (ref >60)
Glucose, Bld: 191 mg/dL — ABNORMAL HIGH (ref 70–99)
Potassium: 3.6 mmol/L (ref 3.5–5.1)
Sodium: 136 mmol/L (ref 135–145)
Total Bilirubin: 1.5 mg/dL — ABNORMAL HIGH (ref 0.3–1.2)
Total Protein: 7.8 g/dL (ref 6.5–8.1)

## 2020-01-01 LAB — CBC
HCT: 39.1 % (ref 39.0–52.0)
Hemoglobin: 13.9 g/dL (ref 13.0–17.0)
MCH: 33.6 pg (ref 26.0–34.0)
MCHC: 35.5 g/dL (ref 30.0–36.0)
MCV: 94.4 fL (ref 80.0–100.0)
Platelets: 61 10*3/uL — ABNORMAL LOW (ref 150–400)
RBC: 4.14 MIL/uL — ABNORMAL LOW (ref 4.22–5.81)
RDW: 14.5 % (ref 11.5–15.5)
WBC: 5.7 10*3/uL (ref 4.0–10.5)
nRBC: 0 % (ref 0.0–0.2)

## 2020-01-01 LAB — TROPONIN I (HIGH SENSITIVITY): Troponin I (High Sensitivity): 5 ng/L (ref <18)

## 2020-01-01 LAB — LIPASE, BLOOD: Lipase: 41 U/L (ref 11–51)

## 2020-01-01 IMAGING — CT CT ANGIO CHEST-ABD-PELV FOR DISSECTION W/ AND WO/W CM
2 of 7 series · 12 of 46 positions shown, 14 images · IV contrast (APPLIED)
Comparison: None.

CLINICAL DATA: Chest and abdominal pain.  Aortic dissection.

EXAM:
CT ANGIOGRAPHY CHEST, ABDOMEN AND PELVIS
TECHNIQUE: Non-contrast CT of the chest was initially obtained.

[Series 5: axial arterial · axial · arterial · 0.93mm/px · z∈[-956,-365]mm · 9 of 231 slices shown, 11 images]
[im 17/231  soft-tissue]
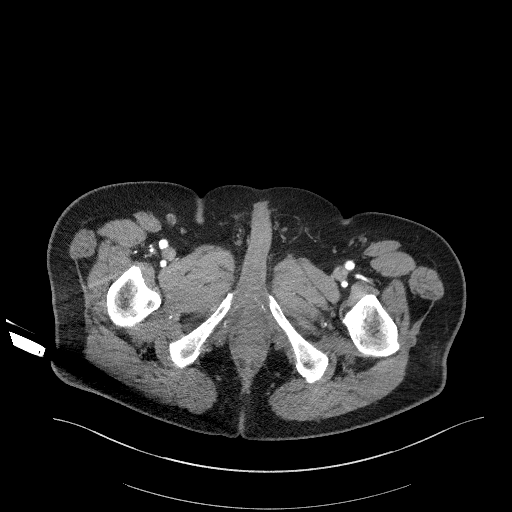
[im 17/231  bone]
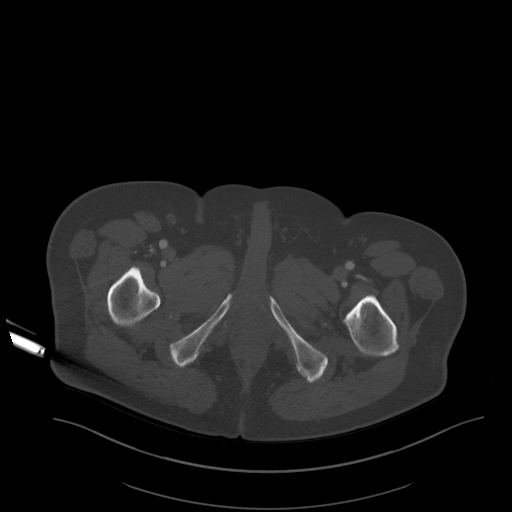
[im 50/231  soft-tissue]
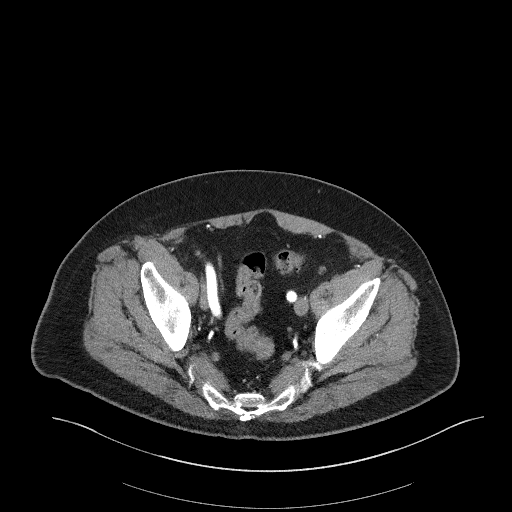
[im 66/231  soft-tissue]
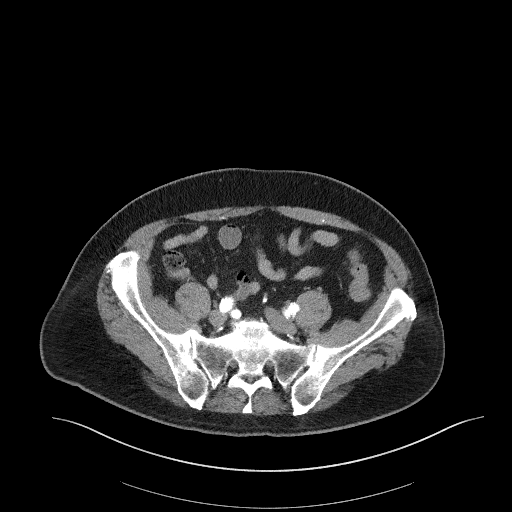
[im 99/231  soft-tissue]
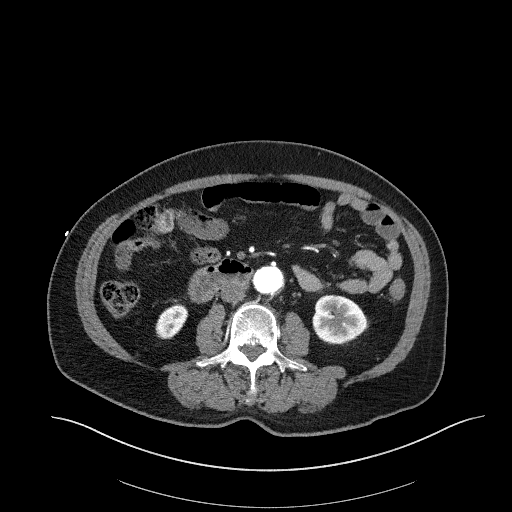
[im 115/231  soft-tissue]
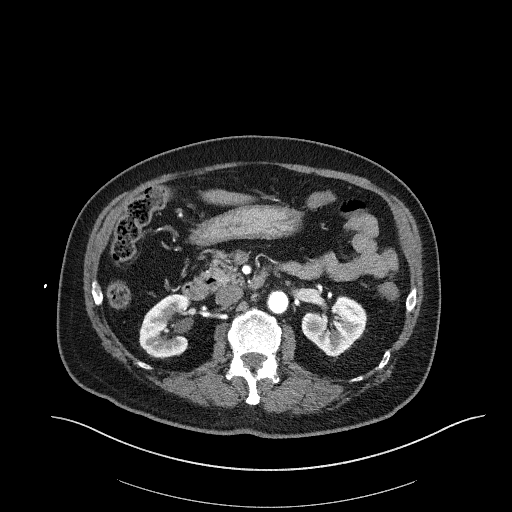
[im 132/231  soft-tissue]
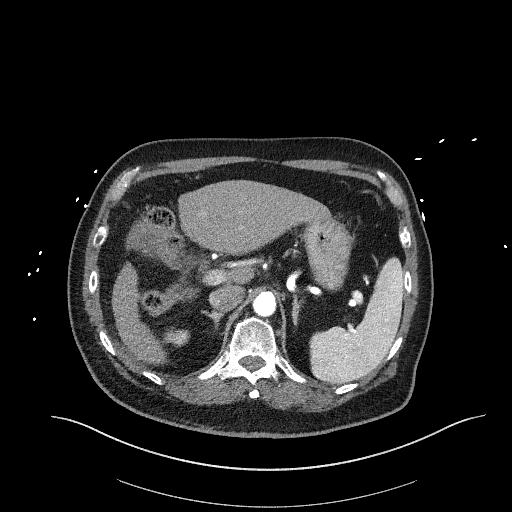
[im 165/231  soft-tissue]
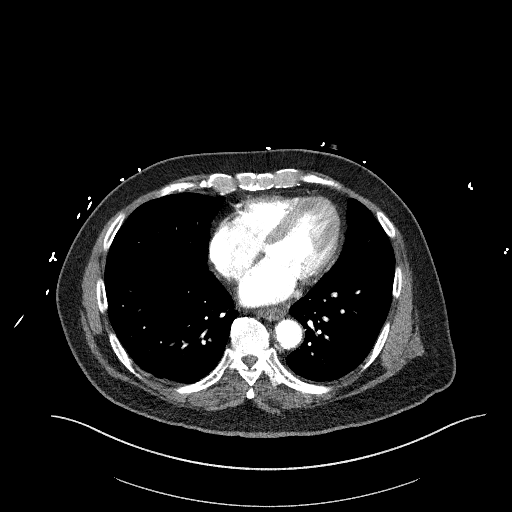
[im 181/231  soft-tissue]
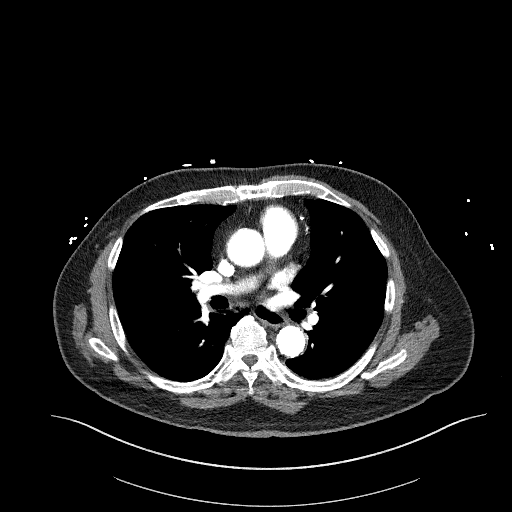
[im 214/231  soft-tissue]
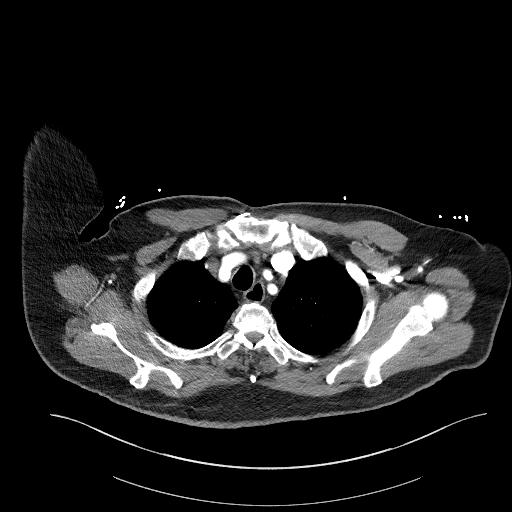
[im 214/231  bone]
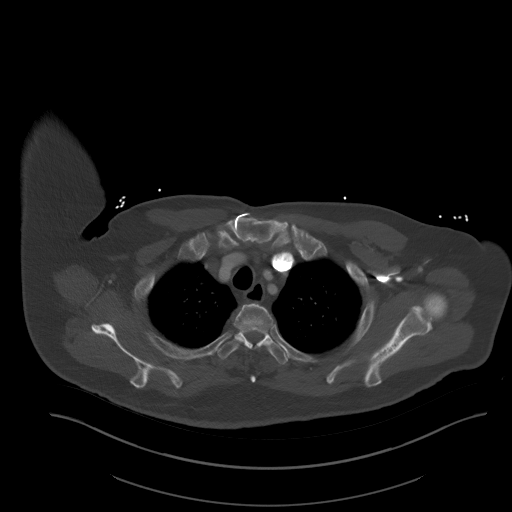

[Series 8: coronals · coronal · 0.82mm/px · 3 of 155 slices shown]
[im 39/155  soft-tissue]
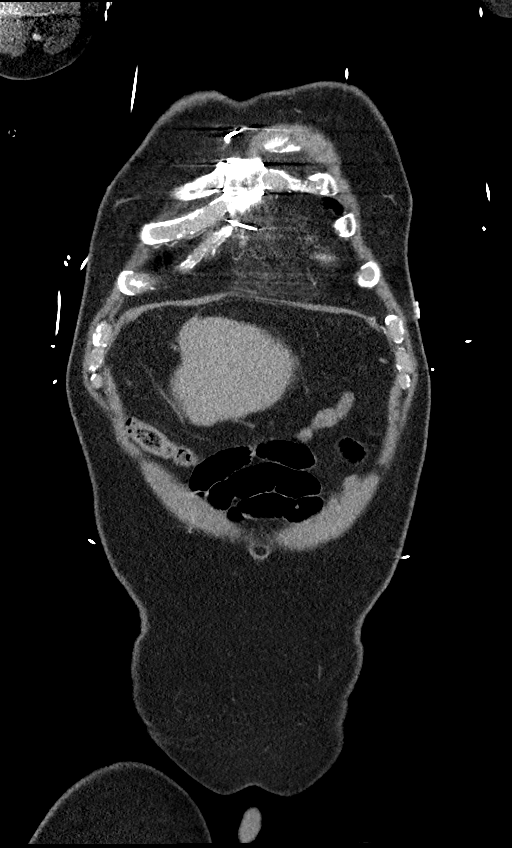
[im 78/155  soft-tissue]
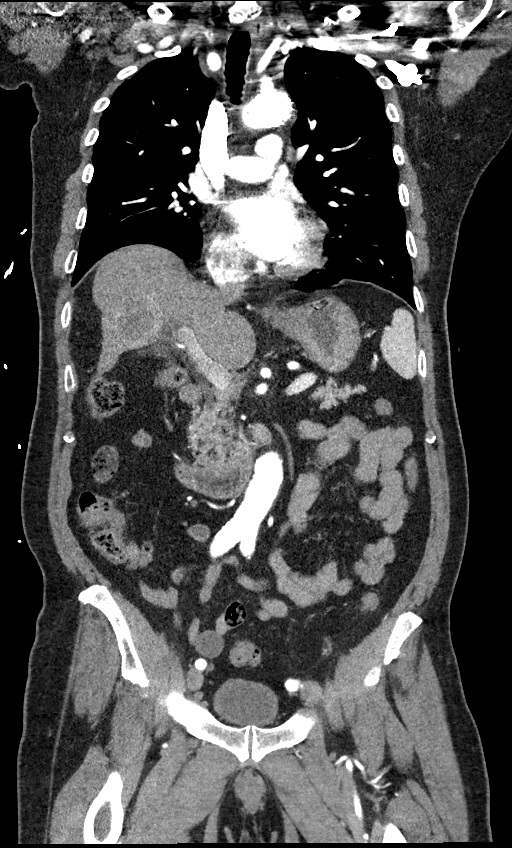
[im 116/155  soft-tissue]
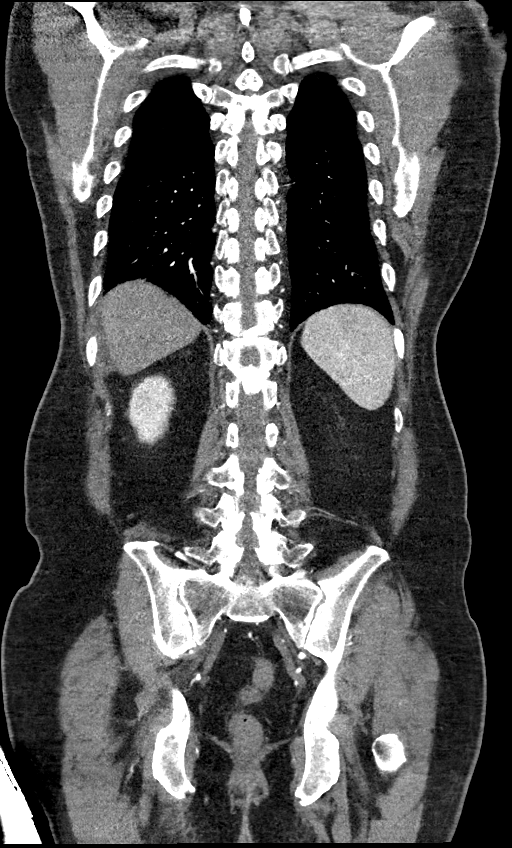

[12 of 46 positions shown; findings below may reference images not displayed]

Multidetector CT imaging through the chest, abdomen and pelvis was
performed using the standard protocol during bolus administration of
intravenous contrast. Multiplanar reconstructed images and MIPs were
obtained and reviewed to evaluate the vascular anatomy.

CONTRAST:  100mL OMNIPAQUE IOHEXOL 350 MG/ML SOLN
FINDINGS: CTA CHEST FINDINGS

Cardiovascular: The thoracic aorta is of normal caliber.
Endovascular stent graft is seen within the a aortic arch and
proximal descending thoracic aorta just beyond the takeoff of the
left subclavian artery. No evidence of aortic aneurysm or
dissection. No intramural hematoma. Mild atherosclerotic
calcification within the descending thoracic aorta.

Coronary artery bypass grafting has been performed. Global cardiac
size within normal limits. No pericardial effusion. Central
pulmonary arteries are of normal caliber.

Mediastinum/Nodes: No enlarged mediastinal, hilar, or axillary lymph
nodes. Thyroid gland, trachea, and esophagus demonstrate no
significant findings.

Lungs/Pleura: Lungs are clear. No pleural effusion or pneumothorax.

Musculoskeletal: No acute bone abnormality. No lytic or blastic bone
lesions.

Review of the MIP images confirms the above findings.

CTA ABDOMEN AND PELVIS FINDINGS

VASCULAR

Aorta: Mild mixed atherosclerotic plaque within the infrarenal
abdominal aorta. Infrarenal fusiform abdominal aortic aneurysm is
present measuring 3.1 x 2.8 cm in greatest dimension. No dissection.

Celiac: Normal anatomic configuration.  Widely patent.

SMA: Widely patent.

Renals: Single renal arteries are widely patent bilaterally and
demonstrate normal contour. No aneurysm.

IMA: Widely patent.

Inflow: Mild atherosclerotic calcification. No aneurysm or
dissection. Internal iliac arteries are patent bilaterally.

Veins: Not well opacified, but morphologically normal.

Review of the MIP images confirms the above findings.

NON-VASCULAR

Hepatobiliary: Rim enhancing mass within segment [DATE] of the liver
measures 4.9 x 5.4 cm and is not fully characterized on this
examination, but appears more fully evaluated on prior MRI
examination of [DATE]. This appears stable in size and
configuration. A a single fiducial marker is seen along its
posterior, inferior margin. The liver contour is mildly lobulated
and there is hypertrophy of the left hepatic and caudate lobe
suggesting changes of underlying cirrhosis. No intra or extrahepatic
biliary ductal dilation. Cholelithiasis noted. Subtle
pericholecystic inflammatory change is identified suggesting changes
of early cholecystitis. A a single calcified stone is seen impacted
at the gallbladder neck.

Pancreas: Unremarkable

Spleen: The spleen is mildly enlarged measuring 13.6 cm in greatest
dimension. No intrasplenic lesions are seen.

Adrenals/Urinary Tract: Adrenal glands are unremarkable. Simple
cortical cyst noted within the upper pole the left kidney. The
kidneys are otherwise unremarkable. Bladder unremarkable.

Stomach/Bowel: Stomach, small bowel, and large bowel are
unremarkable. No free intraperitoneal gas or fluid.

Lymphatic: No pathologic adenopathy within the abdomen and pelvis.

Reproductive: Mild prostatic hypertrophy. Seminal vesicles are
unremarkable.

Other: Tiny fat containing umbilical hernia. Right inguinal hernia
repair has been performed with a recurrence fat containing hernia
identified. Small fat containing left inguinal hernia present.
Rectum unremarkable.

Musculoskeletal: No acute bone abnormality. No lytic or blastic bone
lesions are seen.

Review of the MIP images confirms the above findings.
IMPRESSION: No aortic dissection identified involving the thoracoabdominal
aorta. Status post endovascular stent graft repair of the a proximal
descending thoracic aorta.

3.1 cm fusiform infrarenal abdominal aortic aneurysm.

Cholelithiasis with stone seen impacted within the gallbladder neck.
Subtle pericholecystic inflammatory changes identified. Correlation
with laboratory examination is recommended as this may reflect early
changes of acute cholecystitis. Hepatic biliary scintigraphy or
dedicated right upper quadrant sonography may be helpful for further
evaluation.

5.4 cm stable rim enhancing lesion within segment [DATE] of the liver
suspicious for a primary hepatocellular carcinoma, not fully
characterized on this examination. This was better evaluated on
prior MRI examination of [DATE].

Morphologic changes suggestive of a underlying cirrhosis. Mild
splenomegaly may reflect changes of portal venous hypertension.

Aortic aneurysm NOS ([GO]-[GO]).

## 2020-01-01 IMAGING — US US ABDOMEN LIMITED
1 series · 13 of 25 positions shown · non-contrast
Comparison: CT abdomen and pelvis [DATE]; ultrasound
right upper quadrant [DATE]

CLINICAL DATA: Right quadrant pain

EXAM:
ULTRASOUND ABDOMEN LIMITED RIGHT UPPER QUADRANT

[Series 1: us abdomen limited ruq (liver/gb) · 13 of 63 slices shown]
[im 1/63]
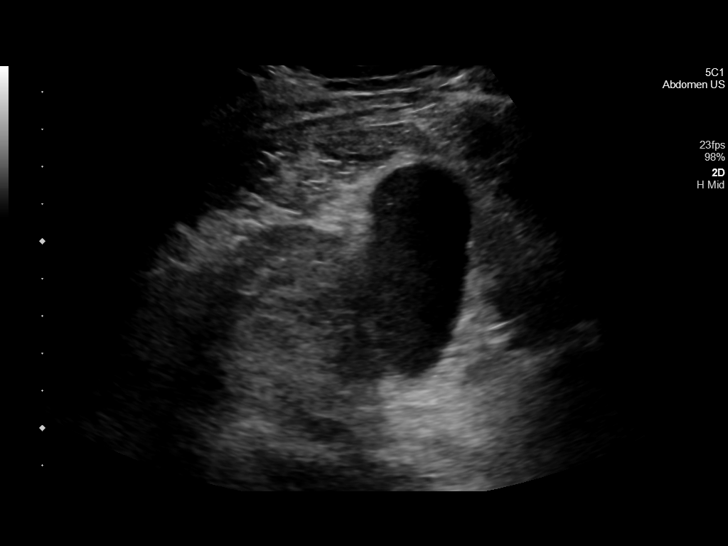
[im 6/63]
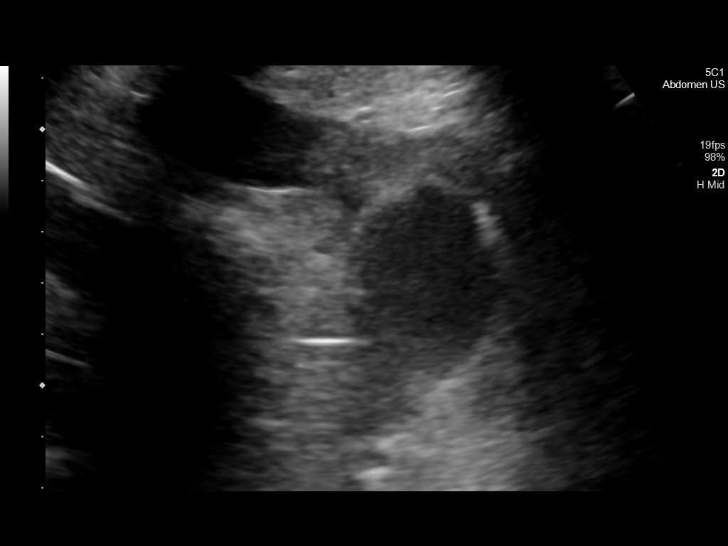
[im 11/63]
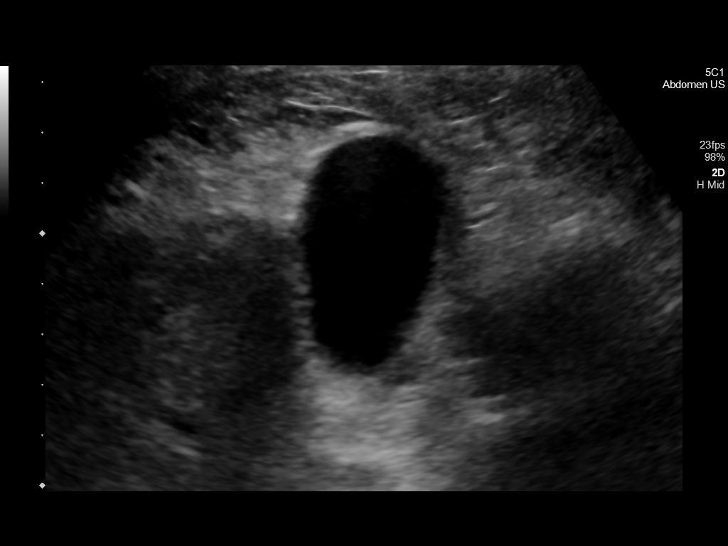
[im 16/63]
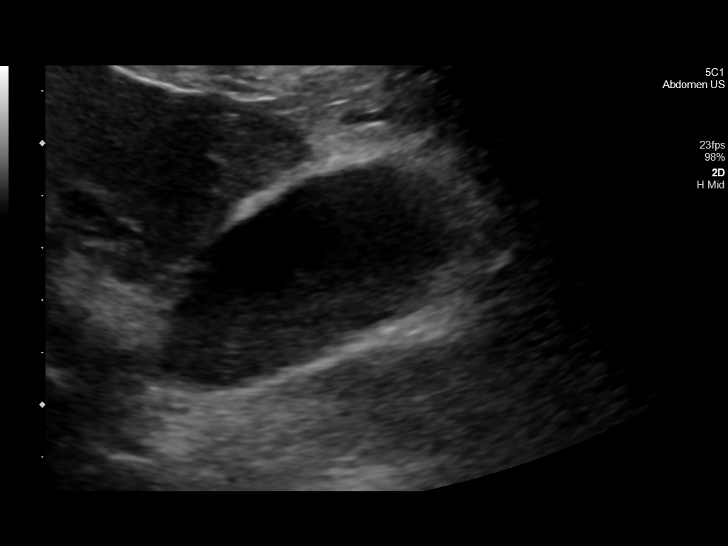
[im 21/63]
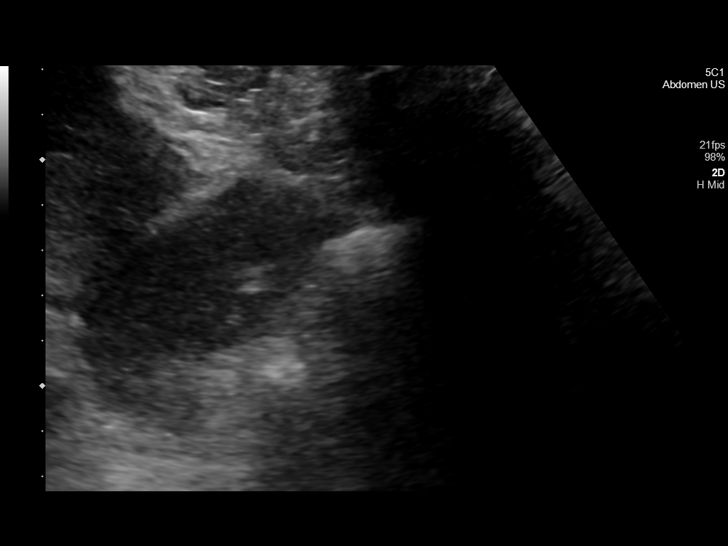
[im 26/63]
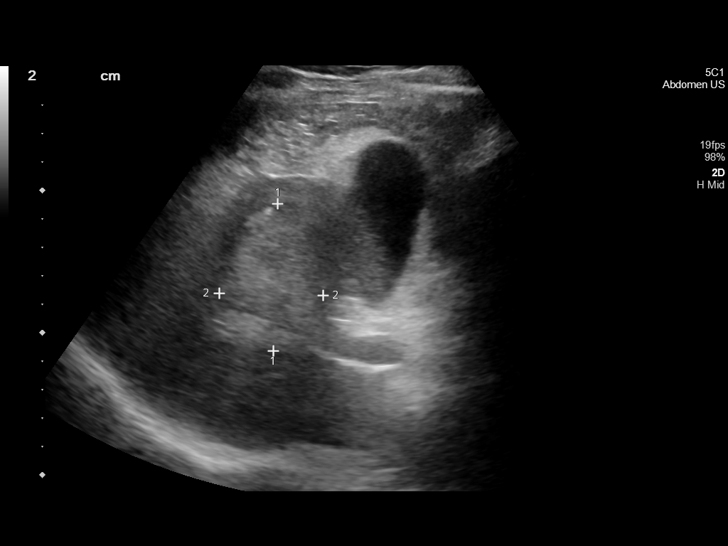
[im 32/63]
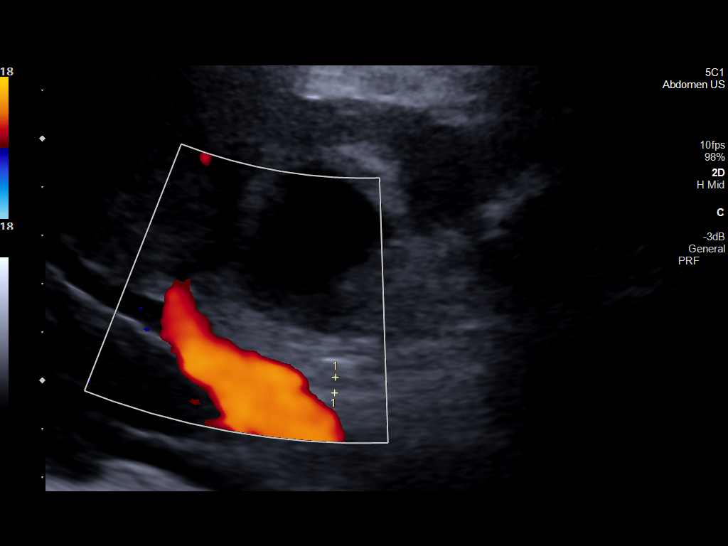
[im 37/63]
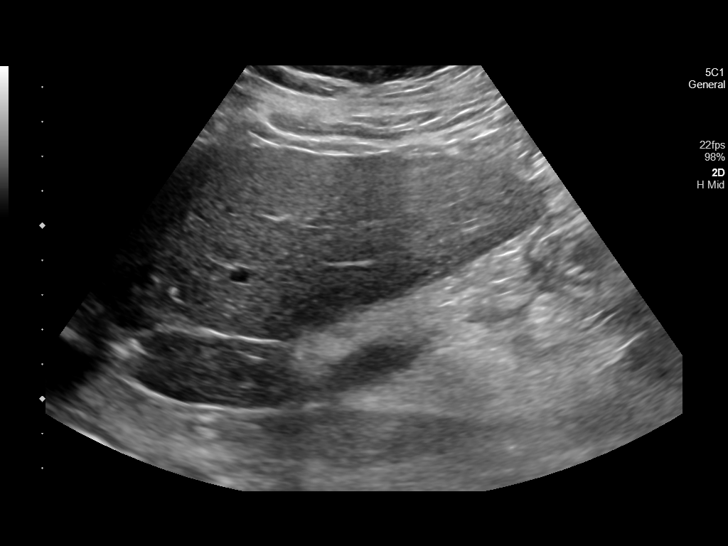
[im 42/63]
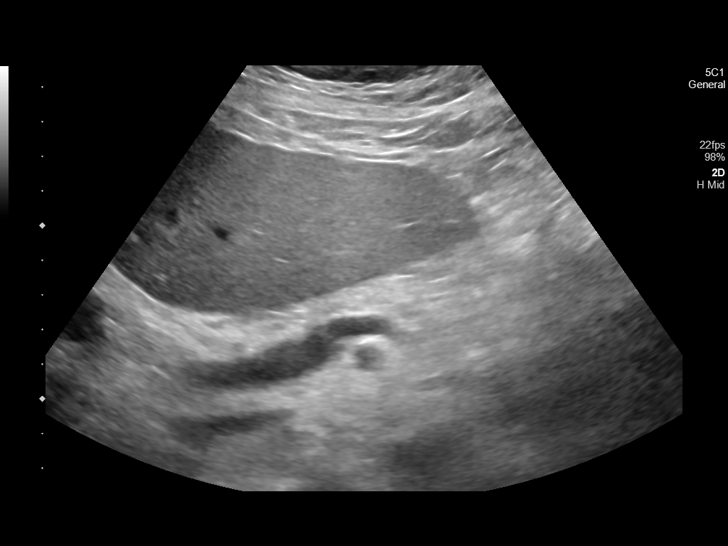
[im 47/63]
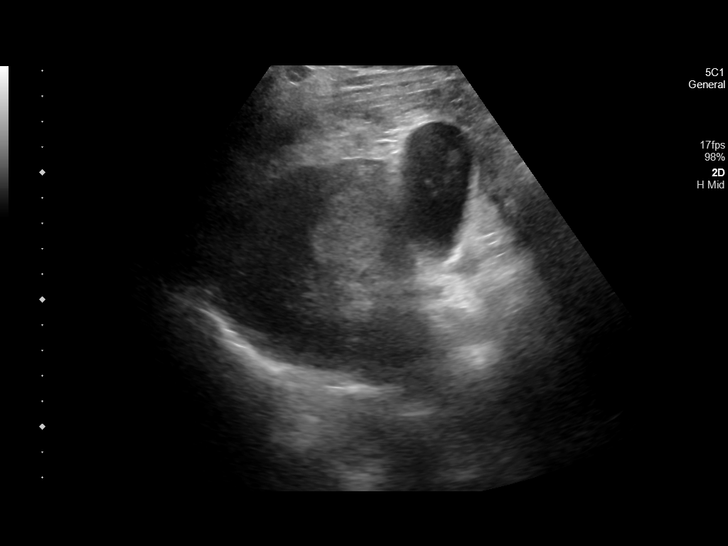
[im 52/63]
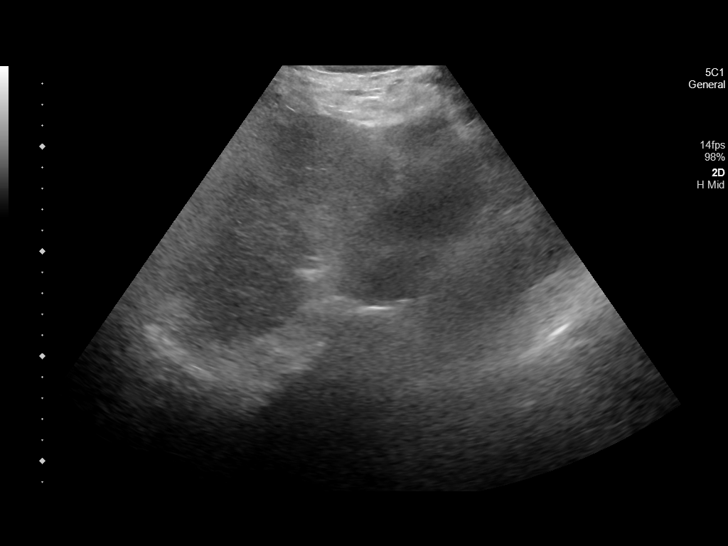
[im 57/63]
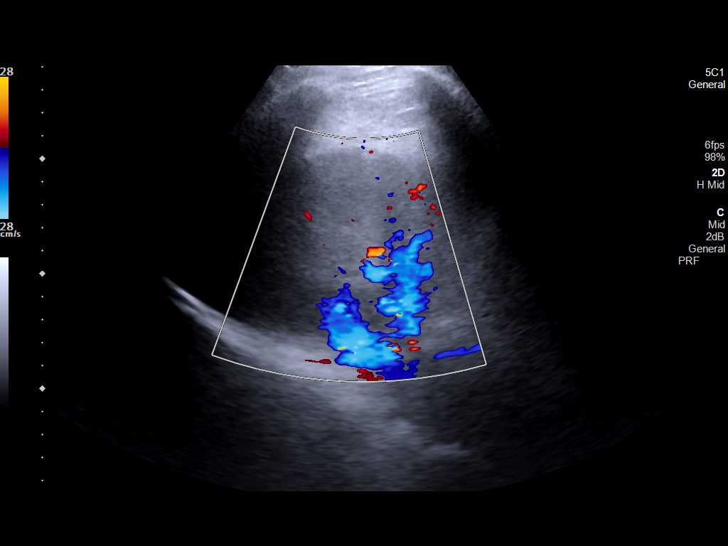
[im 63/63]
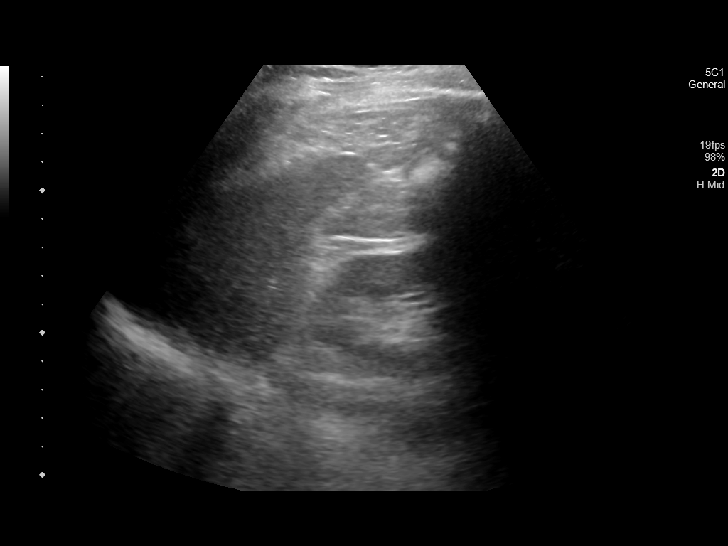

[13 of 25 positions shown; findings below may reference images not displayed]

FINDINGS: Gallbladder:

There is again noted sludge with intermingled gallstones within the
gallbladder. Largest gallstone measures 7 mm in length. There is
gallbladder wall thickening. No appreciable pericholecystic fluid
evident. No sonographic Murphy sign noted by sonographer.

Common bile duct:

Diameter: 3 mm. No intrahepatic or extrahepatic biliary duct
dilatation.

Liver:

Echogenic mass in the right lobe of the liver is again noted
measuring 5.2 x 3.7 x 3.7 cm. Liver contour is subtly nodular. Liver
echogenicity overall is mildly increased. Portal vein is patent on
color Doppler imaging with normal direction of blood flow towards
the liver.

Other: None.
IMPRESSION: 1. Cholelithiasis with intermingled sludge in gallbladder wall
thickening. Appearance concerning for potential degree of
cholecystitis. The apparent calculus in the neck of the gallbladder
seen on CT earlier in the day is not well seen by ultrasound.
Nuclear medicine hepatobiliary imaging study to assess for cystic
duct patency may be reasonable in this circumstance.

2. The appearance of the liver raises concern for a degree of
underlying cirrhosis. The echogenic mass in the right lobe of the
liver is again noted measuring 5.2 x 3.7 x 3.7 cm. Etiology for this
mass uncertain. Liver neoplasm is not excluded by ultrasound.

## 2020-01-01 MED ORDER — ONDANSETRON HCL 4 MG/2ML IJ SOLN
4.0000 mg | Freq: Once | INTRAMUSCULAR | Status: AC
  Administered 2020-01-01: 05:00:00 4 mg via INTRAVENOUS
  Filled 2020-01-01: qty 2

## 2020-01-01 MED ORDER — OXYCODONE-ACETAMINOPHEN 5-325 MG PO TABS
1.0000 | ORAL_TABLET | Freq: Once | ORAL | Status: AC
  Administered 2020-01-01: 13:00:00 1 via ORAL
  Filled 2020-01-01: qty 1

## 2020-01-01 MED ORDER — MORPHINE SULFATE (PF) 4 MG/ML IV SOLN: 4.0000 mg | Freq: Once | INTRAVENOUS | Status: DC

## 2020-01-01 MED ORDER — MORPHINE SULFATE (PF) 4 MG/ML IV SOLN
4.0000 mg | Freq: Once | INTRAVENOUS | Status: AC
Start: 2020-01-01 — End: 2020-01-01
  Administered 2020-01-01: 05:00:00 4 mg via INTRAVENOUS
  Filled 2020-01-01: qty 1

## 2020-01-01 MED ORDER — MORPHINE SULFATE (PF) 4 MG/ML IV SOLN
4.0000 mg | Freq: Once | INTRAVENOUS | Status: AC
  Administered 2020-01-01: 06:00:00 4 mg via INTRAVENOUS
  Filled 2020-01-01: qty 1

## 2020-01-01 MED ORDER — FENTANYL CITRATE (PF) 100 MCG/2ML IJ SOLN
50.0000 ug | Freq: Once | INTRAMUSCULAR | Status: AC
Start: 2020-01-01 — End: 2020-01-01
  Administered 2020-01-01: 08:00:00 50 ug via INTRAVENOUS
  Filled 2020-01-01: qty 2

## 2020-01-01 MED ORDER — OXYCODONE-ACETAMINOPHEN 5-325 MG PO TABS: 1.0000 | ORAL_TABLET | ORAL | 0 refills | Status: DC | PRN

## 2020-01-01 MED ORDER — IOHEXOL 350 MG/ML SOLN
100.0000 mL | Freq: Once | INTRAVENOUS | Status: AC | PRN
  Administered 2020-01-01: 100 mL via INTRAVENOUS

## 2020-01-01 NOTE — ED Notes (Signed)
Patient passed PO Challenge without difficulties. Cyril Loosen, MD aware.

## 2020-01-01 NOTE — ED Provider Notes (Signed)
[facility] Emergency Department Provider Note  ____________________________________________  Time seen: Approximately 4:57 AM  I have reviewed the triage vital signs and the nursing notes.   HISTORY  Chief Complaint Abdominal Pain   HPI Timothy Murray is a 74 y.o. male with a history of CAD status post CABG, ascending aortic dissection status post repair, hypertension, hyperlipidemia, diabetes, hepatocellular carcinoma status post radiation therapy  who presents for evaluation of abdominal pain.  Patient reports that the pain starts in the left upper quadrant and radiates throughout the entire abdomen.  The pain is currently 10 out of 10, dull, and constant.  He took Tylenol x2 with no significant changes.  Had the same pain a week ago for which he was seen in the emergency room but left prior to be evaluated by physician. Has been seen by surgery in the beginning of the month for the same pain and [place] concerning for cholelithiasis. Pain is not worse postprandially.  No chest pain or shortness of breath.  He says the pain does radiate to his back.  No fever or chills, no cough or congestion, no diarrhea or constipation, no nausea or vomiting, no melena.  No NSAID use, no alcohol use.  Past Medical History:  Diagnosis Date  . Arthritis    "lower back" (10/24/2012)  . Coronary artery disease   . GERD (gastroesophageal reflux disease)   . High cholesterol   . Hypertension   . Type II diabetes mellitus [facility])     Patient Active Problem List   Diagnosis Date Noted  . Hepatocellular carcinoma ([place]) 12/26/2019  . Thrombocytopenia ([place]) 12/16/2016  . HTN (hypertension) 10/29/2012  . Diabetes ([place]) 10/29/2012  . Other and unspecified hyperlipidemia 10/29/2012  . CAD (coronary artery disease), native coronary artery 10/29/2012  . S/P CABG x 3 10/29/2012    Past Surgical History:  Procedure Laterality Date  . APPENDECTOMY  2002  . CARDIAC CATHETERIZATION   10/24/2012  . CORONARY ARTERY BYPASS GRAFT N/A 10/26/2012   Procedure: CORONARY ARTERY BYPASS GRAFTING (CABG);  Surgeon: Melrose Nakayama, MD;  Location: [place];  Service: Open Heart Surgery;  Laterality: N/A;  Times 3 using left internal mammary artery and endoscopically harvested right saphenous vein    Prior to Admission medications   Medication Sig Start Date End Date Taking? Authorizing Provider  allopurinol (ZYLOPRIM) 100 MG tablet Take 100 mg by mouth daily. Patient not taking: No sig reported 11/26/16   [provider]  aspirin EC 81 MG tablet Take 81 mg by mouth daily. Patient not taking: No sig reported    [provider]  atorvastatin (LIPITOR) 20 MG tablet Take 1 tablet (20 mg total) by mouth daily at 6 PM. Patient taking differently: Take 40 mg by mouth daily at 6 PM. 10/30/12   Nani Skillern, PA-C  fluocinonide cream (LIDEX) 0.05 %  11/17/12   [provider]  furosemide (LASIX) 40 MG tablet Take 40 mg by mouth daily. Patient not taking: No sig reported    [provider]  glimepiride (AMARYL) 1 MG tablet Take 1 mg by mouth daily.    [provider]  glucosamine-chondroitin 500-400 MG tablet Take 1 tablet by mouth daily.    [provider]  ibuprofen (ADVIL,MOTRIN) 600 MG tablet Take 1 tablet (600 mg total) by mouth every 6 (six) hours as needed. Patient not taking: No sig reported 06/09/15   Beers, Pierce Crane, PA-C  losartan (COZAAR) 25 MG tablet Take 25  mg by mouth daily.    [provider]  metoprolol tartrate (LOPRESSOR) 25 MG tablet Take 25 mg by mouth daily.    [provider]  Multiple Vitamins-Minerals (MEGA MULTIVITAMIN FOR MEN) TABS Take 1 tablet by mouth daily. Patient not taking: No sig reported    [provider]  niacin 500 MG CR capsule Take 1 capsule (500 mg total) by mouth at bedtime. Patient not taking: No sig reported 10/30/12   Nani Skillern, PA-C  omeprazole  (PRILOSEC) 20 MG capsule Take 40 mg by mouth every morning.    [provider]  potassium chloride SA (K-DUR,KLOR-CON) 20 MEQ tablet Take 20 mEq by mouth daily. Patient not taking: Reported on 12/26/2019    [provider]  saxagliptin HCl (ONGLYZA) 2.5 MG TABS tablet Take 2.5 mg by mouth daily. Patient not taking: No sig reported    [provider]  zolpidem (AMBIEN) 10 MG tablet Take 10 mg by mouth at bedtime as needed for sleep. Patient not taking: No sig reported    [provider]    Allergies Other, Soap & cleansers, and Latex  Family History  Problem Relation Age of Onset  . Cancer Sister     Social History Social History   Tobacco Use  . Smoking status: Former Smoker    Packs/day: 0.50    Years: 30.00    Pack years: 15.00    Types: Cigarettes    Quit date: 08/28/2001    Years since quitting: 18.3  . Smokeless tobacco: Never Used  Substance Use Topics  . Alcohol use: No    Alcohol/week: 0.0 standard drinks    Comment: 10/24/2012 "stopped drinking alcohol in 2003; used to drink ~ 9 bottles beer/wk"  . Drug use: No    Review of Systems  Constitutional: Negative for fever. Eyes: Negative for visual changes. ENT: Negative for sore throat. Neck: No neck pain  Cardiovascular: Negative for chest pain. Respiratory: Negative for shortness of breath. Gastrointestinal: + abdominal pain. No vomiting or diarrhea. Genitourinary: Negative for dysuria. Musculoskeletal: Negative for back pain. Skin: Negative for rash. Neurological: Negative for headaches, weakness or numbness. Psych: No SI or HI  ____________________________________________   PHYSICAL EXAM:  VITAL SIGNS: ED Triage Vitals  Enc Vitals Group     BP 01/01/20 0250 (!) 168/73     Pulse Rate 01/01/20 0248 72     Resp 01/01/20 0248 20     Temp 01/01/20 0248 98 F (36.7 C)     Temp Source 01/01/20 0248 Oral     SpO2 01/01/20 0248 98 %     Weight 01/01/20 0249 207 lb  (93.9 kg)     Height 01/01/20 0249 6' (1.829 m)     Head Circumference --      Peak Flow --      Pain Score 01/01/20 0249 10     Pain Loc --      Pain Edu? --      Excl. in [place]? --     Constitutional: Alert and oriented. Well appearing and in no apparent distress. HEENT:      Head: Normocephalic and atraumatic.         Eyes: Conjunctivae are normal. Sclera is non-icteric.       Mouth/Throat: Mucous membranes are moist.       Neck: Supple with no signs of meningismus. Cardiovascular: Regular rate and rhythm. No murmurs, gallops, or rubs. 2+ symmetrical distal pulses are present in all extremities. No  JVD. Respiratory: Normal respiratory effort. Lungs are clear to auscultation bilaterally. No wheezes, crackles, or rhonchi.  Gastrointestinal: Soft, tender to palpation the left upper quadrant and periumbilical regions, and non distended with positive bowel sounds. No rebound or guarding. Genitourinary: No CVA tenderness. Musculoskeletal:  No edema, cyanosis, or erythema of extremities. Neurologic: Normal speech and language. Face is symmetric. Moving all extremities. No gross focal neurologic deficits are appreciated. Skin: Skin is warm, dry and intact. No rash noted. Psychiatric: Mood and affect are normal. Speech and behavior are normal.  ____________________________________________   LABS (all labs ordered are listed, but only abnormal results are displayed)  Labs Reviewed  CBC - Abnormal; Notable for the following components:      Result Value   RBC 4.14 (*)    Platelets 61 (*)    All other components within normal limits  COMPREHENSIVE METABOLIC PANEL - Abnormal; Notable for the following components:   Glucose, Bld 191 (*)    Calcium 8.8 (*)    Total Bilirubin 1.5 (*)    All other components within normal limits  URINALYSIS, COMPLETE (UACMP) WITH MICROSCOPIC - Abnormal; Notable for the following components:   Color, Urine YELLOW (*)    APPearance CLEAR (*)    Glucose, UA  50 (*)    Protein, ur 30 (*)    All other components within normal limits  LIPASE, BLOOD  TROPONIN I (HIGH SENSITIVITY)   ____________________________________________  EKG  ED ECG REPORT I, Rudene Re, the attending physician, personally viewed and interpreted this ECG.  Normal sinus rhythm, rate of 66, normal intervals, normal axis, no ST elevations or depressions. ____________________________________________  RADIOLOGY  I have personally reviewed the images performed during this visit and I agree with the Radiologist's read.   Interpretation by Radiologist:  CT Angio Chest/Abd/Pel for Dissection W and/or Wo Contrast  Result Date: 01/01/2020 CLINICAL DATA:  Chest and abdominal pain.  Aortic dissection. EXAM: CT ANGIOGRAPHY CHEST, ABDOMEN AND PELVIS TECHNIQUE: Non-contrast CT of the chest was initially obtained. Multidetector CT imaging through the chest, abdomen and pelvis was performed using the standard protocol during bolus administration of intravenous contrast. Multiplanar reconstructed images and MIPs were obtained and reviewed to evaluate the vascular anatomy. CONTRAST:  157mL OMNIPAQUE IOHEXOL 350 MG/ML SOLN COMPARISON:  None. FINDINGS: CTA CHEST FINDINGS Cardiovascular: The thoracic aorta is of normal caliber. Endovascular stent graft is seen within the a aortic arch and proximal descending thoracic aorta just beyond the takeoff of the left subclavian artery. No evidence of aortic aneurysm or dissection. No intramural hematoma. Mild atherosclerotic calcification within the descending thoracic aorta. Coronary artery bypass grafting has been performed. Global cardiac size within normal limits. No pericardial effusion. Central pulmonary arteries are of normal caliber. Mediastinum/Nodes: No enlarged mediastinal, hilar, or axillary lymph nodes. Thyroid gland, trachea, and esophagus demonstrate no significant findings. Lungs/Pleura: Lungs are clear. No pleural effusion or  pneumothorax. Musculoskeletal: No acute bone abnormality. No lytic or blastic bone lesions. Review of the MIP images confirms the above findings. CTA ABDOMEN AND PELVIS FINDINGS VASCULAR Aorta: Mild mixed atherosclerotic plaque within the infrarenal abdominal aorta. Infrarenal fusiform abdominal aortic aneurysm is present measuring 3.1 x 2.8 cm in greatest dimension. No dissection. Celiac: Normal anatomic configuration.  Widely patent. SMA: Widely patent. Renals: Single renal arteries are widely patent bilaterally and demonstrate normal contour. No aneurysm. IMA: Widely patent. Inflow: Mild atherosclerotic calcification. No aneurysm or dissection. Internal iliac arteries are patent bilaterally. Veins: Not well opacified, but morphologically normal. Review of  the MIP images confirms the above findings. NON-VASCULAR Hepatobiliary: Rim enhancing mass within segment 5/8 of the liver measures 4.9 x 5.4 cm and is not fully characterized on this examination, but appears more fully evaluated on prior MRI examination of 10/26/2019. This appears stable in size and configuration. A a single fiducial marker is seen along its posterior, inferior margin. The liver contour is mildly lobulated and there is hypertrophy of the left hepatic and caudate lobe suggesting changes of underlying cirrhosis. No intra or extrahepatic biliary ductal dilation. Cholelithiasis noted. Subtle pericholecystic inflammatory change is identified suggesting changes of early cholecystitis. A a single calcified stone is seen impacted at the gallbladder neck. Pancreas: Unremarkable Spleen: The spleen is mildly enlarged measuring 13.6 cm in greatest dimension. No intrasplenic lesions are seen. Adrenals/Urinary Tract: Adrenal glands are unremarkable. Simple cortical cyst noted within the upper pole the left kidney. The kidneys are otherwise unremarkable. Bladder unremarkable. Stomach/Bowel: Stomach, small bowel, and large bowel are unremarkable. No free  intraperitoneal gas or fluid. Lymphatic: No pathologic adenopathy within the abdomen and pelvis. Reproductive: Mild prostatic hypertrophy. Seminal vesicles are unremarkable. Other: Tiny fat containing umbilical hernia. Right inguinal hernia repair has been performed with a recurrence fat containing hernia identified. Small fat containing left inguinal hernia present. Rectum unremarkable. Musculoskeletal: No acute bone abnormality. No lytic or blastic bone lesions are seen. Review of the MIP images confirms the above findings. IMPRESSION: No aortic dissection identified involving the thoracoabdominal aorta. Status post endovascular stent graft repair of the a proximal descending thoracic aorta. 3.1 cm fusiform infrarenal abdominal aortic aneurysm. Cholelithiasis with stone seen impacted within the gallbladder neck. Subtle pericholecystic inflammatory changes identified. Correlation with laboratory examination is recommended as this may reflect early changes of acute cholecystitis. Hepatic biliary scintigraphy or dedicated right upper quadrant sonography may be helpful for further evaluation. 5.4 cm stable rim enhancing lesion within segment 5/8 of the liver suspicious for a primary hepatocellular carcinoma, not fully characterized on this examination. This was better evaluated on prior MRI examination of 10/26/2019. Morphologic changes suggestive of a underlying cirrhosis. Mild splenomegaly may reflect changes of portal venous hypertension. Aortic aneurysm NOS (ICD10-I71.9). Electronically Signed   By: Fidela Salisbury MD   On: 01/01/2020 07:24      ____________________________________________   PROCEDURES  Procedure(s) performed:yes  .1-3 Lead EKG Interpretation Performed by: Rudene Re, MD Authorized by: Rudene Re, MD     Interpretation: non-specific     ECG rate assessment: normal     Rhythm: sinus rhythm     Ectopy: none     Critical Care performed:   None ____________________________________________   INITIAL IMPRESSION / ASSESSMENT AND PLAN / ED COURSE   74 y.o. male with a history of CAD status post CABG, ascending aortic dissection status post repair, hypertension, hyperlipidemia, diabetes, hepatocellular carcinoma status post radiation therapy  who presents for evaluation of abdominal pain.  Patient looks uncomfortable but is in no obvious distress, BP slightly elevated 168/73, remainder of his vital signs are within normal limits.  Abdomen is soft with no rebound or guarding, positive bowel sounds, tender to palpation most in the periumbilical and left upper quadrant regions.  Patient status post appendectomy.  Ddx PUD, metastatic cancer, intra-abdominal bleed, aneurysm, complications associated with dissection repair, diverticulitis, SBP, SBO.  Labs showing no leukocytosis, stable thrombocytopenia, no anemia, CMP with no acute abnormalities, lipase is normal.  UA with no blood or signs of infection.  We'll get a CTA chest abdomen pelvis and treat the  pain with IV morphine and Zofran.  [place]  Patient placed on telemetry for close monitoring.  _________________________ 7:30 AM on 01/01/2020 -----------------------------------------  CT visualized by me with no signs of dissection or intra-abdominal ascites or bleeding.  Read confirmed by radiology who also raises suspicion for possible cholecystitis with a stone stuck in the neck of the gallbladder.  Right upper quadrant ultrasound has been ordered.  Patient has received 3 rounds of narcotic pain medication continues to have persistent pain.  Plan to admit to surgery versus medicine depending on the results of the right upper quadrant ultrasound.  Care transfer to Dr. Cyril Loosen.     _____________________________________________ Please note:  Patient was evaluated in Emergency Department today for the symptoms  described in the history of present illness. Patient was evaluated in the context of the global COVID-19 pandemic, which necessitated consideration that the patient might be at risk for infection with the SARS-CoV-2 virus that causes COVID-19. Institutional protocols and algorithms that pertain to the evaluation of patients at risk for COVID-19 are in a state of rapid change based on information released by regulatory bodies including the CDC and federal and state organizations. These policies and algorithms were followed during the patient's care in the ED.  Some ED evaluations and interventions may be delayed as a result of limited staffing during the pandemic.   [place] Controlled Substance Database was reviewed by me. ____________________________________________   FINAL CLINICAL IMPRESSION(S) / ED DIAGNOSES   Final diagnoses:  Abdominal pain      NEW MEDICATIONS STARTED DURING THIS VISIT:  ED Discharge Orders    None       Note:  This document was prepared using Dragon voice recognition software and may include unintentional dictation errors.    Don Perking, [place], MD 01/01/20 [phone]

## 2020-01-01 NOTE — ED Notes (Signed)
EKG completed and given to provider for review. Pt laying on bed in room. Pt on cardiac, and pulse ox monitor.

## 2020-01-01 NOTE — ED Notes (Signed)
MD at bedside. 

## 2020-01-01 NOTE — ED Notes (Signed)
Patient given crackers and water for PO challenge

## 2020-01-01 NOTE — ED Notes (Signed)
US at bedside

## 2020-01-01 NOTE — ED Provider Notes (Signed)
I have consulted Dr. Tonna Boehringer of general surgery, he will see the patient   Timothy Every, MD 01/01/20 [phone]

## 2020-01-01 NOTE — ED Triage Notes (Signed)
Pt in with co generalized abd pain, has been here for the same in the past but left prior to being seen. States pain returned tonight, no n.v.d.

## 2020-01-08 DIAGNOSIS — C220 Liver cell carcinoma: Secondary | ICD-10-CM | POA: Diagnosis not present

## 2020-01-08 DIAGNOSIS — D696 Thrombocytopenia, unspecified: Secondary | ICD-10-CM | POA: Diagnosis not present

## 2020-01-10 DIAGNOSIS — R072 Precordial pain: Secondary | ICD-10-CM | POA: Diagnosis not present

## 2020-01-10 DIAGNOSIS — R0602 Shortness of breath: Secondary | ICD-10-CM | POA: Diagnosis not present

## 2020-01-11 ENCOUNTER — Telehealth: Payer: Self-pay | Admitting: Hematology and Oncology

## 2020-01-11 DIAGNOSIS — I712 Thoracic aortic aneurysm, without rupture: Secondary | ICD-10-CM | POA: Diagnosis not present

## 2020-01-11 DIAGNOSIS — I10 Essential (primary) hypertension: Secondary | ICD-10-CM | POA: Diagnosis not present

## 2020-01-11 DIAGNOSIS — R0602 Shortness of breath: Secondary | ICD-10-CM | POA: Diagnosis not present

## 2020-01-11 DIAGNOSIS — E785 Hyperlipidemia, unspecified: Secondary | ICD-10-CM | POA: Diagnosis not present

## 2020-01-11 DIAGNOSIS — I349 Nonrheumatic mitral valve disorder, unspecified: Secondary | ICD-10-CM | POA: Diagnosis not present

## 2020-01-11 DIAGNOSIS — I2510 Atherosclerotic heart disease of native coronary artery without angina pectoris: Secondary | ICD-10-CM | POA: Diagnosis not present

## 2020-01-11 DIAGNOSIS — I25810 Atherosclerosis of coronary artery bypass graft(s) without angina pectoris: Secondary | ICD-10-CM | POA: Diagnosis not present

## 2020-01-11 DIAGNOSIS — R002 Palpitations: Secondary | ICD-10-CM | POA: Diagnosis not present

## 2020-01-11 NOTE — Telephone Encounter (Signed)
Spoke to [employer]) in reference to records being sent during gap of his care. Acknowledged that there was a gap but pt. Had not been seen until 12/25/2019 by Dr. Mike Gip.

## 2020-01-14 DIAGNOSIS — R0602 Shortness of breath: Secondary | ICD-10-CM | POA: Diagnosis not present

## 2020-01-14 DIAGNOSIS — I10 Essential (primary) hypertension: Secondary | ICD-10-CM | POA: Diagnosis not present

## 2020-01-14 DIAGNOSIS — I2510 Atherosclerotic heart disease of native coronary artery without angina pectoris: Secondary | ICD-10-CM | POA: Diagnosis not present

## 2020-01-14 DIAGNOSIS — I712 Thoracic aortic aneurysm, without rupture: Secondary | ICD-10-CM | POA: Diagnosis not present

## 2020-01-14 DIAGNOSIS — I25810 Atherosclerosis of coronary artery bypass graft(s) without angina pectoris: Secondary | ICD-10-CM | POA: Diagnosis not present

## 2020-01-14 DIAGNOSIS — E785 Hyperlipidemia, unspecified: Secondary | ICD-10-CM | POA: Diagnosis not present

## 2020-01-14 DIAGNOSIS — I349 Nonrheumatic mitral valve disorder, unspecified: Secondary | ICD-10-CM | POA: Diagnosis not present

## 2020-01-14 DIAGNOSIS — R002 Palpitations: Secondary | ICD-10-CM | POA: Diagnosis not present

## 2020-01-16 DIAGNOSIS — R634 Abnormal weight loss: Secondary | ICD-10-CM | POA: Diagnosis not present

## 2020-01-16 DIAGNOSIS — E138 Other specified diabetes mellitus with unspecified complications: Secondary | ICD-10-CM | POA: Diagnosis not present

## 2020-01-16 DIAGNOSIS — R109 Unspecified abdominal pain: Secondary | ICD-10-CM | POA: Diagnosis not present

## 2020-01-28 ENCOUNTER — Ambulatory Visit: Payer: Medicare HMO

## 2020-01-30 ENCOUNTER — Ambulatory Visit: Payer: Medicare HMO | Admitting: Hematology and Oncology

## 2020-01-31 ENCOUNTER — Ambulatory Visit
Admission: RE | Admit: 2020-01-31 | Discharge: 2020-01-31 | Disposition: A | Discharge status: Home / Self Care | Payer: Medicare HMO | Source: Ambulatory Visit | Attending: Hematology and Oncology | Admitting: Hematology and Oncology

## 2020-01-31 ENCOUNTER — Encounter: Payer: Self-pay | Admitting: Gastroenterology

## 2020-01-31 ENCOUNTER — Other Ambulatory Visit: Payer: Self-pay

## 2020-01-31 ENCOUNTER — Ambulatory Visit: Payer: Medicare HMO | Admitting: Gastroenterology

## 2020-01-31 VITALS — BP 88/45 | HR 64 | Ht 72.0 in | Wt 204.6 lb

## 2020-01-31 DIAGNOSIS — C220 Liver cell carcinoma: Secondary | ICD-10-CM | POA: Diagnosis not present

## 2020-01-31 DIAGNOSIS — K219 Gastro-esophageal reflux disease without esophagitis: Secondary | ICD-10-CM

## 2020-01-31 DIAGNOSIS — R109 Unspecified abdominal pain: Secondary | ICD-10-CM | POA: Diagnosis not present

## 2020-01-31 DIAGNOSIS — K2100 Gastro-esophageal reflux disease with esophagitis, without bleeding: Secondary | ICD-10-CM

## 2020-01-31 IMAGING — MR MR ABDOMEN WO/W CM
17 series · 48 of 48 positions shown · IV contrast (gadavist)
Comparison: [DATE] from [facility]]

CLINICAL DATA: Hepatobiliary carcinoma. Follow-up treatment
response to proton therapy.

EXAM:
MRI ABDOMEN WITHOUT AND WITH CONTRAST
TECHNIQUE: Multiplanar multisequence MR imaging of the abdomen was performed
both before and after the administration of intravenous contrast.
CONTRAST:  9mL GADAVIST GADOBUTROL 1 MMOL/ML IV SOLN

[Series 3: T2 · coronal · 6.0mm · 1.19mm/px · 2 of 30 slices shown (1 of 2)]
[im 1/30]
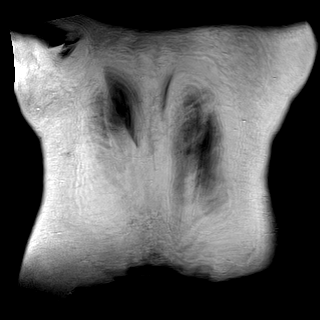
[im 30/30]
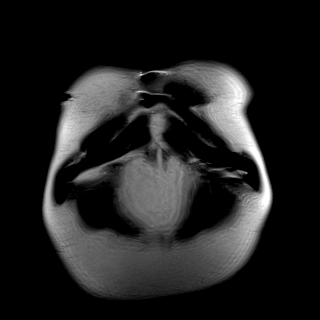

[Series 4: T2 · axial · 6.0mm · 1.19mm/px · z∈[-162,+61]mm · 2 of 32 slices shown (2 of 2)]
[im 1/32]
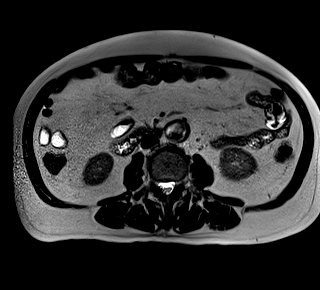
[im 32/32]
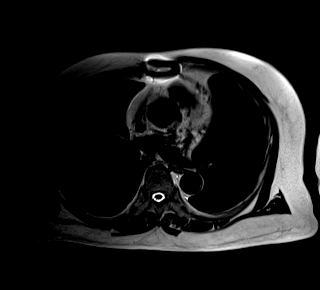

[Series 6: T2 fat-sat · axial · 6.0mm · 1.19mm/px · z∈[-141,+96]mm · 2 of 34 slices shown]
[im 1/34]
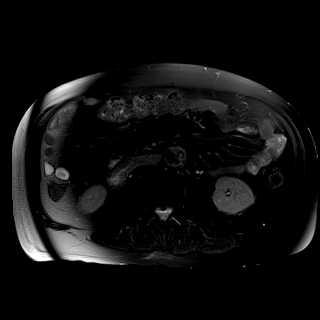
[im 34/34]
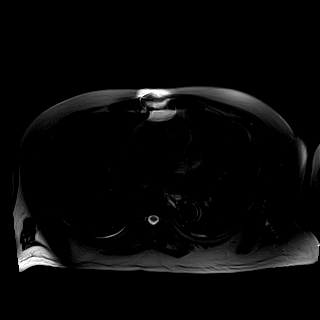

[Series 7: ax dwi_tracew · axial · 6.0mm · 1.42mm/px · z∈[-141,+96]mm · 5 of 102 slices shown]
[im 1/102]
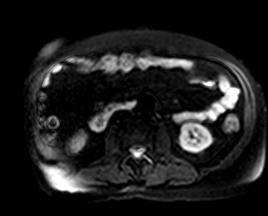
[im 26/102]
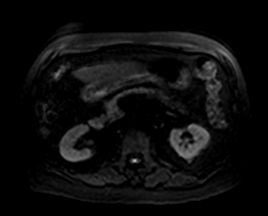
[im 51/102]
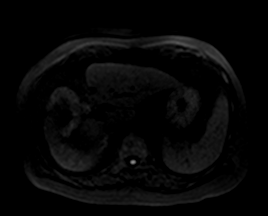
[im 76/102]
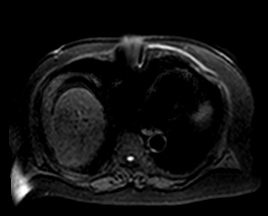
[im 102/102]
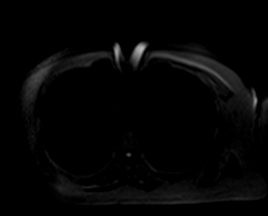

[Series 8: ax dwi_adc · axial · 6.0mm · 1.42mm/px · z∈[-141,+96]mm · 2 of 34 slices shown]
[im 1/34]
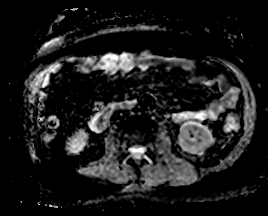
[im 34/34]
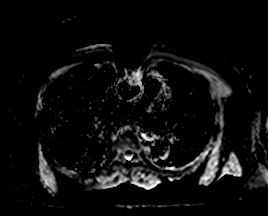

[Series 9: T1 · axial · 6.0mm · 0.74mm/px · z∈[-162,+61]mm · 4 of 64 slices shown]
[im 1/64]
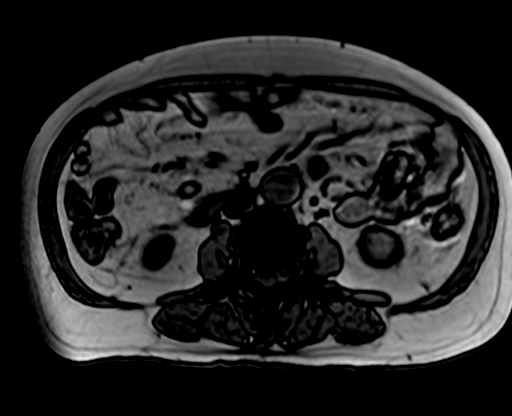
[im 22/64]
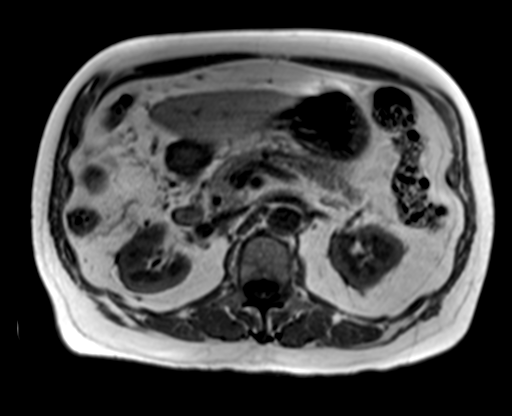
[im 43/64]
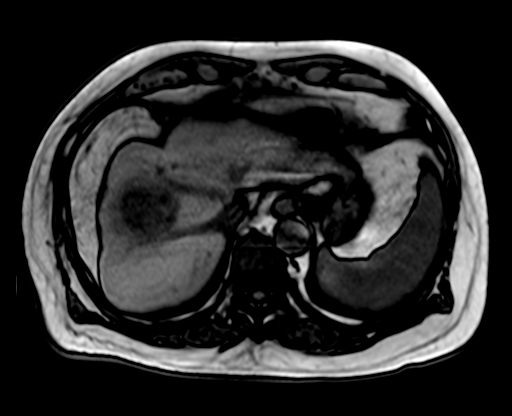
[im 64/64]
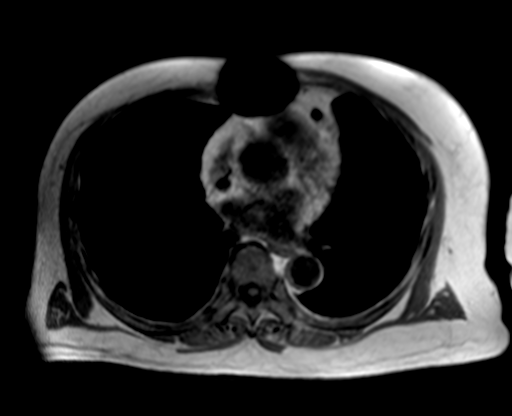

[Series 10: bSSFP · axial · 6.0mm · 0.74mm/px · 1 of 32 slices shown]
[im 1/32]
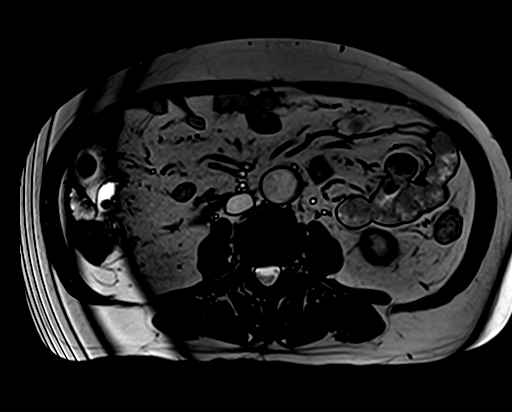

[Series 11: T1 dynamic fat-sat · axial · non-contrast · 3.0mm · 1.19mm/px · z∈[-169,+68]mm · 3 of 80 slices shown (1 of 5)]
[im 1/80]
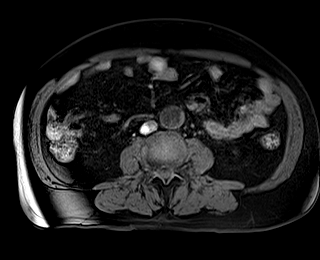
[im 40/80]
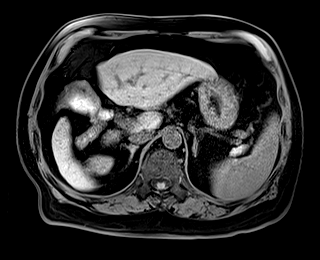
[im 80/80]
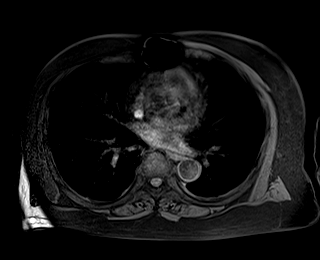

[Series 12: T1 dynamic fat-sat post-contrast · axial · 3.0mm · 1.19mm/px · z∈[-169,+68]mm · 3 of 80 slices shown (1 of 4)]
[im 1/80]
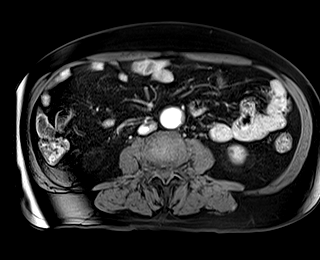
[im 40/80]
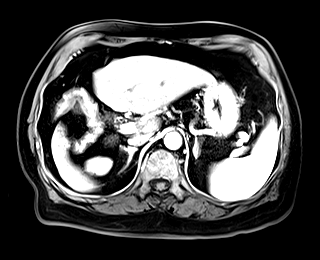
[im 80/80]
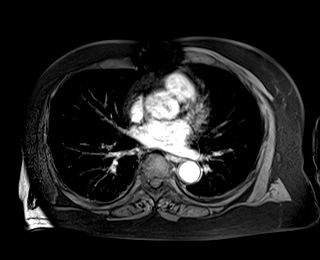

[Series 13: T1 dynamic fat-sat · axial · 3.0mm · 1.19mm/px · z∈[-169,+68]mm · 3 of 80 slices shown (2 of 5)]
[im 1/80]
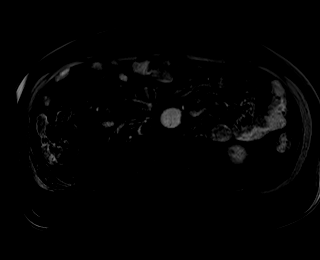
[im 40/80]
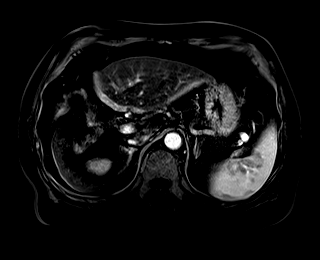
[im 80/80]
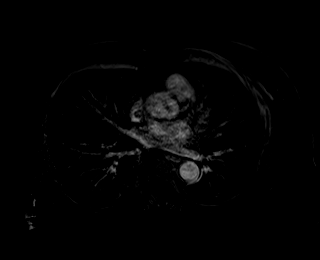

[Series 14: T1 dynamic fat-sat post-contrast · axial · 3.0mm · 1.19mm/px · z∈[-169,+68]mm · 3 of 80 slices shown (2 of 4)]
[im 1/80]
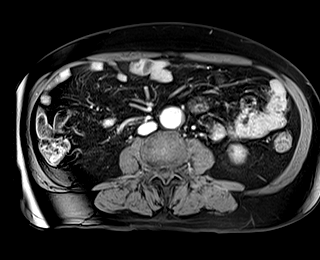
[im 40/80]
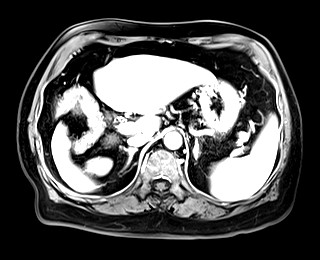
[im 80/80]
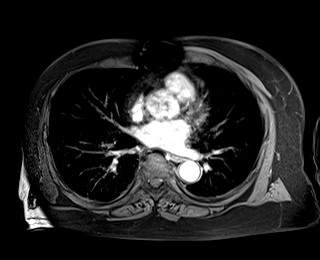

[Series 15: T1 dynamic fat-sat · axial · 3.0mm · 1.19mm/px · z∈[-169,+68]mm · 3 of 80 slices shown (3 of 5)]
[im 1/80]
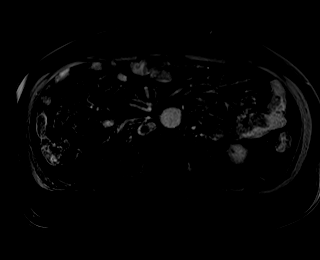
[im 40/80]
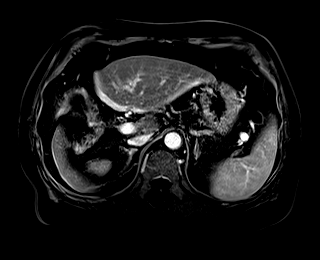
[im 80/80]
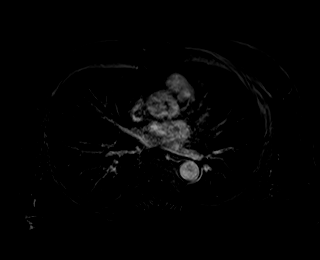

[Series 16: T1 dynamic fat-sat post-contrast · axial · 3.0mm · 1.19mm/px · z∈[-169,+68]mm · 3 of 80 slices shown (3 of 4)]
[im 1/80]
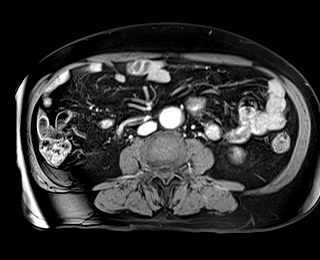
[im 40/80]
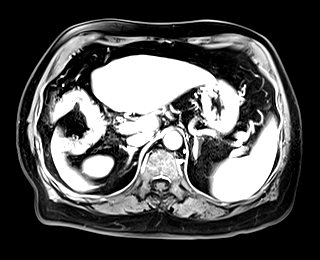
[im 80/80]
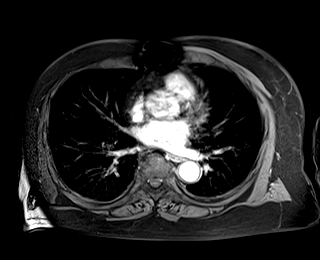

[Series 17: T1 dynamic fat-sat · axial · 3.0mm · 1.19mm/px · z∈[-169,+68]mm · 3 of 80 slices shown (4 of 5)]
[im 1/80]
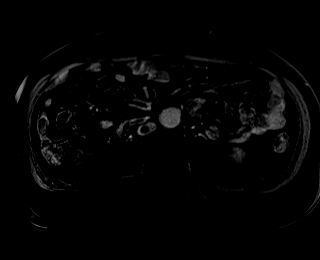
[im 40/80]
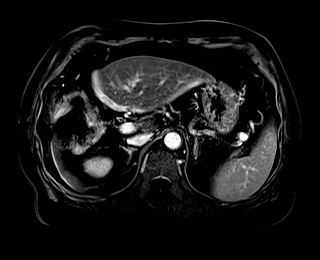
[im 80/80]
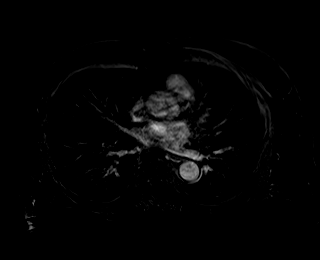

[Series 18: T1 dynamic post-contrast · coronal · 3.0mm · 1.31mm/px · 3 of 72 slices shown]
[im 1/72]
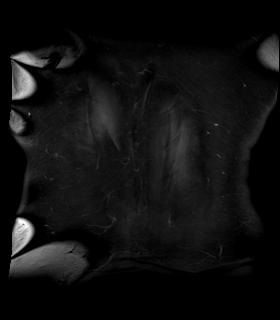
[im 36/72]
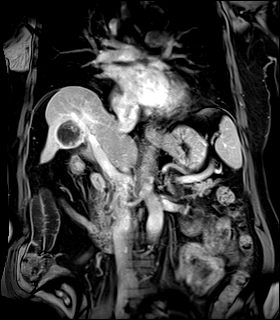
[im 72/72]
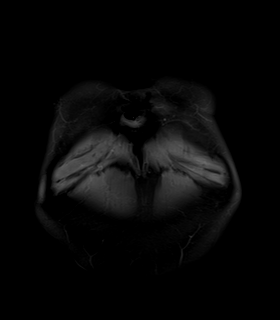

[Series 19: T1 dynamic fat-sat post-contrast · axial · 3.0mm · 1.19mm/px · z∈[-169,+68]mm · 3 of 80 slices shown (4 of 4)]
[im 1/80]
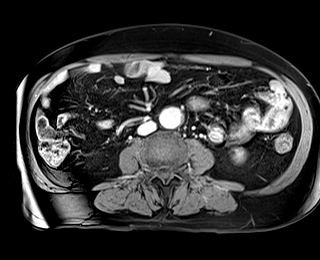
[im 40/80]
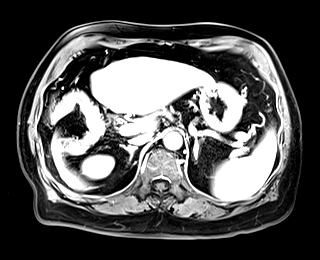
[im 80/80]
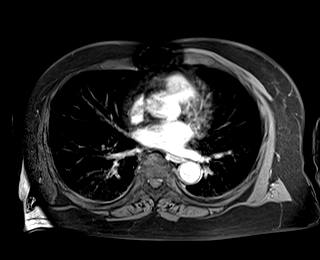

[Series 20: T1 dynamic fat-sat · axial · 3.0mm · 1.19mm/px · z∈[-169,+68]mm · 3 of 80 slices shown (5 of 5)]
[im 1/80]
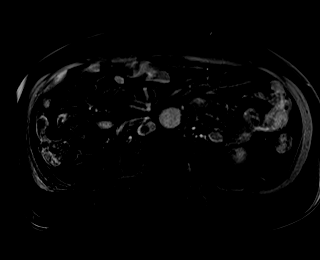
[im 40/80]
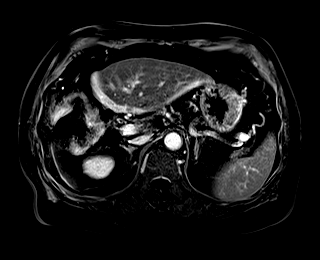
[im 80/80]
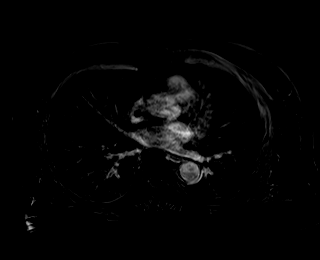

[48 of 48 positions shown; findings below may reference images not displayed]

FINDINGS: Lower chest: No acute findings.

Hepatobiliary: Cirrhosis again demonstrated. Mass in segment 8 shows
mild decrease in size since previous study, currently measuring
x 3.8 cm on image 27/16, compared to 4.9 x 4.3 cm previously. This
mass shows persistent irregular internal and mural contrast
enhancement, similar in appearance to prior study. No other liver
masses are identified.

Tiny gallstones are again noted, however there is no evidence of
cholecystitis or biliary ductal dilatation.

Pancreas:  No mass or inflammatory changes.

Spleen:  Within normal limits in size and appearance.

Adrenals/Urinary Tract: No masses identified. Small left renal cyst
again noted. No evidence of hydronephrosis.

Stomach/Bowel: Visualized portion unremarkable.

Vascular/Lymphatic: No pathologically enlarged lymph nodes
identified. No abdominal aortic aneurysm.

Other:  None.

Musculoskeletal:  No suspicious bone lesions identified.
IMPRESSION: Mild interval decrease in size of right hepatic lobe mass.

No other hepatic masses or evidence of metastatic disease within the
abdomen.

Cholelithiasis. No radiographic evidence of cholecystitis or biliary
ductal dilatation.

## 2020-01-31 MED ORDER — GADOBUTROL 1 MMOL/ML IV SOLN
9.0000 mL | Freq: Once | INTRAVENOUS | Status: AC | PRN
  Administered 2020-01-31: 9 mL via INTRAVENOUS

## 2020-01-31 NOTE — Progress Notes (Signed)
Gastroenterology Consultation  Referring Provider:     Fayrene Helper, NP Primary Care Physician:  Fayrene Helper, NP Primary Gastroenterologist:  Dr. Servando Snare     Reason for Consultation:     Hepatocellular carcinoma        HPI:   Timothy Murray is a 75 y.o. y/o male referred for consultation & management of hepatocellular carcinoma by Dr. Fayrene Helper, NP.  This patient comes in today after being diagnosed and treated for hepatocellular carcinoma.  The patient's MRI done today showed:  IMPRESSION: Mild interval decrease in size of right hepatic lobe mass. No other hepatic masses or evidence of metastatic disease within the abdomen. Cholelithiasis. No radiographic evidence of cholecystitis or biliary ductal dilatation.  In November 2021 the patient was seen by his nurse practitioner for abdominal pain.  The patient's LFTs were normal.  He states that he was diagnosed with hepatitis B when living in [place] although his hepatitis B surface antigen and antibody to core were both negative.  His alpha-fetoprotein 1 month ago was normal at 3.7  The patient reports that he was found to have liver mass after he had imaging done because he had aortic stenosis with a stent placement. At that time he states that he was then diagnosed with a hepatocellular carcinoma in [place]. He now reports that he was in the ER recently for abdominal pain. The abdominal pain is in the left side of his abdomen. There is no report of black stools or bloody stools. He thinks it may be getting better when he eats and worse when he is hungry. He did lose some weight but now he states he is steady at around 208.   Past Medical History:  Diagnosis Date  . Arthritis    "lower back" (10/24/2012)  . Coronary artery disease   . GERD (gastroesophageal reflux disease)   . High cholesterol   . Hypertension   . Type II diabetes mellitus (HCC)     Past Surgical History:  Procedure Laterality Date  .  APPENDECTOMY  2002  . CARDIAC CATHETERIZATION  10/24/2012  . CORONARY ARTERY BYPASS GRAFT N/A 10/26/2012   Procedure: CORONARY ARTERY BYPASS GRAFTING (CABG);  Surgeon: Loreli Slot, MD;  Location: [facility] OR;  Service: Open Heart Surgery;  Laterality: N/A;  Times 3 using left internal mammary artery and endoscopically harvested right saphenous vein    Prior to Admission medications   Medication Sig Start Date End Date Taking? Authorizing Provider  allopurinol (ZYLOPRIM) 100 MG tablet Take 100 mg by mouth daily. Patient not taking: No sig reported 11/26/16   [provider]  aspirin EC 81 MG tablet Take 81 mg by mouth daily. Patient not taking: No sig reported    [provider]  atorvastatin (LIPITOR) 20 MG tablet Take 1 tablet (20 mg total) by mouth daily at 6 PM. Patient taking differently: Take 40 mg by mouth daily at 6 PM. 10/30/12   Ardelle Balls, PA-C  fluocinonide cream (LIDEX) 0.05 %  11/17/12   [provider]  furosemide (LASIX) 40 MG tablet Take 40 mg by mouth daily. Patient not taking: No sig reported    [provider]  glimepiride (AMARYL) 1 MG tablet Take 1 mg by mouth daily.    [provider]  glucosamine-chondroitin 500-400 MG tablet Take 1 tablet by mouth daily.    [provider]  ibuprofen (ADVIL,MOTRIN) 600 MG tablet Take 1 tablet (600 mg total) by mouth every  6 (six) hours as needed. Patient not taking: No sig reported 06/09/15   Beers, Pierce Crane, PA-C  losartan (COZAAR) 25 MG tablet Take 25 mg by mouth daily.    [provider]  metoprolol tartrate (LOPRESSOR) 25 MG tablet Take 25 mg by mouth daily.    [provider]  Multiple Vitamins-Minerals (MEGA MULTIVITAMIN FOR MEN) TABS Take 1 tablet by mouth daily. Patient not taking: No sig reported    [provider]  niacin 500 MG CR capsule Take 1 capsule (500 mg total) by mouth at bedtime. Patient not taking: No sig reported  10/30/12   Nani Skillern, PA-C  omeprazole (PRILOSEC) 20 MG capsule Take 40 mg by mouth every morning.    [provider]  oxyCODONE-acetaminophen (PERCOCET) 5-325 MG tablet Take 1 tablet by mouth every 4 (four) hours as needed for severe pain. 01/01/20 12/31/20  Lavonia Drafts, MD  potassium chloride SA (K-DUR,KLOR-CON) 20 MEQ tablet Take 20 mEq by mouth daily. Patient not taking: Reported on 12/26/2019    [provider]  saxagliptin HCl (ONGLYZA) 2.5 MG TABS tablet Take 2.5 mg by mouth daily. Patient not taking: No sig reported    [provider]  zolpidem (AMBIEN) 10 MG tablet Take 10 mg by mouth at bedtime as needed for sleep. Patient not taking: No sig reported    [provider]    Family History  Problem Relation Age of Onset  . Cancer Sister      Social History   Tobacco Use  . Smoking status: Former Smoker    Packs/day: 0.50    Years: 30.00    Pack years: 15.00    Types: Cigarettes    Quit date: 08/28/2001    Years since quitting: 18.4  . Smokeless tobacco: Never Used  Substance Use Topics  . Alcohol use: No    Alcohol/week: 0.0 standard drinks    Comment: 10/24/2012 "stopped drinking alcohol in 2003; used to drink ~ 9 bottles beer/wk"  . Drug use: No    Allergies as of 01/31/2020 - Review Complete 01/01/2020  Allergen Reaction Noted  . Other Nausea And Vomiting 01/16/2015  . Soap & cleansers  10/24/2012  . Latex Rash and Itching 10/24/2012    Review of Systems:    All systems reviewed and negative except where noted in HPI.   Physical Exam:  There were no vitals taken for this visit. No LMP for male patient. General:   Alert,  Well-developed, well-nourished, pleasant and cooperative in NAD Head:  Normocephalic and atraumatic. Eyes:  Sclera clear, no icterus.   Conjunctiva pink. Ears:  Normal auditory acuity. Neck:  Supple; no masses or thyromegaly. Lungs:  Respirations even and unlabored.  Clear throughout to  auscultation.   No wheezes, crackles, or rhonchi. No acute distress. Heart:  Regular rate and rhythm; no murmurs, clicks, rubs, or gallops. Abdomen:  Normal bowel sounds.  No bruits.  Soft, positive tenderness to 1 finger palpation while flexing the abdominal wall muscles and non-distended without masses, hepatosplenomegaly or hernias noted.  No guarding or rebound tenderness. Positive Carnett sign.   Rectal:  Deferred.  Pulses:  Normal pulses noted. Extremities:  No clubbing or edema.  No cyanosis. Neurologic:  Alert and oriented x3;  grossly normal neurologically. Skin:  Intact without significant lesions or rashes.  No jaundice. Lymph Nodes:  No significant cervical adenopathy. Psych:  Alert and cooperative. Normal mood and affect.  Imaging Studies: MR Abdomen W Wo Contrast  Result Date:  01/31/2020 CLINICAL DATA:  Hepatobiliary carcinoma. Follow-up treatment response to proton therapy. EXAM: MRI ABDOMEN WITHOUT AND WITH CONTRAST TECHNIQUE: Multiplanar multisequence MR imaging of the abdomen was performed both before and after the administration of intravenous contrast. CONTRAST:  85mL GADAVIST GADOBUTROL 1 MMOL/ML IV SOLN COMPARISON:  10/26/2019 from [place]: Lower chest: No acute findings. Hepatobiliary: Cirrhosis again demonstrated. Mass in segment 8 shows mild decrease in size since previous study, currently measuring 4.2 x 3.8 cm on image 27/16, compared to 4.9 x 4.3 cm previously. This mass shows persistent irregular internal and mural contrast enhancement, similar in appearance to prior study. No other liver masses are identified. Tiny gallstones are again noted, however there is no evidence of cholecystitis or biliary ductal dilatation. Pancreas:  No mass or inflammatory changes. Spleen:  Within normal limits in size and appearance. Adrenals/Urinary Tract: No masses identified. Small left renal cyst again noted. No evidence of hydronephrosis. Stomach/Bowel: Visualized  portion unremarkable. Vascular/Lymphatic: No pathologically enlarged lymph nodes identified. No abdominal aortic aneurysm. Other:  None. Musculoskeletal:  No suspicious bone lesions identified. IMPRESSION: Mild interval decrease in size of right hepatic lobe mass. No other hepatic masses or evidence of metastatic disease within the abdomen. Cholelithiasis. No radiographic evidence of cholecystitis or biliary ductal dilatation. Electronically Signed   By: Marlaine Hind M.D.   On: 01/31/2020 08:58    Assessment and Plan:   Timothy Murray is a 75 y.o. y/o male who comes in today with a history of hepatocellular carcinoma being treated by oncology. The patient had an MRI with a slight decrease in the size of the mass that was done this morning. The patient states that his pain was worse in the ER and is not bad at the present time. He was sent home with ibuprofen from the ER. On physical exam the patient clearly has musculoskeletal pain which was reproducible by 1 finger palpation while lifting the leg 6 inches above the exam table. The patient does have left-sided pain that is worse when he does not eat and then better when he eats therefore I will set him up for an EGD for his dyspepsia. The patient also states that he takes omeprazole as needed for his heartburn. The patient has been explained the plan and agrees with it.    Lucilla Lame, MD. Marval Regal    Note: This dictation was prepared with Dragon dictation along with smaller phrase technology. Any transcriptional errors that result from this process are unintentional.

## 2020-02-05 ENCOUNTER — Other Ambulatory Visit: Admission: RE | Admit: 2020-02-05 | Payer: Medicare HMO | Source: Ambulatory Visit

## 2020-02-07 ENCOUNTER — Ambulatory Visit: Admission: RE | Admit: 2020-02-07 | Payer: Medicare HMO | Source: Home / Self Care | Admitting: Gastroenterology

## 2020-02-07 ENCOUNTER — Encounter: Admission: RE | Payer: Self-pay | Source: Home / Self Care

## 2020-02-07 SURGERY — ESOPHAGOGASTRODUODENOSCOPY (EGD) WITH PROPOFOL
Anesthesia: General

## 2020-02-07 NOTE — Progress Notes (Signed)
[facility]  [address], [address] [place], [place] [address] Phone: [phone]  Fax: [phone]   Clinic Day: 02/11/20  Referring physician: Danelle Berry, NP  Chief Complaint: Timothy Murray is a 75 y.o. male with hepatocellular carcinoma and thrombocytopenia who is seen for 6 week assessment.  HPI: The patient was last seen in the hematology clinic on 12/26/2019. At that time, he had intermittent abdominal pain.  Exam revealed mild tenderness in the RUQ without guarding or rebound tenderness.  The patient was seen in the ER on 01/01/2020 with abdominal pain. Chest, abdomen, and pelvis CT showed no signs of dissection, bleeding, or intra-abdominal ascites. There was suspicion for possible cholecystitis with a stone stuck in the neck of the gallbladder. RUQ ultrasound showed cholelithiasis with intermingled sludge in gallbladder wall thickening. Appearance concerning for potential degree of cholecystitis. The apparent calculus in the neck of the gallbladder seen on CT earlier in the day was not well seen by ultrasound. The appearance of the liver raised concern for a degree of underlying cirrhosis. The echogenic mass in the right lobe of the liver was again noted measuring 5.2 x 3.7 x 3.7 cm. He was given IV morphine and Zofran. Dr. Lysle Pearl was consulted.  The patient saw Dr. Mariah Milling ([facility]) on 01/08/2020.  He was not a surgical candidate.  The patient established care with Dr. Leamon Arnt ([facility]) on 01/08/2020.  He was found to have hepatocellular carcinoma, BCLC A (stage T2N0), Child-Pugh class A.  Abdominal MRI with and without contrast on 10/26/2019 revealed decrease in the size of segment VIII mass c/w HCC which demonstrated viable tumor with thick rim of enhancement (previously 6 x 5.4 x 4.8 cm, now 5 x 4.3 x 4.4 cm).  AFP was 2.1.  As he was not a surgical candidate, abdominal MRI and AFP were recommended every 6 months and chest CT every 12 months  assuming no progression.  If there was progression and he was not a candidate for surgery, immunotherapy may be an option (atezolizumab plus bevacizumab).  Regarding his abdominal pain, if the imaging did not identify an obvious source, an upper endoscopy and follow-up with gastroenterology was recommended.  Abdominal MRI with and without contrast on 01/31/2020 revealed mild interval decrease in size of right hepatic lobe mass. There were no other hepatic masses or evidence of metastatic disease within the abdomen. There was cholelithiasis. There was no radiographic evidence of cholecystitis or biliary ductal dilatation.  The patient saw Dr. Allen Norris on 01/31/2020. His pain was not bad at that time. It was worse when he was hungry and better when he ate. He was taking omeprazole for heartburn. They discussed an EGD to evaluate dyspepsia. EGD was scheduled for 02/07/2020 but was cancelled.  During the interim, he has felt "fine".  He was seen by Dr. Verl Blalock in 01/31/2020.  He was felt to have musculoskeletal pain with pain induced by lifting his leg 6 inches above the table.  EGD was cancelled on 02/07/2020.  He denies any nausea, vomiting or diarrhea.  He is avoiding greasy and fried food.  He is eating lean meat.  Pain is rated at a 1 or 2.   Past Medical History:  Diagnosis Date  . Arthritis    "lower back" (10/24/2012)  . Coronary artery disease   . GERD (gastroesophageal reflux disease)   . High cholesterol   . Hypertension   . Type II diabetes mellitus ([place])     Past Surgical History:  Procedure Laterality Date  . APPENDECTOMY  2002  . CARDIAC CATHETERIZATION  10/24/2012  . CORONARY ARTERY BYPASS GRAFT N/A 10/26/2012   Procedure: CORONARY ARTERY BYPASS GRAFTING (CABG);  Surgeon: Melrose Nakayama, MD;  Location: [facility];  Service: Open Heart Surgery;  Laterality: N/A;  Times 3 using left internal mammary artery and endoscopically harvested right saphenous vein    Family History  Problem  Relation Age of Onset  . Cancer Sister     Social History:  reports that he quit smoking about 18 years ago. His smoking use included cigarettes. He has a 15.00 pack-year smoking history. He has never used smokeless tobacco. He reports that he does not drink alcohol and does not use drugs. He stopped smoking and drinking in alcohol in 2003. Before that, he drank a couple of times per week. The patient's wife's name is [name]. He is alone  today.  Allergies:  Allergies  Allergen Reactions  . Other Nausea And Vomiting  . Soap & Cleansers     "liquid soap" antibacterial -rash  . Latex Rash and Itching    Current Medications: Current Outpatient Medications  Medication Sig Dispense Refill  . aspirin EC 81 MG tablet Take 81 mg by mouth daily.    Marland Kitchen atorvastatin (LIPITOR) 20 MG tablet Take 1 tablet (20 mg total) by mouth daily at 6 PM. (Patient taking differently: Take 40 mg by mouth daily at 6 PM.) 30 tablet 1  . fluocinonide cream (LIDEX) 0.05 %     . glimepiride (AMARYL) 1 MG tablet Take 1 mg by mouth daily.    Marland Kitchen glucosamine-chondroitin 500-400 MG tablet Take 1 tablet by mouth daily.    Marland Kitchen ibuprofen (ADVIL,MOTRIN) 600 MG tablet Take 1 tablet (600 mg total) by mouth every 6 (six) hours as needed. 30 tablet 0  . losartan (COZAAR) 25 MG tablet Take 25 mg by mouth daily.    . metoprolol tartrate (LOPRESSOR) 25 MG tablet Take 25 mg by mouth daily.    Marland Kitchen omeprazole (PRILOSEC) 20 MG capsule Take 40 mg by mouth every morning.    Marland Kitchen oxyCODONE-acetaminophen (PERCOCET) 5-325 MG tablet Take 1 tablet by mouth every 4 (four) hours as needed for severe pain. 20 tablet 0  . allopurinol (ZYLOPRIM) 100 MG tablet Take 100 mg by mouth daily. (Patient not taking: No sig reported)  3  . furosemide (LASIX) 40 MG tablet Take 40 mg by mouth daily. (Patient not taking: No sig reported)    . potassium chloride SA (K-DUR,KLOR-CON) 20 MEQ tablet Take 20 mEq by mouth daily. (Patient not taking: No sig reported)    .  saxagliptin HCl (ONGLYZA) 2.5 MG TABS tablet Take 2.5 mg by mouth daily. (Patient not taking: No sig reported)    . zolpidem (AMBIEN) 10 MG tablet Take 10 mg by mouth at bedtime as needed for sleep. (Patient not taking: No sig reported)     No current facility-administered medications for this visit.    Review of Systems  Constitutional: Positive for weight loss (3 pounds). Negative for chills, diaphoresis, fever and malaise/fatigue.       Feels "fine".  HENT: Negative for congestion, ear discharge, ear pain, nosebleeds, sinus pain and sore throat.   Eyes: Negative.  Negative for blurred vision, double vision and photophobia.  Respiratory: Negative.  Negative for cough, hemoptysis, sputum production and shortness of breath.   Cardiovascular: Negative.  Negative for chest pain, palpitations, orthopnea, claudication and leg swelling.  Gastrointestinal: Negative for blood in stool,  diarrhea, heartburn, melena, nausea and vomiting.       EGD cancelled.  Genitourinary: Positive for frequency (nocturia, 3-4 x per night). Negative for dysuria, flank pain, hematuria and urgency.  Musculoskeletal: Negative.  Negative for back pain, joint pain, myalgias and neck pain.  Skin: Negative.  Negative for itching and rash.  Neurological: Negative for dizziness, tingling, tremors, sensory change, speech change, focal weakness, weakness and headaches.  Endo/Heme/Allergies: Does not bruise/bleed easily.       Diabetes.  Psychiatric/Behavioral: Negative for depression and memory loss. The patient is not nervous/anxious and does not have insomnia.   All other systems reviewed and are negative.  Performance status (ECOG): 1  Vitals Blood pressure (!) 131/59, pulse 70, temperature (!) 96 F (35.6 C), temperature source Tympanic, resp. rate 16, weight 205 lb 7.5 oz (93.2 kg), SpO2 99 %.   Physical Exam Vitals and nursing note reviewed.  Constitutional:      General: He is not in acute distress.     Appearance: Normal appearance. He is not ill-appearing or toxic-appearing.  HENT:     Head: Normocephalic and atraumatic.     Mouth/Throat:     Mouth: Mucous membranes are moist.     Pharynx: Oropharynx is clear. No oropharyngeal exudate or posterior oropharyngeal erythema.  Eyes:     General: No scleral icterus.    Conjunctiva/sclera: Conjunctivae normal.     Pupils: Pupils are equal, round, and reactive to light.  Cardiovascular:     Rate and Rhythm: Normal rate and regular rhythm.  Pulmonary:     Effort: No respiratory distress.     Breath sounds: Normal breath sounds. No wheezing, rhonchi or rales.  Chest:  Breasts:     Right: No axillary adenopathy or supraclavicular adenopathy.     Left: No axillary adenopathy or supraclavicular adenopathy.    Abdominal:     General: Abdomen is flat. There is no distension.     Palpations: There is no splenomegaly or mass.     Tenderness: There is abdominal tenderness (slight). There is no guarding or rebound.     Comments: Liver edge palpable.  Musculoskeletal:        General: No swelling or tenderness.     Right lower leg: No edema.     Left lower leg: No edema.  Lymphadenopathy:     Head:     Right side of head: No preauricular, posterior auricular or occipital adenopathy.     Left side of head: No preauricular, posterior auricular or occipital adenopathy.     Cervical: No cervical adenopathy.     Upper Body:     Right upper body: No supraclavicular or axillary adenopathy.     Left upper body: No supraclavicular or axillary adenopathy.     Lower Body: No right inguinal adenopathy. No left inguinal adenopathy.  Skin:    General: Skin is warm and dry.     Coloration: Skin is not jaundiced or pale.     Findings: No bruising, erythema or rash.  Neurological:     General: No focal deficit present.     Mental Status: He is alert and oriented to person, place, and time. Mental status is at baseline.  Psychiatric:        Mood and  Affect: Mood normal.        Behavior: Behavior normal.        Thought Content: Thought content normal.        Judgment: Judgment normal.  No visits with results within 3 Day(s) from this visit.  Latest known visit with results is:  Admission on 01/01/2020, Discharged on 01/01/2020  Component Date Value Ref Range Status  . WBC 01/01/2020 5.7  4.0 - 10.5 K/uL Final  . RBC 01/01/2020 4.14* 4.22 - 5.81 MIL/uL Final  . Hemoglobin 01/01/2020 13.9  13.0 - 17.0 g/dL Final  . HCT 01/01/2020 39.1  39.0 - 52.0 % Final  . MCV 01/01/2020 94.4  80.0 - 100.0 fL Final  . MCH 01/01/2020 33.6  26.0 - 34.0 pg Final  . MCHC 01/01/2020 35.5  30.0 - 36.0 g/dL Final  . RDW 01/01/2020 14.5  11.5 - 15.5 % Final  . Platelets 01/01/2020 61* 150 - 400 K/uL Final   Comment: Immature Platelet Fraction may be clinically indicated, consider ordering this additional test [id number]   . nRBC 01/01/2020 0.0  0.0 - 0.2 % Final   Performed at [facility], [address] [place], [place] [address]  . Sodium 01/01/2020 136  135 - 145 mmol/L Final  . Potassium 01/01/2020 3.6  3.5 - 5.1 mmol/L Final  . Chloride 01/01/2020 100  98 - 111 mmol/L Final  . CO2 01/01/2020 26  22 - 32 mmol/L Final  . Glucose, Bld 01/01/2020 191* 70 - 99 mg/dL Final   Glucose reference range applies only to samples taken after fasting for at least 8 hours.  . BUN 01/01/2020 12  8 - 23 mg/dL Final  . Creatinine, Ser 01/01/2020 1.00  0.61 - 1.24 mg/dL Final  . Calcium 01/01/2020 8.8* 8.9 - 10.3 mg/dL Final  . Total Protein 01/01/2020 7.8  6.5 - 8.1 g/dL Final  . Albumin 01/01/2020 4.6  3.5 - 5.0 g/dL Final  . AST 01/01/2020 36  15 - 41 U/L Final  . ALT 01/01/2020 42  0 - 44 U/L Final  . Alkaline Phosphatase 01/01/2020 73  38 - 126 U/L Final  . Total Bilirubin 01/01/2020 1.5* 0.3 - 1.2 mg/dL Final  . GFR, Estimated 01/01/2020 >60  >60 mL/min Final   Comment: (NOTE) Calculated using the CKD-EPI Creatinine Equation (2021)   .  Anion gap 01/01/2020 10  5 - 15 Final   Performed at [facility], [address] [place], [place] [address]  . Lipase 01/01/2020 41  11 - 51 U/L Final   Performed at [facility], [address] [place], [place] [address]  . Color, Urine 01/01/2020 YELLOW* YELLOW Final  . APPearance 01/01/2020 CLEAR* CLEAR Final  . Specific Gravity, Urine 01/01/2020 1.025  1.005 - 1.030 Final  . pH 01/01/2020 5.0  5.0 - 8.0 Final  . Glucose, UA 01/01/2020 50* NEGATIVE mg/dL Final  . Hgb urine dipstick 01/01/2020 NEGATIVE  NEGATIVE Final  . Bilirubin Urine 01/01/2020 NEGATIVE  NEGATIVE Final  . Ketones, ur 01/01/2020 NEGATIVE  NEGATIVE mg/dL Final  . Protein, ur 01/01/2020 30* NEGATIVE mg/dL Final  . Nitrite 01/01/2020 NEGATIVE  NEGATIVE Final  . Chalmers Guest 01/01/2020 NEGATIVE  NEGATIVE Final  . RBC / HPF 01/01/2020 0-5  0 - 5 RBC/hpf Final  . WBC, UA 01/01/2020 0-5  0 - 5 WBC/hpf Final  . Bacteria, UA 01/01/2020 NONE SEEN  NONE SEEN Final  . Squamous Epithelial / LPF 01/01/2020 0-5  0 - 5 Final  . Mucus 01/01/2020 PRESENT   Final  . Hyaline Casts, UA 01/01/2020 PRESENT   Final   Performed at [facility], [address], [place], [place] [address]  . Troponin  I (High Sensitivity) 01/01/2020 5  <18 ng/L Final   Comment: (NOTE) Elevated high sensitivity troponin I (hsTnI) values and significant  changes across serial measurements may suggest ACS but many other  chronic and acute conditions are known to elevate hsTnI results.  Refer to the "Links" section for chest pain algorithms and additional  guidance. Performed at [facility], [address], [place], [place] [address]     Assessment:  Ajit Yohey is a 75 y.o. male  with mild thrombocytopenia and clinical stage T2N0 hepatocellular carcinoma s/p proton therapy. Platele count has ranged between 94,000 - 126,000 since 06/2011. He denies any new medications or herbal products.  Work-upon  12/07/2016 revealed a hematocrit 45.1, hemoglobin 15.7, MCV 96.6, platelets 114,000, WBC 6200 with an ANC of 3500. Differential was unremarkable. Peripheral smearrevealed platelets of various sizes, some large body platelets. Normal studiesincluded: PT, PTT, SPEP, free light chains, H pylori antibody, hepatitis B core antibody, hepatitis C antibody, HIV testing, ANA, B12, and folate.   He has hepatocellular carcinoma s/p liver FNA on 04/20/2019.  Pathology revealed moderately differentiated (grade 2) hepatocellular carcinoma.  Clinical stage was T2N0 lesion involving both hepatic segments VIII and V.  He declined surgery.  Child-Pugh class A.  Hepatitis B core antibody, hepatitis B surface antigen, and hepatitis C antibody were non reactive on 12/10/2019.    PET scan on 04/30/2019 revealed a 4.5 cm subtle lesion in the right lobe of the liver without increased FDG uptake. There was no focal uptake elsewhere to suggest metastatic disease or other malignancy.   He completed a course of proton beam radiation. He received 70 CGE in 25 fractions.  Radiation completed on 06/25/2019.  AFP has been followed:  5.6 on 10/26/2019 and 3.7 on 12/10/2019.  Abdominal MRI on 10/26/2019 at [facility] in [place] revealed decreased size of the segment VIII mass c/w hepatocellular carcinoma which demonstrated viable tumor with thick rim of enhancement (previously 6 x 5.4 x 4.8 cm, now 5 x 4.3 x 4.4 cm). There were no new suspicious hepatic lesions. There was cirrhosis with portal hypertension including splenomegaly (13.5 cm) and small upper abdominal collaterals.  Symptomatically, he feels "fine".  Pain is rated as a 1 or 2.  Exam is unremarkable.  Plan: 1.   Review interim labs. 2.   Clinical stage T2N0 hepatocellular carcinoma             Patient was diagnosed incidentally on follow-up of an aortic stent.             Pathology on 04/20/2019 confirmed grade 2 hepatocellular  carcinoma.             Patient was offered proton beam therapy versus surgery.  He elected to pursue radiation.                         He completed radiation on 06/25/2019.             Abdominal MRI on 10/26/2019 revealed decrease in size of the segment VIII mass c/w hepatocellular carcinoma.                         There was a thick rim of enhancement.  There are no new suspicious lesions.            Discuss interval evaluation at [facility].  Notes reviewed.  Review plan for abdominal MRI and AFP every 6 months.  Anticipate next imaging on 07/30/2020.  Review plan for chest CT yearly.   Anticipate next imaging on 12/31/2020.  Multiple questions asked and answered. 3.   Thrombocytopenia             Platelet count was 61,000 on 01/01/2020.    Platelet count ranges between 61,000 - 65,000.               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  Hepatitis B and C serologies were negative on 12/10/2019.  Continue to monitor. 4.   Abdominal pain  Patient has intermittent abdominal pain.  RUQ ultrasound on 12/25/2019 revealed cholelithiasis and sludge within the gallbladder with gallbladder wall thickening.   Imaging suggests symptomatic cholelithiasis.   Patient has changed his diet.  EGD for dyspepsia was scheduled then postponed.  Notes from Dr. Allen Norris reviewed.  Part of pain may be musculoskeletal in origin. 5.   RTC on 03/31/2020 for labs (CBC with diff, CMP, B12, folate, IPF). 6.   Abdomen MRI on 07/30/2020. 7.   RTC after MRI for MD assess, labs (CBC with diff, CMP, AFP- draw before MRI), and review of MRI.  I discussed the assessment and treatment plan with the patient.  The patient was provided an opportunity to ask questions and all were answered.  The patient agreed with the plan and demonstrated an understanding of the instructions.  The patient was advised to call back if the symptoms worsen or if the condition fails to improve as anticipated.   Lequita Asal, MD, PhD 02/11/2020, 5:28 PM

## 2020-02-11 ENCOUNTER — Other Ambulatory Visit: Payer: Self-pay

## 2020-02-11 ENCOUNTER — Inpatient Hospital Stay: Payer: Medicare HMO | Attending: Hematology and Oncology | Admitting: Hematology and Oncology

## 2020-02-11 ENCOUNTER — Encounter: Payer: Self-pay | Admitting: Hematology and Oncology

## 2020-02-11 VITALS — BP 131/59 | HR 70 | Temp 96.0°F | Resp 16 | Wt 205.5 lb

## 2020-02-11 DIAGNOSIS — C220 Liver cell carcinoma: Secondary | ICD-10-CM

## 2020-02-11 DIAGNOSIS — D696 Thrombocytopenia, unspecified: Secondary | ICD-10-CM

## 2020-02-14 DIAGNOSIS — C220 Liver cell carcinoma: Secondary | ICD-10-CM | POA: Diagnosis not present

## 2020-02-14 DIAGNOSIS — G8929 Other chronic pain: Secondary | ICD-10-CM | POA: Diagnosis not present

## 2020-02-14 DIAGNOSIS — I10 Essential (primary) hypertension: Secondary | ICD-10-CM | POA: Diagnosis not present

## 2020-02-14 DIAGNOSIS — E1151 Type 2 diabetes mellitus with diabetic peripheral angiopathy without gangrene: Secondary | ICD-10-CM | POA: Diagnosis not present

## 2020-02-14 DIAGNOSIS — E785 Hyperlipidemia, unspecified: Secondary | ICD-10-CM | POA: Diagnosis not present

## 2020-02-14 DIAGNOSIS — Z803 Family history of malignant neoplasm of breast: Secondary | ICD-10-CM | POA: Diagnosis not present

## 2020-02-14 DIAGNOSIS — Z7982 Long term (current) use of aspirin: Secondary | ICD-10-CM | POA: Diagnosis not present

## 2020-02-14 DIAGNOSIS — I2510 Atherosclerotic heart disease of native coronary artery without angina pectoris: Secondary | ICD-10-CM | POA: Diagnosis not present

## 2020-02-14 DIAGNOSIS — K219 Gastro-esophageal reflux disease without esophagitis: Secondary | ICD-10-CM | POA: Diagnosis not present

## 2020-02-14 DIAGNOSIS — Z7984 Long term (current) use of oral hypoglycemic drugs: Secondary | ICD-10-CM | POA: Diagnosis not present

## 2020-02-18 DIAGNOSIS — I959 Hypotension, unspecified: Secondary | ICD-10-CM | POA: Diagnosis not present

## 2020-02-18 DIAGNOSIS — I2510 Atherosclerotic heart disease of native coronary artery without angina pectoris: Secondary | ICD-10-CM | POA: Diagnosis not present

## 2020-02-18 DIAGNOSIS — R0683 Snoring: Secondary | ICD-10-CM | POA: Insufficient documentation

## 2020-03-11 DIAGNOSIS — I2510 Atherosclerotic heart disease of native coronary artery without angina pectoris: Secondary | ICD-10-CM | POA: Diagnosis not present

## 2020-03-11 DIAGNOSIS — G4719 Other hypersomnia: Secondary | ICD-10-CM | POA: Diagnosis not present

## 2020-03-11 DIAGNOSIS — R0681 Apnea, not elsewhere classified: Secondary | ICD-10-CM | POA: Diagnosis not present

## 2020-03-11 DIAGNOSIS — R0683 Snoring: Secondary | ICD-10-CM | POA: Diagnosis not present

## 2020-03-19 DIAGNOSIS — I10 Essential (primary) hypertension: Secondary | ICD-10-CM | POA: Diagnosis not present

## 2020-03-19 DIAGNOSIS — D696 Thrombocytopenia, unspecified: Secondary | ICD-10-CM | POA: Diagnosis not present

## 2020-03-19 DIAGNOSIS — R7301 Impaired fasting glucose: Secondary | ICD-10-CM | POA: Diagnosis not present

## 2020-03-19 DIAGNOSIS — E785 Hyperlipidemia, unspecified: Secondary | ICD-10-CM | POA: Diagnosis not present

## 2020-03-19 DIAGNOSIS — R5383 Other fatigue: Secondary | ICD-10-CM | POA: Diagnosis not present

## 2020-03-20 DIAGNOSIS — I10 Essential (primary) hypertension: Secondary | ICD-10-CM | POA: Diagnosis not present

## 2020-03-20 DIAGNOSIS — R5383 Other fatigue: Secondary | ICD-10-CM | POA: Diagnosis not present

## 2020-03-20 DIAGNOSIS — E138 Other specified diabetes mellitus with unspecified complications: Secondary | ICD-10-CM | POA: Diagnosis not present

## 2020-03-20 DIAGNOSIS — J029 Acute pharyngitis, unspecified: Secondary | ICD-10-CM | POA: Diagnosis not present

## 2020-03-31 ENCOUNTER — Inpatient Hospital Stay: Payer: Medicare HMO | Attending: Hematology and Oncology

## 2020-03-31 ENCOUNTER — Other Ambulatory Visit: Payer: Self-pay

## 2020-03-31 DIAGNOSIS — I10 Essential (primary) hypertension: Secondary | ICD-10-CM | POA: Diagnosis not present

## 2020-03-31 DIAGNOSIS — Z923 Personal history of irradiation: Secondary | ICD-10-CM | POA: Insufficient documentation

## 2020-03-31 DIAGNOSIS — E138 Other specified diabetes mellitus with unspecified complications: Secondary | ICD-10-CM | POA: Diagnosis not present

## 2020-03-31 DIAGNOSIS — K766 Portal hypertension: Secondary | ICD-10-CM | POA: Insufficient documentation

## 2020-03-31 DIAGNOSIS — R072 Precordial pain: Secondary | ICD-10-CM | POA: Diagnosis not present

## 2020-03-31 DIAGNOSIS — I2510 Atherosclerotic heart disease of native coronary artery without angina pectoris: Secondary | ICD-10-CM | POA: Diagnosis not present

## 2020-03-31 DIAGNOSIS — E119 Type 2 diabetes mellitus without complications: Secondary | ICD-10-CM | POA: Insufficient documentation

## 2020-03-31 DIAGNOSIS — K7460 Unspecified cirrhosis of liver: Secondary | ICD-10-CM | POA: Diagnosis not present

## 2020-03-31 DIAGNOSIS — D696 Thrombocytopenia, unspecified: Secondary | ICD-10-CM | POA: Diagnosis not present

## 2020-03-31 DIAGNOSIS — C220 Liver cell carcinoma: Secondary | ICD-10-CM | POA: Diagnosis not present

## 2020-03-31 DIAGNOSIS — Z951 Presence of aortocoronary bypass graft: Secondary | ICD-10-CM | POA: Diagnosis not present

## 2020-03-31 DIAGNOSIS — R161 Splenomegaly, not elsewhere classified: Secondary | ICD-10-CM | POA: Insufficient documentation

## 2020-03-31 DIAGNOSIS — D649 Anemia, unspecified: Secondary | ICD-10-CM | POA: Diagnosis not present

## 2020-03-31 LAB — COMPREHENSIVE METABOLIC PANEL
ALT: 38 U/L (ref 0–44)
AST: 25 U/L (ref 15–41)
Albumin: 4.1 g/dL (ref 3.5–5.0)
Alkaline Phosphatase: 58 U/L (ref 38–126)
Anion gap: 7 (ref 5–15)
BUN: 13 mg/dL (ref 8–23)
CO2: 25 mmol/L (ref 22–32)
Calcium: 8.7 mg/dL — ABNORMAL LOW (ref 8.9–10.3)
Chloride: 105 mmol/L (ref 98–111)
Creatinine, Ser: 1.11 mg/dL (ref 0.61–1.24)
GFR, Estimated: >60 mL/min (ref >60)
Glucose, Bld: 147 mg/dL — ABNORMAL HIGH (ref 70–99)
Potassium: 3.7 mmol/L (ref 3.5–5.1)
Sodium: 137 mmol/L (ref 135–145)
Total Bilirubin: 1.7 mg/dL — ABNORMAL HIGH (ref 0.3–1.2)
Total Protein: 6.9 g/dL (ref 6.5–8.1)

## 2020-03-31 LAB — CBC WITH DIFFERENTIAL/PLATELET
Abs Immature Granulocytes: 0.04 10*3/uL (ref 0.00–0.07)
Basophils Absolute: 0 10*3/uL (ref 0.0–0.1)
Basophils Relative: 0 %
Eosinophils Absolute: 0 10*3/uL (ref 0.0–0.5)
Eosinophils Relative: 1 %
HCT: 29.9 % — ABNORMAL LOW (ref 39.0–52.0)
Hemoglobin: 10.5 g/dL — ABNORMAL LOW (ref 13.0–17.0)
Immature Granulocytes: 1 %
Lymphocytes Relative: 23 %
Lymphs Abs: 0.8 10*3/uL (ref 0.7–4.0)
MCH: 34 pg (ref 26.0–34.0)
MCHC: 35.1 g/dL (ref 30.0–36.0)
MCV: 96.8 fL (ref 80.0–100.0)
Monocytes Absolute: 0.3 10*3/uL (ref 0.1–1.0)
Monocytes Relative: 9 %
Neutro Abs: 2.2 10*3/uL (ref 1.7–7.7)
Neutrophils Relative %: 66 %
Platelets: 53 10*3/uL — ABNORMAL LOW (ref 150–400)
RBC: 3.09 MIL/uL — ABNORMAL LOW (ref 4.22–5.81)
RDW: 15.8 % — ABNORMAL HIGH (ref 11.5–15.5)
WBC: 3.4 10*3/uL — ABNORMAL LOW (ref 4.0–10.5)
nRBC: 0 % (ref 0.0–0.2)

## 2020-03-31 LAB — IMMATURE PLATELET FRACTION: Immature Platelet Fraction: 13.1 % — ABNORMAL HIGH (ref 1.2–8.6)

## 2020-04-01 NOTE — Progress Notes (Signed)
[facility]  [address], [address] [place], [place] [address] Phone: [phone]  Fax: [phone]   Clinic Day: 04/02/20  Referring physician: Danelle Berry, NP  Chief Complaint: Timothy [place] is a 75 y.o. male with hepatocellular carcinoma and thrombocytopenia who is seen for 7 week assessment per patient request.  HPI: The patient was last seen in the hematology clinic on 02/11/2020. At that time, he felt "fine".  Pain was rated as a 1 or 2.  Exam was unremarkable. He was to follow up after his MRI in 07/2020.  The patient saw Dr. Betti Cruz on 02/18/2020. He reported episodes of hypotension at home. BP at his visit was 119/61. He was not orthostatic. His losartan was decreased to 12.5 mg daily.  Labs on 03/31/2020 revealed a hematocrit of 29.9, hemoglobin 10.5, MCV 96.8, platelets 53,000, WBC 3,400 with an ANC of 2200. Calcium was 8.7. Bilirubin was 1.7. Immature platelet fraction was 13.1%.  During the interim, he has been good. He denies bleeding of any kind. He eats vegetables, fish, and lean meat. He denies any new medications or herbal products.  Per patient, his last colonoscopy and endoscopy were in 2019.  His insurance is switching on 04/04/2020. He will need a new hematologist and PCP in his network.   Past Medical History:  Diagnosis Date  . Arthritis    "lower back" (10/24/2012)  . Coronary artery disease   . GERD (gastroesophageal reflux disease)   . High cholesterol   . Hypertension   . Type II diabetes mellitus ([place])     Past Surgical History:  Procedure Laterality Date  . APPENDECTOMY  2002  . CARDIAC CATHETERIZATION  10/24/2012  . CORONARY ARTERY BYPASS GRAFT N/A 10/26/2012   Procedure: CORONARY ARTERY BYPASS GRAFTING (CABG);  Surgeon: Melrose Nakayama, MD;  Location: [facility];  Service: Open Heart Surgery;  Laterality: N/A;  Times 3 using left internal mammary artery and endoscopically harvested right saphenous  vein    Family History  Problem Relation Age of Onset  . Cancer Sister     Social History:  reports that he quit smoking about 18 years ago. His smoking use included cigarettes. He has a 15.00 pack-year smoking history. He has never used smokeless tobacco. He reports that he does not drink alcohol and does not use drugs. He stopped smoking and drinking in alcohol in 2003. Before that, he drank a couple of times per week. The patient's wife's name is [name]. He is accompanied by [name] today.  Allergies:  Allergies  Allergen Reactions  . Other Nausea And Vomiting  . Soap & Cleansers     "liquid soap" antibacterial -rash  . Latex Rash and Itching    Current Medications: Current Outpatient Medications  Medication Sig Dispense Refill  . aspirin EC 81 MG tablet Take 81 mg by mouth daily.    Marland Kitchen atorvastatin (LIPITOR) 40 MG tablet Take 1 tablet by mouth daily.    . fluocinonide cream (LIDEX) 0.05 %     . glimepiride (AMARYL) 1 MG tablet Take 1 mg by mouth daily.    Marland Kitchen glucosamine-chondroitin 500-400 MG tablet Take 1 tablet by mouth daily.    Marland Kitchen losartan (COZAAR) 25 MG tablet Take 25 mg by mouth daily.    . metoprolol tartrate (LOPRESSOR) 25 MG tablet Take 25 mg by mouth daily.    Marland Kitchen omeprazole (PRILOSEC) 20 MG capsule Take 40 mg by mouth every morning.    Marland Kitchen allopurinol (ZYLOPRIM) 100 MG  tablet Take 100 mg by mouth daily. (Patient not taking: No sig reported)  3  . furosemide (LASIX) 40 MG tablet Take 40 mg by mouth daily. (Patient not taking: No sig reported)    . ibuprofen (ADVIL,MOTRIN) 600 MG tablet Take 1 tablet (600 mg total) by mouth every 6 (six) hours as needed. (Patient not taking: Reported on 04/02/2020) 30 tablet 0  . nitroGLYCERIN (NITROSTAT) 0.3 MG SL tablet Place under the tongue. (Patient not taking: Reported on 04/02/2020)    . oxyCODONE-acetaminophen (PERCOCET) 5-325 MG tablet Take 1 tablet by mouth every 4 (four) hours as needed for severe pain. (Patient not taking: Reported on  04/02/2020) 20 tablet 0  . potassium chloride SA (K-DUR,KLOR-CON) 20 MEQ tablet Take 20 mEq by mouth daily. (Patient not taking: No sig reported)    . saxagliptin HCl (ONGLYZA) 2.5 MG TABS tablet Take 2.5 mg by mouth daily. (Patient not taking: No sig reported)    . zolpidem (AMBIEN) 10 MG tablet Take 10 mg by mouth at bedtime as needed for sleep. (Patient not taking: No sig reported)     No current facility-administered medications for this visit.    Review of Systems  Constitutional: Negative for chills, diaphoresis, fever, malaise/fatigue and weight loss (stable).  HENT: Negative for congestion, ear discharge, ear pain, hearing loss, nosebleeds, sinus pain, sore throat and tinnitus.   Eyes: Negative.  Negative for blurred vision, double vision and photophobia.  Respiratory: Negative.  Negative for cough, hemoptysis, sputum production and shortness of breath.   Cardiovascular: Negative.  Negative for chest pain, palpitations, orthopnea, claudication and leg swelling.  Gastrointestinal: Negative for abdominal pain, blood in stool, constipation, diarrhea, heartburn, melena, nausea and vomiting.  Genitourinary: Negative.  Negative for dysuria, flank pain, frequency, hematuria and urgency.  Musculoskeletal: Negative.  Negative for back pain, joint pain, myalgias and neck pain.  Skin: Negative.  Negative for itching and rash.  Neurological: Negative.  Negative for dizziness, tingling, tremors, sensory change, speech change, focal weakness, weakness and headaches.  Endo/Heme/Allergies: Does not bruise/bleed easily.       Diabetes.  Psychiatric/Behavioral: Negative.  Negative for depression and memory loss. The patient is not nervous/anxious and does not have insomnia.   All other systems reviewed and are negative.  Performance status (ECOG): 0  Vitals Blood pressure 123/60, pulse 67, temperature (!) 97.5 F (36.4 C), temperature source Tympanic, resp. rate 18, weight 205 lb 0.4 oz (93 kg),  SpO2 100 %.   Physical Exam Vitals and nursing note reviewed.  Constitutional:      General: He is not in acute distress.    Appearance: Normal appearance. He is not ill-appearing or toxic-appearing.  HENT:     Head: Normocephalic and atraumatic.     Mouth/Throat:     Mouth: Mucous membranes are moist.     Pharynx: Oropharynx is clear. No oropharyngeal exudate or posterior oropharyngeal erythema.  Eyes:     General: No scleral icterus.    Conjunctiva/sclera: Conjunctivae normal.     Pupils: Pupils are equal, round, and reactive to light.  Cardiovascular:     Rate and Rhythm: Normal rate and regular rhythm.  Pulmonary:     Effort: No respiratory distress.     Breath sounds: Normal breath sounds. No wheezing, rhonchi or rales.  Chest:  Breasts:     Right: No axillary adenopathy or supraclavicular adenopathy.     Left: No axillary adenopathy or supraclavicular adenopathy.    Abdominal:     General: Abdomen  is flat. There is no distension.     Palpations: There is no splenomegaly or mass.     Tenderness: There is no abdominal tenderness. There is no guarding or rebound.  Musculoskeletal:        General: No swelling or tenderness.     Right lower leg: No edema.     Left lower leg: No edema.  Lymphadenopathy:     Head:     Right side of head: No preauricular, posterior auricular or occipital adenopathy.     Left side of head: No preauricular, posterior auricular or occipital adenopathy.     Cervical: No cervical adenopathy.     Upper Body:     Right upper body: No supraclavicular or axillary adenopathy.     Left upper body: No supraclavicular or axillary adenopathy.     Lower Body: No right inguinal adenopathy. No left inguinal adenopathy.  Skin:    General: Skin is warm and dry.     Coloration: Skin is not jaundiced or pale.     Findings: No bruising, erythema or rash.  Neurological:     General: No focal deficit present.     Mental Status: He is alert and oriented to  person, place, and time. Mental status is at baseline.  Psychiatric:        Mood and Affect: Mood normal.        Behavior: Behavior normal.        Thought Content: Thought content normal.        Judgment: Judgment normal.    Appointment on 03/31/2020  Component Date Value Ref Range Status  . Immature Platelet Fraction 03/31/2020 13.1* 1.2 - 8.6 % Final   Comment:        An elevated IPF indicates increased platelet production. A low platelet count with an elevated IPF may be associated with peripheral platelet destruction (e.g. DIC, ITP) or bone marrow recovery (e.g. after chemotherapy or transplant). A low platelet count with a low or non- elevated IPF is consistent with a platelet production disorder. Performed at [facility], [address] [place], [place] [address]   . Sodium 03/31/2020 137  135 - 145 mmol/L Final  . Potassium 03/31/2020 3.7  3.5 - 5.1 mmol/L Final  . Chloride 03/31/2020 105  98 - 111 mmol/L Final  . CO2 03/31/2020 25  22 - 32 mmol/L Final  . Glucose, Bld 03/31/2020 147* 70 - 99 mg/dL Final   Glucose reference range applies only to samples taken after fasting for at least 8 hours.  . BUN 03/31/2020 13  8 - 23 mg/dL Final  . Creatinine, Ser 03/31/2020 1.11  0.61 - 1.24 mg/dL Final  . Calcium 03/31/2020 8.7* 8.9 - 10.3 mg/dL Final  . Total Protein 03/31/2020 6.9  6.5 - 8.1 g/dL Final  . Albumin 03/31/2020 4.1  3.5 - 5.0 g/dL Final  . AST 03/31/2020 25  15 - 41 U/L Final  . ALT 03/31/2020 38  0 - 44 U/L Final  . Alkaline Phosphatase 03/31/2020 58  38 - 126 U/L Final  . Total Bilirubin 03/31/2020 1.7* 0.3 - 1.2 mg/dL Final  . GFR, Estimated 03/31/2020 >60  >60 mL/min Final   Comment: (NOTE) Calculated using the CKD-EPI Creatinine Equation (2021)   . Anion gap 03/31/2020 7  5 - 15 Final   Performed at [facility], [address], [place], [place] [address]  . WBC 03/31/2020 3.4* 4.0 - 10.5 K/uL Final  . RBC  03/31/2020 3.09*  4.22 - 5.81 MIL/uL Final  . Hemoglobin 03/31/2020 10.5* 13.0 - 17.0 g/dL Final  . HCT 03/31/2020 29.9* 39.0 - 52.0 % Final  . MCV 03/31/2020 96.8  80.0 - 100.0 fL Final  . MCH 03/31/2020 34.0  26.0 - 34.0 pg Final  . MCHC 03/31/2020 35.1  30.0 - 36.0 g/dL Final  . RDW 03/31/2020 15.8* 11.5 - 15.5 % Final  . Platelets 03/31/2020 53* 150 - 400 K/uL Final  . nRBC 03/31/2020 0.0  0.0 - 0.2 % Final  . Neutrophils Relative % 03/31/2020 66  % Final  . Neutro Abs 03/31/2020 2.2  1.7 - 7.7 K/uL Final  . Lymphocytes Relative 03/31/2020 23  % Final  . Lymphs Abs 03/31/2020 0.8  0.7 - 4.0 K/uL Final  . Monocytes Relative 03/31/2020 9  % Final  . Monocytes Absolute 03/31/2020 0.3  0.1 - 1.0 K/uL Final  . Eosinophils Relative 03/31/2020 1  % Final  . Eosinophils Absolute 03/31/2020 0.0  0.0 - 0.5 K/uL Final  . Basophils Relative 03/31/2020 0  % Final  . Basophils Absolute 03/31/2020 0.0  0.0 - 0.1 K/uL Final  . Immature Granulocytes 03/31/2020 1  % Final  . Abs Immature Granulocytes 03/31/2020 0.04  0.00 - 0.07 K/uL Final   Performed at [facility], [address], [place], [place] [address]    Assessment:  Timothy [place] is a 75 y.o. male  with mild thrombocytopenia and clinical stage T2N0 hepatocellular carcinoma s/p proton therapy. Platele count has ranged between 94,000 - 126,000 since 06/2011. He denies any new medications or herbal products.  Work-upon 12/07/2016 revealed a hematocrit 45.1, hemoglobin 15.7, MCV 96.6, platelets 114,000, WBC 6200 with an ANC of 3500. Differential was unremarkable. Peripheral smearrevealed platelets of various sizes, some large body platelets. Normal studiesincluded: PT, PTT, SPEP, free light chains, H pylori antibody, hepatitis B core antibody, hepatitis C antibody, HIV testing, ANA, B12, and folate.   He has hepatocellular carcinoma s/p liver FNA on 04/20/2019.  Pathology revealed moderately differentiated (grade 2) hepatocellular  carcinoma.  Clinical stage was T2N0 lesion involving both hepatic segments VIII and V.  He declined surgery.  Child-Pugh class A.  Hepatitis B core antibody, hepatitis B surface antigen, and hepatitis C antibody were non reactive on 12/10/2019.    PET scan on 04/30/2019 revealed a 4.5 cm subtle lesion in the right lobe of the liver without increased FDG uptake. There was no focal uptake elsewhere to suggest metastatic disease or other malignancy.   He completed a course of proton beam radiation. He received 70 CGE in 25 fractions.  Radiation completed on 06/25/2019.  AFP has been followed:  5.6 on 10/26/2019 and 3.7 on 12/10/2019.  Abdominal MRI on 10/26/2019 at [facility] in [place] revealed decreased size of the segment VIII mass c/w hepatocellular carcinoma which demonstrated viable tumor with thick rim of enhancement (previously 6 x 5.4 x 4.8 cm, now 5 x 4.3 x 4.4 cm). There were no new suspicious hepatic lesions. There was cirrhosis with portal hypertension including splenomegaly (13.5 cm) and small upper abdominal collaterals.  Symptomatically,  he feels good. He denies bleeding of any kind. He eats vegetables, fish, and lean meat. He denies any new medications or herbal products.  Exam is stable.  Plan: 1.   Review labs from 03/31/2020. 2.   Clinical stage T2N0 hepatocellular carcinoma             Patient was diagnosed incidentally on  follow-up of an aortic stent.             Pathology on 04/20/2019 confirmed grade 2 hepatocellular carcinoma.             Patient was offered proton beam therapy versus surgery.  He elected to pursue radiation.                         He completed radiation on 06/25/2019.             Abdominal MRI on 10/26/2019 revealed decrease in size of the segment VIII mass c/w hepatocellular carcinoma.                         There was a thick rim of enhancement.  There are no new suspicious lesions.             Review plan for abdominal MRI  and AFP every 6 months.   Anticipate next imaging on 07/30/2020.  Review plan for chest CT yearly.   Anticipate next imaging on 12/31/2020.  Discuss need for ongoing surveillance. 3.   Thrombocytopenia             Platelet count is 49,000 today.    Platelet count ranges between 61,000 - 65,000.               Patient has a history of mild thrombocytopenia felt secondary to ITP.   IPF was 13.1% on 03/31/2020 and felt c/w ITP.  Hepatitis B and C serologies were negative on 12/10/2019.  Discuss treatment options for ITP.  Additional labs today.  Discuss consideration of bone marrow aspirate and biopsy.   Procedure discussed in detail including risks and benefits. 4.   Normocytic anemia  Patient denies any bleeding. Diet appears good.  Discuss additional work-up today: Coombs, LDH, TSH, B12, folate, ferritin, iron studies, retic.  Peripheral smear for pathologic review today.  Consider bone marrow as above secondary to mild pancytopenia. 5.   Abdominal pain  Patient has intermittent abdominal pain.  RUQ ultrasound on 12/25/2019 revealed cholelithiasis and sludge within the gallbladder with gallbladder wall thickening.   Imaging suggests symptomatic cholelithiasis.   Patient has changed his diet.  Colonoscopy and endoscopy in 2019 were negative per patient.. 6.   RN:  Call patient with results. 7.   RTC prn (patient has insurance change; we are out of network).  I discussed the assessment and treatment plan with the patient.  The patient was provided an opportunity to ask questions and all were answered.  The patient agreed with the plan and demonstrated an understanding of the instructions.  The patient was advised to call back if the symptoms worsen or if the condition fails to improve as anticipated.  I provided 24 minutes of face-to-face time during this this encounter and > 50% was spent counseling as documented under my assessment and plan.  An additional 10+ minutes were spent  reviewing his chart (Epic and Care Everywhere) including notes, labs, and imaging studies.    Lequita Asal, MD, PhD 04/02/2020, 1:32 PM   I, De Burrs, am acting as Education administrator for [employer]. Mike Gip, MD, PhD.  I, Demarquis Osley C. Mike Gip, MD, have reviewed the above documentation for accuracy and completeness, and I agree with the above.

## 2020-04-02 ENCOUNTER — Other Ambulatory Visit: Payer: Self-pay

## 2020-04-02 ENCOUNTER — Encounter: Payer: Self-pay | Admitting: Hematology and Oncology

## 2020-04-02 ENCOUNTER — Inpatient Hospital Stay: Payer: Medicare HMO

## 2020-04-02 ENCOUNTER — Inpatient Hospital Stay: Payer: Medicare HMO | Admitting: Hematology and Oncology

## 2020-04-02 VITALS — BP 123/60 | HR 67 | Temp 97.5°F | Resp 18 | Wt 205.0 lb

## 2020-04-02 DIAGNOSIS — K766 Portal hypertension: Secondary | ICD-10-CM | POA: Diagnosis not present

## 2020-04-02 DIAGNOSIS — R161 Splenomegaly, not elsewhere classified: Secondary | ICD-10-CM | POA: Diagnosis not present

## 2020-04-02 DIAGNOSIS — K7460 Unspecified cirrhosis of liver: Secondary | ICD-10-CM | POA: Diagnosis not present

## 2020-04-02 DIAGNOSIS — E119 Type 2 diabetes mellitus without complications: Secondary | ICD-10-CM | POA: Diagnosis not present

## 2020-04-02 DIAGNOSIS — C220 Liver cell carcinoma: Secondary | ICD-10-CM

## 2020-04-02 DIAGNOSIS — D696 Thrombocytopenia, unspecified: Secondary | ICD-10-CM

## 2020-04-02 DIAGNOSIS — D649 Anemia, unspecified: Secondary | ICD-10-CM

## 2020-04-02 DIAGNOSIS — Z923 Personal history of irradiation: Secondary | ICD-10-CM | POA: Diagnosis not present

## 2020-04-03 ENCOUNTER — Inpatient Hospital Stay: Payer: Medicare HMO

## 2020-04-03 DIAGNOSIS — E119 Type 2 diabetes mellitus without complications: Secondary | ICD-10-CM | POA: Diagnosis not present

## 2020-04-03 DIAGNOSIS — D649 Anemia, unspecified: Secondary | ICD-10-CM | POA: Diagnosis not present

## 2020-04-03 DIAGNOSIS — C220 Liver cell carcinoma: Secondary | ICD-10-CM | POA: Diagnosis not present

## 2020-04-03 DIAGNOSIS — D696 Thrombocytopenia, unspecified: Secondary | ICD-10-CM | POA: Diagnosis not present

## 2020-04-03 DIAGNOSIS — R072 Precordial pain: Secondary | ICD-10-CM | POA: Diagnosis not present

## 2020-04-03 DIAGNOSIS — Z923 Personal history of irradiation: Secondary | ICD-10-CM | POA: Diagnosis not present

## 2020-04-03 DIAGNOSIS — K766 Portal hypertension: Secondary | ICD-10-CM | POA: Diagnosis not present

## 2020-04-03 DIAGNOSIS — K7460 Unspecified cirrhosis of liver: Secondary | ICD-10-CM | POA: Diagnosis not present

## 2020-04-03 DIAGNOSIS — R161 Splenomegaly, not elsewhere classified: Secondary | ICD-10-CM | POA: Diagnosis not present

## 2020-04-03 LAB — CBC WITH DIFFERENTIAL/PLATELET
Abs Immature Granulocytes: 0.05 10*3/uL (ref 0.00–0.07)
Basophils Absolute: 0 10*3/uL (ref 0.0–0.1)
Basophils Relative: 0 %
Eosinophils Absolute: 0 10*3/uL (ref 0.0–0.5)
Eosinophils Relative: 1 %
HCT: 29.6 % — ABNORMAL LOW (ref 39.0–52.0)
Hemoglobin: 10.6 g/dL — ABNORMAL LOW (ref 13.0–17.0)
Immature Granulocytes: 2 %
Lymphocytes Relative: 22 %
Lymphs Abs: 0.7 10*3/uL (ref 0.7–4.0)
MCH: 34.5 pg — ABNORMAL HIGH (ref 26.0–34.0)
MCHC: 35.8 g/dL (ref 30.0–36.0)
MCV: 96.4 fL (ref 80.0–100.0)
Monocytes Absolute: 0.2 10*3/uL (ref 0.1–1.0)
Monocytes Relative: 6 %
Neutro Abs: 2.2 10*3/uL (ref 1.7–7.7)
Neutrophils Relative %: 69 %
Platelets: 49 10*3/uL — ABNORMAL LOW (ref 150–400)
RBC: 3.07 MIL/uL — ABNORMAL LOW (ref 4.22–5.81)
RDW: 15.8 % — ABNORMAL HIGH (ref 11.5–15.5)
WBC: 3.2 10*3/uL — ABNORMAL LOW (ref 4.0–10.5)
nRBC: 0 % (ref 0.0–0.2)

## 2020-04-03 LAB — FERRITIN: Ferritin: 474 ng/mL — ABNORMAL HIGH (ref 24–336)

## 2020-04-03 LAB — RETICULOCYTES
Immature Retic Fract: 23.5 % — ABNORMAL HIGH (ref 2.3–15.9)
RBC.: 3.08 MIL/uL — ABNORMAL LOW (ref 4.22–5.81)
Retic Count, Absolute: 65.9 10*3/uL (ref 19.0–186.0)
Retic Ct Pct: 2.1 % (ref 0.4–3.1)

## 2020-04-03 LAB — TSH: TSH: 0.673 u[IU]/mL (ref 0.350–4.500)

## 2020-04-03 LAB — IRON AND TIBC
Iron: 208 ug/dL — ABNORMAL HIGH (ref 45–182)
Saturation Ratios: 67 % — ABNORMAL HIGH (ref 17.9–39.5)
TIBC: 311 ug/dL (ref 250–450)
UIBC: 103 ug/dL

## 2020-04-03 LAB — DAT, POLYSPECIFIC AHG (ARMC ONLY): Polyspecific AHG test: NEGATIVE

## 2020-04-03 LAB — VITAMIN B12: Vitamin B-12: 390 pg/mL (ref 180–914)

## 2020-04-03 LAB — LACTATE DEHYDROGENASE: LDH: 164 U/L (ref 98–192)

## 2020-04-03 LAB — PATHOLOGIST SMEAR REVIEW

## 2020-04-03 LAB — FOLATE: Folate: 11.3 ng/mL (ref >5.9)

## 2020-04-08 ENCOUNTER — Telehealth: Payer: Self-pay

## 2020-04-08 NOTE — Telephone Encounter (Signed)
Per secure chat from Dr Mike Gip: Please call patient. Extensive work-up for pancytopenia revealed no etiology. Consider bone marrow. Patient had an elevated ferritin and iron saturation. Has he ever been tested for hemochromatosis?  patient is questioning about [place] not being in his network for insurance. he does think he has ever been tested for hemochromatosis. He will check with insurance before proceeding with bone marrow.

## 2020-04-14 ENCOUNTER — Other Ambulatory Visit: Payer: Self-pay | Admitting: Hematology and Oncology

## 2020-04-14 ENCOUNTER — Telehealth: Payer: Self-pay | Admitting: Hematology and Oncology

## 2020-04-14 ENCOUNTER — Telehealth: Payer: Self-pay | Admitting: *Deleted

## 2020-04-14 DIAGNOSIS — D696 Thrombocytopenia, unspecified: Secondary | ICD-10-CM

## 2020-04-14 DIAGNOSIS — D649 Anemia, unspecified: Secondary | ICD-10-CM

## 2020-04-14 NOTE — Telephone Encounter (Signed)
Please see phone note from 04/08/20. Pt is requesting to go ahead and have the bone marrow biopsy scheduled. He spoke to his insurance and is okay to have it.

## 2020-04-14 NOTE — Telephone Encounter (Signed)
Called patient to verify that his insurance would cover his bone marrow biopsy test done. Patient stated that his insurance would cover the procedure.

## 2020-04-14 NOTE — Telephone Encounter (Signed)
Form faxed to scheduling, patient will need f/u scheduled 10 days after procedure once scheduled. Thanks.

## 2020-04-15 ENCOUNTER — Other Ambulatory Visit: Payer: Self-pay

## 2020-04-15 ENCOUNTER — Telehealth: Payer: Self-pay | Admitting: Gastroenterology

## 2020-04-15 DIAGNOSIS — R1013 Epigastric pain: Secondary | ICD-10-CM

## 2020-04-15 NOTE — Telephone Encounter (Signed)
Pt returned call and was scheduled for an EGD with Dr. Allen Norris at [facility] on 04/18/20

## 2020-04-15 NOTE — Telephone Encounter (Signed)
Patient called asking for call back to reschedule EGD

## 2020-04-15 NOTE — Telephone Encounter (Signed)
Left vm to reschedule EGD with Dr. Allen Norris.

## 2020-04-16 ENCOUNTER — Other Ambulatory Visit: Payer: Self-pay

## 2020-04-16 ENCOUNTER — Observation Stay
Admission: EM | Admit: 2020-04-16 | Discharge: 2020-04-17 | Disposition: A | Discharge status: Home / Self Care | Payer: Medicare HMO | Attending: Family Medicine | Admitting: Family Medicine

## 2020-04-16 ENCOUNTER — Emergency Department: Payer: Medicare HMO

## 2020-04-16 DIAGNOSIS — R109 Unspecified abdominal pain: Secondary | ICD-10-CM | POA: Diagnosis not present

## 2020-04-16 DIAGNOSIS — E119 Type 2 diabetes mellitus without complications: Secondary | ICD-10-CM | POA: Insufficient documentation

## 2020-04-16 DIAGNOSIS — K8050 Calculus of bile duct without cholangitis or cholecystitis without obstruction: Principal | ICD-10-CM | POA: Insufficient documentation

## 2020-04-16 DIAGNOSIS — Z7982 Long term (current) use of aspirin: Secondary | ICD-10-CM | POA: Diagnosis not present

## 2020-04-16 DIAGNOSIS — Z7984 Long term (current) use of oral hypoglycemic drugs: Secondary | ICD-10-CM | POA: Insufficient documentation

## 2020-04-16 DIAGNOSIS — Z8505 Personal history of malignant neoplasm of liver: Secondary | ICD-10-CM | POA: Diagnosis not present

## 2020-04-16 DIAGNOSIS — R1012 Left upper quadrant pain: Secondary | ICD-10-CM | POA: Diagnosis present

## 2020-04-16 DIAGNOSIS — Z951 Presence of aortocoronary bypass graft: Secondary | ICD-10-CM | POA: Diagnosis not present

## 2020-04-16 DIAGNOSIS — Z87891 Personal history of nicotine dependence: Secondary | ICD-10-CM | POA: Insufficient documentation

## 2020-04-16 DIAGNOSIS — R2689 Other abnormalities of gait and mobility: Secondary | ICD-10-CM | POA: Insufficient documentation

## 2020-04-16 DIAGNOSIS — Z91040 Latex allergy status: Secondary | ICD-10-CM | POA: Insufficient documentation

## 2020-04-16 DIAGNOSIS — Z20822 Contact with and (suspected) exposure to covid-19: Secondary | ICD-10-CM | POA: Diagnosis not present

## 2020-04-16 DIAGNOSIS — I2510 Atherosclerotic heart disease of native coronary artery without angina pectoris: Secondary | ICD-10-CM | POA: Diagnosis not present

## 2020-04-16 DIAGNOSIS — I10 Essential (primary) hypertension: Secondary | ICD-10-CM | POA: Diagnosis not present

## 2020-04-16 DIAGNOSIS — Z79899 Other long term (current) drug therapy: Secondary | ICD-10-CM | POA: Insufficient documentation

## 2020-04-16 DIAGNOSIS — K851 Biliary acute pancreatitis without necrosis or infection: Secondary | ICD-10-CM

## 2020-04-16 HISTORY — DX: Malignant neoplasm of liver, not specified as primary or secondary: C22.9

## 2020-04-16 LAB — HEPATIC FUNCTION PANEL
ALT: 89 U/L — ABNORMAL HIGH (ref 0–44)
AST: 78 U/L — ABNORMAL HIGH (ref 15–41)
Albumin: 3.9 g/dL (ref 3.5–5.0)
Alkaline Phosphatase: 78 U/L (ref 38–126)
Bilirubin, Direct: 4.5 mg/dL — ABNORMAL HIGH (ref 0.0–0.2)
Indirect Bilirubin: 2.7 mg/dL — ABNORMAL HIGH (ref 0.3–0.9)
Total Bilirubin: 7.2 mg/dL — ABNORMAL HIGH (ref 0.3–1.2)
Total Protein: 6.7 g/dL (ref 6.5–8.1)

## 2020-04-16 LAB — URINALYSIS, COMPLETE (UACMP) WITH MICROSCOPIC
Bacteria, UA: NONE SEEN
Bilirubin Urine: NEGATIVE
Glucose, UA: NEGATIVE mg/dL
Ketones, ur: NEGATIVE mg/dL
Leukocytes,Ua: NEGATIVE
Nitrite: NEGATIVE
Protein, ur: NEGATIVE mg/dL
Specific Gravity, Urine: 1.003 — ABNORMAL LOW (ref 1.005–1.030)
Squamous Epithelial / HPF: NONE SEEN (ref 0–5)
pH: 6 (ref 5.0–8.0)

## 2020-04-16 LAB — CBC
HCT: 24.9 % — ABNORMAL LOW (ref 39.0–52.0)
Hemoglobin: 9.1 g/dL — ABNORMAL LOW (ref 13.0–17.0)
MCH: 35 pg — ABNORMAL HIGH (ref 26.0–34.0)
MCHC: 36.5 g/dL — ABNORMAL HIGH (ref 30.0–36.0)
MCV: 95.8 fL (ref 80.0–100.0)
Platelets: 39 10*3/uL — ABNORMAL LOW (ref 150–400)
RBC: 2.6 MIL/uL — ABNORMAL LOW (ref 4.22–5.81)
RDW: 15.9 % — ABNORMAL HIGH (ref 11.5–15.5)
WBC: 3.2 10*3/uL — ABNORMAL LOW (ref 4.0–10.5)
nRBC: 0 % (ref 0.0–0.2)

## 2020-04-16 LAB — CBG MONITORING, ED: Glucose-Capillary: 74 mg/dL (ref 70–99)

## 2020-04-16 LAB — BASIC METABOLIC PANEL
Anion gap: 9 (ref 5–15)
BUN: 11 mg/dL (ref 8–23)
CO2: 24 mmol/L (ref 22–32)
Calcium: 8.5 mg/dL — ABNORMAL LOW (ref 8.9–10.3)
Chloride: 105 mmol/L (ref 98–111)
Creatinine, Ser: 0.95 mg/dL (ref 0.61–1.24)
GFR, Estimated: >60 mL/min (ref >60)
Glucose, Bld: 177 mg/dL — ABNORMAL HIGH (ref 70–99)
Potassium: 3.2 mmol/L — ABNORMAL LOW (ref 3.5–5.1)
Sodium: 138 mmol/L (ref 135–145)

## 2020-04-16 LAB — LIPASE, BLOOD: Lipase: 6185 U/L — ABNORMAL HIGH (ref 11–51)

## 2020-04-16 IMAGING — CT CT ABD-PELV W/ CM
2 of 5 series · 15 of 46 positions shown, 17 images · IV contrast (APPLIED)
Comparison: [DATE]

CLINICAL DATA: Left upper quadrant abdominal pain. History of liver
carcinoma.

EXAM:
CT ABDOMEN AND PELVIS WITH CONTRAST
TECHNIQUE: Multidetector CT imaging of the abdomen and pelvis was performed
using the standard protocol following bolus administration of
intravenous contrast.
CONTRAST:  100mL OMNIPAQUE IOHEXOL 300 MG/ML  SOLN

[Series 2: axial st · axial · 0.87mm/px · z∈[-159,+291]mm · 12 of 101 slices shown, 14 images]
[im 6/101  soft-tissue]
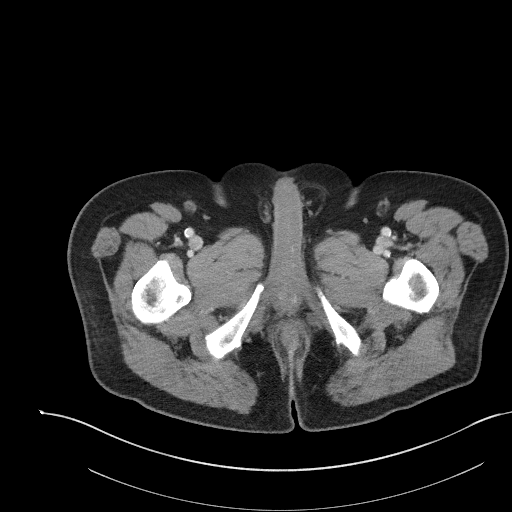
[im 6/101  bone]
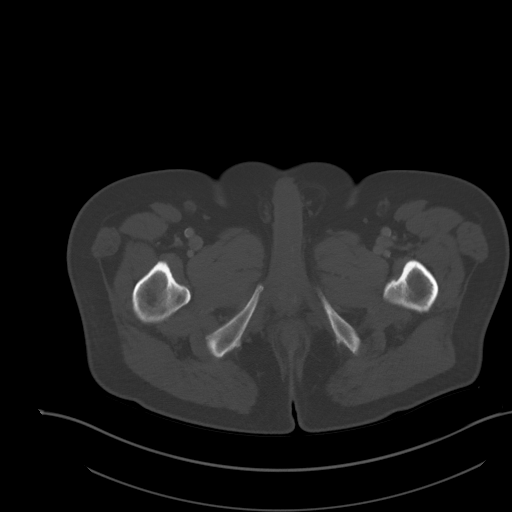
[im 16/101  soft-tissue]
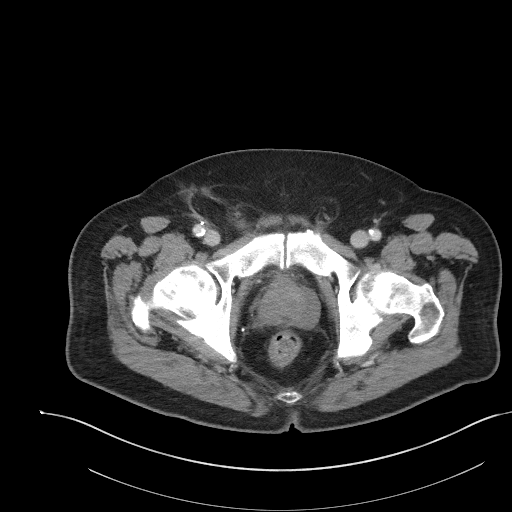
[im 21/101  soft-tissue]
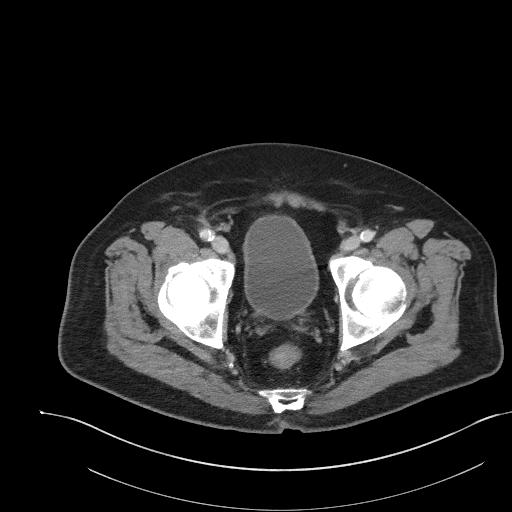
[im 31/101  soft-tissue]
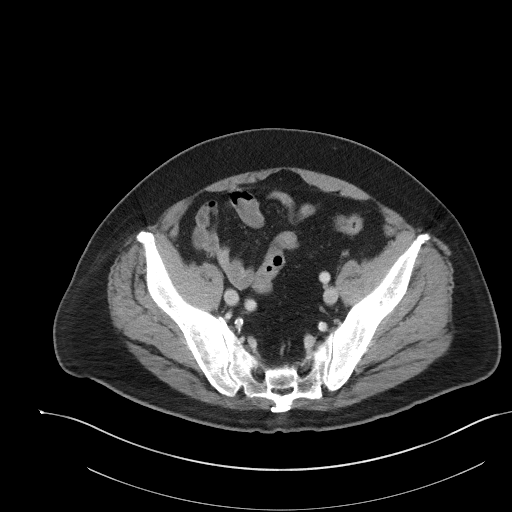
[im 41/101  soft-tissue]
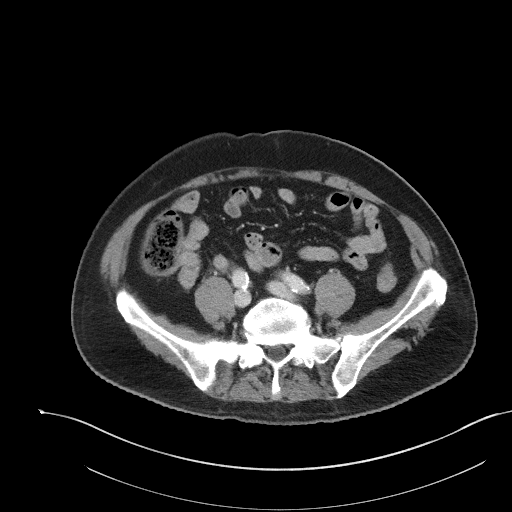
[im 46/101  soft-tissue]
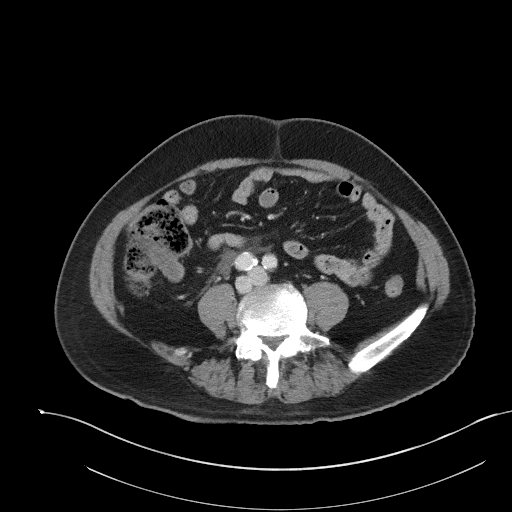
[im 56/101  soft-tissue]
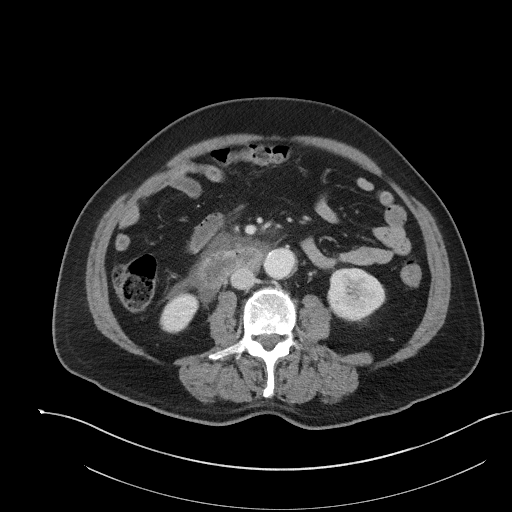
[im 61/101  soft-tissue]
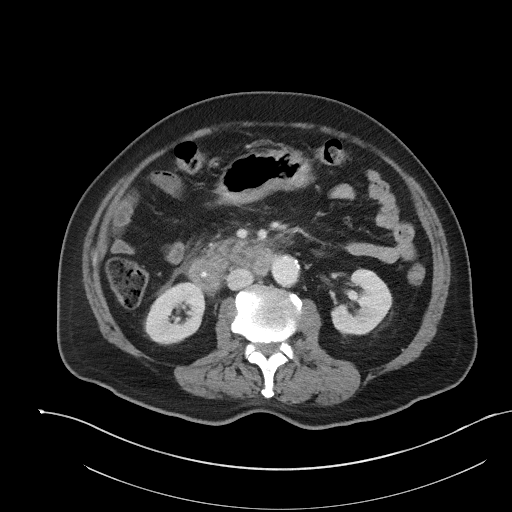
[im 71/101  soft-tissue]
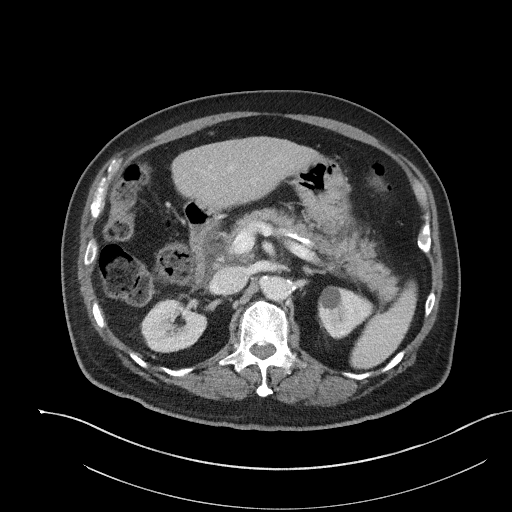
[im 71/101  bone]
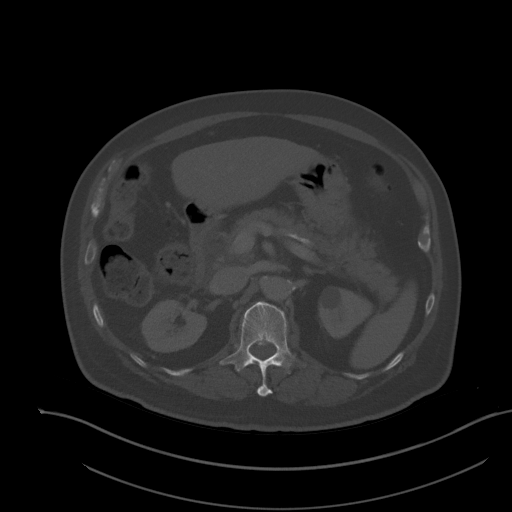
[im 81/101  soft-tissue]
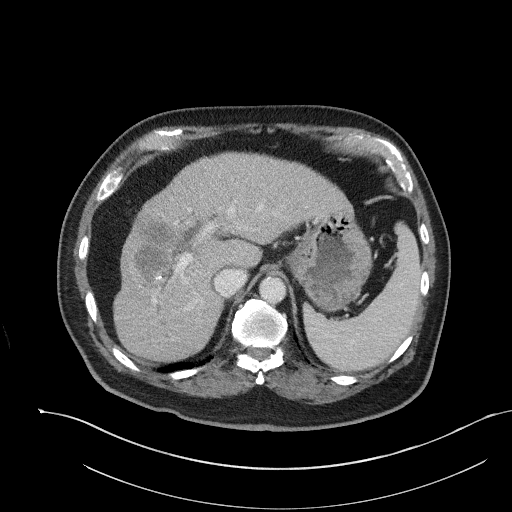
[im 86/101  soft-tissue]
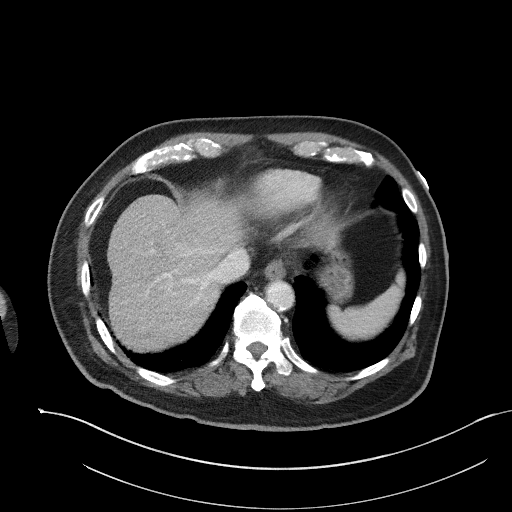
[im 96/101  soft-tissue]
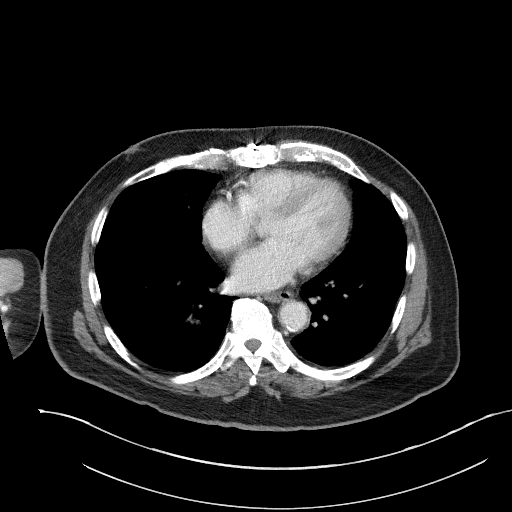

[Series 5: coronal st · coronal · 0.83mm/px · 3 of 94 slices shown]
[im 32/94  soft-tissue]
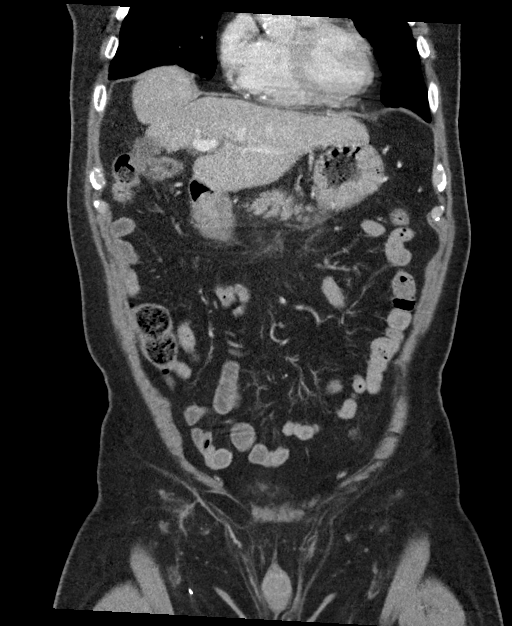
[im 42/94  soft-tissue]
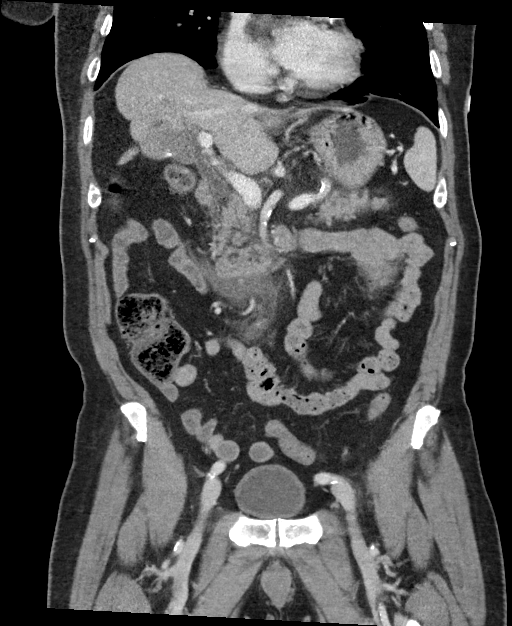
[im 52/94  soft-tissue]
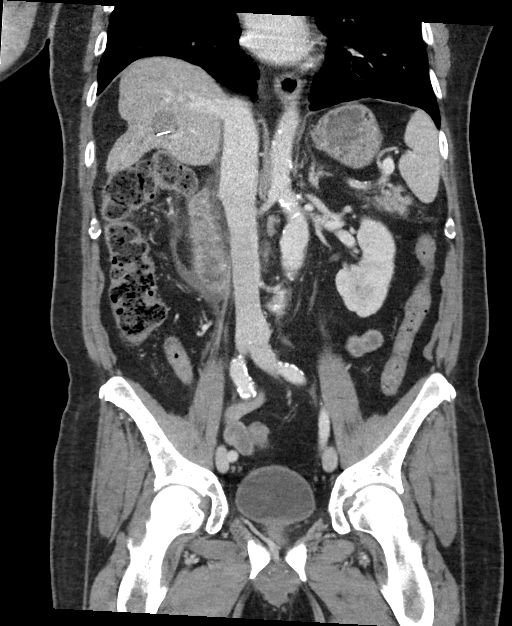

[15 of 46 positions shown; findings below may reference images not displayed]

FINDINGS: Lower chest: No acute abnormality.

Hepatobiliary: Well-defined hypo attenuate mass in segment 5, 3.9 x
3.1 x 3.3 cm, smaller than on the prior CT. Liver shows morphologic
changes consistent with cirrhosis with central volume loss and
enlargement of the lateral segment of the left lobe and caudate lobe
and mild surface nodularity. No other liver masses or lesions.

There are gallstones, but no gallbladder wall thickening or adjacent
inflammation. Small stone projects in the distal common bile duct
near the ampulla. Common bile duct is dilated to 8.5 mm.

Pancreas: Hazy inflammatory change extends along the margins of the
pancreas, most evident adjacent to the pancreatic head and tail, but
also tracking along the duodenum with fluid attenuation noted along
the anterior pararenal fascia, greater on the right. No pancreatic
mass or significant duct dilation. Homogeneous pancreatic
enhancement is noted.

Spleen: Prominent spleen measuring 14 cm in greatest dimension. No
splenic mass or focal lesion.

Adrenals/Urinary Tract: No adrenal masses. Kidneys are normal in
size, orientation and position with symmetric enhancement and
excretion. 1.8 cm cyst, upper pole of the left kidney. Tiny adjacent
low-density lesion likely also a cyst. No other renal masses or
lesions. No stones. No hydronephrosis. Ureters normal in course and
in caliber. Bladder is unremarkable.

Stomach/Bowel: Normal stomach. Fluid attenuation/inflammatory
changes track along the duodenum. Two small stones project in the
second portion of the duodenum adjacent to the anterior L.

Small bowel and colon are normal in caliber. No wall thickening. No
inflammation.

Vascular/Lymphatic: Aortic atherosclerosis. No evidence of
thrombosis of the portal vein, splenic vein or superior mesenteric
vein. No enlarged lymph nodes.

Reproductive: Mild prostate enlargement transverse, dimension.
cm in greatest

Other: None.

Musculoskeletal: No fracture or acute finding.  No bone lesion.
IMPRESSION: 1. Acute uncomplicated pancreatitis. Specifically, no evidence of
pancreatic necrosis or of an abscess/pseudocyst. No venous
thrombosis. Etiology appears due to passage of a gallstone.
2. Choledocholithiasis. Small gallstone projects in the distal
common bile duct near the ampulla Vater with two other small stones
noted in the adjacent second portion of the duodenum. Mild dilation
of the common bile duct. There are several stones within the
gallbladder.
3. 3.9 cm hypoattenuating, rim enhancing liver mass, smaller than on
the prior CT, presumably the residua of treated liver carcinoma.
4. Cirrhosis, appears stable from the prior CT.
5. Aortic atherosclerosis.

## 2020-04-16 MED ORDER — IOHEXOL 300 MG/ML  SOLN
100.0000 mL | Freq: Once | INTRAMUSCULAR | Status: AC | PRN
  Administered 2020-04-16: 100 mL via INTRAVENOUS
  Filled 2020-04-16: qty 100

## 2020-04-16 MED ORDER — PANTOPRAZOLE SODIUM 40 MG PO TBEC
40.0000 mg | DELAYED_RELEASE_TABLET | Freq: Every day | ORAL | Status: DC
  Administered 2020-04-17: 40 mg via ORAL
  Filled 2020-04-16: qty 1

## 2020-04-16 MED ORDER — HYDROMORPHONE HCL 1 MG/ML IJ SOLN
0.5000 mg | Freq: Once | INTRAMUSCULAR | Status: AC
  Administered 2020-04-16: 0.5 mg via INTRAVENOUS
  Filled 2020-04-16: qty 1

## 2020-04-16 MED ORDER — SENNOSIDES-DOCUSATE SODIUM 8.6-50 MG PO TABS
1.0000 | ORAL_TABLET | Freq: Every day | ORAL | Status: DC
  Administered 2020-04-16: 1 via ORAL
  Filled 2020-04-16: qty 1

## 2020-04-16 MED ORDER — POTASSIUM CHLORIDE 2 MEQ/ML IV SOLN
INTRAVENOUS | Status: DC
  Filled 2020-04-16 (×2): qty 1000

## 2020-04-16 MED ORDER — ONDANSETRON HCL 4 MG/2ML IJ SOLN
4.0000 mg | Freq: Once | INTRAMUSCULAR | Status: AC
  Administered 2020-04-16: 4 mg via INTRAVENOUS
  Filled 2020-04-16: qty 2

## 2020-04-16 MED ORDER — MORPHINE SULFATE (PF) 4 MG/ML IV SOLN
4.0000 mg | Freq: Once | INTRAVENOUS | Status: AC
  Administered 2020-04-16: 4 mg via INTRAVENOUS
  Filled 2020-04-16: qty 1

## 2020-04-16 MED ORDER — INSULIN ASPART 100 UNIT/ML ~~LOC~~ SOLN: 0.0000 [IU] | SUBCUTANEOUS | Status: DC

## 2020-04-16 MED ORDER — OXYCODONE HCL 5 MG PO TABS: 5.0000 mg | ORAL_TABLET | ORAL | Status: DC | PRN

## 2020-04-16 MED ORDER — LACTULOSE 10 GM/15ML PO SOLN: 10.0000 g | Freq: Every day | ORAL | Status: DC

## 2020-04-16 MED ORDER — ONDANSETRON HCL 4 MG/2ML IJ SOLN: 4.0000 mg | Freq: Four times a day (QID) | INTRAMUSCULAR | Status: DC | PRN

## 2020-04-16 MED ORDER — METOPROLOL TARTRATE 25 MG PO TABS
12.5000 mg | ORAL_TABLET | Freq: Two times a day (BID) | ORAL | Status: DC
  Administered 2020-04-17: 12.5 mg via ORAL
  Filled 2020-04-16: qty 1

## 2020-04-16 MED ORDER — HYDROMORPHONE HCL 1 MG/ML IJ SOLN
0.5000 mg | INTRAMUSCULAR | Status: DC | PRN
  Administered 2020-04-17: 0.5 mg via INTRAVENOUS
  Filled 2020-04-16: qty 1

## 2020-04-16 NOTE — ED Provider Notes (Signed)
[facility] Emergency Department Provider Note  ____________________________________________  Time seen: Approximately 3:39 PM  I have reviewed the triage vital signs and the nursing notes.   HISTORY  Chief Complaint Flank Pain    HPI Timothy Murray is a 75 y.o. male who presents the emergency department complaining of left upper abdominal pain radiating to the back.   Patient states that he has had worsening left upper quadrant abdominal pain.  Patient states that he has a history of liver cancer and his oncologist and GI doctor want to perform an endoscopy.  Patient states that the pain has been progressively worsening and he cannot wait for the endoscopy.  He has had some nausea but no frank emesis.  He did have some diarrhea yesterday but otherwise has been having normal stools.  He is seen no blood in the stools.  Patient denies any fevers or chills, URI symptoms.  Patient states that the abdominal pain hurts if he takes in a deep breath but he has no shortness of breath, no chest pain and the pain that worsens is not in his chest.  Again patient has a history of hepatocellular carcinoma, thrombocytopenia, hypertension, diabetes, coronary artery disease.        Past Medical History:  Diagnosis Date  . Arthritis    "lower back" (10/24/2012)  . Coronary artery disease   . GERD (gastroesophageal reflux disease)   . High cholesterol   . Hypertension   . Type II diabetes mellitus [facility])     Patient Active Problem List   Diagnosis Date Noted  . Choledocholithiasis 04/16/2020  . Hepatocellular carcinoma ([place]) 12/26/2019  . Thrombocytopenia ([place]) 12/16/2016  . HTN (hypertension) 10/29/2012  . Diabetes ([place]) 10/29/2012  . Other and unspecified hyperlipidemia 10/29/2012  . CAD (coronary artery disease), native coronary artery 10/29/2012  . S/P CABG x 3 10/29/2012    Past Surgical History:  Procedure Laterality Date  . APPENDECTOMY  2002  . CARDIAC  CATHETERIZATION  10/24/2012  . CORONARY ARTERY BYPASS GRAFT N/A 10/26/2012   Procedure: CORONARY ARTERY BYPASS GRAFTING (CABG);  Surgeon: Melrose Nakayama, MD;  Location: [place];  Service: Open Heart Surgery;  Laterality: N/A;  Times 3 using left internal mammary artery and endoscopically harvested right saphenous vein    Prior to Admission medications   Medication Sig Start Date End Date Taking? Authorizing Provider  allopurinol (ZYLOPRIM) 100 MG tablet Take 100 mg by mouth daily. Patient not taking: No sig reported 11/26/16   [provider]  aspirin EC 81 MG tablet Take 81 mg by mouth daily.    [provider]  atorvastatin (LIPITOR) 40 MG tablet Take 1 tablet by mouth daily. 11/21/19   [provider]  fluocinonide cream (LIDEX) 0.05 %  11/17/12   [provider]  furosemide (LASIX) 40 MG tablet Take 40 mg by mouth daily. Patient not taking: No sig reported    [provider]  glimepiride (AMARYL) 1 MG tablet Take 1 mg by mouth daily.    [provider]  glucosamine-chondroitin 500-400 MG tablet Take 1 tablet by mouth daily.    [provider]  ibuprofen (ADVIL,MOTRIN) 600 MG tablet Take 1 tablet (600 mg total) by mouth every 6 (six) hours as needed. Patient not taking: Reported on 04/02/2020 06/09/15   Arlyss Repress, PA-C  losartan (COZAAR) 25 MG tablet Take 25 mg by mouth daily.    [provider]  metoprolol tartrate (LOPRESSOR) 25 MG tablet Take 25  mg by mouth daily.    [provider]  nitroGLYCERIN (NITROSTAT) 0.3 MG SL tablet Place under the tongue. Patient not taking: Reported on 04/02/2020 02/18/20   [provider]  omeprazole (PRILOSEC) 20 MG capsule Take 40 mg by mouth every morning.    [provider]  oxyCODONE-acetaminophen (PERCOCET) 5-325 MG tablet Take 1 tablet by mouth every 4 (four) hours as needed for severe pain. Patient not taking: Reported on 04/02/2020 01/01/20  12/31/20  Lavonia Drafts, MD  potassium chloride SA (K-DUR,KLOR-CON) 20 MEQ tablet Take 20 mEq by mouth daily. Patient not taking: No sig reported    [provider]  saxagliptin HCl (ONGLYZA) 2.5 MG TABS tablet Take 2.5 mg by mouth daily. Patient not taking: No sig reported    [provider]  zolpidem (AMBIEN) 10 MG tablet Take 10 mg by mouth at bedtime as needed for sleep. Patient not taking: No sig reported    [provider]    Allergies Other, Soap & cleansers, and Latex  Family History  Problem Relation Age of Onset  . Cancer Sister     Social History Social History   Tobacco Use  . Smoking status: Former Smoker    Packs/day: 0.50    Years: 30.00    Pack years: 15.00    Types: Cigarettes    Quit date: 08/28/2001    Years since quitting: 18.6  . Smokeless tobacco: Never Used  Substance Use Topics  . Alcohol use: No    Alcohol/week: 0.0 standard drinks    Comment: 10/24/2012 "stopped drinking alcohol in 2003; used to drink ~ 9 bottles beer/wk"  . Drug use: No     Review of Systems  Constitutional: No fever/chills Eyes: No visual changes. No discharge ENT: No upper respiratory complaints. Cardiovascular: no chest pain. Respiratory: no cough. No SOB. Gastrointestinal: Left upper quadrant abdominal pain.  Positive nausea, no vomiting.  Diarrhea yesterday, returned to normal stool today.  No constipation. Musculoskeletal: Negative for musculoskeletal pain. Skin: Negative for rash, abrasions, lacerations, ecchymosis. Neurological: Negative for headaches, focal weakness or numbness.  10 System ROS otherwise negative.  ____________________________________________   PHYSICAL EXAM:  VITAL SIGNS: ED Triage Vitals  Enc Vitals Group     BP 04/16/20 1413 138/63     Pulse Rate 04/16/20 1413 90     Resp 04/16/20 1413 20     Temp 04/16/20 1413 98.9 F (37.2 C)     Temp Source 04/16/20 1413 Oral     SpO2 04/16/20 1413 97 %     Weight  04/16/20 1414 205 lb (93 kg)     Height 04/16/20 1414 6' (1.829 m)     Head Circumference --      Peak Flow --      Pain Score 04/16/20 1423 7     Pain Loc --      Pain Edu? --      Excl. in [place]? --      Constitutional: Alert and oriented. Well appearing and in no acute distress. Eyes: Conjunctivae are normal. PERRL. EOMI. Head: Atraumatic. ENT:      Ears:       Nose: No congestion/rhinnorhea.      Mouth/Throat: Mucous membranes are moist.  Neck: No stridor.  Hematological/Lymphatic/Immunilogical: No cervical lymphadenopathy. Cardiovascular: Normal rate, regular rhythm. Normal S1 and S2.  Good peripheral circulation. Respiratory: Normal respiratory effort without tachypnea or retractions. Lungs CTAB. Good air entry to the bases with no decreased or absent breath sounds.  Gastrointestinal: Bowel sounds 4 quadrants.  Soft to palpation all quadrants.  Patient is tender to palpation in the left upper quadrant radiating towards the midline.  There is no extension into the right upper quadrant.  No lower quadrant tenderness to palpation.  No guarding or rigidity. No palpable masses. No distention. No CVA tenderness. Musculoskeletal: Full range of motion to all extremities. No gross deformities appreciated. Neurologic:  Normal speech and language. No gross focal neurologic deficits are appreciated.  Skin:  Skin is warm, dry and intact. No rash noted. Psychiatric: Mood and affect are normal. Speech and behavior are normal. Patient exhibits appropriate insight and judgement.   ____________________________________________   LABS (all labs ordered are listed, but only abnormal results are displayed)  Labs Reviewed  URINALYSIS, COMPLETE (UACMP) WITH MICROSCOPIC - Abnormal; Notable for the following components:      Result Value   Color, Urine YELLOW (*)    APPearance CLEAR (*)    Specific Gravity, Urine 1.003 (*)    Hgb urine dipstick SMALL (*)    All other components within normal limits   BASIC METABOLIC PANEL - Abnormal; Notable for the following components:   Potassium 3.2 (*)    Glucose, Bld 177 (*)    Calcium 8.5 (*)    All other components within normal limits  CBC - Abnormal; Notable for the following components:   WBC 3.2 (*)    RBC 2.60 (*)    Hemoglobin 9.1 (*)    HCT 24.9 (*)    MCH 35.0 (*)    MCHC 36.5 (*)    RDW 15.9 (*)    Platelets 39 (*)    All other components within normal limits  HEPATIC FUNCTION PANEL - Abnormal; Notable for the following components:   AST 78 (*)    ALT 89 (*)    Total Bilirubin 7.2 (*)    Bilirubin, Direct 4.5 (*)    Indirect Bilirubin 2.7 (*)    All other components within normal limits  LIPASE, BLOOD - Abnormal; Notable for the following components:   Lipase 6,185 (*)    All other components within normal limits  SARS CORONAVIRUS 2 (TAT 6-24 HRS)   ____________________________________________  EKG   ____________________________________________  RADIOLOGY I personally viewed and evaluated these images as part of my medical decision making, as well as reviewing the written report by the radiologist.  ED Provider Interpretation: Findings consistent with choledocholithiasis with associated pancreatitis is appreciated.  CT ABDOMEN PELVIS W CONTRAST  Result Date: 04/16/2020 CLINICAL DATA:  Left upper quadrant abdominal pain. History of liver carcinoma. EXAM: CT ABDOMEN AND PELVIS WITH CONTRAST TECHNIQUE: Multidetector CT imaging of the abdomen and pelvis was performed using the standard protocol following bolus administration of intravenous contrast. CONTRAST:  144mL OMNIPAQUE IOHEXOL 300 MG/ML  SOLN COMPARISON:  01/01/2020 FINDINGS: Lower chest: No acute abnormality. Hepatobiliary: Well-defined hypo attenuate mass in segment 5, 3.9 x 3.1 x 3.3 cm, smaller than on the prior CT. Liver shows morphologic changes consistent with cirrhosis with central volume loss and enlargement of the lateral segment of the left lobe and  caudate lobe and mild surface nodularity. No other liver masses or lesions. There are gallstones, but no gallbladder wall thickening or adjacent inflammation. Small stone projects in the distal common bile duct near the ampulla. Common bile duct is dilated to 8.5 mm. Pancreas: Hazy inflammatory change extends along the margins of the pancreas, most evident adjacent to the pancreatic head and tail, but also tracking along the  duodenum with fluid attenuation noted along the anterior pararenal fascia, greater on the right. No pancreatic mass or significant duct dilation. Homogeneous pancreatic enhancement is noted. Spleen: Prominent spleen measuring 14 cm in greatest dimension. No splenic mass or focal lesion. Adrenals/Urinary Tract: No adrenal masses. Kidneys are normal in size, orientation and position with symmetric enhancement and excretion. 1.8 cm cyst, upper pole of the left kidney. Tiny adjacent low-density lesion likely also a cyst. No other renal masses or lesions. No stones. No hydronephrosis. Ureters normal in course and in caliber. Bladder is unremarkable. Stomach/Bowel: Normal stomach. Fluid attenuation/inflammatory changes track along the duodenum. Two small stones project in the second portion of the duodenum adjacent to the anterior L. Small bowel and colon are normal in caliber. No wall thickening. No inflammation. Vascular/Lymphatic: Aortic atherosclerosis. No evidence of thrombosis of the portal vein, splenic vein or superior mesenteric vein. No enlarged lymph nodes. Reproductive: Mild prostate enlargement transverse, dimension. 5.2 cm in greatest Other: None. Musculoskeletal: No fracture or acute finding.  No bone lesion. IMPRESSION: 1. Acute uncomplicated pancreatitis. Specifically, no evidence of pancreatic necrosis or of an abscess/pseudocyst. No venous thrombosis. Etiology appears due to passage of a gallstone. 2. Choledocholithiasis. Small gallstone projects in the distal common bile duct near  the ampulla Vater with two other small stones noted in the adjacent second portion of the duodenum. Mild dilation of the common bile duct. There are several stones within the gallbladder. 3. 3.9 cm hypoattenuating, rim enhancing liver mass, smaller than on the prior CT, presumably the residua of treated liver carcinoma. 4. Cirrhosis, appears stable from the prior CT. 5. Aortic atherosclerosis. Electronically Signed   By: Lajean Manes M.D.   On: 04/16/2020 16:43    ____________________________________________    PROCEDURES  Procedure(s) performed:    Procedures    Medications  iohexol (OMNIPAQUE) 300 MG/ML solution 100 mL (100 mLs Intravenous Contrast Given 04/16/20 1615)  morphine 4 MG/ML injection 4 mg (4 mg Intravenous Given 04/16/20 1805)  ondansetron (ZOFRAN) injection 4 mg (4 mg Intravenous Given 04/16/20 1807)  HYDROmorphone (DILAUDID) injection 0.5 mg (0.5 mg Intravenous Given 04/16/20 1946)     ____________________________________________   INITIAL IMPRESSION / ASSESSMENT AND PLAN / ED COURSE  Pertinent labs & imaging results that were available during my care of the patient were reviewed by me and considered in my medical decision making (see chart for details).  Review of the [place] CSRS was performed in accordance of the [place] prior to dispensing any controlled drugs.  Clinical Course as of 04/16/20 2030  Wed Apr 16, 2020  1553 Patient presented with left upper quadrant abdominal pain.  This is progressively worsening.  It is known to both his oncologist and his GI doctor and he is scheduled for an endoscopy.  Patient states that the pain has been worsening to the point that he cannot wait for the endoscopy.  Patient with anemia, thrombocytopenia [JC]    Clinical Course User Index [JC] Jaeleah Smyser, Charline Bills, PA-C          Patient's diagnosis is consistent with choledocholithiasis, pancreatitis.  Patient presented to emergency department left upper quadrant abdominal  pain.  He has a history of hepatic cancer.  This is being followed by oncology.  Patient has done radiation but is not currently on chemotherapy.  Patient had some slowly progressive left upper quadrant pain.  Oncology wanted the patient evaluated with GI and he is scheduled for upper endoscopy, however he states of the last couple of  days the pain rapidly progressed.  Patient has had no emesis, diarrhea or constipation.  Patient was tender in the epigastric region.  Differential included metastasis, pancreatitis, cholecystitis, cholelithiasis, choledocholithiasis, gastritis, peptic ulcer disease.  Labs revealed elevated LFTs, T bili.  Elevated lipase.  Patient with findings on CT consistent with pancreatitis likely secondary to choledocholithiasis.  Patient will likely need ERCP.  GI provider on call tomorrow is able to perform this procedure here in this hospital.  At this time will admit to medicine.      ____________________________________________  FINAL CLINICAL IMPRESSION(S) / ED DIAGNOSES  Final diagnoses:  Choledocholithiasis  Acute biliary pancreatitis without infection or necrosis      NEW MEDICATIONS STARTED DURING THIS VISIT:  ED Discharge Orders    None          This chart was dictated using voice recognition software/Dragon. Despite best efforts to proofread, errors can occur which can change the meaning. Any change was purely unintentional.    Brynda Peon 04/16/20 2031    Lavonia Drafts, MD 04/16/20 2203

## 2020-04-16 NOTE — ED Triage Notes (Signed)
Pt to ED POV for bilateral flank pain x2 days. Reports dark colored urine. Denies burning with urination or fevers.  Reports taking tylenol PTA NAd noted

## 2020-04-16 NOTE — H&P (Signed)
History and Physical  Timothy Murray FWY:[id number] DOB: [birth year] DOA: 04/16/2020  Referring physician: Dr. Guerry Minors, [facility]  PCP: Danelle Berry, NP  Outpatient Specialists: GI, oncology. Patient coming from: Home.  Chief Complaint: Worsening left upper quadrant abdominal pain  HPI: Timothy Murray is a 75 y.o. male with medical history significant for hepatocellular carcinoma status post proton beam radiation, completed on 06/25/2019, essential hypertension, hyperlipidemia, type 2 diabetes, chronic thrombocytopenia who presented to [facility] ED with complaints of progressively worsening left upper quadrant abdominal pain.  Not associated with nausea or vomiting or diarrhea.  States for the past 2 days his abdominal pain has been uncontrolled.  Now it has become diffused.  States that he was taking Tylenol and oxycodone, stopped taking these 2 days ago as he was concerned about making his liver worse.  His abdominal pain is worse after eating.  His appetite has been poor due to the pain.  He decided to present to the ED for further evaluation.  Work-up in the ED revealed acute pancreatitis, hyperbilirubinemia, and choledocholithiasis.  TRH was asked to admit.   ED Course:  Afebrile, BP 149/69, heart rate 91, respiratory 14, O2 saturation 99% on room air.  Lab studies remarkable for serum potassium 3.2, serum glucose 177, lipase 6100, AST 78, ALT 89, total bilirubin 7.2, indirect bilirubin 2.7, direct bilirubin 4.5.,  Platelet 39K, WBC 3.2K, hemoglobin 9.1K.  Review of Systems: Review of systems as noted in the HPI. All other systems reviewed and are negative.   Past Medical History:  Diagnosis Date  . Arthritis    "lower back" (10/24/2012)  . Coronary artery disease   . GERD (gastroesophageal reflux disease)   . High cholesterol   . Hypertension   . Type II diabetes mellitus ([place])    Past Surgical History:  Procedure Laterality Date  . APPENDECTOMY  2002  . CARDIAC CATHETERIZATION   10/24/2012  . CORONARY ARTERY BYPASS GRAFT N/A 10/26/2012   Procedure: CORONARY ARTERY BYPASS GRAFTING (CABG);  Surgeon: Melrose Nakayama, MD;  Location: [place];  Service: Open Heart Surgery;  Laterality: N/A;  Times 3 using left internal mammary artery and endoscopically harvested right saphenous vein    Social History:  reports that he quit smoking about 18 years ago. His smoking use included cigarettes. He has a 15.00 pack-year smoking history. He has never used smokeless tobacco. He reports that he does not drink alcohol and does not use drugs.   Allergies  Allergen Reactions  . Other Nausea And Vomiting  . Soap & Cleansers     "liquid soap" antibacterial -rash  . Latex Rash and Itching    Family History  Problem Relation Age of Onset  . Cancer Sister     Prior to Admission medications   Medication Sig Start Date End Date Taking? Authorizing Provider  allopurinol (ZYLOPRIM) 100 MG tablet Take 100 mg by mouth daily. Patient not taking: No sig reported 11/26/16   [provider]  aspirin EC 81 MG tablet Take 81 mg by mouth daily.    [provider]  atorvastatin (LIPITOR) 40 MG tablet Take 1 tablet by mouth daily. 11/21/19   [provider]  fluocinonide cream (LIDEX) 0.05 %  11/17/12   [provider]  furosemide (LASIX) 40 MG tablet Take 40 mg by mouth daily. Patient not taking: No sig reported    [provider]  glimepiride (AMARYL) 1 MG tablet Take 1 mg by mouth daily.    [provider]  glucosamine-chondroitin 500-400 MG tablet Take 1 tablet by mouth daily.    [provider]  ibuprofen (ADVIL,MOTRIN) 600 MG tablet Take 1 tablet (600 mg total) by mouth every 6 (six) hours as needed. Patient not taking: Reported on 04/02/2020 06/09/15   Arlyss Repress, PA-C  losartan (COZAAR) 25 MG tablet Take 25 mg by mouth daily.    [provider]  metoprolol tartrate (LOPRESSOR) 25 MG tablet Take 25 mg by mouth  daily.    [provider]  nitroGLYCERIN (NITROSTAT) 0.3 MG SL tablet Place under the tongue. Patient not taking: Reported on 04/02/2020 02/18/20   [provider]  omeprazole (PRILOSEC) 20 MG capsule Take 40 mg by mouth every morning.    [provider]  oxyCODONE-acetaminophen (PERCOCET) 5-325 MG tablet Take 1 tablet by mouth every 4 (four) hours as needed for severe pain. Patient not taking: Reported on 04/02/2020 01/01/20 12/31/20  Lavonia Drafts, MD  potassium chloride SA (K-DUR,KLOR-CON) 20 MEQ tablet Take 20 mEq by mouth daily. Patient not taking: No sig reported    [provider]  saxagliptin HCl (ONGLYZA) 2.5 MG TABS tablet Take 2.5 mg by mouth daily. Patient not taking: No sig reported    [provider]  zolpidem (AMBIEN) 10 MG tablet Take 10 mg by mouth at bedtime as needed for sleep. Patient not taking: No sig reported    [provider]    Physical Exam: BP (!) 149/69   Pulse 91   Temp 98.4 F (36.9 C) (Oral)   Resp 14   Ht 6' (1.829 m)   Wt 93 kg   SpO2 99%   BMI 27.80 kg/m   . General: 75 y.o. year-old male well developed well nourished in no acute distress.  Alert and oriented x3. . Cardiovascular: Regular rate and rhythm with no rubs or gallops.  No thyromegaly or JVD noted.  No lower extremity edema. 2/4 pulses in all 4 extremities. Marland Kitchen Respiratory: Clear to auscultation with no wheezes or rales. Good inspiratory effort. . Abdomen: Soft diffuse tenderness with palpation nondistended with normal bowel sounds x4 quadrants. . Muskuloskeletal: No cyanosis, clubbing or edema noted bilaterally . Neuro: CN II-XII intact, strength, sensation, reflexes . Skin: No ulcerative lesions noted or rashes.  Mildly jaundiced. Marland Kitchen Psychiatry: Judgement and insight appear normal. Mood is appropriate for condition and setting          Labs on Admission:  Basic Metabolic Panel: Recent Labs  Lab 04/16/20 1415  NA 138  K 3.2*  CL  105  CO2 24  GLUCOSE 177*  BUN 11  CREATININE 0.95  CALCIUM 8.5*   Liver Function Tests: Recent Labs  Lab 04/16/20 1415  AST 78*  ALT 89*  ALKPHOS 78  BILITOT 7.2*  PROT 6.7  ALBUMIN 3.9   Recent Labs  Lab 04/16/20 1415  LIPASE 6,185*   No results for input(s): AMMONIA in the last 168 hours. CBC: Recent Labs  Lab 04/16/20 1415  WBC 3.2*  HGB 9.1*  HCT 24.9*  MCV 95.8  PLT 39*   Cardiac Enzymes: No results for input(s): CKTOTAL, CKMB, CKMBINDEX, TROPONINI in the last 168 hours.  BNP (last 3 results) No results for input(s): BNP in the last 8760 hours.  ProBNP (last 3 results) No results for input(s): PROBNP in the last 8760 hours.  CBG: No results for input(s): GLUCAP in the last 168 hours.  Radiological Exams on Admission: CT ABDOMEN PELVIS W CONTRAST  Result Date: 04/16/2020 CLINICAL DATA:  Left upper quadrant abdominal pain. History of liver carcinoma. EXAM: CT ABDOMEN AND PELVIS WITH CONTRAST TECHNIQUE: Multidetector CT imaging of the abdomen and pelvis was performed using the standard protocol following bolus administration of intravenous contrast. CONTRAST:  127mL OMNIPAQUE IOHEXOL 300 MG/ML  SOLN COMPARISON:  01/01/2020 FINDINGS: Lower chest: No acute abnormality. Hepatobiliary: Well-defined hypo attenuate mass in segment 5, 3.9 x 3.1 x 3.3 cm, smaller than on the prior CT. Liver shows morphologic changes consistent with cirrhosis with central volume loss and enlargement of the lateral segment of the left lobe and caudate lobe and mild surface nodularity. No other liver masses or lesions. There are gallstones, but no gallbladder wall thickening or adjacent inflammation. Small stone projects in the distal common bile duct near the ampulla. Common bile duct is dilated to 8.5 mm. Pancreas: Hazy inflammatory change extends along the margins of the pancreas, most evident adjacent to the pancreatic head and tail, but also tracking along the duodenum with fluid  attenuation noted along the anterior pararenal fascia, greater on the right. No pancreatic mass or significant duct dilation. Homogeneous pancreatic enhancement is noted. Spleen: Prominent spleen measuring 14 cm in greatest dimension. No splenic mass or focal lesion. Adrenals/Urinary Tract: No adrenal masses. Kidneys are normal in size, orientation and position with symmetric enhancement and excretion. 1.8 cm cyst, upper pole of the left kidney. Tiny adjacent low-density lesion likely also a cyst. No other renal masses or lesions. No stones. No hydronephrosis. Ureters normal in course and in caliber. Bladder is unremarkable. Stomach/Bowel: Normal stomach. Fluid attenuation/inflammatory changes track along the duodenum. Two small stones project in the second portion of the duodenum adjacent to the anterior L. Small bowel and colon are normal in caliber. No wall thickening. No inflammation. Vascular/Lymphatic: Aortic atherosclerosis. No evidence of thrombosis of the portal vein, splenic vein or superior mesenteric vein. No enlarged lymph nodes. Reproductive: Mild prostate enlargement transverse, dimension. 5.2 cm in greatest Other: None. Musculoskeletal: No fracture or acute finding.  No bone lesion. IMPRESSION: 1. Acute uncomplicated pancreatitis. Specifically, no evidence of pancreatic necrosis or of an abscess/pseudocyst. No venous thrombosis. Etiology appears due to passage of a gallstone. 2. Choledocholithiasis. Small gallstone projects in the distal common bile duct near the ampulla Vater with two other small stones noted in the adjacent second portion of the duodenum. Mild dilation of the common bile duct. There are several stones within the gallbladder. 3. 3.9 cm hypoattenuating, rim enhancing liver mass, smaller than on the prior CT, presumably the residua of treated liver carcinoma. 4. Cirrhosis, appears stable from the prior CT. 5. Aortic atherosclerosis. Electronically Signed   By: Lajean Manes M.D.   On:  04/16/2020 16:43    EKG: I independently viewed the EKG done and my findings are as followed: Sinus rhythm 85 with nonspecific ST-T changes.  QTc 442.  Assessment/Plan Present on Admission: . Choledocholithiasis  Active Problems:   Choledocholithiasis  Acute uncomplicated pancreatitis seen on CT scan abdomen and pelvis with contrast done on 04/16/2020. No evidence of pancreatic necrosis or abscess or pseudocyst.  No venous thrombosis. Etiology appears to be due to passage of gallstone. Presented with lipase level greater than 6200 Repeat lipase level in the morning N.p.o. for now IV fluid hydration LR with KCl at 100 cc/h x 1 day. Pain control with IV Dilaudid as needed for severe pain and oxycodone as needed for moderate and breakthrough pain.  Elevated liver chemistries with choledocholithiasis Presented with elevated alkaline phosphatase, AST, ALT and T bilirubin  with evidence of choledocholithiasis on CT scan. Avoid hepatotoxic agents Hold off home statin and home Tylenol for now Repeat CMP in the morning GI consult in the morning for possible ERCP  Hyperbilirubinemia likely from choledocholithiasis Presented with T bili greater than 7 Repeat CMP in the morning.  Hepatocellular carcinoma post proton beam radiation completed on 06/25/2019 at outside institution in [place]. Currently follows with hematology oncology. Meld score 14 (with INR from 2018 which was 1.02.) INR ordered and is pending. Avoid hepatotoxic agents. Obtain ammonia level.  Hypokalemia Presented with potassium level of 3.2 with Repleted intravenously Recheck in the morning  Pancytopenia likely secondary to hepatocellular carcinoma Presented with WBC 3.2K, hemoglobin 9.1K, platelet count of 39K  Chronic thrombocytopenia No reported bleeding Presented with platelet count of 39,000. Hold off home aspirin for now. Continue to monitor and repeat CBC in the morning.  Essential hypertension Resume  home Lopressor at lower doses 12.5 mg twice daily to avoid hypotension. Monitor vital signs. Maintain MAP greater than 65.  Aortic atherosclerosis At home he is on aspirin and statin, hold off both for now due to elevated liver chemistries and thrombocytopenia.    DVT prophylaxis: SCDs.  Code Status: Full code as stated by the patient himself.  Family Communication: Plan of care discussed with the patient and his wife at bedside.  All questions answered to the best of my ability.  Disposition Plan: Admit to MedSurg with remote telemetry.  Consults called: Please consult GI in the morning.  Admission status:    Status is: Inpatient    Dispo: The patient is from: Home.              Anticipated d/c is to: Home.              Patient currently not stable for discharge due to ongoing symptomatology.   Difficult to place patient, not applicable.       Kayleen Memos MD [facility] Pager [phone]  If 7PM-7AM, please contact night-coverage [email or web address] Password [facility] [place]  04/16/2020, 8:51 PM

## 2020-04-17 ENCOUNTER — Encounter: Payer: Self-pay | Admitting: Internal Medicine

## 2020-04-17 ENCOUNTER — Encounter: Payer: Self-pay | Admitting: Nurse Practitioner

## 2020-04-17 ENCOUNTER — Inpatient Hospital Stay: Payer: Medicare HMO

## 2020-04-17 DIAGNOSIS — C220 Liver cell carcinoma: Secondary | ICD-10-CM | POA: Diagnosis not present

## 2020-04-17 DIAGNOSIS — K8036 Calculus of bile duct with acute and chronic cholangitis without obstruction: Secondary | ICD-10-CM | POA: Diagnosis not present

## 2020-04-17 DIAGNOSIS — K8050 Calculus of bile duct without cholangitis or cholecystitis without obstruction: Secondary | ICD-10-CM | POA: Diagnosis not present

## 2020-04-17 DIAGNOSIS — K7460 Unspecified cirrhosis of liver: Secondary | ICD-10-CM | POA: Diagnosis not present

## 2020-04-17 DIAGNOSIS — K8020 Calculus of gallbladder without cholecystitis without obstruction: Secondary | ICD-10-CM | POA: Diagnosis not present

## 2020-04-17 DIAGNOSIS — K8689 Other specified diseases of pancreas: Secondary | ICD-10-CM | POA: Diagnosis not present

## 2020-04-17 LAB — COMPREHENSIVE METABOLIC PANEL
ALT: 74 U/L — ABNORMAL HIGH (ref 0–44)
AST: 57 U/L — ABNORMAL HIGH (ref 15–41)
Albumin: 3.5 g/dL (ref 3.5–5.0)
Alkaline Phosphatase: 71 U/L (ref 38–126)
Anion gap: 7 (ref 5–15)
BUN: 9 mg/dL (ref 8–23)
CO2: 27 mmol/L (ref 22–32)
Calcium: 8.5 mg/dL — ABNORMAL LOW (ref 8.9–10.3)
Chloride: 105 mmol/L (ref 98–111)
Creatinine, Ser: 0.84 mg/dL (ref 0.61–1.24)
GFR, Estimated: >60 mL/min (ref >60)
Glucose, Bld: 111 mg/dL — ABNORMAL HIGH (ref 70–99)
Potassium: 3.6 mmol/L (ref 3.5–5.1)
Sodium: 139 mmol/L (ref 135–145)
Total Bilirubin: 6.8 mg/dL — ABNORMAL HIGH (ref 0.3–1.2)
Total Protein: 6 g/dL — ABNORMAL LOW (ref 6.5–8.1)

## 2020-04-17 LAB — LIPASE, BLOOD: Lipase: 690 U/L — ABNORMAL HIGH (ref 11–51)

## 2020-04-17 LAB — CBG MONITORING, ED
Glucose-Capillary: 66 mg/dL — ABNORMAL LOW (ref 70–99)
Glucose-Capillary: 82 mg/dL (ref 70–99)
Glucose-Capillary: 97 mg/dL (ref 70–99)
Glucose-Capillary: 84 mg/dL (ref 70–99)

## 2020-04-17 LAB — AMMONIA: Ammonia: 12 umol/L (ref 9–35)

## 2020-04-17 LAB — PROTIME-INR
INR: 1.2 (ref 0.8–1.2)
Prothrombin Time: 15.3 seconds — ABNORMAL HIGH (ref 11.4–15.2)

## 2020-04-17 LAB — HEMOGLOBIN A1C
Hgb A1c MFr Bld: 6.6 % — ABNORMAL HIGH (ref 4.8–5.6)
Mean Plasma Glucose: 142.72 mg/dL

## 2020-04-17 LAB — GLUCOSE, CAPILLARY: Glucose-Capillary: 96 mg/dL (ref 70–99)

## 2020-04-17 LAB — SARS CORONAVIRUS 2 (TAT 6-24 HRS): SARS Coronavirus 2: NEGATIVE

## 2020-04-17 LAB — MAGNESIUM: Magnesium: 2.2 mg/dL (ref 1.7–2.4)

## 2020-04-17 IMAGING — MR MR ABDOMEN WO/W CM MRCP
19 of 20 series · 45 of 48 positions shown · IV contrast (gadavist)
Comparison: CT abdomen [DATE]

CLINICAL DATA: Prior gallstone pancreatitis, hepatocellular
carcinoma. Abdominal pain. Choledocholithiasis on CT scan yesterday.

EXAM:
MRI ABDOMEN WITHOUT AND WITH CONTRAST (INCLUDING MRCP)
TECHNIQUE: Multiplanar multisequence MR imaging of the abdomen was performed
both before and after the administration of intravenous contrast.
Heavily T2-weighted images of the biliary and pancreatic ducts were
obtained, and three-dimensional MRCP images were rendered by post
processing.
CONTRAST:  9mL GADAVIST GADOBUTROL 1 MMOL/ML IV SOLN

[Series 3: T2 · coronal · 6.0mm · 1.19mm/px · 1 of 30 slices shown (1 of 2)]
[im 1/30]
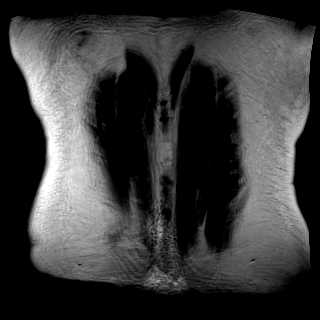

[Series 4: T2 · axial · 6.0mm · 1.19mm/px · 1 of 32 slices shown (2 of 2)]
[im 1/32]
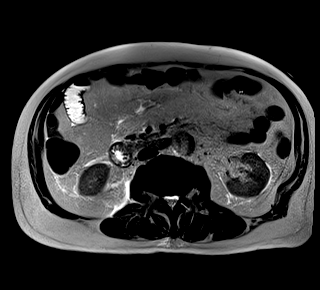

[Series 8: T2 fat-sat · axial · 6.0mm · 1.19mm/px · 1 of 30 slices shown]
[im 1/30]
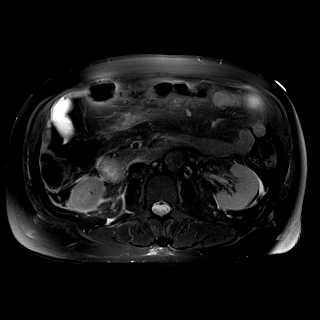

[Series 9: ax dwi_tracew · axial · 6.0mm · 1.42mm/px · z∈[-116,+93]mm · 3 of 90 slices shown]
[im 1/90]
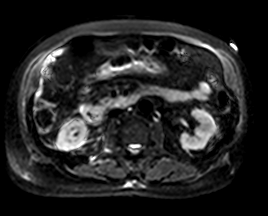
[im 45/90]
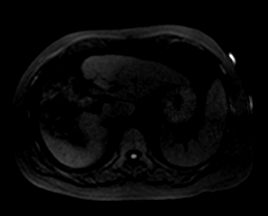
[im 90/90]
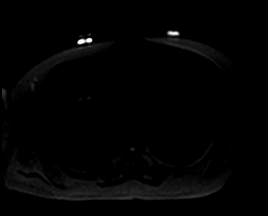

[Series 10: ax dwi_adc · axial · 6.0mm · 1.42mm/px · 1 of 30 slices shown]
[im 1/30]
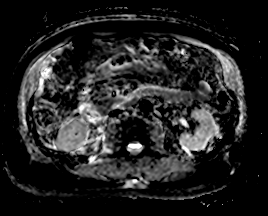

[Series 14: MRCP · coronal · 3.0mm · 1.12mm/px · 1 of 17 slices shown]
[im 1/17]
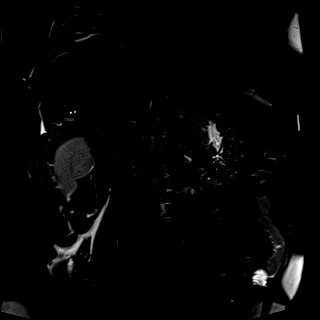

[Series 15: in & out · axial · 3.0mm · 1.19mm/px · z∈[-128,+85]mm · 3 of 72 slices shown (1 of 2)]
[im 1/72]
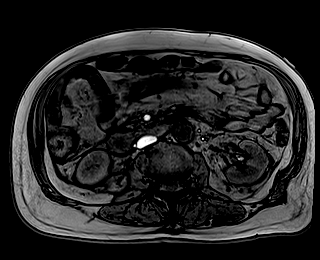
[im 36/72]
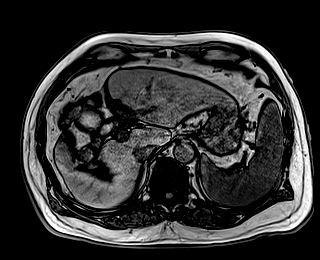
[im 72/72]
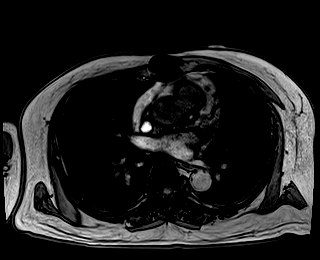

[Series 16: in & out · axial · 3.0mm · 1.19mm/px · z∈[-128,+85]mm · 3 of 72 slices shown (2 of 2)]
[im 1/72]
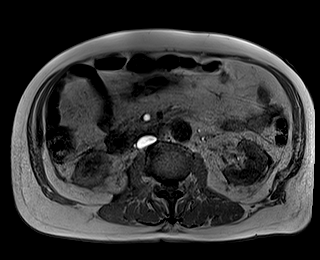
[im 36/72]
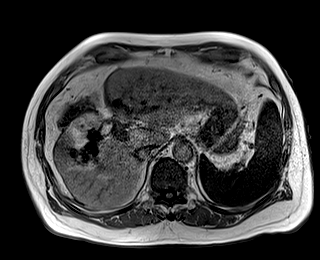
[im 72/72]
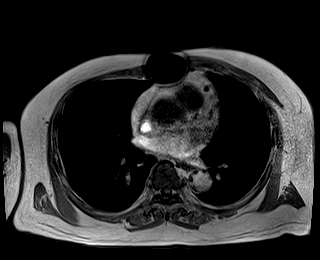

[Series 17: radials · coronal · 50.0mm · 0.78mm/px · 1 of 5 slices shown]
[im 1/5]
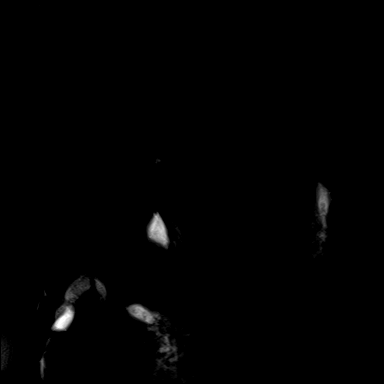

[Series 18: T1 dynamic fat-sat · axial · non-contrast · 3.0mm · 1.19mm/px · z∈[-118,+95]mm · 3 of 72 slices shown (1 of 5)]
[im 1/72]
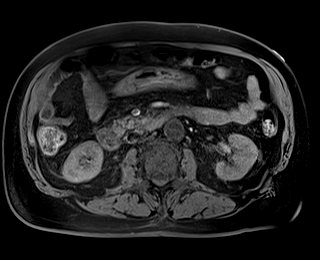
[im 36/72]
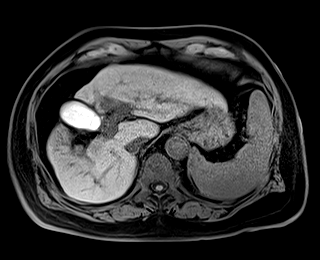
[im 72/72]
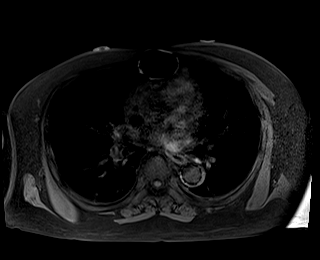

[Series 19: T1 dynamic fat-sat post-contrast · axial · 3.0mm · 1.19mm/px · z∈[-118,+95]mm · 3 of 72 slices shown (1 of 4)]
[im 1/72]
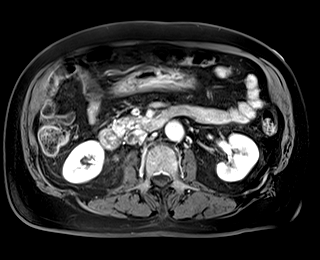
[im 36/72]
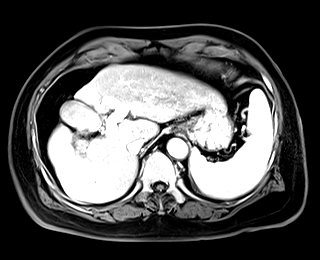
[im 72/72]
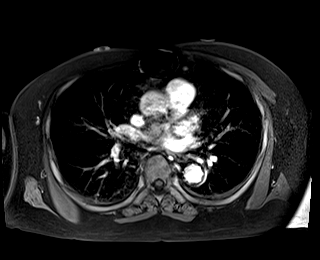

[Series 20: T1 dynamic fat-sat · axial · 3.0mm · 1.19mm/px · z∈[-118,+95]mm · 3 of 72 slices shown (2 of 5)]
[im 1/72]
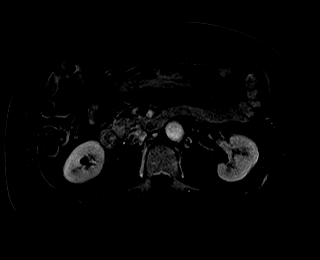
[im 36/72]
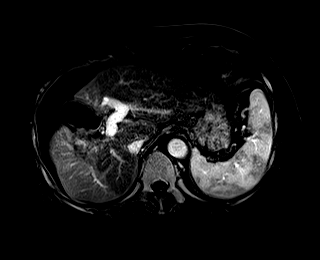
[im 72/72]
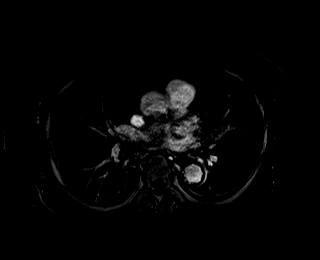

[Series 21: T1 dynamic fat-sat post-contrast · axial · 3.0mm · 1.19mm/px · z∈[-118,+95]mm · 3 of 72 slices shown (2 of 4)]
[im 1/72]
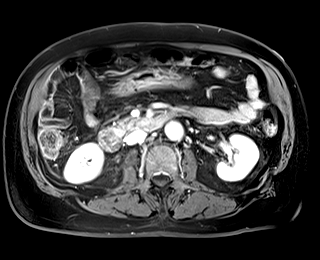
[im 36/72]
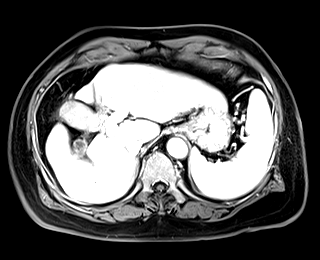
[im 72/72]
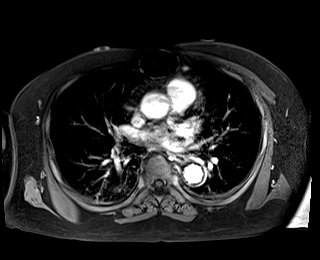

[Series 22: T1 dynamic fat-sat · axial · 3.0mm · 1.19mm/px · z∈[-118,+95]mm · 3 of 72 slices shown (3 of 5)]
[im 1/72]
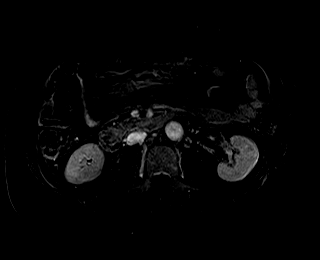
[im 36/72]
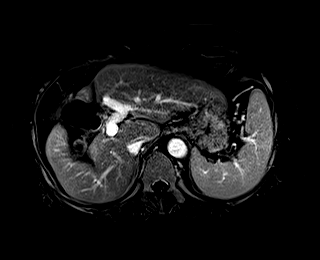
[im 72/72]
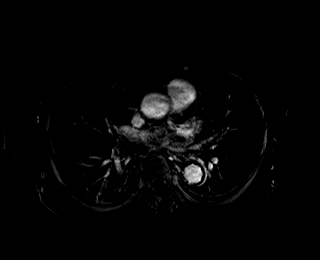

[Series 23: T1 dynamic fat-sat post-contrast · axial · 3.0mm · 1.19mm/px · z∈[-118,+95]mm · 3 of 72 slices shown (3 of 4)]
[im 1/72]
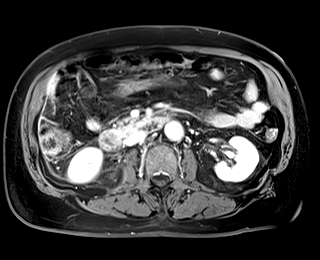
[im 36/72]
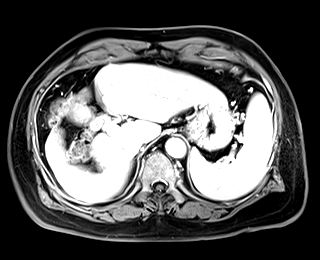
[im 72/72]
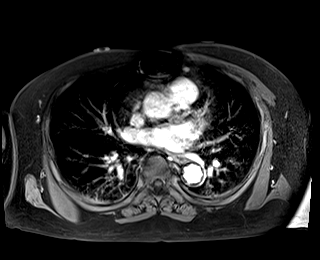

[Series 24: T1 dynamic fat-sat · axial · 3.0mm · 1.19mm/px · z∈[-118,+95]mm · 3 of 72 slices shown (4 of 5)]
[im 1/72]
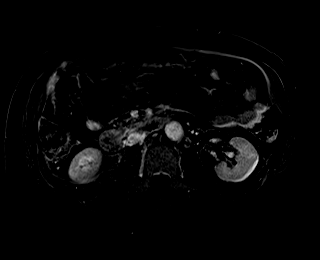
[im 36/72]
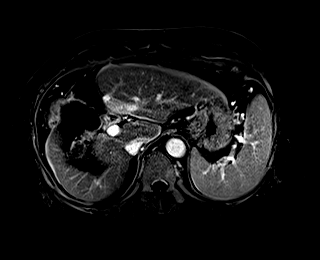
[im 72/72]
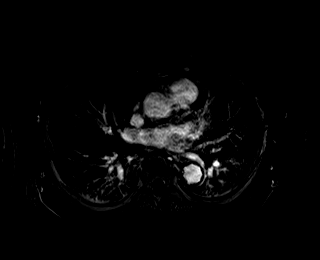

[Series 25: T1 dynamic post-contrast · coronal · 3.0mm · 1.31mm/px · 3 of 72 slices shown]
[im 1/72]
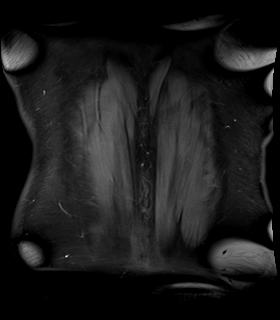
[im 36/72]
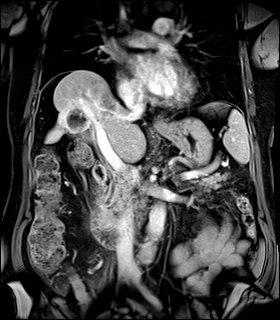
[im 72/72]
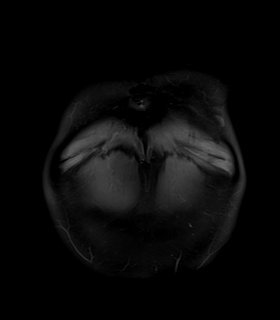

[Series 26: T1 dynamic fat-sat post-contrast · axial · 3.0mm · 1.19mm/px · z∈[-118,+95]mm · 3 of 72 slices shown (4 of 4)]
[im 1/72]
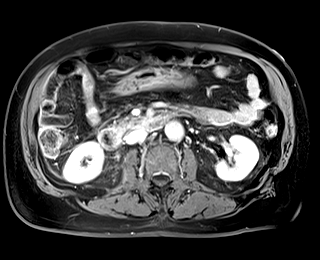
[im 36/72]
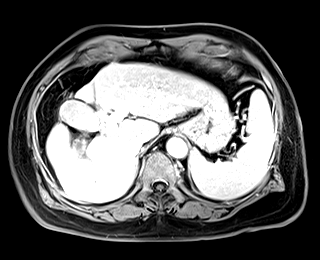
[im 72/72]
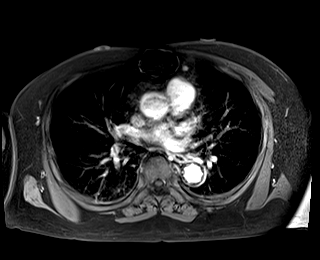

[Series 27: T1 dynamic fat-sat · axial · 3.0mm · 1.19mm/px · z∈[-118,+95]mm · 3 of 72 slices shown (5 of 5)]
[im 1/72]
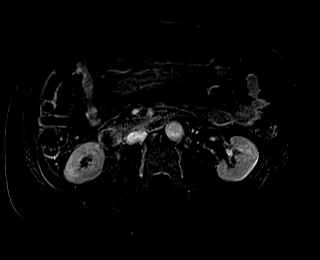
[im 36/72]
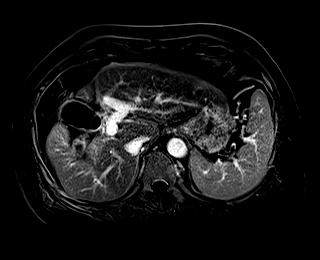
[im 72/72]
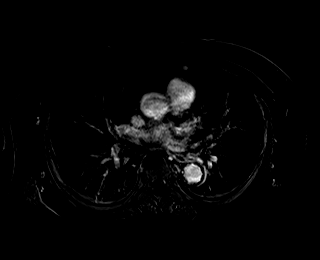

[45 of 48 positions shown; findings below may reference images not displayed]

FINDINGS: Lower chest: Median sternotomy.

Hepatobiliary: Hepatic cirrhosis. 4.3 by 2.9 by 3.6 cm mass in
segment 5 of the liver, mostly with central necrosis but with
enhancing marginal thickness of up to about 0.7 cm and with some
speckled potentially enhancing internal nodular elements.

High T1 signal in the gallbladder. Suspected small gallstones in the
gallbladder. On the 3D MRCP images, the common bile duct measures
0.5 cm in diameter (previously about 0.8 cm on yesterday CT scan) at
although the dedicated MRCP images such image 33 of series 12 do not
show filling defects, there is some questionable granularity in the
distal CBD region for example on image 11 of series 14 which could
conceivably indicate tiny residual calculi. However, these do not
appear to be as prominent as the string of calculi shown on
[DATE] CT scan.

Pancreas: Unremarkable where included. 0.4 cm cystic lesion in the
junction of the pancreatic body and tail on image 14 series 14. No
findings of pancreatic abscess, necrosis, or pseudocyst. Subtle
peripancreatic edema.

Spleen:  Unremarkable

Adrenals/Urinary Tract: Simple appearing left kidney upper pole
cyst. Adrenal glands unremarkable.

Stomach/Bowel: Unremarkable

Vascular/Lymphatic:  Aortoiliac atherosclerotic vascular disease.

Other:  Trace fluid in the right paracolic gutter.

Musculoskeletal: Unremarkable
IMPRESSION: 1. Low-grade peripancreatic edema may be a manifestation of prior
pancreatitis.
2. Most if not all of the prior distal CBD stones appear to of
cleared. On the MRCP images the distal CBD appears normal and there
is no more extrahepatic biliary dilatation, although on some of the
other coronal sequences there is a subtle granularity in the distal
CBD which could represent residual tiny stone(s) but substantially
smaller and less prominent than on recent CT.
3. Small gallstones in the gallbladder.
4. Centrally necrotic mass in segment 5 of the liver.
5. Hepatic cirrhosis.
6.  Aortic Atherosclerosis ([22]-[22]).
7. Trace fluid in the right paracolic gutter.

## 2020-04-17 MED ORDER — KCL-LACTATED RINGERS-D5W 20 MEQ/L IV SOLN
INTRAVENOUS | Status: DC
  Filled 2020-04-17 (×4): qty 1000

## 2020-04-17 MED ORDER — GADOBUTROL 1 MMOL/ML IV SOLN
9.0000 mL | Freq: Once | INTRAVENOUS | Status: AC | PRN
  Administered 2020-04-17: 9 mL via INTRAVENOUS
  Filled 2020-04-17: qty 10

## 2020-04-17 NOTE — Care Management Obs Status (Signed)
[facility] NOTIFICATION   Patient Details  Name: Timothy Murray MRN: [id number] Date of Birth: July 04, 1945   Medicare Observation Status Notification Given:  Yes    Shelbie Hutching, RN 04/17/2020, 4:33 PM

## 2020-04-17 NOTE — Care Management CC44 (Signed)
Condition Code 44 Documentation Completed  Patient Details  Name: Timothy Murray MRN: [id number] Date of Birth: Jun 15, 1945   Condition Code 44 given:  Yes Patient signature on Condition Code 44 notice:  Yes Documentation of 2 MD's agreement:  Yes Code 44 added to claim:  Yes    Shelbie Hutching, RN 04/17/2020, 4:33 PM

## 2020-04-17 NOTE — ED Notes (Signed)
Patient transported to MRI, IV fluids paused

## 2020-04-17 NOTE — Evaluation (Signed)
Physical Therapy Evaluation Patient Details Name: Timothy Murray MRN: [id number] DOB: 1945-07-01 Today's Date: 04/17/2020   History of Present Illness  75 y.o. male with medical history significant for hepatocellular carcinoma status post proton beam radiation, completed on 06/25/2019, essential hypertension, hyperlipidemia, type 2 diabetes, chronic thrombocytopenia who presented to [facility] ED with complaints of progressively worsening left upper quadrant abdominal pain. Work-up in the ED revealed acute pancreatitis, hyperbilirubinemia, and choledocholithiasis.   Clinical Impression  Patient agreeable to PT and reports feeling generalized weakness with activity tolerance limited by fatigue recently at home. Patient is independent at baseline at home and lives with spouse.  During evaluation, patient reports no abdominal pain with activity and mild back pain reported at rest due to laying on the stretcher. Patient is independent or Mod I independent (due to extra time required) with all mobility. Patient ambulated 234ft without assistive device with no loss of balance or significant shortness of breath noted. Sp02 96% on room air after activity with heart rate 111bpm initially after walking. Patient educated on energy conservation techniques and how to safely progress activity tolerance at home with ambulation schedule for upright conditioning. No further acute PT needs identified, and do no anticipated therapy needs at discharge at this time. Recommend routine hospital mobility with nursing supervision.     Follow Up Recommendations No PT follow up    Equipment Recommendations  None recommended by PT    Recommendations for Other Services       Precautions / Restrictions Precautions Precautions: Fall Restrictions Weight Bearing Restrictions: No      Mobility  Bed Mobility Overal bed mobility: Needs Assistance Bed Mobility: Supine to Sit     Supine to sit: Independent           Transfers Overall transfer level: Modified independent Equipment used: None             General transfer comment: good safety awareness demonstrated with functional transfers  Ambulation/Gait Ambulation/Gait assistance: Modified independent (Device/Increase time) Gait Distance (Feet): 250 Feet Assistive device: None Gait Pattern/deviations: Step-through pattern Gait velocity: decreased   General Gait Details: No loss of balance with ambulation. Patient using IV pole initially but progressed to ambulating without assistive device. No obvious shortness of breath noted, Sp02 96% on room air and heart rate 111bpm after walking. Educated on energy conservation techniques and progression of activity at home  Stairs            Wheelchair Mobility    Modified Rankin (Stroke Patients Only)       Balance   Sitting-balance support: Feet supported;No upper extremity supported Sitting balance-Leahy Scale: Normal     Standing balance support: No upper extremity supported;During functional activity Standing balance-Leahy Scale: Good                               Pertinent Vitals/Pain Pain Assessment: Faces Faces Pain Scale: Hurts a little bit Pain Location: back Pain Descriptors / Indicators: Discomfort Pain Intervention(s): Monitored during session (patient reports mild back pain secondary to laying on the stretcher)    Home Living Family/patient expects to be discharged to:: Private residence Living Arrangements: Spouse/significant other Available Help at Discharge: Family Type of Home: House Home Access:  (one step to enter)     Home Layout: One level Home Equipment: Shower seat      Prior Function Level of Independence: Independent  Hand Dominance        Extremity/Trunk Assessment   Upper Extremity Assessment Upper Extremity Assessment: Overall WFL for tasks assessed    Lower Extremity Assessment Lower Extremity  Assessment: Overall WFL for tasks assessed (5/5 strength BLE knee extension, plantarflexion)       Communication   Communication: No difficulties  Cognition Arousal/Alertness: Awake/alert Behavior During Therapy: WFL for tasks assessed/performed Overall Cognitive Status: Within Functional Limits for tasks assessed                                        General Comments      Exercises     Assessment/Plan    PT Assessment Patent does not need any further PT services  PT Problem List         PT Treatment Interventions      PT Goals (Current goals can be found in the Care Plan section)  Acute Rehab PT Goals Patient Stated Goal: to have nutrition consult    Frequency     Barriers to discharge        Co-evaluation PT/OT/SLP Co-Evaluation/Treatment: Yes Reason for Co-Treatment: Complexity of the patient's impairments (multi-system involvement) PT goals addressed during session: Mobility/safety with mobility         AM-PAC PT "6 Clicks" Mobility  Outcome Measure Help needed turning from your back to your side while in a flat bed without using bedrails?: None Help needed moving from lying on your back to sitting on the side of a flat bed without using bedrails?: None Help needed moving to and from a bed to a chair (including a wheelchair)?: None Help needed standing up from a chair using your arms (e.g., wheelchair or bedside chair)?: None Help needed to walk in hospital room?: None Help needed climbing 3-5 steps with a railing? : None 6 Click Score: 24    End of Session   Activity Tolerance: Patient tolerated treatment well Patient left: in bed;with nursing/sitter in room (sitting up on side of stretcher, on the phone with MRI) Nurse Communication: Mobility status PT Visit Diagnosis: Muscle weakness (generalized) (M62.81)    Time: 3007-6226 PT Time Calculation (min) (ACUTE ONLY): 16 min   Charges:   PT Evaluation $PT Eval Low Complexity: 1  Low PT Treatments $Therapeutic Activity: 8-22 mins        Minna Merritts, PT, MPT   Percell Locus 04/17/2020, 10:24 AM

## 2020-04-17 NOTE — Hospital Course (Signed)
DM II, GERD, HTN,  right common iliac artery aneurysm, AAA,  PAU s/p TEVAR,  CAD s/p 3v CABG in '14, thrombocytopenia  CT scan in March 2021noted a 5.3cm enhancing mass in the central right lobe of the liver,   Liver biopsy confirmed moderately differentiated hepatocellular carcinoma.  PET scan noted no evidence of metastatic disease. He was offered surgical excision but decided on proton radiation 6/202--[25 sessions]  Deemd not a candidtate for Cholecystectomy [early cholecystitis with the gallbladder wall sharing a common wall with the known [place].deemed a high risk surgical candidate]  EGD for ulcer

## 2020-04-17 NOTE — Progress Notes (Signed)
The patient, wife, and daughter received discharge education at 50 and verbalized understanding all medication regimens and follow up care. Patient left with all personal belongings.

## 2020-04-17 NOTE — Discharge Instructions (Signed)
Acute Pancreatitis  Acute pancreatitis happens when the pancreas gets swollen. The pancreas is a large gland in the body that helps to control blood sugar. It also makes enzymes that help to digest food. This condition can last a few days and cause serious problems. The lungs, heart, and kidneys may stop working. What are the causes? Causes include:  Alcohol abuse.  Drug abuse.  Gallstones.  A tumor in the pancreas. Other causes include:  Some medicines.  Some chemicals.  Diabetes.  An infection.  Damage caused by an accident.  The poison (venom) from a scorpion bite.  Belly (abdominal) surgery.  The body's defense system (immune system) attacking the pancreas (autoimmune pancreatitis).  Genes that are passed from parent to child (inherited). In some cases, the cause is not known. What are the signs or symptoms?  Pain in the upper belly that may be felt in the back. The pain may be very bad.  Swelling of the belly.  Feeling sick to your stomach (nauseous) and throwing up (vomiting).  Fever. How is this treated? You will likely have to stay in the hospital. Treatment may include:  Pain medicine.  Fluid through an IV tube.  Placing a tube in the stomach to take out the stomach contents. This may help you stop throwing up.  Not eating for 3-4 days.  Antibiotic medicines, if you have an infection.  Treating any other problems that may be the cause.  Steroid medicines, if your problem is caused by your defense system attacking your body's own tissues.  Surgery. Follow these instructions at home: Eating and drinking  Follow instructions from your doctor about what to eat and drink.  Eat foods that do not have a lot of fat in them.  Eat small meals often. Do not eat big meals.  Drink enough fluid to keep your pee (urine) pale yellow.  Do not drink alcohol if it caused your condition.   Medicines  Take over-the-counter and prescription medicines only  as told by your doctor.  Ask your doctor if the medicine prescribed to you: ? Requires you to avoid driving or using heavy machinery. ? Can cause trouble pooping (constipation). You may need to take steps to prevent or treat trouble pooping:  Take over-the-counter or prescription medicines.  Eat foods that are high in fiber. These include beans, whole grains, and fresh fruits and vegetables.  Limit foods that are high in fat and sugar. These include fried or sweet foods. General instructions  Do not use any products that contain nicotine or tobacco, such as cigarettes, e-cigarettes, and chewing tobacco. If you need help quitting, ask your doctor.  Get plenty of rest.  Check your blood sugar at home as told by your doctor.  Keep all follow-up visits as told by your doctor. This is important. Contact a doctor if:  You do not get better as quickly as expected.  You have new symptoms.  Your symptoms get worse.  You have pain or weakness that lasts a long time.  You keep feeling sick to your stomach.  You get better and then you have pain again.  You have a fever. Get help right away if:  You cannot eat or keep fluids down.  Your pain gets very bad.  Your skin or the white part of your eyes turns yellow.  You have sudden swelling in your belly.  You throw up.  You feel dizzy or you pass out (faint).  Your blood sugar is high (over   300 mg/dL). Summary  Acute pancreatitis happens when the pancreas gets swollen.  This condition is often caused by alcohol abuse, drug abuse, or gallstones.  You will likely have to stay in the hospital for treatment. This information is not intended to replace advice given to you by your health care provider. Make sure you discuss any questions you have with your health care provider. Document Revised: 10/10/2017 Document Reviewed: 10/10/2017 Elsevier Patient Education  2021 [employer]  

## 2020-04-17 NOTE — ED Notes (Signed)
Messaged Rn bed assigned [id number]

## 2020-04-17 NOTE — Telephone Encounter (Signed)
04/17/2020 Called Barbara in specialty scheduling to check on status of biopsy, she said he was admitted to the ED yesterday, I checked and he is still admitted as of this morning, so she is unsure if she should proceed, or if they will do it in-patient. Please advise SRW

## 2020-04-17 NOTE — Evaluation (Signed)
Occupational Therapy Evaluation Patient Details Name: Timothy Murray MRN: [id number] DOB: 1945-06-04 Today's Date: 04/17/2020    History of Present Illness 75 y.o. male with medical history significant for hepatocellular carcinoma status post proton beam radiation, completed on 06/25/2019, essential hypertension, hyperlipidemia, type 2 diabetes, chronic thrombocytopenia who presented to [facility] ED with complaints of progressively worsening left upper quadrant abdominal pain. Work-up in the ED revealed acute pancreatitis, hyperbilirubinemia, and choledocholithiasis.   Clinical Impression   Timothy Murray was seen for OT evaluation this date, stated preferred name "Nomi". Prior to hospital admission, pt was Independnet in all I/ADLs and denies falls history in past 12 months. Pt lives with his spouse in home c threshold step. Currently pt reporting abdominal pain and generalized weakness, demonstrates near baseline independence to perform ADL and mobility tasks. MOD I for LB access seated EOB - abdominal precautions reviewed for comfort. Independent funcitonal reach standing w/o AD. No skilled OT needs identified. Will sign off. Please re-consult if additional OT needs arise.      Follow Up Recommendations  No OT follow up    Equipment Recommendations  None recommended by OT    Recommendations for Other Services       Precautions / Restrictions Precautions Precautions: Fall Restrictions Weight Bearing Restrictions: No      Mobility Bed Mobility Overal bed mobility: Needs Assistance Bed Mobility: Supine to Sit     Supine to sit: Independent          Transfers Overall transfer level: Independent Equipment used: None             General transfer comment: good safety awareness demonstrated with functional transfers    Balance Overall balance assessment: Independent Sitting-balance support: Feet supported;No upper extremity supported Sitting balance-Leahy Scale: Normal      Standing balance support: No upper extremity supported;During functional activity Standing balance-Leahy Scale: Good                             ADL either performed or assessed with clinical judgement   ADL Overall ADL's : Modified independent                                       General ADL Comments: MOD I for LB access seated EOB - abdominal precautions reviewed for comfort. Independent funcitonal reach standing w/o AD                  Pertinent Vitals/Pain Pain Assessment: Faces Faces Pain Scale: Hurts a little bit Pain Location: back Pain Descriptors / Indicators: Discomfort Pain Intervention(s): Monitored during session     Hand Dominance     Extremity/Trunk Assessment Upper Extremity Assessment Upper Extremity Assessment: Overall WFL for tasks assessed   Lower Extremity Assessment Lower Extremity Assessment: Overall WFL for tasks assessed       Communication Communication Communication: No difficulties   Cognition Arousal/Alertness: Awake/alert Behavior During Therapy: WFL for tasks assessed/performed Overall Cognitive Status: Within Functional Limits for tasks assessed                                     General Comments  SpO2 98-96% t/o mobility. HR increased 111 during mobility    Exercises Exercises: Other exercises Other Exercises Other Exercises: Pt educated re: OT role, DME  recs, falls prevention, ECS, abdominal precautions for comfort, d/c recs, pain mgmt, HEP Other Exercises: LBD, sup>sit, sit<>stand, sitting/standing balance/tolerance   Shoulder Instructions      Home Living Family/patient expects to be discharged to:: Private residence Living Arrangements: Spouse/significant other Available Help at Discharge: Family Type of Home: House Home Access:  (entry step)     Home Layout: One level     Bathroom Shower/Tub: Tub/shower unit         Home Equipment: Shower seat           Prior Functioning/Environment Level of Independence: Independent                 OT Problem List: Decreased range of motion;Pain      OT Treatment/Interventions:      OT Goals(Current goals can be found in the care plan section) Acute Rehab OT Goals Patient Stated Goal: to have nutrition consult OT Goal Formulation: With patient Time For Goal Achievement: 05/01/20 Potential to Achieve Goals: Good  OT Frequency:     Barriers to D/C:            Co-evaluation   Reason for Co-Treatment: Complexity of the patient's impairments (multi-system involvement) PT goals addressed during session: Mobility/safety with mobility        AM-PAC OT "6 Clicks" Daily Activity     Outcome Measure Help from another person eating meals?: None Help from another person taking care of personal grooming?: None Help from another person toileting, which includes using toliet, bedpan, or urinal?: None Help from another person bathing (including washing, rinsing, drying)?: None Help from another person to put on and taking off regular upper body clothing?: None Help from another person to put on and taking off regular lower body clothing?: A Little 6 Click Score: 23   End of Session    Activity Tolerance: Patient tolerated treatment well Patient left: in bed;with call bell/phone within reach;with nursing/sitter in room  OT Visit Diagnosis: Other abnormalities of gait and mobility (R26.89)                Time: 2244-9753 OT Time Calculation (min): 16 min Charges:  OT General Charges $OT Visit: 1 Visit OT Evaluation $OT Eval Low Complexity: 1 Low  Dessie Coma, M.S. OTR/L  04/17/20, 11:26 AM  ascom [phone]

## 2020-04-17 NOTE — Consult Note (Signed)
Lucilla Lame, MD [facility]  [address], [place], [place] [address] Phone: [phone] Fax : [phone]  Consultation  Referring Provider:     Dr. Verlon Au Primary Care Physician:  Danelle Berry, NP Primary Gastroenterologist:  Dr. Allen Norris         Reason for Consultation:     Gallstone pancreatitis and abdominal pain  Date of Admission:  04/16/2020 Date of Consultation:  04/17/2020         HPI:   Timothy Murray is a 75 y.o. male who was seen me back in January of this year with a history of hepatocellular carcinoma and an MRI that showed interval decrease in size of the right hepatic lobe mass.  The patient has a history of being diagnosed with hepatitis B when living in [place] although his hepatitis B surface antigen and antibody to core were both negative.  The patient was reporting left-sided abdominal pain that was worse when he ate and I had suggested he undergo a upper endoscopy.  The patient had postponed the upper endoscopy and rescheduled it.  The patient then started to have worsening abdominal pain this week and was set up for a upper endoscopy for tomorrow.  The patient was also noted to have some musculoskeletal pain on physical exam when I had seen him. Due to his abdominal pain the patient could not wait till tomorrow for his upper endoscopy so he came to the emergency room yesterday and was found to have a lipase of 6185.  This morning the lipase came down to 690.  The patient's liver enzymes were also elevated with an AST of 78 that came down to 57 this morning and an ALT of 89 which came down to 74.  The patient total bilirubin had been approximately 1.72 weeks ago and his admission bilirubin was 7.2 which came down to 6.8 this morning. The patient had a CT scan of the abdomen yesterday that showed:   IMPRESSION: 1. Acute uncomplicated pancreatitis. Specifically, no evidence of pancreatic necrosis or of an abscess/pseudocyst. No venous thrombosis. Etiology  appears due to passage of a gallstone. 2. Choledocholithiasis. Small gallstone projects in the distal common bile duct near the ampulla Vater with two other small stones noted in the adjacent second portion of the duodenum. Mild dilation of the common bile duct. There are several stones within the gallbladder. 3. 3.9 cm hypoattenuating, rim enhancing liver mass, smaller than on the prior CT, presumably the residua of treated liver carcinoma. 4. Cirrhosis, appears stable from the prior CT. 5. Aortic atherosclerosis.  I am now being asked to see the patient to consider possible ERCP due to the abnormal CT scan and what appears to be gallstone pancreatitis.  Past Medical History:  Diagnosis Date  . Arthritis    "lower back" (10/24/2012)  . Coronary artery disease   . GERD (gastroesophageal reflux disease)   . High cholesterol   . Hypertension   . Type II diabetes mellitus ([place])     Past Surgical History:  Procedure Laterality Date  . APPENDECTOMY  2002  . CARDIAC CATHETERIZATION  10/24/2012  . CORONARY ARTERY BYPASS GRAFT N/A 10/26/2012   Procedure: CORONARY ARTERY BYPASS GRAFTING (CABG);  Surgeon: Melrose Nakayama, MD;  Location: [place];  Service: Open Heart Surgery;  Laterality: N/A;  Times 3 using left internal mammary artery and endoscopically harvested right saphenous vein    Prior to Admission medications   Medication Sig Start Date End Date Taking? Authorizing Provider  aspirin EC 81 MG tablet Take 81 mg by mouth daily.   Yes [provider]  atorvastatin (LIPITOR) 40 MG tablet Take 1 tablet by mouth daily. 11/21/19  Yes [provider]  fluocinonide cream (LIDEX) 0.05 %  11/17/12  Yes [provider]  glimepiride (AMARYL) 1 MG tablet Take 1 mg by mouth daily.   Yes [provider]  glucosamine-chondroitin 500-400 MG tablet Take 1 tablet by mouth daily.   Yes [provider]  losartan (COZAAR) 25 MG tablet Take 25 mg by mouth  daily.   Yes [provider]  metoprolol tartrate (LOPRESSOR) 25 MG tablet Take 25 mg by mouth daily.   Yes [provider]  omeprazole (PRILOSEC) 20 MG capsule Take 40 mg by mouth every morning.   Yes [provider]  allopurinol (ZYLOPRIM) 100 MG tablet Take 100 mg by mouth daily. Patient not taking: No sig reported 11/26/16   [provider]  furosemide (LASIX) 40 MG tablet Take 40 mg by mouth daily. Patient not taking: No sig reported    [provider]  ibuprofen (ADVIL,MOTRIN) 600 MG tablet Take 1 tablet (600 mg total) by mouth every 6 (six) hours as needed. Patient not taking: No sig reported 06/09/15   Beers, Pierce Crane, PA-C  nitroGLYCERIN (NITROSTAT) 0.3 MG SL tablet Place under the tongue. Patient not taking: No sig reported 02/18/20   [provider]  oxyCODONE-acetaminophen (PERCOCET) 5-325 MG tablet Take 1 tablet by mouth every 4 (four) hours as needed for severe pain. Patient not taking: No sig reported 01/01/20 12/31/20  Lavonia Drafts, MD  potassium chloride SA (K-DUR,KLOR-CON) 20 MEQ tablet Take 20 mEq by mouth daily. Patient not taking: No sig reported    [provider]  saxagliptin HCl (ONGLYZA) 2.5 MG TABS tablet Take 2.5 mg by mouth daily. Patient not taking: No sig reported    [provider]  zolpidem (AMBIEN) 10 MG tablet Take 10 mg by mouth at bedtime as needed for sleep. Patient not taking: No sig reported    [provider]    Family History  Problem Relation Age of Onset  . Cancer Sister      Social History   Tobacco Use  . Smoking status: Former Smoker    Packs/day: 0.50    Years: 30.00    Pack years: 15.00    Types: Cigarettes    Quit date: 08/28/2001    Years since quitting: 18.6  . Smokeless tobacco: Never Used  Substance Use Topics  . Alcohol use: No    Alcohol/week: 0.0 standard drinks    Comment: 10/24/2012 "stopped drinking alcohol in 2003; used to drink ~ 9  bottles beer/wk"  . Drug use: No    Allergies as of 04/16/2020 - Review Complete 04/16/2020  Allergen Reaction Noted  . Other Nausea And Vomiting 01/16/2015  . Soap & cleansers  10/24/2012  . Latex Rash and Itching 10/24/2012    Review of Systems:    All systems reviewed and negative except where noted in HPI.   Physical Exam:  Vital signs in last 24 hours: Temp:  [98.4 F (36.9 C)-98.9 F (37.2 C)] 98.4 F (36.9 C) (04/14 0546) Pulse Rate:  [67-91] 76 (04/14 1039) Resp:  [14-24] 22 (04/14 1039) BP: (126-149)/(48-93) 140/53 (04/14 1039) SpO2:  [93 %-99 %] 97 % (04/14 1039) Weight:  [93 kg] 93 kg (04/13 1423)   General:   Pleasant, cooperative in NAD Head:  Normocephalic and atraumatic. Eyes:  No icterus.   Conjunctiva pink. PERRLA. Ears:  Normal auditory acuity. Neck:  Supple; no masses or thyroidomegaly Lungs: Respirations even and unlabored. Lungs clear to auscultation bilaterally.   No wheezes, crackles, or rhonchi.  Heart:  Regular rate and rhythm;  Without murmur, clicks, rubs or gallops Abdomen:  Soft, nondistended, epigastric tenderness. Normal bowel sounds. No appreciable masses or hepatomegaly.  No rebound or guarding.  Rectal:  Not performed. Msk:  Symmetrical without gross deformities.    Extremities:  Without edema, cyanosis or clubbing. Neurologic:  Alert and oriented x3;  grossly normal neurologically. Skin:  Intact without significant lesions or rashes. Cervical Nodes:  No significant cervical adenopathy. Psych:  Alert and cooperative. Normal affect.  LAB RESULTS: Recent Labs    04/16/20 1415  WBC 3.2*  HGB 9.1*  HCT 24.9*  PLT 39*   BMET Recent Labs    04/16/20 1415 04/17/20 0804  NA 138 139  K 3.2* 3.6  CL 105 105  CO2 24 27  GLUCOSE 177* 111*  BUN 11 9  CREATININE 0.95 0.84  CALCIUM 8.5* 8.5*   LFT Recent Labs    04/16/20 1415 04/17/20 0804  PROT 6.7 6.0*  ALBUMIN 3.9 3.5  AST 78* 57*  ALT 89* 74*  ALKPHOS 78 71  BILITOT  7.2* 6.8*  BILIDIR 4.5*  --   IBILI 2.7*  --    PT/INR Recent Labs    04/17/20 0804  LABPROT 15.3*  INR 1.2    STUDIES: CT ABDOMEN PELVIS W CONTRAST  Result Date: 04/16/2020 CLINICAL DATA:  Left upper quadrant abdominal pain. History of liver carcinoma. EXAM: CT ABDOMEN AND PELVIS WITH CONTRAST TECHNIQUE: Multidetector CT imaging of the abdomen and pelvis was performed using the standard protocol following bolus administration of intravenous contrast. CONTRAST:  11mL OMNIPAQUE IOHEXOL 300 MG/ML  SOLN COMPARISON:  01/01/2020 FINDINGS: Lower chest: No acute abnormality. Hepatobiliary: Well-defined hypo attenuate mass in segment 5, 3.9 x 3.1 x 3.3 cm, smaller than on the prior CT. Liver shows morphologic changes consistent with cirrhosis with central volume loss and enlargement of the lateral segment of the left lobe and caudate lobe and mild surface nodularity. No other liver masses or lesions. There are gallstones, but no gallbladder wall thickening or adjacent inflammation. Small stone projects in the distal common bile duct near the ampulla. Common bile duct is dilated to 8.5 mm. Pancreas: Hazy inflammatory change extends along the margins of the pancreas, most evident adjacent to the pancreatic head and tail, but also tracking along the duodenum with fluid attenuation noted along the anterior pararenal fascia, greater on the right. No pancreatic mass or significant duct dilation. Homogeneous pancreatic enhancement is noted. Spleen: Prominent spleen measuring 14 cm in greatest dimension. No splenic mass or focal lesion. Adrenals/Urinary Tract: No adrenal masses. Kidneys are normal in size, orientation and position with symmetric enhancement and excretion. 1.8 cm cyst, upper pole of the left kidney. Tiny adjacent low-density lesion likely also a cyst. No other renal masses or lesions. No stones. No hydronephrosis. Ureters normal in course and in caliber. Bladder is unremarkable. Stomach/Bowel: Normal  stomach. Fluid attenuation/inflammatory changes track along the duodenum. Two small stones project in the second portion of the duodenum adjacent to the anterior L. Small bowel and colon are normal in caliber. No wall thickening. No inflammation. Vascular/Lymphatic: Aortic atherosclerosis. No evidence of thrombosis of the portal vein, splenic vein or superior mesenteric vein. No enlarged lymph nodes. Reproductive: Mild prostate enlargement transverse, dimension. 5.2 cm in  greatest Other: None. Musculoskeletal: No fracture or acute finding.  No bone lesion. IMPRESSION: 1. Acute uncomplicated pancreatitis. Specifically, no evidence of pancreatic necrosis or of an abscess/pseudocyst. No venous thrombosis. Etiology appears due to passage of a gallstone. 2. Choledocholithiasis. Small gallstone projects in the distal common bile duct near the ampulla Vater with two other small stones noted in the adjacent second portion of the duodenum. Mild dilation of the common bile duct. There are several stones within the gallbladder. 3. 3.9 cm hypoattenuating, rim enhancing liver mass, smaller than on the prior CT, presumably the residua of treated liver carcinoma. 4. Cirrhosis, appears stable from the prior CT. 5. Aortic atherosclerosis. Electronically Signed   By: Lajean Manes M.D.   On: 04/16/2020 16:43      Impression / Plan:   Assessment: Active Problems:   Choledocholithiasis   Timothy Murray is a 75 y.o. y/o male with with a history of hepatocellular carcinoma with abdominal pain that got worse this week and appears to be from gallstone pancreatitis.  The patient had gallstones projected in the distal common bile duct with 2 small stones noted in the adjacent second portion of the duodenum with mild dilation of the bile duct and several stones within the gallbladder.  The patient states that his pain is better today than it was yesterday.  Plan:  The patient may have passed a stone with his quick resolution  of his gallstone pancreatitis but to be sure the patient will be set up for an MRCP for today.  I will hold off on any endoscopic procedures at this time although he was set up for an upper endoscopy for tomorrow it appears that his acute pancreatitis is the main concern at this time.  His pain is better and his lipase has come down dramatically.  Further recommendations pending his MRCP.  Thank you for involving me in the care of this patient.      LOS: 1 day   Lucilla Lame, MD, [facility] 04/17/2020, 10:52 AM,  Pager [phone] 7am-5pm  Check AMION for 5pm -7am coverage and on weekends   Note: This dictation was prepared with Dragon dictation along with smaller phrase technology. Any transcriptional errors that result from this process are unintentional.

## 2020-04-17 NOTE — Discharge Summary (Signed)
Physician Discharge Summary  Timothy Murray JSH:[id number] DOB: Jan 09, 1945 DOA: 04/16/2020  PCP: Danelle Berry, NP  Admit date: 04/16/2020 Discharge date: 04/17/2020  Time spent: 37 minutes  Recommendations for Outpatient Follow-up:  1. Needs outpatient follow-up with oncologist Dr. Mike Gip as well as gastroenterologist Dr. Allen Norris--- both have been CCed on this note 2. Will need Chem-12/CBC in 1 week 3. Nurse practitioner as an outpatient to kindly review the patient's meds-it appears he is not taking many of them  Discharge Diagnoses:  MAIN problem for hospitalization   Acute pancreatitis with rapid resolution in less than 24 hours  Please see below for itemized issues addressed in [facility]- refer to other progress notes for clarity if needed  Discharge Condition: Improved  Diet recommendation: Soft nonfatty diet for several days post discharge  Filed Weights   04/16/20 1414 04/16/20 1423  Weight: 93 kg 93 kg    History of present illness:  75 year old Filipino origin male DM II, GERD, HTN,  right common iliac artery aneurysm, AAA,  PAU s/p TEVAR,  CAD s/p 3v CABG in '14, thrombocytopenia  CT scan in March 2021 noted a 5.3cm enhancing mass in the central right lobe of the liver,   Liver biopsy confirmed moderately differentiated hepatocellular carcinoma.  PET scan noted no evidence of metastatic disease. He was offered surgical excision but decided on proton radiation 6/202--[25 sessions]  In the interim it appears he developed acute cholecystitis several times but because of his cancer in close proximity, Deemd not a candidtate for Cholecystectomy [early cholecystitis with the gallbladder wall sharing a common wall with the known [place].deemed a high risk surgical candidate]  He presented to [facility] with diffuse abdominal pain-he was scared to take his Tylenol and oxycodone for fear of making his abdominal pain worse  He was found to have a lipase of 6100  AST ALT 78/89 and total bili of 7.2 with a direct fraction of 4.5 his white count was 3.2--on CT scan he had apparent choledocholithiasis  We discussed his case with his gastroenterologist Dr. Allen Norris who saw the patient and recommended an MRI The MRI resulted and showed resolution of choledocholithiasis and probably passage of stones  I discussed in detail the case with his gastroenterologist who felt that if the patient was tolerating a diet patient could be discharged home  I discussed this option with the patient and he was agreeable and wanted to discharge-we ensured that he was able to tolerate a sandwich prior to leaving and I had a long discussion with him about eating a soft diet and nonfatty diet  His gastroenterologist is aware of his impending discharge and will schedule outpatient EGD that was planned for in the past to rule out ulcer  His hypokalemia was corrected We resumed his usual medications with some changes as per [facility]  He met criteria for discharge and I will let case manager be aware    Procedures:  MRI/MRCP IMPRESSION: 1. Low-grade peripancreatic edema may be a manifestation of prior pancreatitis. 2. Most if not all of the prior distal CBD stones appear to of cleared. On the MRCP images the distal CBD appears normal and there is no more extrahepatic biliary dilatation, although on some of the other coronal sequences there is a subtle granularity in the distal CBD which could represent residual tiny stone(s) but substantially smaller and less prominent than on recent CT. 3. Small gallstones in the gallbladder. 4. Centrally necrotic mass in segment 5 of the liver. 5. Hepatic  cirrhosis. 6.  Aortic Atherosclerosis (ICD10-I70.0). 7. Trace fluid in the right paracolic gutter.   Consultations:  Gastroenterology  Discharge Exam: Vitals:   04/17/20 1100 04/17/20 1242  BP: (!) 141/54 (!) 132/51  Pulse: 69 77  Resp:  (!) 22  Temp:    SpO2: 98% 98%    Subj  on day of d/c   Doing fair no pain tolerating liquid diet  General Exam on discharge  Awake coherent no distress EOMI NCAT no focal deficit abdomen soft no rebound no guarding ROM intact moving all 4 limbs Mild epigastric tenderness No nausea no vomiting No focal neurological deficit  Discharge Instructions   Discharge Instructions    Diet - low sodium heart healthy   Complete by: As directed    Discharge instructions   Complete by: As directed    Follow up with Dr. Allen Norris as an out-patient--I do not think he will do your test tomorrow as u have an acute pancreas issue and need for it to rest I will ask him to let you know when to reschedule Please ask your primary MD about several of the meds that you have stopped taking Get labs in 1 week Follow up with ur oncologist and primary Md as per your prior follow-ups   Increase activity slowly   Complete by: As directed      Allergies as of 04/17/2020      Reactions   Other Nausea And Vomiting   Soap & Cleansers    "liquid soap" antibacterial -rash   Latex Rash, Itching      Medication List    STOP taking these medications   allopurinol 100 MG tablet Commonly known as: ZYLOPRIM   furosemide 40 MG tablet Commonly known as: LASIX   ibuprofen 600 MG tablet Commonly known as: ADVIL   nitroGLYCERIN 0.3 MG SL tablet Commonly known as: NITROSTAT   oxyCODONE-acetaminophen 5-325 MG tablet Commonly known as: Percocet   potassium chloride SA 20 MEQ tablet Commonly known as: KLOR-CON   saxagliptin HCl 2.5 MG Tabs tablet Commonly known as: ONGLYZA     TAKE these medications   aspirin EC 81 MG tablet Take 81 mg by mouth daily.   atorvastatin 40 MG tablet Commonly known as: LIPITOR Take 1 tablet by mouth daily.   fluocinonide cream 0.05 % Commonly known as: LIDEX   glimepiride 1 MG tablet Commonly known as: AMARYL Take 1 mg by mouth daily.   glucosamine-chondroitin 500-400 MG tablet Take 1 tablet by mouth  daily.   losartan 25 MG tablet Commonly known as: COZAAR Take 25 mg by mouth daily.   metoprolol tartrate 25 MG tablet Commonly known as: LOPRESSOR Take 25 mg by mouth daily.   omeprazole 20 MG capsule Commonly known as: PRILOSEC Take 40 mg by mouth every morning.   zolpidem 10 MG tablet Commonly known as: AMBIEN Take 10 mg by mouth at bedtime as needed for sleep.      Allergies  Allergen Reactions  . Other Nausea And Vomiting  . Soap & Cleansers     "liquid soap" antibacterial -rash  . Latex Rash and Itching      The results of significant diagnostics from this hospitalization (including imaging, microbiology, ancillary and laboratory) are listed below for reference.    Significant Diagnostic Studies: CT ABDOMEN PELVIS W CONTRAST  Result Date: 04/16/2020 CLINICAL DATA:  Left upper quadrant abdominal pain. History of liver carcinoma. EXAM: CT ABDOMEN AND PELVIS WITH CONTRAST TECHNIQUE: Multidetector CT imaging of the  abdomen and pelvis was performed using the standard protocol following bolus administration of intravenous contrast. CONTRAST:  147m OMNIPAQUE IOHEXOL 300 MG/ML  SOLN COMPARISON:  01/01/2020 FINDINGS: Lower chest: No acute abnormality. Hepatobiliary: Well-defined hypo attenuate mass in segment 5, 3.9 x 3.1 x 3.3 cm, smaller than on the prior CT. Liver shows morphologic changes consistent with cirrhosis with central volume loss and enlargement of the lateral segment of the left lobe and caudate lobe and mild surface nodularity. No other liver masses or lesions. There are gallstones, but no gallbladder wall thickening or adjacent inflammation. Small stone projects in the distal common bile duct near the ampulla. Common bile duct is dilated to 8.5 mm. Pancreas: Hazy inflammatory change extends along the margins of the pancreas, most evident adjacent to the pancreatic head and tail, but also tracking along the duodenum with fluid attenuation noted along the anterior  pararenal fascia, greater on the right. No pancreatic mass or significant duct dilation. Homogeneous pancreatic enhancement is noted. Spleen: Prominent spleen measuring 14 cm in greatest dimension. No splenic mass or focal lesion. Adrenals/Urinary Tract: No adrenal masses. Kidneys are normal in size, orientation and position with symmetric enhancement and excretion. 1.8 cm cyst, upper pole of the left kidney. Tiny adjacent low-density lesion likely also a cyst. No other renal masses or lesions. No stones. No hydronephrosis. Ureters normal in course and in caliber. Bladder is unremarkable. Stomach/Bowel: Normal stomach. Fluid attenuation/inflammatory changes track along the duodenum. Two small stones project in the second portion of the duodenum adjacent to the anterior L. Small bowel and colon are normal in caliber. No wall thickening. No inflammation. Vascular/Lymphatic: Aortic atherosclerosis. No evidence of thrombosis of the portal vein, splenic vein or superior mesenteric vein. No enlarged lymph nodes. Reproductive: Mild prostate enlargement transverse, dimension. 5.2 cm in greatest Other: None. Musculoskeletal: No fracture or acute finding.  No bone lesion. IMPRESSION: 1. Acute uncomplicated pancreatitis. Specifically, no evidence of pancreatic necrosis or of an abscess/pseudocyst. No venous thrombosis. Etiology appears due to passage of a gallstone. 2. Choledocholithiasis. Small gallstone projects in the distal common bile duct near the ampulla Vater with two other small stones noted in the adjacent second portion of the duodenum. Mild dilation of the common bile duct. There are several stones within the gallbladder. 3. 3.9 cm hypoattenuating, rim enhancing liver mass, smaller than on the prior CT, presumably the residua of treated liver carcinoma. 4. Cirrhosis, appears stable from the prior CT. 5. Aortic atherosclerosis. Electronically Signed   By: DLajean ManesM.D.   On: 04/16/2020 16:43   MR 3D Recon At  Scanner  Result Date: 04/17/2020 CLINICAL DATA:  Prior gallstone pancreatitis, hepatocellular carcinoma. Abdominal pain. Choledocholithiasis on CT scan yesterday. EXAM: MRI ABDOMEN WITHOUT AND WITH CONTRAST (INCLUDING MRCP) TECHNIQUE: Multiplanar multisequence MR imaging of the abdomen was performed both before and after the administration of intravenous contrast. Heavily T2-weighted images of the biliary and pancreatic ducts were obtained, and three-dimensional MRCP images were rendered by post processing. CONTRAST:  936mGADAVIST GADOBUTROL 1 MMOL/ML IV SOLN COMPARISON:  CT abdomen 04/16/2020 FINDINGS: Lower chest: Median sternotomy. Hepatobiliary: Hepatic cirrhosis. 4.3 by 2.9 by 3.6 cm mass in segment 5 of the liver, mostly with central necrosis but with enhancing marginal thickness of up to about 0.7 cm and with some speckled potentially enhancing internal nodular elements. High T1 signal in the gallbladder. Suspected small gallstones in the gallbladder. On the 3D MRCP images, the common bile duct measures 0.5 cm in diameter (previously about 0.8  cm on yesterday CT scan) at although the dedicated MRCP images such image 33 of series 12 do not show filling defects, there is some questionable granularity in the distal CBD region for example on image 11 of series 14 which could conceivably indicate tiny residual calculi. However, these do not appear to be as prominent as the string of calculi shown on 04/16/2020 CT scan. Pancreas: Unremarkable where included. 0.4 cm cystic lesion in the junction of the pancreatic body and tail on image 14 series 14. No findings of pancreatic abscess, necrosis, or pseudocyst. Subtle peripancreatic edema. Spleen:  Unremarkable Adrenals/Urinary Tract: Simple appearing left kidney upper pole cyst. Adrenal glands unremarkable. Stomach/Bowel: Unremarkable Vascular/Lymphatic:  Aortoiliac atherosclerotic vascular disease. Other:  Trace fluid in the right paracolic gutter. Musculoskeletal:  Unremarkable IMPRESSION: 1. Low-grade peripancreatic edema may be a manifestation of prior pancreatitis. 2. Most if not all of the prior distal CBD stones appear to of cleared. On the MRCP images the distal CBD appears normal and there is no more extrahepatic biliary dilatation, although on some of the other coronal sequences there is a subtle granularity in the distal CBD which could represent residual tiny stone(s) but substantially smaller and less prominent than on recent CT. 3. Small gallstones in the gallbladder. 4. Centrally necrotic mass in segment 5 of the liver. 5. Hepatic cirrhosis. 6.  Aortic Atherosclerosis (ICD10-I70.0). 7. Trace fluid in the right paracolic gutter. Electronically Signed   By: Van Clines M.D.   On: 04/17/2020 13:00   MR ABDOMEN MRCP W WO CONTAST  Result Date: 04/17/2020 CLINICAL DATA:  Prior gallstone pancreatitis, hepatocellular carcinoma. Abdominal pain. Choledocholithiasis on CT scan yesterday. EXAM: MRI ABDOMEN WITHOUT AND WITH CONTRAST (INCLUDING MRCP) TECHNIQUE: Multiplanar multisequence MR imaging of the abdomen was performed both before and after the administration of intravenous contrast. Heavily T2-weighted images of the biliary and pancreatic ducts were obtained, and three-dimensional MRCP images were rendered by post processing. CONTRAST:  77m GADAVIST GADOBUTROL 1 MMOL/ML IV SOLN COMPARISON:  CT abdomen 04/16/2020 FINDINGS: Lower chest: Median sternotomy. Hepatobiliary: Hepatic cirrhosis. 4.3 by 2.9 by 3.6 cm mass in segment 5 of the liver, mostly with central necrosis but with enhancing marginal thickness of up to about 0.7 cm and with some speckled potentially enhancing internal nodular elements. High T1 signal in the gallbladder. Suspected small gallstones in the gallbladder. On the 3D MRCP images, the common bile duct measures 0.5 cm in diameter (previously about 0.8 cm on yesterday CT scan) at although the dedicated MRCP images such image 33 of series 12  do not show filling defects, there is some questionable granularity in the distal CBD region for example on image 11 of series 14 which could conceivably indicate tiny residual calculi. However, these do not appear to be as prominent as the string of calculi shown on 04/16/2020 CT scan. Pancreas: Unremarkable where included. 0.4 cm cystic lesion in the junction of the pancreatic body and tail on image 14 series 14. No findings of pancreatic abscess, necrosis, or pseudocyst. Subtle peripancreatic edema. Spleen:  Unremarkable Adrenals/Urinary Tract: Simple appearing left kidney upper pole cyst. Adrenal glands unremarkable. Stomach/Bowel: Unremarkable Vascular/Lymphatic:  Aortoiliac atherosclerotic vascular disease. Other:  Trace fluid in the right paracolic gutter. Musculoskeletal: Unremarkable IMPRESSION: 1. Low-grade peripancreatic edema may be a manifestation of prior pancreatitis. 2. Most if not all of the prior distal CBD stones appear to of cleared. On the MRCP images the distal CBD appears normal and there is no more extrahepatic biliary dilatation, although on  some of the other coronal sequences there is a subtle granularity in the distal CBD which could represent residual tiny stone(s) but substantially smaller and less prominent than on recent CT. 3. Small gallstones in the gallbladder. 4. Centrally necrotic mass in segment 5 of the liver. 5. Hepatic cirrhosis. 6.  Aortic Atherosclerosis (ICD10-I70.0). 7. Trace fluid in the right paracolic gutter. Electronically Signed   By: Van Clines M.D.   On: 04/17/2020 13:00    Microbiology: Recent Results (from the past 240 hour(s))  SARS CORONAVIRUS 2 (TAT 6-24 HRS) Nasopharyngeal Nasopharyngeal Swab     Status: None   Collection Time: 04/16/20  8:20 PM   Specimen: Nasopharyngeal Swab  Result Value Ref Range Status   SARS Coronavirus 2 NEGATIVE NEGATIVE Final    Comment: (NOTE) SARS-CoV-2 target nucleic acids are NOT DETECTED.  The SARS-CoV-2 RNA  is generally detectable in upper and lower respiratory specimens during the acute phase of infection. Negative results do not preclude SARS-CoV-2 infection, do not rule out co-infections with other pathogens, and should not be used as the sole basis for treatment or other patient management decisions. Negative results must be combined with clinical observations, patient history, and epidemiological information. The expected result is Negative.  Fact Sheet for Patients: SugarRoll.be  Fact Sheet for Healthcare Providers: https://www.woods-mathews.com/  This test is not yet approved or cleared by the [place] FDA and  has been authorized for detection and/or diagnosis of SARS-CoV-2 by FDA under an Emergency Use Authorization (EUA). This EUA will remain  in effect (meaning this test can be used) for the duration of the COVID-19 declaration under Se ction 564(b)(1) of the Act, 21 U.S.C. section 360bbb-3(b)(1), unless the authorization is terminated or revoked sooner.  Performed at [facility], [place] [address], [place], [place] [address]      Labs: Basic Metabolic Panel: Recent Labs  Lab 04/16/20 1415 04/17/20 0804  NA 138 139  K 3.2* 3.6  CL 105 105  CO2 24 27  GLUCOSE 177* 111*  BUN 11 9  CREATININE 0.95 0.84  CALCIUM 8.5* 8.5*  MG  --  2.2   Liver Function Tests: Recent Labs  Lab 04/16/20 1415 04/17/20 0804  AST 78* 57*  ALT 89* 74*  ALKPHOS 78 71  BILITOT 7.2* 6.8*  PROT 6.7 6.0*  ALBUMIN 3.9 3.5   Recent Labs  Lab 04/16/20 1415 04/17/20 0804  LIPASE 6,185* 690*   Recent Labs  Lab 04/17/20 0804  AMMONIA 12   CBC: Recent Labs  Lab 04/16/20 1415  WBC 3.2*  HGB 9.1*  HCT 24.9*  MCV 95.8  PLT 39*   Cardiac Enzymes: No results for input(s): CKTOTAL, CKMB, CKMBINDEX, TROPONINI in the last 168 hours. BNP: BNP (last 3 results) No results for input(s): BNP in the last 8760 hours.  ProBNP (last 3  results) No results for input(s): PROBNP in the last 8760 hours.  CBG: Recent Labs  Lab 04/17/20 0105 04/17/20 0455 04/17/20 0726 04/17/20 1230 04/17/20 1358  GLUCAP 66* 82 97 84 96       Signed:  Nita Sells MD   [facility] 04/17/2020, 3:55 PM

## 2020-04-18 ENCOUNTER — Telehealth: Payer: Self-pay

## 2020-04-18 NOTE — Telephone Encounter (Signed)
Pt aware of date and time of BM bx and expressed understanding.

## 2020-04-18 NOTE — Telephone Encounter (Signed)
Scheduled for CT Bone Marrow Bx Tues 04/22/20 at 9:30a Arrive at 8:30a.  -Tried calling patient to give the information above and he did not answer. LVM to call me back. I will also try to call him back later today.

## 2020-04-18 NOTE — Telephone Encounter (Signed)
Error

## 2020-04-21 NOTE — Progress Notes (Signed)
Patient on schedule for BMB 4/19, called and spoke with patient. Made aware to be here @0830 , NPO after MN as well as driver post procedure/discharge. Stated understanding.

## 2020-04-22 ENCOUNTER — Ambulatory Visit
Admission: RE | Admit: 2020-04-22 | Discharge: 2020-04-22 | Disposition: A | Discharge status: Home / Self Care | Payer: Medicare HMO | Source: Ambulatory Visit | Attending: Hematology and Oncology | Admitting: Hematology and Oncology

## 2020-04-22 ENCOUNTER — Other Ambulatory Visit: Payer: Self-pay

## 2020-04-22 ENCOUNTER — Other Ambulatory Visit: Payer: Self-pay | Admitting: Radiology

## 2020-04-22 DIAGNOSIS — D61818 Other pancytopenia: Secondary | ICD-10-CM | POA: Insufficient documentation

## 2020-04-22 DIAGNOSIS — Z87891 Personal history of nicotine dependence: Secondary | ICD-10-CM | POA: Insufficient documentation

## 2020-04-22 DIAGNOSIS — D696 Thrombocytopenia, unspecified: Secondary | ICD-10-CM

## 2020-04-22 DIAGNOSIS — Z8505 Personal history of malignant neoplasm of liver: Secondary | ICD-10-CM | POA: Insufficient documentation

## 2020-04-22 DIAGNOSIS — K851 Biliary acute pancreatitis without necrosis or infection: Secondary | ICD-10-CM | POA: Diagnosis not present

## 2020-04-22 DIAGNOSIS — D649 Anemia, unspecified: Secondary | ICD-10-CM | POA: Insufficient documentation

## 2020-04-22 DIAGNOSIS — K859 Acute pancreatitis without necrosis or infection, unspecified: Secondary | ICD-10-CM | POA: Diagnosis not present

## 2020-04-22 LAB — CBC WITH DIFFERENTIAL/PLATELET
Abs Immature Granulocytes: 0.01 10*3/uL (ref 0.00–0.07)
Basophils Absolute: 0 10*3/uL (ref 0.0–0.1)
Basophils Relative: 1 %
Eosinophils Absolute: 0 10*3/uL (ref 0.0–0.5)
Eosinophils Relative: 1 %
HCT: 25 % — ABNORMAL LOW (ref 39.0–52.0)
Hemoglobin: 8.8 g/dL — ABNORMAL LOW (ref 13.0–17.0)
Immature Granulocytes: 1 %
Lymphocytes Relative: 30 %
Lymphs Abs: 0.6 10*3/uL — ABNORMAL LOW (ref 0.7–4.0)
MCH: 34.5 pg — ABNORMAL HIGH (ref 26.0–34.0)
MCHC: 35.2 g/dL (ref 30.0–36.0)
MCV: 98 fL (ref 80.0–100.0)
Monocytes Absolute: 0.2 10*3/uL (ref 0.1–1.0)
Monocytes Relative: 9 %
Neutro Abs: 1.3 10*3/uL — ABNORMAL LOW (ref 1.7–7.7)
Neutrophils Relative %: 58 %
Platelets: 33 10*3/uL — ABNORMAL LOW (ref 150–400)
RBC: 2.55 MIL/uL — ABNORMAL LOW (ref 4.22–5.81)
RDW: 16.4 % — ABNORMAL HIGH (ref 11.5–15.5)
WBC: 2.1 10*3/uL — ABNORMAL LOW (ref 4.0–10.5)
nRBC: 0 % (ref 0.0–0.2)

## 2020-04-22 LAB — GLUCOSE, CAPILLARY
Glucose-Capillary: 131 mg/dL — ABNORMAL HIGH (ref 70–99)
Glucose-Capillary: 121 mg/dL — ABNORMAL HIGH (ref 70–99)

## 2020-04-22 IMAGING — CT CT BIOPSY AND ASPIRATION BONE MARROW
1 of 2 series · 10 of 14 positions shown, 13 images · non-contrast
Comparison: none

CLINICAL DATA: Thrombocytopenia. Possible ITP. History of
hepatocellular carcinoma.

[Series 2: i-spiral 5.0 b30f · axial · 0.68mm/px · z∈[-143,-48]mm · 10 of 34 slices shown, 13 images]
[im 4/34  soft-tissue]
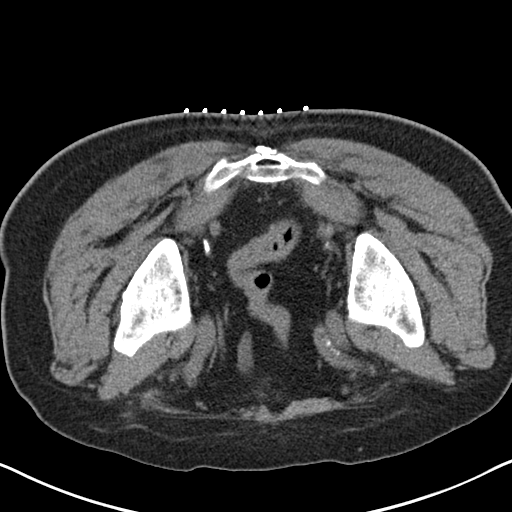
[im 4/34  bone]
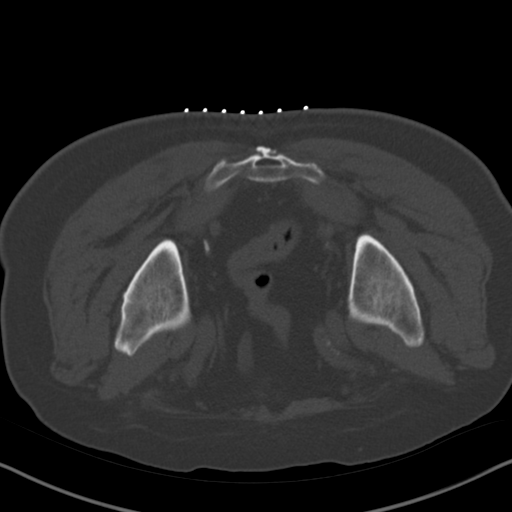
[im 7/34  bone]
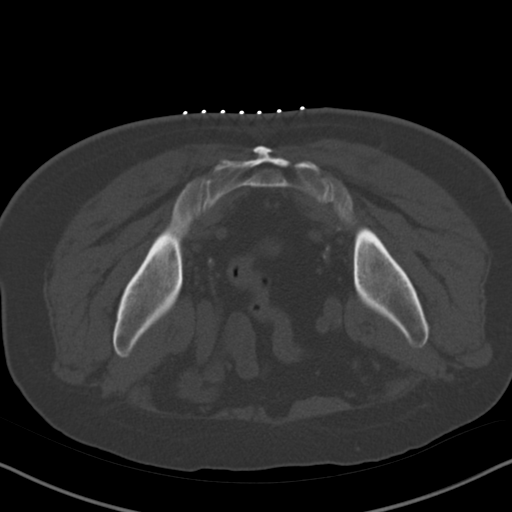
[im 10/34  bone]
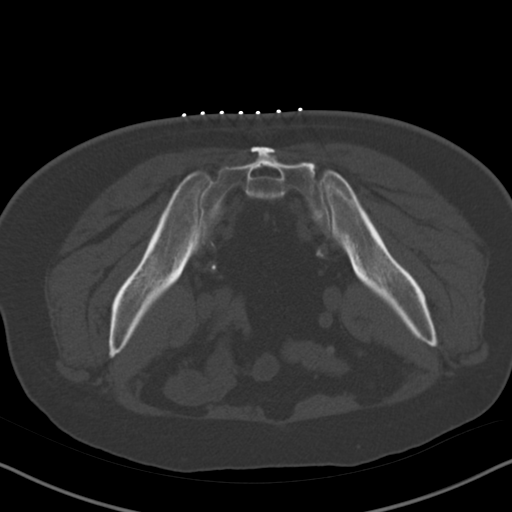
[im 13/34  bone]
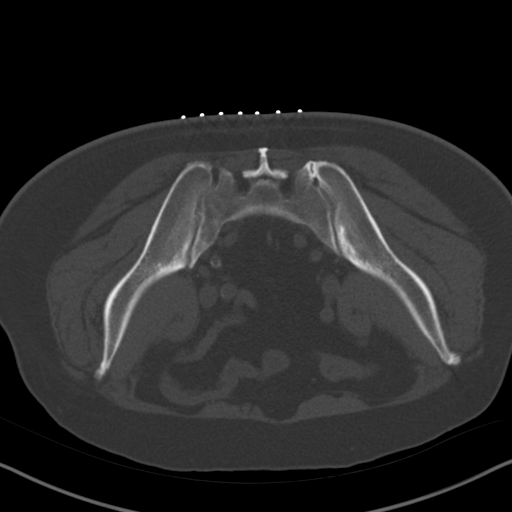
[im 16/34  soft-tissue]
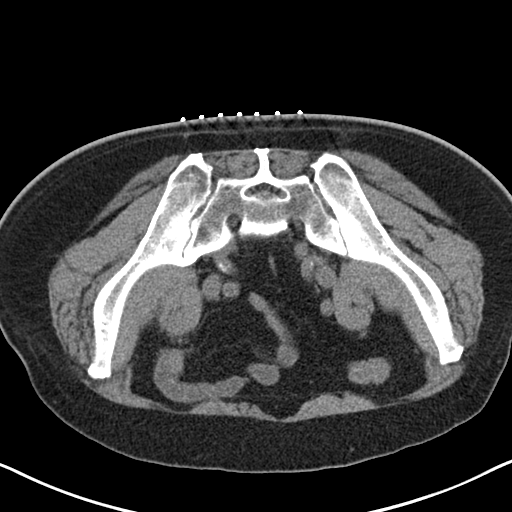
[im 16/34  bone]
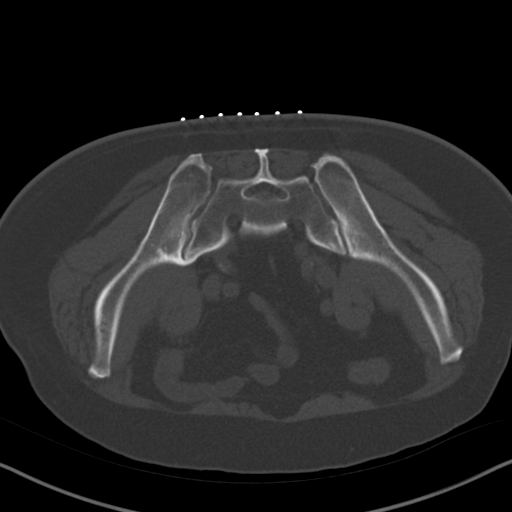
[im 19/34  bone]
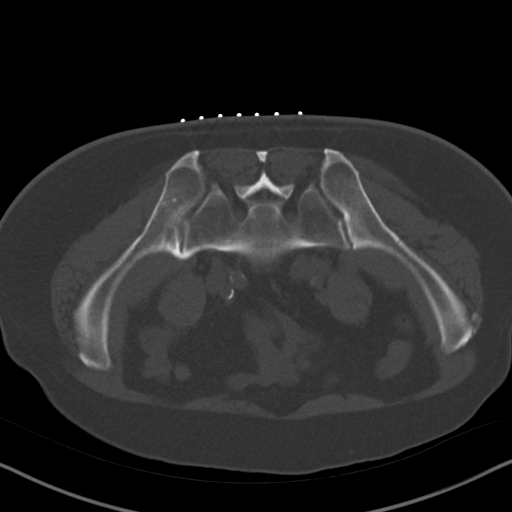
[im 22/34  bone]
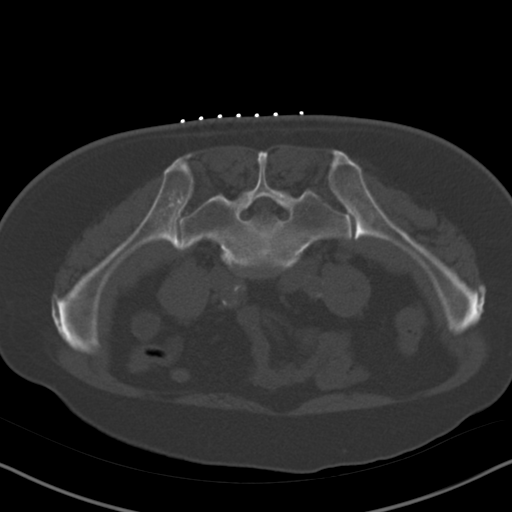
[im 25/34  bone]
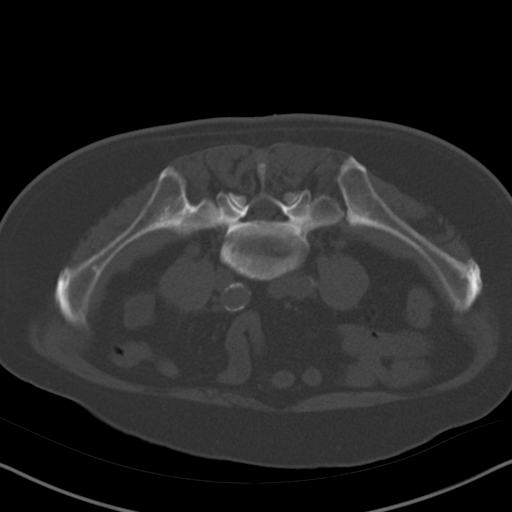
[im 28/34  soft-tissue]
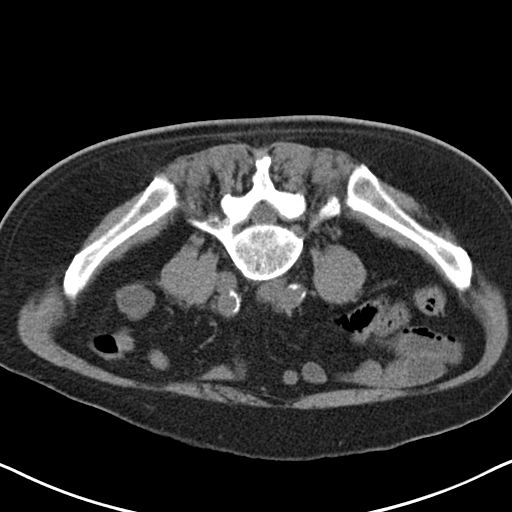
[im 28/34  bone]
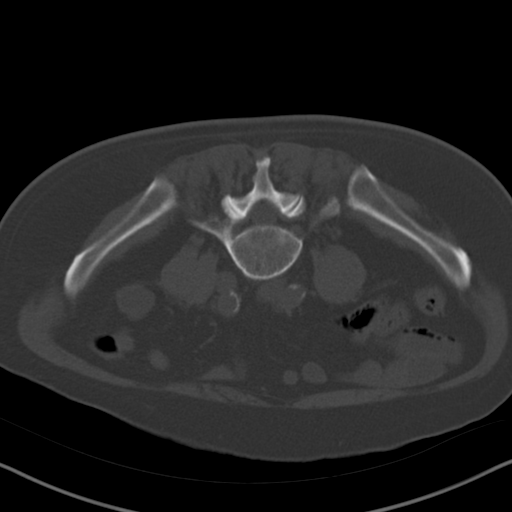
[im 31/34  bone]
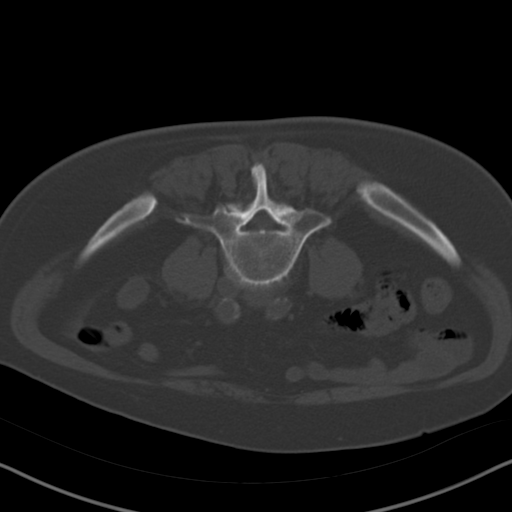

[10 of 14 positions shown; findings below may reference images not displayed]

EXAM:
CT GUIDED BONE MARROW ASPIRATION AND BIOPSY

ANESTHESIA/SEDATION:
Versed 1.0 mg IV, Fentanyl 50 mcg IV

Total Moderate Sedation Time:   15 minutes.

The patient's level of consciousness and physiologic status were
continuously monitored during the procedure by Radiology nursing.

PROCEDURE:
The procedure risks, benefits, and alternatives were explained to
the patient. Questions regarding the procedure were encouraged and
answered. The patient understands and consents to the procedure. A
time out was performed prior to initiating the procedure.

The right gluteal region was prepped with chlorhexidine. Sterile
gown and sterile gloves were used for the procedure. Local
anesthesia was provided with 1% Lidocaine.

Under CT guidance, an 11 gauge On Control bone cutting needle was
advanced from a posterior approach into the right iliac bone. Needle
positioning was confirmed with CT. Initial non heparinized and
heparinized aspirate samples were obtained of bone marrow. Core
biopsy was performed via the On Control drill needle.

COMPLICATIONS:
None
FINDINGS: Inspection of initial aspirate did reveal visible particles. Intact
core biopsy sample was obtained.
IMPRESSION: CT guided bone marrow biopsy of right posterior iliac bone with both
aspirate and core samples obtained.

## 2020-04-22 MED ORDER — FENTANYL CITRATE (PF) 100 MCG/2ML IJ SOLN
INTRAMUSCULAR | Status: AC | PRN
  Administered 2020-04-22: 50 ug via INTRAVENOUS

## 2020-04-22 MED ORDER — SODIUM CHLORIDE 0.9 % IV SOLN: INTRAVENOUS | Status: DC

## 2020-04-22 MED ORDER — MIDAZOLAM HCL 2 MG/2ML IJ SOLN
INTRAMUSCULAR | Status: AC | PRN
  Administered 2020-04-22: 1 mg via INTRAVENOUS

## 2020-04-22 MED ORDER — HEPARIN SOD (PORK) LOCK FLUSH 100 UNIT/ML IV SOLN
INTRAVENOUS | Status: AC
  Filled 2020-04-22: qty 5

## 2020-04-22 MED ORDER — MIDAZOLAM HCL 2 MG/2ML IJ SOLN
INTRAMUSCULAR | Status: AC
  Filled 2020-04-22: qty 2

## 2020-04-22 MED ORDER — FENTANYL CITRATE (PF) 100 MCG/2ML IJ SOLN
INTRAMUSCULAR | Status: AC
  Filled 2020-04-22: qty 2

## 2020-04-22 NOTE — Procedures (Signed)
Interventional Radiology Procedure Note  Procedure: CT guided bone marrow aspiration and biopsy  Complications: None  EBL: < 10 mL  Findings: Aspirate and core biopsy performed of bone marrow in right iliac bone.  Plan: Bedrest supine x 1 hrs  Teva Bronkema T. Taliya Mcclard, M.D Pager:  [phone]   

## 2020-04-22 NOTE — H&P (Signed)
Chief Complaint: Patient was seen in consultation today for bone marrow biopsy at the request of [place] [facility]  Referring Physician(s): [place] [facility]  Patient Status: [facility] - Out-pt  History of Present Illness: Timothy Murray is a 75 y.o. male with a history of treated hepatocellular carcinoma with persistent thrombocytopenia and possible ITP. Recommended he have a bone marrow biopsy by Dr. Mike Gip for further hematologic work up.   Past Medical History:  Diagnosis Date  . Arthritis    "lower back" (10/24/2012)  . Coronary artery disease   . GERD (gastroesophageal reflux disease)   . High cholesterol   . Hypertension   . Liver cancer ([place])   . Type II diabetes mellitus ([place])     Past Surgical History:  Procedure Laterality Date  . APPENDECTOMY  2002  . CARDIAC CATHETERIZATION  10/24/2012  . CORONARY ARTERY BYPASS GRAFT N/A 10/26/2012   Procedure: CORONARY ARTERY BYPASS GRAFTING (CABG);  Surgeon: Melrose Nakayama, MD;  Location: [place];  Service: Open Heart Surgery;  Laterality: N/A;  Times 3 using left internal mammary artery and endoscopically harvested right saphenous vein    Allergies: Other, Soap & cleansers, and Latex  Medications: Prior to Admission medications   Medication Sig Start Date End Date Taking? Authorizing Provider  aspirin EC 81 MG tablet Take 81 mg by mouth daily.   Yes [provider]  atorvastatin (LIPITOR) 40 MG tablet Take 1 tablet by mouth daily. 11/21/19  Yes [provider]  fluocinonide cream (LIDEX) 0.05 %  11/17/12  Yes [provider]  glimepiride (AMARYL) 1 MG tablet Take 1 mg by mouth daily.   Yes [provider]  glucosamine-chondroitin 500-400 MG tablet Take 1 tablet by mouth daily.   Yes [provider]  losartan (COZAAR) 25 MG tablet Take 25 mg by mouth daily.   Yes [provider]  metoprolol tartrate (LOPRESSOR) 25 MG tablet Take 25 mg by mouth daily.   Yes  [provider]  omeprazole (PRILOSEC) 20 MG capsule Take 40 mg by mouth every morning.   Yes [provider]  zolpidem (AMBIEN) 10 MG tablet Take 10 mg by mouth at bedtime as needed for sleep. Patient not taking: No sig reported    [provider]     Family History  Problem Relation Age of Onset  . Cancer Sister     Social History   Socioeconomic History  . Marital status: Married    Spouse name: Not on file  . Number of children: Not on file  . Years of education: Not on file  . Highest education level: Not on file  Occupational History  . Not on file  Tobacco Use  . Smoking status: Former Smoker    Packs/day: 0.50    Years: 30.00    Pack years: 15.00    Types: Cigarettes    Quit date: 08/28/2001    Years since quitting: 18.6  . Smokeless tobacco: Never Used  Vaping Use  . Vaping Use: Not on file  Substance and Sexual Activity  . Alcohol use: No    Alcohol/week: 0.0 standard drinks    Comment: 10/24/2012 "stopped drinking alcohol in 2003; used to drink ~ 9 bottles beer/wk"  . Drug use: No  . Sexual activity: Yes  Other Topics Concern  . Not on file  Social History Narrative  . Not on file   Social Determinants of Health   Financial Resource Strain: Not on file  Food Insecurity: Not on file  Transportation Needs: Not on file  Physical Activity: Not on file  Stress: Not on file  Social Connections: Not on file    ECOG Status: 0 - Asymptomatic  Review of Systems: A 12 point ROS discussed and pertinent positives are indicated in the HPI above.  All other systems are negative.  Review of Systems  Constitutional: Negative.   Respiratory: Negative.   Cardiovascular: Negative.   Gastrointestinal: Negative.   Genitourinary: Negative.   Musculoskeletal: Negative.   Skin: Negative.   Neurological: Negative.   Hematological: Negative.     Vital Signs: BP (!) 121/56   Pulse 62   Temp 98.2 F (36.8 C) (Oral)   Resp 16   Ht 6'  (1.829 m)   Wt 93 kg   SpO2 100%   BMI 27.80 kg/m   Physical Exam Vitals reviewed.  Constitutional:      General: He is not in acute distress.    Appearance: Normal appearance. He is not ill-appearing, toxic-appearing or diaphoretic.  Cardiovascular:     Rate and Rhythm: Normal rate and regular rhythm.     Heart sounds: Normal heart sounds. No murmur heard. No friction rub. No gallop.   Pulmonary:     Effort: Pulmonary effort is normal. No respiratory distress.     Breath sounds: Normal breath sounds. No stridor. No wheezing, rhonchi or rales.  Abdominal:     General: There is no distension.     Palpations: Abdomen is soft.     Tenderness: There is no abdominal tenderness. There is no guarding or rebound.  Musculoskeletal:        General: Swelling present.     Cervical back: Neck supple.     Comments: Mild pretibial edema in both lower legs.  Skin:    General: Skin is warm and dry.     Coloration: Skin is not jaundiced.  Neurological:     General: No focal deficit present.     Mental Status: He is alert and oriented to person, place, and time.     Imaging: CT ABDOMEN PELVIS W CONTRAST  Result Date: 04/16/2020 CLINICAL DATA:  Left upper quadrant abdominal pain. History of liver carcinoma. EXAM: CT ABDOMEN AND PELVIS WITH CONTRAST TECHNIQUE: Multidetector CT imaging of the abdomen and pelvis was performed using the standard protocol following bolus administration of intravenous contrast. CONTRAST:  153m OMNIPAQUE IOHEXOL 300 MG/ML  SOLN COMPARISON:  01/01/2020 FINDINGS: Lower chest: No acute abnormality. Hepatobiliary: Well-defined hypo attenuate mass in segment 5, 3.9 x 3.1 x 3.3 cm, smaller than on the prior CT. Liver shows morphologic changes consistent with cirrhosis with central volume loss and enlargement of the lateral segment of the left lobe and caudate lobe and mild surface nodularity. No other liver masses or lesions. There are gallstones, but no gallbladder wall  thickening or adjacent inflammation. Small stone projects in the distal common bile duct near the ampulla. Common bile duct is dilated to 8.5 mm. Pancreas: Hazy inflammatory change extends along the margins of the pancreas, most evident adjacent to the pancreatic head and tail, but also tracking along the duodenum with fluid attenuation noted along the anterior pararenal fascia, greater on the right. No pancreatic mass or significant duct dilation. Homogeneous pancreatic enhancement is noted. Spleen: Prominent spleen measuring 14 cm in greatest dimension. No splenic mass or focal lesion. Adrenals/Urinary Tract: No adrenal masses. Kidneys are normal in size, orientation and position with symmetric enhancement and excretion. 1.8 cm cyst, upper  pole of the left kidney. Tiny adjacent low-density lesion likely also a cyst. No other renal masses or lesions. No stones. No hydronephrosis. Ureters normal in course and in caliber. Bladder is unremarkable. Stomach/Bowel: Normal stomach. Fluid attenuation/inflammatory changes track along the duodenum. Two small stones project in the second portion of the duodenum adjacent to the anterior L. Small bowel and colon are normal in caliber. No wall thickening. No inflammation. Vascular/Lymphatic: Aortic atherosclerosis. No evidence of thrombosis of the portal vein, splenic vein or superior mesenteric vein. No enlarged lymph nodes. Reproductive: Mild prostate enlargement transverse, dimension. 5.2 cm in greatest Other: None. Musculoskeletal: No fracture or acute finding.  No bone lesion. IMPRESSION: 1. Acute uncomplicated pancreatitis. Specifically, no evidence of pancreatic necrosis or of an abscess/pseudocyst. No venous thrombosis. Etiology appears due to passage of a gallstone. 2. Choledocholithiasis. Small gallstone projects in the distal common bile duct near the ampulla Vater with two other small stones noted in the adjacent second portion of the duodenum. Mild dilation of the  common bile duct. There are several stones within the gallbladder. 3. 3.9 cm hypoattenuating, rim enhancing liver mass, smaller than on the prior CT, presumably the residua of treated liver carcinoma. 4. Cirrhosis, appears stable from the prior CT. 5. Aortic atherosclerosis. Electronically Signed   By: Lajean Manes M.D.   On: 04/16/2020 16:43   MR 3D Recon At Scanner  Result Date: 04/17/2020 CLINICAL DATA:  Prior gallstone pancreatitis, hepatocellular carcinoma. Abdominal pain. Choledocholithiasis on CT scan yesterday. EXAM: MRI ABDOMEN WITHOUT AND WITH CONTRAST (INCLUDING MRCP) TECHNIQUE: Multiplanar multisequence MR imaging of the abdomen was performed both before and after the administration of intravenous contrast. Heavily T2-weighted images of the biliary and pancreatic ducts were obtained, and three-dimensional MRCP images were rendered by post processing. CONTRAST:  3m GADAVIST GADOBUTROL 1 MMOL/ML IV SOLN COMPARISON:  CT abdomen 04/16/2020 FINDINGS: Lower chest: Median sternotomy. Hepatobiliary: Hepatic cirrhosis. 4.3 by 2.9 by 3.6 cm mass in segment 5 of the liver, mostly with central necrosis but with enhancing marginal thickness of up to about 0.7 cm and with some speckled potentially enhancing internal nodular elements. High T1 signal in the gallbladder. Suspected small gallstones in the gallbladder. On the 3D MRCP images, the common bile duct measures 0.5 cm in diameter (previously about 0.8 cm on yesterday CT scan) at although the dedicated MRCP images such image 33 of series 12 do not show filling defects, there is some questionable granularity in the distal CBD region for example on image 11 of series 14 which could conceivably indicate tiny residual calculi. However, these do not appear to be as prominent as the string of calculi shown on 04/16/2020 CT scan. Pancreas: Unremarkable where included. 0.4 cm cystic lesion in the junction of the pancreatic body and tail on image 14 series 14. No  findings of pancreatic abscess, necrosis, or pseudocyst. Subtle peripancreatic edema. Spleen:  Unremarkable Adrenals/Urinary Tract: Simple appearing left kidney upper pole cyst. Adrenal glands unremarkable. Stomach/Bowel: Unremarkable Vascular/Lymphatic:  Aortoiliac atherosclerotic vascular disease. Other:  Trace fluid in the right paracolic gutter. Musculoskeletal: Unremarkable IMPRESSION: 1. Low-grade peripancreatic edema may be a manifestation of prior pancreatitis. 2. Most if not all of the prior distal CBD stones appear to of cleared. On the MRCP images the distal CBD appears normal and there is no more extrahepatic biliary dilatation, although on some of the other coronal sequences there is a subtle granularity in the distal CBD which could represent residual tiny stone(s) but substantially smaller and less prominent  than on recent CT. 3. Small gallstones in the gallbladder. 4. Centrally necrotic mass in segment 5 of the liver. 5. Hepatic cirrhosis. 6.  Aortic Atherosclerosis (ICD10-I70.0). 7. Trace fluid in the right paracolic gutter. Electronically Signed   By: Van Clines M.D.   On: 04/17/2020 13:00   MR ABDOMEN MRCP W WO CONTAST  Result Date: 04/17/2020 CLINICAL DATA:  Prior gallstone pancreatitis, hepatocellular carcinoma. Abdominal pain. Choledocholithiasis on CT scan yesterday. EXAM: MRI ABDOMEN WITHOUT AND WITH CONTRAST (INCLUDING MRCP) TECHNIQUE: Multiplanar multisequence MR imaging of the abdomen was performed both before and after the administration of intravenous contrast. Heavily T2-weighted images of the biliary and pancreatic ducts were obtained, and three-dimensional MRCP images were rendered by post processing. CONTRAST:  53m GADAVIST GADOBUTROL 1 MMOL/ML IV SOLN COMPARISON:  CT abdomen 04/16/2020 FINDINGS: Lower chest: Median sternotomy. Hepatobiliary: Hepatic cirrhosis. 4.3 by 2.9 by 3.6 cm mass in segment 5 of the liver, mostly with central necrosis but with enhancing marginal  thickness of up to about 0.7 cm and with some speckled potentially enhancing internal nodular elements. High T1 signal in the gallbladder. Suspected small gallstones in the gallbladder. On the 3D MRCP images, the common bile duct measures 0.5 cm in diameter (previously about 0.8 cm on yesterday CT scan) at although the dedicated MRCP images such image 33 of series 12 do not show filling defects, there is some questionable granularity in the distal CBD region for example on image 11 of series 14 which could conceivably indicate tiny residual calculi. However, these do not appear to be as prominent as the string of calculi shown on 04/16/2020 CT scan. Pancreas: Unremarkable where included. 0.4 cm cystic lesion in the junction of the pancreatic body and tail on image 14 series 14. No findings of pancreatic abscess, necrosis, or pseudocyst. Subtle peripancreatic edema. Spleen:  Unremarkable Adrenals/Urinary Tract: Simple appearing left kidney upper pole cyst. Adrenal glands unremarkable. Stomach/Bowel: Unremarkable Vascular/Lymphatic:  Aortoiliac atherosclerotic vascular disease. Other:  Trace fluid in the right paracolic gutter. Musculoskeletal: Unremarkable IMPRESSION: 1. Low-grade peripancreatic edema may be a manifestation of prior pancreatitis. 2. Most if not all of the prior distal CBD stones appear to of cleared. On the MRCP images the distal CBD appears normal and there is no more extrahepatic biliary dilatation, although on some of the other coronal sequences there is a subtle granularity in the distal CBD which could represent residual tiny stone(s) but substantially smaller and less prominent than on recent CT. 3. Small gallstones in the gallbladder. 4. Centrally necrotic mass in segment 5 of the liver. 5. Hepatic cirrhosis. 6.  Aortic Atherosclerosis (ICD10-I70.0). 7. Trace fluid in the right paracolic gutter. Electronically Signed   By: WVan ClinesM.D.   On: 04/17/2020 13:00     Labs:  CBC: Recent Labs    03/31/20 1323 04/03/20 1117 04/16/20 1415 04/22/20 0900  WBC 3.4* 3.2* 3.2* 2.1*  HGB 10.5* 10.6* 9.1* 8.8*  HCT 29.9* 29.6* 24.9* 25.0*  PLT 53* 49* 39* 33*    COAGS: Recent Labs    04/17/20 0804  INR 1.2    BMP: Recent Labs    01/01/20 0250 03/31/20 1323 04/16/20 1415 04/17/20 0804  NA 136 137 138 139  K 3.6 3.7 3.2* 3.6  CL 100 105 105 105  CO2 26 25 24 27   GLUCOSE 191* 147* 177* 111*  BUN 12 13 11 9   CALCIUM 8.8* 8.7* 8.5* 8.5*  CREATININE 1.00 1.11 0.95 0.84  GFRNONAA >60 >60 >60 >60  LIVER FUNCTION TESTS: Recent Labs    01/01/20 0250 03/31/20 1323 04/16/20 1415 04/17/20 0804  BILITOT 1.5* 1.7* 7.2* 6.8*  AST 36 25 78* 57*  ALT 42 38 89* 74*  ALKPHOS 73 58 78 71  PROT 7.8 6.9 6.7 6.0*  ALBUMIN 4.6 4.1 3.9 3.5    Assessment and Plan:  For CT guided bone marrow biopsy today. Risks and benefits of bone marrow biopsy was discussed with the patient and/or patient's family including, but not limited to bleeding, infection, damage to adjacent structures or low yield requiring additional tests. All of the questions were answered and there is agreement to proceed. Consent signed and in chart.  Thank you for this interesting consult.  I greatly enjoyed meeting [facility] and look forward to participating in their care.  A copy of this report was sent to the requesting provider on this date.  Electronically Signed: Azzie Roup, MD 04/22/2020, 9:34 AM     I spent a total of  15 Minutes in face to face in clinical consultation, greater than 50% of which was counseling/coordinating care for bone marrow biopsy.

## 2020-04-24 ENCOUNTER — Inpatient Hospital Stay: Payer: Medicare HMO

## 2020-04-24 ENCOUNTER — Other Ambulatory Visit: Payer: Self-pay

## 2020-04-24 ENCOUNTER — Emergency Department: Payer: Medicare HMO

## 2020-04-24 ENCOUNTER — Inpatient Hospital Stay
Admission: EM | Admit: 2020-04-24 | Discharge: 2020-04-26 | DRG: 439 | Disposition: A | Discharge status: Home / Self Care | Payer: Medicare HMO | Attending: Internal Medicine | Admitting: Internal Medicine

## 2020-04-24 DIAGNOSIS — Z923 Personal history of irradiation: Secondary | ICD-10-CM | POA: Diagnosis not present

## 2020-04-24 DIAGNOSIS — M47816 Spondylosis without myelopathy or radiculopathy, lumbar region: Secondary | ICD-10-CM | POA: Diagnosis present

## 2020-04-24 DIAGNOSIS — Z87891 Personal history of nicotine dependence: Secondary | ICD-10-CM

## 2020-04-24 DIAGNOSIS — K219 Gastro-esophageal reflux disease without esophagitis: Secondary | ICD-10-CM | POA: Diagnosis present

## 2020-04-24 DIAGNOSIS — I2510 Atherosclerotic heart disease of native coronary artery without angina pectoris: Secondary | ICD-10-CM | POA: Diagnosis present

## 2020-04-24 DIAGNOSIS — Z809 Family history of malignant neoplasm, unspecified: Secondary | ICD-10-CM | POA: Diagnosis not present

## 2020-04-24 DIAGNOSIS — Z951 Presence of aortocoronary bypass graft: Secondary | ICD-10-CM | POA: Diagnosis not present

## 2020-04-24 DIAGNOSIS — K8050 Calculus of bile duct without cholangitis or cholecystitis without obstruction: Secondary | ICD-10-CM

## 2020-04-24 DIAGNOSIS — K7469 Other cirrhosis of liver: Secondary | ICD-10-CM

## 2020-04-24 DIAGNOSIS — D649 Anemia, unspecified: Secondary | ICD-10-CM

## 2020-04-24 DIAGNOSIS — K859 Acute pancreatitis without necrosis or infection, unspecified: Secondary | ICD-10-CM | POA: Diagnosis not present

## 2020-04-24 DIAGNOSIS — C220 Liver cell carcinoma: Secondary | ICD-10-CM | POA: Diagnosis not present

## 2020-04-24 DIAGNOSIS — K851 Biliary acute pancreatitis without necrosis or infection: Principal | ICD-10-CM

## 2020-04-24 DIAGNOSIS — E119 Type 2 diabetes mellitus without complications: Secondary | ICD-10-CM | POA: Diagnosis not present

## 2020-04-24 DIAGNOSIS — Z7982 Long term (current) use of aspirin: Secondary | ICD-10-CM

## 2020-04-24 DIAGNOSIS — Z8505 Personal history of malignant neoplasm of liver: Secondary | ICD-10-CM | POA: Diagnosis not present

## 2020-04-24 DIAGNOSIS — R1013 Epigastric pain: Secondary | ICD-10-CM | POA: Diagnosis not present

## 2020-04-24 DIAGNOSIS — E1165 Type 2 diabetes mellitus with hyperglycemia: Secondary | ICD-10-CM

## 2020-04-24 DIAGNOSIS — Z79899 Other long term (current) drug therapy: Secondary | ICD-10-CM

## 2020-04-24 DIAGNOSIS — I10 Essential (primary) hypertension: Secondary | ICD-10-CM | POA: Diagnosis present

## 2020-04-24 DIAGNOSIS — D693 Immune thrombocytopenic purpura: Secondary | ICD-10-CM | POA: Diagnosis present

## 2020-04-24 DIAGNOSIS — E1169 Type 2 diabetes mellitus with other specified complication: Secondary | ICD-10-CM

## 2020-04-24 DIAGNOSIS — D61818 Other pancytopenia: Secondary | ICD-10-CM

## 2020-04-24 DIAGNOSIS — K861 Other chronic pancreatitis: Secondary | ICD-10-CM | POA: Diagnosis not present

## 2020-04-24 DIAGNOSIS — K7460 Unspecified cirrhosis of liver: Secondary | ICD-10-CM | POA: Diagnosis not present

## 2020-04-24 DIAGNOSIS — Z8679 Personal history of other diseases of the circulatory system: Secondary | ICD-10-CM | POA: Diagnosis not present

## 2020-04-24 DIAGNOSIS — K8020 Calculus of gallbladder without cholecystitis without obstruction: Secondary | ICD-10-CM | POA: Diagnosis not present

## 2020-04-24 DIAGNOSIS — Z91040 Latex allergy status: Secondary | ICD-10-CM

## 2020-04-24 DIAGNOSIS — R7401 Elevation of levels of liver transaminase levels: Secondary | ICD-10-CM | POA: Diagnosis not present

## 2020-04-24 DIAGNOSIS — E7800 Pure hypercholesterolemia, unspecified: Secondary | ICD-10-CM | POA: Diagnosis present

## 2020-04-24 DIAGNOSIS — Z888 Allergy status to other drugs, medicaments and biological substances status: Secondary | ICD-10-CM

## 2020-04-24 DIAGNOSIS — K838 Other specified diseases of biliary tract: Secondary | ICD-10-CM | POA: Diagnosis not present

## 2020-04-24 DIAGNOSIS — E785 Hyperlipidemia, unspecified: Secondary | ICD-10-CM | POA: Diagnosis present

## 2020-04-24 DIAGNOSIS — Z7984 Long term (current) use of oral hypoglycemic drugs: Secondary | ICD-10-CM

## 2020-04-24 LAB — COMPREHENSIVE METABOLIC PANEL
ALT: 63 U/L — ABNORMAL HIGH (ref 0–44)
AST: 92 U/L — ABNORMAL HIGH (ref 15–41)
Albumin: 3.8 g/dL (ref 3.5–5.0)
Alkaline Phosphatase: 123 U/L (ref 38–126)
Anion gap: 8 (ref 5–15)
BUN: 12 mg/dL (ref 8–23)
CO2: 26 mmol/L (ref 22–32)
Calcium: 8.7 mg/dL — ABNORMAL LOW (ref 8.9–10.3)
Chloride: 105 mmol/L (ref 98–111)
Creatinine, Ser: 1.23 mg/dL (ref 0.61–1.24)
GFR, Estimated: >60 mL/min (ref >60)
Glucose, Bld: 140 mg/dL — ABNORMAL HIGH (ref 70–99)
Potassium: 3.5 mmol/L (ref 3.5–5.1)
Sodium: 139 mmol/L (ref 135–145)
Total Bilirubin: 4 mg/dL — ABNORMAL HIGH (ref 0.3–1.2)
Total Protein: 6.8 g/dL (ref 6.5–8.1)

## 2020-04-24 LAB — PROTIME-INR
INR: 1.1 (ref 0.8–1.2)
Prothrombin Time: 14.1 seconds (ref 11.4–15.2)

## 2020-04-24 LAB — CBC WITH DIFFERENTIAL/PLATELET
Abs Immature Granulocytes: 0.04 10*3/uL (ref 0.00–0.07)
Basophils Absolute: 0 10*3/uL (ref 0.0–0.1)
Basophils Relative: 0 %
Eosinophils Absolute: 0 10*3/uL (ref 0.0–0.5)
Eosinophils Relative: 1 %
HCT: 23.6 % — ABNORMAL LOW (ref 39.0–52.0)
Hemoglobin: 8.2 g/dL — ABNORMAL LOW (ref 13.0–17.0)
Immature Granulocytes: 2 %
Lymphocytes Relative: 23 %
Lymphs Abs: 0.6 10*3/uL — ABNORMAL LOW (ref 0.7–4.0)
MCH: 34.6 pg — ABNORMAL HIGH (ref 26.0–34.0)
MCHC: 34.7 g/dL (ref 30.0–36.0)
MCV: 99.6 fL (ref 80.0–100.0)
Monocytes Absolute: 0.2 10*3/uL (ref 0.1–1.0)
Monocytes Relative: 7 %
Neutro Abs: 1.7 10*3/uL (ref 1.7–7.7)
Neutrophils Relative %: 67 %
Platelets: 32 10*3/uL — ABNORMAL LOW (ref 150–400)
RBC: 2.37 MIL/uL — ABNORMAL LOW (ref 4.22–5.81)
RDW: 16.3 % — ABNORMAL HIGH (ref 11.5–15.5)
WBC: 2.6 10*3/uL — ABNORMAL LOW (ref 4.0–10.5)
nRBC: 0 % (ref 0.0–0.2)

## 2020-04-24 LAB — SURGICAL PATHOLOGY

## 2020-04-24 LAB — TROPONIN I (HIGH SENSITIVITY): Troponin I (High Sensitivity): 5 ng/L

## 2020-04-24 LAB — GLUCOSE, CAPILLARY
Glucose-Capillary: 68 mg/dL — ABNORMAL LOW (ref 70–99)
Glucose-Capillary: 73 mg/dL (ref 70–99)
Glucose-Capillary: 95 mg/dL (ref 70–99)
Glucose-Capillary: 70 mg/dL (ref 70–99)

## 2020-04-24 LAB — LIPASE, BLOOD: Lipase: 1132 U/L — ABNORMAL HIGH (ref 11–51)

## 2020-04-24 LAB — CBG MONITORING, ED: Glucose-Capillary: 139 mg/dL — ABNORMAL HIGH (ref 70–99)

## 2020-04-24 IMAGING — MR MR ABDOMEN WO/W CM MRCP
19 of 20 series · 46 of 48 positions shown · IV contrast (gadavist)
Comparison: [DATE].

CLINICAL DATA: Pancreatitis.  History of hepatocellular carcinoma.

EXAM:
MRI ABDOMEN WITHOUT AND WITH CONTRAST (INCLUDING MRCP)
TECHNIQUE: Multiplanar multisequence MR imaging of the abdomen was performed
both before and after the administration of intravenous contrast.
Heavily T2-weighted images of the biliary and pancreatic ducts were
obtained, and three-dimensional MRCP images were rendered by post
processing.
CONTRAST:  9mL GADAVIST GADOBUTROL 1 MMOL/ML IV SOLN

[Series 3: T2 · coronal · 6.0mm · 1.19mm/px · 2 of 30 slices shown (1 of 2)]
[im 1/30]
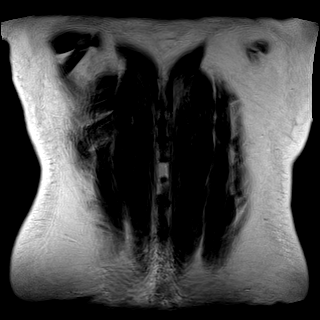
[im 30/30]
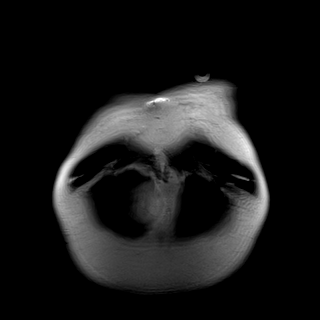

[Series 4: T2 · axial · 6.0mm · 1.19mm/px · z∈[-191,+68]mm · 2 of 37 slices shown (2 of 2)]
[im 1/37]
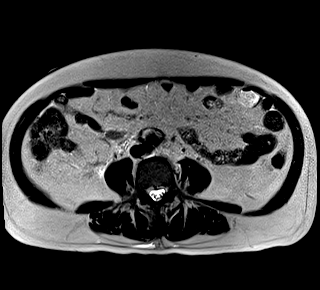
[im 37/37]
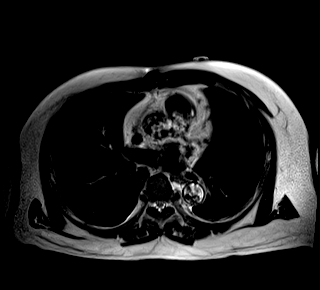

[Series 5: T1 · axial · 6.0mm · 0.74mm/px · z∈[-191,+68]mm · 2 of 37 slices shown (1 of 2)]
[im 1/37]
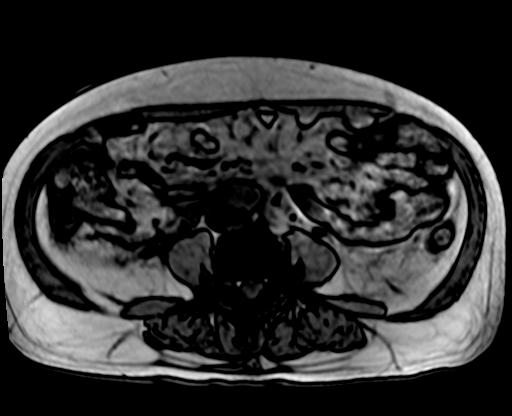
[im 37/37]
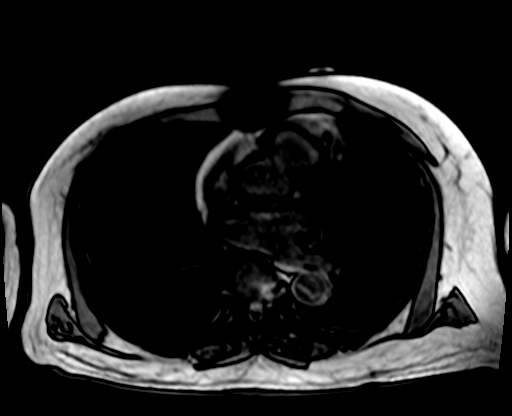

[Series 8: T2 fat-sat · axial · 6.0mm · 1.19mm/px · z∈[-178,+67]mm · 2 of 35 slices shown]
[im 1/35]
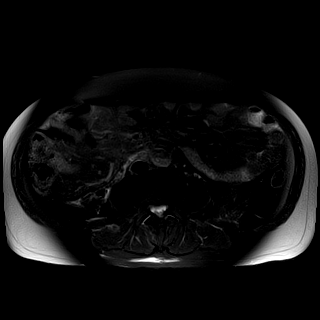
[im 35/35]
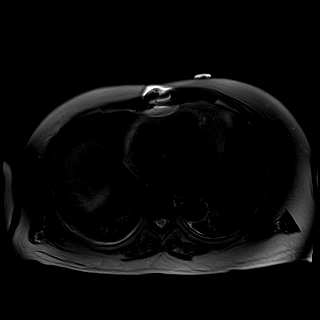

[Series 10: ax dwi_tracew · axial · 6.0mm · 1.42mm/px · z∈[-182,+70]mm · 4 of 108 slices shown]
[im 1/108]
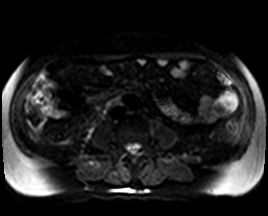
[im 36/108]
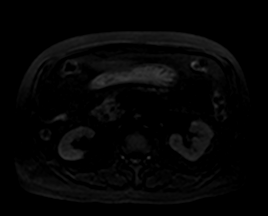
[im 72/108]
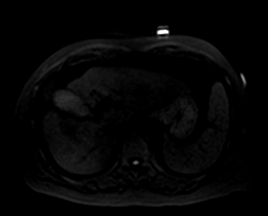
[im 108/108]
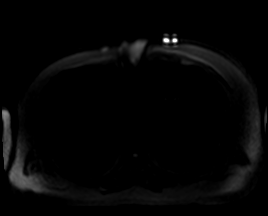

[Series 11: ax dwi_adc · axial · 6.0mm · 1.42mm/px · 1 of 36 slices shown]
[im 1/36]
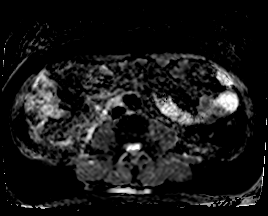

[Series 14: MRCP · coronal · 3.0mm · 1.12mm/px · 1 of 19 slices shown]
[im 1/19]
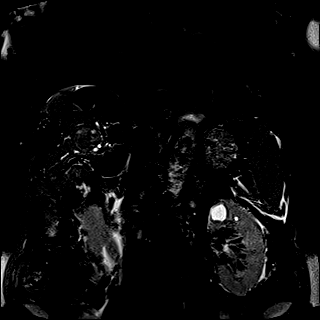

[Series 15: radials · coronal · 50.0mm · 0.78mm/px · 1 of 5 slices shown]
[im 1/5]
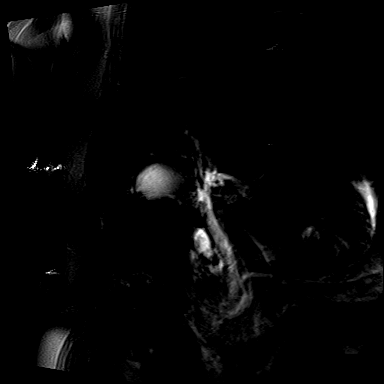

[Series 16: T1 dynamic fat-sat · axial · non-contrast · 3.0mm · 1.19mm/px · z∈[-192,+69]mm · 3 of 88 slices shown (1 of 5)]
[im 1/88]
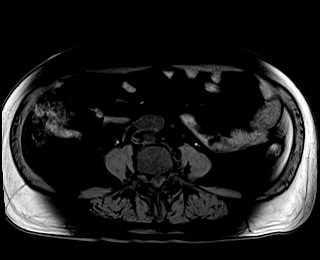
[im 44/88]
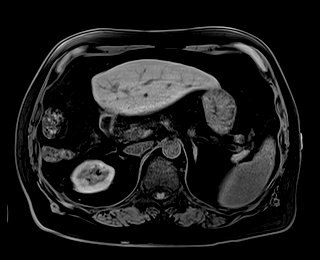
[im 88/88]
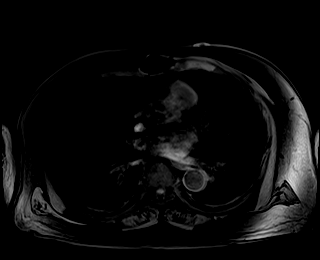

[Series 17: T1 dynamic fat-sat post-contrast · axial · 3.0mm · 1.19mm/px · z∈[-192,+69]mm · 3 of 88 slices shown (1 of 4)]
[im 1/88]
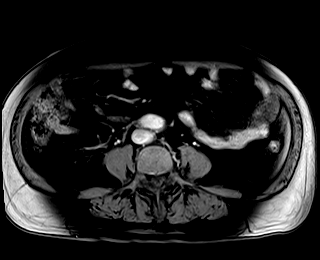
[im 44/88]
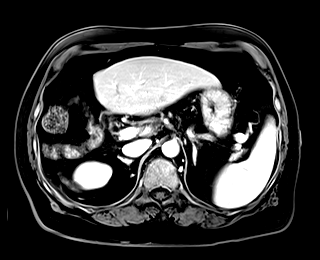
[im 88/88]
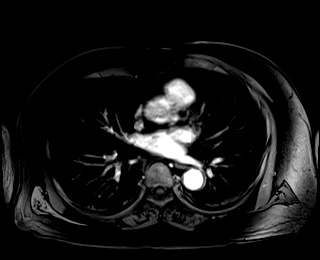

[Series 18: T1 dynamic fat-sat · axial · 3.0mm · 1.19mm/px · z∈[-192,+69]mm · 3 of 88 slices shown (2 of 5)]
[im 1/88]
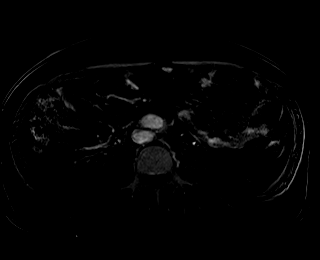
[im 44/88]
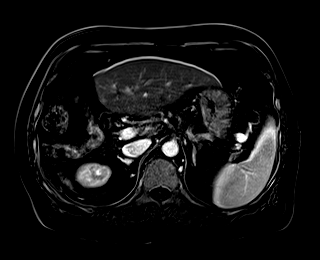
[im 88/88]
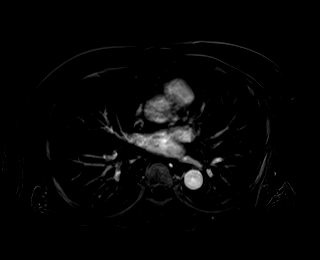

[Series 19: T1 dynamic fat-sat post-contrast · axial · 3.0mm · 1.19mm/px · z∈[-192,+69]mm · 3 of 88 slices shown (2 of 4)]
[im 1/88]
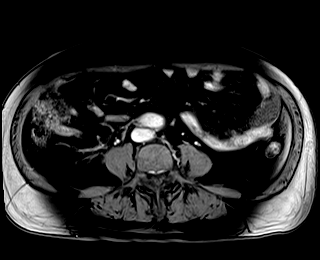
[im 44/88]
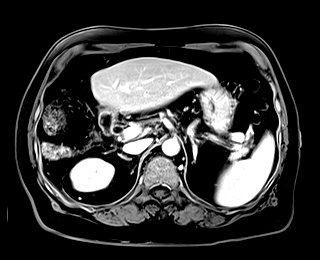
[im 88/88]
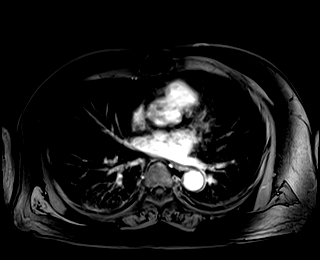

[Series 20: T1 dynamic fat-sat · axial · 3.0mm · 1.19mm/px · z∈[-192,+69]mm · 3 of 88 slices shown (3 of 5)]
[im 1/88]
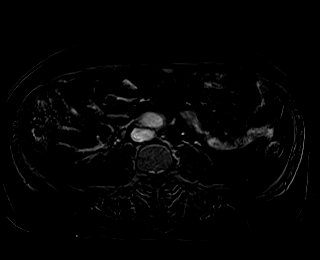
[im 44/88]
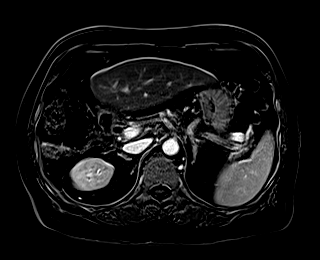
[im 88/88]
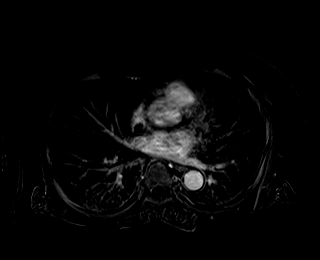

[Series 21: T1 dynamic fat-sat post-contrast · axial · 3.0mm · 1.19mm/px · z∈[-192,+69]mm · 3 of 88 slices shown (3 of 4)]
[im 1/88]
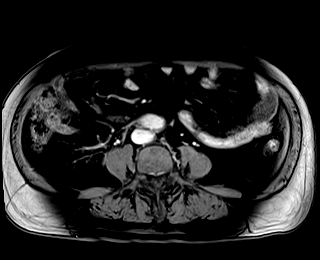
[im 44/88]
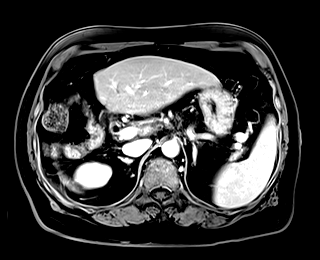
[im 88/88]
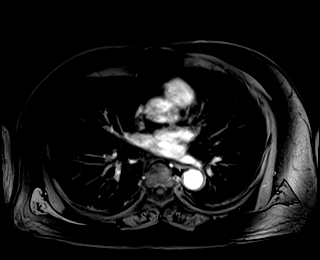

[Series 22: T1 dynamic fat-sat · axial · 3.0mm · 1.19mm/px · z∈[-192,+69]mm · 3 of 88 slices shown (4 of 5)]
[im 1/88]
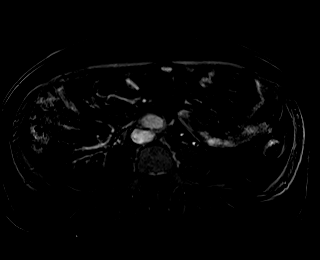
[im 44/88]
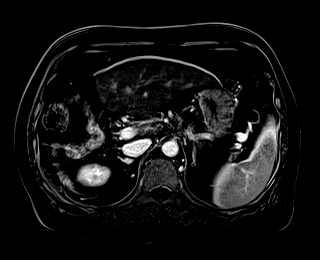
[im 88/88]
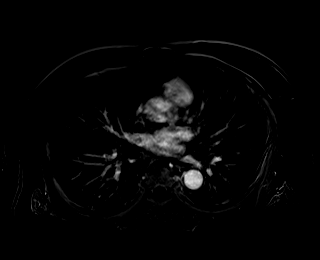

[Series 23: T1 dynamic post-contrast · coronal · 3.0mm · 1.31mm/px · 3 of 72 slices shown]
[im 1/72]
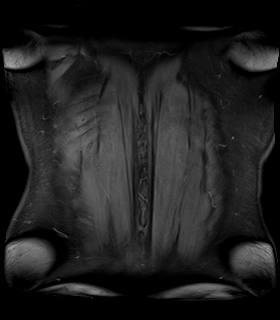
[im 36/72]
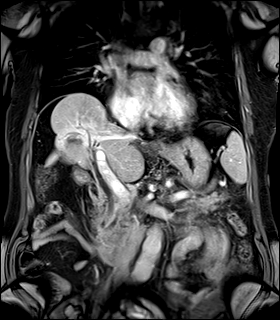
[im 72/72]
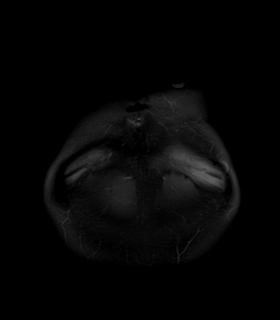

[Series 24: T1 dynamic fat-sat post-contrast · axial · 3.0mm · 1.19mm/px · z∈[-192,+69]mm · 3 of 88 slices shown (4 of 4)]
[im 1/88]
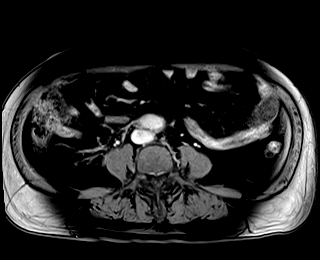
[im 44/88]
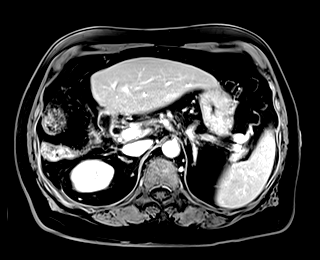
[im 88/88]
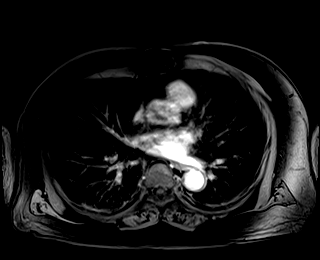

[Series 25: T1 dynamic fat-sat · axial · 3.0mm · 1.19mm/px · z∈[-192,+69]mm · 3 of 88 slices shown (5 of 5)]
[im 1/88]
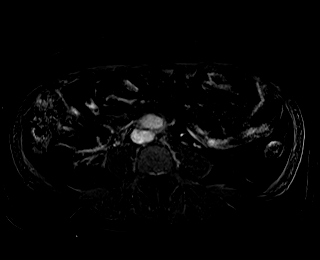
[im 44/88]
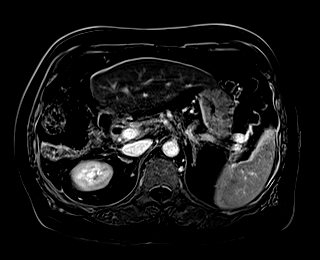
[im 88/88]
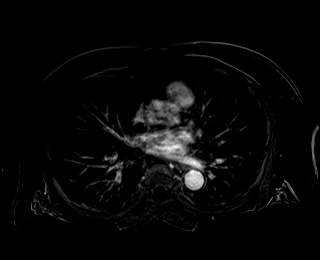

[Series 1066: T1 · axial · 6.0mm · 0.74mm/px · 1 of 37 slices shown (2 of 2)]
[im 1/37]
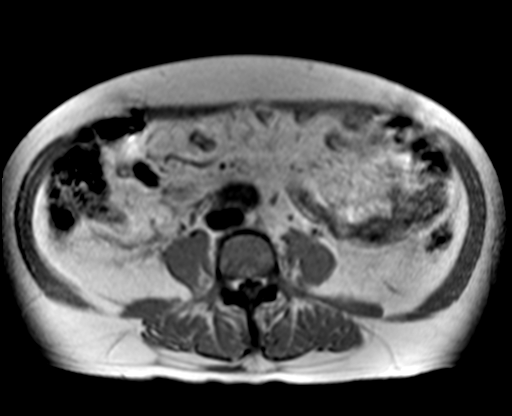

[46 of 48 positions shown; findings below may reference images not displayed]

FINDINGS: Lower chest: No acute findings.

Hepatobiliary: Morphologic features of cirrhosis are again noted
involving the liver. The centrally necrotic mass in segment 5 of the
liver with 7 mm of enhancing marginal thickness is again noted,
image 29/17. On today's study this measures 4.5 by 3.5 cm, image
29/17. Previously 4.3 x 3.6 cm.

High T1 signal within the gallbladder is again noted. On the MRCP
images in the CBD measures 0.9 cm, image [DATE]. This is compared with
5 mm on [DATE]. High T1 signal and low T2 signal sludge/debris
(and possibly small stones) fill the distal common bile duct, image
[DATE].

Pancreas: There is diffuse pancreatic edema and peripancreatic fluid
compatible with acute pancreatitis. This is increased when compared
with the previous exam. Interval increase in caliber of the main
pancreatic duct which measures 4 mm, image [DATE]. Previously 2 mm. No
discrete mature fluid collections identified to suggest pseudocyst.
No signs of pancreatic necrosis.

Spleen: Spleen has a normal size measuring 10 cm in length. No focal
splenic abnormality.

Adrenals/Urinary Tract: Normal appearance of the adrenal glands. No
hydronephrosis identified bilaterally. Upper pole left kidney cyst
measures 1.8 cm.

Stomach/Bowel: Visualized portions within the abdomen are
unremarkable.

Vascular/Lymphatic: Aortic atherosclerosis. No aneurysm portal vein
and splenic vein remain patent. No abdominal adenopathy.

Other: Trace free fluid along the pericolic gutters. No discrete,
drainable fluid collection identified.

Musculoskeletal: No suspicious bone lesions identified.
IMPRESSION: 1. No significant change in size of centrally necrotic mass in
segment 5 of the liver compatible with known hepatocellular
carcinoma. Currently measures 4.5 by 3.5 cm. Previously measured
x 3.6 cm.
2. Interval progression acute pancreatitis with increased edema and
surrounding fluid. No pseudo cyst or signs of pancreatic necrosis.
3. Increase in caliber of the CBD and main pancreatic duct. The
distal common bile duct is filled with high T1, low T2 debris/sludge
and possibly small stones, new from previous exam.
4. Morphologic features of cirrhosis.
5. Trace free fluid along the pericolic gutters.

## 2020-04-24 MED ORDER — HYDROMORPHONE HCL 1 MG/ML IJ SOLN
0.5000 mg | INTRAMUSCULAR | Status: DC | PRN
  Administered 2020-04-24 (×2): 1 mg via INTRAVENOUS
  Filled 2020-04-24 (×2): qty 1

## 2020-04-24 MED ORDER — INSULIN ASPART 100 UNIT/ML ~~LOC~~ SOLN
0.0000 [IU] | SUBCUTANEOUS | Status: DC
  Administered 2020-04-26: 13:00:00 1 [IU] via SUBCUTANEOUS
  Administered 2020-04-26: 09:00:00 3 [IU] via SUBCUTANEOUS
  Filled 2020-04-24 (×3): qty 1

## 2020-04-24 MED ORDER — ACETAMINOPHEN 650 MG RE SUPP: 650.0000 mg | Freq: Four times a day (QID) | RECTAL | Status: DC | PRN

## 2020-04-24 MED ORDER — PIPERACILLIN-TAZOBACTAM 3.375 G IVPB 30 MIN: 3.3750 g | Freq: Three times a day (TID) | INTRAVENOUS | Status: DC

## 2020-04-24 MED ORDER — ONDANSETRON HCL 4 MG/2ML IJ SOLN: 4.0000 mg | Freq: Four times a day (QID) | INTRAMUSCULAR | Status: DC | PRN

## 2020-04-24 MED ORDER — SODIUM CHLORIDE 0.9 % IV SOLN: INTRAVENOUS | Status: DC

## 2020-04-24 MED ORDER — DOCUSATE SODIUM 100 MG PO CAPS
200.0000 mg | ORAL_CAPSULE | Freq: Two times a day (BID) | ORAL | Status: DC
  Administered 2020-04-24 – 2020-04-26 (×4): 200 mg via ORAL
  Filled 2020-04-24 (×4): qty 2

## 2020-04-24 MED ORDER — FENTANYL CITRATE (PF) 100 MCG/2ML IJ SOLN
50.0000 ug | Freq: Once | INTRAMUSCULAR | Status: AC
  Administered 2020-04-24: 50 ug via INTRAVENOUS
  Filled 2020-04-24: qty 2

## 2020-04-24 MED ORDER — ONDANSETRON HCL 4 MG PO TABS: 4.0000 mg | ORAL_TABLET | Freq: Four times a day (QID) | ORAL | Status: DC | PRN

## 2020-04-24 MED ORDER — PIPERACILLIN-TAZOBACTAM 3.375 G IVPB 30 MIN
3.3750 g | Freq: Once | INTRAVENOUS | Status: AC
  Administered 2020-04-24: 3.375 g via INTRAVENOUS
  Filled 2020-04-24: qty 50

## 2020-04-24 MED ORDER — MORPHINE SULFATE (PF) 4 MG/ML IV SOLN
4.0000 mg | Freq: Once | INTRAVENOUS | Status: AC
  Administered 2020-04-24: 4 mg via INTRAVENOUS
  Filled 2020-04-24: qty 1

## 2020-04-24 MED ORDER — PIPERACILLIN-TAZOBACTAM 3.375 G IVPB
3.3750 g | Freq: Three times a day (TID) | INTRAVENOUS | Status: DC
  Administered 2020-04-24 – 2020-04-26 (×6): 3.375 g via INTRAVENOUS
  Filled 2020-04-24 (×6): qty 50

## 2020-04-24 MED ORDER — OXYCODONE HCL 5 MG PO TABS
10.0000 mg | ORAL_TABLET | Freq: Four times a day (QID) | ORAL | Status: DC | PRN
  Administered 2020-04-24: 10 mg via ORAL
  Filled 2020-04-24: qty 2

## 2020-04-24 MED ORDER — DEXTROSE-NACL 5-0.9 % IV SOLN: INTRAVENOUS | Status: DC

## 2020-04-24 MED ORDER — GADOBUTROL 1 MMOL/ML IV SOLN
9.0000 mL | Freq: Once | INTRAVENOUS | Status: AC | PRN
  Administered 2020-04-24: 9 mL via INTRAVENOUS

## 2020-04-24 MED ORDER — ACETAMINOPHEN 325 MG PO TABS: 650.0000 mg | ORAL_TABLET | Freq: Four times a day (QID) | ORAL | Status: DC | PRN

## 2020-04-24 MED ORDER — ONDANSETRON HCL 4 MG/2ML IJ SOLN
4.0000 mg | Freq: Once | INTRAMUSCULAR | Status: AC
  Administered 2020-04-24: 4 mg via INTRAVENOUS
  Filled 2020-04-24: qty 2

## 2020-04-24 MED ORDER — TAMSULOSIN HCL 0.4 MG PO CAPS
0.4000 mg | ORAL_CAPSULE | Freq: Every day | ORAL | Status: DC
  Filled 2020-04-24: qty 1

## 2020-04-24 NOTE — H&P (Signed)
History and Physical    Timothy Murray BWI:[id number] DOB: 22-Jun-1945 DOA: 04/24/2020  PCP: Danelle Berry, NP   Patient coming from: Home  I have personally briefly reviewed patient's old medical records in [place]  Chief Complaint: Abdominal pain  HPI: Timothy Murray is a 75 y.o. male with medical history significant for hepatocellular carcinoma 03/2019 s/p proton beam radiation x25 sessions, completed 06/2019, HTN, diabetes, pancytopenia s/p bone marrow biopsy on 4/19, CAD status post CABG x3, cholelithiasis, not a candidate for cholecystectomy as the liver mass is abutting the gallbladder, and hospitalized a week ago for acute biliary pancreatitis with lipase in the 6000s, and bilirubin of 7, discharged within 24 hours due to rapid resolution confirmed by MRI, who presents with recurrence of abdominal pain, similar to recent episode.  The pain started after a fish dinner-, severe and located in the left epigastric region radiating to the back.  Associated with nausea but no vomiting.  He has no fever or chills, no chest pain or shortness of breath. ED Course: On arrival, afebrile, BP 143/57,, respirations 16 with O2 sat 100% on room air.  Blood work significant for AST/ALT of 92/63, alk phos 123, bilirubin 4, down from 6.8 at discharge a week prior.  Lipase 1132, up from 690 at discharge on 4/13 but down from a high of 6185 on admission on 4/13.  He has continued pancytopenia, unchanged with WBC 2600, hemoglobin 8.2 and platelets 32,000.  Troponin normal at 5. EKG as reviewed by me : NSR at 74 with no acute ST-T wave changes Imaging: RUQ sonogram: Negative sonographic Murphy sign.  Cholelithiasis and mild gallbladder wall thickening. No other evidence of acute cholecystitis. 2. Hepatic cirrhosis and right hepatic lobe mass, better characterized on recent abdominal MRI.  Patient needed IV morphine and fentanyl for pain control in the ER.  Hospitalist consulted for  admission.  Review of Systems: As per HPI otherwise all other systems on review of systems negative.    Past Medical History:  Diagnosis Date  . Arthritis    "lower back" (10/24/2012)  . Coronary artery disease   . GERD (gastroesophageal reflux disease)   . High cholesterol   . Hypertension   . Liver cancer ([place])   . Type II diabetes mellitus ([place])     Past Surgical History:  Procedure Laterality Date  . APPENDECTOMY  2002  . CARDIAC CATHETERIZATION  10/24/2012  . CORONARY ARTERY BYPASS GRAFT N/A 10/26/2012   Procedure: CORONARY ARTERY BYPASS GRAFTING (CABG);  Surgeon: Melrose Nakayama, MD;  Location: [facility];  Service: Open Heart Surgery;  Laterality: N/A;  Times 3 using left internal mammary artery and endoscopically harvested right saphenous vein     reports that he quit smoking about 18 years ago. His smoking use included cigarettes. He has a 15.00 pack-year smoking history. He has never used smokeless tobacco. He reports that he does not drink alcohol and does not use drugs.  Allergies  Allergen Reactions  . Other Nausea And Vomiting  . Soap & Cleansers     "liquid soap" antibacterial -rash  . Latex Rash and Itching    Family History  Problem Relation Age of Onset  . Cancer Sister      Prior to Admission medications   Medication Sig Start Date End Date Taking? Authorizing Provider  aspirin EC 81 MG tablet Take 81 mg by mouth daily.    [provider]  atorvastatin (LIPITOR) 40 MG tablet Take 1 tablet by  mouth daily. 11/21/19   [provider]  fluocinonide cream (LIDEX) 0.05 %  11/17/12   [provider]  glimepiride (AMARYL) 1 MG tablet Take 1 mg by mouth daily.    [provider]  glucosamine-chondroitin 500-400 MG tablet Take 1 tablet by mouth daily.    [provider]  losartan (COZAAR) 25 MG tablet Take 25 mg by mouth daily.    [provider]  metoprolol tartrate (LOPRESSOR) 25 MG tablet Take 25 mg by  mouth daily.    [provider]  omeprazole (PRILOSEC) 20 MG capsule Take 40 mg by mouth every morning.    [provider]  zolpidem (AMBIEN) 10 MG tablet Take 10 mg by mouth at bedtime as needed for sleep. Patient not taking: No sig reported    [provider]    Physical Exam: Vitals:   04/24/20 0056 04/24/20 0204 04/24/20 0312  BP: (!) 143/57 (!) 129/59 137/61  Pulse: 78 72 76  Resp: 16 20 20   Temp: 97.9 F (36.6 C)    TempSrc: Oral    SpO2: 100% 98% 99%  Weight: 93 kg    Height: 6' (1.829 m)       Vitals:   04/24/20 0056 04/24/20 0204 04/24/20 0312  BP: (!) 143/57 (!) 129/59 137/61  Pulse: 78 72 76  Resp: 16 20 20   Temp: 97.9 F (36.6 C)    TempSrc: Oral    SpO2: 100% 98% 99%  Weight: 93 kg    Height: 6' (1.829 m)      Constitutional: Alert and oriented x 3 . Not in any apparent distress HEENT:      Head: Normocephalic and atraumatic.         Eyes: PERLA, EOMI, Conjunctivae are normal. Sclera is non-icteric.       Mouth/Throat: Mucous membranes are moist.       Neck: Supple with no signs of meningismus. Cardiovascular: Regular rate and rhythm. No murmurs, gallops, or rubs. 2+ symmetrical distal pulses are present . No JVD. No LE edema Respiratory: Respiratory effort normal .Lungs sounds clear bilaterally. No wheezes, crackles, or rhonchi.  Gastrointestinal: Soft, tender in epigastrium and RUQ, and non distended with positive bowel sounds.  Genitourinary: No CVA tenderness. Musculoskeletal: Nontender with normal range of motion in all extremities. No cyanosis, or erythema of extremities. Neurologic:  Face is symmetric. Moving all extremities. No gross focal neurologic deficits . Skin: Skin is warm, dry.  No rash or ulcers Psychiatric: Mood and affect are normal    Labs on Admission: I have personally reviewed following labs and imaging studies  CBC: Recent Labs  Lab 04/22/20 0900 04/24/20 0147  WBC 2.1* 2.6*  NEUTROABS 1.3* 1.7   HGB 8.8* 8.2*  HCT 25.0* 23.6*  MCV 98.0 99.6  PLT 33* 32*   Basic Metabolic Panel: Recent Labs  Lab 04/17/20 0804 04/24/20 0147  NA 139 139  K 3.6 3.5  CL 105 105  CO2 27 26  GLUCOSE 111* 140*  BUN 9 12  CREATININE 0.84 1.23  CALCIUM 8.5* 8.7*  MG 2.2  --    GFR: Estimated Creatinine Clearance: 57.8 mL/min (by C-G formula based on SCr of 1.23 mg/dL). Liver Function Tests: Recent Labs  Lab 04/17/20 0804 04/24/20 0147  AST 57* 92*  ALT 74* 63*  ALKPHOS 71 123  BILITOT 6.8* 4.0*  PROT 6.0* 6.8  ALBUMIN 3.5 3.8   Recent Labs  Lab 04/17/20 0804 04/24/20 0147  LIPASE 690* 1,132*  Recent Labs  Lab 04/17/20 0804  AMMONIA 12   Coagulation Profile: Recent Labs  Lab 04/17/20 0804  INR 1.2   Cardiac Enzymes: No results for input(s): CKTOTAL, CKMB, CKMBINDEX, TROPONINI in the last 168 hours. BNP (last 3 results) No results for input(s): PROBNP in the last 8760 hours. HbA1C: No results for input(s): HGBA1C in the last 72 hours. CBG: Recent Labs  Lab 04/17/20 0726 04/17/20 1230 04/17/20 1358 04/22/20 0913 04/22/20 1031  GLUCAP 97 84 96 131* 121*   Lipid Profile: No results for input(s): CHOL, HDL, LDLCALC, TRIG, CHOLHDL, LDLDIRECT in the last 72 hours. Thyroid Function Tests: No results for input(s): TSH, T4TOTAL, FREET4, T3FREE, THYROIDAB in the last 72 hours. Anemia Panel: No results for input(s): VITAMINB12, FOLATE, FERRITIN, TIBC, IRON, RETICCTPCT in the last 72 hours. Urine analysis:    Component Value Date/Time   COLORURINE YELLOW (A) 04/16/2020 1415   APPEARANCEUR CLEAR (A) 04/16/2020 1415   APPEARANCEUR Clear 07/02/2011 0128   LABSPEC 1.003 (L) 04/16/2020 1415   LABSPEC 1.010 07/02/2011 0128   PHURINE 6.0 04/16/2020 1415   GLUCOSEU NEGATIVE 04/16/2020 1415   GLUCOSEU Negative 07/02/2011 0128   HGBUR SMALL (A) 04/16/2020 1415   BILIRUBINUR NEGATIVE 04/16/2020 1415   BILIRUBINUR Negative 07/02/2011 0128   KETONESUR NEGATIVE 04/16/2020  1415   PROTEINUR NEGATIVE 04/16/2020 1415   UROBILINOGEN 0.2 10/25/2012 0517   NITRITE NEGATIVE 04/16/2020 1415   LEUKOCYTESUR NEGATIVE 04/16/2020 1415   LEUKOCYTESUR Negative 07/02/2011 0128    Radiological Exams on Admission: CT BONE MARROW BIOPSY & ASPIRATION  Result Date: 04/22/2020 CLINICAL DATA:  Thrombocytopenia. Possible ITP. History of hepatocellular carcinoma. EXAM: CT GUIDED BONE MARROW ASPIRATION AND BIOPSY ANESTHESIA/SEDATION: Versed 1.0 mg IV, Fentanyl 50 mcg IV Total Moderate Sedation Time:   15 minutes. The patient's level of consciousness and physiologic status were continuously monitored during the procedure by Radiology nursing. PROCEDURE: The procedure risks, benefits, and alternatives were explained to the patient. Questions regarding the procedure were encouraged and answered. The patient understands and consents to the procedure. A time out was performed prior to initiating the procedure. The right gluteal region was prepped with chlorhexidine. Sterile gown and sterile gloves were used for the procedure. Local anesthesia was provided with 1% Lidocaine. Under CT guidance, an 11 gauge On Control bone cutting needle was advanced from a posterior approach into the right iliac bone. Needle positioning was confirmed with CT. Initial non heparinized and heparinized aspirate samples were obtained of bone marrow. Core biopsy was performed via the On Control drill needle. COMPLICATIONS: None FINDINGS: Inspection of initial aspirate did reveal visible particles. Intact core biopsy sample was obtained. IMPRESSION: CT guided bone marrow biopsy of right posterior iliac bone with both aspirate and core samples obtained. Electronically Signed   By: Aletta Edouard M.D.   On: 04/22/2020 11:26   US ABDOMEN LIMITED RUQ (LIVER/GB)  Result Date: 04/24/2020 CLINICAL DATA:  Epigastric pain EXAM: ULTRASOUND ABDOMEN LIMITED RIGHT UPPER QUADRANT COMPARISON:  MRI abdomen 04/17/2020 FINDINGS: Gallbladder:  There are sludge and stones in the gallbladder. A negative sonographic Percell Miller sign was reported by the sonographer. No pericholecystic fluid. Mild wall thickening. Common bile duct: Diameter: 6 mm Liver: Redemonstration of mass within the right hepatic lobe, predominantly hyperechoic. This is better characterized on recent MRI. Nodular liver contour. Portal vein is patent on color Doppler imaging with normal direction of blood flow towards the liver. Other: None. IMPRESSION: 1. Cholelithiasis and mild gallbladder wall thickening. No other evidence of acute cholecystitis.  2. Hepatic cirrhosis and right hepatic lobe mass, better characterized on recent abdominal MRI. Electronically Signed   By: Ulyses Jarred M.D.   On: 04/24/2020 02:54     Assessment/Plan 75 year old male with history of hepatocellular carcinoma diagnosed 03/2019 s/p proton beam radiation x25 sessions, completed 06/2019, HTN, diabetes, pancytopenia, CAD status post CABG x3, cholelithiasis, poor candidate for cholecystectomy due to proximity of gallbladder wall to known HCC, and hospitalized a week prior for acute biliary pancreatitis with lipase in the 6000s, and bilirubin of 7, discharged within 24 hours due to rapid resolution confirmed by MRI, who presents with recurrence of abdominal pain, similar to recent episode.      Acute pancreatitis, recurrent   Cholelithiasis, possible choledocholithiasis - Patient recently admitted 4/13 with acute pancreatitis secondary to choledocholithiasis.  Lipase 6200 and bilirubin of 7 - Lipase of 1132, uptrending from 690 at discharge on 4/14 and bilirubin of 4,  Down from 6.8 -RUQ sonogram: Negative sonographic Murphy sign.  Cholelithiasis and mild gallbladder wall thickening. No other evidence of acute cholecystitis. 2. Hepatic cirrhosis and right hepatic lobe mass, better characterized on recent abdominal MRI. -Patient saw hepatobiliary surgery at [facility], Dr Mariah Milling 01/08/19 (care everywhere) - IV hydration,  IV pain control - Monitor lipase and liver enzymes - GI consult - We will defer decision for repeat MRCP to GI, given recent rapid resolution with conservative management, as well as somewhat reassuring findings on RUQ sonogram --Addendum: Dr Haig Prophet would like to proceed with MRCP.patient agreeable    Hepatocellular carcinoma [facility]) - Diagnosed March 2021, s/p proton beam radiation x25 sessions, completed 06/2019 - Followed by Hem onc    HTN (hypertension) - Continue home antihypertensives once BP tolerates    Diabetes ([place]) - Sliding scale insulin coverage    CAD  native coronary artery S/P CABG x 3 2014   Hx penetrating aortic ulcer s/p TEVAR - Denies chest pain.  EKG nonacute and troponin normal at 5 - Continue aspirin and metoprolol as BP will tolerate - We will avoid statin due to elevated LFTs    Pancytopenia ([facility]) - Stable from recent hospitalization a week prior with WBC 2600, hemoglobin 8.2 and platelets 32,000 - s/p CT-guided bone marrow biopsy on 4/19, ordered by Dr. Mike Gip - SCDs for DVT prophylaxis    DVT prophylaxis: SCDs Code Status: full code  Family Communication:  none  Disposition Plan: Back to previous home environment Consults called: GI Status:At the time of admission, it appears that the appropriate admission status for this patient is INPATIENT. This is judged to be reasonable and necessary in order to provide the required intensity of service to ensure the patient's safety given the presenting symptoms, physical exam findings, and initial radiographic and laboratory data in the context of their  Comorbid conditions.   Patient requires inpatient status due to high intensity of service, high risk for further deterioration and high frequency of surveillance required.   I certify that at the point of admission it is my clinical judgment that the patient will require inpatient hospital care spanning beyond [place] MD [facility]     04/24/2020, 4:30 AM

## 2020-04-24 NOTE — Progress Notes (Signed)
PROGRESS NOTE    Timothy Murray  CBS:[id number] DOB: 10-01-1945 DOA: 04/24/2020 PCP: Danelle Berry, NP  Assessment & Plan:   Principal Problem:   Acute pancreatitis Active Problems:   HTN (hypertension)   Diabetes ([place])   CAD (coronary artery disease), native coronary artery   S/P CABG x 3   Hepatocellular carcinoma ([facility])   Choledocholithiasis   Pancytopenia ([facility])   Recurrent pancreatitis: w/ possible choledocholithiasis. Continue on IV zosyn. Lipase is elevated. RUQ US shows neg sonographic Murphy sign. Cholelithiasis and mild gallbladder wall thickening. No other evidence of acute cholecystitis. Hepatic cirrhosis and right hepatic lobe mass. Repeat MRCP shows interval progression acute pancreatitis w/ increased edema & surrounding fluid. No pseudo cysts or signs of pancreatic necrosis. Increase in caliber of the CBD and main pancreatic duct. The distal common bile duct is filled with high T1, low T2 debris/sludge and possibly small stones, new from previous exam. No significant change in size of centrally necrotic mass in segment 5 of the liver compatible with known hepatocellular carcinoma. GI consulted.   Hepatocellular carcinoma: dx March 2021. S/p proton beam radiation x 25 sessions & completed 06/2019.   HTN: continue to hold all anti-HTN secondary to low normal BP  DM2: likely well controlled. Continue on SSI w/ accuchecks  Hx of CAD: s/p CABG in 2014. Hx penetrating aortic ulcer s/p TEVAR.   Pancytopenia: s/p bone marrow biopsy on 04/22/20 as per Dr. Mike Gip. Results to be discussed by onco as an outpatient   Cirrhosis: not on any lactulose or rifaximin as per pt's med rec. Continue w/ supportive care   Transaminitis: likely secondary to cirrhosis and hepatocellular carcinoma. Continue to hold statin    DVT prophylaxis: SCDs  Code Status: full  Family Communication:  Disposition Plan: likely d/c back home .  Level of care: Med-Surg   Status is:  Inpatient  Remains inpatient appropriate because:Ongoing diagnostic testing needed not appropriate for outpatient work up, Unsafe d/c plan, IV treatments appropriate due to intensity of illness or inability to take PO and Inpatient level of care appropriate due to severity of illness   Dispo: The patient is from: Home              Anticipated d/c is to: Home              Patient currently is not medically stable to d/c.   Difficult to place patient : unclear       Consultants:  GI    Procedures:   Antimicrobials: zosyn    Subjective: Pt c/o abd pain   Objective: Vitals:   04/24/20 0056 04/24/20 0204 04/24/20 0312 04/24/20 0707  BP: (!) 143/57 (!) 129/59 137/61 (!) 128/58  Pulse: 78 72 76 68  Resp: 16 20 20 16   Temp: 97.9 F (36.6 C)     TempSrc: Oral     SpO2: 100% 98% 99% 100%  Weight: 93 kg     Height: 6' (1.829 m)      No intake or output data in the 24 hours ending 04/24/20 0755 Filed Weights   04/24/20 0056  Weight: 93 kg    Examination:  General exam: Appears calm and comfortable  Respiratory system: Clear to auscultation. Respiratory effort normal. Cardiovascular system: S1 & S2 +. No rubs, gallops or clicks.  Gastrointestinal system: Abdomen is nondistended, soft and tenderness to palpation of epigastric area. Normal bowel sounds heard. Central nervous system: Alert and oriented. Moves all extremities  Psychiatry: Judgement and insight  appear normal. Flat mood and affect      Data Reviewed: I have personally reviewed following labs and imaging studies  CBC: Recent Labs  Lab 04/22/20 0900 04/24/20 0147  WBC 2.1* 2.6*  NEUTROABS 1.3* 1.7  HGB 8.8* 8.2*  HCT 25.0* 23.6*  MCV 98.0 99.6  PLT 33* 32*   Basic Metabolic Panel: Recent Labs  Lab 04/17/20 0804 04/24/20 0147  NA 139 139  K 3.6 3.5  CL 105 105  CO2 27 26  GLUCOSE 111* 140*  BUN 9 12  CREATININE 0.84 1.23  CALCIUM 8.5* 8.7*  MG 2.2  --    GFR: Estimated Creatinine  Clearance: 57.8 mL/min (by C-G formula based on SCr of 1.23 mg/dL). Liver Function Tests: Recent Labs  Lab 04/17/20 0804 04/24/20 0147  AST 57* 92*  ALT 74* 63*  ALKPHOS 71 123  BILITOT 6.8* 4.0*  PROT 6.0* 6.8  ALBUMIN 3.5 3.8   Recent Labs  Lab 04/17/20 0804 04/24/20 0147  LIPASE 690* 1,132*   Recent Labs  Lab 04/17/20 0804  AMMONIA 12   Coagulation Profile: Recent Labs  Lab 04/17/20 0804  INR 1.2   Cardiac Enzymes: No results for input(s): CKTOTAL, CKMB, CKMBINDEX, TROPONINI in the last 168 hours. BNP (last 3 results) No results for input(s): PROBNP in the last 8760 hours. HbA1C: No results for input(s): HGBA1C in the last 72 hours. CBG: Recent Labs  Lab 04/17/20 1230 04/17/20 1358 04/22/20 0913 04/22/20 1031 04/24/20 0455  GLUCAP 84 96 131* 121* 139*   Lipid Profile: No results for input(s): CHOL, HDL, LDLCALC, TRIG, CHOLHDL, LDLDIRECT in the last 72 hours. Thyroid Function Tests: No results for input(s): TSH, T4TOTAL, FREET4, T3FREE, THYROIDAB in the last 72 hours. Anemia Panel: No results for input(s): VITAMINB12, FOLATE, FERRITIN, TIBC, IRON, RETICCTPCT in the last 72 hours. Sepsis Labs: No results for input(s): PROCALCITON, LATICACIDVEN in the last 168 hours.  Recent Results (from the past 240 hour(s))  SARS CORONAVIRUS 2 (TAT 6-24 HRS) Nasopharyngeal Nasopharyngeal Swab     Status: None   Collection Time: 04/16/20  8:20 PM   Specimen: Nasopharyngeal Swab  Result Value Ref Range Status   SARS Coronavirus 2 NEGATIVE NEGATIVE Final    Comment: (NOTE) SARS-CoV-2 target nucleic acids are NOT DETECTED.  The SARS-CoV-2 RNA is generally detectable in upper and lower respiratory specimens during the acute phase of infection. Negative results do not preclude SARS-CoV-2 infection, do not rule out co-infections with other pathogens, and should not be used as the sole basis for treatment or other patient management decisions. Negative results must be  combined with clinical observations, patient history, and epidemiological information. The expected result is Negative.  Fact Sheet for Patients: SugarRoll.be  Fact Sheet for Healthcare Providers: https://www.woods-mathews.com/  This test is not yet approved or cleared by the [place] FDA and  has been authorized for detection and/or diagnosis of SARS-CoV-2 by FDA under an Emergency Use Authorization (EUA). This EUA will remain  in effect (meaning this test can be used) for the duration of the COVID-19 declaration under Se ction 564(b)(1) of the Act, 21 U.S.C. section 360bbb-3(b)(1), unless the authorization is terminated or revoked sooner.  Performed at [facility], [place] [address], [place], [place] [address]          Radiology Studies: CT BONE MARROW BIOPSY & ASPIRATION  Result Date: 04/22/2020 CLINICAL DATA:  Thrombocytopenia. Possible ITP. History of hepatocellular carcinoma. EXAM: CT GUIDED BONE MARROW ASPIRATION AND BIOPSY ANESTHESIA/SEDATION: Versed  1.0 mg IV, Fentanyl 50 mcg IV Total Moderate Sedation Time:   15 minutes. The patient's level of consciousness and physiologic status were continuously monitored during the procedure by Radiology nursing. PROCEDURE: The procedure risks, benefits, and alternatives were explained to the patient. Questions regarding the procedure were encouraged and answered. The patient understands and consents to the procedure. A time out was performed prior to initiating the procedure. The right gluteal region was prepped with chlorhexidine. Sterile gown and sterile gloves were used for the procedure. Local anesthesia was provided with 1% Lidocaine. Under CT guidance, an 11 gauge On Control bone cutting needle was advanced from a posterior approach into the right iliac bone. Needle positioning was confirmed with CT. Initial non heparinized and heparinized aspirate samples were obtained of bone marrow.  Core biopsy was performed via the On Control drill needle. COMPLICATIONS: None FINDINGS: Inspection of initial aspirate did reveal visible particles. Intact core biopsy sample was obtained. IMPRESSION: CT guided bone marrow biopsy of right posterior iliac bone with both aspirate and core samples obtained. Electronically Signed   By: Aletta Edouard M.D.   On: 04/22/2020 11:26   US ABDOMEN LIMITED RUQ (LIVER/GB)  Result Date: 04/24/2020 CLINICAL DATA:  Epigastric pain EXAM: ULTRASOUND ABDOMEN LIMITED RIGHT UPPER QUADRANT COMPARISON:  MRI abdomen 04/17/2020 FINDINGS: Gallbladder: There are sludge and stones in the gallbladder. A negative sonographic Percell Miller sign was reported by the sonographer. No pericholecystic fluid. Mild wall thickening. Common bile duct: Diameter: 6 mm Liver: Redemonstration of mass within the right hepatic lobe, predominantly hyperechoic. This is better characterized on recent MRI. Nodular liver contour. Portal vein is patent on color Doppler imaging with normal direction of blood flow towards the liver. Other: None. IMPRESSION: 1. Cholelithiasis and mild gallbladder wall thickening. No other evidence of acute cholecystitis. 2. Hepatic cirrhosis and right hepatic lobe mass, better characterized on recent abdominal MRI. Electronically Signed   By: Ulyses Jarred M.D.   On: 04/24/2020 02:54        Scheduled Meds: . insulin aspart  0-9 Units Subcutaneous Q4H   Continuous Infusions: . sodium chloride Stopped (04/24/20 0712)  . piperacillin-tazobactam (ZOSYN)  IV       LOS: 0 days    Time spent: 35 mins     Wyvonnia Dusky, MD [facility] Pager [phone]  If 7PM-7AM, please contact night-coverage 04/24/2020, 7:55 AM

## 2020-04-24 NOTE — ED Notes (Signed)
Pt transported to MRI at this time 

## 2020-04-24 NOTE — ED Provider Notes (Signed)
[facility] Emergency Department Provider Note  ____________________________________________  Time seen: Approximately 3:23 AM  I have reviewed the triage vital signs and the nursing notes.   HISTORY  Chief Complaint No chief complaint on file.   HPI Timothy Murray is a 75 y.o. male with a history of CAD status post CABG, cholelithiasis, ascending aortic dissection status post repair, hypertension, hyperlipidemia, diabetes, hepatocellular carcinoma status post radiation therapy who presents for evaluation of abdominal pain.   Patient reports that the pain started after dinner.  He had fish for dinner.  The pain is sharp, severe, located in the epigastric region radiating straight to his back.  He has had nausea but no vomiting.  No fever or chills, no chest pain or shortness of breath.  Pain is identical to prior bouts of pancreatitis.  Past Medical History:  Diagnosis Date  . Arthritis    "lower back" (10/24/2012)  . Coronary artery disease   . GERD (gastroesophageal reflux disease)   . High cholesterol   . Hypertension   . Liver cancer ([place])   . Type II diabetes mellitus [facility])     Patient Active Problem List   Diagnosis Date Noted  . Choledocholithiasis 04/16/2020  . Hepatocellular carcinoma ([place]) 12/26/2019  . Thrombocytopenia ([place]) 12/16/2016  . HTN (hypertension) 10/29/2012  . Diabetes ([place]) 10/29/2012  . Other and unspecified hyperlipidemia 10/29/2012  . CAD (coronary artery disease), native coronary artery 10/29/2012  . S/P CABG x 3 10/29/2012    Past Surgical History:  Procedure Laterality Date  . APPENDECTOMY  2002  . CARDIAC CATHETERIZATION  10/24/2012  . CORONARY ARTERY BYPASS GRAFT N/A 10/26/2012   Procedure: CORONARY ARTERY BYPASS GRAFTING (CABG);  Surgeon: Melrose Nakayama, MD;  Location: [place];  Service: Open Heart Surgery;  Laterality: N/A;  Times 3 using left internal mammary artery and endoscopically harvested right  saphenous vein    Prior to Admission medications   Medication Sig Start Date End Date Taking? Authorizing Provider  aspirin EC 81 MG tablet Take 81 mg by mouth daily.    [provider]  atorvastatin (LIPITOR) 40 MG tablet Take 1 tablet by mouth daily. 11/21/19   [provider]  fluocinonide cream (LIDEX) 0.05 %  11/17/12   [provider]  glimepiride (AMARYL) 1 MG tablet Take 1 mg by mouth daily.    [provider]  glucosamine-chondroitin 500-400 MG tablet Take 1 tablet by mouth daily.    [provider]  losartan (COZAAR) 25 MG tablet Take 25 mg by mouth daily.    [provider]  metoprolol tartrate (LOPRESSOR) 25 MG tablet Take 25 mg by mouth daily.    [provider]  omeprazole (PRILOSEC) 20 MG capsule Take 40 mg by mouth every morning.    [provider]  zolpidem (AMBIEN) 10 MG tablet Take 10 mg by mouth at bedtime as needed for sleep. Patient not taking: No sig reported    [provider]    Allergies Other, Soap & cleansers, and Latex  Family History  Problem Relation Age of Onset  . Cancer Sister     Social History Social History   Tobacco Use  . Smoking status: Former Smoker    Packs/day: 0.50    Years: 30.00    Pack years: 15.00    Types: Cigarettes    Quit date: 08/28/2001    Years since quitting: 18.6  . Smokeless tobacco: Never Used  Substance Use Topics  . Alcohol  use: No    Alcohol/week: 0.0 standard drinks    Comment: 10/24/2012 "stopped drinking alcohol in 2003; used to drink ~ 9 bottles beer/wk"  . Drug use: No    Review of Systems  Constitutional: Negative for fever. Eyes: Negative for visual changes. ENT: Negative for sore throat. Neck: No neck pain  Cardiovascular: Negative for chest pain. Respiratory: Negative for shortness of breath. Gastrointestinal: + epigastric abdominal pain and nausea. No vomiting or diarrhea. Genitourinary: Negative for  dysuria. Musculoskeletal: Negative for back pain. Skin: Negative for rash. Neurological: Negative for headaches, weakness or numbness. Psych: No SI or HI  ____________________________________________   PHYSICAL EXAM:  VITAL SIGNS: ED Triage Vitals [04/24/20 0056]  Enc Vitals Group     BP (!) 143/57     Pulse Rate 78     Resp 16     Temp 97.9 F (36.6 C)     Temp Source Oral     SpO2 100 %     Weight 205 lb (93 kg)     Height 6' (1.829 m)     Head Circumference      Peak Flow      Pain Score 10     Pain Loc      Pain Edu?      Excl. in [place]?     Constitutional: Alert and oriented. Well appearing and in no apparent distress. HEENT:      Head: Normocephalic and atraumatic.         Eyes: Conjunctivae are normal. Sclera is non-icteric.       Mouth/Throat: Mucous membranes are moist.       Neck: Supple with no signs of meningismus. Cardiovascular: Regular rate and rhythm. No murmurs, gallops, or rubs. 2+ symmetrical distal pulses are present in all extremities. No JVD. Respiratory: Normal respiratory effort. Lungs are clear to auscultation bilaterally.  Gastrointestinal: Soft, tender to palpation epigastric region and right upper quadrant with negative Murphy sign. No rebound or guarding. Genitourinary: No CVA tenderness. Musculoskeletal:  No edema, cyanosis, or erythema of extremities. Neurologic: Normal speech and language. Face is symmetric. Moving all extremities. No gross focal neurologic deficits are appreciated. Skin: Skin is warm, dry and intact. No rash noted. Psychiatric: Mood and affect are normal. Speech and behavior are normal.  ____________________________________________   LABS (all labs ordered are listed, but only abnormal results are displayed)  Labs Reviewed  CBC WITH DIFFERENTIAL/PLATELET - Abnormal; Notable for the following components:      Result Value   WBC 2.6 (*)    RBC 2.37 (*)    Hemoglobin 8.2 (*)    HCT 23.6 (*)    MCH 34.6 (*)    RDW  16.3 (*)    Platelets 32 (*)    Lymphs Abs 0.6 (*)    All other components within normal limits  COMPREHENSIVE METABOLIC PANEL - Abnormal; Notable for the following components:   Glucose, Bld 140 (*)    Calcium 8.7 (*)    AST 92 (*)    ALT 63 (*)    Total Bilirubin 4.0 (*)    All other components within normal limits  LIPASE, BLOOD - Abnormal; Notable for the following components:   Lipase 1,132 (*)    All other components within normal limits  TROPONIN I (HIGH SENSITIVITY)   ____________________________________________  EKG  ED ECG REPORT I, Rudene Re, the attending physician, personally viewed and interpreted this ECG.  NSR, rate of 74, no ST elevations or depressions. ____________________________________________  RADIOLOGY  I have personally reviewed the images performed during this visit and I agree with the Radiologist's read.   Interpretation by Radiologist:  US ABDOMEN LIMITED RUQ (LIVER/GB)  Result Date: 04/24/2020 CLINICAL DATA:  Epigastric pain EXAM: ULTRASOUND ABDOMEN LIMITED RIGHT UPPER QUADRANT COMPARISON:  MRI abdomen 04/17/2020 FINDINGS: Gallbladder: There are sludge and stones in the gallbladder. A negative sonographic Percell Miller sign was reported by the sonographer. No pericholecystic fluid. Mild wall thickening. Common bile duct: Diameter: 6 mm Liver: Redemonstration of mass within the right hepatic lobe, predominantly hyperechoic. This is better characterized on recent MRI. Nodular liver contour. Portal vein is patent on color Doppler imaging with normal direction of blood flow towards the liver. Other: None. IMPRESSION: 1. Cholelithiasis and mild gallbladder wall thickening. No other evidence of acute cholecystitis. 2. Hepatic cirrhosis and right hepatic lobe mass, better characterized on recent abdominal MRI. Electronically Signed   By: Ulyses Jarred M.D.   On: 04/24/2020 02:54      ____________________________________________   PROCEDURES  Procedure(s) performed: None Procedures Critical Care performed:  None ____________________________________________   INITIAL IMPRESSION / ASSESSMENT AND PLAN / ED COURSE   75 y.o. male with a history of CAD status post CABG, cholelithiasis, ascending aortic dissection status post repair, hypertension, hyperlipidemia, diabetes, hepatocellular carcinoma status post radiation therapy who presents for evaluation of abdominal pain.  Normal vital signs, well-appearing, tender to palpation in the epigastric and right upper quadrant with no rebound or guarding.  Patient recently admitted for the same and had gallstone pancreatitis.  Labs consistent with recurrent pancreatitis with a lipase of 1132, T bili of 4.0 but he was 6.87 days ago.  Rest of his blood work is within patient's baseline.  Right upper quadrant ultrasound showing cholelithiasis with mild gallbladder wall thickening but no other signs of acute cholecystitis.  Patient has received IV fentanyl and morphine and continues to complain of severe pain.  Will discuss to hospitalist for admission.  Old medical records reviewed.  Seems like patient is not a good surgical candidate because of the scarring from radiation therapy for his liver cancer.      _____________________________________________ Please note:  Patient was evaluated in Emergency Department today for the symptoms described in the history of present illness. Patient was evaluated in the context of the global COVID-19 pandemic, which necessitated consideration that the patient might be at risk for infection with the SARS-CoV-2 virus that causes COVID-19. Institutional protocols and algorithms that pertain to the evaluation of patients at risk for COVID-19 are in a state of rapid change based on information released by regulatory bodies including the CDC and federal and state organizations. These policies and  algorithms were followed during the patient's care in the ED.  Some ED evaluations and interventions may be delayed as a result of limited staffing during the pandemic.   [place] Controlled Substance Database was reviewed by me. ____________________________________________   FINAL CLINICAL IMPRESSION(S) / ED DIAGNOSES   Final diagnoses:  Epigastric abdominal pain  Acute gallstone pancreatitis      NEW MEDICATIONS STARTED DURING THIS VISIT:  ED Discharge Orders    None       Note:  This document was prepared using Dragon voice recognition software and may include unintentional dictation errors.    Alfred Levins, [place], MD 04/24/20 [phone]

## 2020-04-24 NOTE — ED Triage Notes (Signed)
Pt states after he ate dinner he started having pain in the middle of his back, pt was seen here the other day and told he had a gallstone. Pt states he also feels bloated

## 2020-04-24 NOTE — Consult Note (Signed)
Consultation  Referring Provider:     Dr Damita Dunnings Admit date 04/24/20 Consult date        04/24/20 Reason for Consultation:   pancreatitis           HPI:   Timothy Murray is a 75 y.o. male with medical history liver cirrhosis,  for T2N0 hepatocellular carcinoma 03/2019 s/p proton beam radiation x25 sessions- treated in [place], completed 06/2019, HTN, diabetes, pancytopenia s/p bone marrow biopsy for eval of ITP on 4/19, CAD status post CABG x3.  He was admitted last week for gallstone pancreatitis-last week  lipase in the 6000s, and bilirubin of 7, the pain and enzymes improved and he was discharged. At that point there was cholelithiasis but no choledocholithiasis. He was to follow up with his primary gastroenterologist, Dr Allen Norris and continue with plans for already scheduled Egd in May- was to do this a couple months ago but patient cancelled procedure. Patient reports that his pain returned after dinner last night, points to the umbilicus, sharp and radiating into his back. Associated with bloating. Denies NV, dysphagia, acholic stools, melena/hematochezia, further GI issues.  He states he was never told about the cirrhosis and unsure why he would have it. States he is feeling better currently with meds. IVF. The only time he has abdominal pain now is when he takes a deep breath.    Note pancytopenia, ast 92, alt 63, total bilirubin 4. Lipase 1132. Pt/inr last week 15.3/1.2 [place] today: IMPRESSION: 1. Cholelithiasis and mild gallbladder wall thickening. No other evidence of acute cholecystitis. 2. Hepatic cirrhosis and right hepatic lobe mass, better characterized on recent abdominal MRI. MRCP today with the MPRESSION: 1. No significant change in size of centrally necrotic mass in segment 5 of the liver compatible with known hepatocellular carcinoma. Currently measures 4.5 by 3.5 cm. Previously measured 4.3 x 3.6 cm. 2. Interval progression acute pancreatitis with increased edema and surrounding  fluid. No pseudo cyst or signs of pancreatic necrosis. 3. Increase in caliber of the CBD and main pancreatic duct. The distal common bile duct is filled with high T1, low T2 debris/sludge and possibly small stones, new from previous exam. 4. Morphologic features of cirrhosis. 5. Trace free fluid along the pericolic gutters. Do note prior MRIs demonstrate some mild splenomegaly and pergastric varices.  He also had negative HCV, HbSag and HbCab tests 12/21. Stopped etoh many years ago.  He has had opinion with Dr Leamon Arnt [facility] oncology) 1/22 and has been following with Dr Mike Gip locally.  He also had opinion at [facility] for possible cholecystecomy with Dr Molli Knock 1/22 and was not a surgical candidate for CCy due to no convinving evidence of acute cholecystitis at the time. It was noted that he would not be a surgical candidate for any progression of the liver mass due to cirrhosis/portal htn - that targeted radioembolization or chemotherapy could be considered. EGD was recommended for assessment of any PUD.  PREVIOUS ENDOSCOPIES:            EGD & Colonoscopy arranged in [facility] 2019 - patient reports these were done and had negative findings. I do not have these reports.  Past Medical History:  Diagnosis Date  . Arthritis    "lower back" (10/24/2012)  . Coronary artery disease   . GERD (gastroesophageal reflux disease)   . High cholesterol   . Hypertension   . Liver cancer ([place])   . Type II diabetes mellitus ([place])     Past Surgical History:  Procedure Laterality Date  .  APPENDECTOMY  2002  . CARDIAC CATHETERIZATION  10/24/2012  . CORONARY ARTERY BYPASS GRAFT N/A 10/26/2012   Procedure: CORONARY ARTERY BYPASS GRAFTING (CABG);  Surgeon: Melrose Nakayama, MD;  Location: [place];  Service: Open Heart Surgery;  Laterality: N/A;  Times 3 using left internal mammary artery and endoscopically harvested right saphenous vein    Family History  Problem Relation Age of Onset  . Cancer Sister      Social History   Tobacco Use  . Smoking status: Former Smoker    Packs/day: 0.50    Years: 30.00    Pack years: 15.00    Types: Cigarettes    Quit date: 08/28/2001    Years since quitting: 18.6  . Smokeless tobacco: Never Used  Substance Use Topics  . Alcohol use: No    Alcohol/week: 0.0 standard drinks    Comment: 10/24/2012 "stopped drinking alcohol in 2003; used to drink ~ 9 bottles beer/wk"  . Drug use: No    Prior to Admission medications   Medication Sig Start Date End Date Taking? Authorizing Provider  aspirin EC 81 MG tablet Take 81 mg by mouth daily.   Yes [provider]  atorvastatin (LIPITOR) 40 MG tablet Take 1 tablet by mouth daily. 11/21/19  Yes [provider]  glimepiride (AMARYL) 1 MG tablet Take 1 mg by mouth daily.   Yes [provider]  glucosamine-chondroitin 500-400 MG tablet Take 1 tablet by mouth daily.   Yes [provider]  losartan (COZAAR) 25 MG tablet Take 25 mg by mouth daily.   Yes [provider]  metoprolol tartrate (LOPRESSOR) 25 MG tablet Take 25 mg by mouth daily.   Yes [provider]  omeprazole (PRILOSEC) 20 MG capsule Take 40 mg by mouth every morning.   Yes [provider]    Current Facility-Administered Medications  Medication Dose Route Frequency Provider Last Rate Last Admin  . acetaminophen (TYLENOL) tablet 650 mg  650 mg Oral Q6H PRN Athena Masse, MD       Or  . acetaminophen (TYLENOL) suppository 650 mg  650 mg Rectal Q6H PRN Athena Masse, MD      . dextrose 5 %-0.9 % sodium chloride infusion   Intravenous Continuous Wyvonnia Dusky, MD 75 mL/hr at 04/24/20 0940 New Bag at 04/24/20 0940  . HYDROmorphone (DILAUDID) injection 0.5-1 mg  0.5-1 mg Intravenous Q2H PRN Athena Masse, MD   1 mg at 04/24/20 0853  . insulin aspart (novoLOG) injection 0-9 Units  0-9 Units Subcutaneous Q4H Judd Gaudier V, MD      . ondansetron [facility]) tablet 4 mg  4 mg Oral Q6H  PRN Athena Masse, MD       Or  . ondansetron [facility]) injection 4 mg  4 mg Intravenous Q6H PRN Athena Masse, MD      . oxyCODONE (Oxy IR/ROXICODONE) immediate release tablet 10 mg  10 mg Oral Q6H PRN Wyvonnia Dusky, MD      . piperacillin-tazobactam (ZOSYN) IVPB 3.375 g  3.375 g Intravenous Q8H Renda Rolls, RPH        Allergies as of 04/24/2020 - Review Complete 04/24/2020  Allergen Reaction Noted  . Other Nausea And Vomiting 01/16/2015  . Soap & cleansers  10/24/2012  . Latex Rash and Itching 10/24/2012     Review of Systems:    All systems reviewed and negative except where noted in HPI.    Physical Exam:  Vital signs in  last 24 hours: Temp:  [97.6 F (36.4 C)-97.9 F (36.6 C)] 97.6 F (36.4 C) (04/21 0820) Pulse Rate:  [68-78] 77 (04/21 0820) Resp:  [16-20] 17 (04/21 0820) BP: (128-143)/(57-65) 138/65 (04/21 0820) SpO2:  [98 %-100 %] 100 % (04/21 0820) Weight:  [93 kg] 93 kg (04/21 0056)   General:   Pleasant man in NAD Head:  Normocephalic and atraumatic. Eyes:   Scleral icterus.   Conjunctiva pink. Ears:  Normal auditory acuity. Mouth: Mucosa pink moist, no lesions. Neck:  Supple; no masses felt Lungs:  Respirations even and unlabored. Lungs clear to auscultation bilaterally.   No wheezes, crackles, or rhonchi.  Heart:  S1S2, RRR, no MRG. No edema. Abdomen:   Flat, soft, nondistended, mild left periumbilical tenderness. Normal bowel sounds. No appreciable masses or hepatomegaly. No rebound signs or other peritoneal signs. Rectal:  Not performed.  Msk:  MAEW x4, No clubbing or cyanosis. Strength 5/5. Symmetrical without gross deformities. Neurologic:  Alert and  oriented x4;  Cranial nerves II-XII intact.  Skin:  Jaundice. Warm, dry, pink without significant lesions or rashes. Psych:  Alert and cooperative. Normal affect.  LAB RESULTS: Recent Labs    04/22/20 0900 04/24/20 0147  WBC 2.1* 2.6*  HGB 8.8* 8.2*  HCT 25.0* 23.6*  PLT 33* 32*    BMET Recent Labs    04/24/20 0147  NA 139  K 3.5  CL 105  CO2 26  GLUCOSE 140*  BUN 12  CREATININE 1.23  CALCIUM 8.7*   LFT Recent Labs    04/24/20 0147  PROT 6.8  ALBUMIN 3.8  AST 92*  ALT 63*  ALKPHOS 123  BILITOT 4.0*   PT/INR No results for input(s): LABPROT, INR in the last 72 hours.  STUDIES: MR 3D Recon At Scanner  Result Date: 04/24/2020 CLINICAL DATA:  Pancreatitis.  History of hepatocellular carcinoma. EXAM: MRI ABDOMEN WITHOUT AND WITH CONTRAST (INCLUDING MRCP) TECHNIQUE: Multiplanar multisequence MR imaging of the abdomen was performed both before and after the administration of intravenous contrast. Heavily T2-weighted images of the biliary and pancreatic ducts were obtained, and three-dimensional MRCP images were rendered by post processing. CONTRAST:  80m GADAVIST GADOBUTROL 1 MMOL/ML IV SOLN COMPARISON:  04/17/2020. FINDINGS: Lower chest: No acute findings. Hepatobiliary: Morphologic features of cirrhosis are again noted involving the liver. The centrally necrotic mass in segment 5 of the liver with 7 mm of enhancing marginal thickness is again noted, image 29/17. On today's study this measures 4.5 by 3.5 cm, image 29/17. Previously 4.3 x 3.6 cm. High T1 signal within the gallbladder is again noted. On the MRCP images in the CBD measures 0.9 cm, image 6/14. This is compared with 5 mm on 04/17/2020. High T1 signal and low T2 signal sludge/debris (and possibly small stones) fill the distal common bile duct, image 14/3. Pancreas: There is diffuse pancreatic edema and peripancreatic fluid compatible with acute pancreatitis. This is increased when compared with the previous exam. Interval increase in caliber of the main pancreatic duct which measures 4 mm, image 22/8. Previously 2 mm. No discrete mature fluid collections identified to suggest pseudocyst. No signs of pancreatic necrosis. Spleen: Spleen has a normal size measuring 10 cm in length. No focal splenic  abnormality. Adrenals/Urinary Tract: Normal appearance of the adrenal glands. No hydronephrosis identified bilaterally. Upper pole left kidney cyst measures 1.8 cm. Stomach/Bowel: Visualized portions within the abdomen are unremarkable. Vascular/Lymphatic: Aortic atherosclerosis. No aneurysm portal vein and splenic vein remain patent. No abdominal adenopathy. Other: Trace free  fluid along the pericolic gutters. No discrete, drainable fluid collection identified. Musculoskeletal: No suspicious bone lesions identified. IMPRESSION: 1. No significant change in size of centrally necrotic mass in segment 5 of the liver compatible with known hepatocellular carcinoma. Currently measures 4.5 by 3.5 cm. Previously measured 4.3 x 3.6 cm. 2. Interval progression acute pancreatitis with increased edema and surrounding fluid. No pseudo cyst or signs of pancreatic necrosis. 3. Increase in caliber of the CBD and main pancreatic duct. The distal common bile duct is filled with high T1, low T2 debris/sludge and possibly small stones, new from previous exam. 4. Morphologic features of cirrhosis. 5. Trace free fluid along the pericolic gutters. Electronically Signed   By: Kerby Moors M.D.   On: 04/24/2020 08:21   MR ABDOMEN MRCP W WO CONTAST  Result Date: 04/24/2020 CLINICAL DATA:  Pancreatitis.  History of hepatocellular carcinoma. EXAM: MRI ABDOMEN WITHOUT AND WITH CONTRAST (INCLUDING MRCP) TECHNIQUE: Multiplanar multisequence MR imaging of the abdomen was performed both before and after the administration of intravenous contrast. Heavily T2-weighted images of the biliary and pancreatic ducts were obtained, and three-dimensional MRCP images were rendered by post processing. CONTRAST:  96m GADAVIST GADOBUTROL 1 MMOL/ML IV SOLN COMPARISON:  04/17/2020. FINDINGS: Lower chest: No acute findings. Hepatobiliary: Morphologic features of cirrhosis are again noted involving the liver. The centrally necrotic mass in segment 5 of the  liver with 7 mm of enhancing marginal thickness is again noted, image 29/17. On today's study this measures 4.5 by 3.5 cm, image 29/17. Previously 4.3 x 3.6 cm. High T1 signal within the gallbladder is again noted. On the MRCP images in the CBD measures 0.9 cm, image 6/14. This is compared with 5 mm on 04/17/2020. High T1 signal and low T2 signal sludge/debris (and possibly small stones) fill the distal common bile duct, image 14/3. Pancreas: There is diffuse pancreatic edema and peripancreatic fluid compatible with acute pancreatitis. This is increased when compared with the previous exam. Interval increase in caliber of the main pancreatic duct which measures 4 mm, image 22/8. Previously 2 mm. No discrete mature fluid collections identified to suggest pseudocyst. No signs of pancreatic necrosis. Spleen: Spleen has a normal size measuring 10 cm in length. No focal splenic abnormality. Adrenals/Urinary Tract: Normal appearance of the adrenal glands. No hydronephrosis identified bilaterally. Upper pole left kidney cyst measures 1.8 cm. Stomach/Bowel: Visualized portions within the abdomen are unremarkable. Vascular/Lymphatic: Aortic atherosclerosis. No aneurysm portal vein and splenic vein remain patent. No abdominal adenopathy. Other: Trace free fluid along the pericolic gutters. No discrete, drainable fluid collection identified. Musculoskeletal: No suspicious bone lesions identified. IMPRESSION: 1. No significant change in size of centrally necrotic mass in segment 5 of the liver compatible with known hepatocellular carcinoma. Currently measures 4.5 by 3.5 cm. Previously measured 4.3 x 3.6 cm. 2. Interval progression acute pancreatitis with increased edema and surrounding fluid. No pseudo cyst or signs of pancreatic necrosis. 3. Increase in caliber of the CBD and main pancreatic duct. The distal common bile duct is filled with high T1, low T2 debris/sludge and possibly small stones, new from previous exam. 4.  Morphologic features of cirrhosis. 5. Trace free fluid along the pericolic gutters. Electronically Signed   By: TKerby MoorsM.D.   On: 04/24/2020 08:21   [place] LIMITED RUQ (LIVER/GB)  Result Date: 04/24/2020 CLINICAL DATA:  Epigastric pain EXAM: ULTRASOUND ABDOMEN LIMITED RIGHT UPPER QUADRANT COMPARISON:  MRI abdomen 04/17/2020 FINDINGS: Gallbladder: There are sludge and stones in the gallbladder. A negative  sonographic Percell Miller sign was reported by the sonographer. No pericholecystic fluid. Mild wall thickening. Common bile duct: Diameter: 6 mm Liver: Redemonstration of mass within the right hepatic lobe, predominantly hyperechoic. This is better characterized on recent MRI. Nodular liver contour. Portal vein is patent on color Doppler imaging with normal direction of blood flow towards the liver. Other: None. IMPRESSION: 1. Cholelithiasis and mild gallbladder wall thickening. No other evidence of acute cholecystitis. 2. Hepatic cirrhosis and right hepatic lobe mass, better characterized on recent abdominal MRI. Electronically Signed   By: Ulyses Jarred M.D.   On: 04/24/2020 02:54       Impression / Plan:   1. Gallstone pancreatitis- MRCP findings reviewed as above- given the increased caliber of CBD and sludge/likely small stones we are recommending ERCP, and contacting Dr Allen Norris. This was discussed with patient and he is agreeable if necessary. 2. Cirrhosis- pt/inr today. Recommend hepatology follow up.   Thank you very much for this consult. These services were provided by Stephens November, NP-C, in collaboration with Lesly Rubenstein MD, with whom I have discussed this patient in full.   Stephens November, NP-C

## 2020-04-25 ENCOUNTER — Encounter
Admission: EM | Disposition: A | Discharge status: Home / Self Care | Payer: Self-pay | Source: Home / Self Care | Attending: Internal Medicine

## 2020-04-25 ENCOUNTER — Inpatient Hospital Stay: Payer: Medicare HMO

## 2020-04-25 ENCOUNTER — Encounter: Payer: Self-pay | Admitting: Internal Medicine

## 2020-04-25 ENCOUNTER — Inpatient Hospital Stay: Payer: Medicare HMO | Admitting: Registered Nurse

## 2020-04-25 DIAGNOSIS — C220 Liver cell carcinoma: Secondary | ICD-10-CM | POA: Diagnosis not present

## 2020-04-25 DIAGNOSIS — K861 Other chronic pancreatitis: Secondary | ICD-10-CM

## 2020-04-25 DIAGNOSIS — K7460 Unspecified cirrhosis of liver: Secondary | ICD-10-CM

## 2020-04-25 DIAGNOSIS — D61818 Other pancytopenia: Secondary | ICD-10-CM | POA: Diagnosis not present

## 2020-04-25 DIAGNOSIS — R7401 Elevation of levels of liver transaminase levels: Secondary | ICD-10-CM

## 2020-04-25 DIAGNOSIS — I10 Essential (primary) hypertension: Secondary | ICD-10-CM

## 2020-04-25 DIAGNOSIS — Z951 Presence of aortocoronary bypass graft: Secondary | ICD-10-CM

## 2020-04-25 DIAGNOSIS — K8050 Calculus of bile duct without cholangitis or cholecystitis without obstruction: Secondary | ICD-10-CM

## 2020-04-25 DIAGNOSIS — K851 Biliary acute pancreatitis without necrosis or infection: Secondary | ICD-10-CM | POA: Diagnosis not present

## 2020-04-25 DIAGNOSIS — Z8679 Personal history of other diseases of the circulatory system: Secondary | ICD-10-CM

## 2020-04-25 DIAGNOSIS — E119 Type 2 diabetes mellitus without complications: Secondary | ICD-10-CM

## 2020-04-25 HISTORY — PX: ERCP: SHX5425

## 2020-04-25 LAB — COMPREHENSIVE METABOLIC PANEL
ALT: 71 U/L — ABNORMAL HIGH (ref 0–44)
AST: 79 U/L — ABNORMAL HIGH (ref 15–41)
Albumin: 3.2 g/dL — ABNORMAL LOW (ref 3.5–5.0)
Alkaline Phosphatase: 115 U/L (ref 38–126)
Anion gap: 7 (ref 5–15)
BUN: 8 mg/dL (ref 8–23)
CO2: 26 mmol/L (ref 22–32)
Calcium: 8.2 mg/dL — ABNORMAL LOW (ref 8.9–10.3)
Chloride: 106 mmol/L (ref 98–111)
Creatinine, Ser: 1 mg/dL (ref 0.61–1.24)
GFR, Estimated: >60 mL/min (ref >60)
Glucose, Bld: 96 mg/dL (ref 70–99)
Potassium: 3.7 mmol/L (ref 3.5–5.1)
Sodium: 139 mmol/L (ref 135–145)
Total Bilirubin: 6.3 mg/dL — ABNORMAL HIGH (ref 0.3–1.2)
Total Protein: 5.7 g/dL — ABNORMAL LOW (ref 6.5–8.1)

## 2020-04-25 LAB — GLUCOSE, CAPILLARY
Glucose-Capillary: 101 mg/dL — ABNORMAL HIGH (ref 70–99)
Glucose-Capillary: 103 mg/dL — ABNORMAL HIGH (ref 70–99)
Glucose-Capillary: 249 mg/dL — ABNORMAL HIGH (ref 70–99)
Glucose-Capillary: 81 mg/dL (ref 70–99)
Glucose-Capillary: 92 mg/dL (ref 70–99)
Glucose-Capillary: 96 mg/dL (ref 70–99)

## 2020-04-25 LAB — TYPE AND SCREEN
ABO/RH(D): A POS
Antibody Screen: NEGATIVE

## 2020-04-25 LAB — PLATELET COUNT: Platelets: 40 10*3/uL — ABNORMAL LOW (ref 150–400)

## 2020-04-25 LAB — CBC
HCT: 20.7 % — ABNORMAL LOW (ref 39.0–52.0)
Hemoglobin: 7.2 g/dL — ABNORMAL LOW (ref 13.0–17.0)
MCH: 34.8 pg — ABNORMAL HIGH (ref 26.0–34.0)
MCHC: 34.8 g/dL (ref 30.0–36.0)
MCV: 100 fL (ref 80.0–100.0)
Platelets: 25 10*3/uL — CL (ref 150–400)
RBC: 2.07 MIL/uL — ABNORMAL LOW (ref 4.22–5.81)
RDW: 16.5 % — ABNORMAL HIGH (ref 11.5–15.5)
WBC: 2.2 10*3/uL — ABNORMAL LOW (ref 4.0–10.5)
nRBC: 0 % (ref 0.0–0.2)

## 2020-04-25 LAB — ABO/RH: ABO/RH(D): A POS

## 2020-04-25 LAB — LIPASE, BLOOD: Lipase: 183 U/L — ABNORMAL HIGH (ref 11–51)

## 2020-04-25 LAB — AMMONIA: Ammonia: 19 umol/L (ref 9–35)

## 2020-04-25 SURGERY — ERCP, WITH INTERVENTION IF INDICATED
Anesthesia: General

## 2020-04-25 MED ORDER — DEXAMETHASONE SODIUM PHOSPHATE 10 MG/ML IJ SOLN
INTRAMUSCULAR | Status: AC
  Filled 2020-04-25: qty 1

## 2020-04-25 MED ORDER — ONDANSETRON HCL 4 MG/2ML IJ SOLN
INTRAMUSCULAR | Status: AC
  Filled 2020-04-25: qty 2

## 2020-04-25 MED ORDER — FENTANYL CITRATE (PF) 100 MCG/2ML IJ SOLN
INTRAMUSCULAR | Status: AC
  Filled 2020-04-25: qty 2

## 2020-04-25 MED ORDER — HYDRALAZINE HCL 20 MG/ML IJ SOLN: 10.0000 mg | Freq: Three times a day (TID) | INTRAMUSCULAR | Status: DC | PRN

## 2020-04-25 MED ORDER — OXYCODONE HCL 5 MG/5ML PO SOLN: 5.0000 mg | Freq: Once | ORAL | Status: DC | PRN

## 2020-04-25 MED ORDER — MEPERIDINE HCL 25 MG/ML IJ SOLN
6.2500 mg | INTRAMUSCULAR | Status: DC | PRN
  Filled 2020-04-25: qty 1

## 2020-04-25 MED ORDER — PROMETHAZINE HCL 25 MG/ML IJ SOLN: 6.2500 mg | INTRAMUSCULAR | Status: DC | PRN

## 2020-04-25 MED ORDER — ONDANSETRON HCL 4 MG/2ML IJ SOLN
INTRAMUSCULAR | Status: DC | PRN
  Administered 2020-04-25: 4 mg via INTRAVENOUS

## 2020-04-25 MED ORDER — LACTATED RINGERS IV SOLN: INTRAVENOUS | Status: DC

## 2020-04-25 MED ORDER — ROCURONIUM BROMIDE 10 MG/ML (PF) SYRINGE
PREFILLED_SYRINGE | INTRAVENOUS | Status: AC
  Filled 2020-04-25: qty 10

## 2020-04-25 MED ORDER — DEXAMETHASONE SODIUM PHOSPHATE 10 MG/ML IJ SOLN
INTRAMUSCULAR | Status: DC | PRN
  Administered 2020-04-25: 10 mg via INTRAVENOUS

## 2020-04-25 MED ORDER — FENTANYL CITRATE (PF) 100 MCG/2ML IJ SOLN
INTRAMUSCULAR | Status: DC | PRN
  Administered 2020-04-25 (×2): 50 ug via INTRAVENOUS

## 2020-04-25 MED ORDER — SODIUM CHLORIDE 0.9% IV SOLUTION: Freq: Once | INTRAVENOUS | Status: DC

## 2020-04-25 MED ORDER — INDOMETHACIN 50 MG RE SUPP
RECTAL | Status: AC
  Filled 2020-04-25: qty 2

## 2020-04-25 MED ORDER — LIDOCAINE HCL (CARDIAC) PF 100 MG/5ML IV SOSY
PREFILLED_SYRINGE | INTRAVENOUS | Status: DC | PRN
  Administered 2020-04-25: 100 mg via INTRAVENOUS

## 2020-04-25 MED ORDER — ROCURONIUM BROMIDE 100 MG/10ML IV SOLN
INTRAVENOUS | Status: DC | PRN
  Administered 2020-04-25: 20 mg via INTRAVENOUS

## 2020-04-25 MED ORDER — SUCCINYLCHOLINE CHLORIDE 20 MG/ML IJ SOLN
INTRAMUSCULAR | Status: DC | PRN
  Administered 2020-04-25: 120 mg via INTRAVENOUS

## 2020-04-25 MED ORDER — PROPOFOL 10 MG/ML IV BOLUS
INTRAVENOUS | Status: DC | PRN
  Administered 2020-04-25: 150 mg via INTRAVENOUS

## 2020-04-25 MED ORDER — FENTANYL CITRATE (PF) 100 MCG/2ML IJ SOLN: 25.0000 ug | INTRAMUSCULAR | Status: DC | PRN

## 2020-04-25 MED ORDER — SUGAMMADEX SODIUM 200 MG/2ML IV SOLN
INTRAVENOUS | Status: DC | PRN
  Administered 2020-04-25: 200 mg via INTRAVENOUS

## 2020-04-25 MED ORDER — OXYCODONE HCL 5 MG PO TABS: 5.0000 mg | ORAL_TABLET | Freq: Once | ORAL | Status: DC | PRN

## 2020-04-25 MED ORDER — INDOMETHACIN 50 MG RE SUPP
100.0000 mg | Freq: Once | RECTAL | Status: AC
  Administered 2020-04-25: 100 mg via RECTAL

## 2020-04-25 NOTE — Progress Notes (Signed)
I will be seeing patient closer to lunch. I have reviewed his chart and prior notes by Dr. Corcoran. Recent bone marrow biopsy did not reveal any primary bone marrow disorder. Pancytopenia likely due to cirrhosis.  Patient ideally needs avotrombopag electively for 5 days prior to procedure but we dont have time for that. Recommend 1 unit of platelets to start around 12 noon and check post plateelt coutn right after the transfusion. Hang second bag of paltelets during procedure.  Dr. Wohl plans to do ercp and stenting but hold off on sphincterotomy today given bleeding risk. Patient will likely need a second procedure electively when he can receive avotrombopag  Dr.  , MD, MPH [facility] Pager- [phone] 04/25/2020 8:57 AM   

## 2020-04-25 NOTE — Progress Notes (Signed)
PROGRESS NOTE    Timothy Murray  ULA:[id number] DOB: 01/25/1945 DOA: 04/24/2020 PCP: Danelle Berry, NP  Assessment & Plan:   Principal Problem:   Acute pancreatitis Active Problems:   HTN (hypertension)   Diabetes ([place])   CAD (coronary artery disease), native coronary artery   S/P CABG x 3   Hepatocellular carcinoma (H[facility])   Choledocholithiasis   Pancytopenia ([place])   Cirrhosis of liver on imaging, likely post proton-beam radiation ([place])   Recurrent pancreatitis: w/ possible choledocholithiasis. Continue on IV zosyn. Lipase is still elevated but trending down. Repeat MRCP shows interval progression acute pancreatitis w/ increased edema & surrounding fluid. No pseudo cysts or signs of pancreatic necrosis. Increase in caliber of the CBD and main pancreatic duct. The distal common bile duct is filled with high T1, low T2 debris/sludge and possibly small stones, new from previous exam. No significant change in size of centrally necrotic mass in segment 5 of the liver compatible with known hepatocellular carcinoma. Will go for ERCP today as per GI.   Hepatocellular carcinoma: dx March 2021. S/p proton beam radiation x 25 sessions & completed 06/2019. Onco consulted   HTN: IV hydralazine prn. Will continue to hold all anti-HTN po meds while pt is NPO   DM2: likely well controlled. Continue on SSI w/ accuchecks   Hx of CAD: s/p CABG in 2014. Hx penetrating aortic ulcer s/p TEVAR.   Pancytopenia: will receive 2 packets of platelets today as per GI request. S/p bone marrow biopsy on 04/22/20 as per Dr. Mike Gip. Results to be discussed by onco   Cirrhosis: continue w/ supportive care. Not on lactulose or rifaximin.   Transaminitis: likely secondary to hepatocellular carcinoma & cirrhosis. Continue to hold statin    DVT prophylaxis: SCDs  Code Status: full  Family Communication:  Disposition Plan: likely d/c back home .  Level of care: Med-Surg   Status is: Inpatient  Remains  inpatient appropriate because:Ongoing diagnostic testing needed not appropriate for outpatient work up, Unsafe d/c plan, IV treatments appropriate due to intensity of illness or inability to take PO and Inpatient level of care appropriate due to severity of illness   Dispo: The patient is from: Home              Anticipated d/c is to: Home              Patient currently is not medically stable to d/c.   Difficult to place patient : unclear       Consultants:  GI    Procedures:   Antimicrobials: zosyn    Subjective: Pt c/o hearing palpations in his head   Objective: Vitals:   04/24/20 1355 04/24/20 1511 04/24/20 2031 04/25/20 0441  BP: (!) 138/52 125/62 (!) 145/57 (!) 103/36  Pulse: 81 74 80 78  Resp: 20 17 18 16   Temp: 98.3 F (36.8 C) 98.5 F (36.9 C) 98.4 F (36.9 C) 99.4 F (37.4 C)  TempSrc:  Oral Oral Oral  SpO2: 100% 100% 100% 95%  Weight:      Height:       No intake or output data in the 24 hours ending 04/25/20 0717 Filed Weights   04/24/20 0056  Weight: 93 kg    Examination:  General exam: Appears comfortable  Respiratory system: clear breath sounds b/l  Cardiovascular system: S1/S2+. No rubs or clicks  Gastrointestinal system: Abd is soft, NT, ND & hypoactive bowel sounds  Central nervous system: Alert and oriented. Moves all 4 extremities  Psychiatry: Judgement and insight appear normal. Flat mood and affect     Data Reviewed: I have personally reviewed following labs and imaging studies  CBC: Recent Labs  Lab 04/22/20 0900 04/24/20 0147 04/25/20 0447  WBC 2.1* 2.6* 2.2*  NEUTROABS 1.3* 1.7  --   HGB 8.8* 8.2* 7.2*  HCT 25.0* 23.6* 20.7*  MCV 98.0 99.6 100.0  PLT 33* 32* 25*   Basic Metabolic Panel: Recent Labs  Lab 04/24/20 0147 04/25/20 0447  NA 139 139  K 3.5 3.7  CL 105 106  CO2 26 26  GLUCOSE 140* 96  BUN 12 8  CREATININE 1.23 1.00  CALCIUM 8.7* 8.2*   GFR: Estimated Creatinine Clearance: 71.1 mL/min (by C-G  formula based on SCr of 1 mg/dL). Liver Function Tests: Recent Labs  Lab 04/24/20 0147 04/25/20 0447  AST 92* 79*  ALT 63* 71*  ALKPHOS 123 115  BILITOT 4.0* 6.3*  PROT 6.8 5.7*  ALBUMIN 3.8 3.2*   Recent Labs  Lab 04/24/20 0147 04/25/20 0447  LIPASE 1,132* 183*   Recent Labs  Lab 04/25/20 0447  AMMONIA 19   Coagulation Profile: Recent Labs  Lab 04/24/20 1352  INR 1.1   Cardiac Enzymes: No results for input(s): CKTOTAL, CKMB, CKMBINDEX, TROPONINI in the last 168 hours. BNP (last 3 results) No results for input(s): PROBNP in the last 8760 hours. HbA1C: No results for input(s): HGBA1C in the last 72 hours. CBG: Recent Labs  Lab 04/24/20 1327 04/24/20 1513 04/24/20 2013 04/25/20 0047 04/25/20 0444  GLUCAP 73 95 70 103* 96   Lipid Profile: No results for input(s): CHOL, HDL, LDLCALC, TRIG, CHOLHDL, LDLDIRECT in the last 72 hours. Thyroid Function Tests: No results for input(s): TSH, T4TOTAL, FREET4, T3FREE, THYROIDAB in the last 72 hours. Anemia Panel: No results for input(s): VITAMINB12, FOLATE, FERRITIN, TIBC, IRON, RETICCTPCT in the last 72 hours. Sepsis Labs: No results for input(s): PROCALCITON, LATICACIDVEN in the last 168 hours.  Recent Results (from the past 240 hour(s))  SARS CORONAVIRUS 2 (TAT 6-24 HRS) Nasopharyngeal Nasopharyngeal Swab     Status: None   Collection Time: 04/16/20  8:20 PM   Specimen: Nasopharyngeal Swab  Result Value Ref Range Status   SARS Coronavirus 2 NEGATIVE NEGATIVE Final    Comment: (NOTE) SARS-CoV-2 target nucleic acids are NOT DETECTED.  The SARS-CoV-2 RNA is generally detectable in upper and lower respiratory specimens during the acute phase of infection. Negative results do not preclude SARS-CoV-2 infection, do not rule out co-infections with other pathogens, and should not be used as the sole basis for treatment or other patient management decisions. Negative results must be combined with clinical  observations, patient history, and epidemiological information. The expected result is Negative.  Fact Sheet for Patients: SugarRoll.be  Fact Sheet for Healthcare Providers: https://www.woods-mathews.com/  This test is not yet approved or cleared by the [place] FDA and  has been authorized for detection and/or diagnosis of SARS-CoV-2 by FDA under an Emergency Use Authorization (EUA). This EUA will remain  in effect (meaning this test can be used) for the duration of the COVID-19 declaration under Se ction 564(b)(1) of the Act, 21 U.S.C. section 360bbb-3(b)(1), unless the authorization is terminated or revoked sooner.  Performed at [facility], [place] [address] [place], [place] [address]          Radiology Studies: MR 3D Recon At Scanner  Result Date: 04/24/2020 CLINICAL DATA:  Pancreatitis.  History of hepatocellular carcinoma. EXAM: MRI ABDOMEN WITHOUT AND  WITH CONTRAST (INCLUDING MRCP) TECHNIQUE: Multiplanar multisequence MR imaging of the abdomen was performed both before and after the administration of intravenous contrast. Heavily T2-weighted images of the biliary and pancreatic ducts were obtained, and three-dimensional MRCP images were rendered by post processing. CONTRAST:  60m GADAVIST GADOBUTROL 1 MMOL/ML IV SOLN COMPARISON:  04/17/2020. FINDINGS: Lower chest: No acute findings. Hepatobiliary: Morphologic features of cirrhosis are again noted involving the liver. The centrally necrotic mass in segment 5 of the liver with 7 mm of enhancing marginal thickness is again noted, image 29/17. On today's study this measures 4.5 by 3.5 cm, image 29/17. Previously 4.3 x 3.6 cm. High T1 signal within the gallbladder is again noted. On the MRCP images in the CBD measures 0.9 cm, image 6/14. This is compared with 5 mm on 04/17/2020. High T1 signal and low T2 signal sludge/debris (and possibly small stones) fill the distal common bile  duct, image 14/3. Pancreas: There is diffuse pancreatic edema and peripancreatic fluid compatible with acute pancreatitis. This is increased when compared with the previous exam. Interval increase in caliber of the main pancreatic duct which measures 4 mm, image 22/8. Previously 2 mm. No discrete mature fluid collections identified to suggest pseudocyst. No signs of pancreatic necrosis. Spleen: Spleen has a normal size measuring 10 cm in length. No focal splenic abnormality. Adrenals/Urinary Tract: Normal appearance of the adrenal glands. No hydronephrosis identified bilaterally. Upper pole left kidney cyst measures 1.8 cm. Stomach/Bowel: Visualized portions within the abdomen are unremarkable. Vascular/Lymphatic: Aortic atherosclerosis. No aneurysm portal vein and splenic vein remain patent. No abdominal adenopathy. Other: Trace free fluid along the pericolic gutters. No discrete, drainable fluid collection identified. Musculoskeletal: No suspicious bone lesions identified. IMPRESSION: 1. No significant change in size of centrally necrotic mass in segment 5 of the liver compatible with known hepatocellular carcinoma. Currently measures 4.5 by 3.5 cm. Previously measured 4.3 x 3.6 cm. 2. Interval progression acute pancreatitis with increased edema and surrounding fluid. No pseudo cyst or signs of pancreatic necrosis. 3. Increase in caliber of the CBD and main pancreatic duct. The distal common bile duct is filled with high T1, low T2 debris/sludge and possibly small stones, new from previous exam. 4. Morphologic features of cirrhosis. 5. Trace free fluid along the pericolic gutters. Electronically Signed   By: TKerby MoorsM.D.   On: 04/24/2020 08:21   MR ABDOMEN MRCP W WO CONTAST  Result Date: 04/24/2020 CLINICAL DATA:  Pancreatitis.  History of hepatocellular carcinoma. EXAM: MRI ABDOMEN WITHOUT AND WITH CONTRAST (INCLUDING MRCP) TECHNIQUE: Multiplanar multisequence MR imaging of the abdomen was performed  both before and after the administration of intravenous contrast. Heavily T2-weighted images of the biliary and pancreatic ducts were obtained, and three-dimensional MRCP images were rendered by post processing. CONTRAST:  937mGADAVIST GADOBUTROL 1 MMOL/ML IV SOLN COMPARISON:  04/17/2020. FINDINGS: Lower chest: No acute findings. Hepatobiliary: Morphologic features of cirrhosis are again noted involving the liver. The centrally necrotic mass in segment 5 of the liver with 7 mm of enhancing marginal thickness is again noted, image 29/17. On today's study this measures 4.5 by 3.5 cm, image 29/17. Previously 4.3 x 3.6 cm. High T1 signal within the gallbladder is again noted. On the MRCP images in the CBD measures 0.9 cm, image 6/14. This is compared with 5 mm on 04/17/2020. High T1 signal and low T2 signal sludge/debris (and possibly small stones) fill the distal common bile duct, image 14/3. Pancreas: There is diffuse pancreatic edema and peripancreatic fluid compatible  with acute pancreatitis. This is increased when compared with the previous exam. Interval increase in caliber of the main pancreatic duct which measures 4 mm, image 22/8. Previously 2 mm. No discrete mature fluid collections identified to suggest pseudocyst. No signs of pancreatic necrosis. Spleen: Spleen has a normal size measuring 10 cm in length. No focal splenic abnormality. Adrenals/Urinary Tract: Normal appearance of the adrenal glands. No hydronephrosis identified bilaterally. Upper pole left kidney cyst measures 1.8 cm. Stomach/Bowel: Visualized portions within the abdomen are unremarkable. Vascular/Lymphatic: Aortic atherosclerosis. No aneurysm portal vein and splenic vein remain patent. No abdominal adenopathy. Other: Trace free fluid along the pericolic gutters. No discrete, drainable fluid collection identified. Musculoskeletal: No suspicious bone lesions identified. IMPRESSION: 1. No significant change in size of centrally necrotic mass in  segment 5 of the liver compatible with known hepatocellular carcinoma. Currently measures 4.5 by 3.5 cm. Previously measured 4.3 x 3.6 cm. 2. Interval progression acute pancreatitis with increased edema and surrounding fluid. No pseudo cyst or signs of pancreatic necrosis. 3. Increase in caliber of the CBD and main pancreatic duct. The distal common bile duct is filled with high T1, low T2 debris/sludge and possibly small stones, new from previous exam. 4. Morphologic features of cirrhosis. 5. Trace free fluid along the pericolic gutters. Electronically Signed   By: Kerby Moors M.D.   On: 04/24/2020 08:21   US ABDOMEN LIMITED RUQ (LIVER/GB)  Result Date: 04/24/2020 CLINICAL DATA:  Epigastric pain EXAM: ULTRASOUND ABDOMEN LIMITED RIGHT UPPER QUADRANT COMPARISON:  MRI abdomen 04/17/2020 FINDINGS: Gallbladder: There are sludge and stones in the gallbladder. A negative sonographic Percell Miller sign was reported by the sonographer. No pericholecystic fluid. Mild wall thickening. Common bile duct: Diameter: 6 mm Liver: Redemonstration of mass within the right hepatic lobe, predominantly hyperechoic. This is better characterized on recent MRI. Nodular liver contour. Portal vein is patent on color Doppler imaging with normal direction of blood flow towards the liver. Other: None. IMPRESSION: 1. Cholelithiasis and mild gallbladder wall thickening. No other evidence of acute cholecystitis. 2. Hepatic cirrhosis and right hepatic lobe mass, better characterized on recent abdominal MRI. Electronically Signed   By: Ulyses Jarred M.D.   On: 04/24/2020 02:54        Scheduled Meds: . docusate sodium  200 mg Oral BID  . insulin aspart  0-9 Units Subcutaneous Q4H   Continuous Infusions: . dextrose 5 % and 0.9% NaCl 100 mL/hr at 04/24/20 1356  . piperacillin-tazobactam (ZOSYN)  IV 3.375 g (04/25/20 2023)     LOS: 1 day    Time spent: 33 mins     Wyvonnia Dusky, MD [facility] Pager [phone]  [phone]  If 7PM-7AM, please contact night-coverage 04/25/2020, 7:17 AM

## 2020-04-25 NOTE — Transfer of Care (Signed)
Immediate Anesthesia Transfer of Care Note  Patient: Timothy Murray  Procedure(s) Performed: ENDOSCOPIC RETROGRADE CHOLANGIOPANCREATOGRAPHY (ERCP) (N/A )  Patient Location: PACU  Anesthesia Type:General  Level of Consciousness: drowsy  Airway & Oxygen Therapy: Patient Spontanous Breathing and Patient connected to face mask oxygen  Post-op Assessment: Report given to RN and Post -op Vital signs reviewed and stable  Post vital signs: Reviewed and stable  Last Vitals:  Vitals Value Taken Time  BP 138/54 04/25/20 1607  Temp 36.3 C 04/25/20 1607  Pulse 78 04/25/20 1613  Resp 20 04/25/20 1613  SpO2 100 % 04/25/20 1613  Vitals shown include unvalidated device data.  Last Pain:  Vitals:   04/25/20 1607  TempSrc:   PainSc: Asleep         Complications: No complications documented.

## 2020-04-25 NOTE — Anesthesia Procedure Notes (Signed)
Procedure Name: Intubation Date/Time: 04/25/2020 3:13 PM Performed by: Lily Peer, Ruth Kovich, CRNA Pre-anesthesia Checklist: Patient identified, Emergency Drugs available, Suction available and Patient being monitored Patient Re-evaluated:Patient Re-evaluated prior to induction Oxygen Delivery Method: Circle system utilized Preoxygenation: Pre-oxygenation with 100% oxygen Induction Type: IV induction Ventilation: Mask ventilation without difficulty Laryngoscope Size: McGraph and 4 Grade View: Grade I Tube type: Oral Tube size: 7.5 mm Number of attempts: 1 Airway Equipment and Method: Stylet Placement Confirmation: ETT inserted through vocal cords under direct vision,  positive ETCO2 and breath sounds checked- equal and bilateral Secured at: 22 cm Tube secured with: Tape Dental Injury: Teeth and Oropharynx as per pre-operative assessment

## 2020-04-25 NOTE — Consult Note (Signed)
Hematology/Oncology Consult note [facility] [phone] [phone]  Patient Care Team: Danelle Berry, NP as PCP - General (Nurse Practitioner) Dionisio David, MD (Cardiology) Lequita Asal, MD as Referring Physician (Hematology and Oncology)   Name of the patient: Timothy Murray  [id number]  09-25-1945    Reason for management of thrombocytopenia in the setting of cirrhosis prior to ERCP  Referring physician-Dr. Link Snuffer  Date of visit: 04/26/2020    History of presenting illness-patient is a 75 year old male with a history of cirrhosis and hepatocellular carcinoma s/p Proton beam radiation in June 2021.  He also has pancytopenia and recently underwent bone marrow biopsy with Dr. Mike Gip which did not reveal any evidence of primary myeloproliferative disorder.  He is known to have chronic thrombocytopenia which has been attributed to ITP in the past he has not required any treatment for thrombocytopenia so far.  His platelet counts have been steadily drifting down.  They were 49 in December 2021 and during his present hospitalization they were noted to be 25.  Patient is currently admitted for pancreatitis possibly secondary to gallstones and he has ERCP planned by Dr. Allen Norris.  Hematology consulted for management of thrombocytopenia prior to ERCP  ECOG PS- 1  Pain scale- 0   Review of systems- Review of Systems  Constitutional: Positive for malaise/fatigue.  Gastrointestinal: Positive for abdominal pain.    Allergies  Allergen Reactions  . Other Nausea And Vomiting  . Soap & Cleansers     "liquid soap" antibacterial -rash  . Latex Rash and Itching    Patient Active Problem List   Diagnosis Date Noted  . Pancytopenia ([place]) 04/24/2020  . Acute pancreatitis 04/24/2020  . Cirrhosis of liver on imaging, likely post proton-beam radiation ([place]) 04/24/2020  . Calculus of bile duct without cholecystitis and without  obstruction 04/16/2020  . Hepatocellular carcinoma ([place]) 12/26/2019  . Thrombocytopenia ([place]) 12/16/2016  . HTN (hypertension) 10/29/2012  . Diabetes ([place]) 10/29/2012  . Other and unspecified hyperlipidemia 10/29/2012  . CAD (coronary artery disease), native coronary artery 10/29/2012  . S/P CABG x 3 10/29/2012     Past Medical History:  Diagnosis Date  . Arthritis    "lower back" (10/24/2012)  . Coronary artery disease   . GERD (gastroesophageal reflux disease)   . High cholesterol   . Hypertension   . Liver cancer ([place])   . Type II diabetes mellitus ([place])      Past Surgical History:  Procedure Laterality Date  . APPENDECTOMY  2002  . CARDIAC CATHETERIZATION  10/24/2012  . CORONARY ARTERY BYPASS GRAFT N/A 10/26/2012   Procedure: CORONARY ARTERY BYPASS GRAFTING (CABG);  Surgeon: Melrose Nakayama, MD;  Location: [place];  Service: Open Heart Surgery;  Laterality: N/A;  Times 3 using left internal mammary artery and endoscopically harvested right saphenous vein    Social History   Socioeconomic History  . Marital status: Married    Spouse name: Not on file  . Number of children: Not on file  . Years of education: Not on file  . Highest education level: Not on file  Occupational History  . Not on file  Tobacco Use  . Smoking status: Former Smoker    Packs/day: 0.50    Years: 30.00    Pack years: 15.00    Types: Cigarettes    Quit date: 08/28/2001    Years since quitting: 18.6  . Smokeless tobacco: Never Used  Vaping Use  . Vaping Use: Not on  file  Substance and Sexual Activity  . Alcohol use: No    Alcohol/week: 0.0 standard drinks    Comment: 10/24/2012 "stopped drinking alcohol in 2003; used to drink ~ 9 bottles beer/wk"  . Drug use: No  . Sexual activity: Yes  Other Topics Concern  . Not on file  Social History Narrative  . Not on file   Social Determinants of Health   Financial Resource Strain: Not on file  Food Insecurity: Not on file   Transportation Needs: Not on file  Physical Activity: Not on file  Stress: Not on file  Social Connections: Not on file  Intimate Partner Violence: Not on file     Family History  Problem Relation Age of Onset  . Cancer Sister      Current Facility-Administered Medications:  .  [MAR Hold] 0.9 %  sodium chloride infusion (Manually program via Guardrails IV Fluids), , Intravenous, Once, Wyvonnia Dusky, MD .  Doug Sou Hold] 0.9 %  sodium chloride infusion (Manually program via Guardrails IV Fluids), , Intravenous, Once, Wyvonnia Dusky, MD .  Doug Sou Hold] acetaminophen (TYLENOL) tablet 650 mg, 650 mg, Oral, Q6H PRN **OR** [MAR Hold] acetaminophen (TYLENOL) suppository 650 mg, 650 mg, Rectal, Q6H PRN, Judd Gaudier V, MD .  dextrose 5 %-0.9 % sodium chloride infusion, , Intravenous, Continuous, Wyvonnia Dusky, MD, Stopped at 04/25/20 1446 .  [MAR Hold] docusate sodium (COLACE) capsule 200 mg, 200 mg, Oral, BID, Wyvonnia Dusky, MD, 200 mg at 04/24/20 2045 .  fentaNYL (SUBLIMAZE) injection 25-50 mcg, 25-50 mcg, Intravenous, Q5 min PRN, Penwarden, Amy, MD .  Doug Sou Hold] hydrALAZINE (APRESOLINE) injection 10 mg, 10 mg, Intravenous, Q8H PRN, Wyvonnia Dusky, MD .  Doug Sou Hold] HYDROmorphone (DILAUDID) injection 0.5-1 mg, 0.5-1 mg, Intravenous, Q2H PRN, Athena Masse, MD, 1 mg at 04/24/20 0853 .  indomethacin (INDOCIN) 50 MG suppository, , , ,  .  [MAR Hold] insulin aspart (novoLOG) injection 0-9 Units, 0-9 Units, Subcutaneous, Q4H, Athena Masse, MD .  lactated ringers infusion, , Intravenous, Continuous, Lucilla Lame, MD, Last Rate: 125 mL/hr at 04/25/20 1445, Restarted at 04/25/20 1507 .  meperidine (DEMEROL) injection 6.25-12.5 mg, 6.25-12.5 mg, Intravenous, Q5 min PRN, Penwarden, Amy, MD .  Doug Sou Hold] ondansetron (ZOFRAN) tablet 4 mg, 4 mg, Oral, Q6H PRN **OR** [MAR Hold] ondansetron (ZOFRAN) injection 4 mg, 4 mg, Intravenous, Q6H PRN, Athena Masse, MD .  Doug Sou Hold]  oxyCODONE (Oxy IR/ROXICODONE) immediate release tablet 10 mg, 10 mg, Oral, Q6H PRN, Wyvonnia Dusky, MD, 10 mg at 04/24/20 1705 .  oxyCODONE (Oxy IR/ROXICODONE) immediate release tablet 5 mg, 5 mg, Oral, Once PRN **OR** oxyCODONE (ROXICODONE) 5 MG/5ML solution 5 mg, 5 mg, Oral, Once PRN, Penwarden, Amy, MD .  Doug Sou Hold] piperacillin-tazobactam (ZOSYN) IVPB 3.375 g, 3.375 g, Intravenous, Q8H, Belue, Alver Sorrow, RPH, Last Rate: 12.5 mL/hr at 04/25/20 0614, 3.375 g at 04/25/20 0614 .  promethazine (PHENERGAN) injection 6.25-12.5 mg, 6.25-12.5 mg, Intravenous, Q15 min PRN, Penwarden, Amy, MD   Physical exam:  Vitals:   04/25/20 1202 04/25/20 1239 04/25/20 1431 04/25/20 1607  BP: (!) 123/54 (!) 117/51 (!) 151/60 (!) 138/54  Pulse: 74 80 88 83  Resp: 20 20 20  (!) 21  Temp: 98.9 F (37.2 C) 98.4 F (36.9 C) (!) 97.5 F (36.4 C) (!) 97.3 F (36.3 C)  TempSrc:   Temporal   SpO2: 99% 99% 99% 100%  Weight:      Height:  Physical Exam Constitutional:      General: He is not in acute distress.    Comments: Appears icteric  Cardiovascular:     Rate and Rhythm: Normal rate and regular rhythm.     Heart sounds: Normal heart sounds.  Pulmonary:     Effort: Pulmonary effort is normal.     Breath sounds: Normal breath sounds.  Abdominal:     General: Bowel sounds are normal.     Palpations: Abdomen is soft.  Skin:    General: Skin is warm and dry.  Neurological:     Mental Status: He is oriented to person, place, and time.        CMP Latest Ref Rng & Units 04/25/2020  Glucose 70 - 99 mg/dL 96  BUN 8 - 23 mg/dL 8  Creatinine 0.61 - 1.24 mg/dL 1.00  Sodium 135 - 145 mmol/L 139  Potassium 3.5 - 5.1 mmol/L 3.7  Chloride 98 - 111 mmol/L 106  CO2 22 - 32 mmol/L 26  Calcium 8.9 - 10.3 mg/dL 8.2(L)  Total Protein 6.5 - 8.1 g/dL 5.7(L)  Total Bilirubin 0.3 - 1.2 mg/dL 6.3(H)  Alkaline Phos 38 - 126 U/L 115  AST 15 - 41 U/L 79(H)  ALT 0 - 44 U/L 71(H)   CBC Latest Ref Rng & Units  04/25/2020  WBC 4.0 - 10.5 K/uL -  Hemoglobin 13.0 - 17.0 g/dL -  Hematocrit 39.0 - 52.0 % -  Platelets 150 - 400 K/uL 40(L)    @IMAGES @  CT ABDOMEN PELVIS W CONTRAST  Result Date: 04/16/2020 CLINICAL DATA:  Left upper quadrant abdominal pain. History of liver carcinoma. EXAM: CT ABDOMEN AND PELVIS WITH CONTRAST TECHNIQUE: Multidetector CT imaging of the abdomen and pelvis was performed using the standard protocol following bolus administration of intravenous contrast. CONTRAST:  153m OMNIPAQUE IOHEXOL 300 MG/ML  SOLN COMPARISON:  01/01/2020 FINDINGS: Lower chest: No acute abnormality. Hepatobiliary: Well-defined hypo attenuate mass in segment 5, 3.9 x 3.1 x 3.3 cm, smaller than on the prior CT. Liver shows morphologic changes consistent with cirrhosis with central volume loss and enlargement of the lateral segment of the left lobe and caudate lobe and mild surface nodularity. No other liver masses or lesions. There are gallstones, but no gallbladder wall thickening or adjacent inflammation. Small stone projects in the distal common bile duct near the ampulla. Common bile duct is dilated to 8.5 mm. Pancreas: Hazy inflammatory change extends along the margins of the pancreas, most evident adjacent to the pancreatic head and tail, but also tracking along the duodenum with fluid attenuation noted along the anterior pararenal fascia, greater on the right. No pancreatic mass or significant duct dilation. Homogeneous pancreatic enhancement is noted. Spleen: Prominent spleen measuring 14 cm in greatest dimension. No splenic mass or focal lesion. Adrenals/Urinary Tract: No adrenal masses. Kidneys are normal in size, orientation and position with symmetric enhancement and excretion. 1.8 cm cyst, upper pole of the left kidney. Tiny adjacent low-density lesion likely also a cyst. No other renal masses or lesions. No stones. No hydronephrosis. Ureters normal in course and in caliber. Bladder is unremarkable.  Stomach/Bowel: Normal stomach. Fluid attenuation/inflammatory changes track along the duodenum. Two small stones project in the second portion of the duodenum adjacent to the anterior L. Small bowel and colon are normal in caliber. No wall thickening. No inflammation. Vascular/Lymphatic: Aortic atherosclerosis. No evidence of thrombosis of the portal vein, splenic vein or superior mesenteric vein. No enlarged lymph nodes. Reproductive: Mild prostate enlargement transverse,  dimension. 5.2 cm in greatest Other: None. Musculoskeletal: No fracture or acute finding.  No bone lesion. IMPRESSION: 1. Acute uncomplicated pancreatitis. Specifically, no evidence of pancreatic necrosis or of an abscess/pseudocyst. No venous thrombosis. Etiology appears due to passage of a gallstone. 2. Choledocholithiasis. Small gallstone projects in the distal common bile duct near the ampulla Vater with two other small stones noted in the adjacent second portion of the duodenum. Mild dilation of the common bile duct. There are several stones within the gallbladder. 3. 3.9 cm hypoattenuating, rim enhancing liver mass, smaller than on the prior CT, presumably the residua of treated liver carcinoma. 4. Cirrhosis, appears stable from the prior CT. 5. Aortic atherosclerosis. Electronically Signed   By: Lajean Manes M.D.   On: 04/16/2020 16:43   MR 3D Recon At Scanner  Result Date: 04/24/2020 CLINICAL DATA:  Pancreatitis.  History of hepatocellular carcinoma. EXAM: MRI ABDOMEN WITHOUT AND WITH CONTRAST (INCLUDING MRCP) TECHNIQUE: Multiplanar multisequence MR imaging of the abdomen was performed both before and after the administration of intravenous contrast. Heavily T2-weighted images of the biliary and pancreatic ducts were obtained, and three-dimensional MRCP images were rendered by post processing. CONTRAST:  26m GADAVIST GADOBUTROL 1 MMOL/ML IV SOLN COMPARISON:  04/17/2020. FINDINGS: Lower chest: No acute findings. Hepatobiliary:  Morphologic features of cirrhosis are again noted involving the liver. The centrally necrotic mass in segment 5 of the liver with 7 mm of enhancing marginal thickness is again noted, image 29/17. On today's study this measures 4.5 by 3.5 cm, image 29/17. Previously 4.3 x 3.6 cm. High T1 signal within the gallbladder is again noted. On the MRCP images in the CBD measures 0.9 cm, image 6/14. This is compared with 5 mm on 04/17/2020. High T1 signal and low T2 signal sludge/debris (and possibly small stones) fill the distal common bile duct, image 14/3. Pancreas: There is diffuse pancreatic edema and peripancreatic fluid compatible with acute pancreatitis. This is increased when compared with the previous exam. Interval increase in caliber of the main pancreatic duct which measures 4 mm, image 22/8. Previously 2 mm. No discrete mature fluid collections identified to suggest pseudocyst. No signs of pancreatic necrosis. Spleen: Spleen has a normal size measuring 10 cm in length. No focal splenic abnormality. Adrenals/Urinary Tract: Normal appearance of the adrenal glands. No hydronephrosis identified bilaterally. Upper pole left kidney cyst measures 1.8 cm. Stomach/Bowel: Visualized portions within the abdomen are unremarkable. Vascular/Lymphatic: Aortic atherosclerosis. No aneurysm portal vein and splenic vein remain patent. No abdominal adenopathy. Other: Trace free fluid along the pericolic gutters. No discrete, drainable fluid collection identified. Musculoskeletal: No suspicious bone lesions identified. IMPRESSION: 1. No significant change in size of centrally necrotic mass in segment 5 of the liver compatible with known hepatocellular carcinoma. Currently measures 4.5 by 3.5 cm. Previously measured 4.3 x 3.6 cm. 2. Interval progression acute pancreatitis with increased edema and surrounding fluid. No pseudo cyst or signs of pancreatic necrosis. 3. Increase in caliber of the CBD and main pancreatic duct. The distal  common bile duct is filled with high T1, low T2 debris/sludge and possibly small stones, new from previous exam. 4. Morphologic features of cirrhosis. 5. Trace free fluid along the pericolic gutters. Electronically Signed   By: TKerby MoorsM.D.   On: 04/24/2020 08:21   MR 3D Recon At Scanner  Result Date: 04/17/2020 CLINICAL DATA:  Prior gallstone pancreatitis, hepatocellular carcinoma. Abdominal pain. Choledocholithiasis on CT scan yesterday. EXAM: MRI ABDOMEN WITHOUT AND WITH CONTRAST (INCLUDING MRCP) TECHNIQUE: Multiplanar multisequence  MR imaging of the abdomen was performed both before and after the administration of intravenous contrast. Heavily T2-weighted images of the biliary and pancreatic ducts were obtained, and three-dimensional MRCP images were rendered by post processing. CONTRAST:  41m GADAVIST GADOBUTROL 1 MMOL/ML IV SOLN COMPARISON:  CT abdomen 04/16/2020 FINDINGS: Lower chest: Median sternotomy. Hepatobiliary: Hepatic cirrhosis. 4.3 by 2.9 by 3.6 cm mass in segment 5 of the liver, mostly with central necrosis but with enhancing marginal thickness of up to about 0.7 cm and with some speckled potentially enhancing internal nodular elements. High T1 signal in the gallbladder. Suspected small gallstones in the gallbladder. On the 3D MRCP images, the common bile duct measures 0.5 cm in diameter (previously about 0.8 cm on yesterday CT scan) at although the dedicated MRCP images such image 33 of series 12 do not show filling defects, there is some questionable granularity in the distal CBD region for example on image 11 of series 14 which could conceivably indicate tiny residual calculi. However, these do not appear to be as prominent as the string of calculi shown on 04/16/2020 CT scan. Pancreas: Unremarkable where included. 0.4 cm cystic lesion in the junction of the pancreatic body and tail on image 14 series 14. No findings of pancreatic abscess, necrosis, or pseudocyst. Subtle peripancreatic  edema. Spleen:  Unremarkable Adrenals/Urinary Tract: Simple appearing left kidney upper pole cyst. Adrenal glands unremarkable. Stomach/Bowel: Unremarkable Vascular/Lymphatic:  Aortoiliac atherosclerotic vascular disease. Other:  Trace fluid in the right paracolic gutter. Musculoskeletal: Unremarkable IMPRESSION: 1. Low-grade peripancreatic edema may be a manifestation of prior pancreatitis. 2. Most if not all of the prior distal CBD stones appear to of cleared. On the MRCP images the distal CBD appears normal and there is no more extrahepatic biliary dilatation, although on some of the other coronal sequences there is a subtle granularity in the distal CBD which could represent residual tiny stone(s) but substantially smaller and less prominent than on recent CT. 3. Small gallstones in the gallbladder. 4. Centrally necrotic mass in segment 5 of the liver. 5. Hepatic cirrhosis. 6.  Aortic Atherosclerosis (ICD10-I70.0). 7. Trace fluid in the right paracolic gutter. Electronically Signed   By: WVan ClinesM.D.   On: 04/17/2020 13:00   CT BONE MARROW BIOPSY & ASPIRATION  Result Date: 04/22/2020 CLINICAL DATA:  Thrombocytopenia. Possible ITP. History of hepatocellular carcinoma. EXAM: CT GUIDED BONE MARROW ASPIRATION AND BIOPSY ANESTHESIA/SEDATION: Versed 1.0 mg IV, Fentanyl 50 mcg IV Total Moderate Sedation Time:   15 minutes. The patient's level of consciousness and physiologic status were continuously monitored during the procedure by Radiology nursing. PROCEDURE: The procedure risks, benefits, and alternatives were explained to the patient. Questions regarding the procedure were encouraged and answered. The patient understands and consents to the procedure. A time out was performed prior to initiating the procedure. The right gluteal region was prepped with chlorhexidine. Sterile gown and sterile gloves were used for the procedure. Local anesthesia was provided with 1% Lidocaine. Under CT guidance, an 11  gauge On Control bone cutting needle was advanced from a posterior approach into the right iliac bone. Needle positioning was confirmed with CT. Initial non heparinized and heparinized aspirate samples were obtained of bone marrow. Core biopsy was performed via the On Control drill needle. COMPLICATIONS: None FINDINGS: Inspection of initial aspirate did reveal visible particles. Intact core biopsy sample was obtained. IMPRESSION: CT guided bone marrow biopsy of right posterior iliac bone with both aspirate and core samples obtained. Electronically Signed   By: GEulas Post  Kathlene Cote M.D.   On: 04/22/2020 11:26   DG C-Arm 1-60 Min-No Report  Result Date: 04/25/2020 Fluoroscopy was utilized by the requesting physician.  No radiographic interpretation.   MR ABDOMEN MRCP W WO CONTAST  Result Date: 04/24/2020 CLINICAL DATA:  Pancreatitis.  History of hepatocellular carcinoma. EXAM: MRI ABDOMEN WITHOUT AND WITH CONTRAST (INCLUDING MRCP) TECHNIQUE: Multiplanar multisequence MR imaging of the abdomen was performed both before and after the administration of intravenous contrast. Heavily T2-weighted images of the biliary and pancreatic ducts were obtained, and three-dimensional MRCP images were rendered by post processing. CONTRAST:  29m GADAVIST GADOBUTROL 1 MMOL/ML IV SOLN COMPARISON:  04/17/2020. FINDINGS: Lower chest: No acute findings. Hepatobiliary: Morphologic features of cirrhosis are again noted involving the liver. The centrally necrotic mass in segment 5 of the liver with 7 mm of enhancing marginal thickness is again noted, image 29/17. On today's study this measures 4.5 by 3.5 cm, image 29/17. Previously 4.3 x 3.6 cm. High T1 signal within the gallbladder is again noted. On the MRCP images in the CBD measures 0.9 cm, image 6/14. This is compared with 5 mm on 04/17/2020. High T1 signal and low T2 signal sludge/debris (and possibly small stones) fill the distal common bile duct, image 14/3. Pancreas: There is  diffuse pancreatic edema and peripancreatic fluid compatible with acute pancreatitis. This is increased when compared with the previous exam. Interval increase in caliber of the main pancreatic duct which measures 4 mm, image 22/8. Previously 2 mm. No discrete mature fluid collections identified to suggest pseudocyst. No signs of pancreatic necrosis. Spleen: Spleen has a normal size measuring 10 cm in length. No focal splenic abnormality. Adrenals/Urinary Tract: Normal appearance of the adrenal glands. No hydronephrosis identified bilaterally. Upper pole left kidney cyst measures 1.8 cm. Stomach/Bowel: Visualized portions within the abdomen are unremarkable. Vascular/Lymphatic: Aortic atherosclerosis. No aneurysm portal vein and splenic vein remain patent. No abdominal adenopathy. Other: Trace free fluid along the pericolic gutters. No discrete, drainable fluid collection identified. Musculoskeletal: No suspicious bone lesions identified. IMPRESSION: 1. No significant change in size of centrally necrotic mass in segment 5 of the liver compatible with known hepatocellular carcinoma. Currently measures 4.5 by 3.5 cm. Previously measured 4.3 x 3.6 cm. 2. Interval progression acute pancreatitis with increased edema and surrounding fluid. No pseudo cyst or signs of pancreatic necrosis. 3. Increase in caliber of the CBD and main pancreatic duct. The distal common bile duct is filled with high T1, low T2 debris/sludge and possibly small stones, new from previous exam. 4. Morphologic features of cirrhosis. 5. Trace free fluid along the pericolic gutters. Electronically Signed   By: TKerby MoorsM.D.   On: 04/24/2020 08:21   MR ABDOMEN MRCP W WO CONTAST  Result Date: 04/17/2020 CLINICAL DATA:  Prior gallstone pancreatitis, hepatocellular carcinoma. Abdominal pain. Choledocholithiasis on CT scan yesterday. EXAM: MRI ABDOMEN WITHOUT AND WITH CONTRAST (INCLUDING MRCP) TECHNIQUE: Multiplanar multisequence MR imaging of the  abdomen was performed both before and after the administration of intravenous contrast. Heavily T2-weighted images of the biliary and pancreatic ducts were obtained, and three-dimensional MRCP images were rendered by post processing. CONTRAST:  985mGADAVIST GADOBUTROL 1 MMOL/ML IV SOLN COMPARISON:  CT abdomen 04/16/2020 FINDINGS: Lower chest: Median sternotomy. Hepatobiliary: Hepatic cirrhosis. 4.3 by 2.9 by 3.6 cm mass in segment 5 of the liver, mostly with central necrosis but with enhancing marginal thickness of up to about 0.7 cm and with some speckled potentially enhancing internal nodular elements. High T1 signal in the gallbladder.  Suspected small gallstones in the gallbladder. On the 3D MRCP images, the common bile duct measures 0.5 cm in diameter (previously about 0.8 cm on yesterday CT scan) at although the dedicated MRCP images such image 33 of series 12 do not show filling defects, there is some questionable granularity in the distal CBD region for example on image 11 of series 14 which could conceivably indicate tiny residual calculi. However, these do not appear to be as prominent as the string of calculi shown on 04/16/2020 CT scan. Pancreas: Unremarkable where included. 0.4 cm cystic lesion in the junction of the pancreatic body and tail on image 14 series 14. No findings of pancreatic abscess, necrosis, or pseudocyst. Subtle peripancreatic edema. Spleen:  Unremarkable Adrenals/Urinary Tract: Simple appearing left kidney upper pole cyst. Adrenal glands unremarkable. Stomach/Bowel: Unremarkable Vascular/Lymphatic:  Aortoiliac atherosclerotic vascular disease. Other:  Trace fluid in the right paracolic gutter. Musculoskeletal: Unremarkable IMPRESSION: 1. Low-grade peripancreatic edema may be a manifestation of prior pancreatitis. 2. Most if not all of the prior distal CBD stones appear to of cleared. On the MRCP images the distal CBD appears normal and there is no more extrahepatic biliary dilatation,  although on some of the other coronal sequences there is a subtle granularity in the distal CBD which could represent residual tiny stone(s) but substantially smaller and less prominent than on recent CT. 3. Small gallstones in the gallbladder. 4. Centrally necrotic mass in segment 5 of the liver. 5. Hepatic cirrhosis. 6.  Aortic Atherosclerosis (ICD10-I70.0). 7. Trace fluid in the right paracolic gutter. Electronically Signed   By: Van Clines M.D.   On: 04/17/2020 13:00   US ABDOMEN LIMITED RUQ (LIVER/GB)  Result Date: 04/24/2020 CLINICAL DATA:  Epigastric pain EXAM: ULTRASOUND ABDOMEN LIMITED RIGHT UPPER QUADRANT COMPARISON:  MRI abdomen 04/17/2020 FINDINGS: Gallbladder: There are sludge and stones in the gallbladder. A negative sonographic Percell Miller sign was reported by the sonographer. No pericholecystic fluid. Mild wall thickening. Common bile duct: Diameter: 6 mm Liver: Redemonstration of mass within the right hepatic lobe, predominantly hyperechoic. This is better characterized on recent MRI. Nodular liver contour. Portal vein is patent on color Doppler imaging with normal direction of blood flow towards the liver. Other: None. IMPRESSION: 1. Cholelithiasis and mild gallbladder wall thickening. No other evidence of acute cholecystitis. 2. Hepatic cirrhosis and right hepatic lobe mass, better characterized on recent abdominal MRI. Electronically Signed   By: Ulyses Jarred M.D.   On: 04/24/2020 02:54    Assessment and plan- Patient is a 75 y.o. male with history of pancytopenia possibly secondary to cirrhosis, hepatocellular carcinoma currently admitted forPossible episode of gallstone pancreatitis.  Hematology consulted for thrombocytopenia management prior to procedure  Pancytopenia: I have reviewed his chart and prior notes by Dr. Mike Gip. Recent bone marrow biopsy did not reveal any primary bone marrow disorder. Pancytopenia likely due to cirrhosis.  Patient ideally needs avotrombopag  electively for 5 days prior to procedure but we dont have time for that. Recommend 1 unit of platelets to start around 12 noon and check post plateelt coutn right after the transfusion. Hang second bag of paltelets during procedure.  Dr. Allen Norris plans to do ercp and stenting but hold off on sphincterotomy today given bleeding risk. Patient will likely need a second procedure electively when he can receive avotrombopag if need be  Hepatocellular carcinoma- recent MRI showed liver mass is stable. I will f/u with patient as an outpatient upon discharge as DR. Mike Gip is no longer practicing at [facility]. We  will contact him with appointments   Thank you for this kind referral and the opportunity to participate in the care of this patient   Visit Diagnosis 1. Acute gallstone pancreatitis   2. Epigastric abdominal pain   3. Pancreatitis     Dr. Randa Evens, MD, MPH [facility] at [facility] [phone] 04/25/2020 7:40 AM

## 2020-04-25 NOTE — Anesthesia Preprocedure Evaluation (Signed)
Anesthesia Evaluation  Patient identified by MRN, date of birth, ID band Patient awake    Reviewed: Allergy & Precautions, NPO status , Patient's Chart, lab work & pertinent test results  History of Anesthesia Complications Negative for: history of anesthetic complications  Airway Mallampati: III  TM Distance: >3 FB Neck ROM: Full    Dental  (+) Poor Dentition, Missing   Pulmonary neg sleep apnea, neg COPD, former smoker,    breath sounds clear to auscultation- rhonchi (-) wheezing      Cardiovascular hypertension, Pt. on medications + CAD, + CABG and + Peripheral Vascular Disease  (-) Cardiac Stents  Rhythm:Regular Rate:Normal - Systolic murmurs and - Diastolic murmurs    Neuro/Psych neg Seizures negative neurological ROS  negative psych ROS   GI/Hepatic GERD  ,Liver cancer   Endo/Other  diabetes, Oral Hypoglycemic Agents  Renal/GU negative Renal ROS     Musculoskeletal  (+) Arthritis ,   Abdominal (+) - obese,   Peds  Hematology  (+) anemia ,   Anesthesia Other Findings Past Medical History: No date: Arthritis     Comment:  "lower back" (10/24/2012) No date: Coronary artery disease No date: GERD (gastroesophageal reflux disease) No date: High cholesterol No date: Hypertension No date: Liver cancer (HCC) No date: Type II diabetes mellitus (HCC)   Reproductive/Obstetrics                             Anesthesia Physical Anesthesia Plan  ASA: III  Anesthesia Plan: General   Post-op Pain Management:    Induction: Intravenous  PONV Risk Score and Plan: 1 and Ondansetron  Airway Management Planned: Oral ETT  Additional Equipment:   Intra-op Plan:   Post-operative Plan: Extubation in OR  Informed Consent: I have reviewed the patients History and Physical, chart, labs and discussed the procedure including the risks, benefits and alternatives for the proposed anesthesia with  the patient or authorized representative who has indicated his/her understanding and acceptance.     Dental advisory given  Plan Discussed with: CRNA and Anesthesiologist  Anesthesia Plan Comments:         Anesthesia Quick Evaluation

## 2020-04-25 NOTE — Op Note (Signed)
[facility] Patient Name: Timothy Murray Procedure Date: 04/25/2020 12:52 PM MRN: [id number] Account #: [id number] Date of Birth: [birth year] Admit Type: Inpatient Age: 75 Room: [facility] ENDO ROOM 4 Gender: Male Note Status: Finalized Procedure:             ERCP Indications:           Common bile duct stone(s) Providers:             Lucilla Lame MD, MD Referring MD:          Evern Bio, NP Medicines:             General Anesthesia Complications:         No immediate complications. Procedure:             Pre-Anesthesia Assessment:                        - Prior to the procedure, a History and Physical was                         performed, and patient medications and allergies were                         reviewed. The patient's tolerance of previous                         anesthesia was also reviewed. The risks and benefits                         of the procedure and the sedation options and risks                         were discussed with the patient. All questions were                         answered, and informed consent was obtained. Prior                         Anticoagulants: The patient has taken no previous                         anticoagulant or antiplatelet agents. ASA Grade                         Assessment: III - A patient with severe systemic                         disease. After reviewing the risks and benefits, the                         patient was deemed in satisfactory condition to                         undergo the procedure.                        After obtaining informed consent, the scope was passed  under direct vision. Throughout the procedure, the                         patient's blood pressure, pulse, and oxygen                         saturations were monitored continuously. The [employer] D single use duodenoscope was                          introduced through the mouth, and used to inject                         contrast into and used to inject contrast into the                         bile duct. The ERCP was accomplished without                         difficulty. The patient tolerated the procedure well. Findings:      The scout film was normal. The esophagus was successfully intubated       under direct vision. The scope was advanced to a normal major papilla in       the descending duodenum without detailed examination of the pharynx,       larynx and associated structures, and upper GI tract. The upper GI tract       was grossly normal. The bile duct was deeply cannulated with the       short-nosed traction sphincterotome. Contrast was injected. I personally       interpreted the bile duct images. There was brisk flow of contrast       through the ducts. Image quality was excellent. Contrast extended to the       entire biliary tree. The lower third of the main bile duct contained       filling defect(s) thought to be sludge. A wire was passed into the       biliary tree. A 3 mm biliary sphincterotomy was made with a traction       (standard) sphincterotome using ERBE electrocautery. There was no       post-sphincterotomy bleeding. The biliary tree was swept with a 15 mm       balloon starting at the bifurcation. Sludge was swept from the duct. Impression:            - The examination was suspicious for sludge.                        - A biliary sphincterotomy was performed.                        - The biliary tree was swept and sludge was found. Recommendation:        - Return patient to hospital ward for ongoing care.                        - Clear liquid diet.                        -  Watch for pancreatitis, bleeding, perforation, and                         cholangitis. Procedure Code(s):     --- Professional ---                        [phone], Endoscopic retrograde cholangiopancreatography                          (ERCP); with removal of calculi/debris from                         biliary/pancreatic duct(s)                        43262, Endoscopic retrograde cholangiopancreatography                         (ERCP); with sphincterotomy/papillotomy                        [id number], Endoscopic catheterization of the biliary                         ductal system, radiological supervision and                         interpretation Diagnosis Code(s):     --- Professional ---                        K80.50, Calculus of bile duct without cholangitis or                         cholecystitis without obstruction CPT copyright 2019 [employer]. All rights reserved. The codes documented in this report are preliminary and upon coder review may  be revised to meet current compliance requirements. Lucilla Lame MD, MD 04/25/2020 4:00:07 PM This report has been signed electronically. Number of Addenda: 0 Note Initiated On: 04/25/2020 12:52 PM Estimated Blood Loss:  Estimated blood loss: none.      [facility]

## 2020-04-26 DIAGNOSIS — D61818 Other pancytopenia: Secondary | ICD-10-CM | POA: Diagnosis not present

## 2020-04-26 DIAGNOSIS — C220 Liver cell carcinoma: Secondary | ICD-10-CM | POA: Diagnosis not present

## 2020-04-26 DIAGNOSIS — K851 Biliary acute pancreatitis without necrosis or infection: Secondary | ICD-10-CM

## 2020-04-26 DIAGNOSIS — D649 Anemia, unspecified: Secondary | ICD-10-CM

## 2020-04-26 LAB — COMPREHENSIVE METABOLIC PANEL
ALT: 64 U/L — ABNORMAL HIGH (ref 0–44)
AST: 52 U/L — ABNORMAL HIGH (ref 15–41)
Albumin: 3.3 g/dL — ABNORMAL LOW (ref 3.5–5.0)
Alkaline Phosphatase: 110 U/L (ref 38–126)
Anion gap: 11 (ref 5–15)
BUN: 13 mg/dL (ref 8–23)
CO2: 24 mmol/L (ref 22–32)
Calcium: 8.3 mg/dL — ABNORMAL LOW (ref 8.9–10.3)
Chloride: 104 mmol/L (ref 98–111)
Creatinine, Ser: 1.1 mg/dL (ref 0.61–1.24)
GFR, Estimated: >60 mL/min (ref >60)
Glucose, Bld: 288 mg/dL — ABNORMAL HIGH (ref 70–99)
Potassium: 4 mmol/L (ref 3.5–5.1)
Sodium: 139 mmol/L (ref 135–145)
Total Bilirubin: 5 mg/dL — ABNORMAL HIGH (ref 0.3–1.2)
Total Protein: 6 g/dL — ABNORMAL LOW (ref 6.5–8.1)

## 2020-04-26 LAB — CBC
HCT: 21.1 % — ABNORMAL LOW (ref 39.0–52.0)
Hemoglobin: 7.5 g/dL — ABNORMAL LOW (ref 13.0–17.0)
MCH: 34.7 pg — ABNORMAL HIGH (ref 26.0–34.0)
MCHC: 35.5 g/dL (ref 30.0–36.0)
MCV: 97.7 fL (ref 80.0–100.0)
Platelets: 39 10*3/uL — ABNORMAL LOW (ref 150–400)
RBC: 2.16 MIL/uL — ABNORMAL LOW (ref 4.22–5.81)
RDW: 16.1 % — ABNORMAL HIGH (ref 11.5–15.5)
WBC: 1.7 10*3/uL — ABNORMAL LOW (ref 4.0–10.5)
nRBC: 0 % (ref 0.0–0.2)

## 2020-04-26 LAB — BPAM PLATELET PHERESIS
Blood Product Expiration Date: 202204232359
Blood Product Expiration Date: 202204252359
ISSUE DATE / TIME: 202204221131
ISSUE DATE / TIME: 202204221511
Unit Type and Rh: 6200
Unit Type and Rh: 5100

## 2020-04-26 LAB — PREPARE PLATELET PHERESIS
Unit division: 0
Unit division: 0

## 2020-04-26 LAB — GLUCOSE, CAPILLARY
Glucose-Capillary: 144 mg/dL — ABNORMAL HIGH (ref 70–99)
Glucose-Capillary: 214 mg/dL — ABNORMAL HIGH (ref 70–99)
Glucose-Capillary: 256 mg/dL — ABNORMAL HIGH (ref 70–99)
Glucose-Capillary: 256 mg/dL — ABNORMAL HIGH (ref 70–99)

## 2020-04-26 LAB — LIPASE, BLOOD: Lipase: 48 U/L (ref 11–51)

## 2020-04-26 MED ORDER — OXYCODONE HCL 10 MG PO TABS: 10.0000 mg | ORAL_TABLET | ORAL | 0 refills | Status: AC | PRN

## 2020-04-26 NOTE — Discharge Summary (Signed)
Physician Discharge Summary  Timothy Murray XBD:[id number] DOB: 20-Feb-1945 DOA: 04/24/2020  PCP: Danelle Berry, NP  Admit date: 04/24/2020 Discharge date: 04/26/2020  Admitted From: home  Disposition: home   Recommendations for Outpatient Follow-up:  1. Follow up with PCP in 1-2 weeks 2. F/u w/ onco in 1-2 weeks  Home Health:  Equipment/Devices:   Discharge Condition: stable  CODE STATUS: full  Diet recommendation: Heart Healthy / Carb Modified   Brief/Interim Summary: HPI was taken from Dr. Damita Dunnings: Timothy Murray is a 75 y.o. male with medical history significant for hepatocellular carcinoma 03/2019 s/p proton beam radiation x25 sessions, completed 06/2019, HTN, diabetes, pancytopenia s/p bone marrow biopsy on 4/19, CAD status post CABG x3, cholelithiasis, not a candidate for cholecystectomy as the liver mass is abutting the gallbladder, and hospitalized a week ago for acute biliary pancreatitis with lipase in the 6000s, and bilirubin of 7, discharged within 24 hours due to rapid resolution confirmed by MRI, who presents with recurrence of abdominal pain, similar to recent episode.  The pain started after a fish dinner-, severe and located in the left epigastric region radiating to the back.  Associated with nausea but no vomiting.  He has no fever or chills, no chest pain or shortness of breath. ED Course: On arrival, afebrile, BP 143/57,, respirations 16 with O2 sat 100% on room air.  Blood work significant for AST/ALT of 92/63, alk phos 123, bilirubin 4, down from 6.8 at discharge a week prior.  Lipase 1132, up from 690 at discharge on 4/13 but down from a high of 6185 on admission on 4/13.  He has continued pancytopenia, unchanged with WBC 2600, hemoglobin 8.2 and platelets 32,000.  Troponin normal at 5. EKG as reviewed by me : NSR at 74 with no acute ST-T wave changes Imaging: RUQ sonogram: Negative sonographic Murphy sign.  Cholelithiasis and mild gallbladder wall thickening. No  other evidence of acute cholecystitis. 2. Hepatic cirrhosis and right hepatic lobe mass, better characterized on recent abdominal MRI.  Hospital course from Dr. Jimmye Norman 4/21-4/23/22: Pt presented w/ recurrent pancreatitis and likely choledocholithiasis. Pt did receive IV zosyn while inpatient but pt did not have any fevers. Lipase was back WNL prior to d/c. Of note, pt was pancytopenic and pt received 2 packets of platelets on the day that the ERCP was done. ERCP showed examination that suspicious for sludge, a biliary sphincterotomy was performed & the biliary tree was swept and sludge was found. Pt was ambulating and transferring independently therefore therapy was not needed. For more information, please see previous progress/consult notes.   Discharge Diagnoses:  Principal Problem:   Acute pancreatitis Active Problems:   HTN (hypertension)   Diabetes ([place])   CAD (coronary artery disease), native coronary artery   S/P CABG x 3   Hepatocellular carcinoma (HCC)   Calculus of bile duct without cholecystitis and without obstruction   Pancytopenia (HCC)   Cirrhosis of liver on imaging, likely post proton-beam radiation ([place])   Other pancytopenia ([place])   Acute gallstone pancreatitis  Recurrent pancreatitis: w/ possible choledocholithiasis. Continue on IV zosyn while inpatient. Lipase is WNL. Repeat MRCP shows interval progression acute pancreatitis w/ increased edema & surrounding fluid. No pseudo cysts or signs of pancreatic necrosis. Increase in caliber of the CBD and main pancreatic duct. The distal common bile duct is filled with high T1, low T2 debris/sludge and possibly small stones, new from previous exam. No significant change in size of centrally necrotic mass in segment 5 of the  liver compatible with known hepatocellular carcinoma. S/p ERCP which showed the examination that was suspicious for sludge, a biliary sphincterotomy was performed & the biliary tree was swept and sludge was found.    Hepatocellular carcinoma: dx March 2021. S/p proton beam radiation x 25 sessions & completed 06/2019. Onco consulted   HTN: IV hydralazine prn. Will continue to hold all anti-HTN po meds while pt is NPO   DM2: likely well controlled. Continue on SSI w/ accuchecks   Hx of CAD: s/p CABG in 2014. Hx penetrating aortic ulcer s/p TEVAR.   Pancytopenia: will receive 2 packets of platelets today as per GI request. S/p bone marrow biopsy on 04/22/20 as per Dr. Mike Gip. Results to be discussed by onco   Cirrhosis: continue w/ supportive care. Not on lactulose or rifaximin.   Transaminitis: likely secondary to hepatocellular carcinoma & cirrhosis. Continue to hold statin   Discharge Instructions  Discharge Instructions    Diet - low sodium heart healthy   Complete by: As directed    Diet Carb Modified   Complete by: As directed    Discharge instructions   Complete by: As directed    F/u w/ onco in 1-2 weeks. F/u w/ PCP in 1-2 weeks.   Increase activity slowly   Complete by: As directed      Allergies as of 04/26/2020      Reactions   Other Nausea And Vomiting   Soap & Cleansers    "liquid soap" antibacterial -rash   Latex Rash, Itching      Medication List    STOP taking these medications   aspirin EC 81 MG tablet     TAKE these medications   atorvastatin 40 MG tablet Commonly known as: LIPITOR Take 1 tablet by mouth daily.   glimepiride 1 MG tablet Commonly known as: AMARYL Take 1 mg by mouth daily.   glucosamine-chondroitin 500-400 MG tablet Take 1 tablet by mouth daily.   losartan 25 MG tablet Commonly known as: COZAAR Take 25 mg by mouth daily.   metoprolol tartrate 25 MG tablet Commonly known as: LOPRESSOR Take 25 mg by mouth daily.   omeprazole 20 MG capsule Commonly known as: PRILOSEC Take 40 mg by mouth every morning.   Oxycodone HCl 10 MG Tabs Take 1 tablet (10 mg total) by mouth every 4 (four) hours as needed for up to 7 days for moderate pain  or severe pain.       Allergies  Allergen Reactions  . Other Nausea And Vomiting  . Soap & Cleansers     "liquid soap" antibacterial -rash  . Latex Rash and Itching    Consultations:  GI, Dr. Cheral Almas, Dr. Janese Banks    Procedures/Studies: CT ABDOMEN PELVIS W CONTRAST  Result Date: 04/16/2020 CLINICAL DATA:  Left upper quadrant abdominal pain. History of liver carcinoma. EXAM: CT ABDOMEN AND PELVIS WITH CONTRAST TECHNIQUE: Multidetector CT imaging of the abdomen and pelvis was performed using the standard protocol following bolus administration of intravenous contrast. CONTRAST:  136m OMNIPAQUE IOHEXOL 300 MG/ML  SOLN COMPARISON:  01/01/2020 FINDINGS: Lower chest: No acute abnormality. Hepatobiliary: Well-defined hypo attenuate mass in segment 5, 3.9 x 3.1 x 3.3 cm, smaller than on the prior CT. Liver shows morphologic changes consistent with cirrhosis with central volume loss and enlargement of the lateral segment of the left lobe and caudate lobe and mild surface nodularity. No other liver masses or lesions. There are gallstones, but no gallbladder wall thickening or adjacent inflammation. Small  stone projects in the distal common bile duct near the ampulla. Common bile duct is dilated to 8.5 mm. Pancreas: Hazy inflammatory change extends along the margins of the pancreas, most evident adjacent to the pancreatic head and tail, but also tracking along the duodenum with fluid attenuation noted along the anterior pararenal fascia, greater on the right. No pancreatic mass or significant duct dilation. Homogeneous pancreatic enhancement is noted. Spleen: Prominent spleen measuring 14 cm in greatest dimension. No splenic mass or focal lesion. Adrenals/Urinary Tract: No adrenal masses. Kidneys are normal in size, orientation and position with symmetric enhancement and excretion. 1.8 cm cyst, upper pole of the left kidney. Tiny adjacent low-density lesion likely also a cyst. No other renal masses or  lesions. No stones. No hydronephrosis. Ureters normal in course and in caliber. Bladder is unremarkable. Stomach/Bowel: Normal stomach. Fluid attenuation/inflammatory changes track along the duodenum. Two small stones project in the second portion of the duodenum adjacent to the anterior L. Small bowel and colon are normal in caliber. No wall thickening. No inflammation. Vascular/Lymphatic: Aortic atherosclerosis. No evidence of thrombosis of the portal vein, splenic vein or superior mesenteric vein. No enlarged lymph nodes. Reproductive: Mild prostate enlargement transverse, dimension. 5.2 cm in greatest Other: None. Musculoskeletal: No fracture or acute finding.  No bone lesion. IMPRESSION: 1. Acute uncomplicated pancreatitis. Specifically, no evidence of pancreatic necrosis or of an abscess/pseudocyst. No venous thrombosis. Etiology appears due to passage of a gallstone. 2. Choledocholithiasis. Small gallstone projects in the distal common bile duct near the ampulla Vater with two other small stones noted in the adjacent second portion of the duodenum. Mild dilation of the common bile duct. There are several stones within the gallbladder. 3. 3.9 cm hypoattenuating, rim enhancing liver mass, smaller than on the prior CT, presumably the residua of treated liver carcinoma. 4. Cirrhosis, appears stable from the prior CT. 5. Aortic atherosclerosis. Electronically Signed   By: Lajean Manes M.D.   On: 04/16/2020 16:43   MR 3D Recon At Scanner  Result Date: 04/24/2020 CLINICAL DATA:  Pancreatitis.  History of hepatocellular carcinoma. EXAM: MRI ABDOMEN WITHOUT AND WITH CONTRAST (INCLUDING MRCP) TECHNIQUE: Multiplanar multisequence MR imaging of the abdomen was performed both before and after the administration of intravenous contrast. Heavily T2-weighted images of the biliary and pancreatic ducts were obtained, and three-dimensional MRCP images were rendered by post processing. CONTRAST:  53m GADAVIST GADOBUTROL 1  MMOL/ML IV SOLN COMPARISON:  04/17/2020. FINDINGS: Lower chest: No acute findings. Hepatobiliary: Morphologic features of cirrhosis are again noted involving the liver. The centrally necrotic mass in segment 5 of the liver with 7 mm of enhancing marginal thickness is again noted, image 29/17. On today's study this measures 4.5 by 3.5 cm, image 29/17. Previously 4.3 x 3.6 cm. High T1 signal within the gallbladder is again noted. On the MRCP images in the CBD measures 0.9 cm, image 6/14. This is compared with 5 mm on 04/17/2020. High T1 signal and low T2 signal sludge/debris (and possibly small stones) fill the distal common bile duct, image 14/3. Pancreas: There is diffuse pancreatic edema and peripancreatic fluid compatible with acute pancreatitis. This is increased when compared with the previous exam. Interval increase in caliber of the main pancreatic duct which measures 4 mm, image 22/8. Previously 2 mm. No discrete mature fluid collections identified to suggest pseudocyst. No signs of pancreatic necrosis. Spleen: Spleen has a normal size measuring 10 cm in length. No focal splenic abnormality. Adrenals/Urinary Tract: Normal appearance of the adrenal glands.  No hydronephrosis identified bilaterally. Upper pole left kidney cyst measures 1.8 cm. Stomach/Bowel: Visualized portions within the abdomen are unremarkable. Vascular/Lymphatic: Aortic atherosclerosis. No aneurysm portal vein and splenic vein remain patent. No abdominal adenopathy. Other: Trace free fluid along the pericolic gutters. No discrete, drainable fluid collection identified. Musculoskeletal: No suspicious bone lesions identified. IMPRESSION: 1. No significant change in size of centrally necrotic mass in segment 5 of the liver compatible with known hepatocellular carcinoma. Currently measures 4.5 by 3.5 cm. Previously measured 4.3 x 3.6 cm. 2. Interval progression acute pancreatitis with increased edema and surrounding fluid. No pseudo cyst or  signs of pancreatic necrosis. 3. Increase in caliber of the CBD and main pancreatic duct. The distal common bile duct is filled with high T1, low T2 debris/sludge and possibly small stones, new from previous exam. 4. Morphologic features of cirrhosis. 5. Trace free fluid along the pericolic gutters. Electronically Signed   By: Kerby Moors M.D.   On: 04/24/2020 08:21   MR 3D Recon At Scanner  Result Date: 04/17/2020 CLINICAL DATA:  Prior gallstone pancreatitis, hepatocellular carcinoma. Abdominal pain. Choledocholithiasis on CT scan yesterday. EXAM: MRI ABDOMEN WITHOUT AND WITH CONTRAST (INCLUDING MRCP) TECHNIQUE: Multiplanar multisequence MR imaging of the abdomen was performed both before and after the administration of intravenous contrast. Heavily T2-weighted images of the biliary and pancreatic ducts were obtained, and three-dimensional MRCP images were rendered by post processing. CONTRAST:  65m GADAVIST GADOBUTROL 1 MMOL/ML IV SOLN COMPARISON:  CT abdomen 04/16/2020 FINDINGS: Lower chest: Median sternotomy. Hepatobiliary: Hepatic cirrhosis. 4.3 by 2.9 by 3.6 cm mass in segment 5 of the liver, mostly with central necrosis but with enhancing marginal thickness of up to about 0.7 cm and with some speckled potentially enhancing internal nodular elements. High T1 signal in the gallbladder. Suspected small gallstones in the gallbladder. On the 3D MRCP images, the common bile duct measures 0.5 cm in diameter (previously about 0.8 cm on yesterday CT scan) at although the dedicated MRCP images such image 33 of series 12 do not show filling defects, there is some questionable granularity in the distal CBD region for example on image 11 of series 14 which could conceivably indicate tiny residual calculi. However, these do not appear to be as prominent as the string of calculi shown on 04/16/2020 CT scan. Pancreas: Unremarkable where included. 0.4 cm cystic lesion in the junction of the pancreatic body and tail on  image 14 series 14. No findings of pancreatic abscess, necrosis, or pseudocyst. Subtle peripancreatic edema. Spleen:  Unremarkable Adrenals/Urinary Tract: Simple appearing left kidney upper pole cyst. Adrenal glands unremarkable. Stomach/Bowel: Unremarkable Vascular/Lymphatic:  Aortoiliac atherosclerotic vascular disease. Other:  Trace fluid in the right paracolic gutter. Musculoskeletal: Unremarkable IMPRESSION: 1. Low-grade peripancreatic edema may be a manifestation of prior pancreatitis. 2. Most if not all of the prior distal CBD stones appear to of cleared. On the MRCP images the distal CBD appears normal and there is no more extrahepatic biliary dilatation, although on some of the other coronal sequences there is a subtle granularity in the distal CBD which could represent residual tiny stone(s) but substantially smaller and less prominent than on recent CT. 3. Small gallstones in the gallbladder. 4. Centrally necrotic mass in segment 5 of the liver. 5. Hepatic cirrhosis. 6.  Aortic Atherosclerosis (ICD10-I70.0). 7. Trace fluid in the right paracolic gutter. Electronically Signed   By: WVan ClinesM.D.   On: 04/17/2020 13:00   CT BONE MARROW BIOPSY & ASPIRATION  Result Date: 04/22/2020 CLINICAL  DATA:  Thrombocytopenia. Possible ITP. History of hepatocellular carcinoma. EXAM: CT GUIDED BONE MARROW ASPIRATION AND BIOPSY ANESTHESIA/SEDATION: Versed 1.0 mg IV, Fentanyl 50 mcg IV Total Moderate Sedation Time:   15 minutes. The patient's level of consciousness and physiologic status were continuously monitored during the procedure by Radiology nursing. PROCEDURE: The procedure risks, benefits, and alternatives were explained to the patient. Questions regarding the procedure were encouraged and answered. The patient understands and consents to the procedure. A time out was performed prior to initiating the procedure. The right gluteal region was prepped with chlorhexidine. Sterile gown and sterile gloves  were used for the procedure. Local anesthesia was provided with 1% Lidocaine. Under CT guidance, an 11 gauge On Control bone cutting needle was advanced from a posterior approach into the right iliac bone. Needle positioning was confirmed with CT. Initial non heparinized and heparinized aspirate samples were obtained of bone marrow. Core biopsy was performed via the On Control drill needle. COMPLICATIONS: None FINDINGS: Inspection of initial aspirate did reveal visible particles. Intact core biopsy sample was obtained. IMPRESSION: CT guided bone marrow biopsy of right posterior iliac bone with both aspirate and core samples obtained. Electronically Signed   By: Aletta Edouard M.D.   On: 04/22/2020 11:26   DG C-Arm 1-60 Min-No Report  Result Date: 04/25/2020 Fluoroscopy was utilized by the requesting physician.  No radiographic interpretation.   MR ABDOMEN MRCP W WO CONTAST  Result Date: 04/24/2020 CLINICAL DATA:  Pancreatitis.  History of hepatocellular carcinoma. EXAM: MRI ABDOMEN WITHOUT AND WITH CONTRAST (INCLUDING MRCP) TECHNIQUE: Multiplanar multisequence MR imaging of the abdomen was performed both before and after the administration of intravenous contrast. Heavily T2-weighted images of the biliary and pancreatic ducts were obtained, and three-dimensional MRCP images were rendered by post processing. CONTRAST:  58m GADAVIST GADOBUTROL 1 MMOL/ML IV SOLN COMPARISON:  04/17/2020. FINDINGS: Lower chest: No acute findings. Hepatobiliary: Morphologic features of cirrhosis are again noted involving the liver. The centrally necrotic mass in segment 5 of the liver with 7 mm of enhancing marginal thickness is again noted, image 29/17. On today's study this measures 4.5 by 3.5 cm, image 29/17. Previously 4.3 x 3.6 cm. High T1 signal within the gallbladder is again noted. On the MRCP images in the CBD measures 0.9 cm, image 6/14. This is compared with 5 mm on 04/17/2020. High T1 signal and low T2 signal  sludge/debris (and possibly small stones) fill the distal common bile duct, image 14/3. Pancreas: There is diffuse pancreatic edema and peripancreatic fluid compatible with acute pancreatitis. This is increased when compared with the previous exam. Interval increase in caliber of the main pancreatic duct which measures 4 mm, image 22/8. Previously 2 mm. No discrete mature fluid collections identified to suggest pseudocyst. No signs of pancreatic necrosis. Spleen: Spleen has a normal size measuring 10 cm in length. No focal splenic abnormality. Adrenals/Urinary Tract: Normal appearance of the adrenal glands. No hydronephrosis identified bilaterally. Upper pole left kidney cyst measures 1.8 cm. Stomach/Bowel: Visualized portions within the abdomen are unremarkable. Vascular/Lymphatic: Aortic atherosclerosis. No aneurysm portal vein and splenic vein remain patent. No abdominal adenopathy. Other: Trace free fluid along the pericolic gutters. No discrete, drainable fluid collection identified. Musculoskeletal: No suspicious bone lesions identified. IMPRESSION: 1. No significant change in size of centrally necrotic mass in segment 5 of the liver compatible with known hepatocellular carcinoma. Currently measures 4.5 by 3.5 cm. Previously measured 4.3 x 3.6 cm. 2. Interval progression acute pancreatitis with increased edema and surrounding fluid. No  pseudo cyst or signs of pancreatic necrosis. 3. Increase in caliber of the CBD and main pancreatic duct. The distal common bile duct is filled with high T1, low T2 debris/sludge and possibly small stones, new from previous exam. 4. Morphologic features of cirrhosis. 5. Trace free fluid along the pericolic gutters. Electronically Signed   By: Kerby Moors M.D.   On: 04/24/2020 08:21   MR ABDOMEN MRCP W WO CONTAST  Result Date: 04/17/2020 CLINICAL DATA:  Prior gallstone pancreatitis, hepatocellular carcinoma. Abdominal pain. Choledocholithiasis on CT scan yesterday. EXAM:  MRI ABDOMEN WITHOUT AND WITH CONTRAST (INCLUDING MRCP) TECHNIQUE: Multiplanar multisequence MR imaging of the abdomen was performed both before and after the administration of intravenous contrast. Heavily T2-weighted images of the biliary and pancreatic ducts were obtained, and three-dimensional MRCP images were rendered by post processing. CONTRAST:  80m GADAVIST GADOBUTROL 1 MMOL/ML IV SOLN COMPARISON:  CT abdomen 04/16/2020 FINDINGS: Lower chest: Median sternotomy. Hepatobiliary: Hepatic cirrhosis. 4.3 by 2.9 by 3.6 cm mass in segment 5 of the liver, mostly with central necrosis but with enhancing marginal thickness of up to about 0.7 cm and with some speckled potentially enhancing internal nodular elements. High T1 signal in the gallbladder. Suspected small gallstones in the gallbladder. On the 3D MRCP images, the common bile duct measures 0.5 cm in diameter (previously about 0.8 cm on yesterday CT scan) at although the dedicated MRCP images such image 33 of series 12 do not show filling defects, there is some questionable granularity in the distal CBD region for example on image 11 of series 14 which could conceivably indicate tiny residual calculi. However, these do not appear to be as prominent as the string of calculi shown on 04/16/2020 CT scan. Pancreas: Unremarkable where included. 0.4 cm cystic lesion in the junction of the pancreatic body and tail on image 14 series 14. No findings of pancreatic abscess, necrosis, or pseudocyst. Subtle peripancreatic edema. Spleen:  Unremarkable Adrenals/Urinary Tract: Simple appearing left kidney upper pole cyst. Adrenal glands unremarkable. Stomach/Bowel: Unremarkable Vascular/Lymphatic:  Aortoiliac atherosclerotic vascular disease. Other:  Trace fluid in the right paracolic gutter. Musculoskeletal: Unremarkable IMPRESSION: 1. Low-grade peripancreatic edema may be a manifestation of prior pancreatitis. 2. Most if not all of the prior distal CBD stones appear to of  cleared. On the MRCP images the distal CBD appears normal and there is no more extrahepatic biliary dilatation, although on some of the other coronal sequences there is a subtle granularity in the distal CBD which could represent residual tiny stone(s) but substantially smaller and less prominent than on recent CT. 3. Small gallstones in the gallbladder. 4. Centrally necrotic mass in segment 5 of the liver. 5. Hepatic cirrhosis. 6.  Aortic Atherosclerosis (ICD10-I70.0). 7. Trace fluid in the right paracolic gutter. Electronically Signed   By: WVan ClinesM.D.   On: 04/17/2020 13:00   [place] LIMITED RUQ (LIVER/GB)  Result Date: 04/24/2020 CLINICAL DATA:  Epigastric pain EXAM: ULTRASOUND ABDOMEN LIMITED RIGHT UPPER QUADRANT COMPARISON:  MRI abdomen 04/17/2020 FINDINGS: Gallbladder: There are sludge and stones in the gallbladder. A negative sonographic MPercell Millersign was reported by the sonographer. No pericholecystic fluid. Mild wall thickening. Common bile duct: Diameter: 6 mm Liver: Redemonstration of mass within the right hepatic lobe, predominantly hyperechoic. This is better characterized on recent MRI. Nodular liver contour. Portal vein is patent on color Doppler imaging with normal direction of blood flow towards the liver. Other: None. IMPRESSION: 1. Cholelithiasis and mild gallbladder wall thickening. No other evidence of acute  cholecystitis. 2. Hepatic cirrhosis and right hepatic lobe mass, better characterized on recent abdominal MRI. Electronically Signed   By: Ulyses Jarred M.D.   On: 04/24/2020 02:54       Subjective: Pt c/o intermittent abd pain    Discharge Exam: Vitals:   04/26/20 0507 04/26/20 0828  BP: 119/61 (!) 129/50  Pulse: 60 71  Resp: 16 18  Temp: 98.1 F (36.7 C) 97.7 F (36.5 C)  SpO2: 100% 100%   Vitals:   04/25/20 1958 04/26/20 0003 04/26/20 0507 04/26/20 0828  BP: 140/62 (!) 144/62 119/61 (!) 129/50  Pulse: 76 65 60 71  Resp: 15 16 16 18   Temp: 98.3 F  (36.8 C) 97.7 F (36.5 C) 98.1 F (36.7 C) 97.7 F (36.5 C)  TempSrc: Oral Oral Oral Oral  SpO2: 98% 100% 100% 100%  Weight:      Height:        General: Pt is alert, awake, not in acute distress Cardiovascular:  S1/S2 +, no rubs, no gallops Respiratory: CTA bilaterally, no wheezing, no rhonchi Abdominal: Soft, ND, bowel sounds + Extremities: no edema, no cyanosis    The results of significant diagnostics from this hospitalization (including imaging, microbiology, ancillary and laboratory) are listed below for reference.     Microbiology: Recent Results (from the past 240 hour(s))  SARS CORONAVIRUS 2 (TAT 6-24 HRS) Nasopharyngeal Nasopharyngeal Swab     Status: None   Collection Time: 04/16/20  8:20 PM   Specimen: Nasopharyngeal Swab  Result Value Ref Range Status   SARS Coronavirus 2 NEGATIVE NEGATIVE Final    Comment: (NOTE) SARS-CoV-2 target nucleic acids are NOT DETECTED.  The SARS-CoV-2 RNA is generally detectable in upper and lower respiratory specimens during the acute phase of infection. Negative results do not preclude SARS-CoV-2 infection, do not rule out co-infections with other pathogens, and should not be used as the sole basis for treatment or other patient management decisions. Negative results must be combined with clinical observations, patient history, and epidemiological information. The expected result is Negative.  Fact Sheet for Patients: SugarRoll.be  Fact Sheet for Healthcare Providers: https://www.woods-mathews.com/  This test is not yet approved or cleared by the [place] FDA and  has been authorized for detection and/or diagnosis of SARS-CoV-2 by FDA under an Emergency Use Authorization (EUA). This EUA will remain  in effect (meaning this test can be used) for the duration of the COVID-19 declaration under Se ction 564(b)(1) of the Act, 21 U.S.C. section 360bbb-3(b)(1), unless the authorization  is terminated or revoked sooner.  Performed at [facility], [place] [address], [place], [place] [address]      Labs: BNP (last 3 results) No results for input(s): BNP in the last 8760 hours. Basic Metabolic Panel: Recent Labs  Lab 04/24/20 0147 04/25/20 0447 04/26/20 0352  NA 139 139 139  K 3.5 3.7 4.0  CL 105 106 104  CO2 26 26 24   GLUCOSE 140* 96 288*  BUN 12 8 13   CREATININE 1.23 1.00 1.10  CALCIUM 8.7* 8.2* 8.3*   Liver Function Tests: Recent Labs  Lab 04/24/20 0147 04/25/20 0447 04/26/20 0352  AST 92* 79* 52*  ALT 63* 71* 64*  ALKPHOS 123 115 110  BILITOT 4.0* 6.3* 5.0*  PROT 6.8 5.7* 6.0*  ALBUMIN 3.8 3.2* 3.3*   Recent Labs  Lab 04/24/20 0147 04/25/20 0447 04/26/20 0352  LIPASE 1,132* 183* 48   Recent Labs  Lab 04/25/20 0447  AMMONIA 19   CBC:  Recent Labs  Lab 04/22/20 0900 04/24/20 0147 04/25/20 0447 04/25/20 1259 04/26/20 0352  WBC 2.1* 2.6* 2.2*  --  1.7*  NEUTROABS 1.3* 1.7  --   --   --   HGB 8.8* 8.2* 7.2*  --  7.5*  HCT 25.0* 23.6* 20.7*  --  21.1*  MCV 98.0 99.6 100.0  --  97.7  PLT 33* 32* 25* 40* 39*   Cardiac Enzymes: No results for input(s): CKTOTAL, CKMB, CKMBINDEX, TROPONINI in the last 168 hours. BNP: Invalid input(s): POCBNP CBG: Recent Labs  Lab 04/25/20 1449 04/25/20 2117 04/26/20 0000 04/26/20 0514 04/26/20 0829  GLUCAP 81 249* 256* 256* 214*   D-Dimer No results for input(s): DDIMER in the last 72 hours. Hgb A1c No results for input(s): HGBA1C in the last 72 hours. Lipid Profile No results for input(s): CHOL, HDL, LDLCALC, TRIG, CHOLHDL, LDLDIRECT in the last 72 hours. Thyroid function studies No results for input(s): TSH, T4TOTAL, T3FREE, THYROIDAB in the last 72 hours.  Invalid input(s): FREET3 Anemia work up No results for input(s): VITAMINB12, FOLATE, FERRITIN, TIBC, IRON, RETICCTPCT in the last 72 hours. Urinalysis    Component Value Date/Time   COLORURINE YELLOW (A) 04/16/2020 1415    APPEARANCEUR CLEAR (A) 04/16/2020 1415   APPEARANCEUR Clear 07/02/2011 0128   LABSPEC 1.003 (L) 04/16/2020 1415   LABSPEC 1.010 07/02/2011 0128   PHURINE 6.0 04/16/2020 1415   GLUCOSEU NEGATIVE 04/16/2020 1415   GLUCOSEU Negative 07/02/2011 0128   HGBUR SMALL (A) 04/16/2020 1415   BILIRUBINUR NEGATIVE 04/16/2020 1415   BILIRUBINUR Negative 07/02/2011 0128   KETONESUR NEGATIVE 04/16/2020 1415   PROTEINUR NEGATIVE 04/16/2020 1415   UROBILINOGEN 0.2 10/25/2012 0517   NITRITE NEGATIVE 04/16/2020 1415   LEUKOCYTESUR NEGATIVE 04/16/2020 1415   LEUKOCYTESUR Negative 07/02/2011 0128   Sepsis Labs Invalid input(s): PROCALCITONIN,  WBC,  LACTICIDVEN Microbiology Recent Results (from the past 240 hour(s))  SARS CORONAVIRUS 2 (TAT 6-24 HRS) Nasopharyngeal Nasopharyngeal Swab     Status: None   Collection Time: 04/16/20  8:20 PM   Specimen: Nasopharyngeal Swab  Result Value Ref Range Status   SARS Coronavirus 2 NEGATIVE NEGATIVE Final    Comment: (NOTE) SARS-CoV-2 target nucleic acids are NOT DETECTED.  The SARS-CoV-2 RNA is generally detectable in upper and lower respiratory specimens during the acute phase of infection. Negative results do not preclude SARS-CoV-2 infection, do not rule out co-infections with other pathogens, and should not be used as the sole basis for treatment or other patient management decisions. Negative results must be combined with clinical observations, patient history, and epidemiological information. The expected result is Negative.  Fact Sheet for Patients: SugarRoll.be  Fact Sheet for Healthcare Providers: https://www.woods-mathews.com/  This test is not yet approved or cleared by the [place] FDA and  has been authorized for detection and/or diagnosis of SARS-CoV-2 by FDA under an Emergency Use Authorization (EUA). This EUA will remain  in effect (meaning this test can be used) for the duration of  the COVID-19 declaration under Se ction 564(b)(1) of the Act, 21 U.S.C. section 360bbb-3(b)(1), unless the authorization is terminated or revoked sooner.  Performed at [facility], [place] [address], [place], [place] [address]      Time coordinating discharge: Over 30 minutes  SIGNED:   Wyvonnia Dusky, MD  [facility] 04/26/2020, 10:22 AM Pager   If 7PM-7AM, please contact night-coverage

## 2020-04-26 NOTE — Plan of Care (Signed)

## 2020-04-26 NOTE — TOC Initial Note (Signed)
Transition of Care [facility]) - Initial/Assessment Note    Patient Details  Name: Timothy Murray MRN: [id number] Date of Birth: Jan 16, 1945  [facility] CM/SW Contact:    Magnus Ivan, LCSW Phone Number: 04/26/2020, 9:43 AM  Clinical Narrative:                Per MD, plan for patient to discharge home today and patient needs information on Nutritionist. CSW spoke with patient. Patient reported he lives with his wife who will provide transportation home. PCP is Margurite Auerbach, NP. [facility]. CSW informed patient that per StartupExpense.be, he will need a PCP referral to see a Dietitian/Nutritionist. Printed list of Dietitian/Nutritionists from Medicare.gov which was placed in patient's [place] by Nurse Secretary. Informed patient of this. Patient verbalized understanding and said he plans to follow up with his PCP for a referral.   Expected Discharge Plan: Home/Self Care Barriers to Discharge: Barriers Resolved   Patient Goals and CMS Choice Patient states their goals for this hospitalization and ongoing recovery are:: to return home CMS Medicare.gov Compare Post Acute Care list provided to:: Patient Choice offered to / list presented to : Patient  Expected Discharge Plan and Services Expected Discharge Plan: Home/Self Care       Living arrangements for the past 2 months: Single Family Home                                      Prior Living Arrangements/Services Living arrangements for the past 2 months: Single Family Home Lives with:: Spouse Patient language and need for interpreter reviewed:: Yes Do you feel safe going back to the place where you live?: Yes      Need for Family Participation in Patient Care: Yes (Comment) Care giver support system in place?: Yes (comment)   Criminal Activity/Legal Involvement Pertinent to Current Situation/Hospitalization: No - Comment as needed  Activities of Daily Living Home Assistive Devices/Equipment:  None ADL Screening (condition at time of admission) Patient's cognitive ability adequate to safely complete daily activities?: Yes Is the patient deaf or have difficulty hearing?: No Does the patient have difficulty seeing, even when wearing glasses/contacts?: No Does the patient have difficulty concentrating, remembering, or making decisions?: No Patient able to express need for assistance with ADLs?: Yes Does the patient have difficulty dressing or bathing?: No Independently performs ADLs?: Yes (appropriate for developmental age) Does the patient have difficulty walking or climbing stairs?: No Weakness of Legs: None Weakness of Arms/Hands: None  Permission Sought/Granted Permission sought to share information with : Family Supports Permission granted to share information with : Yes, Verbal Permission Granted              Emotional Assessment       Orientation: : Oriented to Self,Oriented to Place,Oriented to  Time,Oriented to Situation Alcohol / Substance Use: Not Applicable Psych Involvement: No (comment)  Admission diagnosis:  Acute pancreatitis [K85.90] Pancreatitis [K85.90] Epigastric abdominal pain [R10.13] Acute gallstone pancreatitis [K85.10] Patient Active Problem List   Diagnosis Date Noted  . Other pancytopenia ([place])   . Acute gallstone pancreatitis   . Pancytopenia ([place]) 04/24/2020  . Acute pancreatitis 04/24/2020  . 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 Calculus of bile duct without cholecystitis and without obstruction 04/16/2020  . Hepatocellular carcinoma ([place]) 12/26/2019  . Thrombocytopenia ([place]) 12/16/2016  . HTN (hypertension) 10/29/2012  .  Diabetes ([place]) 10/29/2012  . Other and unspecified hyperlipidemia 10/29/2012  . CAD (coronary artery disease), native coronary artery 10/29/2012  . S/P CABG x 3 10/29/2012   PCP:  Danelle Berry, NP Pharmacy:   [place], [place] - [place] [address] Phone: [phone] Fax: [phone]  [id number] [place], [place] [address] Phone: [phone] Fax: [phone]     Social Determinants of Health (SDOH) Interventions    Readmission Risk Interventions No flowsheet data found.

## 2020-04-26 NOTE — Progress Notes (Addendum)
MD order received in [facility] to discharge pt home today; verbally reviewed AVS with pt, [facility] [place]; also gave list of PCPs in the area in order for the pt to obtain a PCP; pt will call and get follow up appointments on Monday, 04/28/20 for Onc and PCP; no questions voiced at this time; pt's discharge pending arrival of his son-in-law at the [place] entrance; pt will use nurse callbell when his ride is there

## 2020-04-28 ENCOUNTER — Encounter: Payer: Self-pay | Admitting: Gastroenterology

## 2020-04-28 ENCOUNTER — Other Ambulatory Visit: Payer: Self-pay | Admitting: *Deleted

## 2020-04-28 ENCOUNTER — Telehealth: Payer: Self-pay | Admitting: Oncology

## 2020-04-28 DIAGNOSIS — D649 Anemia, unspecified: Secondary | ICD-10-CM

## 2020-04-28 DIAGNOSIS — C220 Liver cell carcinoma: Secondary | ICD-10-CM

## 2020-04-28 DIAGNOSIS — D696 Thrombocytopenia, unspecified: Secondary | ICD-10-CM

## 2020-04-28 LAB — GLUCOSE, CAPILLARY: Glucose-Capillary: 92 mg/dL (ref 70–99)

## 2020-04-28 NOTE — Telephone Encounter (Signed)
Spoke with patient to schedule labs and f/u with MD (per Dr, Janese Banks). Patient agreeable to day and time.

## 2020-04-29 ENCOUNTER — Encounter: Payer: Self-pay | Admitting: Gastroenterology

## 2020-04-29 ENCOUNTER — Other Ambulatory Visit: Payer: Self-pay

## 2020-04-29 ENCOUNTER — Encounter (HOSPITAL_COMMUNITY): Payer: Self-pay | Admitting: Hematology and Oncology

## 2020-04-29 ENCOUNTER — Ambulatory Visit: Payer: Medicare HMO | Admitting: Gastroenterology

## 2020-04-29 VITALS — BP 111/56 | HR 77 | Ht 72.0 in | Wt 207.0 lb

## 2020-04-29 DIAGNOSIS — R1013 Epigastric pain: Secondary | ICD-10-CM | POA: Diagnosis not present

## 2020-04-29 NOTE — Progress Notes (Signed)
Primary Care Physician: Danelle Berry, NP  Primary Gastroenterologist:  Dr. Lucilla Lame  Chief Complaint  Patient presents with  . Hospitalization Follow-up    HPI: Timothy Murray is a 75 y.o. male here after being discharged from the hospital and having an ERCP.  The patient has a history of hepatocellular carcinoma and originally was set up for an upper endoscopy by me a few weeks ago but came into the hospital with what appeared to be gallstone pancreatitis.  The patient had an MRCP that was negative at that time and was sent home pain-free.  The patient then came back recently with pain again and a repeat MRCP showed some sludge in the bile duct.  The patient underwent an ERCP with a sphincterotomy and sludge removal.  The patient was then discharged home and is now here to follow-up with me. The patient reports that he has been avoiding fatty and greasy foods because of him being told that [facility] by a surgeon there that that could make his pain worse since they had decided not to take out his gallbladder due to his hepatocellular carcinoma.  He is pain-free since his ERCP.  Past Medical History:  Diagnosis Date  . Arthritis    "lower back" (10/24/2012)  . Coronary artery disease   . GERD (gastroesophageal reflux disease)   . High cholesterol   . Hypertension   . Liver cancer ([place])   . Type II diabetes mellitus ([place])     Current Outpatient Medications  Medication Sig Dispense Refill  . atorvastatin (LIPITOR) 40 MG tablet Take 1 tablet by mouth daily.    Marland Kitchen glimepiride (AMARYL) 1 MG tablet Take 1 mg by mouth daily.    Marland Kitchen glucosamine-chondroitin 500-400 MG tablet Take 1 tablet by mouth daily.    Marland Kitchen losartan (COZAAR) 25 MG tablet Take 25 mg by mouth daily.    . metoprolol tartrate (LOPRESSOR) 25 MG tablet Take 25 mg by mouth daily.    Marland Kitchen omeprazole (PRILOSEC) 20 MG capsule Take 40 mg by mouth every morning.    Marland Kitchen oxyCODONE 10 MG TABS Take 1 tablet (10 mg total) by mouth every 4  (four) hours as needed for up to 7 days for moderate pain or severe pain. 42 tablet 0   No current facility-administered medications for this visit.    Allergies as of 04/29/2020 - Review Complete 04/29/2020  Allergen Reaction Noted  . Other Nausea And Vomiting 01/16/2015  . Soap & cleansers  10/24/2012  . Latex Rash and Itching 10/24/2012    ROS:  General: Negative for anorexia, weight loss, fever, chills, fatigue, weakness. ENT: Negative for hoarseness, difficulty swallowing , nasal congestion. CV: Negative for chest pain, angina, palpitations, dyspnea on exertion, peripheral edema.  Respiratory: Negative for dyspnea at rest, dyspnea on exertion, cough, sputum, wheezing.  GI: See history of present illness. GU:  Negative for dysuria, hematuria, urinary incontinence, urinary frequency, nocturnal urination.  Endo: Negative for unusual weight change.    Physical Examination:   BP (!) 111/56   Pulse 77   Ht 6' (1.829 m)   Wt 207 lb (93.9 kg)   BMI 28.07 kg/m   General: Well-nourished, well-developed in no acute distress.  Eyes: No icterus. Conjunctivae pink. Lungs: Clear to auscultation bilaterally. Non-labored. Heart: Regular rate and rhythm, no murmurs rubs or gallops.  Abdomen: Bowel sounds are normal, nontender, nondistended, no hepatosplenomegaly or masses, no abdominal bruits or hernia , no rebound or guarding.   Extremities: No  lower extremity edema. No clubbing or deformities. Neuro: Alert and oriented x 3.  Grossly intact. Skin: Warm and dry, no jaundice.   Psych: Alert and cooperative, normal mood and affect.  Labs:    Imaging Studies: CT ABDOMEN PELVIS W CONTRAST  Result Date: 04/16/2020 CLINICAL DATA:  Left upper quadrant abdominal pain. History of liver carcinoma. EXAM: CT ABDOMEN AND PELVIS WITH CONTRAST TECHNIQUE: Multidetector CT imaging of the abdomen and pelvis was performed using the standard protocol following bolus administration of intravenous  contrast. CONTRAST:  19m OMNIPAQUE IOHEXOL 300 MG/ML  SOLN COMPARISON:  01/01/2020 FINDINGS: Lower chest: No acute abnormality. Hepatobiliary: Well-defined hypo attenuate mass in segment 5, 3.9 x 3.1 x 3.3 cm, smaller than on the prior CT. Liver shows morphologic changes consistent with cirrhosis with central volume loss and enlargement of the lateral segment of the left lobe and caudate lobe and mild surface nodularity. No other liver masses or lesions. There are gallstones, but no gallbladder wall thickening or adjacent inflammation. Small stone projects in the distal common bile duct near the ampulla. Common bile duct is dilated to 8.5 mm. Pancreas: Hazy inflammatory change extends along the margins of the pancreas, most evident adjacent to the pancreatic head and tail, but also tracking along the duodenum with fluid attenuation noted along the anterior pararenal fascia, greater on the right. No pancreatic mass or significant duct dilation. Homogeneous pancreatic enhancement is noted. Spleen: Prominent spleen measuring 14 cm in greatest dimension. No splenic mass or focal lesion. Adrenals/Urinary Tract: No adrenal masses. Kidneys are normal in size, orientation and position with symmetric enhancement and excretion. 1.8 cm cyst, upper pole of the left kidney. Tiny adjacent low-density lesion likely also a cyst. No other renal masses or lesions. No stones. No hydronephrosis. Ureters normal in course and in caliber. Bladder is unremarkable. Stomach/Bowel: Normal stomach. Fluid attenuation/inflammatory changes track along the duodenum. Two small stones project in the second portion of the duodenum adjacent to the anterior L. Small bowel and colon are normal in caliber. No wall thickening. No inflammation. Vascular/Lymphatic: Aortic atherosclerosis. No evidence of thrombosis of the portal vein, splenic vein or superior mesenteric vein. No enlarged lymph nodes. Reproductive: Mild prostate enlargement transverse,  dimension. 5.2 cm in greatest Other: None. Musculoskeletal: No fracture or acute finding.  No bone lesion. IMPRESSION: 1. Acute uncomplicated pancreatitis. Specifically, no evidence of pancreatic necrosis or of an abscess/pseudocyst. No venous thrombosis. Etiology appears due to passage of a gallstone. 2. Choledocholithiasis. Small gallstone projects in the distal common bile duct near the ampulla Vater with two other small stones noted in the adjacent second portion of the duodenum. Mild dilation of the common bile duct. There are several stones within the gallbladder. 3. 3.9 cm hypoattenuating, rim enhancing liver mass, smaller than on the prior CT, presumably the residua of treated liver carcinoma. 4. Cirrhosis, appears stable from the prior CT. 5. Aortic atherosclerosis. Electronically Signed   By: DLajean ManesM.D.   On: 04/16/2020 16:43   MR 3D Recon At Scanner  Result Date: 04/24/2020 CLINICAL DATA:  Pancreatitis.  History of hepatocellular carcinoma. EXAM: MRI ABDOMEN WITHOUT AND WITH CONTRAST (INCLUDING MRCP) TECHNIQUE: Multiplanar multisequence MR imaging of the abdomen was performed both before and after the administration of intravenous contrast. Heavily T2-weighted images of the biliary and pancreatic ducts were obtained, and three-dimensional MRCP images were rendered by post processing. CONTRAST:  962mGADAVIST GADOBUTROL 1 MMOL/ML IV SOLN COMPARISON:  04/17/2020. FINDINGS: Lower chest: No acute findings. Hepatobiliary: Morphologic features  of cirrhosis are again noted involving the liver. The centrally necrotic mass in segment 5 of the liver with 7 mm of enhancing marginal thickness is again noted, image 29/17. On today's study this measures 4.5 by 3.5 cm, image 29/17. Previously 4.3 x 3.6 cm. High T1 signal within the gallbladder is again noted. On the MRCP images in the CBD measures 0.9 cm, image 6/14. This is compared with 5 mm on 04/17/2020. High T1 signal and low T2 signal sludge/debris (and  possibly small stones) fill the distal common bile duct, image 14/3. Pancreas: There is diffuse pancreatic edema and peripancreatic fluid compatible with acute pancreatitis. This is increased when compared with the previous exam. Interval increase in caliber of the main pancreatic duct which measures 4 mm, image 22/8. Previously 2 mm. No discrete mature fluid collections identified to suggest pseudocyst. No signs of pancreatic necrosis. Spleen: Spleen has a normal size measuring 10 cm in length. No focal splenic abnormality. Adrenals/Urinary Tract: Normal appearance of the adrenal glands. No hydronephrosis identified bilaterally. Upper pole left kidney cyst measures 1.8 cm. Stomach/Bowel: Visualized portions within the abdomen are unremarkable. Vascular/Lymphatic: Aortic atherosclerosis. No aneurysm portal vein and splenic vein remain patent. No abdominal adenopathy. Other: Trace free fluid along the pericolic gutters. No discrete, drainable fluid collection identified. Musculoskeletal: No suspicious bone lesions identified. IMPRESSION: 1. No significant change in size of centrally necrotic mass in segment 5 of the liver compatible with known hepatocellular carcinoma. Currently measures 4.5 by 3.5 cm. Previously measured 4.3 x 3.6 cm. 2. Interval progression acute pancreatitis with increased edema and surrounding fluid. No pseudo cyst or signs of pancreatic necrosis. 3. Increase in caliber of the CBD and main pancreatic duct. The distal common bile duct is filled with high T1, low T2 debris/sludge and possibly small stones, new from previous exam. 4. Morphologic features of cirrhosis. 5. Trace free fluid along the pericolic gutters. Electronically Signed   By: Kerby Moors M.D.   On: 04/24/2020 08:21   MR 3D Recon At Scanner  Result Date: 04/17/2020 CLINICAL DATA:  Prior gallstone pancreatitis, hepatocellular carcinoma. Abdominal pain. Choledocholithiasis on CT scan yesterday. EXAM: MRI ABDOMEN WITHOUT AND  WITH CONTRAST (INCLUDING MRCP) TECHNIQUE: Multiplanar multisequence MR imaging of the abdomen was performed both before and after the administration of intravenous contrast. Heavily T2-weighted images of the biliary and pancreatic ducts were obtained, and three-dimensional MRCP images were rendered by post processing. CONTRAST:  92m GADAVIST GADOBUTROL 1 MMOL/ML IV SOLN COMPARISON:  CT abdomen 04/16/2020 FINDINGS: Lower chest: Median sternotomy. Hepatobiliary: Hepatic cirrhosis. 4.3 by 2.9 by 3.6 cm mass in segment 5 of the liver, mostly with central necrosis but with enhancing marginal thickness of up to about 0.7 cm and with some speckled potentially enhancing internal nodular elements. High T1 signal in the gallbladder. Suspected small gallstones in the gallbladder. On the 3D MRCP images, the common bile duct measures 0.5 cm in diameter (previously about 0.8 cm on yesterday CT scan) at although the dedicated MRCP images such image 33 of series 12 do not show filling defects, there is some questionable granularity in the distal CBD region for example on image 11 of series 14 which could conceivably indicate tiny residual calculi. However, these do not appear to be as prominent as the string of calculi shown on 04/16/2020 CT scan. Pancreas: Unremarkable where included. 0.4 cm cystic lesion in the junction of the pancreatic body and tail on image 14 series 14. No findings of pancreatic abscess, necrosis, or pseudocyst. Subtle  peripancreatic edema. Spleen:  Unremarkable Adrenals/Urinary Tract: Simple appearing left kidney upper pole cyst. Adrenal glands unremarkable. Stomach/Bowel: Unremarkable Vascular/Lymphatic:  Aortoiliac atherosclerotic vascular disease. Other:  Trace fluid in the right paracolic gutter. Musculoskeletal: Unremarkable IMPRESSION: 1. Low-grade peripancreatic edema may be a manifestation of prior pancreatitis. 2. Most if not all of the prior distal CBD stones appear to of cleared. On the MRCP  images the distal CBD appears normal and there is no more extrahepatic biliary dilatation, although on some of the other coronal sequences there is a subtle granularity in the distal CBD which could represent residual tiny stone(s) but substantially smaller and less prominent than on recent CT. 3. Small gallstones in the gallbladder. 4. Centrally necrotic mass in segment 5 of the liver. 5. Hepatic cirrhosis. 6.  Aortic Atherosclerosis (ICD10-I70.0). 7. Trace fluid in the right paracolic gutter. Electronically Signed   By: Van Clines M.D.   On: 04/17/2020 13:00   CT BONE MARROW BIOPSY & ASPIRATION  Result Date: 04/22/2020 CLINICAL DATA:  Thrombocytopenia. Possible ITP. History of hepatocellular carcinoma. EXAM: CT GUIDED BONE MARROW ASPIRATION AND BIOPSY ANESTHESIA/SEDATION: Versed 1.0 mg IV, Fentanyl 50 mcg IV Total Moderate Sedation Time:   15 minutes. The patient's level of consciousness and physiologic status were continuously monitored during the procedure by Radiology nursing. PROCEDURE: The procedure risks, benefits, and alternatives were explained to the patient. Questions regarding the procedure were encouraged and answered. The patient understands and consents to the procedure. A time out was performed prior to initiating the procedure. The right gluteal region was prepped with chlorhexidine. Sterile gown and sterile gloves were used for the procedure. Local anesthesia was provided with 1% Lidocaine. Under CT guidance, an 11 gauge On Control bone cutting needle was advanced from a posterior approach into the right iliac bone. Needle positioning was confirmed with CT. Initial non heparinized and heparinized aspirate samples were obtained of bone marrow. Core biopsy was performed via the On Control drill needle. COMPLICATIONS: None FINDINGS: Inspection of initial aspirate did reveal visible particles. Intact core biopsy sample was obtained. IMPRESSION: CT guided bone marrow biopsy of right  posterior iliac bone with both aspirate and core samples obtained. Electronically Signed   By: Aletta Edouard M.D.   On: 04/22/2020 11:26   DG C-Arm 1-60 Min-No Report  Result Date: 04/25/2020 Fluoroscopy was utilized by the requesting physician.  No radiographic interpretation.   MR ABDOMEN MRCP W WO CONTAST  Result Date: 04/24/2020 CLINICAL DATA:  Pancreatitis.  History of hepatocellular carcinoma. EXAM: MRI ABDOMEN WITHOUT AND WITH CONTRAST (INCLUDING MRCP) TECHNIQUE: Multiplanar multisequence MR imaging of the abdomen was performed both before and after the administration of intravenous contrast. Heavily T2-weighted images of the biliary and pancreatic ducts were obtained, and three-dimensional MRCP images were rendered by post processing. CONTRAST:  88m GADAVIST GADOBUTROL 1 MMOL/ML IV SOLN COMPARISON:  04/17/2020. FINDINGS: Lower chest: No acute findings. Hepatobiliary: Morphologic features of cirrhosis are again noted involving the liver. The centrally necrotic mass in segment 5 of the liver with 7 mm of enhancing marginal thickness is again noted, image 29/17. On today's study this measures 4.5 by 3.5 cm, image 29/17. Previously 4.3 x 3.6 cm. High T1 signal within the gallbladder is again noted. On the MRCP images in the CBD measures 0.9 cm, image 6/14. This is compared with 5 mm on 04/17/2020. High T1 signal and low T2 signal sludge/debris (and possibly small stones) fill the distal common bile duct, image 14/3. Pancreas: There is diffuse pancreatic edema  and peripancreatic fluid compatible with acute pancreatitis. This is increased when compared with the previous exam. Interval increase in caliber of the main pancreatic duct which measures 4 mm, image 22/8. Previously 2 mm. No discrete mature fluid collections identified to suggest pseudocyst. No signs of pancreatic necrosis. Spleen: Spleen has a normal size measuring 10 cm in length. No focal splenic abnormality. Adrenals/Urinary Tract: Normal  appearance of the adrenal glands. No hydronephrosis identified bilaterally. Upper pole left kidney cyst measures 1.8 cm. Stomach/Bowel: Visualized portions within the abdomen are unremarkable. Vascular/Lymphatic: Aortic atherosclerosis. No aneurysm portal vein and splenic vein remain patent. No abdominal adenopathy. Other: Trace free fluid along the pericolic gutters. No discrete, drainable fluid collection identified. Musculoskeletal: No suspicious bone lesions identified. IMPRESSION: 1. No significant change in size of centrally necrotic mass in segment 5 of the liver compatible with known hepatocellular carcinoma. Currently measures 4.5 by 3.5 cm. Previously measured 4.3 x 3.6 cm. 2. Interval progression acute pancreatitis with increased edema and surrounding fluid. No pseudo cyst or signs of pancreatic necrosis. 3. Increase in caliber of the CBD and main pancreatic duct. The distal common bile duct is filled with high T1, low T2 debris/sludge and possibly small stones, new from previous exam. 4. Morphologic features of cirrhosis. 5. Trace free fluid along the pericolic gutters. Electronically Signed   By: Kerby Moors M.D.   On: 04/24/2020 08:21   MR ABDOMEN MRCP W WO CONTAST  Result Date: 04/17/2020 CLINICAL DATA:  Prior gallstone pancreatitis, hepatocellular carcinoma. Abdominal pain. Choledocholithiasis on CT scan yesterday. EXAM: MRI ABDOMEN WITHOUT AND WITH CONTRAST (INCLUDING MRCP) TECHNIQUE: Multiplanar multisequence MR imaging of the abdomen was performed both before and after the administration of intravenous contrast. Heavily T2-weighted images of the biliary and pancreatic ducts were obtained, and three-dimensional MRCP images were rendered by post processing. CONTRAST:  39m GADAVIST GADOBUTROL 1 MMOL/ML IV SOLN COMPARISON:  CT abdomen 04/16/2020 FINDINGS: Lower chest: Median sternotomy. Hepatobiliary: Hepatic cirrhosis. 4.3 by 2.9 by 3.6 cm mass in segment 5 of the liver, mostly with central  necrosis but with enhancing marginal thickness of up to about 0.7 cm and with some speckled potentially enhancing internal nodular elements. High T1 signal in the gallbladder. Suspected small gallstones in the gallbladder. On the 3D MRCP images, the common bile duct measures 0.5 cm in diameter (previously about 0.8 cm on yesterday CT scan) at although the dedicated MRCP images such image 33 of series 12 do not show filling defects, there is some questionable granularity in the distal CBD region for example on image 11 of series 14 which could conceivably indicate tiny residual calculi. However, these do not appear to be as prominent as the string of calculi shown on 04/16/2020 CT scan. Pancreas: Unremarkable where included. 0.4 cm cystic lesion in the junction of the pancreatic body and tail on image 14 series 14. No findings of pancreatic abscess, necrosis, or pseudocyst. Subtle peripancreatic edema. Spleen:  Unremarkable Adrenals/Urinary Tract: Simple appearing left kidney upper pole cyst. Adrenal glands unremarkable. Stomach/Bowel: Unremarkable Vascular/Lymphatic:  Aortoiliac atherosclerotic vascular disease. Other:  Trace fluid in the right paracolic gutter. Musculoskeletal: Unremarkable IMPRESSION: 1. Low-grade peripancreatic edema may be a manifestation of prior pancreatitis. 2. Most if not all of the prior distal CBD stones appear to of cleared. On the MRCP images the distal CBD appears normal and there is no more extrahepatic biliary dilatation, although on some of the other coronal sequences there is a subtle granularity in the distal CBD which could represent  residual tiny stone(s) but substantially smaller and less prominent than on recent CT. 3. Small gallstones in the gallbladder. 4. Centrally necrotic mass in segment 5 of the liver. 5. Hepatic cirrhosis. 6.  Aortic Atherosclerosis (ICD10-I70.0). 7. Trace fluid in the right paracolic gutter. Electronically Signed   By: Van Clines M.D.   On:  04/17/2020 13:00   US ABDOMEN LIMITED RUQ (LIVER/GB)  Result Date: 04/24/2020 CLINICAL DATA:  Epigastric pain EXAM: ULTRASOUND ABDOMEN LIMITED RIGHT UPPER QUADRANT COMPARISON:  MRI abdomen 04/17/2020 FINDINGS: Gallbladder: There are sludge and stones in the gallbladder. A negative sonographic Percell Miller sign was reported by the sonographer. No pericholecystic fluid. Mild wall thickening. Common bile duct: Diameter: 6 mm Liver: Redemonstration of mass within the right hepatic lobe, predominantly hyperechoic. This is better characterized on recent MRI. Nodular liver contour. Portal vein is patent on color Doppler imaging with normal direction of blood flow towards the liver. Other: None. IMPRESSION: 1. Cholelithiasis and mild gallbladder wall thickening. No other evidence of acute cholecystitis. 2. Hepatic cirrhosis and right hepatic lobe mass, better characterized on recent abdominal MRI. Electronically Signed   By: Ulyses Jarred M.D.   On: 04/24/2020 02:54    Assessment and Plan:   Turon Kilmer is a 75 y.o. y/o male who comes in for follow-up after having an ERCP with a sphincterotomy.  The patient did not have a stent placed because he had responded so well to platelet infusions prior to the procedure and had no sign of bleeding after the procedure.  The patient will follow-up with oncology for his hepatocellular carcinoma.     Lucilla Lame, MD. Marval Regal    Note: This dictation was prepared with Dragon dictation along with smaller phrase technology. Any transcriptional errors that result from this process are unintentional.

## 2020-05-01 ENCOUNTER — Encounter (HOSPITAL_COMMUNITY): Payer: Self-pay | Admitting: Hematology and Oncology

## 2020-05-01 DIAGNOSIS — E785 Hyperlipidemia, unspecified: Secondary | ICD-10-CM | POA: Diagnosis not present

## 2020-05-01 DIAGNOSIS — I10 Essential (primary) hypertension: Secondary | ICD-10-CM | POA: Diagnosis not present

## 2020-05-01 DIAGNOSIS — I509 Heart failure, unspecified: Secondary | ICD-10-CM | POA: Diagnosis not present

## 2020-05-01 DIAGNOSIS — E138 Other specified diabetes mellitus with unspecified complications: Secondary | ICD-10-CM | POA: Diagnosis not present

## 2020-05-01 DIAGNOSIS — R0602 Shortness of breath: Secondary | ICD-10-CM | POA: Diagnosis not present

## 2020-05-01 DIAGNOSIS — I2510 Atherosclerotic heart disease of native coronary artery without angina pectoris: Secondary | ICD-10-CM | POA: Diagnosis not present

## 2020-05-01 DIAGNOSIS — D508 Other iron deficiency anemias: Secondary | ICD-10-CM | POA: Diagnosis not present

## 2020-05-01 DIAGNOSIS — R609 Edema, unspecified: Secondary | ICD-10-CM | POA: Diagnosis not present

## 2020-05-01 NOTE — Anesthesia Postprocedure Evaluation (Signed)
Anesthesia Post Note  Patient: Timothy Murray  Procedure(s) Performed: ENDOSCOPIC RETROGRADE CHOLANGIOPANCREATOGRAPHY (ERCP) (N/A )  Patient location during evaluation: PACU Anesthesia Type: General Level of consciousness: awake and alert Pain management: pain level controlled Vital Signs Assessment: post-procedure vital signs reviewed and stable Respiratory status: spontaneous breathing, nonlabored ventilation, respiratory function stable and patient connected to nasal cannula oxygen Cardiovascular status: blood pressure returned to baseline and stable Postop Assessment: no apparent nausea or vomiting Anesthetic complications: no   No complications documented.   Last Vitals:  Vitals:   04/26/20 0507 04/26/20 0828  BP: 119/61 (!) 129/50  Pulse: 60 71  Resp: 16 18  Temp: 36.7 C 36.5 C  SpO2: 100% 100%    Last Pain:  Vitals:   04/26/20 0854  TempSrc:   PainSc: 0-No pain                 Molli Barrows

## 2020-05-06 ENCOUNTER — Encounter (HOSPITAL_COMMUNITY): Payer: Self-pay | Admitting: Hematology and Oncology

## 2020-05-06 ENCOUNTER — Ambulatory Visit: Admission: RE | Admit: 2020-05-06 | Payer: Medicare HMO | Source: Home / Self Care | Admitting: Gastroenterology

## 2020-05-06 ENCOUNTER — Encounter: Admission: RE | Payer: Self-pay | Source: Home / Self Care

## 2020-05-06 SURGERY — ESOPHAGOGASTRODUODENOSCOPY (EGD) WITH PROPOFOL
Anesthesia: General

## 2020-05-08 DIAGNOSIS — R0602 Shortness of breath: Secondary | ICD-10-CM | POA: Diagnosis not present

## 2020-05-12 ENCOUNTER — Other Ambulatory Visit (HOSPITAL_COMMUNITY): Payer: Self-pay

## 2020-05-12 ENCOUNTER — Telehealth: Payer: Self-pay | Admitting: Pharmacy Technician

## 2020-05-12 ENCOUNTER — Encounter: Payer: Self-pay | Admitting: Oncology

## 2020-05-12 ENCOUNTER — Inpatient Hospital Stay: Payer: Medicare HMO | Attending: Hematology and Oncology

## 2020-05-12 ENCOUNTER — Inpatient Hospital Stay: Payer: Medicare HMO | Admitting: Pharmacist

## 2020-05-12 ENCOUNTER — Inpatient Hospital Stay (HOSPITAL_BASED_OUTPATIENT_CLINIC_OR_DEPARTMENT_OTHER): Payer: Medicare HMO | Admitting: Oncology

## 2020-05-12 VITALS — BP 102/56 | HR 72 | Temp 97.9°F | Resp 20 | Wt 201.9 lb

## 2020-05-12 DIAGNOSIS — K851 Biliary acute pancreatitis without necrosis or infection: Secondary | ICD-10-CM | POA: Diagnosis not present

## 2020-05-12 DIAGNOSIS — D46C Myelodysplastic syndrome with isolated del(5q) chromosomal abnormality: Secondary | ICD-10-CM

## 2020-05-12 DIAGNOSIS — D649 Anemia, unspecified: Secondary | ICD-10-CM | POA: Diagnosis not present

## 2020-05-12 DIAGNOSIS — R5383 Other fatigue: Secondary | ICD-10-CM | POA: Diagnosis not present

## 2020-05-12 DIAGNOSIS — K7460 Unspecified cirrhosis of liver: Secondary | ICD-10-CM | POA: Insufficient documentation

## 2020-05-12 DIAGNOSIS — C220 Liver cell carcinoma: Secondary | ICD-10-CM | POA: Diagnosis not present

## 2020-05-12 DIAGNOSIS — Z5111 Encounter for antineoplastic chemotherapy: Secondary | ICD-10-CM | POA: Diagnosis present

## 2020-05-12 DIAGNOSIS — D696 Thrombocytopenia, unspecified: Secondary | ICD-10-CM

## 2020-05-12 DIAGNOSIS — M545 Low back pain, unspecified: Secondary | ICD-10-CM | POA: Diagnosis not present

## 2020-05-12 LAB — CBC WITH DIFFERENTIAL/PLATELET
Abs Immature Granulocytes: 0.03 10*3/uL (ref 0.00–0.07)
Basophils Absolute: 0 10*3/uL (ref 0.0–0.1)
Basophils Relative: 1 %
Eosinophils Absolute: 0 10*3/uL (ref 0.0–0.5)
Eosinophils Relative: 1 %
HCT: 17.6 % — ABNORMAL LOW (ref 39.0–52.0)
Hemoglobin: 6.2 g/dL — ABNORMAL LOW (ref 13.0–17.0)
Immature Granulocytes: 3 %
Lymphocytes Relative: 33 %
Lymphs Abs: 0.4 10*3/uL — ABNORMAL LOW (ref 0.7–4.0)
MCH: 36 pg — ABNORMAL HIGH (ref 26.0–34.0)
MCHC: 35.2 g/dL (ref 30.0–36.0)
MCV: 102.3 fL — ABNORMAL HIGH (ref 80.0–100.0)
Monocytes Absolute: 0.1 10*3/uL (ref 0.1–1.0)
Monocytes Relative: 10 %
Neutro Abs: 0.6 10*3/uL — ABNORMAL LOW (ref 1.7–7.7)
Neutrophils Relative %: 52 %
Platelets: 33 10*3/uL — ABNORMAL LOW (ref 150–400)
RBC: 1.72 MIL/uL — ABNORMAL LOW (ref 4.22–5.81)
RDW: 17.9 % — ABNORMAL HIGH (ref 11.5–15.5)
Smear Review: NORMAL
WBC: 1.1 10*3/uL — CL (ref 4.0–10.5)
nRBC: 0 % (ref 0.0–0.2)

## 2020-05-12 LAB — COMPREHENSIVE METABOLIC PANEL
ALT: 18 U/L (ref 0–44)
AST: 22 U/L (ref 15–41)
Albumin: 3.8 g/dL (ref 3.5–5.0)
Alkaline Phosphatase: 67 U/L (ref 38–126)
Anion gap: 11 (ref 5–15)
BUN: 17 mg/dL (ref 8–23)
CO2: 24 mmol/L (ref 22–32)
Calcium: 8.5 mg/dL — ABNORMAL LOW (ref 8.9–10.3)
Chloride: 102 mmol/L (ref 98–111)
Creatinine, Ser: 1.06 mg/dL (ref 0.61–1.24)
GFR, Estimated: >60 mL/min (ref >60)
Glucose, Bld: 212 mg/dL — ABNORMAL HIGH (ref 70–99)
Potassium: 4.1 mmol/L (ref 3.5–5.1)
Sodium: 137 mmol/L (ref 135–145)
Total Bilirubin: 2.4 mg/dL — ABNORMAL HIGH (ref 0.3–1.2)
Total Protein: 6.3 g/dL — ABNORMAL LOW (ref 6.5–8.1)

## 2020-05-12 LAB — PREPARE RBC (CROSSMATCH)

## 2020-05-12 MED ORDER — LENALIDOMIDE 10 MG PO CAPS: 10.0000 mg | ORAL_CAPSULE | Freq: Every day | ORAL | 0 refills | Status: DC

## 2020-05-12 NOTE — Progress Notes (Signed)
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  He has baseline thrombocytopenia which was initially attributed to his cirrhosis.  He was found to have progressive pancytopenia with a steady decline in his white cell count since March 2022 when it was down to 3.4 with a platelet count of 53.  He was hospitalized sometime in April 2022 with acute biliary gallstone pancreatitis .  He underwent ERCP inpatient with platelet transfusion which showed sludge and biliary sphincterotomy was performed.  His bilirubin which was elevated up to 7.2 has come down to 2.4 presently.  However his pancytopenia has continued to worsen and presently white count is 1.1 with an H&H of 6.2/17.6 with an MCV of 102 and a platelet count of 33.Bone marrow biopsy showed evidence of dyspoietic changes in the erythroid and megakaryocyte excel lines associated with ring sideroblasts.  Definite increase in blastic cells not identified.  Overall features favor myelodysplastic syndrome.  Karyotype was normal but only for metaphase cells were analyzed which were not sufficient.  [facility] FISH MDS panel showed deletion of 5 q. along with deletion 7 q. without excess blasts.  SF 3 B1 mutation negative.  With regards to his hepatocellular carcinoma patient had an MRI abdomen on 04/24/2020  which showed that his primary liver mass which was previously 4.3 x 3.6 cmWas similar in size at 4.5 x 3.5 cm.  Interval history-patient currently reports feeling fatigued.  He reports bilateral lower extremity edema  ECOG PS- 2-3 Pain scale- 2 Opioid associated constipation- no  Review of systems- Review of Systems  Constitutional: Positive for malaise/fatigue. Negative for chills, fever and weight loss.  HENT: Negative for congestion, ear discharge and nosebleeds.   Eyes: Negative for blurred vision.  Respiratory: Negative for cough, hemoptysis, sputum production, shortness of breath and wheezing.   Cardiovascular: Positive for leg swelling. Negative for chest pain, palpitations, orthopnea and claudication.  Gastrointestinal: Negative for abdominal pain, blood in stool, constipation, diarrhea, heartburn, melena, nausea and vomiting.  Genitourinary: Negative for dysuria, flank pain, frequency, hematuria and urgency.  Musculoskeletal: Negative for back pain, joint pain and myalgias.  Skin: Negative for rash.  Neurological: Negative for dizziness, tingling, focal weakness, seizures, weakness and headaches.  Endo/Heme/Allergies: Does not bruise/bleed easily.  Psychiatric/Behavioral: Negative for depression and suicidal ideas. The patient does not have insomnia.       Allergies  Allergen Reactions  . Other Nausea And Vomiting  . Soap & Cleansers     "liquid soap" antibacterial -rash  . Latex Rash and Itching     Past Medical History:  Diagnosis Date  . Arthritis    "lower back" (10/24/2012)  . Coronary artery disease   . GERD (gastroesophageal reflux disease)   . High cholesterol   . Hypertension   . Liver cancer ([place])   . Type II diabetes mellitus ([place])      Past Surgical History:  Procedure  Laterality Date  . APPENDECTOMY  2002  . CARDIAC CATHETERIZATION  10/24/2012  . CORONARY ARTERY BYPASS GRAFT N/A 10/26/2012   Procedure: CORONARY ARTERY BYPASS GRAFTING (CABG);   Surgeon: Melrose Nakayama, MD;  Location: [place];  Service: Open Heart Surgery;  Laterality: N/A;  Times 3 using left internal mammary artery and endoscopically harvested right saphenous vein  . ERCP N/A 04/25/2020   Procedure: ENDOSCOPIC RETROGRADE CHOLANGIOPANCREATOGRAPHY (ERCP);  Surgeon: Lucilla Lame, MD;  Location: [facility] ENDOSCOPY;  Service: Endoscopy;  Laterality: N/A;    Social History   Socioeconomic History  . Marital status: Married    Spouse name: Not on file  . Number of children: Not on file  . Years of education: Not on file  . Highest education level: Not on file  Occupational History  . Not on file  Tobacco Use  . Smoking status: Former Smoker    Packs/day: 0.50    Years: 30.00    Pack years: 15.00    Types: Cigarettes    Quit date: 08/28/2001    Years since quitting: 18.7  . Smokeless tobacco: Never Used  Vaping Use  . Vaping Use: Not on file  Substance and Sexual Activity  . Alcohol use: No    Alcohol/week: 0.0 standard drinks    Comment: 10/24/2012 "stopped drinking alcohol in 2003; used to drink ~ 9 bottles beer/wk"  . Drug use: No  . Sexual activity: Yes  Other Topics Concern  . Not on file  Social History Narrative  . Not on file   Social Determinants of Health   Financial Resource Strain: Not on file  Food Insecurity: Not on file  Transportation Needs: Not on file  Physical Activity: Not on file  Stress: Not on file  Social Connections: Not on file  Intimate Partner Violence: Not on file    Family History  Problem Relation Age of Onset  . Cancer Sister      Current Outpatient Medications:  .  atorvastatin (LIPITOR) 40 MG tablet, Take 1 tablet by mouth daily., Disp: , Rfl:  .  glimepiride (AMARYL) 1 MG tablet, Take 1 mg by mouth daily., Disp: , Rfl:  .  glucosamine-chondroitin 500-400 MG tablet, Take 1 tablet by mouth daily., Disp: , Rfl:  .  losartan (COZAAR) 25 MG tablet, Take 25 mg by mouth daily., Disp: , Rfl:  .  metoprolol  tartrate (LOPRESSOR) 25 MG tablet, Take 25 mg by mouth daily., Disp: , Rfl:  .  omeprazole (PRILOSEC) 20 MG capsule, Take 40 mg by mouth every morning., Disp: , Rfl:  .  lenalidomide (REVLIMID) 10 MG capsule, Take 1 capsule (10 mg total) by mouth daily., Disp: 28 capsule, Rfl: 0  Physical exam:  Vitals:   05/12/20 1106 05/12/20 1109  BP:  (!) 102/56  Pulse: 72   Resp: 20   Temp: 97.9 F (36.6 C)   TempSrc: Tympanic   SpO2: 100%   Weight: 201 lb 14.4 oz (91.6 kg)    Physical Exam Constitutional:      Comments: He is sitting in a wheelchair.  Appears fatigued.  Cardiovascular:     Rate and Rhythm: Normal rate and regular rhythm.     Heart sounds: Normal heart sounds.  Pulmonary:     Effort: Pulmonary effort is normal.     Breath sounds: Normal breath sounds.  Abdominal:     General: Bowel sounds are normal.     Palpations: Abdomen is soft.  Musculoskeletal:     Right  lower leg: Edema present.     Left lower leg: Edema present.  Skin:    General: Skin is warm and dry.  Neurological:     Mental Status: He is alert and oriented to person, place, and time.      CMP Latest Ref Rng & Units 05/12/2020  Glucose 70 - 99 mg/dL 212(H)  BUN 8 - 23 mg/dL 17  Creatinine 0.61 - 1.24 mg/dL 1.06  Sodium 135 - 145 mmol/L 137  Potassium 3.5 - 5.1 mmol/L 4.1  Chloride 98 - 111 mmol/L 102  CO2 22 - 32 mmol/L 24  Calcium 8.9 - 10.3 mg/dL 8.5(L)  Total Protein 6.5 - 8.1 g/dL 6.3(L)  Total Bilirubin 0.3 - 1.2 mg/dL 2.4(H)  Alkaline Phos 38 - 126 U/L 67  AST 15 - 41 U/L 22  ALT 0 - 44 U/L 18   CBC Latest Ref Rng & Units 05/12/2020  WBC 4.0 - 10.5 K/uL 1.1(LL)  Hemoglobin 13.0 - 17.0 g/dL 6.2(L)  Hematocrit 39.0 - 52.0 % 17.6(L)  Platelets 150 - 400 K/uL 33(L)    No images are attached to the encounter.  CT ABDOMEN PELVIS W CONTRAST  Result Date: 04/16/2020 CLINICAL DATA:  Left upper quadrant abdominal pain. History of liver carcinoma. EXAM: CT ABDOMEN AND PELVIS WITH CONTRAST  TECHNIQUE: Multidetector CT imaging of the abdomen and pelvis was performed using the standard protocol following bolus administration of intravenous contrast. CONTRAST:  158m OMNIPAQUE IOHEXOL 300 MG/ML  SOLN COMPARISON:  01/01/2020 FINDINGS: Lower chest: No acute abnormality. Hepatobiliary: Well-defined hypo attenuate mass in segment 5, 3.9 x 3.1 x 3.3 cm, smaller than on the prior CT. Liver shows morphologic changes consistent with cirrhosis with central volume loss and enlargement of the lateral segment of the left lobe and caudate lobe and mild surface nodularity. No other liver masses or lesions. There are gallstones, but no gallbladder wall thickening or adjacent inflammation. Small stone projects in the distal common bile duct near the ampulla. Common bile duct is dilated to 8.5 mm. Pancreas: Hazy inflammatory change extends along the margins of the pancreas, most evident adjacent to the pancreatic head and tail, but also tracking along the duodenum with fluid attenuation noted along the anterior pararenal fascia, greater on the right. No pancreatic mass or significant duct dilation. Homogeneous pancreatic enhancement is noted. Spleen: Prominent spleen measuring 14 cm in greatest dimension. No splenic mass or focal lesion. Adrenals/Urinary Tract: No adrenal masses. Kidneys are normal in size, orientation and position with symmetric enhancement and excretion. 1.8 cm cyst, upper pole of the left kidney. Tiny adjacent low-density lesion likely also a cyst. No other renal masses or lesions. No stones. No hydronephrosis. Ureters normal in course and in caliber. Bladder is unremarkable. Stomach/Bowel: Normal stomach. Fluid attenuation/inflammatory changes track along the duodenum. Two small stones project in the second portion of the duodenum adjacent to the anterior L. Small bowel and colon are normal in caliber. No wall thickening. No inflammation. Vascular/Lymphatic: Aortic atherosclerosis. No evidence of  thrombosis of the portal vein, splenic vein or superior mesenteric vein. No enlarged lymph nodes. Reproductive: Mild prostate enlargement transverse, dimension. 5.2 cm in greatest Other: None. Musculoskeletal: No fracture or acute finding.  No bone lesion. IMPRESSION: 1. Acute uncomplicated pancreatitis. Specifically, no evidence of pancreatic necrosis or of an abscess/pseudocyst. No venous thrombosis. Etiology appears due to passage of a gallstone. 2. Choledocholithiasis. Small gallstone projects in the distal common bile duct near the ampulla Vater with two other small stones noted  in the adjacent second portion of the duodenum. Mild dilation of the common bile duct. There are several stones within the gallbladder. 3. 3.9 cm hypoattenuating, rim enhancing liver mass, smaller than on the prior CT, presumably the residua of treated liver carcinoma. 4. Cirrhosis, appears stable from the prior CT. 5. Aortic atherosclerosis. Electronically Signed   By: Lajean Manes M.D.   On: 04/16/2020 16:43   MR 3D Recon At Scanner  Result Date: 04/24/2020 CLINICAL DATA:  Pancreatitis.  History of hepatocellular carcinoma. EXAM: MRI ABDOMEN WITHOUT AND WITH CONTRAST (INCLUDING MRCP) TECHNIQUE: Multiplanar multisequence MR imaging of the abdomen was performed both before and after the administration of intravenous contrast. Heavily T2-weighted images of the biliary and pancreatic ducts were obtained, and three-dimensional MRCP images were rendered by post processing. CONTRAST:  8m GADAVIST GADOBUTROL 1 MMOL/ML IV SOLN COMPARISON:  04/17/2020. FINDINGS: Lower chest: No acute findings. Hepatobiliary: Morphologic features of cirrhosis are again noted involving the liver. The centrally necrotic mass in segment 5 of the liver with 7 mm of enhancing marginal thickness is again noted, image 29/17. On today's study this measures 4.5 by 3.5 cm, image 29/17. Previously 4.3 x 3.6 cm. High T1 signal within the gallbladder is again noted. On  the MRCP images in the CBD measures 0.9 cm, image 6/14. This is compared with 5 mm on 04/17/2020. High T1 signal and low T2 signal sludge/debris (and possibly small stones) fill the distal common bile duct, image 14/3. Pancreas: There is diffuse pancreatic edema and peripancreatic fluid compatible with acute pancreatitis. This is increased when compared with the previous exam. Interval increase in caliber of the main pancreatic duct which measures 4 mm, image 22/8. Previously 2 mm. No discrete mature fluid collections identified to suggest pseudocyst. No signs of pancreatic necrosis. Spleen: Spleen has a normal size measuring 10 cm in length. No focal splenic abnormality. Adrenals/Urinary Tract: Normal appearance of the adrenal glands. No hydronephrosis identified bilaterally. Upper pole left kidney cyst measures 1.8 cm. Stomach/Bowel: Visualized portions within the abdomen are unremarkable. Vascular/Lymphatic: Aortic atherosclerosis. No aneurysm portal vein and splenic vein remain patent. No abdominal adenopathy. Other: Trace free fluid along the pericolic gutters. No discrete, drainable fluid collection identified. Musculoskeletal: No suspicious bone lesions identified. IMPRESSION: 1. No significant change in size of centrally necrotic mass in segment 5 of the liver compatible with known hepatocellular carcinoma. Currently measures 4.5 by 3.5 cm. Previously measured 4.3 x 3.6 cm. 2. Interval progression acute pancreatitis with increased edema and surrounding fluid. No pseudo cyst or signs of pancreatic necrosis. 3. Increase in caliber of the CBD and main pancreatic duct. The distal common bile duct is filled with high T1, low T2 debris/sludge and possibly small stones, new from previous exam. 4. Morphologic features of cirrhosis. 5. Trace free fluid along the pericolic gutters. Electronically Signed   By: TKerby MoorsM.D.   On: 04/24/2020 08:21   MR 3D Recon At Scanner  Result Date: 04/17/2020 CLINICAL  DATA:  Prior gallstone pancreatitis, hepatocellular carcinoma. Abdominal pain. Choledocholithiasis on CT scan yesterday. EXAM: MRI ABDOMEN WITHOUT AND WITH CONTRAST (INCLUDING MRCP) TECHNIQUE: Multiplanar multisequence MR imaging of the abdomen was performed both before and after the administration of intravenous contrast. Heavily T2-weighted images of the biliary and pancreatic ducts were obtained, and three-dimensional MRCP images were rendered by post processing. CONTRAST:  925mGADAVIST GADOBUTROL 1 MMOL/ML IV SOLN COMPARISON:  CT abdomen 04/16/2020 FINDINGS: Lower chest: Median sternotomy. Hepatobiliary: Hepatic cirrhosis. 4.3 by 2.9 by 3.6 cm  mass in segment 5 of the liver, mostly with central necrosis but with enhancing marginal thickness of up to about 0.7 cm and with some speckled potentially enhancing internal nodular elements. High T1 signal in the gallbladder. Suspected small gallstones in the gallbladder. On the 3D MRCP images, the common bile duct measures 0.5 cm in diameter (previously about 0.8 cm on yesterday CT scan) at although the dedicated MRCP images such image 33 of series 12 do not show filling defects, there is some questionable granularity in the distal CBD region for example on image 11 of series 14 which could conceivably indicate tiny residual calculi. However, these do not appear to be as prominent as the string of calculi shown on 04/16/2020 CT scan. Pancreas: Unremarkable where included. 0.4 cm cystic lesion in the junction of the pancreatic body and tail on image 14 series 14. No findings of pancreatic abscess, necrosis, or pseudocyst. Subtle peripancreatic edema. Spleen:  Unremarkable Adrenals/Urinary Tract: Simple appearing left kidney upper pole cyst. Adrenal glands unremarkable. Stomach/Bowel: Unremarkable Vascular/Lymphatic:  Aortoiliac atherosclerotic vascular disease. Other:  Trace fluid in the right paracolic gutter. Musculoskeletal: Unremarkable IMPRESSION: 1. Low-grade  peripancreatic edema may be a manifestation of prior pancreatitis. 2. Most if not all of the prior distal CBD stones appear to of cleared. On the MRCP images the distal CBD appears normal and there is no more extrahepatic biliary dilatation, although on some of the other coronal sequences there is a subtle granularity in the distal CBD which could represent residual tiny stone(s) but substantially smaller and less prominent than on recent CT. 3. Small gallstones in the gallbladder. 4. Centrally necrotic mass in segment 5 of the liver. 5. Hepatic cirrhosis. 6.  Aortic Atherosclerosis (ICD10-I70.0). 7. Trace fluid in the right paracolic gutter. Electronically Signed   By: Van Clines M.D.   On: 04/17/2020 13:00   CT BONE MARROW BIOPSY & ASPIRATION  Result Date: 04/22/2020 CLINICAL DATA:  Thrombocytopenia. Possible ITP. History of hepatocellular carcinoma. EXAM: CT GUIDED BONE MARROW ASPIRATION AND BIOPSY ANESTHESIA/SEDATION: Versed 1.0 mg IV, Fentanyl 50 mcg IV Total Moderate Sedation Time:   15 minutes. The patient's level of consciousness and physiologic status were continuously monitored during the procedure by Radiology nursing. PROCEDURE: The procedure risks, benefits, and alternatives were explained to the patient. Questions regarding the procedure were encouraged and answered. The patient understands and consents to the procedure. A time out was performed prior to initiating the procedure. The right gluteal region was prepped with chlorhexidine. Sterile gown and sterile gloves were used for the procedure. Local anesthesia was provided with 1% Lidocaine. Under CT guidance, an 11 gauge On Control bone cutting needle was advanced from a posterior approach into the right iliac bone. Needle positioning was confirmed with CT. Initial non heparinized and heparinized aspirate samples were obtained of bone marrow. Core biopsy was performed via the On Control drill needle. COMPLICATIONS: None FINDINGS:  Inspection of initial aspirate did reveal visible particles. Intact core biopsy sample was obtained. IMPRESSION: CT guided bone marrow biopsy of right posterior iliac bone with both aspirate and core samples obtained. Electronically Signed   By: Aletta Edouard M.D.   On: 04/22/2020 11:26   DG C-Arm 1-60 Min-No Report  Result Date: 04/25/2020 Fluoroscopy was utilized by the requesting physician.  No radiographic interpretation.   MR ABDOMEN MRCP W WO CONTAST  Result Date: 04/24/2020 CLINICAL DATA:  Pancreatitis.  History of hepatocellular carcinoma. EXAM: MRI ABDOMEN WITHOUT AND WITH CONTRAST (INCLUDING MRCP) TECHNIQUE: Multiplanar multisequence MR  imaging of the abdomen was performed both before and after the administration of intravenous contrast. Heavily T2-weighted images of the biliary and pancreatic ducts were obtained, and three-dimensional MRCP images were rendered by post processing. CONTRAST:  51m GADAVIST GADOBUTROL 1 MMOL/ML IV SOLN COMPARISON:  04/17/2020. FINDINGS: Lower chest: No acute findings. Hepatobiliary: Morphologic features of cirrhosis are again noted involving the liver. The centrally necrotic mass in segment 5 of the liver with 7 mm of enhancing marginal thickness is again noted, image 29/17. On today's study this measures 4.5 by 3.5 cm, image 29/17. Previously 4.3 x 3.6 cm. High T1 signal within the gallbladder is again noted. On the MRCP images in the CBD measures 0.9 cm, image 6/14. This is compared with 5 mm on 04/17/2020. High T1 signal and low T2 signal sludge/debris (and possibly small stones) fill the distal common bile duct, image 14/3. Pancreas: There is diffuse pancreatic edema and peripancreatic fluid compatible with acute pancreatitis. This is increased when compared with the previous exam. Interval increase in caliber of the main pancreatic duct which measures 4 mm, image 22/8. Previously 2 mm. No discrete mature fluid collections identified to suggest pseudocyst. No  signs of pancreatic necrosis. Spleen: Spleen has a normal size measuring 10 cm in length. No focal splenic abnormality. Adrenals/Urinary Tract: Normal appearance of the adrenal glands. No hydronephrosis identified bilaterally. Upper pole left kidney cyst measures 1.8 cm. Stomach/Bowel: Visualized portions within the abdomen are unremarkable. Vascular/Lymphatic: Aortic atherosclerosis. No aneurysm portal vein and splenic vein remain patent. No abdominal adenopathy. Other: Trace free fluid along the pericolic gutters. No discrete, drainable fluid collection identified. Musculoskeletal: No suspicious bone lesions identified. IMPRESSION: 1. No significant change in size of centrally necrotic mass in segment 5 of the liver compatible with known hepatocellular carcinoma. Currently measures 4.5 by 3.5 cm. Previously measured 4.3 x 3.6 cm. 2. Interval progression acute pancreatitis with increased edema and surrounding fluid. No pseudo cyst or signs of pancreatic necrosis. 3. Increase in caliber of the CBD and main pancreatic duct. The distal common bile duct is filled with high T1, low T2 debris/sludge and possibly small stones, new from previous exam. 4. Morphologic features of cirrhosis. 5. Trace free fluid along the pericolic gutters. Electronically Signed   By: TKerby MoorsM.D.   On: 04/24/2020 08:21   MR ABDOMEN MRCP W WO CONTAST  Result Date: 04/17/2020 CLINICAL DATA:  Prior gallstone pancreatitis, hepatocellular carcinoma. Abdominal pain. Choledocholithiasis on CT scan yesterday. EXAM: MRI ABDOMEN WITHOUT AND WITH CONTRAST (INCLUDING MRCP) TECHNIQUE: Multiplanar multisequence MR imaging of the abdomen was performed both before and after the administration of intravenous contrast. Heavily T2-weighted images of the biliary and pancreatic ducts were obtained, and three-dimensional MRCP images were rendered by post processing. CONTRAST:  971mGADAVIST GADOBUTROL 1 MMOL/ML IV SOLN COMPARISON:  CT abdomen 04/16/2020  FINDINGS: Lower chest: Median sternotomy. Hepatobiliary: Hepatic cirrhosis. 4.3 by 2.9 by 3.6 cm mass in segment 5 of the liver, mostly with central necrosis but with enhancing marginal thickness of up to about 0.7 cm and with some speckled potentially enhancing internal nodular elements. High T1 signal in the gallbladder. Suspected small gallstones in the gallbladder. On the 3D MRCP images, the common bile duct measures 0.5 cm in diameter (previously about 0.8 cm on yesterday CT scan) at although the dedicated MRCP images such image 33 of series 12 do not show filling defects, there is some questionable granularity in the distal CBD region for example on image 11 of series 14  which could conceivably indicate tiny residual calculi. However, these do not appear to be as prominent as the string of calculi shown on 04/16/2020 CT scan. Pancreas: Unremarkable where included. 0.4 cm cystic lesion in the junction of the pancreatic body and tail on image 14 series 14. No findings of pancreatic abscess, necrosis, or pseudocyst. Subtle peripancreatic edema. Spleen:  Unremarkable Adrenals/Urinary Tract: Simple appearing left kidney upper pole cyst. Adrenal glands unremarkable. Stomach/Bowel: Unremarkable Vascular/Lymphatic:  Aortoiliac atherosclerotic vascular disease. Other:  Trace fluid in the right paracolic gutter. Musculoskeletal: Unremarkable IMPRESSION: 1. Low-grade peripancreatic edema may be a manifestation of prior pancreatitis. 2. Most if not all of the prior distal CBD stones appear to of cleared. On the MRCP images the distal CBD appears normal and there is no more extrahepatic biliary dilatation, although on some of the other coronal sequences there is a subtle granularity in the distal CBD which could represent residual tiny stone(s) but substantially smaller and less prominent than on recent CT. 3. Small gallstones in the gallbladder. 4. Centrally necrotic mass in segment 5 of the liver. 5. Hepatic cirrhosis.  6.  Aortic Atherosclerosis (ICD10-I70.0). 7. Trace fluid in the right paracolic gutter. Electronically Signed   By: Van Clines M.D.   On: 04/17/2020 13:00   US ABDOMEN LIMITED RUQ (LIVER/GB)  Result Date: 04/24/2020 CLINICAL DATA:  Epigastric pain EXAM: ULTRASOUND ABDOMEN LIMITED RIGHT UPPER QUADRANT COMPARISON:  MRI abdomen 04/17/2020 FINDINGS: Gallbladder: There are sludge and stones in the gallbladder. A negative sonographic Percell Miller sign was reported by the sonographer. No pericholecystic fluid. Mild wall thickening. Common bile duct: Diameter: 6 mm Liver: Redemonstration of mass within the right hepatic lobe, predominantly hyperechoic. This is better characterized on recent MRI. Nodular liver contour. Portal vein is patent on color Doppler imaging with normal direction of blood flow towards the liver. Other: None. IMPRESSION: 1. Cholelithiasis and mild gallbladder wall thickening. No other evidence of acute cholecystitis. 2. Hepatic cirrhosis and right hepatic lobe mass, better characterized on recent abdominal MRI. Electronically Signed   By: Ulyses Jarred M.D.   On: 04/24/2020 02:54     Assessment and plan- Patient is a 74 y.o. male with history of hepatocellular carcinoma s/p thermal ablation and worsening pancytopenia in the setting of MDS here to discuss further management  1.  MDS: Since March 2022 patient has had worsening pancytopenia.  Bone marrow biopsy done on 04/22/2020 showed features consistent with MDSWith no excess blasts.  Bone marrow hypercellular for age with dyspoietic changes involving erythroid and megakaryocyte excel lines associated with ringed sideroblasts.  MDS FISH panel also showed deletion 5 q. along with deletion 7 q.  Cytogenetics results were unreliable since only 4 metaphase cells were analyzed instead of usual 20.  In the absence of cytogenetics it would not be possible to accurately grade his MDS but based on his pancytopenia he at least has intermediate grade  MDS which translates into an overall survival of 3 years.  Given that he has deletion 5 q, I would recommend starting Revlimid 10 mg daily.  Discussed risks and benefits of Revlimid including all but not limited toFatigue, rash, diarrhea, leg swelling and possible risk of thromboembolic episodes.  Given that his platelet counts are low at 33 I will hold off on starting aspirin for thromboprophylaxis but consider starting it once platelet counts improved to more than 50.  If his platelet counts dropped down to less than 30 I would consider lowering his Revlimid dose to 5 mg daily.  Also if his ANC drops down to less than 500 I will consider putting him on Levaquin prophylaxis given his increased risk of infections.  His hemoglobin is 6.2 today and we will plan for 1 unit of blood transfusion tomorrow.  I will also check EPO levels today and if they are less than 500 I will consider adding Retacrit to his regimen along with Revlimid.  We will also check intelligent myeloid panel today  Given that he also had ringed sideroblasts on his bone marrow Luspatercept would also be an option down the line especially if he does not have a good response to Revlimid.  2.  Hepatocellular carcinoma: AFP from today is pending.  Overall recent MRI showed stable size of his primary liver lesion which we will continue to monitor.  He is s/p proton beam radiation in the past  3.  Gallstone pancreatitis: He is s/p ERCP and sphincterotomy.  Bilirubin levels are decreasing.  He is not a candidate for cholecystectomy as the gallbladder abuts the primary liver mass.  Repeat CBC with differential in 1 week in 2 weeks and I will see him back in 2 weeks   Visit Diagnosis 1. MDS (myelodysplastic syndrome) with 5q deletion ([place])   2. Symptomatic anemia   3. Hepatocellular carcinoma ([place])      Dr. Randa Evens, MD, MPH [facility] at [facility] [phone] 05/12/2020 3:40 PM

## 2020-05-12 NOTE — Telephone Encounter (Signed)
Oral Oncology Patient Advocate Encounter  Received notification from [facility] that prior authorization for Revlimid is required.  PA submitted on CoverMyMeds Key [id number] Status is pending  Oral Oncology Clinic will continue to follow.  [place] Patient [place] Phone [phone] Fax [phone] 05/12/2020 1:47 PM

## 2020-05-12 NOTE — Telephone Encounter (Signed)
Oral Oncology Patient Advocate Encounter  Prior Authorization for Revlimid has been approved.    PA# [id number] Effective dates: 01/05/20 through 01/03/21  Patients co-pay is $3703.93.  Oral Oncology Clinic will continue to follow.   [place] Patient [place] Phone [phone] Fax [phone] 05/12/2020 1:54 PM

## 2020-05-12 NOTE — Progress Notes (Signed)
[place]  [phone] [phone]  Patient Care Team: Danelle Berry, NP as PCP - General (Nurse Practitioner) Dionisio David, MD (Cardiology) Lequita Asal, MD as Referring Physician (Hematology and Oncology)   Name of the patient: Timothy Murray  [id number]  1945-05-31   Date of visit: 05/12/20  HPI: Patient is a 75 y.o. male with MDS positive for deletion 5q. Planned treatment with Revlimid (lenalidomide).  Reason for Consult: Revlimid (lenalidomide) oral chemotherapy education.   PAST MEDICAL HISTORY: Past Medical History:  Diagnosis Date  . Arthritis    "lower back" (10/24/2012)  . Coronary artery disease   . GERD (gastroesophageal reflux disease)   . High cholesterol   . Hypertension   . Liver cancer ([place])   . Type II diabetes mellitus [facility])     HEMATOLOGY/ONCOLOGY HISTORY:  Oncology History   No history exists.    ALLERGIES:  is allergic to other, soap & cleansers, and latex.  MEDICATIONS:  Current Outpatient Medications  Medication Sig Dispense Refill  . atorvastatin (LIPITOR) 40 MG tablet Take 1 tablet by mouth daily.    Marland Kitchen glimepiride (AMARYL) 1 MG tablet Take 1 mg by mouth daily.    Marland Kitchen glucosamine-chondroitin 500-400 MG tablet Take 1 tablet by mouth daily.    Marland Kitchen losartan (COZAAR) 25 MG tablet Take 25 mg by mouth daily.    . metoprolol tartrate (LOPRESSOR) 25 MG tablet Take 25 mg by mouth daily.    Marland Kitchen omeprazole (PRILOSEC) 20 MG capsule Take 40 mg by mouth every morning.     No current facility-administered medications for this visit.    VITAL SIGNS: There were no vitals taken for this visit. There were no vitals filed for this visit.  Estimated body mass index is 27.38 kg/m as calculated from the following:   Height as of 04/29/20: 6' (1.829 m).   Weight as of an earlier encounter on 05/12/20: 91.6 kg (201 lb 14.4 oz).  LABS: CBC:    Component Value Date/Time   WBC 1.1  (LL) 05/12/2020 1028   HGB 6.2 (L) 05/12/2020 1028   HGB 15.6 10/24/2012 1224   HCT 17.6 (L) 05/12/2020 1028   HCT 43.8 10/24/2012 1224   PLT 33 (L) 05/12/2020 1028   PLT 165 10/24/2012 1224   MCV 102.3 (H) 05/12/2020 1028   MCV 91 10/24/2012 1224   NEUTROABS 0.6 (L) 05/12/2020 1028   NEUTROABS 4.8 10/24/2012 1224   LYMPHSABS 0.4 (L) 05/12/2020 1028   LYMPHSABS 1.8 10/24/2012 1224   MONOABS 0.1 05/12/2020 1028   MONOABS 0.5 10/24/2012 1224   EOSABS 0.0 05/12/2020 1028   EOSABS 0.1 10/24/2012 1224   BASOSABS 0.0 05/12/2020 1028   BASOSABS 0.1 10/24/2012 1224   Comprehensive Metabolic Panel:    Component Value Date/Time   NA 137 05/12/2020 1028   NA 138 10/24/2012 1224   K 4.1 05/12/2020 1028   K 3.9 10/24/2012 1224   CL 102 05/12/2020 1028   CL 106 10/24/2012 1224   CO2 24 05/12/2020 1028   CO2 27 10/24/2012 1224   BUN 17 05/12/2020 1028   BUN 15 10/24/2012 1224   CREATININE 1.06 05/12/2020 1028   CREATININE 1.01 10/24/2012 1224   GLUCOSE 212 (H) 05/12/2020 1028   GLUCOSE 115 (H) 10/24/2012 1224   CALCIUM 8.5 (L) 05/12/2020 1028   CALCIUM 8.9 10/24/2012 1224   AST 22 05/12/2020 1028   AST 29 10/24/2012 1224   ALT 18 05/12/2020 1028  ALT 43 10/24/2012 1224   ALKPHOS 67 05/12/2020 1028   ALKPHOS 60 10/24/2012 1224   BILITOT 2.4 (H) 05/12/2020 1028   BILITOT 0.7 10/24/2012 1224   PROT 6.3 (L) 05/12/2020 1028   PROT 7.4 10/24/2012 1224   ALBUMIN 3.8 05/12/2020 1028   ALBUMIN 3.7 10/24/2012 1224     Present during today's visit: Timothy Murray and his wife Timothy Murray  Assessment and Plan: Start plan: Timothy Murray will get started on his Revlimid when it is in hand  Planned duration until disease progression or unacceptable drug toxicity.  CMP from 05/12/20 assessed, no relevant lab abnormalities. CBC from 05/12/20 assessed, ANC and pltc low. Monitor, if [place] <0.5K/uL or pltc <33  K/uL consider dose reducing to lenalidomide 5 mg. Prescription dose and frequency assessed.   Due  to currently low pltc, Dr. Janese Banks would like to hold off on starting aspirin 81mg  Will reconsider once pltc has improved.  Current medication list in Epic reviewed, no DDIs with lenalidomide identified.  Patient Education  Administration: Counseled patient on administration, dosing, side effects, monitoring, drug-food interactions, safe handling, storage, and disposal. Patient will take 1 capsule (10mg ) by mouth daily.  Side Effects: Side effects include but not limited to: rash, diarrhea or constipation, N/V, fatigue, decreased wbc/hgb/plt.    Adherence: After discussion with patient no patient barriers to medication adherence identified.  Reviewed with patient importance of keeping a medication schedule and plan for any missed doses.  The [place] voiced understanding and appreciation. All questions answered. Medication handout provided.  Provided patient with [facility] phone number. Patient knows to call the office with questions or concerns. Oral Chemotherapy Navigation Clinic will continue to follow.  Patient expressed understanding and was in agreement with this plan. He also understands that He can call clinic at any time with any questions, concerns, or complaints.   Medication Access Issues: Prescription sent to [facility], insurance approval and copay grant obtained.  Follow-up plan:   Thank you for allowing me to participate in the care of this patient.   Time Total: 40 mins  Visit consisted of counseling and education on dealing with issues of symptom management in the setting of serious and potentially life-threatening illness.Greater than 50%  of this time was spent counseling and coordinating care related to the above assessment and plan.  Signed by: Darl Pikes, PharmD, BCPS, Salley Slaughter, CPP Hematology/Oncology Clinical Pharmacist Practitioner [facility] [phone]  05/12/2020 12:50 PM

## 2020-05-12 NOTE — Progress Notes (Signed)
Patient here for oncology follow-up appointment, concerns of shortness of breath, fatigue, decreased appetite, and feet swelling

## 2020-05-12 NOTE — Telephone Encounter (Signed)
Oral Oncology Patient Advocate Encounter  Was successful in securing patient a $10,000 grant from [facility] to provide copayment coverage for Revlimid.  This will keep the out of pocket expense at $0.     [employer] ID: [id number]    I have spoken with the patient.   The billing information is as follows and has been shared with [facility].    RxBin: [id number] PCN: [id number] Member ID: [id number] Group ID: [id number] Dates of Eligibility: 04/12/20 through 04/11/21  Fund:  [place] Patient [place] Phone [phone] Fax [phone] 05/12/2020 2:36 PM

## 2020-05-13 ENCOUNTER — Inpatient Hospital Stay: Payer: Medicare HMO

## 2020-05-13 ENCOUNTER — Other Ambulatory Visit: Payer: Self-pay | Admitting: *Deleted

## 2020-05-13 DIAGNOSIS — D46C Myelodysplastic syndrome with isolated del(5q) chromosomal abnormality: Secondary | ICD-10-CM

## 2020-05-13 DIAGNOSIS — C220 Liver cell carcinoma: Secondary | ICD-10-CM | POA: Diagnosis not present

## 2020-05-13 DIAGNOSIS — Z5111 Encounter for antineoplastic chemotherapy: Secondary | ICD-10-CM | POA: Diagnosis not present

## 2020-05-13 DIAGNOSIS — R5383 Other fatigue: Secondary | ICD-10-CM | POA: Diagnosis not present

## 2020-05-13 DIAGNOSIS — M545 Low back pain, unspecified: Secondary | ICD-10-CM | POA: Diagnosis not present

## 2020-05-13 DIAGNOSIS — K851 Biliary acute pancreatitis without necrosis or infection: Secondary | ICD-10-CM | POA: Diagnosis not present

## 2020-05-13 DIAGNOSIS — K7460 Unspecified cirrhosis of liver: Secondary | ICD-10-CM | POA: Diagnosis not present

## 2020-05-13 DIAGNOSIS — D649 Anemia, unspecified: Secondary | ICD-10-CM

## 2020-05-13 LAB — ERYTHROPOIETIN: Erythropoietin: 82.9 m[IU]/mL — ABNORMAL HIGH (ref 2.6–18.5)

## 2020-05-13 LAB — AFP TUMOR MARKER: AFP, Serum, Tumor Marker: 2.7 ng/mL (ref 0.0–8.4)

## 2020-05-13 MED ORDER — SODIUM CHLORIDE 0.9% FLUSH
10.0000 mL | INTRAVENOUS | Status: DC | PRN
  Filled 2020-05-13: qty 10

## 2020-05-13 MED ORDER — ACETAMINOPHEN 325 MG PO TABS
650.0000 mg | ORAL_TABLET | Freq: Once | ORAL | Status: AC
  Administered 2020-05-13: 650 mg via ORAL
  Filled 2020-05-13: qty 2

## 2020-05-13 MED ORDER — SODIUM CHLORIDE 0.9% IV SOLUTION
250.0000 mL | Freq: Once | INTRAVENOUS | Status: AC
  Administered 2020-05-13: 250 mL via INTRAVENOUS
  Filled 2020-05-13: qty 250

## 2020-05-14 DIAGNOSIS — R600 Localized edema: Secondary | ICD-10-CM | POA: Diagnosis not present

## 2020-05-14 DIAGNOSIS — I2510 Atherosclerotic heart disease of native coronary artery without angina pectoris: Secondary | ICD-10-CM | POA: Diagnosis not present

## 2020-05-14 DIAGNOSIS — I10 Essential (primary) hypertension: Secondary | ICD-10-CM | POA: Diagnosis not present

## 2020-05-14 DIAGNOSIS — Q061 Hypoplasia and dysplasia of spinal cord: Secondary | ICD-10-CM | POA: Diagnosis not present

## 2020-05-14 DIAGNOSIS — E138 Other specified diabetes mellitus with unspecified complications: Secondary | ICD-10-CM | POA: Diagnosis not present

## 2020-05-14 LAB — TYPE AND SCREEN
ABO/RH(D): A POS
Antibody Screen: NEGATIVE
Unit division: 0

## 2020-05-14 LAB — BPAM RBC
Blood Product Expiration Date: 202205202359
ISSUE DATE / TIME: 202205101319
Unit Type and Rh: 5100

## 2020-05-15 DIAGNOSIS — I2510 Atherosclerotic heart disease of native coronary artery without angina pectoris: Secondary | ICD-10-CM | POA: Diagnosis not present

## 2020-05-15 DIAGNOSIS — Q061 Hypoplasia and dysplasia of spinal cord: Secondary | ICD-10-CM | POA: Diagnosis not present

## 2020-05-15 DIAGNOSIS — R600 Localized edema: Secondary | ICD-10-CM | POA: Diagnosis not present

## 2020-05-15 DIAGNOSIS — I10 Essential (primary) hypertension: Secondary | ICD-10-CM | POA: Diagnosis not present

## 2020-05-15 DIAGNOSIS — E138 Other specified diabetes mellitus with unspecified complications: Secondary | ICD-10-CM | POA: Diagnosis not present

## 2020-05-16 DIAGNOSIS — I25810 Atherosclerosis of coronary artery bypass graft(s) without angina pectoris: Secondary | ICD-10-CM | POA: Diagnosis not present

## 2020-05-16 DIAGNOSIS — I10 Essential (primary) hypertension: Secondary | ICD-10-CM | POA: Diagnosis not present

## 2020-05-16 DIAGNOSIS — E138 Other specified diabetes mellitus with unspecified complications: Secondary | ICD-10-CM | POA: Diagnosis not present

## 2020-05-16 DIAGNOSIS — E785 Hyperlipidemia, unspecified: Secondary | ICD-10-CM | POA: Diagnosis not present

## 2020-05-16 DIAGNOSIS — I2510 Atherosclerotic heart disease of native coronary artery without angina pectoris: Secondary | ICD-10-CM | POA: Diagnosis not present

## 2020-05-19 ENCOUNTER — Telehealth: Payer: Self-pay | Admitting: Pharmacist

## 2020-05-19 LAB — INTELLIGEN MYELOID

## 2020-05-19 NOTE — Telephone Encounter (Signed)
Oral Chemotherapy Pharmacist Encounter   [name] called and LVM stating that he was having a some itching since starting his lenalidomide. I called [name] to ask some additional question. He started hi lenalidomide about 3 days ago. The itching is on his head and started yesterday. He reports constant itching. His wife checked while we were on the phone and she did not see any redness.   Instructed [name] to get started on an antihistamine, loratadine 10mg , cetirizine 10mg  or diphenhydramine 25mg . He has a lab and infusion appt tomorrow. I added on an appt with me during infusion to check on his itching. Dr. Janese Banks was informed. She would like [name] to take a slightly lower dose than normal due to his liver cirrhosis. I will instruct [name] to switch to half a tablet during our appt tomorrow.   Of note, patient also has elevated t.bili and this could also be a cause of the itching.  Darl Pikes, PharmD, BCPS, BCOP, CPP Hematology/Oncology Clinical Pharmacist [facility] [phone]  05/19/2020 10:47 AM

## 2020-05-20 ENCOUNTER — Inpatient Hospital Stay: Payer: Medicare HMO

## 2020-05-20 ENCOUNTER — Ambulatory Visit: Payer: Medicare HMO

## 2020-05-20 ENCOUNTER — Other Ambulatory Visit: Payer: Medicare HMO

## 2020-05-20 ENCOUNTER — Other Ambulatory Visit: Payer: Self-pay | Admitting: Oncology

## 2020-05-20 ENCOUNTER — Inpatient Hospital Stay: Payer: Medicare HMO | Admitting: Pharmacist

## 2020-05-20 DIAGNOSIS — K851 Biliary acute pancreatitis without necrosis or infection: Secondary | ICD-10-CM | POA: Diagnosis not present

## 2020-05-20 DIAGNOSIS — D649 Anemia, unspecified: Secondary | ICD-10-CM

## 2020-05-20 DIAGNOSIS — K7460 Unspecified cirrhosis of liver: Secondary | ICD-10-CM | POA: Diagnosis not present

## 2020-05-20 DIAGNOSIS — D46C Myelodysplastic syndrome with isolated del(5q) chromosomal abnormality: Secondary | ICD-10-CM

## 2020-05-20 DIAGNOSIS — Z5111 Encounter for antineoplastic chemotherapy: Secondary | ICD-10-CM | POA: Diagnosis not present

## 2020-05-20 DIAGNOSIS — C220 Liver cell carcinoma: Secondary | ICD-10-CM | POA: Diagnosis not present

## 2020-05-20 DIAGNOSIS — M545 Low back pain, unspecified: Secondary | ICD-10-CM | POA: Diagnosis not present

## 2020-05-20 DIAGNOSIS — R5383 Other fatigue: Secondary | ICD-10-CM | POA: Diagnosis not present

## 2020-05-20 LAB — COMPREHENSIVE METABOLIC PANEL
ALT: 32 U/L (ref 0–44)
AST: 29 U/L (ref 15–41)
Albumin: 3.6 g/dL (ref 3.5–5.0)
Alkaline Phosphatase: 68 U/L (ref 38–126)
Anion gap: 9 (ref 5–15)
BUN: 12 mg/dL (ref 8–23)
CO2: 25 mmol/L (ref 22–32)
Calcium: 8.2 mg/dL — ABNORMAL LOW (ref 8.9–10.3)
Chloride: 104 mmol/L (ref 98–111)
Creatinine, Ser: 0.98 mg/dL (ref 0.61–1.24)
GFR, Estimated: >60 mL/min (ref >60)
Glucose, Bld: 181 mg/dL — ABNORMAL HIGH (ref 70–99)
Potassium: 3.5 mmol/L (ref 3.5–5.1)
Sodium: 138 mmol/L (ref 135–145)
Total Bilirubin: 2 mg/dL — ABNORMAL HIGH (ref 0.3–1.2)
Total Protein: 6.1 g/dL — ABNORMAL LOW (ref 6.5–8.1)

## 2020-05-20 LAB — CBC WITH DIFFERENTIAL/PLATELET
Abs Immature Granulocytes: 0.02 10*3/uL (ref 0.00–0.07)
Basophils Absolute: 0 10*3/uL (ref 0.0–0.1)
Basophils Relative: 1 %
Eosinophils Absolute: 0 10*3/uL (ref 0.0–0.5)
Eosinophils Relative: 1 %
HCT: 18.5 % — ABNORMAL LOW (ref 39.0–52.0)
Hemoglobin: 6.4 g/dL — ABNORMAL LOW (ref 13.0–17.0)
Immature Granulocytes: 2 %
Lymphocytes Relative: 32 %
Lymphs Abs: 0.3 10*3/uL — ABNORMAL LOW (ref 0.7–4.0)
MCH: 35.4 pg — ABNORMAL HIGH (ref 26.0–34.0)
MCHC: 34.6 g/dL (ref 30.0–36.0)
MCV: 102.2 fL — ABNORMAL HIGH (ref 80.0–100.0)
Monocytes Absolute: 0.1 10*3/uL (ref 0.1–1.0)
Monocytes Relative: 12 %
Neutro Abs: 0.5 10*3/uL — ABNORMAL LOW (ref 1.7–7.7)
Neutrophils Relative %: 52 %
Platelets: 26 10*3/uL — CL (ref 150–400)
RBC: 1.81 MIL/uL — ABNORMAL LOW (ref 4.22–5.81)
RDW: 18.5 % — ABNORMAL HIGH (ref 11.5–15.5)
Smear Review: NORMAL
WBC: 1 10*3/uL — CL (ref 4.0–10.5)
nRBC: 0 % (ref 0.0–0.2)

## 2020-05-20 LAB — SAMPLE TO BLOOD BANK

## 2020-05-20 LAB — PREPARE RBC (CROSSMATCH)

## 2020-05-20 MED ORDER — SODIUM CHLORIDE 0.9% IV SOLUTION
250.0000 mL | Freq: Once | INTRAVENOUS | Status: AC
Start: 2020-05-20 — End: 2020-05-20
  Administered 2020-05-20: 250 mL via INTRAVENOUS
  Filled 2020-05-20: qty 250

## 2020-05-20 MED ORDER — LENALIDOMIDE 5 MG PO CAPS: 5.0000 mg | ORAL_CAPSULE | Freq: Every day | ORAL | 0 refills | Status: DC

## 2020-05-20 MED ORDER — SODIUM CHLORIDE 0.9% FLUSH
3.0000 mL | INTRAVENOUS | Status: DC | PRN
  Filled 2020-05-20: qty 3

## 2020-05-20 MED ORDER — ACETAMINOPHEN 325 MG PO TABS
650.0000 mg | ORAL_TABLET | Freq: Once | ORAL | Status: AC
Start: 2020-05-20 — End: 2020-05-20
  Administered 2020-05-20: 650 mg via ORAL
  Filled 2020-05-20: qty 2

## 2020-05-20 NOTE — Progress Notes (Signed)
[place]  [phone] [phone]  Patient Care Team: Danelle Berry, NP as PCP - General (Nurse Practitioner) Dionisio David, MD (Cardiology) Lequita Asal, MD as Referring Physician (Hematology and Oncology)   Name of the patient: Timothy Murray  [id number]  [birth year]   Date of visit: 05/20/20  HPI: Patient is a 75 y.o. male with MDS positive for deletion 5q. Currently treated with Revlimid (lenalidomide).  Reason for Consult: Oral chemotherapy follow-up for Revlimid (lenalidomide) therapy.   PAST MEDICAL HISTORY: Past Medical History:  Diagnosis Date  . Arthritis    "lower back" (10/24/2012)  . Coronary artery disease   . GERD (gastroesophageal reflux disease)   . High cholesterol   . Hypertension   . Liver cancer ([place])   . Type II diabetes mellitus [facility])     HEMATOLOGY/ONCOLOGY HISTORY:  Oncology History   No history exists.    ALLERGIES:  is allergic to other, soap & cleansers, and latex.  MEDICATIONS:  Current Outpatient Medications  Medication Sig Dispense Refill  . lenalidomide (REVLIMID) 5 MG capsule Take 1 capsule (5 mg total) by mouth daily. 28 capsule 0  . atorvastatin (LIPITOR) 40 MG tablet Take 1 tablet by mouth daily.    Marland Kitchen glimepiride (AMARYL) 1 MG tablet Take 1 mg by mouth daily.    Marland Kitchen glucosamine-chondroitin 500-400 MG tablet Take 1 tablet by mouth daily.    Marland Kitchen losartan (COZAAR) 25 MG tablet Take 25 mg by mouth daily.    . metoprolol tartrate (LOPRESSOR) 25 MG tablet Take 25 mg by mouth daily.    Marland Kitchen omeprazole (PRILOSEC) 20 MG capsule Take 40 mg by mouth every morning.     No current facility-administered medications for this visit.   Facility-Administered Medications Ordered in Other Visits  Medication Dose Route Frequency Provider Last Rate Last Admin  . sodium chloride flush (NS) 0.9 % injection 3 mL  3 mL Intracatheter PRN Sindy Guadeloupe, MD        VITAL  SIGNS: There were no vitals taken for this visit. There were no vitals filed for this visit.  Estimated body mass index is 27.38 kg/m as calculated from the following:   Height as of 04/29/20: 6' (1.829 m).   Weight as of 05/12/20: 91.6 kg (201 lb 14.4 oz).  LABS: CBC:    Component Value Date/Time   WBC 1.0 (LL) 05/20/2020 0840   HGB 6.4 (L) 05/20/2020 0840   HGB 15.6 10/24/2012 1224   HCT 18.5 (L) 05/20/2020 0840   HCT 43.8 10/24/2012 1224   PLT 26 (LL) 05/20/2020 0840   PLT 165 10/24/2012 1224   MCV 102.2 (H) 05/20/2020 0840   MCV 91 10/24/2012 1224   NEUTROABS 0.5 (L) 05/20/2020 0840   NEUTROABS 4.8 10/24/2012 1224   LYMPHSABS 0.3 (L) 05/20/2020 0840   LYMPHSABS 1.8 10/24/2012 1224   MONOABS 0.1 05/20/2020 0840   MONOABS 0.5 10/24/2012 1224   EOSABS 0.0 05/20/2020 0840   EOSABS 0.1 10/24/2012 1224   BASOSABS 0.0 05/20/2020 0840   BASOSABS 0.1 10/24/2012 1224   Comprehensive Metabolic Panel:    Component Value Date/Time   NA 138 05/20/2020 0840   NA 138 10/24/2012 1224   K 3.5 05/20/2020 0840   K 3.9 10/24/2012 1224   CL 104 05/20/2020 0840   CL 106 10/24/2012 1224   CO2 25 05/20/2020 0840   CO2 27 10/24/2012 1224   BUN 12 05/20/2020 0840   BUN 15  10/24/2012 1224   CREATININE 0.98 05/20/2020 0840   CREATININE 1.01 10/24/2012 1224   GLUCOSE 181 (H) 05/20/2020 0840   GLUCOSE 115 (H) 10/24/2012 1224   CALCIUM 8.2 (L) 05/20/2020 0840   CALCIUM 8.9 10/24/2012 1224   AST 29 05/20/2020 0840   AST 29 10/24/2012 1224   ALT 32 05/20/2020 0840   ALT 43 10/24/2012 1224   ALKPHOS 68 05/20/2020 0840   ALKPHOS 60 10/24/2012 1224   BILITOT 2.0 (H) 05/20/2020 0840   BILITOT 0.7 10/24/2012 1224   PROT 6.1 (L) 05/20/2020 0840   PROT 7.4 10/24/2012 1224   ALBUMIN 3.6 05/20/2020 0840   ALBUMIN 3.7 10/24/2012 1224     Present during today's visit: Timothy Murray, seen in infusion  Assessment and Plan: Itching: Timothy Murray called yesterday to report scalp itching with no  redness. He started taking loratadine 10mg  as instructed yesterday. He has noticed some improvement in the itching today.  - Examined his scalp today, no redness, his scalp appeared dry - When asked about itching prior to starting lenalidomide, Timothy Murray reported no previous scalp itching but he has has prior itching and rash in other area. He typically used OTC creams to help in those areas. - Continue loratadine daily, but Dr. Janese Banks would like to reduced his dose due to his liver cirrhosis. Instructed patient to take half a tablet (5mg ) daily.  Dose adjustment: Due to plt count dropping below 30 K/uL, Dr. Janese Banks would like to dose decrease Revlimid to 5mg  daily. Prescription for 5mg  sent today to [facility]. Timothy Murray was instructed to keep taking his 10mg  capsules until the 5 mg capsules arrive then switch to the 5 mg capsules. Timothy Murray stated his understanding of the plan.   Oral Chemotherapy Side Effect/Intolerance:  - No reported nausea. Normal bowel movements, reviewed hat to do in the case of diarrhea or constipation. - Reported leg edema, encouraged leg elevation and compression stockings.  Oral Chemotherapy Adherence: no reported missed dosed No patient barriers to medication adherence identified.   New medications: none reported  Medication Access Issues: none, Timothy Murray knows to look out for a call from [facility] for deilvery of the 5mg  capsules  Patient expressed understanding and was in agreement with this plan. He also understands that He can call clinic at any time with any questions, concerns, or complaints.   Thank you for allowing me to participate in the care of this very pleasant patient.   Time Total: 15 mins  Visit consisted of counseling and education on dealing with issues of symptom management in the setting of serious and potentially life-threatening illness.Greater than 50%  of this time was spent counseling and coordinating care related to the above assessment  and plan.  Signed by: Darl Pikes, PharmD, BCPS, Salley Slaughter, CPP Hematology/Oncology Clinical Pharmacist Practitioner [facility] [phone]  05/20/2020 10:24 AM

## 2020-05-20 NOTE — Patient Instructions (Signed)
[facility] [place] [facility]  Discharge Instructions: Thank you for choosing [facility] to provide your oncology and hematology care.  If you have a lab appointment with the Cancer Center, please go directly to the Cancer Center and check in at the registration area.  Wear comfortable clothing and clothing appropriate for easy access to any Portacath or PICC line.   We strive to give you quality time with your provider. You may need to reschedule your appointment if you arrive late (15 or more minutes).  Arriving late affects you and other patients whose appointments are after yours.  Also, if you miss three or more appointments without notifying the office, you may be dismissed from the clinic at the provider's discretion.      For prescription refill requests, have your pharmacy contact our office and allow 72 hours for refills to be completed.    Today you received the following : Blood transfusion   To help prevent nausea and vomiting after your treatment, we encourage you to take your nausea medication as directed.  BELOW ARE SYMPTOMS THAT SHOULD BE REPORTED IMMEDIATELY: *FEVER GREATER THAN 100.4 F (38 C) OR HIGHER *CHILLS OR SWEATING *NAUSEA AND VOMITING THAT IS NOT CONTROLLED WITH YOUR NAUSEA MEDICATION *UNUSUAL SHORTNESS OF BREATH *UNUSUAL BRUISING OR BLEEDING *URINARY PROBLEMS (pain or burning when urinating, or frequent urination) *BOWEL PROBLEMS (unusual diarrhea, constipation, pain near the anus) TENDERNESS IN MOUTH AND THROAT WITH OR WITHOUT PRESENCE OF ULCERS (sore throat, sores in mouth, or a toothache) UNUSUAL RASH, SWELLING OR PAIN  UNUSUAL VAGINAL DISCHARGE OR ITCHING   Items with * indicate a potential emergency and should be followed up as soon as possible or go to the Emergency Department if any problems should occur.  Please show the CHEMOTHERAPY ALERT CARD or IMMUNOTHERAPY ALERT CARD at check-in to the Emergency Department and  triage nurse.  Should you have questions after your visit or need to cancel or reschedule your appointment, please contact CANCER CENTER [facility]  [phone] and follow the prompts.  Office hours are 8:00 a.m. to 4:30 p.m. Monday - Friday. Please note that voicemails left after 4:00 p.m. may not be returned until the following business day.  We are closed weekends and major holidays. You have access to a nurse at all times for urgent questions. Please call the main number to the clinic [phone] and follow the prompts.  For any non-urgent questions, you may also contact your provider using MyChart. We now offer e-Visits for anyone 18 and older to request care online for non-urgent symptoms. For details visit [email or web address].   Also download the MyChart app! Go to the app store, search "MyChart", open the app, select [facility], and log in with your MyChart username and password.  Due to Covid, a mask is required upon entering the hospital/clinic. If you do not have a mask, one will be given to you upon arrival. For doctor visits, patients may have 1 support person aged 18 or older with them. For treatment visits, patients cannot have anyone with them due to current Covid guidelines and our immunocompromised population.  

## 2020-05-21 DIAGNOSIS — I70213 Atherosclerosis of native arteries of extremities with intermittent claudication, bilateral legs: Secondary | ICD-10-CM | POA: Diagnosis not present

## 2020-05-21 LAB — TYPE AND SCREEN
ABO/RH(D): A POS
Antibody Screen: NEGATIVE
Unit division: 0

## 2020-05-21 LAB — BPAM RBC
Blood Product Expiration Date: 202206082359
ISSUE DATE / TIME: 202205171137
Unit Type and Rh: 6200

## 2020-05-23 DIAGNOSIS — R0602 Shortness of breath: Secondary | ICD-10-CM | POA: Diagnosis not present

## 2020-05-23 DIAGNOSIS — I7100 Dissection of unspecified site of aorta: Secondary | ICD-10-CM | POA: Diagnosis not present

## 2020-05-23 LAB — SURGICAL PATHOLOGY

## 2020-05-26 ENCOUNTER — Telehealth: Payer: Self-pay | Admitting: *Deleted

## 2020-05-26 ENCOUNTER — Inpatient Hospital Stay: Payer: Medicare HMO

## 2020-05-26 ENCOUNTER — Other Ambulatory Visit: Payer: Self-pay | Admitting: *Deleted

## 2020-05-26 ENCOUNTER — Encounter: Payer: Self-pay | Admitting: Oncology

## 2020-05-26 ENCOUNTER — Inpatient Hospital Stay (HOSPITAL_BASED_OUTPATIENT_CLINIC_OR_DEPARTMENT_OTHER): Payer: Medicare HMO | Admitting: Oncology

## 2020-05-26 VITALS — BP 109/55 | HR 60 | Temp 96.6°F | Resp 20 | Wt 203.0 lb

## 2020-05-26 DIAGNOSIS — Z7189 Other specified counseling: Secondary | ICD-10-CM

## 2020-05-26 DIAGNOSIS — D649 Anemia, unspecified: Secondary | ICD-10-CM

## 2020-05-26 DIAGNOSIS — D61818 Other pancytopenia: Secondary | ICD-10-CM

## 2020-05-26 DIAGNOSIS — Z515 Encounter for palliative care: Secondary | ICD-10-CM | POA: Insufficient documentation

## 2020-05-26 DIAGNOSIS — M545 Low back pain, unspecified: Secondary | ICD-10-CM | POA: Diagnosis not present

## 2020-05-26 DIAGNOSIS — C220 Liver cell carcinoma: Secondary | ICD-10-CM | POA: Diagnosis not present

## 2020-05-26 DIAGNOSIS — D46C Myelodysplastic syndrome with isolated del(5q) chromosomal abnormality: Secondary | ICD-10-CM

## 2020-05-26 DIAGNOSIS — K7460 Unspecified cirrhosis of liver: Secondary | ICD-10-CM | POA: Diagnosis not present

## 2020-05-26 DIAGNOSIS — Z5111 Encounter for antineoplastic chemotherapy: Secondary | ICD-10-CM | POA: Diagnosis not present

## 2020-05-26 DIAGNOSIS — D469 Myelodysplastic syndrome, unspecified: Secondary | ICD-10-CM

## 2020-05-26 DIAGNOSIS — R5383 Other fatigue: Secondary | ICD-10-CM | POA: Diagnosis not present

## 2020-05-26 DIAGNOSIS — K851 Biliary acute pancreatitis without necrosis or infection: Secondary | ICD-10-CM | POA: Diagnosis not present

## 2020-05-26 LAB — CBC WITH DIFFERENTIAL/PLATELET
Abs Immature Granulocytes: 0 10*3/uL (ref 0.00–0.07)
Basophils Absolute: 0 10*3/uL (ref 0.0–0.1)
Basophils Relative: 1 %
Eosinophils Absolute: 0 10*3/uL (ref 0.0–0.5)
Eosinophils Relative: 1 %
HCT: 20.6 % — ABNORMAL LOW (ref 39.0–52.0)
Hemoglobin: 7 g/dL — ABNORMAL LOW (ref 13.0–17.0)
Immature Granulocytes: 0 %
Lymphocytes Relative: 37 %
Lymphs Abs: 0.4 10*3/uL — ABNORMAL LOW (ref 0.7–4.0)
MCH: 34.5 pg — ABNORMAL HIGH (ref 26.0–34.0)
MCHC: 34 g/dL (ref 30.0–36.0)
MCV: 101.5 fL — ABNORMAL HIGH (ref 80.0–100.0)
Monocytes Absolute: 0.2 10*3/uL (ref 0.1–1.0)
Monocytes Relative: 16 %
Neutro Abs: 0.4 10*3/uL — CL (ref 1.7–7.7)
Neutrophils Relative %: 45 %
Platelets: 26 10*3/uL — CL (ref 150–400)
RBC: 2.03 MIL/uL — ABNORMAL LOW (ref 4.22–5.81)
RDW: 17.9 % — ABNORMAL HIGH (ref 11.5–15.5)
Smear Review: NORMAL
WBC: 1 10*3/uL — CL (ref 4.0–10.5)
nRBC: 0 % (ref 0.0–0.2)

## 2020-05-26 LAB — COMPREHENSIVE METABOLIC PANEL
ALT: 33 U/L (ref 0–44)
AST: 26 U/L (ref 15–41)
Albumin: 3.7 g/dL (ref 3.5–5.0)
Alkaline Phosphatase: 71 U/L (ref 38–126)
Anion gap: 10 (ref 5–15)
BUN: 10 mg/dL (ref 8–23)
CO2: 26 mmol/L (ref 22–32)
Calcium: 8.2 mg/dL — ABNORMAL LOW (ref 8.9–10.3)
Chloride: 102 mmol/L (ref 98–111)
Creatinine, Ser: 0.8 mg/dL (ref 0.61–1.24)
GFR, Estimated: >60 mL/min (ref >60)
Glucose, Bld: 161 mg/dL — ABNORMAL HIGH (ref 70–99)
Potassium: 3.4 mmol/L — ABNORMAL LOW (ref 3.5–5.1)
Sodium: 138 mmol/L (ref 135–145)
Total Bilirubin: 2 mg/dL — ABNORMAL HIGH (ref 0.3–1.2)
Total Protein: 6.2 g/dL — ABNORMAL LOW (ref 6.5–8.1)

## 2020-05-26 LAB — PREPARE RBC (CROSSMATCH)

## 2020-05-26 MED ORDER — LEVOFLOXACIN 500 MG PO TABS: 500.0000 mg | ORAL_TABLET | Freq: Every day | ORAL | 0 refills | Status: DC

## 2020-05-26 MED ORDER — SODIUM CHLORIDE 0.9% IV SOLUTION
250.0000 mL | Freq: Once | INTRAVENOUS | Status: AC
Start: 2020-05-26 — End: 2020-05-26
  Administered 2020-05-26: 250 mL via INTRAVENOUS
  Filled 2020-05-26: qty 250

## 2020-05-26 MED ORDER — LIDOCAINE-PRILOCAINE 2.5-2.5 % EX CREA: TOPICAL_CREAM | CUTANEOUS | 3 refills | Status: DC

## 2020-05-26 MED ORDER — ONDANSETRON HCL 8 MG PO TABS: 8.0000 mg | ORAL_TABLET | Freq: Two times a day (BID) | ORAL | 1 refills | Status: DC | PRN

## 2020-05-26 MED ORDER — LORAZEPAM 0.5 MG PO TABS: 0.5000 mg | ORAL_TABLET | Freq: Four times a day (QID) | ORAL | 0 refills | Status: DC | PRN

## 2020-05-26 MED ORDER — ACETAMINOPHEN 325 MG PO TABS
650.0000 mg | ORAL_TABLET | Freq: Once | ORAL | Status: AC
Start: 2020-05-26 — End: 2020-05-26
  Administered 2020-05-26: 650 mg via ORAL
  Filled 2020-05-26: qty 2

## 2020-05-26 MED ORDER — PROCHLORPERAZINE MALEATE 10 MG PO TABS: 10.0000 mg | ORAL_TABLET | Freq: Four times a day (QID) | ORAL | 1 refills | Status: DC | PRN

## 2020-05-26 MED ORDER — OXYCODONE HCL 5 MG PO TABS: 5.0000 mg | ORAL_TABLET | Freq: Three times a day (TID) | ORAL | 0 refills | Status: DC | PRN

## 2020-05-26 MED ORDER — ACYCLOVIR 400 MG PO TABS: 400.0000 mg | ORAL_TABLET | Freq: Two times a day (BID) | ORAL | 1 refills | Status: DC

## 2020-05-26 NOTE — Progress Notes (Signed)
START ON PATHWAY REGIMEN - MDS     A cycle is every 28 days:     Azacitidine   **Always confirm dose/schedule in your pharmacy ordering system**  Patient Characteristics: Higher-Risk (IPSS-R Score > 3.5), First Line, Not a Transplant Candidate WHO Disease Classification: MDS-MLD Bone Marrow Blasts (percent): ? 2% Cytogenetic Category: Poor Platelets (x 10^9/L): < 50 Absolute Neutrophil Count (x 10^9/L): < 0.8 Line of Therapy: First Line IPSS-R Risk Category: High IPSS-R Risk Score: 6 Check here if patient's risk score was calculated prior to the International Prognostic Scoring System-Revised (IPSS-R): false Hemoglobin (g/dl): < 8 Patient Characteristics: Not a Transplant Candidate Intent of Therapy: Non-Curative / Palliative Intent, Discussed with Patient

## 2020-05-26 NOTE — Telephone Encounter (Signed)
Called pt to let him know that dr Janese Banks has asked me to call levaquin which is antibiotic to protect him from infection while he is on vidaza. Also while on that drug there is a possibility of having shingles and therefore we have sent acyclovir  To his pharmacy for protection from him starting the [place]. [facility] [place] [facility]. I sent it there. The pt. Will check with pharmacy to see that he gets it. Also he spoke about nausea meds. And they were sent to [facility] in [place] and wanted to know what he should take. It is for nausea all the 3 meds. She ordered. The ativan usually makes people sleepy so would use at night time or when he would not go out for a long time until he tries it and knows how it would affect him. If he does get nauseated he can try either the zofran or the compazine and see which one works best for him. Some people like one or the other so it is ok to start with which one he would like to try when he gets nauseated.

## 2020-05-26 NOTE — Patient Instructions (Signed)

## 2020-05-26 NOTE — Progress Notes (Signed)
Hematology/Oncology Consult note [facility]  Telephone:([phone] Fax:([phone]  Patient Care Team: Danelle Berry, NP as PCP - General (Nurse Practitioner) Dionisio David, MD (Cardiology) Lequita Asal, MD as Referring Physician (Hematology and Oncology)   Name of the patient: Timothy Murray  [id number]  14-Jan-1945   Date of visit: 05/26/20  Diagnosis- MDS with 5q del but high risk given TP53 mutation HCC s/p thermal ablation  Chief complaint/ Reason for visit-routine follow-up of MDS  Heme/Onc history: Patient is a 75 year old male with a history of cirrhosis and hepatocellular carcinoma for which she had proton beam radiation In June 2021.  He has baseline thrombocytopenia which was initially attributed to his cirrhosis.  He was found to have progressive pancytopenia with a steady decline in his white cell count since March 2022 when it was down to 3.4 with a platelet count of 53.  He was hospitalized sometime in April 2022 with acute biliary gallstone pancreatitis .  He underwent ERCP inpatient with platelet transfusion which showed sludge and biliary sphincterotomy was performed.  His bilirubin which was elevated up to 7.2 has come down to 2.4 presently.  However his pancytopenia has continued to worsen and presently white count is 1.1 with an H&H of 6.2/17.6 with an MCV of 102 and a platelet count of 33.Bone marrow biopsy showed evidence of dyspoietic changes in the erythroid and megakaryocyte excel lines associated with ring sideroblasts.  Definite increase in blastic cells not identified.  Overall features favor myelodysplastic syndrome.  Karyotype was normal but only for metaphase cells were analyzed which were not sufficient.  [employer] FISH MDS panel showed deletion of 5 q. along with monosomy 7 and deletion 7 q. without excess blasts.  SF3B1 mutation negative.  T p53 positive  With regards to his hepatocellular carcinoma patient  had an MRI abdomen on 04/24/2020 which showed that his primary liver mass which was previously 4.3 x 3.6 cmWas similar in size at 4.5 x 3.5 cm.   Interval history-patient was on Revlimid 5 mg daily for 5 q. deletion for the last 10 days.  He has been tolerating it well without any significant side effects patient has baseline fatigue and exertional shortness of breath as well as bilateral leg edema which is unchanged.  He does report some low back pain for which he uses Advil  ECOG PS- 2 Pain scale- 3   Review of systems- Review of Systems  Constitutional: Positive for malaise/fatigue. Negative for chills, fever and weight loss.  HENT: Negative for congestion, ear discharge and nosebleeds.   Eyes: Negative for blurred vision.  Respiratory: Positive for shortness of breath. Negative for cough, hemoptysis, sputum production and wheezing.   Cardiovascular: Positive for leg swelling. Negative for chest pain, palpitations, orthopnea and claudication.  Gastrointestinal: Negative for abdominal pain, blood in stool, constipation, diarrhea, heartburn, melena, nausea and vomiting.  Genitourinary: Negative for dysuria, flank pain, frequency, hematuria and urgency.  Musculoskeletal: Positive for back pain. Negative for joint pain and myalgias.  Skin: Negative for rash.  Neurological: Negative for dizziness, tingling, focal weakness, seizures, weakness and headaches.  Endo/Heme/Allergies: Does not bruise/bleed easily.  Psychiatric/Behavioral: Negative for depression and suicidal ideas. The patient does not have insomnia.       Allergies  Allergen Reactions  . Other Nausea And Vomiting  . Soap & Cleansers     "liquid soap" antibacterial -rash  . Latex Rash and Itching     Past Medical History:  Diagnosis  Date  . Arthritis    "lower back" (10/24/2012)  . Coronary artery disease   . GERD (gastroesophageal reflux disease)   . High cholesterol   . Hypertension   . Liver cancer ([place])   . Type  II diabetes mellitus ([place])      Past Surgical History:  Procedure Laterality Date  . APPENDECTOMY  2002  . CARDIAC CATHETERIZATION  10/24/2012  . CORONARY ARTERY BYPASS GRAFT N/A 10/26/2012   Procedure: CORONARY ARTERY BYPASS GRAFTING (CABG);  Surgeon: Melrose Nakayama, MD;  Location: [facility];  Service: Open Heart Surgery;  Laterality: N/A;  Times 3 using left internal mammary artery and endoscopically harvested right saphenous vein  . ERCP N/A 04/25/2020   Procedure: ENDOSCOPIC RETROGRADE CHOLANGIOPANCREATOGRAPHY (ERCP);  Surgeon: Lucilla Lame, MD;  Location: [facility] ENDOSCOPY;  Service: Endoscopy;  Laterality: N/A;    Social History   Socioeconomic History  . Marital status: Married    Spouse name: Not on file  . Number of children: Not on file  . Years of education: Not on file  . Highest education level: Not on file  Occupational History  . Not on file  Tobacco Use  . Smoking status: Former Smoker    Packs/day: 0.50    Years: 30.00    Pack years: 15.00    Types: Cigarettes    Quit date: 08/28/2001    Years since quitting: 18.7  . Smokeless tobacco: Never Used  Vaping Use  . Vaping Use: Not on file  Substance and Sexual Activity  . Alcohol use: No    Alcohol/week: 0.0 standard drinks    Comment: 10/24/2012 "stopped drinking alcohol in 2003; used to drink ~ 9 bottles beer/wk"  . Drug use: No  . Sexual activity: Yes  Other Topics Concern  . Not on file  Social History Narrative  . Not on file   Social Determinants of Health   Financial Resource Strain: Not on file  Food Insecurity: Not on file  Transportation Needs: Not on file  Physical Activity: Not on file  Stress: Not on file  Social Connections: Not on file  Intimate Partner Violence: Not on file    Family History  Problem Relation Age of Onset  . Cancer Sister      Current Outpatient Medications:  .  atorvastatin (LIPITOR) 40 MG tablet, Take 1 tablet by mouth daily., Disp: , Rfl:  .  glimepiride  (AMARYL) 1 MG tablet, Take 1 mg by mouth daily., Disp: , Rfl:  .  glucosamine-chondroitin 500-400 MG tablet, Take 1 tablet by mouth daily., Disp: , Rfl:  .  lenalidomide (REVLIMID) 5 MG capsule, Take 1 capsule (5 mg total) by mouth daily., Disp: 28 capsule, Rfl: 0 .  loratadine (CLARITIN) 10 MG tablet, Take 5 mg by mouth daily. Take half a tablet (7m) daily, Disp: , Rfl:  .  losartan (COZAAR) 25 MG tablet, Take 25 mg by mouth daily., Disp: , Rfl:  .  metoprolol tartrate (LOPRESSOR) 25 MG tablet, Take 25 mg by mouth daily., Disp: , Rfl:  .  omeprazole (PRILOSEC) 20 MG capsule, Take 40 mg by mouth every morning., Disp: , Rfl:  .  lidocaine-prilocaine (EMLA) cream, Apply to affected area once, Disp: 30 g, Rfl: 3 .  LORazepam (ATIVAN) 0.5 MG tablet, Take 1 tablet (0.5 mg total) by mouth every 6 (six) hours as needed (Nausea or vomiting)., Disp: 30 tablet, Rfl: 0 .  ondansetron (ZOFRAN) 8 MG tablet, Take 1 tablet (8 mg total) by mouth  2 (two) times daily as needed (Nausea or vomiting)., Disp: 30 tablet, Rfl: 1 .  prochlorperazine (COMPAZINE) 10 MG tablet, Take 1 tablet (10 mg total) by mouth every 6 (six) hours as needed (Nausea or vomiting)., Disp: 30 tablet, Rfl: 1  Physical exam:  Vitals:   05/26/20 0919  BP: (!) 109/55  Pulse: 60  Resp: 20  Temp: (!) 96.6 F (35.9 C)  TempSrc: Tympanic  Weight: 203 lb (92.1 kg)   Physical Exam Constitutional:      General: He is not in acute distress.    Comments: Sitting in a wheelchair.  Appears fatigued  Cardiovascular:     Rate and Rhythm: Normal rate and regular rhythm.     Heart sounds: Normal heart sounds.  Pulmonary:     Effort: Pulmonary effort is normal.     Breath sounds: Normal breath sounds.  Musculoskeletal:     Right lower leg: Edema present.     Left lower leg: Edema present.  Skin:    General: Skin is warm and dry.  Neurological:     Mental Status: He is alert and oriented to person, place, and time.      CMP Latest Ref Rng  & Units 05/26/2020  Glucose 70 - 99 mg/dL 161(H)  BUN 8 - 23 mg/dL 10  Creatinine 0.61 - 1.24 mg/dL 0.80  Sodium 135 - 145 mmol/L 138  Potassium 3.5 - 5.1 mmol/L 3.4(L)  Chloride 98 - 111 mmol/L 102  CO2 22 - 32 mmol/L 26  Calcium 8.9 - 10.3 mg/dL 8.2(L)  Total Protein 6.5 - 8.1 g/dL 6.2(L)  Total Bilirubin 0.3 - 1.2 mg/dL 2.0(H)  Alkaline Phos 38 - 126 U/L 71  AST 15 - 41 U/L 26  ALT 0 - 44 U/L 33   CBC Latest Ref Rng & Units 05/26/2020  WBC 4.0 - 10.5 K/uL 1.0(LL)  Hemoglobin 13.0 - 17.0 g/dL 7.0(L)  Hematocrit 39.0 - 52.0 % 20.6(L)  Platelets 150 - 400 K/uL 26(LL)       No results found.   Assessment and plan- Patient is a 75 y.o. male with high risk MDS and T p53 mutation here for routine follow-up  Patient was started on Revlimid for 5 q. deletion about 10 days ago.  However [employer] also shows T p53 mutation which makes this a high risk MDS.  Revlimid single agent unlikely to result in any significant improvement in his counts.  I would therefore recommend proceeding with Vidaza 75 mg per metered square day 1 to day 5 q. 28 days until progression or toxicity.  Discussed risks and benefits of Vidaza including all but not limited to nausea vomiting low blood counts, risk of infections and hospitalizations.  Treatment will be given with a palliative intent.  Patient understands and agrees to proceed as planned.  We will plan to start [place] tomorrow and he will receive fifth dose next week.vidaza is associated with 17% CR and PR after a median of 6 cycles.  Overall median survival with high risk MDS is between 8 to 14 months of Vidaza was shown to have a survival benefit in all prognostic subgroups and age groups including those greater than 8 years of age.  Patient will no longer take his Revlimid  Patient continues to be transfusion dependent with PRBC transfusions once a week and he will proceed with transfusion today. Platelet counts if less than 15 would qualify him for a  platelet transfusion as well.  Patient's ANC is low at  400.  Given the risk of infections associated with Vidaza would like to put him on acyclovir and Levaquin prophylaxis  Low back pain: Nonmalignant.  I do not want him to take Advil and I will therefore give him a prescription for oxycodone 5 mg every 8 hours as needed  He will be seen each week for labs and possible transfusion by covering NP.  I will see him back in 4 weeks for cycle 2 of Vidaza.  I will hold off on starting venetoclax along with Vidaza his baseline significant pancytopenia and borderline performance status but could be considered down the line  HCC: S/p thermal ablation.  Continue to monitor with scans in the future     Visit Diagnosis 1. MDS (myelodysplastic syndrome) ([place])   2. Encounter for antineoplastic chemotherapy   3. Goals of care, counseling/discussion   4. Symptomatic anemia   5. Other pancytopenia ([place])      Dr. Randa Evens, MD, MPH [facility] [phone] 05/26/2020 1:42 PM

## 2020-05-27 ENCOUNTER — Inpatient Hospital Stay: Payer: Medicare HMO

## 2020-05-27 ENCOUNTER — Other Ambulatory Visit: Payer: Self-pay | Admitting: Oncology

## 2020-05-27 VITALS — BP 121/62 | HR 73 | Temp 99.4°F | Resp 16

## 2020-05-27 DIAGNOSIS — I2510 Atherosclerotic heart disease of native coronary artery without angina pectoris: Secondary | ICD-10-CM | POA: Diagnosis not present

## 2020-05-27 DIAGNOSIS — K851 Biliary acute pancreatitis without necrosis or infection: Secondary | ICD-10-CM | POA: Diagnosis not present

## 2020-05-27 DIAGNOSIS — R5383 Other fatigue: Secondary | ICD-10-CM | POA: Diagnosis not present

## 2020-05-27 DIAGNOSIS — D469 Myelodysplastic syndrome, unspecified: Secondary | ICD-10-CM

## 2020-05-27 DIAGNOSIS — R0602 Shortness of breath: Secondary | ICD-10-CM | POA: Diagnosis not present

## 2020-05-27 DIAGNOSIS — I10 Essential (primary) hypertension: Secondary | ICD-10-CM | POA: Diagnosis not present

## 2020-05-27 DIAGNOSIS — C220 Liver cell carcinoma: Secondary | ICD-10-CM | POA: Diagnosis not present

## 2020-05-27 DIAGNOSIS — K7460 Unspecified cirrhosis of liver: Secondary | ICD-10-CM | POA: Diagnosis not present

## 2020-05-27 DIAGNOSIS — Z5111 Encounter for antineoplastic chemotherapy: Secondary | ICD-10-CM | POA: Diagnosis not present

## 2020-05-27 DIAGNOSIS — D46C Myelodysplastic syndrome with isolated del(5q) chromosomal abnormality: Secondary | ICD-10-CM | POA: Diagnosis not present

## 2020-05-27 DIAGNOSIS — E785 Hyperlipidemia, unspecified: Secondary | ICD-10-CM | POA: Diagnosis not present

## 2020-05-27 DIAGNOSIS — M545 Low back pain, unspecified: Secondary | ICD-10-CM | POA: Diagnosis not present

## 2020-05-27 LAB — BPAM RBC
Blood Product Expiration Date: 202206132359
ISSUE DATE / TIME: 202205231231
Unit Type and Rh: 6200

## 2020-05-27 LAB — TYPE AND SCREEN
ABO/RH(D): A POS
Antibody Screen: NEGATIVE
Unit division: 0

## 2020-05-27 LAB — SAMPLE TO BLOOD BANK

## 2020-05-27 MED ORDER — ONDANSETRON HCL 4 MG PO TABS
8.0000 mg | ORAL_TABLET | Freq: Once | ORAL | Status: AC
  Administered 2020-05-27: 8 mg via ORAL
  Filled 2020-05-27: qty 2

## 2020-05-27 MED ORDER — AZACITIDINE CHEMO SQ INJECTION
75.0000 mg/m2 | Freq: Once | INTRAMUSCULAR | Status: AC
  Administered 2020-05-27: 162.5 mg via SUBCUTANEOUS
  Filled 2020-05-27: qty 6.5

## 2020-05-27 NOTE — Patient Instructions (Signed)
[place] ONCOLOGY  Discharge Instructions: Thank you for choosing [facility] to provide your oncology and hematology care.  If you have a lab appointment with the [facility], please go directly to the [facility] and check in at the registration area.  Wear comfortable clothing and clothing appropriate for easy access to any Portacath or PICC line.   We strive to give you quality time with your provider. You may need to reschedule your appointment if you arrive late (15 or more minutes).  Arriving late affects you and other patients whose appointments are after yours.  Also, if you miss three or more appointments without notifying the office, you may be dismissed from the clinic at the provider's discretion.      For prescription refill requests, have your pharmacy contact our office and allow 72 hours for refills to be completed.    Today you received the following chemotherapy and/or immunotherapy agents Vidaza  Azacitidine suspension for injection (subcutaneous use) What is this medicine? AZACITIDINE (ay [place]) is a chemotherapy drug. This medicine reduces the growth of cancer cells and can suppress the immune system. It is used for treating myelodysplastic syndrome or some types of leukemia. This medicine may be used for other purposes; ask your health care provider or pharmacist if you have questions. COMMON BRAND NAME(S): Vidaza What should I tell my health care provider before I take this medicine? They need to know if you have any of these conditions:  kidney disease  liver disease  liver tumors  an unusual or allergic reaction to azacitidine, mannitol, other medicines, foods, dyes, or preservatives  pregnant or trying to get pregnant  breast-feeding How should I use this medicine? This medicine is for injection under the skin. It is administered in a hospital or clinic by a specially trained health care  professional. Talk to your pediatrician regarding the use of this medicine in children. While this drug may be prescribed for selected conditions, precautions do apply. Overdosage: If you think you have taken too much of this medicine contact a poison control center or emergency room at once. NOTE: This medicine is only for you. Do not share this medicine with others. What if I miss a dose? It is important not to miss your dose. Call your doctor or health care professional if you are unable to keep an appointment. What may interact with this medicine? Interactions have not been studied. Give your health care provider a list of all the medicines, herbs, non-prescription drugs, or dietary supplements you use. Also tell them if you smoke, drink alcohol, or use illegal drugs. Some items may interact with your medicine. This list may not describe all possible interactions. Give your health care provider a list of all the medicines, herbs, non-prescription drugs, or dietary supplements you use. Also tell them if you smoke, drink alcohol, or use illegal drugs. Some items may interact with your medicine. What should I watch for while using this medicine? Visit your doctor for checks on your progress. This drug may make you feel generally unwell. This is not uncommon, as chemotherapy can affect healthy cells as well as cancer cells. Report any side effects. Continue your course of treatment even though you feel ill unless your doctor tells you to stop. In some cases, you may be given additional medicines to help with side effects. Follow all directions for their use. Call your doctor or health care professional for advice if you get  a fever, chills or sore throat, or other symptoms of a cold or flu. Do not treat yourself. This drug decreases your body's ability to fight infections. Try to avoid being around people who are sick. This medicine may increase your risk to bruise or bleed. Call your doctor or health  care professional if you notice any unusual bleeding. You may need blood work done while you are taking this medicine. Do not become pregnant while taking this medicine and for 6 months after the last dose. Women should inform their doctor if they wish to become pregnant or think they might be pregnant. Men should not father a child while taking this medicine and for 3 months after the last dose. There is a potential for serious side effects to an unborn child. Talk to your health care professional or pharmacist for more information. Do not breast-feed an infant while taking this medicine and for 1 week after the last dose. This medicine may interfere with the ability to have a child. Talk with your doctor or health care professional if you are concerned about your fertility. What side effects may I notice from receiving this medicine? Side effects that you should report to your doctor or health care professional as soon as possible:  allergic reactions like skin rash, itching or hives, swelling of the face, lips, or tongue  low blood counts - this medicine may decrease the number of white blood cells, red blood cells and platelets. You may be at increased risk for infections and bleeding.  signs of infection - fever or chills, cough, sore throat, pain passing urine  signs of decreased platelets or bleeding - bruising, pinpoint red spots on the skin, black, tarry stools, blood in the urine  signs of decreased red blood cells - unusually weak or tired, fainting spells, lightheadedness  signs and symptoms of kidney injury like trouble passing urine or change in the amount of urine  signs and symptoms of liver injury like dark yellow or brown urine; general ill feeling or flu-like symptoms; light-colored stools; loss of appetite; nausea; right upper belly pain; unusually weak or tired; yellowing of the eyes or skin Side effects that usually do not require medical attention (report to your doctor or  health care professional if they continue or are bothersome):  constipation  diarrhea  nausea, vomiting  pain or redness at the injection site  unusually weak or tired This list may not describe all possible side effects. Call your doctor for medical advice about side effects. You may report side effects to FDA at 1-800-FDA-1088. Where should I keep my medicine? This drug is given in a hospital or clinic and will not be stored at home. NOTE: This sheet is a summary. It may not cover all possible information. If you have questions about this medicine, talk to your doctor, pharmacist, or health care provider.  2021 Elsevier/Gold Standard (2016-01-20 14:37:51)       To help prevent nausea and vomiting after your treatment, we encourage you to take your nausea medication as directed.  BELOW ARE SYMPTOMS THAT SHOULD BE REPORTED IMMEDIATELY: . *FEVER GREATER THAN 100.4 F (38 C) OR HIGHER . *CHILLS OR SWEATING . *NAUSEA AND VOMITING THAT IS NOT CONTROLLED WITH YOUR NAUSEA MEDICATION . *UNUSUAL SHORTNESS OF BREATH . *UNUSUAL BRUISING OR BLEEDING . *URINARY PROBLEMS (pain or burning when urinating, or frequent urination) . *BOWEL PROBLEMS (unusual diarrhea, constipation, pain near the anus) . TENDERNESS IN MOUTH AND THROAT WITH OR WITHOUT PRESENCE OF  ULCERS (sore throat, sores in mouth, or a toothache) . UNUSUAL RASH, SWELLING OR PAIN  . UNUSUAL VAGINAL DISCHARGE OR ITCHING   Items with * indicate a potential emergency and should be followed up as soon as possible or go to the Emergency Department if any problems should occur.  Please show the CHEMOTHERAPY ALERT CARD or IMMUNOTHERAPY ALERT CARD at check-in to the Emergency Department and triage nurse.  Should you have questions after your visit or need to cancel or reschedule your appointment, please contact [facility]  [phone] and follow the prompts.  Office hours are 8:00 a.m. to 4:30 p.m.  Monday - Friday. Please note that voicemails left after 4:00 p.m. may not be returned until the following business day.  We are closed weekends and major holidays. You have access to a nurse at all times for urgent questions. Please call the main number to the clinic [phone] and follow the prompts.  For any non-urgent questions, you may also contact your provider using MyChart. We now offer e-Visits for anyone 70 and older to request care online for non-urgent symptoms. For details visit [email or web address].   Also download the MyChart app! Go to the app store, search "MyChart", open the app, select [place], and log in with your MyChart username and password.  Due to Covid, a mask is required upon entering the hospital/clinic. If you do not have a mask, one will be given to you upon arrival. For doctor visits, patients may have 1 support person aged 62 or older with them. For treatment visits, patients cannot have anyone with them due to current Covid guidelines and our immunocompromised population.

## 2020-05-28 ENCOUNTER — Inpatient Hospital Stay: Payer: Medicare HMO

## 2020-05-28 VITALS — BP 118/54 | HR 72 | Temp 98.1°F | Resp 16

## 2020-05-28 DIAGNOSIS — D46C Myelodysplastic syndrome with isolated del(5q) chromosomal abnormality: Secondary | ICD-10-CM | POA: Diagnosis not present

## 2020-05-28 DIAGNOSIS — D469 Myelodysplastic syndrome, unspecified: Secondary | ICD-10-CM

## 2020-05-28 DIAGNOSIS — C220 Liver cell carcinoma: Secondary | ICD-10-CM | POA: Diagnosis not present

## 2020-05-28 DIAGNOSIS — Z5111 Encounter for antineoplastic chemotherapy: Secondary | ICD-10-CM | POA: Diagnosis not present

## 2020-05-28 DIAGNOSIS — K851 Biliary acute pancreatitis without necrosis or infection: Secondary | ICD-10-CM | POA: Diagnosis not present

## 2020-05-28 DIAGNOSIS — M545 Low back pain, unspecified: Secondary | ICD-10-CM | POA: Diagnosis not present

## 2020-05-28 DIAGNOSIS — K7460 Unspecified cirrhosis of liver: Secondary | ICD-10-CM | POA: Diagnosis not present

## 2020-05-28 DIAGNOSIS — R5383 Other fatigue: Secondary | ICD-10-CM | POA: Diagnosis not present

## 2020-05-28 MED ORDER — AZACITIDINE CHEMO SQ INJECTION
75.0000 mg/m2 | Freq: Once | INTRAMUSCULAR | Status: AC
  Administered 2020-05-28: 162.5 mg via SUBCUTANEOUS
  Filled 2020-05-28: qty 6.5

## 2020-05-28 MED ORDER — ONDANSETRON HCL 4 MG PO TABS
8.0000 mg | ORAL_TABLET | Freq: Once | ORAL | Status: AC
  Administered 2020-05-28: 8 mg via ORAL
  Filled 2020-05-28: qty 2

## 2020-05-28 NOTE — Patient Instructions (Signed)
[facility] ONCOLOGY  Discharge Instructions: Thank you for choosing [facility] to provide your oncology and hematology care.  If you have a lab appointment with the [facility], please go directly to the [facility] and check in at the registration area.  Wear comfortable clothing and clothing appropriate for easy access to any Portacath or PICC line.   We strive to give you quality time with your provider. You may need to reschedule your appointment if you arrive late (15 or more minutes).  Arriving late affects you and other patients whose appointments are after yours.  Also, if you miss three or more appointments without notifying the office, you may be dismissed from the clinic at the provider's discretion.      For prescription refill requests, have your pharmacy contact our office and allow 72 hours for refills to be completed.    Today you received the following chemotherapy and/or immunotherapy agents : Vidaza    To help prevent nausea and vomiting after your treatment, we encourage you to take your nausea medication as directed.  BELOW ARE SYMPTOMS THAT SHOULD BE REPORTED IMMEDIATELY: . *FEVER GREATER THAN 100.4 F (38 C) OR HIGHER . *CHILLS OR SWEATING . *NAUSEA AND VOMITING THAT IS NOT CONTROLLED WITH YOUR NAUSEA MEDICATION . *UNUSUAL SHORTNESS OF BREATH . *UNUSUAL BRUISING OR BLEEDING . *URINARY PROBLEMS (pain or burning when urinating, or frequent urination) . *BOWEL PROBLEMS (unusual diarrhea, constipation, pain near the anus) . TENDERNESS IN MOUTH AND THROAT WITH OR WITHOUT PRESENCE OF ULCERS (sore throat, sores in mouth, or a toothache) . UNUSUAL RASH, SWELLING OR PAIN  . UNUSUAL VAGINAL DISCHARGE OR ITCHING   Items with * indicate a potential emergency and should be followed up as soon as possible or go to the Emergency Department if any problems should occur.  Please show the CHEMOTHERAPY ALERT CARD or IMMUNOTHERAPY ALERT  CARD at check-in to the Emergency Department and triage nurse.  Should you have questions after your visit or need to cancel or reschedule your appointment, please contact [facility]  [phone] and follow the prompts.  Office hours are 8:00 a.m. to 4:30 p.m. Monday - Friday. Please note that voicemails left after 4:00 p.m. may not be returned until the following business day.  We are closed weekends and major holidays. You have access to a nurse at all times for urgent questions. Please call the main number to the clinic [phone] and follow the prompts.  For any non-urgent questions, you may also contact your provider using MyChart. We now offer e-Visits for anyone 65 and older to request care online for non-urgent symptoms. For details visit [email or web address].   Also download the MyChart app! Go to the app store, search "MyChart", open the app, select [place], and log in with your MyChart username and password.  Due to Covid, a mask is required upon entering the hospital/clinic. If you do not have a mask, one will be given to you upon arrival. For doctor visits, patients may have 1 support person aged 41 or older with them. For treatment visits, patients cannot have anyone with them due to current Covid guidelines and our immunocompromised population.

## 2020-05-29 ENCOUNTER — Inpatient Hospital Stay: Payer: Medicare HMO

## 2020-05-29 VITALS — BP 107/47 | HR 71 | Temp 97.0°F | Resp 16

## 2020-05-29 DIAGNOSIS — D46C Myelodysplastic syndrome with isolated del(5q) chromosomal abnormality: Secondary | ICD-10-CM | POA: Diagnosis not present

## 2020-05-29 DIAGNOSIS — K851 Biliary acute pancreatitis without necrosis or infection: Secondary | ICD-10-CM | POA: Diagnosis not present

## 2020-05-29 DIAGNOSIS — M545 Low back pain, unspecified: Secondary | ICD-10-CM | POA: Diagnosis not present

## 2020-05-29 DIAGNOSIS — Z5111 Encounter for antineoplastic chemotherapy: Secondary | ICD-10-CM | POA: Diagnosis not present

## 2020-05-29 DIAGNOSIS — K7460 Unspecified cirrhosis of liver: Secondary | ICD-10-CM | POA: Diagnosis not present

## 2020-05-29 DIAGNOSIS — D469 Myelodysplastic syndrome, unspecified: Secondary | ICD-10-CM

## 2020-05-29 DIAGNOSIS — R5383 Other fatigue: Secondary | ICD-10-CM | POA: Diagnosis not present

## 2020-05-29 DIAGNOSIS — C220 Liver cell carcinoma: Secondary | ICD-10-CM | POA: Diagnosis not present

## 2020-05-29 MED ORDER — AZACITIDINE CHEMO SQ INJECTION
75.0000 mg/m2 | Freq: Once | INTRAMUSCULAR | Status: AC
  Administered 2020-05-29: 162.5 mg via SUBCUTANEOUS
  Filled 2020-05-29: qty 6.5

## 2020-05-29 MED ORDER — ONDANSETRON HCL 4 MG PO TABS
8.0000 mg | ORAL_TABLET | Freq: Once | ORAL | Status: AC
  Administered 2020-05-29: 8 mg via ORAL
  Filled 2020-05-29: qty 2

## 2020-05-29 NOTE — Patient Instructions (Signed)
Azacitidine suspension for injection (subcutaneous use) What is this medicine? AZACITIDINE (ay za SITE i deen) is a chemotherapy drug. This medicine reduces the growth of cancer cells and can suppress the immune system. It is used for treating myelodysplastic syndrome or some types of leukemia. This medicine may be used for other purposes; ask your health care provider or pharmacist if you have questions. COMMON BRAND NAME(S): Vidaza What should I tell my health care provider before I take this medicine? They need to know if you have any of these conditions:  kidney disease  liver disease  liver tumors  an unusual or allergic reaction to azacitidine, mannitol, other medicines, foods, dyes, or preservatives  pregnant or trying to get pregnant  breast-feeding How should I use this medicine? This medicine is for injection under the skin. It is administered in a hospital or clinic by a specially trained health care professional. Talk to your pediatrician regarding the use of this medicine in children. While this drug may be prescribed for selected conditions, precautions do apply. Overdosage: If you think you have taken too much of this medicine contact a poison control center or emergency room at once. NOTE: This medicine is only for you. Do not share this medicine with others. What if I miss a dose? It is important not to miss your dose. Call your doctor or health care professional if you are unable to keep an appointment. What may interact with this medicine? Interactions have not been studied. Give your health care provider a list of all the medicines, herbs, non-prescription drugs, or dietary supplements you use. Also tell them if you smoke, drink alcohol, or use illegal drugs. Some items may interact with your medicine. This list may not describe all possible interactions. Give your health care provider a list of all the medicines, herbs, non-prescription drugs, or dietary supplements  you use. Also tell them if you smoke, drink alcohol, or use illegal drugs. Some items may interact with your medicine. What should I watch for while using this medicine? Visit your doctor for checks on your progress. This drug may make you feel generally unwell. This is not uncommon, as chemotherapy can affect healthy cells as well as cancer cells. Report any side effects. Continue your course of treatment even though you feel ill unless your doctor tells you to stop. In some cases, you may be given additional medicines to help with side effects. Follow all directions for their use. Call your doctor or health care professional for advice if you get a fever, chills or sore throat, or other symptoms of a cold or flu. Do not treat yourself. This drug decreases your body's ability to fight infections. Try to avoid being around people who are sick. This medicine may increase your risk to bruise or bleed. Call your doctor or health care professional if you notice any unusual bleeding. You may need blood work done while you are taking this medicine. Do not become pregnant while taking this medicine and for 6 months after the last dose. Women should inform their doctor if they wish to become pregnant or think they might be pregnant. Men should not father a child while taking this medicine and for 3 months after the last dose. There is a potential for serious side effects to an unborn child. Talk to your health care professional or pharmacist for more information. Do not breast-feed an infant while taking this medicine and for 1 week after the last dose. This medicine may interfere with   the ability to have a child. Talk with your doctor or health care professional if you are concerned about your fertility. What side effects may I notice from receiving this medicine? Side effects that you should report to your doctor or health care professional as soon as possible:  allergic reactions like skin rash, itching or  hives, swelling of the face, lips, or tongue  low blood counts - this medicine may decrease the number of white blood cells, red blood cells and platelets. You may be at increased risk for infections and bleeding.  signs of infection - fever or chills, cough, sore throat, pain passing urine  signs of decreased platelets or bleeding - bruising, pinpoint red spots on the skin, black, tarry stools, blood in the urine  signs of decreased red blood cells - unusually weak or tired, fainting spells, lightheadedness  signs and symptoms of kidney injury like trouble passing urine or change in the amount of urine  signs and symptoms of liver injury like dark yellow or brown urine; general ill feeling or flu-like symptoms; light-colored stools; loss of appetite; nausea; right upper belly pain; unusually weak or tired; yellowing of the eyes or skin Side effects that usually do not require medical attention (report to your doctor or health care professional if they continue or are bothersome):  constipation  diarrhea  nausea, vomiting  pain or redness at the injection site  unusually weak or tired This list may not describe all possible side effects. Call your doctor for medical advice about side effects. You may report side effects to FDA at 1-800-FDA-1088. Where should I keep my medicine? This drug is given in a hospital or clinic and will not be stored at home. NOTE: This sheet is a summary. It may not cover all possible information. If you have questions about this medicine, talk to your doctor, pharmacist, or health care provider.  2021 [employer]vier/Gold Standard (2016-01-20 14:37:51)  

## 2020-05-30 ENCOUNTER — Other Ambulatory Visit: Payer: Self-pay

## 2020-05-30 ENCOUNTER — Inpatient Hospital Stay: Payer: Medicare HMO

## 2020-05-30 VITALS — BP 104/49 | HR 70 | Temp 97.0°F | Resp 16

## 2020-05-30 DIAGNOSIS — Z5111 Encounter for antineoplastic chemotherapy: Secondary | ICD-10-CM | POA: Diagnosis not present

## 2020-05-30 DIAGNOSIS — D469 Myelodysplastic syndrome, unspecified: Secondary | ICD-10-CM

## 2020-05-30 DIAGNOSIS — C220 Liver cell carcinoma: Secondary | ICD-10-CM | POA: Diagnosis not present

## 2020-05-30 DIAGNOSIS — K7460 Unspecified cirrhosis of liver: Secondary | ICD-10-CM | POA: Diagnosis not present

## 2020-05-30 DIAGNOSIS — R5383 Other fatigue: Secondary | ICD-10-CM | POA: Diagnosis not present

## 2020-05-30 DIAGNOSIS — K851 Biliary acute pancreatitis without necrosis or infection: Secondary | ICD-10-CM | POA: Diagnosis not present

## 2020-05-30 DIAGNOSIS — D46C Myelodysplastic syndrome with isolated del(5q) chromosomal abnormality: Secondary | ICD-10-CM | POA: Diagnosis not present

## 2020-05-30 DIAGNOSIS — M545 Low back pain, unspecified: Secondary | ICD-10-CM | POA: Diagnosis not present

## 2020-05-30 MED ORDER — AZACITIDINE CHEMO SQ INJECTION
75.0000 mg/m2 | Freq: Once | INTRAMUSCULAR | Status: AC
  Administered 2020-05-30: 162.5 mg via SUBCUTANEOUS
  Filled 2020-05-30: qty 6.5

## 2020-05-30 MED ORDER — ONDANSETRON HCL 4 MG PO TABS
8.0000 mg | ORAL_TABLET | Freq: Once | ORAL | Status: AC
  Administered 2020-05-30: 8 mg via ORAL
  Filled 2020-05-30: qty 2

## 2020-05-30 NOTE — Patient Instructions (Signed)
[facility]  Discharge Instructions: Thank you for choosing [facility] to provide your oncology and hematology care.  If you have a lab appointment with the [facility], please go directly to the [facility] and check in at the registration area.  Wear comfortable clothing and clothing appropriate for easy access to any Portacath or PICC line.   We strive to give you quality time with your provider. You may need to reschedule your appointment if you arrive late (15 or more minutes).  Arriving late affects you and other patients whose appointments are after yours.  Also, if you miss three or more appointments without notifying the office, you may be dismissed from the clinic at the provider's discretion.      For prescription refill requests, have your pharmacy contact our office and allow 72 hours for refills to be completed.    Today you received the following chemotherapy and/or immunotherapy agents - azacitazine      To help prevent nausea and vomiting after your treatment, we encourage you to take your nausea medication as directed.  BELOW ARE SYMPTOMS THAT SHOULD BE REPORTED IMMEDIATELY: . *FEVER GREATER THAN 100.4 F (38 C) OR HIGHER . *CHILLS OR SWEATING . *NAUSEA AND VOMITING THAT IS NOT CONTROLLED WITH YOUR NAUSEA MEDICATION . *UNUSUAL SHORTNESS OF BREATH . *UNUSUAL BRUISING OR BLEEDING . *URINARY PROBLEMS (pain or burning when urinating, or frequent urination) . *BOWEL PROBLEMS (unusual diarrhea, constipation, pain near the anus) . TENDERNESS IN MOUTH AND THROAT WITH OR WITHOUT PRESENCE OF ULCERS (sore throat, sores in mouth, or a toothache) . UNUSUAL RASH, SWELLING OR PAIN  . UNUSUAL VAGINAL DISCHARGE OR ITCHING   Items with * indicate a potential emergency and should be followed up as soon as possible or go to the Emergency Department if any problems should occur.  Please show the CHEMOTHERAPY ALERT CARD or IMMUNOTHERAPY  ALERT CARD at check-in to the Emergency Department and triage nurse.  Should you have questions after your visit or need to cancel or reschedule your appointment, please contact [facility]  [phone] and follow the prompts.  Office hours are 8:00 a.m. to 4:30 p.m. Monday - Friday. Please note that voicemails left after 4:00 p.m. may not be returned until the following business day.  We are closed weekends and major holidays. You have access to a nurse at all times for urgent questions. Please call the main number to the clinic [phone] and follow the prompts.  For any non-urgent questions, you may also contact your provider using MyChart. We now offer e-Visits for anyone 29 and older to request care online for non-urgent symptoms. For details visit [email or web address].   Also download the MyChart app! Go to the app store, search "MyChart", open the app, select [facility], and log in with your MyChart username and password.  Due to Covid, a mask is required upon entering the hospital/clinic. If you do not have a mask, one will be given to you upon arrival. For doctor visits, patients may have 1 support person aged 54 or older with them. For treatment visits, patients cannot have anyone with them due to current Covid guidelines and our immunocompromised population.   Azacitidine suspension for injection (subcutaneous use) What is this medicine? AZACITIDINE (ay [place]) is a chemotherapy drug. This medicine reduces the growth of cancer cells and can suppress the immune system. It is used for treating myelodysplastic syndrome or some types  of leukemia. This medicine may be used for other purposes; ask your health care provider or pharmacist if you have questions. COMMON BRAND NAME(S): Vidaza What should I tell my health care provider before I take this medicine? They need to know if you have any of these conditions:  kidney disease  liver  disease  liver tumors  an unusual or allergic reaction to azacitidine, mannitol, other medicines, foods, dyes, or preservatives  pregnant or trying to get pregnant  breast-feeding How should I use this medicine? This medicine is for injection under the skin. It is administered in a hospital or clinic by a specially trained health care professional. Talk to your pediatrician regarding the use of this medicine in children. While this drug may be prescribed for selected conditions, precautions do apply. Overdosage: If you think you have taken too much of this medicine contact a poison control center or emergency room at once. NOTE: This medicine is only for you. Do not share this medicine with others. What if I miss a dose? It is important not to miss your dose. Call your doctor or health care professional if you are unable to keep an appointment. What may interact with this medicine? Interactions have not been studied. Give your health care provider a list of all the medicines, herbs, non-prescription drugs, or dietary supplements you use. Also tell them if you smoke, drink alcohol, or use illegal drugs. Some items may interact with your medicine. This list may not describe all possible interactions. Give your health care provider a list of all the medicines, herbs, non-prescription drugs, or dietary supplements you use. Also tell them if you smoke, drink alcohol, or use illegal drugs. Some items may interact with your medicine. What should I watch for while using this medicine? Visit your doctor for checks on your progress. This drug may make you feel generally unwell. This is not uncommon, as chemotherapy can affect healthy cells as well as cancer cells. Report any side effects. Continue your course of treatment even though you feel ill unless your doctor tells you to stop. In some cases, you may be given additional medicines to help with side effects. Follow all directions for their use. Call  your doctor or health care professional for advice if you get a fever, chills or sore throat, or other symptoms of a cold or flu. Do not treat yourself. This drug decreases your body's ability to fight infections. Try to avoid being around people who are sick. This medicine may increase your risk to bruise or bleed. Call your doctor or health care professional if you notice any unusual bleeding. You may need blood work done while you are taking this medicine. Do not become pregnant while taking this medicine and for 6 months after the last dose. Women should inform their doctor if they wish to become pregnant or think they might be pregnant. Men should not father a child while taking this medicine and for 3 months after the last dose. There is a potential for serious side effects to an unborn child. Talk to your health care professional or pharmacist for more information. Do not breast-feed an infant while taking this medicine and for 1 week after the last dose. This medicine may interfere with the ability to have a child. Talk with your doctor or health care professional if you are concerned about your fertility. What side effects may I notice from receiving this medicine? Side effects that you should report to your doctor or health care  professional as soon as possible:  allergic reactions like skin rash, itching or hives, swelling of the face, lips, or tongue  low blood counts - this medicine may decrease the number of white blood cells, red blood cells and platelets. You may be at increased risk for infections and bleeding.  signs of infection - fever or chills, cough, sore throat, pain passing urine  signs of decreased platelets or bleeding - bruising, pinpoint red spots on the skin, black, tarry stools, blood in the urine  signs of decreased red blood cells - unusually weak or tired, fainting spells, lightheadedness  signs and symptoms of kidney injury like trouble passing urine or change in  the amount of urine  signs and symptoms of liver injury like dark yellow or brown urine; general ill feeling or flu-like symptoms; light-colored stools; loss of appetite; nausea; right upper belly pain; unusually weak or tired; yellowing of the eyes or skin Side effects that usually do not require medical attention (report to your doctor or health care professional if they continue or are bothersome):  constipation  diarrhea  nausea, vomiting  pain or redness at the injection site  unusually weak or tired This list may not describe all possible side effects. Call your doctor for medical advice about side effects. You may report side effects to FDA at 1-800-FDA-1088. Where should I keep my medicine? This drug is given in a hospital or clinic and will not be stored at home. NOTE: This sheet is a summary. It may not cover all possible information. If you have questions about this medicine, talk to your doctor, pharmacist, or health care provider.  2021 Elsevier/Gold Standard (2016-01-20 14:37:51)

## 2020-05-30 NOTE — Progress Notes (Signed)
Patient concerned about swelling in legs and feet, has been going on for about 1 month, inquires about what will help with this as Lasix 20mg  daily has not been helping. Edema is equal in bilateral lower extremities, extending almost up to the knees, no pain in the legs. Per Dr. Janese Banks, next step is to try compression stockings. Patient advised and will order these.

## 2020-06-03 ENCOUNTER — Inpatient Hospital Stay: Payer: Medicare HMO

## 2020-06-03 ENCOUNTER — Telehealth: Payer: Self-pay | Admitting: Oncology

## 2020-06-03 ENCOUNTER — Other Ambulatory Visit: Payer: Self-pay | Admitting: Adult Health

## 2020-06-03 VITALS — BP 122/50 | HR 76 | Temp 97.3°F | Resp 18 | Wt 205.0 lb

## 2020-06-03 DIAGNOSIS — D46C Myelodysplastic syndrome with isolated del(5q) chromosomal abnormality: Secondary | ICD-10-CM | POA: Diagnosis not present

## 2020-06-03 DIAGNOSIS — K851 Biliary acute pancreatitis without necrosis or infection: Secondary | ICD-10-CM | POA: Diagnosis not present

## 2020-06-03 DIAGNOSIS — D469 Myelodysplastic syndrome, unspecified: Secondary | ICD-10-CM

## 2020-06-03 DIAGNOSIS — K7460 Unspecified cirrhosis of liver: Secondary | ICD-10-CM | POA: Diagnosis not present

## 2020-06-03 DIAGNOSIS — R5383 Other fatigue: Secondary | ICD-10-CM | POA: Diagnosis not present

## 2020-06-03 DIAGNOSIS — Z5111 Encounter for antineoplastic chemotherapy: Secondary | ICD-10-CM | POA: Diagnosis not present

## 2020-06-03 DIAGNOSIS — C220 Liver cell carcinoma: Secondary | ICD-10-CM | POA: Diagnosis not present

## 2020-06-03 DIAGNOSIS — M545 Low back pain, unspecified: Secondary | ICD-10-CM | POA: Diagnosis not present

## 2020-06-03 MED ORDER — ONDANSETRON HCL 4 MG PO TABS
8.0000 mg | ORAL_TABLET | Freq: Once | ORAL | Status: AC
  Administered 2020-06-03: 8 mg via ORAL
  Filled 2020-06-03: qty 2

## 2020-06-03 MED ORDER — AZACITIDINE CHEMO SQ INJECTION
75.0000 mg/m2 | Freq: Once | INTRAMUSCULAR | Status: AC
  Administered 2020-06-03: 162.5 mg via SUBCUTANEOUS
  Filled 2020-06-03: qty 6.5

## 2020-06-03 NOTE — Patient Instructions (Signed)
[facility]COL[facility]    Discharge Instructions: Thank you for choosing [facility] to provide your oncology and hematology care.  If you have a lab appointment with the [facility], please go directly to the [facility] and check in at the registration area.  Wear comfortable clothing and clothing appropriate for easy access to any Portacath or PICC line.   We strive to give you quality time with your provider. You may need to reschedule your appointment if you arrive late (15 or more minutes).  Arriving late affects you and other patients whose appointments are after yours.  Also, if you miss three or more appointments without notifying the office, you may be dismissed from the clinic at the provider's discretion.      For prescription refill requests, have your pharmacy contact our office and allow 72 hours for refills to be completed.    Today you received the following chemotherapy and/or immunotherapy agents - Vidaza   To help prevent nausea and vomiting after your treatment, we encourage you to take your nausea medication as directed.  BELOW ARE SYMPTOMS THAT SHOULD BE REPORTED IMMEDIATELY: . *FEVER GREATER THAN 100.4 F (38 C) OR HIGHER . *CHILLS OR SWEATING . *NAUSEA AND VOMITING THAT IS NOT CONTROLLED WITH YOUR NAUSEA MEDICATION . *UNUSUAL SHORTNESS OF BREATH . *UNUSUAL BRUISING OR BLEEDING . *URINARY PROBLEMS (pain or burning when urinating, or frequent urination) . *BOWEL PROBLEMS (unusual diarrhea, constipation, pain near the anus) . TENDERNESS IN MOUTH AND THROAT WITH OR WITHOUT PRESENCE OF ULCERS (sore throat, sores in mouth, or a toothache) . UNUSUAL RASH, SWELLING OR PAIN  . UNUSUAL VAGINAL DISCHARGE OR ITCHING   Items with * indicate a potential emergency and should be followed up as soon as possible or go to the Emergency Department if any problems should occur.  Please show the CHEMOTHERAPY ALERT CARD or IMMUNOTHERAPY  ALERT CARD at check-in to the Emergency Department and triage nurse.  Should you have questions after your visit or need to cancel or reschedule your appointment, please contact [facility]  [phone] and follow the prompts.  Office hours are 8:00 a.m. to 4:30 p.m. Monday - Friday. Please note that voicemails left after 4:00 p.m. may not be returned until the following business day.  We are closed weekends and major holidays. You have access to a nurse at all times for urgent questions. Please call the main number to the clinic [phone] and follow the prompts.  For any non-urgent questions, you may also contact your provider using MyChart. We now offer e-Visits for anyone 46 and older to request care online for non-urgent symptoms. For details visit [email or web address].   Also download the MyChart app! Go to the app store, search "MyChart", open the app, select [facility], and log in with your MyChart username and password.  Due to Covid, a mask is required upon entering the hospital/clinic. If you do not have a mask, one will be given to you upon arrival. For doctor visits, patients may have 1 support person aged 11 or older with them. For treatment visits, patients cannot have anyone with them due to current Covid guidelines and our immunocompromised population.   Ondansetron tablets What is this medicine? ONDANSETRON (on DAN se tron) is used to treat nausea and vomiting caused by chemotherapy. It is also used to prevent or treat nausea and vomiting after surgery. This medicine may be used for other purposes; ask your health  care provider or pharmacist if you have questions. COMMON BRAND NAME(S): Zofran What should I tell my health care provider before I take this medicine? They need to know if you have any of these conditions:  heart disease  history of irregular heartbeat  liver disease  low levels of magnesium or potassium in the  blood  an unusual or allergic reaction to ondansetron, granisetron, other medicines, foods, dyes, or preservatives  pregnant or trying to get pregnant  breast-feeding How should I use this medicine? Take this medicine by mouth with a glass of water. Follow the directions on your prescription label. Take your doses at regular intervals. Do not take your medicine more often than directed. Talk to your pediatrician regarding the use of this medicine in children. Special care may be needed. Overdosage: If you think you have taken too much of this medicine contact a poison control center or emergency room at once. NOTE: This medicine is only for you. Do not share this medicine with others. What if I miss a dose? If you miss a dose, take it as soon as you can. If it is almost time for your next dose, take only that dose. Do not take double or extra doses. What may interact with this medicine? Do not take this medicine with any of the following medications:  apomorphine  certain medicines for fungal infections like fluconazole, itraconazole, ketoconazole, posaconazole, voriconazole  cisapride  dronedarone  pimozide  thioridazine This medicine may also interact with the following medications:  carbamazepine  certain medicines for depression, anxiety, or psychotic disturbances  fentanyl  linezolid  MAOIs like Carbex, Eldepryl, Marplan, Nardil, and Parnate  methylene blue (injected into a vein)  other medicines that prolong the QT interval (cause an abnormal heart rhythm) like dofetilide, ziprasidone  phenytoin  rifampicin  tramadol This list may not describe all possible interactions. Give your health care provider a list of all the medicines, herbs, non-prescription drugs, or dietary supplements you use. Also tell them if you smoke, drink alcohol, or use illegal drugs. Some items may interact with your medicine. What should I watch for while using this medicine? Check with  your doctor or health care professional right away if you have any sign of an allergic reaction. What side effects may I notice from receiving this medicine? Side effects that you should report to your doctor or health care professional as soon as possible:  allergic reactions like skin rash, itching or hives, swelling of the face, lips or tongue  breathing problems  confusion  dizziness  fast or irregular heartbeat  feeling faint or lightheaded, falls  fever and chills  loss of balance or coordination  seizures  sweating  swelling of the hands or feet  tightness in the chest  tremors  unusually weak or tired Side effects that usually do not require medical attention (report to your doctor or health care professional if they continue or are bothersome):  constipation or diarrhea  headache This list may not describe all possible side effects. Call your doctor for medical advice about side effects. You may report side effects to FDA at 1-800-FDA-1088. Where should I keep my medicine? Keep out of the reach of children. Store between 2 and 30 degrees C (36 and 86 degrees F). Throw away any unused medicine after the expiration date. NOTE: This sheet is a summary. It may not cover all possible information. If you have questions about this medicine, talk to your doctor, pharmacist, or health care provider.  2021 Elsevier/Gold Standard (2017-12-13 07:16:43)  Azacitidine suspension for injection (subcutaneous use) What is this medicine? AZACITIDINE (ay Blanchard) is a chemotherapy drug. This medicine reduces the growth of cancer cells and can suppress the immune system. It is used for treating myelodysplastic syndrome or some types of leukemia. This medicine may be used for other purposes; ask your health care provider or pharmacist if you have questions. COMMON BRAND NAME(S): Vidaza What should I tell my health care provider before I take this medicine? They need to  know if you have any of these conditions:  kidney disease  liver disease  liver tumors  an unusual or allergic reaction to azacitidine, mannitol, other medicines, foods, dyes, or preservatives  pregnant or trying to get pregnant  breast-feeding How should I use this medicine? This medicine is for injection under the skin. It is administered in a hospital or clinic by a specially trained health care professional. Talk to your pediatrician regarding the use of this medicine in children. While this drug may be prescribed for selected conditions, precautions do apply. Overdosage: If you think you have taken too much of this medicine contact a poison control center or emergency room at once. NOTE: This medicine is only for you. Do not share this medicine with others. What if I miss a dose? It is important not to miss your dose. Call your doctor or health care professional if you are unable to keep an appointment. What may interact with this medicine? Interactions have not been studied. Give your health care provider a list of all the medicines, herbs, non-prescription drugs, or dietary supplements you use. Also tell them if you smoke, drink alcohol, or use illegal drugs. Some items may interact with your medicine. This list may not describe all possible interactions. Give your health care provider a list of all the medicines, herbs, non-prescription drugs, or dietary supplements you use. Also tell them if you smoke, drink alcohol, or use illegal drugs. Some items may interact with your medicine. What should I watch for while using this medicine? Visit your doctor for checks on your progress. This drug may make you feel generally unwell. This is not uncommon, as chemotherapy can affect healthy cells as well as cancer cells. Report any side effects. Continue your course of treatment even though you feel ill unless your doctor tells you to stop. In some cases, you may be given additional medicines  to help with side effects. Follow all directions for their use. Call your doctor or health care professional for advice if you get a fever, chills or sore throat, or other symptoms of a cold or flu. Do not treat yourself. This drug decreases your body's ability to fight infections. Try to avoid being around people who are sick. This medicine may increase your risk to bruise or bleed. Call your doctor or health care professional if you notice any unusual bleeding. You may need blood work done while you are taking this medicine. Do not become pregnant while taking this medicine and for 6 months after the last dose. Women should inform their doctor if they wish to become pregnant or think they might be pregnant. Men should not father a child while taking this medicine and for 3 months after the last dose. There is a potential for serious side effects to an unborn child. Talk to your health care professional or pharmacist for more information. Do not breast-feed an infant while taking this medicine and for 1 week after the  last dose. This medicine may interfere with the ability to have a child. Talk with your doctor or health care professional if you are concerned about your fertility. What side effects may I notice from receiving this medicine? Side effects that you should report to your doctor or health care professional as soon as possible:  allergic reactions like skin rash, itching or hives, swelling of the face, lips, or tongue  low blood counts - this medicine may decrease the number of Martine Trageser blood cells, red blood cells and platelets. You may be at increased risk for infections and bleeding.  signs of infection - fever or chills, cough, sore throat, pain passing urine  signs of decreased platelets or bleeding - bruising, pinpoint red spots on the skin, black, tarry stools, blood in the urine  signs of decreased red blood cells - unusually weak or tired, fainting spells, lightheadedness  signs  and symptoms of kidney injury like trouble passing urine or change in the amount of urine  signs and symptoms of liver injury like dark yellow or brown urine; general ill feeling or flu-like symptoms; light-colored stools; loss of appetite; nausea; right upper belly pain; unusually weak or tired; yellowing of the eyes or skin Side effects that usually do not require medical attention (report to your doctor or health care professional if they continue or are bothersome):  constipation  diarrhea  nausea, vomiting  pain or redness at the injection site  unusually weak or tired This list may not describe all possible side effects. Call your doctor for medical advice about side effects. You may report side effects to FDA at 1-800-FDA-1088. Where should I keep my medicine? This drug is given in a hospital or clinic and will not be stored at home. NOTE: This sheet is a summary. It may not cover all possible information. If you have questions about this medicine, talk to your doctor, pharmacist, or health care provider.  2021 Elsevier/Gold Standard (2016-01-20 14:37:51)

## 2020-06-03 NOTE — Progress Notes (Addendum)
Pt received vidaza injection in clinic today. Tolerated well. Pt c/o itching at injection site, advised pt to take benadryl at home to help. Pt requesting appts to be moved to afternoon, [employer] and Anderson Malta (scheduler for Dr Elroy Channel team) notified via secure chat, advised pt to check mychart for updated appt times.

## 2020-06-03 NOTE — Telephone Encounter (Signed)
Second attempt to reach patient regarding upcoming appointments. VM left

## 2020-06-03 NOTE — Progress Notes (Signed)
I connected by phone with Timothy Murray on 06/03/2020, 2:54 PM to discuss the potential use of a new treatment, tixagevimab/cilgavimab, for pre-exposure prophylaxis for prevention of coronavirus disease 2019 (COVID-19) caused by the SARS-CoV-2 virus.  This patient is a 75 y.o. male that meets the FDA criteria for Emergency Use Authorization of tixagevimab/cilgavimab for pre-exposure prophylaxis of COVID-19 disease. Pt meets following criteria:  Age >12 yr and weight > 40kg  Not currently infected with SARS-CoV-2 and has no known recent exposure to an individual infected with SARS-CoV-2 AND o Who has moderate to severe immune compromise due to a medical condition or receipt of immunosuppressive medications or treatments and may not mount an adequate immune response to COVID-19 vaccination or  o Vaccination with any available COVID-19 vaccine, according to the approved or authorized schedule, is not recommended due to a history of severe adverse reaction (e.g., severe allergic reaction) to a COVID-19 vaccine(s) and/or COVID-19 vaccine component(s).  o Patient meets the following definition of mod-severe immune compromised status: 6. Other actively treated hematologic malignancies or severe congenital immunodeficiency syndromes  I have spoken and communicated the following to the patient or parent/caregiver regarding COVID monoclonal antibody treatment:  1. FDA has authorized the emergency use of tixagevimab/cilgavimab for the pre-exposure prophylaxis of COVID-19 in patients with moderate-severe immunocompromised status, who meet above EUA criteria.  2. The significant known and potential risks and benefits of COVID monoclonal antibody, and the extent to which such potential risks and benefits are unknown.  3. Information on available alternative treatments and the risks and benefits of those alternatives, including clinical trials.  4. The patient or parent/caregiver has the option to accept or  refuse COVID monoclonal antibody treatment.  After reviewing this information with the patient, agree to receive tixagevimab/cilgavimab.  Scot Dock, NP, 06/03/2020, 2:54 PM

## 2020-06-03 NOTE — Telephone Encounter (Signed)
Left VM with patient to discuss future appointments. Per infusion RN--patient would like to reschedule appointments for a later time.

## 2020-06-04 ENCOUNTER — Telehealth: Payer: Self-pay | Admitting: Oncology

## 2020-06-04 NOTE — Telephone Encounter (Signed)
Spoke with patient about upcoming appointments. Explained preference for blood transfusions to be done in the morning and he was agreeable to leave those appointments as scheduled.   Moved appts on 6/20 and day 2-5 Vidaza to the afternoon. Patient will receive new AVS when coming back to clinic tomorrow.

## 2020-06-05 ENCOUNTER — Inpatient Hospital Stay (HOSPITAL_BASED_OUTPATIENT_CLINIC_OR_DEPARTMENT_OTHER): Payer: Medicare HMO | Admitting: Nurse Practitioner

## 2020-06-05 ENCOUNTER — Encounter: Payer: Self-pay | Admitting: Nurse Practitioner

## 2020-06-05 ENCOUNTER — Other Ambulatory Visit: Payer: Self-pay | Admitting: *Deleted

## 2020-06-05 ENCOUNTER — Inpatient Hospital Stay: Payer: Medicare HMO | Attending: Nurse Practitioner

## 2020-06-05 ENCOUNTER — Encounter: Payer: Self-pay | Admitting: *Deleted

## 2020-06-05 ENCOUNTER — Telehealth: Payer: Self-pay | Admitting: *Deleted

## 2020-06-05 ENCOUNTER — Inpatient Hospital Stay: Payer: Medicare HMO

## 2020-06-05 VITALS — BP 98/60 | HR 68 | Temp 98.2°F | Resp 16 | Ht 72.0 in | Wt 207.0 lb

## 2020-06-05 DIAGNOSIS — D469 Myelodysplastic syndrome, unspecified: Secondary | ICD-10-CM

## 2020-06-05 DIAGNOSIS — Z8505 Personal history of malignant neoplasm of liver: Secondary | ICD-10-CM | POA: Diagnosis not present

## 2020-06-05 DIAGNOSIS — G4700 Insomnia, unspecified: Secondary | ICD-10-CM | POA: Insufficient documentation

## 2020-06-05 DIAGNOSIS — Z298 Encounter for other specified prophylactic measures: Secondary | ICD-10-CM | POA: Insufficient documentation

## 2020-06-05 DIAGNOSIS — D61818 Other pancytopenia: Secondary | ICD-10-CM | POA: Diagnosis not present

## 2020-06-05 DIAGNOSIS — K7460 Unspecified cirrhosis of liver: Secondary | ICD-10-CM | POA: Insufficient documentation

## 2020-06-05 DIAGNOSIS — Z9221 Personal history of antineoplastic chemotherapy: Secondary | ICD-10-CM | POA: Diagnosis not present

## 2020-06-05 DIAGNOSIS — Z5111 Encounter for antineoplastic chemotherapy: Secondary | ICD-10-CM | POA: Insufficient documentation

## 2020-06-05 DIAGNOSIS — I2510 Atherosclerotic heart disease of native coronary artery without angina pectoris: Secondary | ICD-10-CM | POA: Diagnosis not present

## 2020-06-05 DIAGNOSIS — D649 Anemia, unspecified: Secondary | ICD-10-CM

## 2020-06-05 DIAGNOSIS — E119 Type 2 diabetes mellitus without complications: Secondary | ICD-10-CM | POA: Insufficient documentation

## 2020-06-05 DIAGNOSIS — D696 Thrombocytopenia, unspecified: Secondary | ICD-10-CM

## 2020-06-05 LAB — COMPREHENSIVE METABOLIC PANEL
ALT: 23 U/L (ref 0–44)
AST: 23 U/L (ref 15–41)
Albumin: 3.9 g/dL (ref 3.5–5.0)
Alkaline Phosphatase: 72 U/L (ref 38–126)
Anion gap: 9 (ref 5–15)
BUN: 17 mg/dL (ref 8–23)
CO2: 24 mmol/L (ref 22–32)
Calcium: 8.5 mg/dL — ABNORMAL LOW (ref 8.9–10.3)
Chloride: 106 mmol/L (ref 98–111)
Creatinine, Ser: 0.98 mg/dL (ref 0.61–1.24)
GFR, Estimated: >60 mL/min (ref >60)
Glucose, Bld: 194 mg/dL — ABNORMAL HIGH (ref 70–99)
Potassium: 4.4 mmol/L (ref 3.5–5.1)
Sodium: 139 mmol/L (ref 135–145)
Total Bilirubin: 1.8 mg/dL — ABNORMAL HIGH (ref 0.3–1.2)
Total Protein: 6.1 g/dL — ABNORMAL LOW (ref 6.5–8.1)

## 2020-06-05 LAB — SAMPLE TO BLOOD BANK

## 2020-06-05 LAB — CBC WITH DIFFERENTIAL/PLATELET
Abs Immature Granulocytes: 0 10*3/uL (ref 0.00–0.07)
Basophils Absolute: 0 10*3/uL (ref 0.0–0.1)
Basophils Relative: 1 %
Eosinophils Absolute: 0 10*3/uL (ref 0.0–0.5)
Eosinophils Relative: 1 %
HCT: 18.9 % — ABNORMAL LOW (ref 39.0–52.0)
Hemoglobin: 6.6 g/dL — ABNORMAL LOW (ref 13.0–17.0)
Immature Granulocytes: 0 %
Lymphocytes Relative: 62 %
Lymphs Abs: 0.5 10*3/uL — ABNORMAL LOW (ref 0.7–4.0)
MCH: 35.5 pg — ABNORMAL HIGH (ref 26.0–34.0)
MCHC: 34.9 g/dL (ref 30.0–36.0)
MCV: 101.6 fL — ABNORMAL HIGH (ref 80.0–100.0)
Monocytes Absolute: 0.1 10*3/uL (ref 0.1–1.0)
Monocytes Relative: 9 %
Neutro Abs: 0.2 10*3/uL — CL (ref 1.7–7.7)
Neutrophils Relative %: 27 %
Platelets: 19 10*3/uL — CL (ref 150–400)
RBC: 1.86 MIL/uL — ABNORMAL LOW (ref 4.22–5.81)
RDW: 18.1 % — ABNORMAL HIGH (ref 11.5–15.5)
Smear Review: NORMAL
WBC: 0.8 10*3/uL — CL (ref 4.0–10.5)
nRBC: 0 % (ref 0.0–0.2)

## 2020-06-05 LAB — PREPARE RBC (CROSSMATCH)

## 2020-06-05 MED ORDER — TIXAGEVIMAB (PART OF EVUSHELD) INJECTION
300.0000 mg | Freq: Once | INTRAMUSCULAR | Status: AC
  Administered 2020-06-05: 300 mg via INTRAMUSCULAR
  Filled 2020-06-05: qty 3

## 2020-06-05 MED ORDER — ACETAMINOPHEN 325 MG PO TABS
650.0000 mg | ORAL_TABLET | Freq: Once | ORAL | Status: AC
  Administered 2020-06-05: 650 mg via ORAL
  Filled 2020-06-05: qty 2

## 2020-06-05 MED ORDER — SODIUM CHLORIDE 0.9% IV SOLUTION
250.0000 mL | Freq: Once | INTRAVENOUS | Status: AC
  Administered 2020-06-05: 250 mL via INTRAVENOUS
  Filled 2020-06-05: qty 250

## 2020-06-05 MED ORDER — DIPHENHYDRAMINE HCL 25 MG PO CAPS
25.0000 mg | ORAL_CAPSULE | Freq: Once | ORAL | Status: AC
  Administered 2020-06-05: 25 mg via ORAL
  Filled 2020-06-05: qty 1

## 2020-06-05 MED ORDER — CILGAVIMAB (PART OF EVUSHELD) INJECTION
300.0000 mg | Freq: Once | INTRAMUSCULAR | Status: AC
  Administered 2020-06-05: 300 mg via INTRAMUSCULAR
  Filled 2020-06-05: qty 3

## 2020-06-05 NOTE — Telephone Encounter (Signed)
Prescription for Knee High compression stockings faxed to [facility] with patient demographics. Sent message to patient via my chart.

## 2020-06-05 NOTE — Progress Notes (Signed)
Hematology/Oncology Consult note [facility]  [phone] [phone]  Patient Care Team: Danelle Berry, NP as PCP - General (Nurse Practitioner) Dionisio David, MD (Cardiology) Lequita Asal, MD as Referring Physician (Hematology and Oncology) Sindy Guadeloupe, MD as Consulting Physician (Hematology and Oncology)   Name of the patient: Timothy Murray  [id number]  08-Feb-1945   Date of visit: 06/05/20  Diagnosis- MDS with 5q del but high risk given TP53 mutation  HCC s/p thermal ablation  Chief complaint/ Reason for visit-routine follow-up of MDS  Heme/Onc history: Patient is a 75 year old male with a history of cirrhosis and hepatocellular carcinoma for which she had proton beam radiation In June 2021.  He has baseline thrombocytopenia which was initially attributed to his cirrhosis.  He was found to have progressive pancytopenia with a steady decline in his white cell count since March 2022 when it was down to 3.4 with a platelet count of 53.  He was hospitalized sometime in April 2022 with acute biliary gallstone pancreatitis .  He underwent ERCP inpatient with platelet transfusion which showed sludge and biliary sphincterotomy was performed.  His bilirubin which was elevated up to 7.2 has come down to 2.4 presently.  However his pancytopenia has continued to worsen and presently white count is 1.1 with an H&H of 6.2/17.6 with an MCV of 102 and a platelet count of 33.Bone marrow biopsy showed evidence of dyspoietic changes in the erythroid and megakaryocyte excel lines associated with ring sideroblasts.  Definite increase in blastic cells not identified.  Overall features favor myelodysplastic syndrome.  Karyotype was normal but only for metaphase cells were analyzed which were not sufficient.  [facility] FISH MDS panel showed deletion of 5 q. along with monosomy 7 and deletion 7 q. without excess blasts.  SF3B1 mutation negative.  T p53  positive  With regards to his hepatocellular carcinoma patient had an MRI abdomen on 04/24/2020 which showed that his primary liver mass which was previously 4.3 x 3.6 cmWas similar in size at 4.5 x 3.5 cm.   Interval history-patient returns to clinic for reevaluation and consideration of blood transfusion. He's not yet received Evusheld. Questions ivermectin. Has tried using compression socks but they are difficult to apply. He's completed C1D5 5/24-5/31/22. Has some fatigue and exertional shortness of breath. This is roughly the same.   ECOG PS- 2 Pain scale- 0   Review of systems- Review of Systems  Constitutional: Positive for malaise/fatigue. Negative for chills, fever and weight loss.  HENT: Negative for congestion, ear discharge and nosebleeds.   Eyes: Negative for blurred vision.  Respiratory: Positive for shortness of breath. Negative for cough, hemoptysis, sputum production and wheezing.   Cardiovascular: Positive for leg swelling. Negative for chest pain, palpitations, orthopnea and claudication.  Gastrointestinal: Negative for abdominal pain, blood in stool, constipation, diarrhea, heartburn, melena, nausea and vomiting.  Genitourinary: Negative for dysuria, flank pain, frequency, hematuria and urgency.  Musculoskeletal: Negative for back pain, joint pain and myalgias.  Skin: Negative for rash.  Neurological: Negative for dizziness, tingling, focal weakness, seizures, weakness and headaches.  Endo/Heme/Allergies: Does not bruise/bleed easily.  Psychiatric/Behavioral: Negative for depression and suicidal ideas. The patient does not have insomnia.       Allergies  Allergen Reactions  . Soap & Cleansers     "liquid soap" antibacterial -rash  . Latex Rash and Itching     Past Medical History:  Diagnosis Date  . Arthritis    "lower back" (10/24/2012)  .  Coronary artery disease   . GERD (gastroesophageal reflux disease)   . High cholesterol   . Hypertension   . Liver  cancer ([place])   . Type II diabetes mellitus ([place])      Past Surgical History:  Procedure Laterality Date  . APPENDECTOMY  2002  . CARDIAC CATHETERIZATION  10/24/2012  . CORONARY ARTERY BYPASS GRAFT N/A 10/26/2012   Procedure: CORONARY ARTERY BYPASS GRAFTING (CABG);  Surgeon: Melrose Nakayama, MD;  Location: [place];  Service: Open Heart Surgery;  Laterality: N/A;  Times 3 using left internal mammary artery and endoscopically harvested right saphenous vein  . ERCP N/A 04/25/2020   Procedure: ENDOSCOPIC RETROGRADE CHOLANGIOPANCREATOGRAPHY (ERCP);  Surgeon: Lucilla Lame, MD;  [facility] ENDOSCOPY;  Service: Endoscopy;  Laterality: N/A;    Social History   Socioeconomic History  . Marital status: Married    Spouse name: Not on file  . Number of children: Not on file  . Years of education: Not on file  . Highest education level: Not on file  Occupational History  . Not on file  Tobacco Use  . Smoking status: Former Smoker    Packs/day: 0.50    Years: 30.00    Pack years: 15.00    Types: Cigarettes    Quit date: 08/28/2001    Years since quitting: 18.7  . Smokeless tobacco: Never Used  Vaping Use  . Vaping Use: Not on file  Substance and Sexual Activity  . Alcohol use: No    Alcohol/week: 0.0 standard drinks    Comment: 10/24/2012 "stopped drinking alcohol in 2003; used to drink ~ 9 bottles beer/wk"  . Drug use: No  . Sexual activity: Yes  Other Topics Concern  . Not on file  Social History Narrative  . Not on file   Social Determinants of Health   Financial Resource Strain: Not on file  Food Insecurity: Not on file  Transportation Needs: Not on file  Physical Activity: Not on file  Stress: Not on file  Social Connections: Not on file  Intimate Partner Violence: Not on file    Family History  Problem Relation Age of Onset  . Cancer Sister      Current Outpatient Medications:  .  acyclovir (ZOVIRAX) 400 MG tablet, Take 1 tablet (400 mg total) by mouth 2  (two) times daily., Disp: 60 tablet, Rfl: 1 .  atorvastatin (LIPITOR) 40 MG tablet, Take 1 tablet by mouth daily., Disp: , Rfl:  .  glimepiride (AMARYL) 1 MG tablet, Take 1 mg by mouth daily., Disp: , Rfl:  .  glucosamine-chondroitin 500-400 MG tablet, Take 1 tablet by mouth daily., Disp: , Rfl:  .  levofloxacin (LEVAQUIN) 500 MG tablet, Take 1 tablet (500 mg total) by mouth daily., Disp: 30 tablet, Rfl: 0 .  loratadine (CLARITIN) 10 MG tablet, Take 5 mg by mouth daily as needed. Take half a tablet (71m) daily, Disp: , Rfl:  .  losartan (COZAAR) 25 MG tablet, Take 25 mg by mouth daily., Disp: , Rfl:  .  metoprolol tartrate (LOPRESSOR) 25 MG tablet, Take 25 mg by mouth daily., Disp: , Rfl:  .  omeprazole (PRILOSEC) 20 MG capsule, Take 40 mg by mouth every morning., Disp: , Rfl:  .  oxyCODONE (OXY IR/ROXICODONE) 5 MG immediate release tablet, Take 1 tablet (5 mg total) by mouth every 8 (eight) hours as needed for severe pain., Disp: 90 tablet, Rfl: 0 .  lidocaine-prilocaine (EMLA) cream, Apply to affected area once (Patient not taking:  Reported on 06/05/2020), Disp: 30 g, Rfl: 3 .  LORazepam (ATIVAN) 0.5 MG tablet, Take 1 tablet (0.5 mg total) by mouth every 6 (six) hours as needed (Nausea or vomiting). (Patient not taking: Reported on 06/05/2020), Disp: 30 tablet, Rfl: 0 .  ondansetron (ZOFRAN) 8 MG tablet, Take 1 tablet (8 mg total) by mouth 2 (two) times daily as needed (Nausea or vomiting). (Patient not taking: Reported on 06/05/2020), Disp: 30 tablet, Rfl: 1 .  prochlorperazine (COMPAZINE) 10 MG tablet, Take 1 tablet (10 mg total) by mouth every 6 (six) hours as needed (Nausea or vomiting). (Patient not taking: Reported on 06/05/2020), Disp: 30 tablet, Rfl: 1  Physical exam:  Vitals:   06/05/20 0924  BP: 98/60  Pulse: 68  Resp: 16  Temp: 98.2 F (36.8 C)  TempSrc: Oral  Weight: 207 lb (93.9 kg)  Height: 6' (1.829 m)   Physical Exam Constitutional:      General: He is not in acute distress.     Comments: Sitting in a wheelchair.  Appears fatigued  HENT:     Head: Normocephalic and atraumatic.  Cardiovascular:     Rate and Rhythm: Normal rate and regular rhythm.     Heart sounds: Normal heart sounds.  Pulmonary:     Effort: Pulmonary effort is normal.     Breath sounds: Normal breath sounds.  Abdominal:     General: There is no distension.     Tenderness: There is no abdominal tenderness.  Musculoskeletal:     Right lower leg: Edema present.     Left lower leg: Edema present.  Skin:    General: Skin is warm and dry.  Neurological:     Mental Status: He is alert and oriented to person, place, and time.  Psychiatric:        Mood and Affect: Mood normal.        Behavior: Behavior normal.      CMP Latest Ref Rng & Units 06/05/2020  Glucose 70 - 99 mg/dL 194(H)  BUN 8 - 23 mg/dL 17  Creatinine 0.61 - 1.24 mg/dL 0.98  Sodium 135 - 145 mmol/L 139  Potassium 3.5 - 5.1 mmol/L 4.4  Chloride 98 - 111 mmol/L 106  CO2 22 - 32 mmol/L 24  Calcium 8.9 - 10.3 mg/dL 8.5(L)  Total Protein 6.5 - 8.1 g/dL 6.1(L)  Total Bilirubin 0.3 - 1.2 mg/dL 1.8(H)  Alkaline Phos 38 - 126 U/L 72  AST 15 - 41 U/L 23  ALT 0 - 44 U/L 23   CBC Latest Ref Rng & Units 06/05/2020  WBC 4.0 - 10.5 K/uL 0.8(LL)  Hemoglobin 13.0 - 17.0 g/dL 6.6(L)  Hematocrit 39.0 - 52.0 % 18.9(L)  Platelets 150 - 400 K/uL 19(LL)       No results found.   Assessment and plan- Patient is a 75 y.o. male with high risk MDS and T p53 mutation, currently s/p cycle 1 of vidaza, returns to clinic for follow up.   1. MDS- high risk based on T p53 mutation. Previously on revlimid prior to results showing T p53 mutation. He is currently s/p cycle 1 of Vidaza 75 mg/m2 on days 1-5 of 28 day cycle. Plan is to continue treatment until progression or unacceptable toxicity. Treatment given with palliative intent. We again reviewed disease process, rationale for treatment, and side effects of treatment today.  Continue to hold  starting venetoclax given baseline significant pancytopenia and borderline performance status.  Dr. Janese Banks will reconsider in the future.  2. Symptomatic anemia- currently transfusion dependent. Hemoglobin 6.6. Proceed with transfusion today.   3. THrombocytopenia- Platelet count 19. If platelet counts of less than 15, would recommend platelet transfusion  4. Neutropenia- ANC low at 200. Increased risk of infection with Vidaza. Reviewed neutropenic precautions. Continue prophylactic acyclovir and levaquin.   5. Low back pain- nonmalignant. Avoid NSAIDs in setting of anemia. Continue oxycodone as prescribed.   6. [place]- s/p thermal ablation. Continue to monitor with scans in the future.   7. Bowel prophylaxis- continue mirlax +/- senna to achieve one bm per day without pain or straining  8. Covid 19 Prophylaxis- proceed with evusheld today  9. Leg swelling- bilateral. Suspect dependent edema. Recommend compression garments. Prescription provided today.   Plan:  Proceed with transfusion & Evusheld today Follow up with Dr. Janese Banks as scheduled.   Visit Diagnosis 1. MDS (myelodysplastic syndrome) ([place])   2. Symptomatic anemia   3. Thrombocytopenia ([place])   4. Other pancytopenia ([place])     Beckey Rutter, DNP, AGNP-C [place] at [facility] [phone] (clinic)

## 2020-06-05 NOTE — Progress Notes (Signed)
Pt states that he is wearing compression hose and it is helping with feet edema. He want to know if he can use epsom salt and how much salt to put in. I told him to ask Lauren about this. I asked if he elevates legs at night and he does not because of back pain when he elevates. Marland Kitchen He is using melatonin for sleep but not helping. Marland Kitchen He asked about Bowels and he use to have BM once a day and now it is every other day but his appetite is not the same but yest. He had a good appetite.

## 2020-06-05 NOTE — Patient Instructions (Signed)

## 2020-06-06 LAB — TYPE AND SCREEN
ABO/RH(D): A POS
Antibody Screen: NEGATIVE
Unit division: 0

## 2020-06-06 LAB — BPAM RBC
Blood Product Expiration Date: 202206132359
ISSUE DATE / TIME: 202206021049
Unit Type and Rh: 600

## 2020-06-09 ENCOUNTER — Encounter: Payer: Self-pay | Admitting: Oncology

## 2020-06-09 DIAGNOSIS — I2510 Atherosclerotic heart disease of native coronary artery without angina pectoris: Secondary | ICD-10-CM | POA: Diagnosis not present

## 2020-06-09 DIAGNOSIS — E138 Other specified diabetes mellitus with unspecified complications: Secondary | ICD-10-CM | POA: Diagnosis not present

## 2020-06-09 DIAGNOSIS — I10 Essential (primary) hypertension: Secondary | ICD-10-CM | POA: Diagnosis not present

## 2020-06-12 ENCOUNTER — Other Ambulatory Visit: Payer: Self-pay | Admitting: Oncology

## 2020-06-12 ENCOUNTER — Inpatient Hospital Stay: Payer: Medicare HMO

## 2020-06-12 ENCOUNTER — Encounter: Payer: Self-pay | Admitting: Oncology

## 2020-06-12 ENCOUNTER — Inpatient Hospital Stay: Payer: Medicare HMO | Admitting: Oncology

## 2020-06-12 VITALS — BP 114/55 | HR 91 | Temp 97.5°F | Resp 20 | Wt 197.0 lb

## 2020-06-12 DIAGNOSIS — D649 Anemia, unspecified: Secondary | ICD-10-CM

## 2020-06-12 DIAGNOSIS — D696 Thrombocytopenia, unspecified: Secondary | ICD-10-CM

## 2020-06-12 DIAGNOSIS — Z9221 Personal history of antineoplastic chemotherapy: Secondary | ICD-10-CM | POA: Diagnosis not present

## 2020-06-12 DIAGNOSIS — E119 Type 2 diabetes mellitus without complications: Secondary | ICD-10-CM | POA: Diagnosis not present

## 2020-06-12 DIAGNOSIS — D469 Myelodysplastic syndrome, unspecified: Secondary | ICD-10-CM

## 2020-06-12 DIAGNOSIS — Z298 Encounter for other specified prophylactic measures: Secondary | ICD-10-CM | POA: Diagnosis not present

## 2020-06-12 DIAGNOSIS — G4700 Insomnia, unspecified: Secondary | ICD-10-CM | POA: Diagnosis not present

## 2020-06-12 DIAGNOSIS — Z8505 Personal history of malignant neoplasm of liver: Secondary | ICD-10-CM | POA: Diagnosis not present

## 2020-06-12 DIAGNOSIS — I2510 Atherosclerotic heart disease of native coronary artery without angina pectoris: Secondary | ICD-10-CM | POA: Diagnosis not present

## 2020-06-12 DIAGNOSIS — K7460 Unspecified cirrhosis of liver: Secondary | ICD-10-CM | POA: Diagnosis not present

## 2020-06-12 DIAGNOSIS — Z5111 Encounter for antineoplastic chemotherapy: Secondary | ICD-10-CM | POA: Diagnosis not present

## 2020-06-12 LAB — COMPREHENSIVE METABOLIC PANEL
ALT: 20 U/L (ref 0–44)
AST: 24 U/L (ref 15–41)
Albumin: 3.7 g/dL (ref 3.5–5.0)
Alkaline Phosphatase: 82 U/L (ref 38–126)
Anion gap: 11 (ref 5–15)
BUN: 16 mg/dL (ref 8–23)
CO2: 23 mmol/L (ref 22–32)
Calcium: 8.6 mg/dL — ABNORMAL LOW (ref 8.9–10.3)
Chloride: 105 mmol/L (ref 98–111)
Creatinine, Ser: 0.91 mg/dL (ref 0.61–1.24)
GFR, Estimated: >60 mL/min (ref >60)
Glucose, Bld: 182 mg/dL — ABNORMAL HIGH (ref 70–99)
Potassium: 3.9 mmol/L (ref 3.5–5.1)
Sodium: 139 mmol/L (ref 135–145)
Total Bilirubin: 2.2 mg/dL — ABNORMAL HIGH (ref 0.3–1.2)
Total Protein: 6.2 g/dL — ABNORMAL LOW (ref 6.5–8.1)

## 2020-06-12 LAB — CBC WITH DIFFERENTIAL/PLATELET
Abs Immature Granulocytes: 0 10*3/uL (ref 0.00–0.07)
Basophils Absolute: 0 10*3/uL (ref 0.0–0.1)
Basophils Relative: 0 %
Eosinophils Absolute: 0 10*3/uL (ref 0.0–0.5)
Eosinophils Relative: 0 %
HCT: 18.6 % — ABNORMAL LOW (ref 39.0–52.0)
Hemoglobin: 6.6 g/dL — ABNORMAL LOW (ref 13.0–17.0)
Immature Granulocytes: 0 %
Lymphocytes Relative: 85 %
Lymphs Abs: 0.5 10*3/uL — ABNORMAL LOW (ref 0.7–4.0)
MCH: 34.7 pg — ABNORMAL HIGH (ref 26.0–34.0)
MCHC: 35.5 g/dL (ref 30.0–36.0)
MCV: 97.9 fL (ref 80.0–100.0)
Monocytes Absolute: 0 10*3/uL — ABNORMAL LOW (ref 0.1–1.0)
Monocytes Relative: 4 %
Neutro Abs: 0.1 10*3/uL — CL (ref 1.7–7.7)
Neutrophils Relative %: 11 %
Platelets: 7 10*3/uL — CL (ref 150–400)
RBC: 1.9 MIL/uL — ABNORMAL LOW (ref 4.22–5.81)
RDW: 17.6 % — ABNORMAL HIGH (ref 11.5–15.5)
Smear Review: NORMAL
WBC: 0.5 10*3/uL — CL (ref 4.0–10.5)
nRBC: 0 % (ref 0.0–0.2)

## 2020-06-12 LAB — SAMPLE TO BLOOD BANK

## 2020-06-12 LAB — PREPARE RBC (CROSSMATCH)

## 2020-06-12 MED ORDER — SODIUM CHLORIDE 0.9% IV SOLUTION
250.0000 mL | Freq: Once | INTRAVENOUS | Status: AC
  Filled 2020-06-12: qty 250

## 2020-06-12 MED ORDER — DIPHENHYDRAMINE HCL 50 MG/ML IJ SOLN
25.0000 mg | Freq: Once | INTRAMUSCULAR | Status: AC
  Administered 2020-06-12: 25 mg via INTRAVENOUS
  Filled 2020-06-12: qty 1

## 2020-06-12 MED ORDER — TRAZODONE HCL 50 MG PO TABS: 50.0000 mg | ORAL_TABLET | Freq: Every day | ORAL | 0 refills | Status: DC

## 2020-06-12 MED ORDER — SODIUM CHLORIDE 0.9% IV SOLUTION
250.0000 mL | Freq: Once | INTRAVENOUS | Status: AC
  Administered 2020-06-12: 250 mL via INTRAVENOUS
  Filled 2020-06-12: qty 250

## 2020-06-12 MED ORDER — ACETAMINOPHEN 325 MG PO TABS
650.0000 mg | ORAL_TABLET | Freq: Once | ORAL | Status: AC
  Administered 2020-06-12: 650 mg via ORAL
  Filled 2020-06-12: qty 2

## 2020-06-12 NOTE — Patient Instructions (Signed)

## 2020-06-12 NOTE — Progress Notes (Signed)
Hematology/Oncology Consult note [facility]  Telephone:([phone] [phone]  Patient Care Team: Danelle Berry, NP as PCP - General (Nurse Practitioner) Dionisio David, MD (Cardiology) Sindy Guadeloupe, MD as Consulting Physician (Hematology and Oncology)   Name of the patient: Timothy Murray  [id number]  1945-11-09   Date of visit: 06/12/20  Diagnosis- MDS with 5q del but high risk given TP53 mutation  HCC s/p thermal ablation  Chief complaint/ Reason for visit-routine follow-up of MDS  Heme/Onc history: Patient is a 75 year old male with a history of cirrhosis and hepatocellular carcinoma for which she had proton beam radiation In June 2021.  He has baseline thrombocytopenia which was initially attributed to his cirrhosis.  He was found to have progressive pancytopenia with a steady decline in his white cell count since March 2022 when it was down to 3.4 with a platelet count of 53.  He was hospitalized sometime in April 2022 with acute biliary gallstone pancreatitis .  He underwent ERCP inpatient with platelet transfusion which showed sludge and biliary sphincterotomy was performed.  His bilirubin which was elevated up to 7.2 has come down to 2.4 presently.   However his pancytopenia has continued to worsen and presently white count is 1.1 with an H&H of 6.2/17.6 with an MCV of 102 and a platelet count of 33.Bone marrow biopsy showed evidence of dyspoietic changes in the erythroid and megakaryocyte excel lines associated with ring sideroblasts.  Definite increase in blastic cells not identified.  Overall features favor myelodysplastic syndrome.  Karyotype was normal but only for metaphase cells were analyzed which were not sufficient.  [facility] FISH MDS panel showed deletion of 5 q. along with monosomy 7 and deletion 7 q. without excess blasts.  SF3B1 mutation negative.  T p53 positive   With regards to his hepatocellular carcinoma patient had an  MRI abdomen on 04/24/2020 which showed that his primary liver mass which was previously 4.3 x 3.6 cmWas similar in size at 4.5 x 3.5 cm.   Interval history-patient returns to clinic for evaluation and consideration of a blood transfusion.  He completed cycle 1 5/24-5/31 of Vidaza.  He reports feeling some better.  He has been receiving intermittent blood transfusions for declining hemoglobin. He last received a blood transfusion on 06/06/2020.  Reports insomnia and has tried melatonin without any relief.  Denies any active bleeding.  Energy level has improved some.  Has lower extremity swelling and uses compression stockings.  ECOG PS- 2 Pain scale- 0   Review of systems- Review of Systems  Constitutional:  Positive for malaise/fatigue. Negative for chills, fever and weight loss.  HENT:  Negative for congestion, ear pain and tinnitus.   Eyes: Negative.  Negative for blurred vision and double vision.  Respiratory:  Positive for shortness of breath. Negative for cough and sputum production.   Cardiovascular: Negative.  Negative for chest pain, palpitations and leg swelling.  Gastrointestinal: Negative.  Negative for abdominal pain, constipation, diarrhea, nausea and vomiting.  Genitourinary:  Negative for dysuria, frequency and urgency.  Musculoskeletal:  Negative for back pain and falls.  Skin: Negative.  Negative for rash.  Neurological:  Positive for weakness. Negative for headaches.  Endo/Heme/Allergies: Negative.  Does not bruise/bleed easily.  Psychiatric/Behavioral:  Negative for depression. The patient has insomnia. The patient is not nervous/anxious.      Allergies  Allergen Reactions   Soap & Cleansers     "liquid soap" antibacterial -rash   Latex Rash and Itching  Past Medical History:  Diagnosis Date   Arthritis    "lower back" (10/24/2012)   Coronary artery disease    GERD (gastroesophageal reflux disease)    High cholesterol    Hypertension    Liver cancer ([place])     MDS (myelodysplastic syndrome) ([place])    Type II diabetes mellitus ([place])      Past Surgical History:  Procedure Laterality Date   APPENDECTOMY  2002   CARDIAC CATHETERIZATION  10/24/2012   CORONARY ARTERY BYPASS GRAFT N/A 10/26/2012   Procedure: CORONARY ARTERY BYPASS GRAFTING (CABG);  Surgeon: Melrose Nakayama, MD;  Location: [place];  Service: Open Heart Surgery;  Laterality: N/A;  Times 3 using left internal mammary artery and endoscopically harvested right saphenous vein   ERCP N/A 04/25/2020   Procedure: ENDOSCOPIC RETROGRADE CHOLANGIOPANCREATOGRAPHY (ERCP);  Surgeon: Lucilla Lame, MD;  Location: [facility] ENDOSCOPY;  Service: Endoscopy;  Laterality: N/A;    Social History   Socioeconomic History   Marital status: Married    Spouse name: Not on file   Number of children: Not on file   Years of education: Not on file   Highest education level: Not on file  Occupational History   Not on file  Tobacco Use   Smoking status: Former    Packs/day: 0.50    Years: 30.00    Pack years: 15.00    Types: Cigarettes    Quit date: 08/28/2001    Years since quitting: 18.8   Smokeless tobacco: Never  Vaping Use   Vaping Use: Not on file  Substance and Sexual Activity   Alcohol use: No    Alcohol/week: 0.0 standard drinks    Comment: 10/24/2012 "stopped drinking alcohol in 2003; used to drink ~ 9 bottles beer/wk"   Drug use: No   Sexual activity: Yes  Other Topics Concern   Not on file  Social History Narrative   Not on file   Social Determinants of Health   Financial Resource Strain: Not on file  Food Insecurity: Not on file  Transportation Needs: Not on file  Physical Activity: Not on file  Stress: Not on file  Social Connections: Not on file  Intimate Partner Violence: Not on file    Family History  Problem Relation Age of Onset   Cancer Sister      Current Outpatient Medications:    acyclovir (ZOVIRAX) 400 MG tablet, Take 1 tablet (400 mg total) by mouth 2 (two)  times daily., Disp: 60 tablet, Rfl: 1   atorvastatin (LIPITOR) 40 MG tablet, Take 1 tablet by mouth daily., Disp: , Rfl:    glimepiride (AMARYL) 1 MG tablet, Take 1 mg by mouth daily., Disp: , Rfl:    glucosamine-chondroitin 500-400 MG tablet, Take 1 tablet by mouth daily., Disp: , Rfl:    levofloxacin (LEVAQUIN) 500 MG tablet, Take 1 tablet (500 mg total) by mouth daily., Disp: 30 tablet, Rfl: 0   lidocaine-prilocaine (EMLA) cream, Apply to affected area once (Patient not taking: Reported on 06/05/2020), Disp: 30 g, Rfl: 3   loratadine (CLARITIN) 10 MG tablet, Take 5 mg by mouth daily as needed. Take half a tablet (48m) daily, Disp: , Rfl:    LORazepam (ATIVAN) 0.5 MG tablet, Take 1 tablet (0.5 mg total) by mouth every 6 (six) hours as needed (Nausea or vomiting). (Patient not taking: Reported on 06/05/2020), Disp: 30 tablet, Rfl: 0   losartan (COZAAR) 25 MG tablet, Take 25 mg by mouth daily., Disp: , Rfl:    metoprolol  tartrate (LOPRESSOR) 25 MG tablet, Take 25 mg by mouth daily., Disp: , Rfl:    omeprazole (PRILOSEC) 20 MG capsule, Take 40 mg by mouth every morning., Disp: , Rfl:    ondansetron (ZOFRAN) 8 MG tablet, Take 1 tablet (8 mg total) by mouth 2 (two) times daily as needed (Nausea or vomiting). (Patient not taking: Reported on 06/05/2020), Disp: 30 tablet, Rfl: 1   oxyCODONE (OXY IR/ROXICODONE) 5 MG immediate release tablet, Take 1 tablet (5 mg total) by mouth every 8 (eight) hours as needed for severe pain., Disp: 90 tablet, Rfl: 0   prochlorperazine (COMPAZINE) 10 MG tablet, Take 1 tablet (10 mg total) by mouth every 6 (six) hours as needed (Nausea or vomiting). (Patient not taking: Reported on 06/05/2020), Disp: 30 tablet, Rfl: 1 No current facility-administered medications for this visit.  Facility-Administered Medications Ordered in Other Visits:    0.9 %  sodium chloride infusion (Manually program via Guardrails IV Fluids), 250 mL, Intravenous, Once, Jacquelin Hawking, NP  Physical exam:   Vitals:   06/12/20 0843  BP: (!) 114/55  Pulse: 91  Resp: 20  Temp: (!) 97.5 F (36.4 C)  SpO2: 100%  Weight: 197 lb (89.4 kg)   Physical Exam Constitutional:      Appearance: Normal appearance.  HENT:     Head: Normocephalic and atraumatic.  Eyes:     Pupils: Pupils are equal, round, and reactive to light.  Cardiovascular:     Rate and Rhythm: Normal rate and regular rhythm.     Heart sounds: Normal heart sounds. No murmur heard. Pulmonary:     Effort: Pulmonary effort is normal.     Breath sounds: Normal breath sounds. No wheezing.  Abdominal:     General: Bowel sounds are normal. There is no distension.     Palpations: Abdomen is soft.     Tenderness: There is no abdominal tenderness.  Musculoskeletal:        General: Normal range of motion.     Cervical back: Normal range of motion.  Skin:    General: Skin is warm and dry.     Findings: No rash.  Neurological:     Mental Status: He is alert and oriented to person, place, and time.  Psychiatric:        Judgment: Judgment normal.     CMP Latest Ref Rng & Units 06/12/2020  Glucose 70 - 99 mg/dL 182(H)  BUN 8 - 23 mg/dL 16  Creatinine 0.61 - 1.24 mg/dL 0.91  Sodium 135 - 145 mmol/L 139  Potassium 3.5 - 5.1 mmol/L 3.9  Chloride 98 - 111 mmol/L 105  CO2 22 - 32 mmol/L 23  Calcium 8.9 - 10.3 mg/dL 8.6(L)  Total Protein 6.5 - 8.1 g/dL 6.2(L)  Total Bilirubin 0.3 - 1.2 mg/dL 2.2(H)  Alkaline Phos 38 - 126 U/L 82  AST 15 - 41 U/L 24  ALT 0 - 44 U/L 20   CBC Latest Ref Rng & Units 06/12/2020  WBC 4.0 - 10.5 K/uL 0.5(LL)  Hemoglobin 13.0 - 17.0 g/dL 6.6(L)  Hematocrit 39.0 - 52.0 % 18.6(L)  Platelets 150 - 400 K/uL 7(LL)       No results found.   Assessment and plan- Patient is a 75 y.o. male with high risk MDS and T p53 mutation, currently s/p cycle 1 of vidaza, returns to clinic for follow up and possible blood transfusion.  1. MDS- high risk based on T p53 mutation. Previously on revlimid prior to results  showing T p53 mutation. He is currently s/p cycle 1 of Vidaza 75 mg/m2 on days 1-5 of 28 day cycle last given from 5/24-5/31. Plan is to continue treatment until progression or unacceptable toxicity. Treatment given with palliative intent. Continue to hold starting venetoclax given baseline significant pancytopenia and borderline performance status.  Dr. Janese Banks will reconsider in the future.   2. Symptomatic anemia- currently transfusion dependent.  Hemoglobin is 6.6 today.  Proceed with 1 unit packed red blood cells.  3. Thrombocytopenia- Platelet count 7.  Proceed with 1 unit platelets today.  4. Neutropenia- ANC low at 100. Increased risk of infection with Vidaza. Reviewed neutropenic precautions. Continue prophylactic acyclovir and levaquin.   5. Low back pain- nonmalignant. Avoid NSAIDs in setting of anemia. Continue oxycodone as prescribed.   6. [place]- s/p thermal ablation. Continue to monitor with scans in the future.   7. Bowel prophylaxis- continue mirlax +/- senna to achieve one bm per day without pain or straining  8. Covid 19 Prophylaxis-received evusheld.   9. Leg swelling- bilateral. Suspect dependent edema. Recommend compression garments.   10. Elevated bilirubin-bilirubin is 2.2 today. Stable.   11. Insomnia- Has tried melatonin without relief. Would like something stronger. RX trazodone 50 mg sent to [facility].   Plan:  Proceed with 1 unit PRBC and 1 unit platelets. Follow up with Dr. Janese Banks (6/13) as scheduled.  New prescription for trazodone sent to pharmacy.  Visit Diagnosis 1. MDS (myelodysplastic syndrome) ([place])   2. Symptomatic anemia   3. Thrombocytopenia (HCC)    Greater than 50% was spent in counseling and coordination of care with this patient including but not limited to discussion of the relevant topics above (See A&P) including, but not limited to diagnosis and management of acute and chronic medical conditions.   Faythe Casa, NP 06/12/2020 1:24 PM

## 2020-06-12 NOTE — Progress Notes (Signed)
Patient states he is having trouble sleeping and would like help with his sleep.

## 2020-06-13 LAB — BPAM RBC
Blood Product Expiration Date: 202206262359
ISSUE DATE / TIME: 202206091146
Unit Type and Rh: 6200

## 2020-06-13 LAB — PREPARE PLATELET PHERESIS: Unit division: 0

## 2020-06-13 LAB — TYPE AND SCREEN
ABO/RH(D): A POS
Antibody Screen: NEGATIVE
Unit division: 0

## 2020-06-13 LAB — BPAM PLATELET PHERESIS
Blood Product Expiration Date: 202206122359
ISSUE DATE / TIME: 202206091050
Unit Type and Rh: 9500

## 2020-06-15 NOTE — Progress Notes (Signed)
Hematology/Oncology Consult note [facility]  Telephone:([phone] Fax:([phone]  Patient Care Team: Danelle Berry, NP as PCP - General (Nurse Practitioner) Dionisio David, MD (Cardiology) Sindy Guadeloupe, MD as Consulting Physician (Hematology and Oncology)   Name of the patient: Timothy Murray  [id number]  Jan 18, 1945   Date of visit: 06/16/20  Diagnosis- MDS with 5q del but high risk given TP53 mutation  HCC s/p thermal ablation  Chief complaint/ Reason for visit-routine follow-up of MDS  Heme/Onc history: Patient is a 75 year old male with a history of cirrhosis and hepatocellular carcinoma for which she had proton beam radiation In June 2021.  He has baseline thrombocytopenia which was initially attributed to his cirrhosis.  He was found to have progressive pancytopenia with a steady decline in his white cell count since March 2022 when it was down to 3.4 with a platelet count of 53.  He was hospitalized sometime in April 2022 with acute biliary gallstone pancreatitis .  He underwent ERCP inpatient with platelet transfusion which showed sludge and biliary sphincterotomy was performed.  His bilirubin which was elevated up to 7.2 has come down to 2.4 presently.   However his pancytopenia has continued to worsen and presently white count is 1.1 with an H&H of 6.2/17.6 with an MCV of 102 and a platelet count of 33.Bone marrow biopsy showed evidence of dyspoietic changes in the erythroid and megakaryocyte excel lines associated with ring sideroblasts.  Definite increase in blastic cells not identified.  Overall features favor myelodysplastic syndrome.  Karyotype was normal but only for metaphase cells were analyzed which were not sufficient.  [facility] FISH MDS panel showed deletion of 5 q. along with monosomy 7 and deletion 7 q. without excess blasts.  SF3B1 mutation negative.  T p53 positive   With regards to his hepatocellular carcinoma patient had an  MRI abdomen on 04/24/2020 which showed that his primary liver mass which was previously 4.3 x 3.6 cmWas similar in size at 4.5 x 3.5 cm.   Interval history-patient returns to clinic for evaluation and consideration of a blood and platelet transfusion.  He completed cycle 1 5/24-5/31 of Vidaza.  He reports feeling some better.  He has been receiving intermittent blood and platelet transfusions. He received 1 unit PRBC and 1 unit platelet transfusion on 06/12/2020. Insomnia has improved with trazadone.  He has been able to sleep 6 to 7 hours at a time.  Energy levels have improved some.  Continues compression stockings on his lower extremities.  Denies any bleeding.   ECOG PS- 2 Pain scale- 0   Review of systems- Review of Systems  Constitutional:  Positive for malaise/fatigue. Negative for chills, fever and weight loss.  HENT:  Negative for congestion, ear pain and tinnitus.   Eyes: Negative.  Negative for blurred vision and double vision.  Respiratory:  Positive for shortness of breath. Negative for cough and sputum production.   Cardiovascular: Negative.  Negative for chest pain, palpitations and leg swelling.  Gastrointestinal: Negative.  Negative for abdominal pain, constipation, diarrhea, nausea and vomiting.  Genitourinary:  Negative for dysuria, frequency and urgency.  Musculoskeletal:  Negative for back pain and falls.  Skin: Negative.  Negative for rash.  Neurological:  Positive for weakness. Negative for headaches.  Endo/Heme/Allergies: Negative.  Does not bruise/bleed easily.  Psychiatric/Behavioral:  Negative for depression. The patient is not nervous/anxious and does not have insomnia.      Allergies  Allergen Reactions   Soap & Cleansers     "  liquid soap" antibacterial -rash   Latex Rash and Itching     Past Medical History:  Diagnosis Date   Arthritis    "lower back" (10/24/2012)   Coronary artery disease    GERD (gastroesophageal reflux disease)    High cholesterol     Hypertension    Liver cancer ([place])    MDS (myelodysplastic syndrome) ([place])    Type II diabetes mellitus ([place])      Past Surgical History:  Procedure Laterality Date   APPENDECTOMY  2002   CARDIAC CATHETERIZATION  10/24/2012   CORONARY ARTERY BYPASS GRAFT N/A 10/26/2012   Procedure: CORONARY ARTERY BYPASS GRAFTING (CABG);  Surgeon: Melrose Nakayama, MD;  Location: [place];  Service: Open Heart Surgery;  Laterality: N/A;  Times 3 using left internal mammary artery and endoscopically harvested right saphenous vein   ERCP N/A 04/25/2020   Procedure: ENDOSCOPIC RETROGRADE CHOLANGIOPANCREATOGRAPHY (ERCP);  Surgeon: Lucilla Lame, MD;  Location: [facility] ENDOSCOPY;  Service: Endoscopy;  Laterality: N/A;    Social History   Socioeconomic History   Marital status: Married    Spouse name: Not on file   Number of children: Not on file   Years of education: Not on file   Highest education level: Not on file  Occupational History   Not on file  Tobacco Use   Smoking status: Former    Packs/day: 0.50    Years: 30.00    Pack years: 15.00    Types: Cigarettes    Quit date: 08/28/2001    Years since quitting: 18.8   Smokeless tobacco: Never  Vaping Use   Vaping Use: Not on file  Substance and Sexual Activity   Alcohol use: No    Alcohol/week: 0.0 standard drinks    Comment: 10/24/2012 "stopped drinking alcohol in 2003; used to drink ~ 9 bottles beer/wk"   Drug use: No   Sexual activity: Yes  Other Topics Concern   Not on file  Social History Narrative   Not on file   Social Determinants of Health   Financial Resource Strain: Not on file  Food Insecurity: Not on file  Transportation Needs: Not on file  Physical Activity: Not on file  Stress: Not on file  Social Connections: Not on file  Intimate Partner Violence: Not on file    Family History  Problem Relation Age of Onset   Cancer Sister      Current Outpatient Medications:    acyclovir (ZOVIRAX) 400 MG tablet, Take 1  tablet (400 mg total) by mouth 2 (two) times daily., Disp: 60 tablet, Rfl: 1   amoxicillin-clavulanate (AUGMENTIN) 500-125 MG tablet, amoxicillin 500 mg-potassium clavulanate 125 mg tablet  TAKE 1 TABLET BY MOUTH TWICE DAILY X 7 DAYS WITH FOOD, Disp: , Rfl:    atorvastatin (LIPITOR) 40 MG tablet, Take 1 tablet by mouth daily., Disp: , Rfl:    azithromycin (ZITHROMAX) 500 MG tablet, azithromycin 500 mg tablet  TAKE 1 TABLET BY MOUTH DAILY X 7 DAYS WITH FOOD, Disp: , Rfl:    furosemide (LASIX) 20 MG tablet, Take 20 mg by mouth daily., Disp: , Rfl:    glimepiride (AMARYL) 1 MG tablet, Take 1 mg by mouth daily., Disp: , Rfl:    glucosamine-chondroitin 500-400 MG tablet, Take 1 tablet by mouth daily., Disp: , Rfl:    losartan (COZAAR) 25 MG tablet, Take 25 mg by mouth daily., Disp: , Rfl:    metoprolol tartrate (LOPRESSOR) 25 MG tablet, Take 25 mg by mouth daily., Disp: ,  Rfl:    nitroGLYCERIN (NITROSTAT) 0.3 MG SL tablet, nitroglycerin 0.3 mg sublingual tablet  PLACE ONE TABLET (0.3 MG DOSE) UNDER THE TONGUE EVERY 5 (FIVE) MINUTES AS NEEDED FOR CHEST PAIN., Disp: , Rfl:    omeprazole (PRILOSEC) 20 MG capsule, Take 40 mg by mouth every morning., Disp: , Rfl:    oxyCODONE (OXY IR/ROXICODONE) 5 MG immediate release tablet, Take 1 tablet (5 mg total) by mouth every 8 (eight) hours as needed for severe pain., Disp: 90 tablet, Rfl: 0   oxyCODONE-acetaminophen (PERCOCET/ROXICET) 5-325 MG tablet, oxycodone-acetaminophen 5 mg-325 mg tablet  TAKE 1 TABLET BY MOUTH EVERY 4 HOURS AS NEEDED FOR SEVERE PAIN., Disp: , Rfl:    traZODone (DESYREL) 50 MG tablet, Take 1-2 tablets (50-100 mg total) by mouth at bedtime., Disp: 90 tablet, Rfl: 0   ciprofloxacin (CIPRO) 500 MG tablet, ciprofloxacin 500 mg tablet  TAKE 1 TABLET BY MOUTH TWICE DAILY FOR 10 DAYS WITH FOOD (Patient not taking: Reported on 06/16/2020), Disp: , Rfl:    levofloxacin (LEVAQUIN) 500 MG tablet, Take 1 tablet (500 mg total) by mouth daily. (Patient not  taking: Reported on 06/16/2020), Disp: 30 tablet, Rfl: 0   lidocaine-prilocaine (EMLA) cream, Apply to affected area once (Patient not taking: No sig reported), Disp: 30 g, Rfl: 3   loratadine (CLARITIN) 10 MG tablet, Take 5 mg by mouth daily as needed. Take half a tablet (25m) daily (Patient not taking: Reported on 06/16/2020), Disp: , Rfl:    LORazepam (ATIVAN) 0.5 MG tablet, Take 1 tablet (0.5 mg total) by mouth every 6 (six) hours as needed (Nausea or vomiting). (Patient not taking: No sig reported), Disp: 30 tablet, Rfl: 0   ondansetron (ZOFRAN) 8 MG tablet, Take 1 tablet (8 mg total) by mouth 2 (two) times daily as needed (Nausea or vomiting). (Patient not taking: No sig reported), Disp: 30 tablet, Rfl: 1   prochlorperazine (COMPAZINE) 10 MG tablet, Take 1 tablet (10 mg total) by mouth every 6 (six) hours as needed (Nausea or vomiting). (Patient not taking: No sig reported), Disp: 30 tablet, Rfl: 1 No current facility-administered medications for this visit.  Facility-Administered Medications Ordered in Other Visits:    sodium chloride flush (NS) 0.9 % injection 10 mL, 10 mL, Intracatheter, PRN, RSindy Guadeloupe MD  Physical exam:  Vitals:   06/16/20 0839  BP: (!) 110/50  Pulse: 72  Resp: 18  Temp: 99.1 F (37.3 C)  SpO2: 100%  Weight: 199 lb 12.8 oz (90.6 kg)   Physical Exam Constitutional:      Appearance: Normal appearance.  HENT:     Head: Normocephalic and atraumatic.  Eyes:     Pupils: Pupils are equal, round, and reactive to light.  Cardiovascular:     Rate and Rhythm: Normal rate and regular rhythm.     Heart sounds: Normal heart sounds. No murmur heard. Pulmonary:     Effort: Pulmonary effort is normal.     Breath sounds: Normal breath sounds. No wheezing.  Abdominal:     General: Bowel sounds are normal. There is no distension.     Palpations: Abdomen is soft.     Tenderness: There is no abdominal tenderness.  Musculoskeletal:        General: Normal range of  motion.     Cervical back: Normal range of motion.  Skin:    General: Skin is warm and dry.     Findings: No rash.  Neurological:     Mental Status: He is  alert and oriented to person, place, and time.  Psychiatric:        Judgment: Judgment normal.     CMP Latest Ref Rng & Units 06/12/2020  Glucose 70 - 99 mg/dL 182(H)  BUN 8 - 23 mg/dL 16  Creatinine 0.61 - 1.24 mg/dL 0.91  Sodium 135 - 145 mmol/L 139  Potassium 3.5 - 5.1 mmol/L 3.9  Chloride 98 - 111 mmol/L 105  CO2 22 - 32 mmol/L 23  Calcium 8.9 - 10.3 mg/dL 8.6(L)  Total Protein 6.5 - 8.1 g/dL 6.2(L)  Total Bilirubin 0.3 - 1.2 mg/dL 2.2(H)  Alkaline Phos 38 - 126 U/L 82  AST 15 - 41 U/L 24  ALT 0 - 44 U/L 20   CBC Latest Ref Rng & Units 06/16/2020  WBC 4.0 - 10.5 K/uL 0.4(LL)  Hemoglobin 13.0 - 17.0 g/dL 7.0(L)  Hematocrit 39.0 - 52.0 % 19.8(L)  Platelets 150 - 400 K/uL 10(LL)       No results found.   Assessment and plan- Patient is a 75 y.o. male with high risk MDS and T p53 mutation, currently s/p cycle 1 of vidaza, returns to clinic for follow up and possible blood/platelet transfusion.  1. MDS- high risk based on T p53 mutation. Previously on revlimid prior to results showing T p53 mutation. He is currently s/p cycle 1 of Vidaza 75 mg/m2 on days 1-5 of 28 day cycle last given from 5/24-5/31. Plan is to continue treatment until progression or unacceptable toxicity. Treatment given with palliative intent. Continue to hold starting venetoclax given baseline significant pancytopenia and borderline performance status.  Dr. Janese Banks will reconsider in the future.   2. Symptomatic anemia- currently transfusion dependent.  Hemoglobin is 7.0 today.  Proceed with 1 unit packed red blood cells.  3. Thrombocytopenia- Platelet count 10.Marland Kitchen  Proceed with 1 unit platelets today.  4. Neutropenia- ANC low at 0.0. Increased risk of infection with Vidaza. Reviewed neutropenic precautions. Continue prophylactic acyclovir and levaquin.    5. Low back pain- nonmalignant. Avoid NSAIDs in setting of anemia. Continue oxycodone as prescribed.   6. [place]- s/p thermal ablation. Continue to monitor with scans in the future.   7. Bowel prophylaxis- continue mirlax +/- senna to achieve one bm per day without pain or straining  8. Covid 19 Prophylaxis-received evusheld.   9. Leg swelling- bilateral. Suspect dependent edema.  Continue compression garments.   10. Elevated bilirubin-bilirubin is 2.2 at last lab draw. Stable.   11. Insomnia-currently using trazodone 50 mg nightly.  This appears to be helping some.  He is getting 6 to 7 hours per night.  Plan:  Proceed with 1 unit PRBC and 1 unit platelets. RTC on Thursday, 06/19/2020 for lab work and possible blood/platelets. Follow up with Dr. Janese Banks (6/20) as scheduled.   Visit Diagnosis 1. MDS (myelodysplastic syndrome) ([place])   2. Insomnia, unspecified type    Greater than 50% was spent in counseling and coordination of care with this patient including but not limited to discussion of the relevant topics above (See A&P) including, but not limited to diagnosis and management of acute and chronic medical conditions.   Faythe Casa, NP 06/16/2020 12:42 PM

## 2020-06-16 ENCOUNTER — Other Ambulatory Visit: Payer: Self-pay | Admitting: Oncology

## 2020-06-16 ENCOUNTER — Inpatient Hospital Stay: Payer: Medicare HMO

## 2020-06-16 ENCOUNTER — Inpatient Hospital Stay (HOSPITAL_BASED_OUTPATIENT_CLINIC_OR_DEPARTMENT_OTHER): Payer: Medicare HMO | Admitting: Oncology

## 2020-06-16 ENCOUNTER — Encounter: Payer: Self-pay | Admitting: Oncology

## 2020-06-16 VITALS — BP 110/50 | HR 72 | Temp 99.1°F | Resp 18 | Wt 199.8 lb

## 2020-06-16 DIAGNOSIS — Z8505 Personal history of malignant neoplasm of liver: Secondary | ICD-10-CM | POA: Diagnosis not present

## 2020-06-16 DIAGNOSIS — I2510 Atherosclerotic heart disease of native coronary artery without angina pectoris: Secondary | ICD-10-CM | POA: Diagnosis not present

## 2020-06-16 DIAGNOSIS — D469 Myelodysplastic syndrome, unspecified: Secondary | ICD-10-CM

## 2020-06-16 DIAGNOSIS — D696 Thrombocytopenia, unspecified: Secondary | ICD-10-CM

## 2020-06-16 DIAGNOSIS — Z5111 Encounter for antineoplastic chemotherapy: Secondary | ICD-10-CM | POA: Diagnosis not present

## 2020-06-16 DIAGNOSIS — K7460 Unspecified cirrhosis of liver: Secondary | ICD-10-CM | POA: Diagnosis not present

## 2020-06-16 DIAGNOSIS — D649 Anemia, unspecified: Secondary | ICD-10-CM

## 2020-06-16 DIAGNOSIS — Z298 Encounter for other specified prophylactic measures: Secondary | ICD-10-CM | POA: Diagnosis not present

## 2020-06-16 DIAGNOSIS — E119 Type 2 diabetes mellitus without complications: Secondary | ICD-10-CM | POA: Diagnosis not present

## 2020-06-16 DIAGNOSIS — G4700 Insomnia, unspecified: Secondary | ICD-10-CM

## 2020-06-16 DIAGNOSIS — Z9221 Personal history of antineoplastic chemotherapy: Secondary | ICD-10-CM | POA: Diagnosis not present

## 2020-06-16 LAB — SAMPLE TO BLOOD BANK

## 2020-06-16 LAB — CBC WITH DIFFERENTIAL/PLATELET
Abs Immature Granulocytes: 0 10*3/uL (ref 0.00–0.07)
Basophils Absolute: 0 10*3/uL (ref 0.0–0.1)
Basophils Relative: 0 %
Eosinophils Absolute: 0 10*3/uL (ref 0.0–0.5)
Eosinophils Relative: 0 %
HCT: 19.8 % — ABNORMAL LOW (ref 39.0–52.0)
Hemoglobin: 7 g/dL — ABNORMAL LOW (ref 13.0–17.0)
Immature Granulocytes: 0 %
Lymphocytes Relative: 89 %
Lymphs Abs: 0.4 10*3/uL — ABNORMAL LOW (ref 0.7–4.0)
MCH: 34.1 pg — ABNORMAL HIGH (ref 26.0–34.0)
MCHC: 35.4 g/dL (ref 30.0–36.0)
MCV: 96.6 fL (ref 80.0–100.0)
Monocytes Absolute: 0 10*3/uL — ABNORMAL LOW (ref 0.1–1.0)
Monocytes Relative: 2 %
Neutro Abs: 0 10*3/uL — CL (ref 1.7–7.7)
Neutrophils Relative %: 9 %
Platelets: 10 10*3/uL — CL (ref 150–400)
RBC: 2.05 MIL/uL — ABNORMAL LOW (ref 4.22–5.81)
RDW: 18 % — ABNORMAL HIGH (ref 11.5–15.5)
Smear Review: NORMAL
WBC: 0.4 10*3/uL — CL (ref 4.0–10.5)
nRBC: 0 % (ref 0.0–0.2)

## 2020-06-16 LAB — PREPARE RBC (CROSSMATCH)

## 2020-06-16 MED ORDER — SODIUM CHLORIDE 0.9 % IV SOLN
Freq: Once | INTRAVENOUS | Status: AC
Start: 2020-06-16 — End: 2020-06-16
  Filled 2020-06-16: qty 250

## 2020-06-16 MED ORDER — ACETAMINOPHEN 325 MG PO TABS
650.0000 mg | ORAL_TABLET | Freq: Once | ORAL | Status: AC
  Administered 2020-06-16: 650 mg via ORAL
  Filled 2020-06-16: qty 2

## 2020-06-16 MED ORDER — SODIUM CHLORIDE 0.9% FLUSH
10.0000 mL | INTRAVENOUS | Status: DC | PRN
Start: 2020-06-16 — End: 2020-06-16
  Filled 2020-06-16: qty 10

## 2020-06-16 MED ORDER — SODIUM CHLORIDE 0.9% IV SOLUTION
250.0000 mL | Freq: Once | INTRAVENOUS | Status: AC
  Administered 2020-06-16: 250 mL via INTRAVENOUS
  Filled 2020-06-16: qty 250

## 2020-06-16 NOTE — Progress Notes (Signed)
Pt will like to know about his port flushes, as well as does he need blood work prior to his MRI on July 26th. Pt states the swelling in his feet is improving but it does have a tight feeling.

## 2020-06-17 LAB — BPAM RBC
Blood Product Expiration Date: 202206222359
ISSUE DATE / TIME: 202206131137
Unit Type and Rh: 600

## 2020-06-17 LAB — TYPE AND SCREEN
ABO/RH(D): A POS
Antibody Screen: NEGATIVE
Unit division: 0

## 2020-06-17 LAB — PREPARE PLATELET PHERESIS: Unit division: 0

## 2020-06-17 LAB — BPAM PLATELET PHERESIS
Blood Product Expiration Date: 202206162359
ISSUE DATE / TIME: 202206131023
Unit Type and Rh: 5100

## 2020-06-19 ENCOUNTER — Inpatient Hospital Stay: Payer: Medicare HMO

## 2020-06-19 DIAGNOSIS — Z5111 Encounter for antineoplastic chemotherapy: Secondary | ICD-10-CM | POA: Diagnosis not present

## 2020-06-19 DIAGNOSIS — Z9221 Personal history of antineoplastic chemotherapy: Secondary | ICD-10-CM | POA: Diagnosis not present

## 2020-06-19 DIAGNOSIS — G4700 Insomnia, unspecified: Secondary | ICD-10-CM | POA: Diagnosis not present

## 2020-06-19 DIAGNOSIS — Z8505 Personal history of malignant neoplasm of liver: Secondary | ICD-10-CM | POA: Diagnosis not present

## 2020-06-19 DIAGNOSIS — D469 Myelodysplastic syndrome, unspecified: Secondary | ICD-10-CM

## 2020-06-19 DIAGNOSIS — E119 Type 2 diabetes mellitus without complications: Secondary | ICD-10-CM | POA: Diagnosis not present

## 2020-06-19 DIAGNOSIS — Z298 Encounter for other specified prophylactic measures: Secondary | ICD-10-CM | POA: Diagnosis not present

## 2020-06-19 DIAGNOSIS — I2510 Atherosclerotic heart disease of native coronary artery without angina pectoris: Secondary | ICD-10-CM | POA: Diagnosis not present

## 2020-06-19 DIAGNOSIS — K7460 Unspecified cirrhosis of liver: Secondary | ICD-10-CM | POA: Diagnosis not present

## 2020-06-19 LAB — COMPREHENSIVE METABOLIC PANEL
ALT: 20 U/L (ref 0–44)
AST: 24 U/L (ref 15–41)
Albumin: 3.8 g/dL (ref 3.5–5.0)
Alkaline Phosphatase: 83 U/L (ref 38–126)
Anion gap: 8 (ref 5–15)
BUN: 17 mg/dL (ref 8–23)
CO2: 25 mmol/L (ref 22–32)
Calcium: 8.6 mg/dL — ABNORMAL LOW (ref 8.9–10.3)
Chloride: 104 mmol/L (ref 98–111)
Creatinine, Ser: 0.85 mg/dL (ref 0.61–1.24)
GFR, Estimated: >60 mL/min (ref >60)
Glucose, Bld: 185 mg/dL — ABNORMAL HIGH (ref 70–99)
Potassium: 4 mmol/L (ref 3.5–5.1)
Sodium: 137 mmol/L (ref 135–145)
Total Bilirubin: 2.6 mg/dL — ABNORMAL HIGH (ref 0.3–1.2)
Total Protein: 6.4 g/dL — ABNORMAL LOW (ref 6.5–8.1)

## 2020-06-19 LAB — CBC WITH DIFFERENTIAL/PLATELET
Abs Immature Granulocytes: 0 10*3/uL (ref 0.00–0.07)
Basophils Absolute: 0 10*3/uL (ref 0.0–0.1)
Basophils Relative: 2 %
Eosinophils Absolute: 0 10*3/uL (ref 0.0–0.5)
Eosinophils Relative: 0 %
HCT: 22.6 % — ABNORMAL LOW (ref 39.0–52.0)
Hemoglobin: 7.8 g/dL — ABNORMAL LOW (ref 13.0–17.0)
Immature Granulocytes: 0 %
Lymphocytes Relative: 92 %
Lymphs Abs: 0.5 10*3/uL — ABNORMAL LOW (ref 0.7–4.0)
MCH: 33.5 pg (ref 26.0–34.0)
MCHC: 34.5 g/dL (ref 30.0–36.0)
MCV: 97 fL (ref 80.0–100.0)
Monocytes Absolute: 0 10*3/uL — ABNORMAL LOW (ref 0.1–1.0)
Monocytes Relative: 2 %
Neutro Abs: 0 10*3/uL — CL (ref 1.7–7.7)
Neutrophils Relative %: 4 %
Platelets: 15 10*3/uL — CL (ref 150–400)
RBC: 2.33 MIL/uL — ABNORMAL LOW (ref 4.22–5.81)
RDW: 17.6 % — ABNORMAL HIGH (ref 11.5–15.5)
Smear Review: NORMAL
WBC: 0.5 10*3/uL — CL (ref 4.0–10.5)
nRBC: 0 % (ref 0.0–0.2)

## 2020-06-19 LAB — SAMPLE TO BLOOD BANK

## 2020-06-19 NOTE — Progress Notes (Signed)
Hgb 7.8 and Plt 15K.  No blood or platelet transfusion today per Moishe Spice RN

## 2020-06-22 ENCOUNTER — Other Ambulatory Visit: Payer: Self-pay | Admitting: *Deleted

## 2020-06-22 DIAGNOSIS — D649 Anemia, unspecified: Secondary | ICD-10-CM

## 2020-06-22 DIAGNOSIS — D469 Myelodysplastic syndrome, unspecified: Secondary | ICD-10-CM

## 2020-06-22 DIAGNOSIS — D696 Thrombocytopenia, unspecified: Secondary | ICD-10-CM

## 2020-06-23 ENCOUNTER — Inpatient Hospital Stay: Payer: Medicare HMO | Admitting: Oncology

## 2020-06-23 ENCOUNTER — Ambulatory Visit: Payer: Medicare HMO

## 2020-06-23 ENCOUNTER — Ambulatory Visit: Payer: Medicare HMO | Admitting: Oncology

## 2020-06-23 ENCOUNTER — Other Ambulatory Visit: Payer: Medicare HMO

## 2020-06-23 ENCOUNTER — Encounter: Payer: Self-pay | Admitting: Oncology

## 2020-06-23 ENCOUNTER — Inpatient Hospital Stay: Payer: Medicare HMO

## 2020-06-23 VITALS — BP 101/50 | HR 77 | Temp 98.2°F | Resp 16 | Ht 72.0 in | Wt 198.3 lb

## 2020-06-23 DIAGNOSIS — D709 Neutropenia, unspecified: Secondary | ICD-10-CM

## 2020-06-23 DIAGNOSIS — D649 Anemia, unspecified: Secondary | ICD-10-CM

## 2020-06-23 DIAGNOSIS — G4700 Insomnia, unspecified: Secondary | ICD-10-CM | POA: Diagnosis not present

## 2020-06-23 DIAGNOSIS — I2510 Atherosclerotic heart disease of native coronary artery without angina pectoris: Secondary | ICD-10-CM | POA: Diagnosis not present

## 2020-06-23 DIAGNOSIS — E119 Type 2 diabetes mellitus without complications: Secondary | ICD-10-CM | POA: Diagnosis not present

## 2020-06-23 DIAGNOSIS — D61818 Other pancytopenia: Secondary | ICD-10-CM | POA: Diagnosis not present

## 2020-06-23 DIAGNOSIS — Z5111 Encounter for antineoplastic chemotherapy: Secondary | ICD-10-CM | POA: Diagnosis not present

## 2020-06-23 DIAGNOSIS — D469 Myelodysplastic syndrome, unspecified: Secondary | ICD-10-CM

## 2020-06-23 DIAGNOSIS — Z298 Encounter for other specified prophylactic measures: Secondary | ICD-10-CM | POA: Diagnosis not present

## 2020-06-23 DIAGNOSIS — Z8505 Personal history of malignant neoplasm of liver: Secondary | ICD-10-CM | POA: Diagnosis not present

## 2020-06-23 DIAGNOSIS — D696 Thrombocytopenia, unspecified: Secondary | ICD-10-CM

## 2020-06-23 DIAGNOSIS — K7460 Unspecified cirrhosis of liver: Secondary | ICD-10-CM | POA: Diagnosis not present

## 2020-06-23 DIAGNOSIS — Z9221 Personal history of antineoplastic chemotherapy: Secondary | ICD-10-CM | POA: Diagnosis not present

## 2020-06-23 LAB — CBC WITH DIFFERENTIAL/PLATELET
Abs Immature Granulocytes: 0.01 10*3/uL (ref 0.00–0.07)
Basophils Absolute: 0 10*3/uL (ref 0.0–0.1)
Basophils Relative: 2 %
Eosinophils Absolute: 0 10*3/uL (ref 0.0–0.5)
Eosinophils Relative: 0 %
HCT: 19.7 % — ABNORMAL LOW (ref 39.0–52.0)
Hemoglobin: 6.8 g/dL — ABNORMAL LOW (ref 13.0–17.0)
Immature Granulocytes: 2 %
Lymphocytes Relative: 86 %
Lymphs Abs: 0.5 10*3/uL — ABNORMAL LOW (ref 0.7–4.0)
MCH: 33.2 pg (ref 26.0–34.0)
MCHC: 34.5 g/dL (ref 30.0–36.0)
MCV: 96.1 fL (ref 80.0–100.0)
Monocytes Absolute: 0 10*3/uL — ABNORMAL LOW (ref 0.1–1.0)
Monocytes Relative: 6 %
Neutro Abs: 0 10*3/uL — CL (ref 1.7–7.7)
Neutrophils Relative %: 4 %
Platelets: 23 10*3/uL — CL (ref 150–400)
RBC: 2.05 MIL/uL — ABNORMAL LOW (ref 4.22–5.81)
RDW: 18.4 % — ABNORMAL HIGH (ref 11.5–15.5)
WBC: 0.5 10*3/uL — CL (ref 4.0–10.5)
nRBC: 0 % (ref 0.0–0.2)

## 2020-06-23 LAB — COMPREHENSIVE METABOLIC PANEL
ALT: 20 U/L (ref 0–44)
AST: 20 U/L (ref 15–41)
Albumin: 3.7 g/dL (ref 3.5–5.0)
Alkaline Phosphatase: 81 U/L (ref 38–126)
Anion gap: 7 (ref 5–15)
BUN: 19 mg/dL (ref 8–23)
CO2: 25 mmol/L (ref 22–32)
Calcium: 8.3 mg/dL — ABNORMAL LOW (ref 8.9–10.3)
Chloride: 100 mmol/L (ref 98–111)
Creatinine, Ser: 1.12 mg/dL (ref 0.61–1.24)
GFR, Estimated: >60 mL/min (ref >60)
Glucose, Bld: 151 mg/dL — ABNORMAL HIGH (ref 70–99)
Potassium: 4 mmol/L (ref 3.5–5.1)
Sodium: 132 mmol/L — ABNORMAL LOW (ref 135–145)
Total Bilirubin: 2.1 mg/dL — ABNORMAL HIGH (ref 0.3–1.2)
Total Protein: 6.3 g/dL — ABNORMAL LOW (ref 6.5–8.1)

## 2020-06-23 LAB — SAMPLE TO BLOOD BANK

## 2020-06-23 MED ORDER — ONDANSETRON HCL 4 MG PO TABS
8.0000 mg | ORAL_TABLET | Freq: Once | ORAL | Status: AC
  Administered 2020-06-23: 8 mg via ORAL
  Filled 2020-06-23: qty 2

## 2020-06-23 MED ORDER — LEVOFLOXACIN 500 MG PO TABS: 500.0000 mg | ORAL_TABLET | Freq: Every day | ORAL | 0 refills | Status: DC

## 2020-06-23 MED ORDER — AZACITIDINE CHEMO SQ INJECTION
75.0000 mg/m2 | Freq: Once | INTRAMUSCULAR | Status: AC
  Administered 2020-06-23: 162.5 mg via SUBCUTANEOUS
  Filled 2020-06-23: qty 6.5

## 2020-06-23 NOTE — Progress Notes (Signed)
Pt tired, feet are cold and stinging at night but has med for sleep and now it does not bother him as much. Decreased appetite

## 2020-06-23 NOTE — Patient Instructions (Signed)
[facility]COLOG[facility]  Discharge Instructions: Thank you for choosing [facility] to provide your oncology and hematology care.  If you have a lab appointment with the [facility], please go directly to the [facility] and check in at the registration area.  Wear comfortable clothing and clothing appropriate for easy access to any Portacath or PICC line.   We strive to give you quality time with your provider. You may need to reschedule your appointment if you arrive late (15 or more minutes).  Arriving late affects you and other patients whose appointments are after yours.  Also, if you miss three or more appointments without notifying the office, you may be dismissed from the clinic at the provider's discretion.      For prescription refill requests, have your pharmacy contact our office and allow 72 hours for refills to be completed.    Today you received the following chemotherapy and/or immunotherapy agents Vidaza      To help prevent nausea and vomiting after your treatment, we encourage you to take your nausea medication as directed.  BELOW ARE SYMPTOMS THAT SHOULD BE REPORTED IMMEDIATELY: *FEVER GREATER THAN 100.4 F (38 C) OR HIGHER *CHILLS OR SWEATING *NAUSEA AND VOMITING THAT IS NOT CONTROLLED WITH YOUR NAUSEA MEDICATION *UNUSUAL SHORTNESS OF BREATH *UNUSUAL BRUISING OR BLEEDING *URINARY PROBLEMS (pain or burning when urinating, or frequent urination) *BOWEL PROBLEMS (unusual diarrhea, constipation, pain near the anus) TENDERNESS IN MOUTH AND THROAT WITH OR WITHOUT PRESENCE OF ULCERS (sore throat, sores in mouth, or a toothache) UNUSUAL RASH, SWELLING OR PAIN  UNUSUAL VAGINAL DISCHARGE OR ITCHING   Items with * indicate a potential emergency and should be followed up as soon as possible or go to the Emergency Department if any problems should occur.  Please show the CHEMOTHERAPY ALERT CARD or IMMUNOTHERAPY ALERT CARD at check-in to  the Emergency Department and triage nurse.  Should you have questions after your visit or need to cancel or reschedule your appointment, please contact [facility]  [phone] and follow the prompts.  Office hours are 8:00 a.m. to 4:30 p.m. Monday - Friday. Please note that voicemails left after 4:00 p.m. may not be returned until the following business day.  We are closed weekends and major holidays. You have access to a nurse at all times for urgent questions. Please call the main number to the clinic [phone] and follow the prompts.  For any non-urgent questions, you may also contact your provider using MyChart. We now offer e-Visits for anyone 3 and older to request care online for non-urgent symptoms. For details visit [email or web address].   Also download the MyChart app! Go to the app store, search "MyChart", open the app, select [facility], and log in with your MyChart username and password.  Due to Covid, a mask is required upon entering the hospital/clinic. If you do not have a mask, one will be given to you upon arrival. For doctor visits, patients may have 1 support person aged 64 or older with them. For treatment visits, patients cannot have anyone with them due to current Covid guidelines and our immunocompromised population.

## 2020-06-24 ENCOUNTER — Encounter: Payer: Self-pay | Admitting: Oncology

## 2020-06-24 ENCOUNTER — Other Ambulatory Visit: Payer: Self-pay | Admitting: Oncology

## 2020-06-24 ENCOUNTER — Ambulatory Visit: Payer: Medicare HMO

## 2020-06-24 ENCOUNTER — Inpatient Hospital Stay: Payer: Medicare HMO

## 2020-06-24 VITALS — BP 94/55 | HR 60 | Temp 97.7°F | Resp 16 | Wt 198.0 lb

## 2020-06-24 DIAGNOSIS — G4700 Insomnia, unspecified: Secondary | ICD-10-CM | POA: Diagnosis not present

## 2020-06-24 DIAGNOSIS — Z298 Encounter for other specified prophylactic measures: Secondary | ICD-10-CM | POA: Diagnosis not present

## 2020-06-24 DIAGNOSIS — D649 Anemia, unspecified: Secondary | ICD-10-CM

## 2020-06-24 DIAGNOSIS — Z9221 Personal history of antineoplastic chemotherapy: Secondary | ICD-10-CM | POA: Diagnosis not present

## 2020-06-24 DIAGNOSIS — E119 Type 2 diabetes mellitus without complications: Secondary | ICD-10-CM | POA: Diagnosis not present

## 2020-06-24 DIAGNOSIS — D469 Myelodysplastic syndrome, unspecified: Secondary | ICD-10-CM

## 2020-06-24 DIAGNOSIS — I2510 Atherosclerotic heart disease of native coronary artery without angina pectoris: Secondary | ICD-10-CM | POA: Diagnosis not present

## 2020-06-24 DIAGNOSIS — K7460 Unspecified cirrhosis of liver: Secondary | ICD-10-CM | POA: Diagnosis not present

## 2020-06-24 DIAGNOSIS — Z8505 Personal history of malignant neoplasm of liver: Secondary | ICD-10-CM | POA: Diagnosis not present

## 2020-06-24 DIAGNOSIS — Z5111 Encounter for antineoplastic chemotherapy: Secondary | ICD-10-CM | POA: Diagnosis not present

## 2020-06-24 LAB — PREPARE RBC (CROSSMATCH)

## 2020-06-24 LAB — SAMPLE TO BLOOD BANK

## 2020-06-24 MED ORDER — SODIUM CHLORIDE 0.9% IV SOLUTION
250.0000 mL | Freq: Once | INTRAVENOUS | Status: AC
Start: 2020-06-24 — End: 2020-06-24
  Administered 2020-06-24: 250 mL via INTRAVENOUS
  Filled 2020-06-24: qty 250

## 2020-06-24 MED ORDER — ACETAMINOPHEN 325 MG PO TABS
650.0000 mg | ORAL_TABLET | Freq: Once | ORAL | Status: AC
  Administered 2020-06-24: 650 mg via ORAL
  Filled 2020-06-24: qty 2

## 2020-06-24 MED ORDER — AZACITIDINE CHEMO SQ INJECTION
75.0000 mg/m2 | Freq: Once | INTRAMUSCULAR | Status: AC
  Administered 2020-06-24: 162.5 mg via SUBCUTANEOUS
  Filled 2020-06-24: qty 6.5

## 2020-06-24 MED ORDER — ONDANSETRON HCL 4 MG PO TABS
8.0000 mg | ORAL_TABLET | Freq: Once | ORAL | Status: AC
  Administered 2020-06-24: 8 mg via ORAL
  Filled 2020-06-24: qty 2

## 2020-06-24 NOTE — Patient Instructions (Signed)
[facility] ONCOLOGY  Discharge Instructions: Thank you for choosing [facility] to provide your oncology and hematology care.  If you have a lab appointment with the [facility], please go directly to the [facility] and check in at the registration area.  Wear comfortable clothing and clothing appropriate for easy access to any Portacath or PICC line.   We strive to give you quality time with your provider. You may need to reschedule your appointment if you arrive late (15 or more minutes).  Arriving late affects you and other patients whose appointments are after yours.  Also, if you miss three or more appointments without notifying the office, you may be dismissed from the clinic at the provider's discretion.      For prescription refill requests, have your pharmacy contact our office and allow 72 hours for refills to be completed.    Today you received the following chemotherapy and/or immunotherapy agents : Vidaza   To help prevent nausea and vomiting after your treatment, we encourage you to take your nausea medication as directed.  BELOW ARE SYMPTOMS THAT SHOULD BE REPORTED IMMEDIATELY: *FEVER GREATER THAN 100.4 F (38 C) OR HIGHER *CHILLS OR SWEATING *NAUSEA AND VOMITING THAT IS NOT CONTROLLED WITH YOUR NAUSEA MEDICATION *UNUSUAL SHORTNESS OF BREATH *UNUSUAL BRUISING OR BLEEDING *URINARY PROBLEMS (pain or burning when urinating, or frequent urination) *BOWEL PROBLEMS (unusual diarrhea, constipation, pain near the anus) TENDERNESS IN MOUTH AND THROAT WITH OR WITHOUT PRESENCE OF ULCERS (sore throat, sores in mouth, or a toothache) UNUSUAL RASH, SWELLING OR PAIN  UNUSUAL VAGINAL DISCHARGE OR ITCHING   Items with * indicate a potential emergency and should be followed up as soon as possible or go to the Emergency Department if any problems should occur.  Please show the CHEMOTHERAPY ALERT CARD or IMMUNOTHERAPY ALERT CARD at check-in to  the Emergency Department and triage nurse.  Should you have questions after your visit or need to cancel or reschedule your appointment, please contact [facility]  [phone] and follow the prompts.  Office hours are 8:00 a.m. to 4:30 p.m. Monday - Friday. Please note that voicemails left after 4:00 p.m. may not be returned until the following business day.  We are closed weekends and major holidays. You have access to a nurse at all times for urgent questions. Please call the main number to the clinic [phone] and follow the prompts.  For any non-urgent questions, you may also contact your provider using MyChart. We now offer e-Visits for anyone 85 and older to request care online for non-urgent symptoms. For details visit [email or web address].   Also download the MyChart app! Go to the app store, search "MyChart", open the app, select [facility], and log in with your MyChart username and password.  Due to Covid, a mask is required upon entering the hospital/clinic. If you do not have a mask, one will be given to you upon arrival. For doctor visits, patients may have 1 support person aged 71 or older with them. For treatment visits, patients cannot have anyone with them due to current Covid guidelines and our immunocompromised population.

## 2020-06-24 NOTE — Progress Notes (Signed)
Pt here for vidaza / 1 unit PRBC... pt did not have blue blood band on .  States he threw away. Ok per MD to re-draw hold tube today and pt to return tomorrow for blood / vidaza. Pt advised to keep blood band on x 72 hrs. Pt verbalized orders . MD aware that needs new orders for tomorrow.

## 2020-06-24 NOTE — Progress Notes (Addendum)
Hematology/Oncology Consult note [facility]  Telephone:([phone] [phone]  Patient Care Team: Danelle Berry, NP as PCP - General (Nurse Practitioner) Dionisio David, MD (Cardiology) Sindy Guadeloupe, MD as Consulting Physician (Hematology and Oncology)   Name of the patient: Timothy Murray  [id number]  08-Apr-1945   Date of visit: 06/24/20  Diagnosis- 1. MDS with 5q del but high risk given TP53 mutation 2. [place] s/p thermal ablation  Chief complaint/ Reason for visit-on treatment assessment prior to cycle 2-day 1 of Vidaza  Heme/Onc history: Patient is a 75 year old male with a history of cirrhosis and hepatocellular carcinoma for which she had proton beam radiation In June 2021.  He has baseline thrombocytopenia which was initially attributed to his cirrhosis.  He was found to have progressive pancytopenia with a steady decline in his white cell count since March 2022 when it was down to 3.4 with a platelet count of 53.  He was hospitalized sometime in April 2022 with acute biliary gallstone pancreatitis .  He underwent ERCP inpatient with platelet transfusion which showed sludge and biliary sphincterotomy was performed.  His bilirubin which was elevated up to 7.2 has come down to 2.4 presently.   However his pancytopenia has continued to worsen and presently white count is 1.1 with an H&H of 6.2/17.6 with an MCV of 102 and a platelet count of 33.Bone marrow biopsy showed evidence of dyspoietic changes in the erythroid and megakaryocyte excel lines associated with ring sideroblasts.  Definite increase in blastic cells not identified.  Overall features favor myelodysplastic syndrome.  Karyotype was normal but only for metaphase cells were analyzed which were not sufficient.  [facility] FISH MDS panel showed deletion of 5 q. along with monosomy 7 and deletion 7 q. without excess blasts.  SF3B1 mutation negative.   Peripheral blood intellegin myeloid  panel showed :   Detected     Change       Frequency (%) Impact  TP53   c.716A>C     p.Asn239Thr  19            Tier I  U2AF1  c.470A>C     p.Gln157Pro  17            Tier I  IDH1   c.315C>T     p.Gly105Gly  56            Tier II    With regards to his hepatocellular carcinoma patient had an MRI abdomen on 04/24/2020 which showed that his primary liver mass which was previously 4.3 x 3.6 cmWas similar in size at 4.5 x 3.5 cm.   Interval history-patient remains independent of his ADLs at home but finds it difficult to walk due to his bilateral lower extremity swelling.  He has not had any fevers or infections.  Has baseline fatigue  ECOG PS- 2 Pain scale- 0 Opioid associated constipation- no  Review of systems- Review of Systems  Constitutional:  Positive for malaise/fatigue. Negative for chills, fever and weight loss.  HENT:  Negative for congestion, ear discharge and nosebleeds.   Eyes:  Negative for blurred vision.  Respiratory:  Negative for cough, hemoptysis, sputum production, shortness of breath and wheezing.   Cardiovascular:  Negative for chest pain, palpitations, orthopnea and claudication.  Gastrointestinal:  Negative for abdominal pain, blood in stool, constipation, diarrhea, heartburn, melena, nausea and vomiting.  Genitourinary:  Negative for dysuria, flank pain, frequency, hematuria and urgency.  Musculoskeletal:  Negative for back pain,  joint pain and myalgias.  Skin:  Negative for rash.  Neurological:  Negative for dizziness, tingling, focal weakness, seizures, weakness and headaches.  Endo/Heme/Allergies:  Does not bruise/bleed easily.  Psychiatric/Behavioral:  Negative for depression and suicidal ideas. The patient does not have insomnia.       Allergies  Allergen Reactions   Dome-Paste Bandage [Wound Dressings]     All bandaids cause a rash   Soap & Cleansers     "liquid soap" antibacterial -rash   Latex Rash and Itching     Past Medical History:  Diagnosis  Date   Arthritis    "lower back" (10/24/2012)   Coronary artery disease    GERD (gastroesophageal reflux disease)    High cholesterol    Hypertension    Liver cancer ([place])    MDS (myelodysplastic syndrome) ([place])    Type II diabetes mellitus ([place])      Past Surgical History:  Procedure Laterality Date   APPENDECTOMY  2002   CARDIAC CATHETERIZATION  10/24/2012   CORONARY ARTERY BYPASS GRAFT N/A 10/26/2012   Procedure: CORONARY ARTERY BYPASS GRAFTING (CABG);  Surgeon: Melrose Nakayama, MD;  Location: [facility];  Service: Open Heart Surgery;  Laterality: N/A;  Times 3 using left internal mammary artery and endoscopically harvested right saphenous vein   ERCP N/A 04/25/2020   Procedure: ENDOSCOPIC RETROGRADE CHOLANGIOPANCREATOGRAPHY (ERCP);  Surgeon: Lucilla Lame, MD;  Location: [facility] ENDOSCOPY;  Service: Endoscopy;  Laterality: N/A;    Social History   Socioeconomic History   Marital status: Married    Spouse name: Not on file   Number of children: Not on file   Years of education: Not on file   Highest education level: Not on file  Occupational History   Not on file  Tobacco Use   Smoking status: Former    Packs/day: 0.50    Years: 30.00    Pack years: 15.00    Types: Cigarettes    Quit date: 08/28/2001    Years since quitting: 18.8   Smokeless tobacco: Never  Vaping Use   Vaping Use: Never used  Substance and Sexual Activity   Alcohol use: No    Alcohol/week: 0.0 standard drinks    Comment: 10/24/2012 "stopped drinking alcohol in 2003; used to drink ~ 9 bottles beer/wk"   Drug use: No   Sexual activity: Yes  Other Topics Concern   Not on file  Social History Narrative   Not on file   Social Determinants of Health   Financial Resource Strain: Not on file  Food Insecurity: Not on file  Transportation Needs: Not on file  Physical Activity: Not on file  Stress: Not on file  Social Connections: Not on file  Intimate Partner Violence: Not on file    Family  History  Problem Relation Age of Onset   Cancer Sister      Current Outpatient Medications:    acyclovir (ZOVIRAX) 400 MG tablet, Take 1 tablet (400 mg total) by mouth 2 (two) times daily., Disp: 60 tablet, Rfl: 1   atorvastatin (LIPITOR) 40 MG tablet, Take 1 tablet by mouth daily., Disp: , Rfl:    glimepiride (AMARYL) 1 MG tablet, Take 1 mg by mouth daily., Disp: , Rfl:    levofloxacin (LEVAQUIN) 500 MG tablet, Take 1 tablet (500 mg total) by mouth daily., Disp: 30 tablet, Rfl: 0   loratadine (CLARITIN) 10 MG tablet, Take 5 mg by mouth daily as needed. Take half a tablet (4m) daily, Disp: , Rfl:  losartan (COZAAR) 25 MG tablet, Take 25 mg by mouth daily., Disp: , Rfl:    metoprolol tartrate (LOPRESSOR) 25 MG tablet, Take 25 mg by mouth daily., Disp: , Rfl:    omeprazole (PRILOSEC) 20 MG capsule, Take 40 mg by mouth every morning., Disp: , Rfl:    oxyCODONE (OXY IR/ROXICODONE) 5 MG immediate release tablet, Take 1 tablet (5 mg total) by mouth every 8 (eight) hours as needed for severe pain., Disp: 90 tablet, Rfl: 0   traZODone (DESYREL) 50 MG tablet, Take 1-2 tablets (50-100 mg total) by mouth at bedtime., Disp: 90 tablet, Rfl: 0   LORazepam (ATIVAN) 0.5 MG tablet, Take 1 tablet (0.5 mg total) by mouth every 6 (six) hours as needed (Nausea or vomiting). (Patient not taking: No sig reported), Disp: 30 tablet, Rfl: 0   nitroGLYCERIN (NITROSTAT) 0.3 MG SL tablet, nitroglycerin 0.3 mg sublingual tablet  PLACE ONE TABLET (0.3 MG DOSE) UNDER THE TONGUE EVERY 5 (FIVE) MINUTES AS NEEDED FOR CHEST PAIN. (Patient not taking: Reported on 06/23/2020), Disp: , Rfl:    ondansetron (ZOFRAN) 8 MG tablet, Take 1 tablet (8 mg total) by mouth 2 (two) times daily as needed (Nausea or vomiting). (Patient not taking: No sig reported), Disp: 30 tablet, Rfl: 1   prochlorperazine (COMPAZINE) 10 MG tablet, Take 1 tablet (10 mg total) by mouth every 6 (six) hours as needed (Nausea or vomiting). (Patient not taking: No  sig reported), Disp: 30 tablet, Rfl: 1  Physical exam:  Vitals:   06/23/20 1414  BP: (!) 101/50  Pulse: 77  Resp: 16  Temp: 98.2 F (36.8 C)  TempSrc: Oral  Weight: 198 lb 4.8 oz (89.9 kg)  Height: 6' (1.829 m)   Physical Exam Constitutional:      General: He is not in acute distress. Cardiovascular:     Rate and Rhythm: Normal rate and regular rhythm.     Heart sounds: Normal heart sounds.  Pulmonary:     Effort: Pulmonary effort is normal.     Breath sounds: Normal breath sounds.  Abdominal:     General: Bowel sounds are normal.     Palpations: Abdomen is soft.  Musculoskeletal:     Right lower leg: Edema present.     Left lower leg: Edema present.  Skin:    General: Skin is warm and dry.  Neurological:     Mental Status: He is alert and oriented to person, place, and time.     CMP Latest Ref Rng & Units 06/23/2020  Glucose 70 - 99 mg/dL 151(H)  BUN 8 - 23 mg/dL 19  Creatinine 0.61 - 1.24 mg/dL 1.12  Sodium 135 - 145 mmol/L 132(L)  Potassium 3.5 - 5.1 mmol/L 4.0  Chloride 98 - 111 mmol/L 100  CO2 22 - 32 mmol/L 25  Calcium 8.9 - 10.3 mg/dL 8.3(L)  Total Protein 6.5 - 8.1 g/dL 6.3(L)  Total Bilirubin 0.3 - 1.2 mg/dL 2.1(H)  Alkaline Phos 38 - 126 U/L 81  AST 15 - 41 U/L 20  ALT 0 - 44 U/L 20   CBC Latest Ref Rng & Units 06/23/2020  WBC 4.0 - 10.5 K/uL 0.5(LL)  Hemoglobin 13.0 - 17.0 g/dL 6.8(L)  Hematocrit 39.0 - 52.0 % 19.7(L)  Platelets 150 - 400 K/uL 23(LL)     Assessment and plan- Patient is a 75 y.o. male with high risk MDS and T p53 mutation here for on treatment assessment prior to cycle 2-day 1 of Vidaza  Patient continues to  have significant pancytopenia likely secondary to MDS.  He will proceed with cycle 2-day 1 of Vidaza today.  He will remain on acyclovir and Levaquin prophylaxis.  Hemoglobin 6.8 today and will receive 1 unit of PRBC transfusion tomorrow.  Transfuse if hemoglobin less than 7 or platelets less than 15.  He has chronic  nonmalignant low back pain for which she is on as needed oxycodone.  Counts to be checked weekly for possible transfusion.  Hoping his counts will get better after 2-3 cycles of Vidaza.  I will also refer him to [facility] for a second opinion given that he has high risk MDS with T p53 mutation.  Patient is agreeable  History of HCC: We will plan to repeat MRI in August 2022.  He is s/p thermal ablation in the past.   Visit Diagnosis 1. MDS (myelodysplastic syndrome) ([place])   2. Encounter for antineoplastic chemotherapy   3. Other pancytopenia ([place])   4. Severe neutropenia ([facility])      Dr. Randa Evens, MD, MPH [facility] [phone] 06/24/2020 8:40 AM

## 2020-06-25 ENCOUNTER — Inpatient Hospital Stay: Payer: Medicare HMO

## 2020-06-25 ENCOUNTER — Other Ambulatory Visit: Payer: Self-pay

## 2020-06-25 ENCOUNTER — Ambulatory Visit: Payer: Medicare HMO

## 2020-06-25 VITALS — BP 103/41 | HR 71 | Temp 96.7°F | Resp 16

## 2020-06-25 DIAGNOSIS — E119 Type 2 diabetes mellitus without complications: Secondary | ICD-10-CM | POA: Diagnosis not present

## 2020-06-25 DIAGNOSIS — I2510 Atherosclerotic heart disease of native coronary artery without angina pectoris: Secondary | ICD-10-CM | POA: Diagnosis not present

## 2020-06-25 DIAGNOSIS — Z5111 Encounter for antineoplastic chemotherapy: Secondary | ICD-10-CM | POA: Diagnosis not present

## 2020-06-25 DIAGNOSIS — K7460 Unspecified cirrhosis of liver: Secondary | ICD-10-CM | POA: Diagnosis not present

## 2020-06-25 DIAGNOSIS — Z8505 Personal history of malignant neoplasm of liver: Secondary | ICD-10-CM | POA: Diagnosis not present

## 2020-06-25 DIAGNOSIS — D469 Myelodysplastic syndrome, unspecified: Secondary | ICD-10-CM | POA: Diagnosis not present

## 2020-06-25 DIAGNOSIS — Z298 Encounter for other specified prophylactic measures: Secondary | ICD-10-CM | POA: Diagnosis not present

## 2020-06-25 DIAGNOSIS — G4700 Insomnia, unspecified: Secondary | ICD-10-CM | POA: Diagnosis not present

## 2020-06-25 DIAGNOSIS — D649 Anemia, unspecified: Secondary | ICD-10-CM

## 2020-06-25 DIAGNOSIS — Z9221 Personal history of antineoplastic chemotherapy: Secondary | ICD-10-CM | POA: Diagnosis not present

## 2020-06-25 LAB — TYPE AND SCREEN
ABO/RH(D): A POS
Antibody Screen: NEGATIVE
Unit division: 0

## 2020-06-25 LAB — BPAM RBC
Blood Product Expiration Date: 202207152359
Unit Type and Rh: 6200

## 2020-06-25 MED ORDER — ACETAMINOPHEN 325 MG PO TABS
650.0000 mg | ORAL_TABLET | Freq: Once | ORAL | Status: AC
  Administered 2020-06-25: 650 mg via ORAL
  Filled 2020-06-25: qty 2

## 2020-06-25 MED ORDER — ONDANSETRON HCL 4 MG PO TABS
8.0000 mg | ORAL_TABLET | Freq: Once | ORAL | Status: AC
Start: 2020-06-25 — End: 2020-06-25
  Administered 2020-06-25: 8 mg via ORAL
  Filled 2020-06-25: qty 2

## 2020-06-25 MED ORDER — AZACITIDINE CHEMO SQ INJECTION
75.0000 mg/m2 | Freq: Once | INTRAMUSCULAR | Status: AC
  Administered 2020-06-25: 162.5 mg via SUBCUTANEOUS
  Filled 2020-06-25: qty 6.5

## 2020-06-25 MED ORDER — SODIUM CHLORIDE 0.9% IV SOLUTION
250.0000 mL | Freq: Once | INTRAVENOUS | Status: AC
Start: 2020-06-25 — End: 2020-06-25
  Administered 2020-06-25: 250 mL via INTRAVENOUS
  Filled 2020-06-25: qty 250

## 2020-06-25 NOTE — Progress Notes (Signed)
BP (!) 103/41   Pulse 71   Temp (!) 96.7 F (35.9 C) (Tympanic)   Resp 16  Dr. Janese Banks notified of patient's BP following transfusion. Patient is taking losartan and metoprolol. Instructed patient to hold losartan per Dr. Janese Banks and recheck BP at home, if >827 systolic can restart losartan.

## 2020-06-25 NOTE — Patient Instructions (Signed)
[facility]  Discharge Instructions: Thank you for choosing [facility] to provide your oncology and hematology care.  If you have a lab appointment with the [facility], please go directly to the [facility] and check in at the registration area.  Wear comfortable clothing and clothing appropriate for easy access to any Portacath or PICC line.   We strive to give you quality time with your provider. You may need to reschedule your appointment if you arrive late (15 or more minutes).  Arriving late affects you and other patients whose appointments are after yours.  Also, if you miss three or more appointments without notifying the office, you may be dismissed from the clinic at the provider's discretion.      For prescription refill requests, have your pharmacy contact our office and allow 72 hours for refills to be completed.    Today you received the following chemotherapy and/or immunotherapy agents - azacitadine      To help prevent nausea and vomiting after your treatment, we encourage you to take your nausea medication as directed.  BELOW ARE SYMPTOMS THAT SHOULD BE REPORTED IMMEDIATELY: *FEVER GREATER THAN 100.4 F (38 C) OR HIGHER *CHILLS OR SWEATING *NAUSEA AND VOMITING THAT IS NOT CONTROLLED WITH YOUR NAUSEA MEDICATION *UNUSUAL SHORTNESS OF BREATH *UNUSUAL BRUISING OR BLEEDING *URINARY PROBLEMS (pain or burning when urinating, or frequent urination) *BOWEL PROBLEMS (unusual diarrhea, constipation, pain near the anus) TENDERNESS IN MOUTH AND THROAT WITH OR WITHOUT PRESENCE OF ULCERS (sore throat, sores in mouth, or a toothache) UNUSUAL RASH, SWELLING OR PAIN  UNUSUAL VAGINAL DISCHARGE OR ITCHING   Items with * indicate a potential emergency and should be followed up as soon as possible or go to the Emergency Department if any problems should occur.  Please show the CHEMOTHERAPY ALERT CARD or IMMUNOTHERAPY ALERT CARD at  check-in to the Emergency Department and triage nurse.  Should you have questions after your visit or need to cancel or reschedule your appointment, please contact [facility]  [phone] and follow the prompts.  Office hours are 8:00 a.m. to 4:30 p.m. Monday - Friday. Please note that voicemails left after 4:00 p.m. may not be returned until the following business day.  We are closed weekends and major holidays. You have access to a nurse at all times for urgent questions. Please call the main number to the clinic [phone] and follow the prompts.  For any non-urgent questions, you may also contact your provider using MyChart. We now offer e-Visits for anyone 8 and older to request care online for non-urgent symptoms. For details visit [email or web address].   Also download the MyChart app! Go to the app store, search "MyChart", open the app, select [facility], and log in with your MyChart username and password.  Due to Covid, a mask is required upon entering the hospital/clinic. If you do not have a mask, one will be given to you upon arrival. For doctor visits, patients may have 1 support person aged 40 or older with them. For treatment visits, patients cannot have anyone with them due to current Covid guidelines and our immunocompromised population.   Azacitidine suspension for injection (subcutaneous use) What is this medication? AZACITIDINE (ay Bodega) is a chemotherapy drug. This medicine reduces the growth of cancer cells and can suppress the immune system. It is used fortreating myelodysplastic syndrome or some types of leukemia. This medicine may be used for other purposes; ask  your health care provider orpharmacist if you have questions. COMMON BRAND NAME(S): Vidaza What should I tell my care team before I take this medication? They need to know if you have any of these conditions: kidney disease liver disease liver tumors an unusual  or allergic reaction to azacitidine, mannitol, other medicines, foods, dyes, or preservatives pregnant or trying to get pregnant breast-feeding How should I use this medication? This medicine is for injection under the skin. It is administered in a hospitalor clinic by a specially trained health care professional. Talk to your pediatrician regarding the use of this medicine in children. Whilethis drug may be prescribed for selected conditions, precautions do apply. Overdosage: If you think you have taken too much of this medicine contact apoison control center or emergency room at once. NOTE: This medicine is only for you. Do not share this medicine with others. What if I miss a dose? It is important not to miss your dose. Call your doctor or health careprofessional if you are unable to keep an appointment. What may interact with this medication? Interactions have not been studied. Give your health care provider a list of all the medicines, herbs, non-prescription drugs, or dietary supplements you use. Also tell them if you smoke, drink alcohol, or use illegal drugs. Some items may interact with yourmedicine. This list may not describe all possible interactions. Give your health care provider a list of all the medicines, herbs, non-prescription drugs, or dietary supplements you use. Also tell them if you smoke, drink alcohol, or use illegaldrugs. Some items may interact with your medicine. What should I watch for while using this medication? Visit your doctor for checks on your progress. This drug may make you feel generally unwell. This is not uncommon, as chemotherapy can affect healthy cells as well as cancer cells. Report any side effects. Continue your course oftreatment even though you feel ill unless your doctor tells you to stop. In some cases, you may be given additional medicines to help with side effects.Follow all directions for their use. Call your doctor or health care professional for  advice if you get a fever, chills or sore throat, or other symptoms of a cold or flu. Do not treat yourself. This drug decreases your body's ability to fight infections. Try toavoid being around people who are sick. This medicine may increase your risk to bruise or bleed. Call your doctor orhealth care professional if you notice any unusual bleeding. You may need blood work done while you are taking this medicine. Do not become pregnant while taking this medicine and for 6 months after the last dose. Women should inform their doctor if they wish to become pregnant or think they might be pregnant. Men should not father a child while taking this medicine and for 3 months after the last dose. There is a potential for serious side effects to an unborn child. Talk to your health care professional or pharmacist for more information. Do not breast-feed an infant while taking thismedicine and for 1 week after the last dose. This medicine may interfere with the ability to have a child. Talk with yourdoctor or health care professional if you are concerned about your fertility. What side effects may I notice from receiving this medication? Side effects that you should report to your doctor or health care professionalas soon as possible: allergic reactions like skin rash, itching or hives, swelling of the face, lips, or tongue low blood counts - this medicine may decrease the number of white  blood cells, red blood cells and platelets. You may be at increased risk for infections and bleeding. signs of infection - fever or chills, cough, sore throat, pain passing urine signs of decreased platelets or bleeding - bruising, pinpoint red spots on the skin, black, tarry stools, blood in the urine signs of decreased red blood cells - unusually weak or tired, fainting spells, lightheadedness signs and symptoms of kidney injury like trouble passing urine or change in the amount of urine signs and symptoms of liver injury  like dark yellow or brown urine; general ill feeling or flu-like symptoms; light-colored stools; loss of appetite; nausea; right upper belly pain; unusually weak or tired; yellowing of the eyes or skin Side effects that usually do not require medical attention (report to yourdoctor or health care professional if they continue or are bothersome): constipation diarrhea nausea, vomiting pain or redness at the injection site unusually weak or tired This list may not describe all possible side effects. Call your doctor for medical advice about side effects. You may report side effects to FDA at1-800-FDA-1088. Where should I keep my medication? This drug is given in a hospital or clinic and will not be stored at home. NOTE: This sheet is a summary. It may not cover all possible information. If you have questions about this medicine, talk to your doctor, pharmacist, orhealth care provider.  2022 Elsevier/Gold Standard (2016-01-20 14:37:51)  Blood Transfusion, Adult, Care After This sheet gives you information about how to care for yourself after your procedure. Your doctor may also give you more specific instructions. If youhave problems or questions, contact your doctor. What can I expect after the procedure? After the procedure, it is common to have: Bruising and soreness at the IV site. A fever or chills on the day of the procedure. This may be your body's response to the new blood cells received. A headache. Follow these instructions at home: Insertion site care     Follow instructions from your doctor about how to take care of your insertion site. This is where an IV tube was put into your vein. Make sure you: Wash your hands with soap and water before and after you change your bandage (dressing). If you cannot use soap and water, use hand sanitizer. Change your bandage as told by your doctor. Check your insertion site every day for signs of infection. Check for: Redness, swelling, or  pain. Bleeding from the site. Warmth. Pus or a bad smell. General instructions Take over-the-counter and prescription medicines only as told by your doctor. Rest as told by your doctor. Go back to your normal activities as told by your doctor. Keep all follow-up visits as told by your doctor. This is important. Contact a doctor if: You have itching or red, swollen areas of skin (hives). You feel worried or nervous (anxious). You feel weak after doing your normal activities. You have redness, swelling, warmth, or pain around the insertion site. You have blood coming from the insertion site, and the blood does not stop with pressure. You have pus or a bad smell coming from the insertion site. Get help right away if: You have signs of a serious reaction. This may be coming from an allergy or the body's defense system (immune system). Signs include: Trouble breathing or shortness of breath. Swelling of the face or feeling warm (flushed). Fever or chills. Head, chest, or back pain. Dark pee (urine) or blood in the pee. Widespread rash. Fast heartbeat. Feeling dizzy or light-headed. You may  receive your blood transfusion in an outpatient setting. If so, youwill be told whom to contact to report any reactions. These symptoms may be an emergency. Do not wait to see if the symptoms will go away. Get medical help right away. Call your local emergency services (911 in the [place] Do not drive yourself to the hospital. Summary Bruising and soreness at the IV site are common. Check your insertion site every day for signs of infection. Rest as told by your doctor. Go back to your normal activities as told by your doctor. Get help right away if you have signs of a serious reaction. This information is not intended to replace advice given to you by your health care provider. Make sure you discuss any questions you have with your healthcare provider. Document Revised: 06/15/2018 Document Reviewed:  06/15/2018 Elsevier Patient Education  [place].

## 2020-06-26 ENCOUNTER — Telehealth: Payer: Self-pay | Admitting: *Deleted

## 2020-06-26 ENCOUNTER — Inpatient Hospital Stay: Payer: Medicare HMO

## 2020-06-26 ENCOUNTER — Ambulatory Visit: Payer: Medicare HMO

## 2020-06-26 VITALS — BP 123/49 | HR 87 | Temp 99.7°F | Resp 16

## 2020-06-26 DIAGNOSIS — K7460 Unspecified cirrhosis of liver: Secondary | ICD-10-CM | POA: Diagnosis not present

## 2020-06-26 DIAGNOSIS — E119 Type 2 diabetes mellitus without complications: Secondary | ICD-10-CM | POA: Diagnosis not present

## 2020-06-26 DIAGNOSIS — G4700 Insomnia, unspecified: Secondary | ICD-10-CM | POA: Diagnosis not present

## 2020-06-26 DIAGNOSIS — Z8505 Personal history of malignant neoplasm of liver: Secondary | ICD-10-CM | POA: Diagnosis not present

## 2020-06-26 DIAGNOSIS — D469 Myelodysplastic syndrome, unspecified: Secondary | ICD-10-CM | POA: Diagnosis not present

## 2020-06-26 DIAGNOSIS — Z298 Encounter for other specified prophylactic measures: Secondary | ICD-10-CM | POA: Diagnosis not present

## 2020-06-26 DIAGNOSIS — I2510 Atherosclerotic heart disease of native coronary artery without angina pectoris: Secondary | ICD-10-CM | POA: Diagnosis not present

## 2020-06-26 DIAGNOSIS — Z9221 Personal history of antineoplastic chemotherapy: Secondary | ICD-10-CM | POA: Diagnosis not present

## 2020-06-26 DIAGNOSIS — Z5111 Encounter for antineoplastic chemotherapy: Secondary | ICD-10-CM | POA: Diagnosis not present

## 2020-06-26 LAB — BPAM RBC
Blood Product Expiration Date: 202207072359
ISSUE DATE / TIME: 202206221133
Unit Type and Rh: 600

## 2020-06-26 LAB — TYPE AND SCREEN
ABO/RH(D): A POS
Antibody Screen: NEGATIVE
Unit division: 0

## 2020-06-26 MED ORDER — AZACITIDINE CHEMO SQ INJECTION
75.0000 mg/m2 | Freq: Once | INTRAMUSCULAR | Status: AC
  Administered 2020-06-26: 162.5 mg via SUBCUTANEOUS
  Filled 2020-06-26: qty 6.5

## 2020-06-26 MED ORDER — ONDANSETRON HCL 4 MG PO TABS
8.0000 mg | ORAL_TABLET | Freq: Once | ORAL | Status: AC
  Administered 2020-06-26: 8 mg via ORAL
  Filled 2020-06-26: qty 2

## 2020-06-26 NOTE — Telephone Encounter (Signed)
Please ask him to take otc senna and miralax. If he is already doing that we can prescribe lactulose BID prn. No suppositories.

## 2020-06-26 NOTE — Telephone Encounter (Signed)
Patient called answering service and left message that he wants a prescription for constipation. Please advise

## 2020-06-26 NOTE — Progress Notes (Signed)
Patient states since starting Levofloxacin and Acyclovir ("about a month ago") he has had abdominal discomfort and decreased appetite. Patient looking for suggestions. Dr. Janese Banks made aware and per Dr. Janese Banks, patient is to wait until [facility] appt. in one week due to increased risk for infection. Patient verbalizes understanding and denies any further questions or concerns.

## 2020-06-26 NOTE — Telephone Encounter (Signed)
Attempted to call patient. Got voice mail. Left message to call me back

## 2020-06-27 ENCOUNTER — Ambulatory Visit: Payer: Medicare HMO

## 2020-06-27 ENCOUNTER — Inpatient Hospital Stay: Payer: Medicare HMO

## 2020-06-27 VITALS — BP 113/57 | HR 89 | Temp 97.8°F | Resp 20

## 2020-06-27 DIAGNOSIS — D469 Myelodysplastic syndrome, unspecified: Secondary | ICD-10-CM | POA: Diagnosis not present

## 2020-06-27 DIAGNOSIS — Z5111 Encounter for antineoplastic chemotherapy: Secondary | ICD-10-CM | POA: Diagnosis not present

## 2020-06-27 DIAGNOSIS — I2510 Atherosclerotic heart disease of native coronary artery without angina pectoris: Secondary | ICD-10-CM | POA: Diagnosis not present

## 2020-06-27 DIAGNOSIS — Z9221 Personal history of antineoplastic chemotherapy: Secondary | ICD-10-CM | POA: Diagnosis not present

## 2020-06-27 DIAGNOSIS — K7460 Unspecified cirrhosis of liver: Secondary | ICD-10-CM | POA: Diagnosis not present

## 2020-06-27 DIAGNOSIS — Z8505 Personal history of malignant neoplasm of liver: Secondary | ICD-10-CM | POA: Diagnosis not present

## 2020-06-27 DIAGNOSIS — E119 Type 2 diabetes mellitus without complications: Secondary | ICD-10-CM | POA: Diagnosis not present

## 2020-06-27 DIAGNOSIS — G4700 Insomnia, unspecified: Secondary | ICD-10-CM | POA: Diagnosis not present

## 2020-06-27 DIAGNOSIS — Z298 Encounter for other specified prophylactic measures: Secondary | ICD-10-CM | POA: Diagnosis not present

## 2020-06-27 MED ORDER — ONDANSETRON HCL 4 MG PO TABS
8.0000 mg | ORAL_TABLET | Freq: Once | ORAL | Status: AC
  Administered 2020-06-27: 8 mg via ORAL
  Filled 2020-06-27: qty 2

## 2020-06-27 MED ORDER — AZACITIDINE CHEMO SQ INJECTION
75.0000 mg/m2 | Freq: Once | INTRAMUSCULAR | Status: AC
  Administered 2020-06-27: 162.5 mg via SUBCUTANEOUS
  Filled 2020-06-27: qty 6.5

## 2020-06-27 NOTE — Patient Instructions (Signed)
ONCOLOGY  Discharge Instructions: Thank you for choosing [facility] to provide your oncology and hematology care.  If you have a lab appointment with the [facility], please go directly to the [facility] and check in at the registration area.  Wear comfortable clothing and clothing appropriate for easy access to any Portacath or PICC line.   We strive to give you quality time with your provider. You may need to reschedule your appointment if you arrive late (15 or more minutes).  Arriving late affects you and other patients whose appointments are after yours.  Also, if you miss three or more appointments without notifying the office, you may be dismissed from the clinic at the provider's discretion.      For prescription refill requests, have your pharmacy contact our office and allow 72 hours for refills to be completed.    Today you received the following chemotherapy and/or immunotherapy agents Vidaza      To help prevent nausea and vomiting after your treatment, we encourage you to take your nausea medication as directed.  BELOW ARE SYMPTOMS THAT SHOULD BE REPORTED IMMEDIATELY: *FEVER GREATER THAN 100.4 F (38 C) OR HIGHER *CHILLS OR SWEATING *NAUSEA AND VOMITING THAT IS NOT CONTROLLED WITH YOUR NAUSEA MEDICATION *UNUSUAL SHORTNESS OF BREATH *UNUSUAL BRUISING OR BLEEDING *URINARY PROBLEMS (pain or burning when urinating, or frequent urination) *BOWEL PROBLEMS (unusual diarrhea, constipation, pain near the anus) TENDERNESS IN MOUTH AND THROAT WITH OR WITHOUT PRESENCE OF ULCERS (sore throat, sores in mouth, or a toothache) UNUSUAL RASH, SWELLING OR PAIN  UNUSUAL VAGINAL DISCHARGE OR ITCHING   Items with * indicate a potential emergency and should be followed up as soon as possible or go to the Emergency Department if any problems should occur.  Please show the CHEMOTHERAPY ALERT CARD or IMMUNOTHERAPY ALERT CARD at check-in to  the Emergency Department and triage nurse.  Should you have questions after your visit or need to cancel or reschedule your appointment, please contact [facility]  [phone] and follow the prompts.  Office hours are 8:00 a.m. to 4:30 p.m. Monday - Friday. Please note that voicemails left after 4:00 p.m. may not be returned until the following business day.  We are closed weekends and major holidays. You have access to a nurse at all times for urgent questions. Please call the main number to the clinic [phone] and follow the prompts.  For any non-urgent questions, you may also contact your provider using MyChart. We now offer e-Visits for anyone 29 and older to request care online for non-urgent symptoms. For details visit [email or web address].   Also download the MyChart app! Go to the app store, search "MyChart", open the app, select [facility], and log in with your MyChart username and password.  Due to Covid, a mask is required upon entering the hospital/clinic. If you do not have a mask, one will be given to you upon arrival. For doctor visits, patients may have 1 support person aged 63 or older with them. For treatment visits, patients cannot have anyone with them due to current Covid guidelines and our immunocompromised population. Azacitidine suspension for injection (subcutaneous use) What is this medication? AZACITIDINE (ay [place]) is a chemotherapy drug. This medicine reduces the growth of cancer cells and can suppress the immune system. It is used fortreating myelodysplastic syndrome or some types of leukemia. This medicine may be used for other purposes; ask your health care  provider orpharmacist if you have questions. COMMON BRAND NAME(S): Vidaza What should I tell my care team before I take this medication? They need to know if you have any of these conditions: kidney disease liver disease liver tumors an unusual or allergic  reaction to azacitidine, mannitol, other medicines, foods, dyes, or preservatives pregnant or trying to get pregnant breast-feeding How should I use this medication? This medicine is for injection under the skin. It is administered in a hospitalor clinic by a specially trained health care professional. Talk to your pediatrician regarding the use of this medicine in children. Whilethis drug may be prescribed for selected conditions, precautions do apply. Overdosage: If you think you have taken too much of this medicine contact apoison control center or emergency room at once. NOTE: This medicine is only for you. Do not share this medicine with others. What if I miss a dose? It is important not to miss your dose. Call your doctor or health careprofessional if you are unable to keep an appointment. What may interact with this medication? Interactions have not been studied. Give your health care provider a list of all the medicines, herbs, non-prescription drugs, or dietary supplements you use. Also tell them if you smoke, drink alcohol, or use illegal drugs. Some items may interact with yourmedicine. This list may not describe all possible interactions. Give your health care provider a list of all the medicines, herbs, non-prescription drugs, or dietary supplements you use. Also tell them if you smoke, drink alcohol, or use illegaldrugs. Some items may interact with your medicine. What should I watch for while using this medication? Visit your doctor for checks on your progress. This drug may make you feel generally unwell. This is not uncommon, as chemotherapy can affect healthy cells as well as cancer cells. Report any side effects. Continue your course oftreatment even though you feel ill unless your doctor tells you to stop. In some cases, you may be given additional medicines to help with side effects.Follow all directions for their use. Call your doctor or health care professional for advice if  you get a fever, chills or sore throat, or other symptoms of a cold or flu. Do not treat yourself. This drug decreases your body's ability to fight infections. Try toavoid being around people who are sick. This medicine may increase your risk to bruise or bleed. Call your doctor orhealth care professional if you notice any unusual bleeding. You may need blood work done while you are taking this medicine. Do not become pregnant while taking this medicine and for 6 months after the last dose. Women should inform their doctor if they wish to become pregnant or think they might be pregnant. Men should not father a child while taking this medicine and for 3 months after the last dose. There is a potential for serious side effects to an unborn child. Talk to your health care professional or pharmacist for more information. Do not breast-feed an infant while taking thismedicine and for 1 week after the last dose. This medicine may interfere with the ability to have a child. Talk with yourdoctor or health care professional if you are concerned about your fertility. What side effects may I notice from receiving this medication? Side effects that you should report to your doctor or health care professionalas soon as possible: allergic reactions like skin rash, itching or hives, swelling of the face, lips, or tongue low blood counts - this medicine may decrease the number of white blood cells, red  blood cells and platelets. You may be at increased risk for infections and bleeding. signs of infection - fever or chills, cough, sore throat, pain passing urine signs of decreased platelets or bleeding - bruising, pinpoint red spots on the skin, black, tarry stools, blood in the urine signs of decreased red blood cells - unusually weak or tired, fainting spells, lightheadedness signs and symptoms of kidney injury like trouble passing urine or change in the amount of urine signs and symptoms of liver injury like dark  yellow or brown urine; general ill feeling or flu-like symptoms; light-colored stools; loss of appetite; nausea; right upper belly pain; unusually weak or tired; yellowing of the eyes or skin Side effects that usually do not require medical attention (report to yourdoctor or health care professional if they continue or are bothersome): constipation diarrhea nausea, vomiting pain or redness at the injection site unusually weak or tired This list may not describe all possible side effects. Call your doctor for medical advice about side effects. You may report side effects to FDA at1-800-FDA-1088. Where should I keep my medication? This drug is given in a hospital or clinic and will not be stored at home. NOTE: This sheet is a summary. It may not cover all possible information. If you have questions about this medicine, talk to your doctor, pharmacist, orhealth care provider.  2022 Elsevier/Gold Standard (2016-01-20 14:37:51)

## 2020-07-02 ENCOUNTER — Inpatient Hospital Stay: Payer: Medicare HMO

## 2020-07-02 ENCOUNTER — Inpatient Hospital Stay (HOSPITAL_BASED_OUTPATIENT_CLINIC_OR_DEPARTMENT_OTHER): Payer: Medicare HMO | Admitting: Hospice and Palliative Medicine

## 2020-07-02 ENCOUNTER — Other Ambulatory Visit: Payer: Self-pay | Admitting: Oncology

## 2020-07-02 ENCOUNTER — Other Ambulatory Visit: Payer: Self-pay

## 2020-07-02 DIAGNOSIS — Z8505 Personal history of malignant neoplasm of liver: Secondary | ICD-10-CM | POA: Diagnosis not present

## 2020-07-02 DIAGNOSIS — G4700 Insomnia, unspecified: Secondary | ICD-10-CM | POA: Diagnosis not present

## 2020-07-02 DIAGNOSIS — D649 Anemia, unspecified: Secondary | ICD-10-CM

## 2020-07-02 DIAGNOSIS — I2510 Atherosclerotic heart disease of native coronary artery without angina pectoris: Secondary | ICD-10-CM | POA: Diagnosis not present

## 2020-07-02 DIAGNOSIS — Z9221 Personal history of antineoplastic chemotherapy: Secondary | ICD-10-CM | POA: Diagnosis not present

## 2020-07-02 DIAGNOSIS — Z298 Encounter for other specified prophylactic measures: Secondary | ICD-10-CM | POA: Diagnosis not present

## 2020-07-02 DIAGNOSIS — D469 Myelodysplastic syndrome, unspecified: Secondary | ICD-10-CM | POA: Diagnosis not present

## 2020-07-02 DIAGNOSIS — Z5111 Encounter for antineoplastic chemotherapy: Secondary | ICD-10-CM | POA: Diagnosis not present

## 2020-07-02 DIAGNOSIS — E119 Type 2 diabetes mellitus without complications: Secondary | ICD-10-CM | POA: Diagnosis not present

## 2020-07-02 DIAGNOSIS — K7460 Unspecified cirrhosis of liver: Secondary | ICD-10-CM | POA: Diagnosis not present

## 2020-07-02 LAB — CBC WITH DIFFERENTIAL/PLATELET
Abs Immature Granulocytes: 0.01 10*3/uL (ref 0.00–0.07)
Basophils Absolute: 0 10*3/uL (ref 0.0–0.1)
Basophils Relative: 2 %
Eosinophils Absolute: 0 10*3/uL (ref 0.0–0.5)
Eosinophils Relative: 0 %
HCT: 17.2 % — ABNORMAL LOW (ref 39.0–52.0)
Hemoglobin: 6 g/dL — ABNORMAL LOW (ref 13.0–17.0)
Immature Granulocytes: 2 %
Lymphocytes Relative: 56 %
Lymphs Abs: 0.3 10*3/uL — ABNORMAL LOW (ref 0.7–4.0)
MCH: 33.9 pg (ref 26.0–34.0)
MCHC: 34.9 g/dL (ref 30.0–36.0)
MCV: 97.2 fL (ref 80.0–100.0)
Monocytes Absolute: 0.1 10*3/uL (ref 0.1–1.0)
Monocytes Relative: 8 %
Neutro Abs: 0.2 10*3/uL — CL (ref 1.7–7.7)
Neutrophils Relative %: 32 %
Platelets: 19 10*3/uL — CL (ref 150–400)
RBC: 1.77 MIL/uL — ABNORMAL LOW (ref 4.22–5.81)
RDW: 17.8 % — ABNORMAL HIGH (ref 11.5–15.5)
Smear Review: NORMAL
WBC: 0.6 10*3/uL — CL (ref 4.0–10.5)
nRBC: 0 % (ref 0.0–0.2)

## 2020-07-02 LAB — COMPREHENSIVE METABOLIC PANEL
ALT: 27 U/L (ref 0–44)
AST: 26 U/L (ref 15–41)
Albumin: 3.6 g/dL (ref 3.5–5.0)
Alkaline Phosphatase: 92 U/L (ref 38–126)
Anion gap: 7 (ref 5–15)
BUN: 16 mg/dL (ref 8–23)
CO2: 25 mmol/L (ref 22–32)
Calcium: 8.6 mg/dL — ABNORMAL LOW (ref 8.9–10.3)
Chloride: 103 mmol/L (ref 98–111)
Creatinine, Ser: 1.03 mg/dL (ref 0.61–1.24)
GFR, Estimated: >60 mL/min (ref >60)
Glucose, Bld: 177 mg/dL — ABNORMAL HIGH (ref 70–99)
Potassium: 3.8 mmol/L (ref 3.5–5.1)
Sodium: 135 mmol/L (ref 135–145)
Total Bilirubin: 2.2 mg/dL — ABNORMAL HIGH (ref 0.3–1.2)
Total Protein: 6.2 g/dL — ABNORMAL LOW (ref 6.5–8.1)

## 2020-07-02 LAB — SAMPLE TO BLOOD BANK

## 2020-07-02 LAB — PREPARE RBC (CROSSMATCH)

## 2020-07-02 MED ORDER — ACETAMINOPHEN 325 MG PO TABS
650.0000 mg | ORAL_TABLET | Freq: Once | ORAL | Status: AC
  Administered 2020-07-02: 650 mg via ORAL
  Filled 2020-07-02: qty 2

## 2020-07-02 MED ORDER — GABAPENTIN 300 MG PO CAPS: 300.0000 mg | ORAL_CAPSULE | Freq: Every day | ORAL | 0 refills | Status: DC

## 2020-07-02 MED ORDER — SODIUM CHLORIDE 0.9 % IV SOLN
Freq: Once | INTRAVENOUS | Status: AC
  Filled 2020-07-02: qty 250

## 2020-07-02 MED ORDER — SODIUM CHLORIDE 0.9% IV SOLUTION
250.0000 mL | Freq: Once | INTRAVENOUS | Status: AC
Start: 2020-07-02 — End: 2020-07-02
  Administered 2020-07-02: 250 mL via INTRAVENOUS
  Filled 2020-07-02: qty 250

## 2020-07-02 NOTE — Progress Notes (Signed)
Symptom Management [facility]  Telephone:([phone] [phone]  Patient Care Team: Danelle Berry, NP as PCP - General (Nurse Practitioner) Dionisio David, MD (Cardiology) Sindy Guadeloupe, MD as Consulting Physician (Hematology and Oncology)   Name of the patient: Timothy Murray  [id number]  1945-10-15   Date of visit: 07/02/20  Reason for Consult: [name] is a 75 year old man with multiple medical problems including history of cirrhosis, hepatocellular carcinoma status post proton beam radiation in June 2021, and myelodysplasia on treatment with azacitidine.  Patient last saw Dr. Janese Banks on 06/23/2020 at which time he seemed to be at baseline.  He was having some difficulty ambulating due to bilateral lower extremity edema and baseline fatigue.  Patient was an add-on to my clinic schedule today at Dr. Elroy Channel request to discuss reported insomnia.  Patient was seen in infusion.  Patient has been taking trazodone nightly but no longer finds it helpful.  He reports sensation of involuntary leg movements, and occasional pins/needles in lower extremities.  He says the sensations are keeping him awake and inhibiting his initiation of sleep.  He is also tried melatonin but did not find it particularly helpful.   Patient denies depressive symptoms.  He denies anxiety.  Denies any neurologic complaints. Denies recent fevers or illnesses. Denies any easy bleeding or bruising. Reports good appetite and denies weight loss. Denies chest pain. Denies any nausea, vomiting, constipation, or diarrhea. Denies urinary complaints. Patient offers no further specific complaints today.  PAST MEDICAL HISTORY: Past Medical History:  Diagnosis Date   Arthritis    "lower back" (10/24/2012)   Coronary artery disease    GERD (gastroesophageal reflux disease)    High cholesterol    Hypertension    Liver cancer ([place] [place])    MDS (myelodysplastic syndrome) ([place])     Type II diabetes mellitus ([place])     PAST SURGICAL HISTORY:  Past Surgical History:  Procedure Laterality Date   APPENDECTOMY  2002   CARDIAC CATHETERIZATION  10/24/2012   CORONARY ARTERY BYPASS GRAFT N/A 10/26/2012   Procedure: CORONARY ARTERY BYPASS GRAFTING (CABG);  Surgeon: Melrose Nakayama, MD;  Location: [facility];  Service: Open Heart Surgery;  Laterality: N/A;  Times 3 using left internal mammary artery and endoscopically harvested right saphenous vein   ERCP N/A 04/25/2020   Procedure: ENDOSCOPIC RETROGRADE CHOLANGIOPANCREATOGRAPHY (ERCP);  Surgeon: Lucilla Lame, MD;  Location: [facility] ENDOSCOPY;  Service: Endoscopy;  Laterality: N/A;    HEMATOLOGY/ONCOLOGY HISTORY:  Oncology History  MDS (myelodysplastic syndrome) ([facility])  05/26/2020 Initial Diagnosis   MDS (myelodysplastic syndrome) ([facility])    05/27/2020 -  Chemotherapy    Patient is on Treatment Plan: MYELODYSPLASIA  AZACITIDINE SQ D1-7 Q28D         ALLERGIES:  is allergic to dome-paste bandage [wound dressings], soap & cleansers, and latex.  MEDICATIONS:  Current Outpatient Medications  Medication Sig Dispense Refill   acyclovir (ZOVIRAX) 400 MG tablet Take 1 tablet (400 mg total) by mouth 2 (two) times daily. 60 tablet 1   atorvastatin (LIPITOR) 40 MG tablet Take 1 tablet by mouth daily.     glimepiride (AMARYL) 1 MG tablet Take 1 mg by mouth daily.     levofloxacin (LEVAQUIN) 500 MG tablet Take 1 tablet (500 mg total) by mouth daily. 30 tablet 0   loratadine (CLARITIN) 10 MG tablet Take 5 mg by mouth daily as needed. Take half a tablet (5mg ) daily     LORazepam (ATIVAN) 0.5 MG  tablet Take 1 tablet (0.5 mg total) by mouth every 6 (six) hours as needed (Nausea or vomiting). (Patient not taking: No sig reported) 30 tablet 0   losartan (COZAAR) 25 MG tablet Take 25 mg by mouth daily.     metoprolol tartrate (LOPRESSOR) 25 MG tablet Take 25 mg by mouth daily.     nitroGLYCERIN (NITROSTAT) 0.3 MG SL tablet nitroglycerin 0.3 mg  sublingual tablet  PLACE ONE TABLET (0.3 MG DOSE) UNDER THE TONGUE EVERY 5 (FIVE) MINUTES AS NEEDED FOR CHEST PAIN. (Patient not taking: Reported on 06/23/2020)     omeprazole (PRILOSEC) 20 MG capsule Take 40 mg by mouth every morning.     ondansetron (ZOFRAN) 8 MG tablet Take 1 tablet (8 mg total) by mouth 2 (two) times daily as needed (Nausea or vomiting). (Patient not taking: No sig reported) 30 tablet 1   oxyCODONE (OXY IR/ROXICODONE) 5 MG immediate release tablet Take 1 tablet (5 mg total) by mouth every 8 (eight) hours as needed for severe pain. 90 tablet 0   prochlorperazine (COMPAZINE) 10 MG tablet Take 1 tablet (10 mg total) by mouth every 6 (six) hours as needed (Nausea or vomiting). (Patient not taking: No sig reported) 30 tablet 1   traZODone (DESYREL) 50 MG tablet Take 1-2 tablets (50-100 mg total) by mouth at bedtime. 90 tablet 0   No current facility-administered medications for this visit.    VITAL SIGNS: There were no vitals taken for this visit. There were no vitals filed for this visit.  Estimated body mass index is 26.85 kg/m as calculated from the following:   Height as of 06/23/20: 6' (1.829 m).   Weight as of 06/24/20: 198 lb (89.8 kg).  LABS: CBC:    Component Value Date/Time   WBC 0.6 (LL) 07/02/2020 0817   HGB 6.0 (L) 07/02/2020 0817   HGB 15.6 10/24/2012 1224   HCT 17.2 (L) 07/02/2020 0817   HCT 43.8 10/24/2012 1224   PLT 19 (LL) 07/02/2020 0817   PLT 165 10/24/2012 1224   MCV 97.2 07/02/2020 0817   MCV 91 10/24/2012 1224   NEUTROABS 0.2 (LL) 07/02/2020 0817   NEUTROABS 4.8 10/24/2012 1224   LYMPHSABS 0.3 (L) 07/02/2020 0817   LYMPHSABS 1.8 10/24/2012 1224   MONOABS 0.1 07/02/2020 0817   MONOABS 0.5 10/24/2012 1224   EOSABS 0.0 07/02/2020 0817   EOSABS 0.1 10/24/2012 1224   BASOSABS 0.0 07/02/2020 0817   BASOSABS 0.1 10/24/2012 1224   Comprehensive Metabolic Panel:    Component Value Date/Time   NA 135 07/02/2020 0817   NA 138 10/24/2012 1224    K 3.8 07/02/2020 0817   K 3.9 10/24/2012 1224   CL 103 07/02/2020 0817   CL 106 10/24/2012 1224   CO2 25 07/02/2020 0817   CO2 27 10/24/2012 1224   BUN 16 07/02/2020 0817   BUN 15 10/24/2012 1224   CREATININE 1.03 07/02/2020 0817   CREATININE 1.01 10/24/2012 1224   GLUCOSE 177 (H) 07/02/2020 0817   GLUCOSE 115 (H) 10/24/2012 1224   CALCIUM 8.6 (L) 07/02/2020 0817   CALCIUM 8.9 10/24/2012 1224   AST 26 07/02/2020 0817   AST 29 10/24/2012 1224   ALT 27 07/02/2020 0817   ALT 43 10/24/2012 1224   ALKPHOS 92 07/02/2020 0817   ALKPHOS 60 10/24/2012 1224   BILITOT 2.2 (H) 07/02/2020 0817   BILITOT 0.7 10/24/2012 1224   PROT 6.2 (L) 07/02/2020 0817   PROT 7.4 10/24/2012 1224   ALBUMIN 3.6 07/02/2020  8099   ALBUMIN 3.7 10/24/2012 1224    RADIOGRAPHIC STUDIES: No results found.  PERFORMANCE STATUS (ECOG) : 1 - Symptomatic but completely ambulatory  Review of Systems Unless otherwise noted, a complete review of systems is negative.  Physical Exam General: NAD Cardiovascular: regular rate and rhythm Pulmonary: clear ant fields Abdomen: soft, nontender, + bowel sounds GU: no suprapubic tenderness Extremities: no edema, no joint deformities Skin: no rashes Neurological: Weakness but otherwise nonfocal  Assessment and Plan- Patient is a 75 y.o. male with MDS on azacitidine who was seen in [facility] today for evaluation management of insomnia   Insomnia -patient is previously been tried on melatonin and is currently taking trazodone 50 to 100 mg nightly but still has difficulty initiating sleep.  He does not have difficulty maintaining sleep.  He says the primary issue is involuntary leg movements and occasional pins/needle sensation in his lower extremities.  It is possible that there is a neuropathic component to symptoms.  Discussed with Dr. Janese Banks and will start patient on gabapentin at bedtime.  Discussed sleep hygiene practices with patient.  RTC on 07/10/2020 with Dr. Janese Banks as previously  scheduled   Patient expressed understanding and was in agreement with this plan. He also understands that He can call clinic at any time with any questions, concerns, or complaints.   Thank you for allowing me to participate in the care of this very pleasant patient.   Time Total: 15 minutes  Visit consisted of counseling and education dealing with the complex and emotionally intense issues of symptom management and palliative care in the setting of serious and potentially life-threatening illness.Greater than 50%  of this time was spent counseling and coordinating care related to the above assessment and plan.  Signed by: Altha Harm, PhD, NP-C

## 2020-07-02 NOTE — Progress Notes (Signed)
Patient states his sleeping pills are not helping and that he is having restless nights. Reports he only had approx. 2 hours of sleep last night. Dr. Janese Banks made aware. Billey Chang, NP to see patient at chairside. Patient also reports he is having constipation. Patient reports having an RX for Miralax but has not used it. Encouraged patient to start using Miralax as directed by MD and educated on other natural ways to assist with constipation. Patient verbalizes understanding and denies any further questions or concerns.

## 2020-07-03 DIAGNOSIS — D469 Myelodysplastic syndrome, unspecified: Secondary | ICD-10-CM | POA: Diagnosis not present

## 2020-07-03 LAB — TYPE AND SCREEN
ABO/RH(D): A POS
Antibody Screen: NEGATIVE
Unit division: 0
Unit division: 0

## 2020-07-03 LAB — BPAM RBC
Blood Product Expiration Date: 202207252359
Blood Product Expiration Date: 202207252359
ISSUE DATE / TIME: 202206291105
ISSUE DATE / TIME: 202206291258
Unit Type and Rh: 6200
Unit Type and Rh: 6200

## 2020-07-08 ENCOUNTER — Inpatient Hospital Stay: Payer: Medicare HMO

## 2020-07-08 ENCOUNTER — Encounter: Payer: Self-pay | Admitting: Oncology

## 2020-07-09 ENCOUNTER — Inpatient Hospital Stay: Payer: Medicare HMO

## 2020-07-09 ENCOUNTER — Inpatient Hospital Stay: Payer: Medicare HMO | Attending: Oncology

## 2020-07-09 DIAGNOSIS — D469 Myelodysplastic syndrome, unspecified: Secondary | ICD-10-CM | POA: Insufficient documentation

## 2020-07-09 LAB — CBC WITH DIFFERENTIAL/PLATELET
Abs Immature Granulocytes: 0.01 10*3/uL (ref 0.00–0.07)
Basophils Absolute: 0 10*3/uL (ref 0.0–0.1)
Basophils Relative: 0 %
Eosinophils Absolute: 0 10*3/uL (ref 0.0–0.5)
Eosinophils Relative: 0 %
HCT: 26.3 % — ABNORMAL LOW (ref 39.0–52.0)
Hemoglobin: 8.6 g/dL — ABNORMAL LOW (ref 13.0–17.0)
Immature Granulocytes: 1 %
Lymphocytes Relative: 91 %
Lymphs Abs: 0.7 10*3/uL (ref 0.7–4.0)
MCH: 33.7 pg (ref 26.0–34.0)
MCHC: 32.7 g/dL (ref 30.0–36.0)
MCV: 103.1 fL — ABNORMAL HIGH (ref 80.0–100.0)
Monocytes Absolute: 0 10*3/uL — ABNORMAL LOW (ref 0.1–1.0)
Monocytes Relative: 3 %
Neutro Abs: 0 10*3/uL — CL (ref 1.7–7.7)
Neutrophils Relative %: 5 %
Platelets: 26 10*3/uL — CL (ref 150–400)
RBC: 2.55 MIL/uL — ABNORMAL LOW (ref 4.22–5.81)
RDW: 23 % — ABNORMAL HIGH (ref 11.5–15.5)
Smear Review: NORMAL
WBC: 0.8 10*3/uL — CL (ref 4.0–10.5)
nRBC: 2.6 % — ABNORMAL HIGH (ref 0.0–0.2)

## 2020-07-09 LAB — COMPREHENSIVE METABOLIC PANEL
ALT: 22 U/L (ref 0–44)
AST: 25 U/L (ref 15–41)
Albumin: 3.6 g/dL (ref 3.5–5.0)
Alkaline Phosphatase: 88 U/L (ref 38–126)
Anion gap: 5 (ref 5–15)
BUN: 11 mg/dL (ref 8–23)
CO2: 28 mmol/L (ref 22–32)
Calcium: 8.4 mg/dL — ABNORMAL LOW (ref 8.9–10.3)
Chloride: 106 mmol/L (ref 98–111)
Creatinine, Ser: 0.86 mg/dL (ref 0.61–1.24)
GFR, Estimated: >60 mL/min (ref >60)
Glucose, Bld: 97 mg/dL (ref 70–99)
Potassium: 4.1 mmol/L (ref 3.5–5.1)
Sodium: 139 mmol/L (ref 135–145)
Total Bilirubin: 1.8 mg/dL — ABNORMAL HIGH (ref 0.3–1.2)
Total Protein: 6.2 g/dL — ABNORMAL LOW (ref 6.5–8.1)

## 2020-07-09 LAB — SAMPLE TO BLOOD BANK

## 2020-07-10 ENCOUNTER — Inpatient Hospital Stay: Payer: Medicare HMO | Attending: Oncology | Admitting: Oncology

## 2020-07-10 ENCOUNTER — Inpatient Hospital Stay: Payer: Medicare HMO

## 2020-07-10 DIAGNOSIS — Z8505 Personal history of malignant neoplasm of liver: Secondary | ICD-10-CM | POA: Insufficient documentation

## 2020-07-10 DIAGNOSIS — D469 Myelodysplastic syndrome, unspecified: Secondary | ICD-10-CM | POA: Diagnosis not present

## 2020-07-10 DIAGNOSIS — Z5111 Encounter for antineoplastic chemotherapy: Secondary | ICD-10-CM | POA: Insufficient documentation

## 2020-07-13 ENCOUNTER — Encounter: Payer: Self-pay | Admitting: Oncology

## 2020-07-13 NOTE — Progress Notes (Signed)
I connected with Timothy Murray on 07/13/20 at  8:30 AM EDT by video enabled telemedicine visit and verified that I am speaking with the correct person using two identifiers.   I discussed the limitations, risks, security and privacy concerns of performing an evaluation and management service by telemedicine and the availability of in-person appointments. I also discussed with the patient that there may be a patient responsible charge related to this service. The patient expressed understanding and agreed to proceed.  Other persons participating in the visit and their role in the encounter:  none  Patient's location:  home Provider's location:  home  Chief Complaint:  discuss management strategy for high risk MDS  History of present illness: Patient is a 75 year old male with a history of cirrhosis and hepatocellular carcinoma for which she had proton beam radiation In June 2021.  He has baseline thrombocytopenia which was initially attributed to his cirrhosis.  He was found to have progressive pancytopenia with a steady decline in his white cell count since March 2022 when it was down to 3.4 with a platelet count of 53.  He was hospitalized sometime in April 2022 with acute biliary gallstone pancreatitis .  He underwent ERCP inpatient with platelet transfusion which showed sludge and biliary sphincterotomy was performed.  His bilirubin which was elevated up to 7.2 has come down to 2.4 presently.   However his pancytopenia has continued to worsen and presently white count is 1.1 with an H&H of 6.2/17.6 with an MCV of 102 and a platelet count of 33.Bone marrow biopsy showed evidence of dyspoietic changes in the erythroid and megakaryocyte excel lines associated with ring sideroblasts.  Definite increase in blastic cells not identified.  Overall features favor myelodysplastic syndrome.  Karyotype was normal but only for metaphase cells were analyzed which were not sufficient.  [facility] FISH MDS panel  showed deletion of 5 q. along with monosomy 7 and deletion 7 q. without excess blasts.  SF3B1 mutation negative.   Peripheral blood intellegin myeloid panel showed :   Detected     Change       Frequency (%) Impact  TP53   c.716A>C     p.Asn239Thr  19            Tier I  U2AF1  c.470A>C     p.Gln157Pro  17            Tier I  IDH1   c.315C>T     p.Gly105Gly  26            Tier II      With regards to his hepatocellular carcinoma patient had an MRI abdomen on 04/24/2020 which showed that his primary liver mass which was previously 4.3 x 3.6 cmWas similar in size at 4.5 x 3.5 cm.   Interval history: Patient reports that his stomach is upset on and off with levaquin and wonders how long he needs to take it.    Review of Systems  Constitutional:  Positive for malaise/fatigue. Negative for chills, fever and weight loss.  HENT:  Negative for congestion, ear discharge and nosebleeds.   Eyes:  Negative for blurred vision.  Respiratory:  Negative for cough, hemoptysis, sputum production, shortness of breath and wheezing.   Cardiovascular:  Negative for chest pain, palpitations, orthopnea and claudication.  Gastrointestinal:  Negative for abdominal pain, blood in stool, constipation, diarrhea, heartburn, melena, nausea and vomiting.  Genitourinary:  Negative for dysuria, flank pain, frequency, hematuria and urgency.  Musculoskeletal:  Negative for  back pain, joint pain and myalgias.  Skin:  Negative for rash.  Neurological:  Negative for dizziness, tingling, focal weakness, seizures, weakness and headaches.  Endo/Heme/Allergies:  Does not bruise/bleed easily.  Psychiatric/Behavioral:  Negative for depression and suicidal ideas. The patient does not have insomnia.    Allergies  Allergen Reactions   Dome-Paste Bandage [Wound Dressings]     All bandaids cause a rash   Soap & Cleansers     "liquid soap" antibacterial -rash   Latex Rash and Itching    Past Medical History:  Diagnosis Date    Arthritis    "lower back" (10/24/2012)   Coronary artery disease    GERD (gastroesophageal reflux disease)    High cholesterol    Hypertension    Liver cancer ([place])    MDS (myelodysplastic syndrome) ([place])    Type II diabetes mellitus ([place])     Past Surgical History:  Procedure Laterality Date   APPENDECTOMY  2002   CARDIAC CATHETERIZATION  10/24/2012   CORONARY ARTERY BYPASS GRAFT N/A 10/26/2012   Procedure: CORONARY ARTERY BYPASS GRAFTING (CABG);  Surgeon: Melrose Nakayama, MD;  Location: [facility];  Service: Open Heart Surgery;  Laterality: N/A;  Times 3 using left internal mammary artery and endoscopically harvested right saphenous vein   ERCP N/A 04/25/2020   Procedure: ENDOSCOPIC RETROGRADE CHOLANGIOPANCREATOGRAPHY (ERCP);  Surgeon: Lucilla Lame, MD;  Location: [facility] ENDOSCOPY;  Service: Endoscopy;  Laterality: N/A;    Social History   Socioeconomic History   Marital status: Married    Spouse name: Not on file   Number of children: Not on file   Years of education: Not on file   Highest education level: Not on file  Occupational History   Not on file  Tobacco Use   Smoking status: Former    Packs/day: 0.50    Years: 30.00    Pack years: 15.00    Types: Cigarettes    Quit date: 08/28/2001    Years since quitting: 18.8   Smokeless tobacco: Never  Vaping Use   Vaping Use: Never used  Substance and Sexual Activity   Alcohol use: No    Alcohol/week: 0.0 standard drinks    Comment: 10/24/2012 "stopped drinking alcohol in 2003; used to drink ~ 9 bottles beer/wk"   Drug use: No   Sexual activity: Yes  Other Topics Concern   Not on file  Social History Narrative   Not on file   Social Determinants of Health   Financial Resource Strain: Not on file  Food Insecurity: Not on file  Transportation Needs: Not on file  Physical Activity: Not on file  Stress: Not on file  Social Connections: Not on file  Intimate Partner Violence: Not on file    Family History   Problem Relation Age of Onset   Cancer Sister      Current Outpatient Medications:    acyclovir (ZOVIRAX) 400 MG tablet, Take 1 tablet (400 mg total) by mouth 2 (two) times daily., Disp: 60 tablet, Rfl: 1   atorvastatin (LIPITOR) 40 MG tablet, Take 1 tablet by mouth daily., Disp: , Rfl:    gabapentin (NEURONTIN) 300 MG capsule, Take 1 capsule (300 mg total) by mouth at bedtime., Disp: 30 capsule, Rfl: 0   glimepiride (AMARYL) 1 MG tablet, Take 1 mg by mouth daily., Disp: , Rfl:    levofloxacin (LEVAQUIN) 500 MG tablet, Take 1 tablet (500 mg total) by mouth daily., Disp: 30 tablet, Rfl: 0   loratadine (CLARITIN) 10 MG  tablet, Take 5 mg by mouth daily as needed. Take half a tablet (38m) daily, Disp: , Rfl:    LORazepam (ATIVAN) 0.5 MG tablet, Take 1 tablet (0.5 mg total) by mouth every 6 (six) hours as needed (Nausea or vomiting). (Patient not taking: No sig reported), Disp: 30 tablet, Rfl: 0   losartan (COZAAR) 25 MG tablet, Take 25 mg by mouth daily., Disp: , Rfl:    metoprolol tartrate (LOPRESSOR) 25 MG tablet, Take 25 mg by mouth daily., Disp: , Rfl:    nitroGLYCERIN (NITROSTAT) 0.3 MG SL tablet, nitroglycerin 0.3 mg sublingual tablet  PLACE ONE TABLET (0.3 MG DOSE) UNDER THE TONGUE EVERY 5 (FIVE) MINUTES AS NEEDED FOR CHEST PAIN. (Patient not taking: Reported on 06/23/2020), Disp: , Rfl:    omeprazole (PRILOSEC) 20 MG capsule, Take 40 mg by mouth every morning., Disp: , Rfl:    ondansetron (ZOFRAN) 8 MG tablet, Take 1 tablet (8 mg total) by mouth 2 (two) times daily as needed (Nausea or vomiting). (Patient not taking: No sig reported), Disp: 30 tablet, Rfl: 1   oxyCODONE (OXY IR/ROXICODONE) 5 MG immediate release tablet, Take 1 tablet (5 mg total) by mouth every 8 (eight) hours as needed for severe pain., Disp: 90 tablet, Rfl: 0   prochlorperazine (COMPAZINE) 10 MG tablet, Take 1 tablet (10 mg total) by mouth every 6 (six) hours as needed (Nausea or vomiting). (Patient not taking: No sig  reported), Disp: 30 tablet, Rfl: 1   traZODone (DESYREL) 50 MG tablet, Take 1-2 tablets (50-100 mg total) by mouth at bedtime., Disp: 90 tablet, Rfl: 0  No results found.  No images are attached to the encounter.   CMP Latest Ref Rng & Units 07/09/2020  Glucose 70 - 99 mg/dL 97  BUN 8 - 23 mg/dL 11  Creatinine 0.61 - 1.24 mg/dL 0.86  Sodium 135 - 145 mmol/L 139  Potassium 3.5 - 5.1 mmol/L 4.1  Chloride 98 - 111 mmol/L 106  CO2 22 - 32 mmol/L 28  Calcium 8.9 - 10.3 mg/dL 8.4(L)  Total Protein 6.5 - 8.1 g/dL 6.2(L)  Total Bilirubin 0.3 - 1.2 mg/dL 1.8(H)  Alkaline Phos 38 - 126 U/L 88  AST 15 - 41 U/L 25  ALT 0 - 44 U/L 22   CBC Latest Ref Rng & Units 07/09/2020  WBC 4.0 - 10.5 K/uL 0.8(LL)  Hemoglobin 13.0 - 17.0 g/dL 8.6(L)  Hematocrit 39.0 - 52.0 % 26.3(L)  Platelets 150 - 400 K/uL 26(LL)     Observation/objective:appears in no acute distress over video visit today. Breathing is non labored  Assessment and plan: Patient is a 75year old male with high risk MDS and TP53 mutation.  [facility] discuss a visit to further discuss his management strategy for MDS moving forward I [facility].  Dr. RMarvel Planessentially agrees with the ongoing plan of [place]  He has received 2 cycles so far and plan is to do 2 more cycles followed [facility]  Although adding venetoclax to Vidaza remains an option patient has significant pancytopenia requiring weekly transfusions which are likely to worsen with addition of venetoclax.  He is also significantly immunocompromised and currently on Levaquin and acyclovir prophylaxis.  I will await bone marrow biopsy findings after 4 cycles of Vidaza before deciding about venetoclax and further recommendations from Dr.  Marvel Plan.  Patient will continue to come to the cancer center weekly  for possible transfusion.  I am seeing him on 07/21/2020 for cycle 3-day 1 of Vidaza.   Follow-up instructions:as above  I discussed the assessment and treatment plan with the patient. The patient was provided an opportunity to ask questions and all were answered. The patient agreed with the plan and demonstrated an understanding of the instructions.   The patient was advised to call back or seek an in-person evaluation if the symptoms worsen or if the condition fails to improve as anticipated.  Visit Diagnosis: 1. MDS (myelodysplastic syndrome) ([place])     Dr. Randa Evens, MD, MPH [facility] at [facility] Tel- [phone] 07/13/2020 12:15 PM

## 2020-07-15 ENCOUNTER — Other Ambulatory Visit: Payer: Self-pay | Admitting: Oncology

## 2020-07-15 ENCOUNTER — Telehealth: Payer: Self-pay

## 2020-07-15 ENCOUNTER — Inpatient Hospital Stay: Payer: Medicare HMO

## 2020-07-15 DIAGNOSIS — D469 Myelodysplastic syndrome, unspecified: Secondary | ICD-10-CM

## 2020-07-15 DIAGNOSIS — Z5111 Encounter for antineoplastic chemotherapy: Secondary | ICD-10-CM | POA: Diagnosis present

## 2020-07-15 DIAGNOSIS — Z8505 Personal history of malignant neoplasm of liver: Secondary | ICD-10-CM | POA: Diagnosis not present

## 2020-07-15 LAB — SAMPLE TO BLOOD BANK

## 2020-07-15 LAB — CBC WITH DIFFERENTIAL/PLATELET
Abs Immature Granulocytes: 0 10*3/uL (ref 0.00–0.07)
Basophils Absolute: 0 10*3/uL (ref 0.0–0.1)
Basophils Relative: 3 %
Eosinophils Absolute: 0 10*3/uL (ref 0.0–0.5)
Eosinophils Relative: 0 %
HCT: 29.8 % — ABNORMAL LOW (ref 39.0–52.0)
Hemoglobin: 9.8 g/dL — ABNORMAL LOW (ref 13.0–17.0)
Immature Granulocytes: 0 %
Lymphocytes Relative: 93 %
Lymphs Abs: 0.8 10*3/uL (ref 0.7–4.0)
MCH: 34.6 pg — ABNORMAL HIGH (ref 26.0–34.0)
MCHC: 32.9 g/dL (ref 30.0–36.0)
MCV: 105.3 fL — ABNORMAL HIGH (ref 80.0–100.0)
Monocytes Absolute: 0 10*3/uL — ABNORMAL LOW (ref 0.1–1.0)
Monocytes Relative: 2 %
Neutro Abs: 0 10*3/uL — CL (ref 1.7–7.7)
Neutrophils Relative %: 2 %
Platelets: 117 10*3/uL — ABNORMAL LOW (ref 150–400)
RBC: 2.83 MIL/uL — ABNORMAL LOW (ref 4.22–5.81)
RDW: 23.6 % — ABNORMAL HIGH (ref 11.5–15.5)
WBC: 0.8 10*3/uL — CL (ref 4.0–10.5)
nRBC: 0 % (ref 0.0–0.2)

## 2020-07-15 NOTE — Telephone Encounter (Signed)
Phoned patient and informed him that based on his hg results today, he does not need a blood transfusion tomorrow. Pt verbalized understanding.

## 2020-07-16 ENCOUNTER — Inpatient Hospital Stay: Payer: Medicare HMO

## 2020-07-16 ENCOUNTER — Other Ambulatory Visit: Payer: Medicare HMO

## 2020-07-18 ENCOUNTER — Encounter: Payer: Self-pay | Admitting: Oncology

## 2020-07-19 ENCOUNTER — Other Ambulatory Visit: Payer: Self-pay | Admitting: Oncology

## 2020-07-20 ENCOUNTER — Other Ambulatory Visit: Payer: Self-pay | Admitting: *Deleted

## 2020-07-20 DIAGNOSIS — D469 Myelodysplastic syndrome, unspecified: Secondary | ICD-10-CM

## 2020-07-20 DIAGNOSIS — D649 Anemia, unspecified: Secondary | ICD-10-CM

## 2020-07-21 ENCOUNTER — Encounter: Payer: Self-pay | Admitting: Oncology

## 2020-07-21 ENCOUNTER — Inpatient Hospital Stay: Payer: Medicare HMO

## 2020-07-21 ENCOUNTER — Inpatient Hospital Stay (HOSPITAL_BASED_OUTPATIENT_CLINIC_OR_DEPARTMENT_OTHER): Payer: Medicare HMO | Admitting: Oncology

## 2020-07-21 VITALS — BP 126/60 | HR 57 | Temp 96.8°F | Resp 20 | Wt 194.3 lb

## 2020-07-21 DIAGNOSIS — Z8505 Personal history of malignant neoplasm of liver: Secondary | ICD-10-CM | POA: Diagnosis not present

## 2020-07-21 DIAGNOSIS — D649 Anemia, unspecified: Secondary | ICD-10-CM

## 2020-07-21 DIAGNOSIS — D469 Myelodysplastic syndrome, unspecified: Secondary | ICD-10-CM | POA: Diagnosis not present

## 2020-07-21 DIAGNOSIS — Z5111 Encounter for antineoplastic chemotherapy: Secondary | ICD-10-CM

## 2020-07-21 LAB — CBC WITH DIFFERENTIAL/PLATELET
Abs Immature Granulocytes: 0 10*3/uL (ref 0.00–0.07)
Band Neutrophils: 0 %
Basophils Absolute: 0 10*3/uL (ref 0.0–0.1)
Basophils Relative: 3 %
Blasts: 0 %
Eosinophils Absolute: 0 10*3/uL (ref 0.0–0.5)
Eosinophils Relative: 0 %
HCT: 34.5 % — ABNORMAL LOW (ref 39.0–52.0)
Hemoglobin: 11.2 g/dL — ABNORMAL LOW (ref 13.0–17.0)
Lymphocytes Relative: 69 %
Lymphs Abs: 0.6 10*3/uL — ABNORMAL LOW (ref 0.7–4.0)
MCH: 34.8 pg — ABNORMAL HIGH (ref 26.0–34.0)
MCHC: 32.5 g/dL (ref 30.0–36.0)
MCV: 107.1 fL — ABNORMAL HIGH (ref 80.0–100.0)
Metamyelocytes Relative: 0 %
Monocytes Absolute: 0.2 10*3/uL (ref 0.1–1.0)
Monocytes Relative: 18 %
Myelocytes: 0 %
Neutro Abs: 0.1 10*3/uL — CL (ref 1.7–7.7)
Neutrophils Relative %: 10 %
Other: 0 %
Platelets: 198 10*3/uL (ref 150–400)
Promyelocytes Relative: 0 %
RBC: 3.22 MIL/uL — ABNORMAL LOW (ref 4.22–5.81)
RDW: 20.5 % — ABNORMAL HIGH (ref 11.5–15.5)
Smear Review: ADEQUATE
WBC: 0.9 10*3/uL — CL (ref 4.0–10.5)
nRBC: 0 % (ref 0.0–0.2)
nRBC: 0 /100 WBC

## 2020-07-21 LAB — COMPREHENSIVE METABOLIC PANEL
ALT: 49 U/L — ABNORMAL HIGH (ref 0–44)
AST: 46 U/L — ABNORMAL HIGH (ref 15–41)
Albumin: 4.2 g/dL (ref 3.5–5.0)
Alkaline Phosphatase: 99 U/L (ref 38–126)
Anion gap: 7 (ref 5–15)
BUN: 14 mg/dL (ref 8–23)
CO2: 23 mmol/L (ref 22–32)
Calcium: 8.9 mg/dL (ref 8.9–10.3)
Chloride: 108 mmol/L (ref 98–111)
Creatinine, Ser: 0.88 mg/dL (ref 0.61–1.24)
GFR, Estimated: >60 mL/min (ref >60)
Glucose, Bld: 93 mg/dL (ref 70–99)
Potassium: 4 mmol/L (ref 3.5–5.1)
Sodium: 138 mmol/L (ref 135–145)
Total Bilirubin: 1.2 mg/dL (ref 0.3–1.2)
Total Protein: 7.3 g/dL (ref 6.5–8.1)

## 2020-07-21 LAB — SAMPLE TO BLOOD BANK

## 2020-07-21 MED ORDER — ONDANSETRON HCL 4 MG PO TABS
8.0000 mg | ORAL_TABLET | Freq: Once | ORAL | Status: AC
  Administered 2020-07-21: 8 mg via ORAL
  Filled 2020-07-21: qty 2

## 2020-07-21 MED ORDER — AZACITIDINE CHEMO SQ INJECTION
75.0000 mg/m2 | Freq: Once | INTRAMUSCULAR | Status: AC
  Administered 2020-07-21: 162.5 mg via SUBCUTANEOUS
  Filled 2020-07-21: qty 6.5

## 2020-07-21 NOTE — Progress Notes (Signed)
Per MD RN does not need to wait on CMP prior to administrating treatment. Treatment team and pt updated, all questions answered at this time.   Timothy Murray [employer]

## 2020-07-21 NOTE — Telephone Encounter (Signed)
Dianna can notarize it but you can ask him if he really needs it notarized

## 2020-07-21 NOTE — Progress Notes (Signed)
Hematology/Oncology Consult note [facility]  [phone] [phone]  Patient Care Team: Danelle Berry, NP as PCP - General (Nurse Practitioner) Dionisio David, MD (Cardiology) Sindy Guadeloupe, MD as Consulting Physician (Hematology and Oncology)   Name of the patient: Timothy Murray  [id number]  12-Jul-1945   Date of visit: 07/21/20  Diagnosis-  MDS with 5q del but high risk given TP53 mutation 2. [place] s/p thermal ablation  Chief complaint/ Reason for visit-on treatment assessment prior to cycle 3-day 1 of Vidaza  Heme/Onc history: Patient is a 75 year old male with a history of cirrhosis and hepatocellular carcinoma for which she had proton beam radiation In June 2021.  He has baseline thrombocytopenia which was initially attributed to his cirrhosis.  He was found to have progressive pancytopenia with a steady decline in his white cell count since March 2022 when it was down to 3.4 with a platelet count of 53.  He was hospitalized sometime in April 2022 with acute biliary gallstone pancreatitis .  He underwent ERCP inpatient with platelet transfusion which showed sludge and biliary sphincterotomy was performed.  His bilirubin which was elevated up to 7.2 has come down to 2.4 presently.   However his pancytopenia has continued to worsen and presently white count is 1.1 with an H&H of 6.2/17.6 with an MCV of 102 and a platelet count of 33.Bone marrow biopsy showed evidence of dyspoietic changes in the erythroid and megakaryocyte excel lines associated with ring sideroblasts.  Definite increase in blastic cells not identified.  Overall features favor myelodysplastic syndrome.  Karyotype was normal but only for metaphase cells were analyzed which were not sufficient.  [facility] FISH MDS panel showed deletion of 5 q. along with monosomy 7 and deletion 7 q. without excess blasts.  SF3B1 mutation negative.   Peripheral blood intellegin myeloid  panel showed :   Detected     Change       Frequency (%) Impact  TP53   c.716A>C     p.Asn239Thr  19            Tier I  U2AF1  c.470A>C     p.Gln157Pro  17            Tier I  IDH1   c.315C>T     p.Gly105Gly  59            Tier II      With regards to his hepatocellular carcinoma patient had an MRI abdomen on 04/24/2020 which showed that his primary liver mass which was previously 4.3 x 3.6 cmWas similar in size at 4.5 x 3.5 cm.   Patient was briefly on Revlimid for 5 q. deletion but after T p53 came back he was switched to [place] for 5 days every 28 days starting 05/27/2020   Interval history-patient is doing better overall.  Denies any mouth sores or fevers.  Denies any abdominal pain.  Energy levels are getting better and leg swelling is improving.  ECOG PS- 1 Pain scale- 0  Review of systems- Review of Systems  Constitutional:  Positive for malaise/fatigue. Negative for chills, fever and weight loss.  HENT:  Negative for congestion, ear discharge and nosebleeds.   Eyes:  Negative for blurred vision.  Respiratory:  Negative for cough, hemoptysis, sputum production, shortness of breath and wheezing.   Cardiovascular:  Negative for chest pain, palpitations, orthopnea and claudication.  Gastrointestinal:  Negative for abdominal pain, blood in stool, constipation, diarrhea, heartburn, melena,  nausea and vomiting.  Genitourinary:  Negative for dysuria, flank pain, frequency, hematuria and urgency.  Musculoskeletal:  Negative for back pain, joint pain and myalgias.  Skin:  Negative for rash.  Neurological:  Negative for dizziness, tingling, focal weakness, seizures, weakness and headaches.  Endo/Heme/Allergies:  Does not bruise/bleed easily.  Psychiatric/Behavioral:  Negative for depression and suicidal ideas. The patient does not have insomnia.       Allergies  Allergen Reactions   Dome-Paste Bandage [Wound Dressings]     All bandaids cause a rash   Soap & Cleansers     "liquid soap"  antibacterial -rash   Latex Rash and Itching     Past Medical History:  Diagnosis Date   Arthritis    "lower back" (10/24/2012)   Coronary artery disease    GERD (gastroesophageal reflux disease)    High cholesterol    Hypertension    Liver cancer ([place])    MDS (myelodysplastic syndrome) ([place])    Type II diabetes mellitus ([place])      Past Surgical History:  Procedure Laterality Date   APPENDECTOMY  2002   CARDIAC CATHETERIZATION  10/24/2012   CORONARY ARTERY BYPASS GRAFT N/A 10/26/2012   Procedure: CORONARY ARTERY BYPASS GRAFTING (CABG);  Surgeon: Melrose Nakayama, MD;  Location: [facility];  Service: Open Heart Surgery;  Laterality: N/A;  Times 3 using left internal mammary artery and endoscopically harvested right saphenous vein   ERCP N/A 04/25/2020   Procedure: ENDOSCOPIC RETROGRADE CHOLANGIOPANCREATOGRAPHY (ERCP);  Surgeon: Lucilla Lame, MD;  [facility] END[facility]COPY;  Service: Endoscopy;  Laterality: N/A;    Social History   Socioeconomic History   Marital status: Married    Spouse name: Not on file   Number of children: Not on file   Years of education: Not on file   Highest education level: Not on file  Occupational History   Not on file  Tobacco Use   Smoking status: Former    Packs/day: 0.50    Years: 30.00    Pack years: 15.00    Types: Cigarettes    Quit date: 08/28/2001    Years since quitting: 18.9   Smokeless tobacco: Never  Vaping Use   Vaping Use: Never used  Substance and Sexual Activity   Alcohol use: No    Alcohol/week: 0.0 standard drinks    Comment: 10/24/2012 "stopped drinking alcohol in 2003; used to drink ~ 9 bottles beer/wk"   Drug use: No   Sexual activity: Yes  Other Topics Concern   Not on file  Social History Narrative   Not on file   Social Determinants of Health   Financial Resource Strain: Not on file  Food Insecurity: Not on file  Transportation Needs: Not on file  Physical Activity: Not on file  Stress: Not on file   Social Connections: Not on file  Intimate Partner Violence: Not on file    Family History  Problem Relation Age of Onset   Cancer Sister      Current Outpatient Medications:    acyclovir (ZOVIRAX) 400 MG tablet, TAKE 1 TABLET BY MOUTH TWICE A DAY, Disp: 60 tablet, Rfl: 1   atorvastatin (LIPITOR) 40 MG tablet, Take 1 tablet by mouth daily., Disp: , Rfl:    gabapentin (NEURONTIN) 300 MG capsule, Take 1 capsule (300 mg total) by mouth at bedtime., Disp: 30 capsule, Rfl: 0   glimepiride (AMARYL) 1 MG tablet, Take 1 mg by mouth daily., Disp: , Rfl:    levofloxacin (LEVAQUIN) 500 MG  tablet, Take 1 tablet (500 mg total) by mouth daily., Disp: 30 tablet, Rfl: 0   loratadine (CLARITIN) 10 MG tablet, Take 5 mg by mouth daily as needed. Take half a tablet (62m) daily, Disp: , Rfl:    LORazepam (ATIVAN) 0.5 MG tablet, Take 1 tablet (0.5 mg total) by mouth every 6 (six) hours as needed (Nausea or vomiting). (Patient not taking: No sig reported), Disp: 30 tablet, Rfl: 0   losartan (COZAAR) 25 MG tablet, Take 25 mg by mouth daily., Disp: , Rfl:    metoprolol tartrate (LOPRESSOR) 25 MG tablet, Take 25 mg by mouth daily., Disp: , Rfl:    nitroGLYCERIN (NITROSTAT) 0.3 MG SL tablet, nitroglycerin 0.3 mg sublingual tablet  PLACE ONE TABLET (0.3 MG DOSE) UNDER THE TONGUE EVERY 5 (FIVE) MINUTES AS NEEDED FOR CHEST PAIN. (Patient not taking: No sig reported), Disp: , Rfl:    omeprazole (PRILOSEC) 20 MG capsule, Take 40 mg by mouth every morning., Disp: , Rfl:    ondansetron (ZOFRAN) 8 MG tablet, Take 1 tablet (8 mg total) by mouth 2 (two) times daily as needed (Nausea or vomiting). (Patient not taking: No sig reported), Disp: 30 tablet, Rfl: 1   oxyCODONE (OXY IR/ROXICODONE) 5 MG immediate release tablet, Take 1 tablet (5 mg total) by mouth every 8 (eight) hours as needed for severe pain., Disp: 90 tablet, Rfl: 0   prochlorperazine (COMPAZINE) 10 MG tablet, Take 1 tablet (10 mg total) by mouth every 6 (six) hours  as needed (Nausea or vomiting). (Patient not taking: No sig reported), Disp: 30 tablet, Rfl: 1   traZODone (DESYREL) 50 MG tablet, Take 1-2 tablets (50-100 mg total) by mouth at bedtime., Disp: 90 tablet, Rfl: 0  Physical exam:  Vitals:   07/21/20 1050  BP: 126/60  Pulse: (!) 57  Resp: 20  Temp: (!) 96.8 F (36 C)  TempSrc: Tympanic  Weight: 194 lb 4.8 oz (88.1 kg)   Physical Exam Constitutional:      General: He is not in acute distress. Cardiovascular:     Rate and Rhythm: Normal rate and regular rhythm.     Heart sounds: Normal heart sounds.  Pulmonary:     Effort: Pulmonary effort is normal.     Breath sounds: Normal breath sounds.  Abdominal:     General: Bowel sounds are normal.     Palpations: Abdomen is soft.  Skin:    General: Skin is warm and dry.  Neurological:     Mental Status: He is alert and oriented to person, place, and time.     CMP Latest Ref Rng & Units 07/21/2020  Glucose 70 - 99 mg/dL 93  BUN 8 - 23 mg/dL 14  Creatinine 0.61 - 1.24 mg/dL 0.88  Sodium 135 - 145 mmol/L 138  Potassium 3.5 - 5.1 mmol/L 4.0  Chloride 98 - 111 mmol/L 108  CO2 22 - 32 mmol/L 23  Calcium 8.9 - 10.3 mg/dL 8.9  Total Protein 6.5 - 8.1 g/dL 7.3  Total Bilirubin 0.3 - 1.2 mg/dL 1.2  Alkaline Phos 38 - 126 U/L 99  AST 15 - 41 U/L 46(H)  ALT 0 - 44 U/L 49(H)   CBC Latest Ref Rng & Units 07/21/2020  WBC 4.0 - 10.5 K/uL 0.9(LL)  Hemoglobin 13.0 - 17.0 g/dL 11.2(L)  Hematocrit 39.0 - 52.0 % 34.5(L)  Platelets 150 - 400 K/uL 198     Assessment and plan- Patient is a 75y.o. male with high risk MDS and T  p53 mutation.  He is here for on treatment assessment prior to cycle 3-day 1 of Vidaza  Counts okay to proceed with cycle 3-day 1 of Vidaza today and he will continue receiving day 1 to day 5 starting today.There has been a significant improvement in his counts from a platelet count that was down to 19 and is now up to 198.  He was transfusion dependent with a hemoglobin that  was close to 6 and now up to 11.2.  There is some improvement in his white count as well but he continues to be severely neutropenic with an [place] of 100.  Hopefully that should improve in due course as well.  He will continue to remain on Levaquin and acyclovir prophylaxis.  We will get in touch with [facility] Dr. Marvel Plan as they will be arranging a repeat bone marrow biopsy after 3 cycles of Vidaza.  I will see him back in 4 weeks for cycle 4.  We will continue to check weekly CBC with this cycle for possible blood or platelet transfusions   Visit Diagnosis 1. MDS (myelodysplastic syndrome) ([place])   2. Encounter for antineoplastic chemotherapy      Dr. Randa Evens, MD, MPH [facility] [phone] 07/21/2020 4:14 PM

## 2020-07-21 NOTE — Patient Instructions (Signed)
[facility]  Discharge Instructions: Thank you for choosing [facility] to provide your oncology and hematology care.  If you have a lab appointment with the [facility], please go directly to the [facility] and check in at the registration area.  Wear comfortable clothing and clothing appropriate for easy access to any Portacath or PICC line.   We strive to give you quality time with your provider. You may need to reschedule your appointment if you arrive late (15 or more minutes).  Arriving late affects you and other patients whose appointments are after yours.  Also, if you miss three or more appointments without notifying the office, you may be dismissed from the clinic at the provider's discretion.      For prescription refill requests, have your pharmacy contact our office and allow 72 hours for refills to be completed.    Today you received the following chemotherapy and/or immunotherapy agents Vidaza   To help prevent nausea and vomiting after your treatment, we encourage you to take your nausea medication as directed.  BELOW ARE SYMPTOMS THAT SHOULD BE REPORTED IMMEDIATELY: *FEVER GREATER THAN 100.4 F (38 C) OR HIGHER *CHILLS OR SWEATING *NAUSEA AND VOMITING THAT IS NOT CONTROLLED WITH YOUR NAUSEA MEDICATION *UNUSUAL SHORTNESS OF BREATH *UNUSUAL BRUISING OR BLEEDING *URINARY PROBLEMS (pain or burning when urinating, or frequent urination) *BOWEL PROBLEMS (unusual diarrhea, constipation, pain near the anus) TENDERNESS IN MOUTH AND THROAT WITH OR WITHOUT PRESENCE OF ULCERS (sore throat, sores in mouth, or a toothache) UNUSUAL RASH, SWELLING OR PAIN  UNUSUAL VAGINAL DISCHARGE OR ITCHING   Items with * indicate a potential emergency and should be followed up as soon as possible or go to the Emergency Department if any problems should occur.  Please show the CHEMOTHERAPY ALERT CARD or IMMUNOTHERAPY ALERT CARD at check-in to the  Emergency Department and triage nurse.  Should you have questions after your visit or need to cancel or reschedule your appointment, please contact [facility]  [phone] and follow the prompts.  Office hours are 8:00 a.m. to 4:30 p.m. Monday - Friday. Please note that voicemails left after 4:00 p.m. may not be returned until the following business day.  We are closed weekends and major holidays. You have access to a nurse at all times for urgent questions. Please call the main number to the clinic [phone] and follow the prompts.  For any non-urgent questions, you may also contact your provider using MyChart. We now offer e-Visits for anyone 49 and older to request care online for non-urgent symptoms. For details visit [email or web address].   Also download the MyChart app! Go to the app store, search "MyChart", open the app, select [facility], and log in with your MyChart username and password.  Due to Covid, a mask is required upon entering the hospital/clinic. If you do not have a mask, one will be given to you upon arrival. For doctor visits, patients may have 1 support person aged 62 or older with them. For treatment visits, patients cannot have anyone with them due to current Covid guidelines and our immunocompromised population.

## 2020-07-22 ENCOUNTER — Other Ambulatory Visit: Payer: Self-pay | Admitting: *Deleted

## 2020-07-22 ENCOUNTER — Other Ambulatory Visit: Payer: Self-pay

## 2020-07-22 ENCOUNTER — Inpatient Hospital Stay: Payer: Medicare HMO

## 2020-07-22 VITALS — BP 131/53 | HR 65 | Temp 96.6°F | Resp 16

## 2020-07-22 DIAGNOSIS — D469 Myelodysplastic syndrome, unspecified: Secondary | ICD-10-CM

## 2020-07-22 DIAGNOSIS — Z8505 Personal history of malignant neoplasm of liver: Secondary | ICD-10-CM | POA: Diagnosis not present

## 2020-07-22 DIAGNOSIS — Z5111 Encounter for antineoplastic chemotherapy: Secondary | ICD-10-CM | POA: Diagnosis not present

## 2020-07-22 MED ORDER — LEVOFLOXACIN 500 MG PO TABS: 500.0000 mg | ORAL_TABLET | Freq: Every day | ORAL | 0 refills | Status: DC

## 2020-07-22 MED ORDER — AZACITIDINE CHEMO SQ INJECTION
75.0000 mg/m2 | Freq: Once | INTRAMUSCULAR | Status: AC
  Administered 2020-07-22: 162.5 mg via SUBCUTANEOUS
  Filled 2020-07-22: qty 6.5

## 2020-07-22 MED ORDER — ONDANSETRON HCL 4 MG PO TABS
8.0000 mg | ORAL_TABLET | Freq: Once | ORAL | Status: AC
  Administered 2020-07-22: 8 mg via ORAL
  Filled 2020-07-22: qty 2

## 2020-07-22 NOTE — Patient Instructions (Signed)
[facility]  Discharge Instructions: Thank you for choosing [facility] to provide your oncology and hematology care.  If you have a lab appointment with the [facility], please go directly to the [facility] and check in at the registration area.  Wear comfortable clothing and clothing appropriate for easy access to any Portacath or PICC line.   We strive to give you quality time with your provider. You may need to reschedule your appointment if you arrive late (15 or more minutes).  Arriving late affects you and other patients whose appointments are after yours.  Also, if you miss three or more appointments without notifying the office, you may be dismissed from the clinic at the provider's discretion.      For prescription refill requests, have your pharmacy contact our office and allow 72 hours for refills to be completed.    Today you received the following chemotherapy and/or immunotherapy agents - azacitadine      To help prevent nausea and vomiting after your treatment, we encourage you to take your nausea medication as directed.  BELOW ARE SYMPTOMS THAT SHOULD BE REPORTED IMMEDIATELY: *FEVER GREATER THAN 100.4 F (38 C) OR HIGHER *CHILLS OR SWEATING *NAUSEA AND VOMITING THAT IS NOT CONTROLLED WITH YOUR NAUSEA MEDICATION *UNUSUAL SHORTNESS OF BREATH *UNUSUAL BRUISING OR BLEEDING *URINARY PROBLEMS (pain or burning when urinating, or frequent urination) *BOWEL PROBLEMS (unusual diarrhea, constipation, pain near the anus) TENDERNESS IN MOUTH AND THROAT WITH OR WITHOUT PRESENCE OF ULCERS (sore throat, sores in mouth, or a toothache) UNUSUAL RASH, SWELLING OR PAIN  UNUSUAL VAGINAL DISCHARGE OR ITCHING   Items with * indicate a potential emergency and should be followed up as soon as possible or go to the Emergency Department if any problems should occur.  Please show the CHEMOTHERAPY ALERT CARD or IMMUNOTHERAPY ALERT CARD at  check-in to the Emergency Department and triage nurse.  Should you have questions after your visit or need to cancel or reschedule your appointment, please contact [facility]  [phone] and follow the prompts.  Office hours are 8:00 a.m. to 4:30 p.m. Monday - Friday. Please note that voicemails left after 4:00 p.m. may not be returned until the following business day.  We are closed weekends and major holidays. You have access to a nurse at all times for urgent questions. Please call the main number to the clinic [phone] and follow the prompts.  For any non-urgent questions, you may also contact your provider using MyChart. We now offer e-Visits for anyone 47 and older to request care online for non-urgent symptoms. For details visit [email or web address].   Also download the MyChart app! Go to the app store, search "MyChart", open the app, select [facility], and log in with your MyChart username and password.  Due to Covid, a mask is required upon entering the hospital/clinic. If you do not have a mask, one will be given to you upon arrival. For doctor visits, patients may have 1 support person aged 100 or older with them. For treatment visits, patients cannot have anyone with them due to current Covid guidelines and our immunocompromised population.   Azacitidine suspension for injection (subcutaneous use) What is this medication? AZACITIDINE (ay [place]) is a chemotherapy drug. This medicine reduces the growth of cancer cells and can suppress the immune system. It is used fortreating myelodysplastic syndrome or some types of leukemia. This medicine may be used for other purposes; ask  your health care provider orpharmacist if you have questions. COMMON BRAND NAME(S): Vidaza What should I tell my care team before I take this medication? They need to know if you have any of these conditions: kidney disease liver disease liver tumors an unusual  or allergic reaction to azacitidine, mannitol, other medicines, foods, dyes, or preservatives pregnant or trying to get pregnant breast-feeding How should I use this medication? This medicine is for injection under the skin. It is administered in a hospitalor clinic by a specially trained health care professional. Talk to your pediatrician regarding the use of this medicine in children. Whilethis drug may be prescribed for selected conditions, precautions do apply. Overdosage: If you think you have taken too much of this medicine contact apoison control center or emergency room at once. NOTE: This medicine is only for you. Do not share this medicine with others. What if I miss a dose? It is important not to miss your dose. Call your doctor or health careprofessional if you are unable to keep an appointment. What may interact with this medication? Interactions have not been studied. Give your health care provider a list of all the medicines, herbs, non-prescription drugs, or dietary supplements you use. Also tell them if you smoke, drink alcohol, or use illegal drugs. Some items may interact with yourmedicine. This list may not describe all possible interactions. Give your health care provider a list of all the medicines, herbs, non-prescription drugs, or dietary supplements you use. Also tell them if you smoke, drink alcohol, or use illegaldrugs. Some items may interact with your medicine. What should I watch for while using this medication? Visit your doctor for checks on your progress. This drug may make you feel generally unwell. This is not uncommon, as chemotherapy can affect healthy cells as well as cancer cells. Report any side effects. Continue your course oftreatment even though you feel ill unless your doctor tells you to stop. In some cases, you may be given additional medicines to help with side effects.Follow all directions for their use. Call your doctor or health care professional for  advice if you get a fever, chills or sore throat, or other symptoms of a cold or flu. Do not treat yourself. This drug decreases your body's ability to fight infections. Try toavoid being around people who are sick. This medicine may increase your risk to bruise or bleed. Call your doctor orhealth care professional if you notice any unusual bleeding. You may need blood work done while you are taking this medicine. Do not become pregnant while taking this medicine and for 6 months after the last dose. Women should inform their doctor if they wish to become pregnant or think they might be pregnant. Men should not father a child while taking this medicine and for 3 months after the last dose. There is a potential for serious side effects to an unborn child. Talk to your health care professional or pharmacist for more information. Do not breast-feed an infant while taking thismedicine and for 1 week after the last dose. This medicine may interfere with the ability to have a child. Talk with yourdoctor or health care professional if you are concerned about your fertility. What side effects may I notice from receiving this medication? Side effects that you should report to your doctor or health care professionalas soon as possible: allergic reactions like skin rash, itching or hives, swelling of the face, lips, or tongue low blood counts - this medicine may decrease the number of white  blood cells, red blood cells and platelets. You may be at increased risk for infections and bleeding. signs of infection - fever or chills, cough, sore throat, pain passing urine signs of decreased platelets or bleeding - bruising, pinpoint red spots on the skin, black, tarry stools, blood in the urine signs of decreased red blood cells - unusually weak or tired, fainting spells, lightheadedness signs and symptoms of kidney injury like trouble passing urine or change in the amount of urine signs and symptoms of liver injury  like dark yellow or brown urine; general ill feeling or flu-like symptoms; light-colored stools; loss of appetite; nausea; right upper belly pain; unusually weak or tired; yellowing of the eyes or skin Side effects that usually do not require medical attention (report to yourdoctor or health care professional if they continue or are bothersome): constipation diarrhea nausea, vomiting pain or redness at the injection site unusually weak or tired This list may not describe all possible side effects. Call your doctor for medical advice about side effects. You may report side effects to FDA at1-800-FDA-1088. Where should I keep my medication? This drug is given in a hospital or clinic and will not be stored at home. NOTE: This sheet is a summary. It may not cover all possible information. If you have questions about this medicine, talk to your doctor, pharmacist, orhealth care provider.  2022 Elsevier/Gold Standard (2016-01-20 14:37:51)

## 2020-07-23 ENCOUNTER — Inpatient Hospital Stay: Payer: Medicare HMO

## 2020-07-23 VITALS — BP 129/64 | HR 72 | Temp 96.7°F

## 2020-07-23 DIAGNOSIS — Z5111 Encounter for antineoplastic chemotherapy: Secondary | ICD-10-CM | POA: Diagnosis not present

## 2020-07-23 DIAGNOSIS — Z8505 Personal history of malignant neoplasm of liver: Secondary | ICD-10-CM | POA: Diagnosis not present

## 2020-07-23 DIAGNOSIS — D469 Myelodysplastic syndrome, unspecified: Secondary | ICD-10-CM | POA: Diagnosis not present

## 2020-07-23 MED ORDER — AZACITIDINE CHEMO SQ INJECTION
75.0000 mg/m2 | Freq: Once | INTRAMUSCULAR | Status: AC
  Administered 2020-07-23: 162.5 mg via SUBCUTANEOUS
  Filled 2020-07-23: qty 6.5

## 2020-07-23 MED ORDER — ONDANSETRON HCL 4 MG PO TABS
8.0000 mg | ORAL_TABLET | Freq: Once | ORAL | Status: AC
  Administered 2020-07-23: 8 mg via ORAL
  Filled 2020-07-23: qty 2

## 2020-07-23 NOTE — Patient Instructions (Signed)
[facility] ONCOLOGY  Discharge Instructions: Thank you for choosing [facility] to provide your oncology and hematology care.  If you have a lab appointment with the [facility], please go directly to the [facility] and check in at the registration area.  Wear comfortable clothing and clothing appropriate for easy access to any Portacath or PICC line.   We strive to give you quality time with your provider. You may need to reschedule your appointment if you arrive late (15 or more minutes).  Arriving late affects you and other patients whose appointments are after yours.  Also, if you miss three or more appointments without notifying the office, you may be dismissed from the clinic at the provider's discretion.      For prescription refill requests, have your pharmacy contact our office and allow 72 hours for refills to be completed.    Today you received the following chemotherapy and/or immunotherapy agents : Vidaza   To help prevent nausea and vomiting after your treatment, we encourage you to take your nausea medication as directed.  BELOW ARE SYMPTOMS THAT SHOULD BE REPORTED IMMEDIATELY: *FEVER GREATER THAN 100.4 F (38 C) OR HIGHER *CHILLS OR SWEATING *NAUSEA AND VOMITING THAT IS NOT CONTROLLED WITH YOUR NAUSEA MEDICATION *UNUSUAL SHORTNESS OF BREATH *UNUSUAL BRUISING OR BLEEDING *URINARY PROBLEMS (pain or burning when urinating, or frequent urination) *BOWEL PROBLEMS (unusual diarrhea, constipation, pain near the anus) TENDERNESS IN MOUTH AND THROAT WITH OR WITHOUT PRESENCE OF ULCERS (sore throat, sores in mouth, or a toothache) UNUSUAL RASH, SWELLING OR PAIN  UNUSUAL VAGINAL DISCHARGE OR ITCHING   Items with * indicate a potential emergency and should be followed up as soon as possible or go to the Emergency Department if any problems should occur.  Please show the CHEMOTHERAPY ALERT CARD or IMMUNOTHERAPY ALERT CARD at check-in to  the Emergency Department and triage nurse.  Should you have questions after your visit or need to cancel or reschedule your appointment, please contact [facility]  [phone] and follow the prompts.  Office hours are 8:00 a.m. to 4:30 p.m. Monday - Friday. Please note that voicemails left after 4:00 p.m. may not be returned until the following business day.  We are closed weekends and major holidays. You have access to a nurse at all times for urgent questions. Please call the main number to the clinic [phone] and follow the prompts.  For any non-urgent questions, you may also contact your provider using MyChart. We now offer e-Visits for anyone 85 and older to request care online for non-urgent symptoms. For details visit [email or web address].   Also download the MyChart app! Go to the app store, search "MyChart", open the app, select [facility], and log in with your MyChart username and password.  Due to Covid, a mask is required upon entering the hospital/clinic. If you do not have a mask, one will be given to you upon arrival. For doctor visits, patients may have 1 support person aged 71 or older with them. For treatment visits, patients cannot have anyone with them due to current Covid guidelines and our immunocompromised population.

## 2020-07-24 ENCOUNTER — Other Ambulatory Visit: Payer: Self-pay | Admitting: *Deleted

## 2020-07-24 ENCOUNTER — Inpatient Hospital Stay: Payer: Medicare HMO

## 2020-07-24 VITALS — BP 136/59 | HR 76 | Temp 97.0°F

## 2020-07-24 DIAGNOSIS — D469 Myelodysplastic syndrome, unspecified: Secondary | ICD-10-CM | POA: Diagnosis not present

## 2020-07-24 DIAGNOSIS — Z8505 Personal history of malignant neoplasm of liver: Secondary | ICD-10-CM | POA: Diagnosis not present

## 2020-07-24 DIAGNOSIS — Z5111 Encounter for antineoplastic chemotherapy: Secondary | ICD-10-CM | POA: Diagnosis not present

## 2020-07-24 MED ORDER — AZACITIDINE CHEMO SQ INJECTION
75.0000 mg/m2 | Freq: Once | INTRAMUSCULAR | Status: AC
  Administered 2020-07-24: 162.5 mg via SUBCUTANEOUS
  Filled 2020-07-24: qty 6.5

## 2020-07-24 MED ORDER — ONDANSETRON HCL 4 MG PO TABS
8.0000 mg | ORAL_TABLET | Freq: Once | ORAL | Status: AC
  Administered 2020-07-24: 8 mg via ORAL
  Filled 2020-07-24: qty 2

## 2020-07-24 MED ORDER — HEPARIN SOD (PORK) LOCK FLUSH 100 UNIT/ML IV SOLN
INTRAVENOUS | Status: AC
  Filled 2020-07-24: qty 5

## 2020-07-24 NOTE — Patient Instructions (Signed)
[facility]  Discharge Instructions: Thank you for choosing [facility] to provide your oncology and hematology care.  If you have a lab appointment with the [facility], please go directly to the [facility] and check in at the registration area.  Wear comfortable clothing and clothing appropriate for easy access to any Portacath or PICC line.   We strive to give you quality time with your provider. You may need to reschedule your appointment if you arrive late (15 or more minutes).  Arriving late affects you and other patients whose appointments are after yours.  Also, if you miss three or more appointments without notifying the office, you may be dismissed from the clinic at the provider's discretion.      For prescription refill requests, have your pharmacy contact our office and allow 72 hours for refills to be completed.    Today you received the following chemotherapy and/or immunotherapy agents vidaza   To help prevent nausea and vomiting after your treatment, we encourage you to take your nausea medication as directed.  BELOW ARE SYMPTOMS THAT SHOULD BE REPORTED IMMEDIATELY: *FEVER GREATER THAN 100.4 F (38 C) OR HIGHER *CHILLS OR SWEATING *NAUSEA AND VOMITING THAT IS NOT CONTROLLED WITH YOUR NAUSEA MEDICATION *UNUSUAL SHORTNESS OF BREATH *UNUSUAL BRUISING OR BLEEDING *URINARY PROBLEMS (pain or burning when urinating, or frequent urination) *BOWEL PROBLEMS (unusual diarrhea, constipation, pain near the anus) TENDERNESS IN MOUTH AND THROAT WITH OR WITHOUT PRESENCE OF ULCERS (sore throat, sores in mouth, or a toothache) UNUSUAL RASH, SWELLING OR PAIN  UNUSUAL VAGINAL DISCHARGE OR ITCHING   Items with * indicate a potential emergency and should be followed up as soon as possible or go to the Emergency Department if any problems should occur.  Please show the CHEMOTHERAPY ALERT CARD or IMMUNOTHERAPY ALERT CARD at check-in to the  Emergency Department and triage nurse.  Should you have questions after your visit or need to cancel or reschedule your appointment, please contact [facility]  [phone] and follow the prompts.  Office hours are 8:00 a.m. to 4:30 p.m. Monday - Friday. Please note that voicemails left after 4:00 p.m. may not be returned until the following business day.  We are closed weekends and major holidays. You have access to a nurse at all times for urgent questions. Please call the main number to the clinic [phone] and follow the prompts.  For any non-urgent questions, you may also contact your provider using MyChart. We now offer e-Visits for anyone 57 and older to request care online for non-urgent symptoms. For details visit [email or web address].   Also download the MyChart app! Go to the app store, search "MyChart", open the app, select [facility], and log in with your MyChart username and password.  Due to Covid, a mask is required upon entering the hospital/clinic. If you do not have a mask, one will be given to you upon arrival. For doctor visits, patients may have 1 support person aged 69 or older with them. For treatment visits, patients cannot have anyone with them due to current Covid guidelines and our immunocompromised population.

## 2020-07-25 ENCOUNTER — Other Ambulatory Visit: Payer: Self-pay

## 2020-07-25 ENCOUNTER — Inpatient Hospital Stay: Payer: Medicare HMO

## 2020-07-25 VITALS — BP 125/60 | HR 85 | Temp 98.2°F | Resp 18 | Wt 191.0 lb

## 2020-07-25 DIAGNOSIS — D469 Myelodysplastic syndrome, unspecified: Secondary | ICD-10-CM

## 2020-07-25 DIAGNOSIS — Z5111 Encounter for antineoplastic chemotherapy: Secondary | ICD-10-CM | POA: Diagnosis not present

## 2020-07-25 DIAGNOSIS — Z8505 Personal history of malignant neoplasm of liver: Secondary | ICD-10-CM | POA: Diagnosis not present

## 2020-07-25 MED ORDER — AZACITIDINE CHEMO SQ INJECTION
75.0000 mg/m2 | Freq: Once | INTRAMUSCULAR | Status: AC
  Administered 2020-07-25: 162.5 mg via SUBCUTANEOUS
  Filled 2020-07-25: qty 6.5

## 2020-07-25 MED ORDER — ONDANSETRON HCL 4 MG PO TABS
8.0000 mg | ORAL_TABLET | Freq: Once | ORAL | Status: AC
  Administered 2020-07-25: 8 mg via ORAL
  Filled 2020-07-25: qty 2

## 2020-07-25 NOTE — Patient Instructions (Signed)
[facility]   Discharge Instructions: Thank you for choosing [facility] to provide your oncology and hematology care.  If you have a lab appointment with the [facility], please go directly to the [facility] and check in at the registration area.  Wear comfortable clothing and clothing appropriate for easy access to any Portacath or PICC line.   We strive to give you quality time with your provider. You may need to reschedule your appointment if you arrive late (15 or more minutes).  Arriving late affects you and other patients whose appointments are after yours.  Also, if you miss three or more appointments without notifying the office, you may be dismissed from the clinic at the provider's discretion.      For prescription refill requests, have your pharmacy contact our office and allow 72 hours for refills to be completed.    Today you received the following chemotherapy and/or immunotherapy agents - Vidaza      To help prevent nausea and vomiting after your treatment, we encourage you to take your nausea medication as directed.  BELOW ARE SYMPTOMS THAT SHOULD BE REPORTED IMMEDIATELY: *FEVER GREATER THAN 100.4 F (38 C) OR HIGHER *CHILLS OR SWEATING *NAUSEA AND VOMITING THAT IS NOT CONTROLLED WITH YOUR NAUSEA MEDICATION *UNUSUAL SHORTNESS OF BREATH *UNUSUAL BRUISING OR BLEEDING *URINARY PROBLEMS (pain or burning when urinating, or frequent urination) *BOWEL PROBLEMS (unusual diarrhea, constipation, pain near the anus) TENDERNESS IN MOUTH AND THROAT WITH OR WITHOUT PRESENCE OF ULCERS (sore throat, sores in mouth, or a toothache) UNUSUAL RASH, SWELLING OR PAIN  UNUSUAL VAGINAL DISCHARGE OR ITCHING   Items with * indicate a potential emergency and should be followed up as soon as possible or go to the Emergency Department if any problems should occur.  Please show the CHEMOTHERAPY ALERT CARD or IMMUNOTHERAPY ALERT CARD at check-in  to the Emergency Department and triage nurse.  Should you have questions after your visit or need to cancel or reschedule your appointment, please contact [facility]  [phone] and follow the prompts.  Office hours are 8:00 a.m. to 4:30 p.m. Monday - Friday. Please note that voicemails left after 4:00 p.m. may not be returned until the following business day.  We are closed weekends and major holidays. You have access to a nurse at all times for urgent questions. Please call the main number to the clinic [phone] and follow the prompts.  For any non-urgent questions, you may also contact your provider using MyChart. We now offer e-Visits for anyone 30 and older to request care online for non-urgent symptoms. For details visit [email or web address].   Also download the MyChart app! Go to the app store, search "MyChart", open the app, select [facility], and log in with your MyChart username and password.  Due to Covid, a mask is required upon entering the hospital/clinic. If you do not have a mask, one will be given to you upon arrival. For doctor visits, patients may have 1 support person aged 50 or older with them. For treatment visits, patients cannot have anyone with them due to current Covid guidelines and our immunocompromised population.   Ondansetron Tablets What is this medication? ONDANSETRON (on DAN se tron) prevents nausea and vomiting from chemotherapy, radiation, or surgery. It works by blocking substances in the body that may cause nausea or vomiting. It belongs to a group of medications calledantiemetics. This medicine may be used for other purposes; ask your health  care provider orpharmacist if you have questions. COMMON BRAND NAME(S): Zofran What should I tell my care team before I take this medication? They need to know if you have any of these conditions: Heart disease History of irregular heartbeat Liver disease Low levels of  magnesium or potassium in the blood An unusual or allergic reaction to ondansetron, granisetron, other medications, foods, dyes, or preservatives Pregnant or trying to get pregnant Breast-feeding How should I use this medication? Take this medication by mouth with a glass of water. Follow the directions on your prescription label. Take your doses at regular intervals. Do not take yourmedication more often than directed. Talk to your care team regarding the use of this medication in children.Special care may be needed. Overdosage: If you think you have taken too much of this medicine contact apoison control center or emergency room at once. NOTE: This medicine is only for you. Do not share this medicine with others. What if I miss a dose? If you miss a dose, take it as soon as you can. If it is almost time for yournext dose, take only that dose. Do not take double or extra doses. What may interact with this medication? Do not take this medication with any of the following: Apomorphine Certain medications for fungal infections like fluconazole, itraconazole, ketoconazole, posaconazole, voriconazole Cisapride Dronedarone Pimozide Thioridazine This medication may also interact with the following: Carbamazepine Certain medications for depression, anxiety, or psychotic disturbances Fentanyl Linezolid MAOIs like Carbex, Eldepryl, Marplan, Nardil, and Parnate Methylene blue (injected into a vein) Other medications that prolong the QT interval (cause an abnormal heart rhythm) like dofetilide, ziprasidone Phenytoin Rifampicin Tramadol This list may not describe all possible interactions. Give your health care provider a list of all the medicines, herbs, non-prescription drugs, or dietary supplements you use. Also tell them if you smoke, drink alcohol, or use illegaldrugs. Some items may interact with your medicine. What should I watch for while using this medication? Check with your care team  right away if you have any sign of an allergicreaction. What side effects may I notice from receiving this medication? Side effects that you should report to your care team as soon as possible: Allergic reactions-skin rash, itching, hives, swelling of the face, lips, tongue, or throat Bowel blockage-stomach cramping, unable to have a bowel movement or pass gas, loss of appetite, vomiting Chest pain (angina)-pain, pressure, or tightness in the chest, neck, back, or arms Heart rhythm changes-fast or irregular heartbeat, dizziness, feeling faint or lightheaded, chest pain, trouble breathing Irritability, confusion, fast or irregular heartbeat, muscle stiffness, twitching muscles, sweating, high fever, seizure, chills, vomiting, diarrhea, which may be signs of serotonin syndrome Side effects that usually do not require medical attention (report to your careteam if they continue or are bothersome): Constipation Diarrhea General discomfort and fatigue Headache This list may not describe all possible side effects. Call your doctor for medical advice about side effects. You may report side effects to FDA at1-800-FDA-1088. Where should I keep my medication? Keep out of the reach of children and pets. Store between 2 and 30 degrees C (36 and 86 degrees F). Throw away any unusedmedication after the expiration date. NOTE: This sheet is a summary. It may not cover all possible information. If you have questions about this medicine, talk to your doctor, pharmacist, orhealth care provider.  2022 Elsevier/Gold Standard (2020-03-04 14:24:46)  Azacitidine suspension for injection (subcutaneous use) What is this medication? AZACITIDINE ([place]) is a chemotherapy drug. This  medicine reduces the growth of cancer cells and can suppress the immune system. It is used fortreating myelodysplastic syndrome or some types of leukemia. This medicine may be used for other purposes; ask your health care provider  orpharmacist if you have questions. COMMON BRAND NAME(S): Vidaza What should I tell my care team before I take this medication? They need to know if you have any of these conditions: kidney disease liver disease liver tumors an unusual or allergic reaction to azacitidine, mannitol, other medicines, foods, dyes, or preservatives pregnant or trying to get pregnant breast-feeding How should I use this medication? This medicine is for injection under the skin. It is administered in a hospitalor clinic by a specially trained health care professional. Talk to your pediatrician regarding the use of this medicine in children. Whilethis drug may be prescribed for selected conditions, precautions do apply. Overdosage: If you think you have taken too much of this medicine contact apoison control center or emergency room at once. NOTE: This medicine is only for you. Do not share this medicine with others. What if I miss a dose? It is important not to miss your dose. Call your doctor or health careprofessional if you are unable to keep an appointment. What may interact with this medication? Interactions have not been studied. Give your health care provider a list of all the medicines, herbs, non-prescription drugs, or dietary supplements you use. Also tell them if you smoke, drink alcohol, or use illegal drugs. Some items may interact with yourmedicine. This list may not describe all possible interactions. Give your health care provider a list of all the medicines, herbs, non-prescription drugs, or dietary supplements you use. Also tell them if you smoke, drink alcohol, or use illegaldrugs. Some items may interact with your medicine. What should I watch for while using this medication? Visit your doctor for checks on your progress. This drug may make you feel generally unwell. This is not uncommon, as chemotherapy can affect healthy cells as well as cancer cells. Report any side effects. Continue your  course oftreatment even though you feel ill unless your doctor tells you to stop. In some cases, you may be given additional medicines to help with side effects.Follow all directions for their use. Call your doctor or health care professional for advice if you get a fever, chills or sore throat, or other symptoms of a cold or flu. Do not treat yourself. This drug decreases your body's ability to fight infections. Try toavoid being around people who are sick. This medicine may increase your risk to bruise or bleed. Call your doctor orhealth care professional if you notice any unusual bleeding. You may need blood work done while you are taking this medicine. Do not become pregnant while taking this medicine and for 6 months after the last dose. Women should inform their doctor if they wish to become pregnant or think they might be pregnant. Men should not father a child while taking this medicine and for 3 months after the last dose. There is a potential for serious side effects to an unborn child. Talk to your health care professional or pharmacist for more information. Do not breast-feed an infant while taking thismedicine and for 1 week after the last dose. This medicine may interfere with the ability to have a child. Talk with yourdoctor or health care professional if you are concerned about your fertility. What side effects may I notice from receiving this medication? Side effects that you should report to your doctor or  health care professionalas soon as possible: allergic reactions like skin rash, itching or hives, swelling of the face, lips, or tongue low blood counts - this medicine may decrease the number of [place] blood cells, red blood cells and platelets. You may be at increased risk for infections and bleeding. signs of infection - fever or chills, cough, sore throat, pain passing urine signs of decreased platelets or bleeding - bruising, pinpoint red spots on the skin, black, tarry stools,  blood in the urine signs of decreased red blood cells - unusually weak or tired, fainting spells, lightheadedness signs and symptoms of kidney injury like trouble passing urine or change in the amount of urine signs and symptoms of liver injury like dark yellow or brown urine; general ill feeling or flu-like symptoms; light-colored stools; loss of appetite; nausea; right upper belly pain; unusually weak or tired; yellowing of the eyes or skin Side effects that usually do not require medical attention (report to yourdoctor or health care professional if they continue or are bothersome): constipation diarrhea nausea, vomiting pain or redness at the injection site unusually weak or tired This list may not describe all possible side effects. Call your doctor for medical advice about side effects. You may report side effects to FDA at1-800-FDA-1088. Where should I keep my medication? This drug is given in a hospital or clinic and will not be stored at home. NOTE: This sheet is a summary. It may not cover all possible information. If you have questions about this medicine, talk to your doctor, pharmacist, orhealth care provider.  2022 Elsevier/Gold Standard (2016-01-20 14:37:51)

## 2020-07-25 NOTE — Progress Notes (Signed)
Pt received vidaza shot in clinic today. Tolerated well. No complaints at d/c.

## 2020-07-28 ENCOUNTER — Encounter: Payer: Self-pay | Admitting: Oncology

## 2020-07-28 ENCOUNTER — Other Ambulatory Visit: Payer: Medicare HMO

## 2020-07-28 ENCOUNTER — Inpatient Hospital Stay: Payer: Medicare HMO

## 2020-07-28 DIAGNOSIS — D469 Myelodysplastic syndrome, unspecified: Secondary | ICD-10-CM | POA: Diagnosis not present

## 2020-07-28 DIAGNOSIS — Z8505 Personal history of malignant neoplasm of liver: Secondary | ICD-10-CM | POA: Diagnosis not present

## 2020-07-28 DIAGNOSIS — Z5111 Encounter for antineoplastic chemotherapy: Secondary | ICD-10-CM | POA: Diagnosis not present

## 2020-07-28 LAB — CBC WITH DIFFERENTIAL/PLATELET
Abs Immature Granulocytes: 0.03 10*3/uL (ref 0.00–0.07)
Basophils Absolute: 0 10*3/uL (ref 0.0–0.1)
Basophils Relative: 1 %
Eosinophils Absolute: 0 10*3/uL (ref 0.0–0.5)
Eosinophils Relative: 1 %
HCT: 33.1 % — ABNORMAL LOW (ref 39.0–52.0)
Hemoglobin: 11.2 g/dL — ABNORMAL LOW (ref 13.0–17.0)
Immature Granulocytes: 1 %
Lymphocytes Relative: 34 %
Lymphs Abs: 1.2 10*3/uL (ref 0.7–4.0)
MCH: 35 pg — ABNORMAL HIGH (ref 26.0–34.0)
MCHC: 33.8 g/dL (ref 30.0–36.0)
MCV: 103.4 fL — ABNORMAL HIGH (ref 80.0–100.0)
Monocytes Absolute: 0.3 10*3/uL (ref 0.1–1.0)
Monocytes Relative: 7 %
Neutro Abs: 1.9 10*3/uL (ref 1.7–7.7)
Neutrophils Relative %: 56 %
Platelets: 132 10*3/uL — ABNORMAL LOW (ref 150–400)
RBC: 3.2 MIL/uL — ABNORMAL LOW (ref 4.22–5.81)
RDW: 17.9 % — ABNORMAL HIGH (ref 11.5–15.5)
WBC: 3.4 10*3/uL — ABNORMAL LOW (ref 4.0–10.5)
nRBC: 0 % (ref 0.0–0.2)

## 2020-07-28 LAB — COMPREHENSIVE METABOLIC PANEL
ALT: 32 U/L (ref 0–44)
AST: 32 U/L (ref 15–41)
Albumin: 4 g/dL (ref 3.5–5.0)
Alkaline Phosphatase: 93 U/L (ref 38–126)
Anion gap: 6 (ref 5–15)
BUN: 16 mg/dL (ref 8–23)
CO2: 26 mmol/L (ref 22–32)
Calcium: 8.4 mg/dL — ABNORMAL LOW (ref 8.9–10.3)
Chloride: 103 mmol/L (ref 98–111)
Creatinine, Ser: 1.11 mg/dL (ref 0.61–1.24)
GFR, Estimated: >60 mL/min (ref >60)
Glucose, Bld: 215 mg/dL — ABNORMAL HIGH (ref 70–99)
Potassium: 3.8 mmol/L (ref 3.5–5.1)
Sodium: 135 mmol/L (ref 135–145)
Total Bilirubin: 1 mg/dL (ref 0.3–1.2)
Total Protein: 6.8 g/dL (ref 6.5–8.1)

## 2020-07-28 LAB — SAMPLE TO BLOOD BANK

## 2020-07-29 ENCOUNTER — Other Ambulatory Visit: Payer: Medicare HMO

## 2020-07-29 ENCOUNTER — Other Ambulatory Visit: Payer: Self-pay

## 2020-07-29 ENCOUNTER — Ambulatory Visit
Admission: RE | Admit: 2020-07-29 | Discharge: 2020-07-29 | Disposition: A | Discharge status: Home / Self Care | Payer: Medicare HMO | Source: Ambulatory Visit | Attending: Hematology and Oncology | Admitting: Hematology and Oncology

## 2020-07-29 ENCOUNTER — Inpatient Hospital Stay: Payer: Medicare HMO

## 2020-07-29 ENCOUNTER — Ambulatory Visit: Payer: Medicare HMO

## 2020-07-29 DIAGNOSIS — C220 Liver cell carcinoma: Secondary | ICD-10-CM | POA: Insufficient documentation

## 2020-07-29 DIAGNOSIS — K7460 Unspecified cirrhosis of liver: Secondary | ICD-10-CM | POA: Diagnosis not present

## 2020-07-29 DIAGNOSIS — K8020 Calculus of gallbladder without cholecystitis without obstruction: Secondary | ICD-10-CM | POA: Diagnosis not present

## 2020-07-29 MED ORDER — GADOBUTROL 1 MMOL/ML IV SOLN
9.0000 mL | Freq: Once | INTRAVENOUS | Status: AC | PRN
  Administered 2020-07-29: 9 mL via INTRAVENOUS

## 2020-07-29 NOTE — Progress Notes (Deleted)
No blood transfusion needed today. Hgb on 7/25 was 11.2. Patient is aware.

## 2020-07-31 ENCOUNTER — Ambulatory Visit: Payer: Medicare HMO | Admitting: Hematology and Oncology

## 2020-08-01 ENCOUNTER — Other Ambulatory Visit: Payer: Self-pay

## 2020-08-04 ENCOUNTER — Inpatient Hospital Stay: Payer: Medicare HMO | Attending: Oncology

## 2020-08-04 ENCOUNTER — Other Ambulatory Visit: Payer: Self-pay

## 2020-08-04 DIAGNOSIS — Z5111 Encounter for antineoplastic chemotherapy: Secondary | ICD-10-CM | POA: Insufficient documentation

## 2020-08-04 DIAGNOSIS — D696 Thrombocytopenia, unspecified: Secondary | ICD-10-CM | POA: Diagnosis not present

## 2020-08-04 DIAGNOSIS — D469 Myelodysplastic syndrome, unspecified: Secondary | ICD-10-CM | POA: Diagnosis present

## 2020-08-04 LAB — COMPREHENSIVE METABOLIC PANEL
ALT: 35 U/L (ref 0–44)
AST: 35 U/L (ref 15–41)
Albumin: 3.9 g/dL (ref 3.5–5.0)
Alkaline Phosphatase: 80 U/L (ref 38–126)
Anion gap: 9 (ref 5–15)
BUN: 13 mg/dL (ref 8–23)
CO2: 26 mmol/L (ref 22–32)
Calcium: 8.7 mg/dL — ABNORMAL LOW (ref 8.9–10.3)
Chloride: 103 mmol/L (ref 98–111)
Creatinine, Ser: 0.98 mg/dL (ref 0.61–1.24)
GFR, Estimated: >60 mL/min (ref >60)
Glucose, Bld: 134 mg/dL — ABNORMAL HIGH (ref 70–99)
Potassium: 4 mmol/L (ref 3.5–5.1)
Sodium: 138 mmol/L (ref 135–145)
Total Bilirubin: 1.1 mg/dL (ref 0.3–1.2)
Total Protein: 6.8 g/dL (ref 6.5–8.1)

## 2020-08-04 LAB — CBC WITH DIFFERENTIAL/PLATELET
Abs Immature Granulocytes: 0.01 10*3/uL (ref 0.00–0.07)
Basophils Absolute: 0 10*3/uL (ref 0.0–0.1)
Basophils Relative: 1 %
Eosinophils Absolute: 0.1 10*3/uL (ref 0.0–0.5)
Eosinophils Relative: 2 %
HCT: 34.1 % — ABNORMAL LOW (ref 39.0–52.0)
Hemoglobin: 11.9 g/dL — ABNORMAL LOW (ref 13.0–17.0)
Immature Granulocytes: 0 %
Lymphocytes Relative: 33 %
Lymphs Abs: 0.9 10*3/uL (ref 0.7–4.0)
MCH: 36.1 pg — ABNORMAL HIGH (ref 26.0–34.0)
MCHC: 34.9 g/dL (ref 30.0–36.0)
MCV: 103.3 fL — ABNORMAL HIGH (ref 80.0–100.0)
Monocytes Absolute: 0.2 10*3/uL (ref 0.1–1.0)
Monocytes Relative: 9 %
Neutro Abs: 1.6 10*3/uL — ABNORMAL LOW (ref 1.7–7.7)
Neutrophils Relative %: 55 %
Platelets: 92 10*3/uL — ABNORMAL LOW (ref 150–400)
RBC: 3.3 MIL/uL — ABNORMAL LOW (ref 4.22–5.81)
RDW: 17.9 % — ABNORMAL HIGH (ref 11.5–15.5)
WBC: 2.8 10*3/uL — ABNORMAL LOW (ref 4.0–10.5)
nRBC: 0 % (ref 0.0–0.2)

## 2020-08-04 LAB — SAMPLE TO BLOOD BANK

## 2020-08-05 ENCOUNTER — Inpatient Hospital Stay: Payer: Medicare HMO

## 2020-08-06 ENCOUNTER — Other Ambulatory Visit: Payer: Self-pay | Admitting: Oncology

## 2020-08-11 ENCOUNTER — Inpatient Hospital Stay: Payer: Medicare HMO

## 2020-08-11 ENCOUNTER — Other Ambulatory Visit: Payer: Self-pay

## 2020-08-11 DIAGNOSIS — D696 Thrombocytopenia, unspecified: Secondary | ICD-10-CM | POA: Diagnosis not present

## 2020-08-11 DIAGNOSIS — D469 Myelodysplastic syndrome, unspecified: Secondary | ICD-10-CM

## 2020-08-11 DIAGNOSIS — Z5111 Encounter for antineoplastic chemotherapy: Secondary | ICD-10-CM | POA: Diagnosis not present

## 2020-08-11 LAB — CBC WITH DIFFERENTIAL/PLATELET
Abs Immature Granulocytes: 0.02 10*3/uL (ref 0.00–0.07)
Basophils Absolute: 0 10*3/uL (ref 0.0–0.1)
Basophils Relative: 1 %
Eosinophils Absolute: 0.1 10*3/uL (ref 0.0–0.5)
Eosinophils Relative: 3 %
HCT: 35.3 % — ABNORMAL LOW (ref 39.0–52.0)
Hemoglobin: 12.1 g/dL — ABNORMAL LOW (ref 13.0–17.0)
Immature Granulocytes: 1 %
Lymphocytes Relative: 38 %
Lymphs Abs: 1 10*3/uL (ref 0.7–4.0)
MCH: 35.8 pg — ABNORMAL HIGH (ref 26.0–34.0)
MCHC: 34.3 g/dL (ref 30.0–36.0)
MCV: 104.4 fL — ABNORMAL HIGH (ref 80.0–100.0)
Monocytes Absolute: 0.3 10*3/uL (ref 0.1–1.0)
Monocytes Relative: 11 %
Neutro Abs: 1.2 10*3/uL — ABNORMAL LOW (ref 1.7–7.7)
Neutrophils Relative %: 46 %
Platelets: 71 10*3/uL — ABNORMAL LOW (ref 150–400)
RBC: 3.38 MIL/uL — ABNORMAL LOW (ref 4.22–5.81)
RDW: 17.4 % — ABNORMAL HIGH (ref 11.5–15.5)
WBC: 2.7 10*3/uL — ABNORMAL LOW (ref 4.0–10.5)
nRBC: 0 % (ref 0.0–0.2)

## 2020-08-12 ENCOUNTER — Inpatient Hospital Stay: Payer: Medicare HMO

## 2020-08-12 ENCOUNTER — Telehealth: Payer: Self-pay | Admitting: *Deleted

## 2020-08-12 NOTE — Telephone Encounter (Signed)
Called patient and let him know that his hemoglobin is over 12 today and he does not need a blood transfusion.  Patient aware and he will not come to his visit today

## 2020-08-13 LAB — SAMPLE TO BLOOD BANK

## 2020-08-14 DIAGNOSIS — D469 Myelodysplastic syndrome, unspecified: Secondary | ICD-10-CM | POA: Diagnosis not present

## 2020-08-18 ENCOUNTER — Inpatient Hospital Stay: Payer: Medicare HMO

## 2020-08-18 ENCOUNTER — Inpatient Hospital Stay: Payer: Medicare HMO | Admitting: Oncology

## 2020-08-18 ENCOUNTER — Encounter: Payer: Self-pay | Admitting: Oncology

## 2020-08-18 ENCOUNTER — Other Ambulatory Visit: Payer: Self-pay | Admitting: *Deleted

## 2020-08-18 VITALS — BP 135/63 | HR 56 | Temp 96.5°F | Resp 18 | Wt 189.5 lb

## 2020-08-18 DIAGNOSIS — D469 Myelodysplastic syndrome, unspecified: Secondary | ICD-10-CM

## 2020-08-18 DIAGNOSIS — Z5111 Encounter for antineoplastic chemotherapy: Secondary | ICD-10-CM

## 2020-08-18 DIAGNOSIS — D696 Thrombocytopenia, unspecified: Secondary | ICD-10-CM | POA: Diagnosis not present

## 2020-08-18 LAB — COMPREHENSIVE METABOLIC PANEL
ALT: 31 U/L (ref 0–44)
AST: 33 U/L (ref 15–41)
Albumin: 4.2 g/dL (ref 3.5–5.0)
Alkaline Phosphatase: 79 U/L (ref 38–126)
Anion gap: 8 (ref 5–15)
BUN: 13 mg/dL (ref 8–23)
CO2: 27 mmol/L (ref 22–32)
Calcium: 8.9 mg/dL (ref 8.9–10.3)
Chloride: 103 mmol/L (ref 98–111)
Creatinine, Ser: 0.93 mg/dL (ref 0.61–1.24)
GFR, Estimated: >60 mL/min (ref >60)
Glucose, Bld: 174 mg/dL — ABNORMAL HIGH (ref 70–99)
Potassium: 4 mmol/L (ref 3.5–5.1)
Sodium: 138 mmol/L (ref 135–145)
Total Bilirubin: 1.5 mg/dL — ABNORMAL HIGH (ref 0.3–1.2)
Total Protein: 7 g/dL (ref 6.5–8.1)

## 2020-08-18 LAB — CBC WITH DIFFERENTIAL/PLATELET
Abs Immature Granulocytes: 0.02 10*3/uL (ref 0.00–0.07)
Basophils Absolute: 0.1 10*3/uL (ref 0.0–0.1)
Basophils Relative: 5 %
Eosinophils Absolute: 0 10*3/uL (ref 0.0–0.5)
Eosinophils Relative: 1 %
HCT: 39.4 % (ref 39.0–52.0)
Hemoglobin: 13.4 g/dL (ref 13.0–17.0)
Immature Granulocytes: 1 %
Lymphocytes Relative: 33 %
Lymphs Abs: 0.8 10*3/uL (ref 0.7–4.0)
MCH: 35.9 pg — ABNORMAL HIGH (ref 26.0–34.0)
MCHC: 34 g/dL (ref 30.0–36.0)
MCV: 105.6 fL — ABNORMAL HIGH (ref 80.0–100.0)
Monocytes Absolute: 0.1 10*3/uL (ref 0.1–1.0)
Monocytes Relative: 6 %
Neutro Abs: 1.3 10*3/uL — ABNORMAL LOW (ref 1.7–7.7)
Neutrophils Relative %: 54 %
Platelets: 69 10*3/uL — ABNORMAL LOW (ref 150–400)
RBC: 3.73 MIL/uL — ABNORMAL LOW (ref 4.22–5.81)
RDW: 15.7 % — ABNORMAL HIGH (ref 11.5–15.5)
WBC: 2.4 10*3/uL — ABNORMAL LOW (ref 4.0–10.5)
nRBC: 0 % (ref 0.0–0.2)

## 2020-08-18 MED ORDER — ONDANSETRON HCL 4 MG PO TABS
8.0000 mg | ORAL_TABLET | Freq: Once | ORAL | Status: AC
  Administered 2020-08-18: 8 mg via ORAL
  Filled 2020-08-18: qty 2

## 2020-08-18 MED ORDER — OMEPRAZOLE 40 MG PO CPDR: 40.0000 mg | DELAYED_RELEASE_CAPSULE | Freq: Two times a day (BID) | ORAL | 0 refills | Status: DC

## 2020-08-18 MED ORDER — LEVOFLOXACIN 500 MG PO TABS: 500.0000 mg | ORAL_TABLET | Freq: Every day | ORAL | 0 refills | Status: DC

## 2020-08-18 MED ORDER — AZACITIDINE CHEMO SQ INJECTION
75.0000 mg/m2 | Freq: Once | INTRAMUSCULAR | Status: AC
  Administered 2020-08-18: 162.5 mg via SUBCUTANEOUS
  Filled 2020-08-18: qty 6.5

## 2020-08-18 NOTE — Progress Notes (Signed)
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 [place] s/p thermal ablation  Chief complaint/ Reason for visit-on treatment assessment prior to cycle 4-day 1 of Vidaza  Heme/Onc history: Patient is a 75 year old male with a history of cirrhosis and hepatocellular carcinoma for which she had proton beam radiation In June 2021.  He has baseline thrombocytopenia which was initially attributed to his cirrhosis.  He was found to have progressive pancytopenia with a steady decline in his white cell count since March 2022 when it was down to 3.4 with a platelet count of 53.  He was hospitalized sometime in April 2022 with acute biliary gallstone pancreatitis .  He underwent ERCP inpatient with platelet transfusion which showed sludge and biliary sphincterotomy was performed.  His bilirubin which was elevated up to 7.2 has come down to 2.4 presently.   However his pancytopenia has continued to worsen and presently white count is 1.1 with an H&H of 6.2/17.6 with an MCV of 102 and a platelet count of 33.Bone marrow biopsy showed evidence of dyspoietic changes in the erythroid and megakaryocyte excel lines associated with ring sideroblasts.  Definite increase in blastic cells not identified.  Overall features favor myelodysplastic syndrome.  Karyotype was normal but only for metaphase cells were analyzed which were not sufficient.  [facility] FISH MDS panel showed deletion of 5 q. along with monosomy 7 and deletion 7 q. without excess blasts.  SF3B1 mutation negative.   Peripheral blood intellegin myeloid  panel showed :   Detected     Change       Frequency (%) Impact  TP53   c.716A>C     p.Asn239Thr  19            Tier I  U2AF1  c.470A>C     p.Gln157Pro  17            Tier I  IDH1   c.315C>T     p.Gly105Gly  35            Tier II      With regards to his hepatocellular carcinoma patient had an MRI abdomen on 04/24/2020 which showed that his primary liver mass which was previously 4.3 x 3.6 cmWas similar in size at 4.5 x 3.5 cm.    Patient was briefly on Revlimid for 5 q. deletion but after T p53 came back he was switched to Frohna for 5 days every 28 days starting 05/27/2020   Patient was seen by Dr. Marvel [facility] at [facility] for second opinion and he also had a repeat bone marrow biopsyDone on 08/14/2020.  Biopsy showed normocellular bone marrow with erythroid predominant trilineage hematopoiesis.  Megakaryocytes were typical in number with few hypolobated forms.  Full sequence of orderly maturation noted in both granulopoiesis and erythropoiesis.  3% blasts were seen by manual differential.  Interval history-patient is tolerating treatment well so far.  Leg swelling is slowly improving.  Energy levels are better.  ECOG PS- 1 Pain scale- 0   Review of systems- Review of Systems  Constitutional:  Negative for chills, fever, malaise/fatigue and weight loss.  HENT:  Negative for  congestion, ear discharge and nosebleeds.   Eyes:  Negative for blurred vision.  Respiratory:  Negative for cough, hemoptysis, sputum production, shortness of breath and wheezing.   Cardiovascular:  Negative for chest pain, palpitations, orthopnea and claudication.  Gastrointestinal:  Negative for abdominal pain, blood in stool, constipation, diarrhea, heartburn, melena, nausea and vomiting.  Genitourinary:  Negative for dysuria, flank pain, frequency, hematuria and urgency.  Musculoskeletal:  Negative for back pain, joint pain and myalgias.  Skin:  Negative for rash.  Neurological:  Negative for dizziness, tingling, focal  weakness, seizures, weakness and headaches.  Endo/Heme/Allergies:  Does not bruise/bleed easily.  Psychiatric/Behavioral:  Negative for depression and suicidal ideas. The patient does not have insomnia.     Allergies  Allergen Reactions   Dome-Paste Bandage [Wound Dressings]     All bandaids cause a rash   Soap & Cleansers     "liquid soap" antibacterial -rash   Latex Rash and Itching     Past Medical History:  Diagnosis Date   Arthritis    "lower back" (10/24/2012)   Coronary artery disease    GERD (gastroesophageal reflux disease)    High cholesterol    Hypertension    Liver cancer ([place])    MDS (myelodysplastic syndrome) ([place])    Type II diabetes mellitus ([place])      Past Surgical History:  Procedure Laterality Date   APPENDECTOMY  2002   CARDIAC CATHETERIZATION  10/24/2012   CORONARY ARTERY BYPASS GRAFT N/A 10/26/2012   Procedure: CORONARY ARTERY BYPASS GRAFTING (CABG);  Surgeon: Melrose Nakayama, MD;  Location: [place];  Service: Open Heart Surgery;  Laterality: N/A;  Times 3 using left internal mammary artery and endoscopically harvested right saphenous vein   ERCP N/A 04/25/2020   Procedure: ENDOSCOPIC RETROGRADE CHOLANGIOPANCREATOGRAPHY (ERCP);  Surgeon: Lucilla Lame, MD;  [facility] END[facility]COPY;  Service: Endoscopy;  Laterality: N/A;    Social History   Socioeconomic History   Marital status: Married    Spouse name: Not on file   Number of children: Not on file   Years of education: Not on file   Highest education level: Not on file  Occupational History   Not on file  Tobacco Use   Smoking status: Former    Packs/day: 0.50    Years: 30.00    Pack years: 15.00    Types: Cigarettes    Quit date: 08/28/2001    Years since quitting: 18.9   Smokeless tobacco: Never  Vaping Use   Vaping Use: Never used  Substance and Sexual Activity   Alcohol use: No    Alcohol/week: 0.0 standard drinks    Comment: 10/24/2012 "stopped drinking alcohol in 2003; used to  drink ~ 9 bottles beer/wk"   Drug use: No   Sexual activity: Yes  Other Topics Concern   Not on file  Social History Narrative   Not on file   Social Determinants of Health   Financial Resource Strain: Not on file  Food Insecurity: Not on file  Transportation Needs: Not on file  Physical Activity: Not on file  Stress: Not on file  Social Connections: Not on file  Intimate Partner Violence: Not on file    Family History  Problem Relation Age of Onset   Cancer Sister      Current Outpatient Medications:    acyclovir (ZOVIRAX) 400 MG tablet, TAKE 1 TABLET BY MOUTH TWICE A DAY, Disp: 60 tablet, Rfl: 1   atorvastatin (LIPITOR) 40 MG tablet, Take 1 tablet by  mouth daily., Disp: , Rfl:    gabapentin (NEURONTIN) 300 MG capsule, Take 1 capsule (300 mg total) by mouth at bedtime., Disp: 30 capsule, Rfl: 0   glimepiride (AMARYL) 1 MG tablet, Take 1 mg by mouth daily., Disp: , Rfl:    loratadine (CLARITIN) 10 MG tablet, Take 5 mg by mouth daily as needed. Take half a tablet (86m) daily, Disp: , Rfl:    losartan (COZAAR) 25 MG tablet, Take 25 mg by mouth daily., Disp: , Rfl:    metoprolol tartrate (LOPRESSOR) 25 MG tablet, Take 25 mg by mouth daily., Disp: , Rfl:    omeprazole (PRILOSEC) 20 MG capsule, Take 40 mg by mouth every morning., Disp: , Rfl:    oxyCODONE (OXY IR/ROXICODONE) 5 MG immediate release tablet, Take 1 tablet (5 mg total) by mouth every 8 (eight) hours as needed for severe pain., Disp: 90 tablet, Rfl: 0   traZODone (DESYREL) 50 MG tablet, TAKE 1-2 TABLETS BY MOUTH AT BEDTIME., Disp: 180 tablet, Rfl: 1   levofloxacin (LEVAQUIN) 500 MG tablet, Take 1 tablet (500 mg total) by mouth daily., Disp: 30 tablet, Rfl: 0   LORazepam (ATIVAN) 0.5 MG tablet, Take 1 tablet (0.5 mg total) by mouth every 6 (six) hours as needed (Nausea or vomiting). (Patient not taking: No sig reported), Disp: 30 tablet, Rfl: 0   nitroGLYCERIN (NITROSTAT) 0.3 MG SL tablet, nitroglycerin 0.3 mg sublingual  tablet  PLACE ONE TABLET (0.3 MG DOSE) UNDER THE TONGUE EVERY 5 (FIVE) MINUTES AS NEEDED FOR CHEST PAIN. (Patient not taking: No sig reported), Disp: , Rfl:    omeprazole (PRILOSEC) 40 MG capsule, Take 1 capsule (40 mg total) by mouth 2 (two) times daily., Disp: 60 capsule, Rfl: 0   ondansetron (ZOFRAN) 8 MG tablet, Take 1 tablet (8 mg total) by mouth 2 (two) times daily as needed (Nausea or vomiting). (Patient not taking: No sig reported), Disp: 30 tablet, Rfl: 1   prochlorperazine (COMPAZINE) 10 MG tablet, Take 1 tablet (10 mg total) by mouth every 6 (six) hours as needed (Nausea or vomiting). (Patient not taking: No sig reported), Disp: 30 tablet, Rfl: 1  Physical exam:  Vitals:   08/18/20 1026  BP: 135/63  Pulse: (!) 56  Resp: 18  Temp: (!) 96.5 F (35.8 C)  TempSrc: Tympanic  SpO2: 98%  Weight: 189 lb 8 oz (86 kg)   Physical Exam HENT:     Head: Normocephalic and atraumatic.     Mouth/Throat:     Mouth: Mucous membranes are moist.     Pharynx: Oropharynx is clear.  Cardiovascular:     Rate and Rhythm: Normal rate and regular rhythm.     Heart sounds: Normal heart sounds.  Pulmonary:     Effort: Pulmonary effort is normal.     Breath sounds: Normal breath sounds.  Abdominal:     General: Bowel sounds are normal.     Palpations: Abdomen is soft.  Musculoskeletal:     Comments: Trace bilateral edema  Skin:    General: Skin is warm and dry.  Neurological:     Mental Status: He is alert and oriented to person, place, and time.     CMP Latest Ref Rng & Units 08/18/2020  Glucose 70 - 99 mg/dL 174(H)  BUN 8 - 23 mg/dL 13  Creatinine 0.61 - 1.24 mg/dL 0.93  Sodium 135 - 145 mmol/L 138  Potassium 3.5 - 5.1 mmol/L 4.0  Chloride 98 - 111 mmol/L 103  CO2 22 -  32 mmol/L 27  Calcium 8.9 - 10.3 mg/dL 8.9  Total Protein 6.5 - 8.1 g/dL 7.0  Total Bilirubin 0.3 - 1.2 mg/dL 1.5(H)  Alkaline Phos 38 - 126 U/L 79  AST 15 - 41 U/L 33  ALT 0 - 44 U/L 31   CBC Latest Ref Rng & Units  08/18/2020  WBC 4.0 - 10.5 K/uL 2.4(L)  Hemoglobin 13.0 - 17.0 g/dL 13.4  Hematocrit 39.0 - 52.0 % 39.4  Platelets 150 - 400 K/uL 69(L)    No images are attached to the encounter.  MR Abdomen W Wo Contrast  Result Date: 07/30/2020 CLINICAL DATA:  Hepatocellular carcinoma, assess treatment response EXAM: MRI ABDOMEN WITHOUT AND WITH CONTRAST TECHNIQUE: Multiplanar multisequence MR imaging of the abdomen was performed both before and after the administration of intravenous contrast. CONTRAST:  28m GADAVIST GADOBUTROL 1 MMOL/ML IV SOLN COMPARISON:  04/24/2020 FINDINGS: Lower chest: No acute findings. Hepatobiliary: Coarse, nodular hepatic contour and parenchymal enhancement, with hypertrophy of the left lobe. Slight interval reduction in size of a hypoenhancing mass of the anterior right lobe of the liver, hepatic segment VI/VII, measuring 3.1 x 2.9 cm, previously 3.8 x 3.1 cm (series 22, image 31). Small gallstones in the gallbladder. Mild enlargement of the common bile duct measuring up to 8 mm, similar to prior examination. Pancreas: No mass, inflammatory changes, or other parenchymal abnormality identified. Spleen:  Mild splenomegaly, maximum span 14.3 cm. Adrenals/Urinary Tract: No masses identified. Fluid signal cyst of the superior pole of the left kidney. No evidence of hydronephrosis. Stomach/Bowel: Visualized portions within the abdomen are unremarkable. Vascular/Lymphatic: No pathologically enlarged lymph nodes identified. No abdominal aortic aneurysm demonstrated. Other:  None. Musculoskeletal: No suspicious bone lesions identified. IMPRESSION: 1. Slight interval reduction in size of a hypoenhancing mass of the anterior right lobe of the liver, hepatic segment VI/VII, consistent with treatment response. 2. No new liver lesions. 3. Cirrhosis and splenomegaly. 4. Cholelithiasis. 5. Mild enlargement of the common bile duct, measuring up to 8 mm, similar to prior examination, without obstructing  etiology identified to the ampulla. Electronically Signed   By: AEddie CandleM.D.   On: 07/30/2020 08:25     Assessment and plan- Patient is a 75y.o. male with high risk MDS and T p53 mutation.  He is here for on treatment assessment prior to cycle 4-day 1 of Vidaza  I did [facility] the [facility]  His initial bone marrow that was done here showed a hypercellular marrow with dyspoietic changesNormal.  His second Barrow now shows cellularity with orderly maturation of both erythroid and granulopoiesis precursors.  Some hypolobated forms worse noted in megakaryocytes.  Overall patient has responded very well to [place] his hemoglobin has gone from 6-13.4 presently.  Neutropenia has also improved.  Patient's platelet count was 198 at the start of cycle 3 of Vidaza and has gone down to 69 today.  I will still proceed with cycle 4 of Vidaza day 1 to day 5 starting today.  Suspect platelet recovery is lagging behind granulopoiesis again hemoglobin recovery.  He also has an in person visit with Dr. RMarvel Planlater this week.  Patient did not have any significant blast noted on his first bone marrow but was noted to have 3% blasts on his second marrow.  This will likely need to be monitored with a repeat bone marrow biopsy in 3 to 6 months time.  Will check CBC weekly for possible platelet transfusion  and I will see him back in 4 weeks for cycle 5-day 1 of Vidaza.  He will continue acyclovir and levofloxacin prophylaxis for now until his white cell count normalizes.  He still has some mild neutropenia  History of hepatocellular carcinoma: Presently his MDS takes precedence.  Will consider repeating MRI abdomen sometime in early next year.  His last MRI in July 2022 showed decrease in the size of the anterior right lobe liver mass after liver directed therapy.   Visit Diagnosis 1. Thrombocytopenia ([place])   2. Encounter for antineoplastic chemotherapy   3. MDS  (myelodysplastic syndrome) ([place])      Dr. Randa Evens, MD, MPH [facility] at [facility] [phone] 08/18/2020 4:37 PM

## 2020-08-18 NOTE — Progress Notes (Signed)
Per MD ok to treat with ANC 1.3 and plts 69

## 2020-08-18 NOTE — Patient Instructions (Signed)
[facility]COLOG[facility]  Discharge Instructions: Thank you for choosing [facility] to provide your oncology and hematology care.  If you have a lab appointment with the [facility], please go directly to the [facility] and check in at the registration area.  Wear comfortable clothing and clothing appropriate for easy access to any Portacath or PICC line.   We strive to give you quality time with your provider. You may need to reschedule your appointment if you arrive late (15 or more minutes).  Arriving late affects you and other patients whose appointments are after yours.  Also, if you miss three or more appointments without notifying the office, you may be dismissed from the clinic at the provider's discretion.      For prescription refill requests, have your pharmacy contact our office and allow 72 hours for refills to be completed.    Today you received the following chemotherapy and/or immunotherapy agents Vidaza      To help prevent nausea and vomiting after your treatment, we encourage you to take your nausea medication as directed.  BELOW ARE SYMPTOMS THAT SHOULD BE REPORTED IMMEDIATELY: *FEVER GREATER THAN 100.4 F (38 C) OR HIGHER *CHILLS OR SWEATING *NAUSEA AND VOMITING THAT IS NOT CONTROLLED WITH YOUR NAUSEA MEDICATION *UNUSUAL SHORTNESS OF BREATH *UNUSUAL BRUISING OR BLEEDING *URINARY PROBLEMS (pain or burning when urinating, or frequent urination) *BOWEL PROBLEMS (unusual diarrhea, constipation, pain near the anus) TENDERNESS IN MOUTH AND THROAT WITH OR WITHOUT PRESENCE OF ULCERS (sore throat, sores in mouth, or a toothache) UNUSUAL RASH, SWELLING OR PAIN  UNUSUAL VAGINAL DISCHARGE OR ITCHING   Items with * indicate a potential emergency and should be followed up as soon as possible or go to the Emergency Department if any problems should occur.  Please show the CHEMOTHERAPY ALERT CARD or IMMUNOTHERAPY ALERT CARD at check-in to  the Emergency Department and triage nurse.  Should you have questions after your visit or need to cancel or reschedule your appointment, please contact [facility]  [phone] and follow the prompts.  Office hours are 8:00 a.m. to 4:30 p.m. Monday - Friday. Please note that voicemails left after 4:00 p.m. may not be returned until the following business day.  We are closed weekends and major holidays. You have access to a nurse at all times for urgent questions. Please call the main number to the clinic [phone] and follow the prompts.  For any non-urgent questions, you may also contact your provider using MyChart. We now offer e-Visits for anyone 3 and older to request care online for non-urgent symptoms. For details visit [email or web address].   Also download the MyChart app! Go to the app store, search "MyChart", open the app, select [facility], and log in with your MyChart username and password.  Due to Covid, a mask is required upon entering the hospital/clinic. If you do not have a mask, one will be given to you upon arrival. For doctor visits, patients may have 1 support person aged 64 or older with them. For treatment visits, patients cannot have anyone with them due to current Covid guidelines and our immunocompromised population.

## 2020-08-19 ENCOUNTER — Other Ambulatory Visit: Payer: Self-pay

## 2020-08-19 ENCOUNTER — Inpatient Hospital Stay: Payer: Medicare HMO

## 2020-08-19 VITALS — BP 132/67 | HR 66 | Temp 96.9°F | Resp 16

## 2020-08-19 DIAGNOSIS — D696 Thrombocytopenia, unspecified: Secondary | ICD-10-CM | POA: Diagnosis not present

## 2020-08-19 DIAGNOSIS — D469 Myelodysplastic syndrome, unspecified: Secondary | ICD-10-CM | POA: Diagnosis not present

## 2020-08-19 DIAGNOSIS — Z5111 Encounter for antineoplastic chemotherapy: Secondary | ICD-10-CM | POA: Diagnosis not present

## 2020-08-19 MED ORDER — ONDANSETRON HCL 4 MG PO TABS
8.0000 mg | ORAL_TABLET | Freq: Once | ORAL | Status: AC
  Administered 2020-08-19: 8 mg via ORAL
  Filled 2020-08-19: qty 2

## 2020-08-19 MED ORDER — AZACITIDINE CHEMO SQ INJECTION
75.0000 mg/m2 | Freq: Once | INTRAMUSCULAR | Status: AC
  Administered 2020-08-19: 162.5 mg via SUBCUTANEOUS
  Filled 2020-08-19: qty 6.5

## 2020-08-19 NOTE — Patient Instructions (Signed)
[facility] ONCOLOGY  Discharge Instructions: Thank you for choosing [facility] to provide your oncology and hematology care.  If you have a lab appointment with the [facility], please go directly to the [facility] and check in at the registration area.  Wear comfortable clothing and clothing appropriate for easy access to any Portacath or PICC line.   We strive to give you quality time with your provider. You may need to reschedule your appointment if you arrive late (15 or more minutes).  Arriving late affects you and other patients whose appointments are after yours.  Also, if you miss three or more appointments without notifying the office, you may be dismissed from the clinic at the provider's discretion.      For prescription refill requests, have your pharmacy contact our office and allow 72 hours for refills to be completed.    Today you received the following chemotherapy and/or immunotherapy agents - azacitadine      To help prevent nausea and vomiting after your treatment, we encourage you to take your nausea medication as directed.  BELOW ARE SYMPTOMS THAT SHOULD BE REPORTED IMMEDIATELY: *FEVER GREATER THAN 100.4 F (38 C) OR HIGHER *CHILLS OR SWEATING *NAUSEA AND VOMITING THAT IS NOT CONTROLLED WITH YOUR NAUSEA MEDICATION *UNUSUAL SHORTNESS OF BREATH *UNUSUAL BRUISING OR BLEEDING *URINARY PROBLEMS (pain or burning when urinating, or frequent urination) *BOWEL PROBLEMS (unusual diarrhea, constipation, pain near the anus) TENDERNESS IN MOUTH AND THROAT WITH OR WITHOUT PRESENCE OF ULCERS (sore throat, sores in mouth, or a toothache) UNUSUAL RASH, SWELLING OR PAIN  UNUSUAL VAGINAL DISCHARGE OR ITCHING   Items with * indicate a potential emergency and should be followed up as soon as possible or go to the Emergency Department if any problems should occur.  Please show the CHEMOTHERAPY ALERT CARD or IMMUNOTHERAPY ALERT CARD at  check-in to the Emergency Department and triage nurse.  Should you have questions after your visit or need to cancel or reschedule your appointment, please contact [facility]  [phone] and follow the prompts.  Office hours are 8:00 a.m. to 4:30 p.m. Monday - Friday. Please note that voicemails left after 4:00 p.m. may not be returned until the following business day.  We are closed weekends and major holidays. You have access to a nurse at all times for urgent questions. Please call the main number to the clinic [phone] and follow the prompts.  For any non-urgent questions, you may also contact your provider using MyChart. We now offer e-Visits for anyone 25 and older to request care online for non-urgent symptoms. For details visit [email or web address].   Also download the MyChart app! Go to the app store, search "MyChart", open the app, select [facility], and log in with your MyChart username and password.  Due to Covid, a mask is required upon entering the hospital/clinic. If you do not have a mask, one will be given to you upon arrival. For doctor visits, patients may have 1 support person aged 76 or older with them. For treatment visits, patients cannot have anyone with them due to current Covid guidelines and our immunocompromised population.   Azacitidine suspension for injection (subcutaneous use) What is this medication? AZACITIDINE (ay Harahan) is a chemotherapy drug. This medicine reduces the growth of cancer cells and can suppress the immune system. It is used fortreating myelodysplastic syndrome or some types of leukemia. This medicine may be used for other purposes; ask  your health care provider orpharmacist if you have questions. COMMON BRAND NAME(S): Vidaza What should I tell my care team before I take this medication? They need to know if you have any of these conditions: kidney disease liver disease liver tumors an unusual  or allergic reaction to azacitidine, mannitol, other medicines, foods, dyes, or preservatives pregnant or trying to get pregnant breast-feeding How should I use this medication? This medicine is for injection under the skin. It is administered in a hospitalor clinic by a specially trained health care professional. Talk to your pediatrician regarding the use of this medicine in children. Whilethis drug may be prescribed for selected conditions, precautions do apply. Overdosage: If you think you have taken too much of this medicine contact apoison control center or emergency room at once. NOTE: This medicine is only for you. Do not share this medicine with others. What if I miss a dose? It is important not to miss your dose. Call your doctor or health careprofessional if you are unable to keep an appointment. What may interact with this medication? Interactions have not been studied. Give your health care provider a list of all the medicines, herbs, non-prescription drugs, or dietary supplements you use. Also tell them if you smoke, drink alcohol, or use illegal drugs. Some items may interact with yourmedicine. This list may not describe all possible interactions. Give your health care provider a list of all the medicines, herbs, non-prescription drugs, or dietary supplements you use. Also tell them if you smoke, drink alcohol, or use illegaldrugs. Some items may interact with your medicine. What should I watch for while using this medication? Visit your doctor for checks on your progress. This drug may make you feel generally unwell. This is not uncommon, as chemotherapy can affect healthy cells as well as cancer cells. Report any side effects. Continue your course oftreatment even though you feel ill unless your doctor tells you to stop. In some cases, you may be given additional medicines to help with side effects.Follow all directions for their use. Call your doctor or health care professional for  advice if you get a fever, chills or sore throat, or other symptoms of a cold or flu. Do not treat yourself. This drug decreases your body's ability to fight infections. Try toavoid being around people who are sick. This medicine may increase your risk to bruise or bleed. Call your doctor orhealth care professional if you notice any unusual bleeding. You may need blood work done while you are taking this medicine. Do not become pregnant while taking this medicine and for 6 months after the last dose. Women should inform their doctor if they wish to become pregnant or think they might be pregnant. Men should not father a child while taking this medicine and for 3 months after the last dose. There is a potential for serious side effects to an unborn child. Talk to your health care professional or pharmacist for more information. Do not breast-feed an infant while taking thismedicine and for 1 week after the last dose. This medicine may interfere with the ability to have a child. Talk with yourdoctor or health care professional if you are concerned about your fertility. What side effects may I notice from receiving this medication? Side effects that you should report to your doctor or health care professionalas soon as possible: allergic reactions like skin rash, itching or hives, swelling of the face, lips, or tongue low blood counts - this medicine may decrease the number of white  blood cells, red blood cells and platelets. You may be at increased risk for infections and bleeding. signs of infection - fever or chills, cough, sore throat, pain passing urine signs of decreased platelets or bleeding - bruising, pinpoint red spots on the skin, black, tarry stools, blood in the urine signs of decreased red blood cells - unusually weak or tired, fainting spells, lightheadedness signs and symptoms of kidney injury like trouble passing urine or change in the amount of urine signs and symptoms of liver injury  like dark yellow or brown urine; general ill feeling or flu-like symptoms; light-colored stools; loss of appetite; nausea; right upper belly pain; unusually weak or tired; yellowing of the eyes or skin Side effects that usually do not require medical attention (report to yourdoctor or health care professional if they continue or are bothersome): constipation diarrhea nausea, vomiting pain or redness at the injection site unusually weak or tired This list may not describe all possible side effects. Call your doctor for medical advice about side effects. You may report side effects to FDA at1-800-FDA-1088. Where should I keep my medication? This drug is given in a hospital or clinic and will not be stored at home. NOTE: This sheet is a summary. It may not cover all possible information. If you have questions about this medicine, talk to your doctor, pharmacist, orhealth care provider.  2022 Elsevier/Gold Standard (2016-01-20 14:37:51)

## 2020-08-20 ENCOUNTER — Inpatient Hospital Stay: Payer: Medicare HMO

## 2020-08-20 VITALS — BP 121/57 | HR 57 | Temp 97.7°F

## 2020-08-20 DIAGNOSIS — D469 Myelodysplastic syndrome, unspecified: Secondary | ICD-10-CM

## 2020-08-20 DIAGNOSIS — D696 Thrombocytopenia, unspecified: Secondary | ICD-10-CM | POA: Diagnosis not present

## 2020-08-20 DIAGNOSIS — Z5111 Encounter for antineoplastic chemotherapy: Secondary | ICD-10-CM | POA: Diagnosis not present

## 2020-08-20 MED ORDER — ONDANSETRON HCL 4 MG PO TABS
8.0000 mg | ORAL_TABLET | Freq: Once | ORAL | Status: AC
  Administered 2020-08-20: 8 mg via ORAL
  Filled 2020-08-20: qty 2

## 2020-08-20 MED ORDER — AZACITIDINE CHEMO SQ INJECTION
75.0000 mg/m2 | Freq: Once | INTRAMUSCULAR | Status: AC
  Administered 2020-08-20: 162.5 mg via SUBCUTANEOUS
  Filled 2020-08-20: qty 6.5

## 2020-08-20 NOTE — Patient Instructions (Signed)
[facility]  Discharge Instructions: Thank you for choosing [facility] to provide your oncology and hematology care.  If you have a lab appointment with the [facility], please go directly to the [facility] and check in at the registration area.  Wear comfortable clothing and clothing appropriate for easy access to any Portacath or PICC line.   We strive to give you quality time with your provider. You may need to reschedule your appointment if you arrive late (15 or more minutes).  Arriving late affects you and other patients whose appointments are after yours.  Also, if you miss three or more appointments without notifying the office, you may be dismissed from the clinic at the provider's discretion.      For prescription refill requests, have your pharmacy contact our office and allow 72 hours for refills to be completed.    Today you received the following chemotherapy and/or immunotherapy agents: Vidaza       To help prevent nausea and vomiting after your treatment, we encourage you to take your nausea medication as directed.  BELOW ARE SYMPTOMS THAT SHOULD BE REPORTED IMMEDIATELY: *FEVER GREATER THAN 100.4 F (38 C) OR HIGHER *CHILLS OR SWEATING *NAUSEA AND VOMITING THAT IS NOT CONTROLLED WITH YOUR NAUSEA MEDICATION *UNUSUAL SHORTNESS OF BREATH *UNUSUAL BRUISING OR BLEEDING *URINARY PROBLEMS (pain or burning when urinating, or frequent urination) *BOWEL PROBLEMS (unusual diarrhea, constipation, pain near the anus) TENDERNESS IN MOUTH AND THROAT WITH OR WITHOUT PRESENCE OF ULCERS (sore throat, sores in mouth, or a toothache) UNUSUAL RASH, SWELLING OR PAIN  UNUSUAL VAGINAL DISCHARGE OR ITCHING   Items with * indicate a potential emergency and should be followed up as soon as possible or go to the Emergency Department if any problems should occur.  Please show the CHEMOTHERAPY ALERT CARD or IMMUNOTHERAPY ALERT CARD at check-in  to the Emergency Department and triage nurse.  Should you have questions after your visit or need to cancel or reschedule your appointment, please contact [facility]  [phone] and follow the prompts.  Office hours are 8:00 a.m. to 4:30 p.m. Monday - Friday. Please note that voicemails left after 4:00 p.m. may not be returned until the following business day.  We are closed weekends and major holidays. You have access to a nurse at all times for urgent questions. Please call the main number to the clinic [phone] and follow the prompts.  For any non-urgent questions, you may also contact your provider using MyChart. We now offer e-Visits for anyone 13 and older to request care online for non-urgent symptoms. For details visit [email or web address].   Also download the MyChart app! Go to the app store, search "MyChart", open the app, select [facility], and log in with your MyChart username and password.  Due to Covid, a mask is required upon entering the hospital/clinic. If you do not have a mask, one will be given to you upon arrival. For doctor visits, patients may have 1 support person aged 74 or older with them. For treatment visits, patients cannot have anyone with them due to current Covid guidelines and our immunocompromised population. Azacitidine suspension for injection (subcutaneous use) What is this medication? AZACITIDINE (ay [place]) is a chemotherapy drug. This medicine reduces the growth of cancer cells and can suppress the immune system. It is used fortreating myelodysplastic syndrome or some types of leukemia. This medicine may be used for other purposes; ask your health  care provider orpharmacist if you have questions. COMMON BRAND NAME(S): Vidaza What should I tell my care team before I take this medication? They need to know if you have any of these conditions: kidney disease liver disease liver tumors an unusual or allergic  reaction to azacitidine, mannitol, other medicines, foods, dyes, or preservatives pregnant or trying to get pregnant breast-feeding How should I use this medication? This medicine is for injection under the skin. It is administered in a hospitalor clinic by a specially trained health care professional. Talk to your pediatrician regarding the use of this medicine in children. Whilethis drug may be prescribed for selected conditions, precautions do apply. Overdosage: If you think you have taken too much of this medicine contact apoison control center or emergency room at once. NOTE: This medicine is only for you. Do not share this medicine with others. What if I miss a dose? It is important not to miss your dose. Call your doctor or health careprofessional if you are unable to keep an appointment. What may interact with this medication? Interactions have not been studied. Give your health care provider a list of all the medicines, herbs, non-prescription drugs, or dietary supplements you use. Also tell them if you smoke, drink alcohol, or use illegal drugs. Some items may interact with yourmedicine. This list may not describe all possible interactions. Give your health care provider a list of all the medicines, herbs, non-prescription drugs, or dietary supplements you use. Also tell them if you smoke, drink alcohol, or use illegaldrugs. Some items may interact with your medicine. What should I watch for while using this medication? Visit your doctor for checks on your progress. This drug may make you feel generally unwell. This is not uncommon, as chemotherapy can affect healthy cells as well as cancer cells. Report any side effects. Continue your course oftreatment even though you feel ill unless your doctor tells you to stop. In some cases, you may be given additional medicines to help with side effects.Follow all directions for their use. Call your doctor or health care professional for advice if  you get a fever, chills or sore throat, or other symptoms of a cold or flu. Do not treat yourself. This drug decreases your body's ability to fight infections. Try toavoid being around people who are sick. This medicine may increase your risk to bruise or bleed. Call your doctor orhealth care professional if you notice any unusual bleeding. You may need blood work done while you are taking this medicine. Do not become pregnant while taking this medicine and for 6 months after the last dose. Women should inform their doctor if they wish to become pregnant or think they might be pregnant. Men should not father a child while taking this medicine and for 3 months after the last dose. There is a potential for serious side effects to an unborn child. Talk to your health care professional or pharmacist for more information. Do not breast-feed an infant while taking thismedicine and for 1 week after the last dose. This medicine may interfere with the ability to have a child. Talk with yourdoctor or health care professional if you are concerned about your fertility. What side effects may I notice from receiving this medication? Side effects that you should report to your doctor or health care professionalas soon as possible: allergic reactions like skin rash, itching or hives, swelling of the face, lips, or tongue low blood counts - this medicine may decrease the number of white blood cells,  red blood cells and platelets. You may be at increased risk for infections and bleeding. signs of infection - fever or chills, cough, sore throat, pain passing urine signs of decreased platelets or bleeding - bruising, pinpoint red spots on the skin, black, tarry stools, blood in the urine signs of decreased red blood cells - unusually weak or tired, fainting spells, lightheadedness signs and symptoms of kidney injury like trouble passing urine or change in the amount of urine signs and symptoms of liver injury like dark  yellow or brown urine; general ill feeling or flu-like symptoms; light-colored stools; loss of appetite; nausea; right upper belly pain; unusually weak or tired; yellowing of the eyes or skin Side effects that usually do not require medical attention (report to yourdoctor or health care professional if they continue or are bothersome): constipation diarrhea nausea, vomiting pain or redness at the injection site unusually weak or tired This list may not describe all possible side effects. Call your doctor for medical advice about side effects. You may report side effects to FDA at1-800-FDA-1088. Where should I keep my medication? This drug is given in a hospital or clinic and will not be stored at home. NOTE: This sheet is a summary. It may not cover all possible information. If you have questions about this medicine, talk to your doctor, pharmacist, orhealth care provider.  2022 Elsevier/Gold Standard (2016-01-20 14:37:51)

## 2020-08-21 ENCOUNTER — Inpatient Hospital Stay: Payer: Medicare HMO

## 2020-08-21 ENCOUNTER — Other Ambulatory Visit: Payer: Self-pay

## 2020-08-21 VITALS — BP 123/67 | HR 60 | Temp 98.1°F

## 2020-08-21 DIAGNOSIS — D696 Thrombocytopenia, unspecified: Secondary | ICD-10-CM | POA: Diagnosis not present

## 2020-08-21 DIAGNOSIS — D469 Myelodysplastic syndrome, unspecified: Secondary | ICD-10-CM | POA: Diagnosis not present

## 2020-08-21 DIAGNOSIS — Z5111 Encounter for antineoplastic chemotherapy: Secondary | ICD-10-CM | POA: Diagnosis not present

## 2020-08-21 MED ORDER — AZACITIDINE CHEMO SQ INJECTION
75.0000 mg/m2 | Freq: Once | INTRAMUSCULAR | Status: AC
  Administered 2020-08-21: 162.5 mg via SUBCUTANEOUS
  Filled 2020-08-21: qty 6.5

## 2020-08-21 MED ORDER — ONDANSETRON HCL 4 MG PO TABS
8.0000 mg | ORAL_TABLET | Freq: Once | ORAL | Status: AC
  Administered 2020-08-21: 8 mg via ORAL
  Filled 2020-08-21: qty 2

## 2020-08-21 NOTE — Patient Instructions (Signed)
[facility]  Discharge Instructions: Thank you for choosing [facility] to provide your oncology and hematology care.  If you have a lab appointment with the [facility], please go directly to the [facility] and check in at the registration area.  Wear comfortable clothing and clothing appropriate for easy access to any Portacath or PICC line.   We strive to give you quality time with your provider. You may need to reschedule your appointment if you arrive late (15 or more minutes).  Arriving late affects you and other patients whose appointments are after yours.  Also, if you miss three or more appointments without notifying the office, you may be dismissed from the clinic at the provider's discretion.      For prescription refill requests, have your pharmacy contact our office and allow 72 hours for refills to be completed.    Today you received the following chemotherapy and/or immunotherapy agents : Vidaza    To help prevent nausea and vomiting after your treatment, we encourage you to take your nausea medication as directed.  BELOW ARE SYMPTOMS THAT SHOULD BE REPORTED IMMEDIATELY: *FEVER GREATER THAN 100.4 F (38 C) OR HIGHER *CHILLS OR SWEATING *NAUSEA AND VOMITING THAT IS NOT CONTROLLED WITH YOUR NAUSEA MEDICATION *UNUSUAL SHORTNESS OF BREATH *UNUSUAL BRUISING OR BLEEDING *URINARY PROBLEMS (pain or burning when urinating, or frequent urination) *BOWEL PROBLEMS (unusual diarrhea, constipation, pain near the anus) TENDERNESS IN MOUTH AND THROAT WITH OR WITHOUT PRESENCE OF ULCERS (sore throat, sores in mouth, or a toothache) UNUSUAL RASH, SWELLING OR PAIN  UNUSUAL VAGINAL DISCHARGE OR ITCHING   Items with * indicate a potential emergency and should be followed up as soon as possible or go to the Emergency Department if any problems should occur.  Please show the CHEMOTHERAPY ALERT CARD or IMMUNOTHERAPY ALERT CARD at check-in to  the Emergency Department and triage nurse.  Should you have questions after your visit or need to cancel or reschedule your appointment, please contact [facility]  [phone] and follow the prompts.  Office hours are 8:00 a.m. to 4:30 p.m. Monday - Friday. Please note that voicemails left after 4:00 p.m. may not be returned until the following business day.  We are closed weekends and major holidays. You have access to a nurse at all times for urgent questions. Please call the main number to the clinic [phone] and follow the prompts.  For any non-urgent questions, you may also contact your provider using MyChart. We now offer e-Visits for anyone 90 and older to request care online for non-urgent symptoms. For details visit [email or web address].   Also download the MyChart app! Go to the app store, search "MyChart", open the app, select [facility], and log in with your MyChart username and password.  Due to Covid, a mask is required upon entering the hospital/clinic. If you do not have a mask, one will be given to you upon arrival. For doctor visits, patients may have 1 support person aged 68 or older with them. For treatment visits, patients cannot have anyone with them due to current Covid guidelines and our immunocompromised population.

## 2020-08-22 ENCOUNTER — Inpatient Hospital Stay: Payer: Medicare HMO

## 2020-08-22 ENCOUNTER — Telehealth: Payer: Self-pay | Admitting: *Deleted

## 2020-08-22 VITALS — BP 131/63 | HR 65 | Temp 96.8°F | Resp 18

## 2020-08-22 DIAGNOSIS — D469 Myelodysplastic syndrome, unspecified: Secondary | ICD-10-CM | POA: Diagnosis not present

## 2020-08-22 DIAGNOSIS — Z5111 Encounter for antineoplastic chemotherapy: Secondary | ICD-10-CM | POA: Diagnosis not present

## 2020-08-22 DIAGNOSIS — D696 Thrombocytopenia, unspecified: Secondary | ICD-10-CM | POA: Diagnosis not present

## 2020-08-22 MED ORDER — AZACITIDINE CHEMO SQ INJECTION
75.0000 mg/m2 | Freq: Once | INTRAMUSCULAR | Status: AC
  Administered 2020-08-22: 162.5 mg via SUBCUTANEOUS
  Filled 2020-08-22: qty 6.5

## 2020-08-22 MED ORDER — ONDANSETRON HCL 4 MG PO TABS
8.0000 mg | ORAL_TABLET | Freq: Once | ORAL | Status: AC
  Administered 2020-08-22: 8 mg via ORAL
  Filled 2020-08-22: qty 2

## 2020-08-22 NOTE — Telephone Encounter (Signed)
Would like to discuss recent visit with this patient.

## 2020-08-22 NOTE — Patient Instructions (Signed)
[facility]  Discharge Instructions: Thank you for choosing [facility] to provide your oncology and hematology care.  If you have a lab appointment with the [facility], please go directly to the [facility] and check in at the registration area.  Wear comfortable clothing and clothing appropriate for easy access to any Portacath or PICC line.   We strive to give you quality time with your provider. You may need to reschedule your appointment if you arrive late (15 or more minutes).  Arriving late affects you and other patients whose appointments are after yours.  Also, if you miss three or more appointments without notifying the office, you may be dismissed from the clinic at the provider's discretion.      For prescription refill requests, have your pharmacy contact our office and allow 72 hours for refills to be completed.    Today you received the following chemotherapy and/or immunotherapy agents Vidaza       To help prevent nausea and vomiting after your treatment, we encourage you to take your nausea medication as directed.  BELOW ARE SYMPTOMS THAT SHOULD BE REPORTED IMMEDIATELY: *FEVER GREATER THAN 100.4 F (38 C) OR HIGHER *CHILLS OR SWEATING *NAUSEA AND VOMITING THAT IS NOT CONTROLLED WITH YOUR NAUSEA MEDICATION *UNUSUAL SHORTNESS OF BREATH *UNUSUAL BRUISING OR BLEEDING *URINARY PROBLEMS (pain or burning when urinating, or frequent urination) *BOWEL PROBLEMS (unusual diarrhea, constipation, pain near the anus) TENDERNESS IN MOUTH AND THROAT WITH OR WITHOUT PRESENCE OF ULCERS (sore throat, sores in mouth, or a toothache) UNUSUAL RASH, SWELLING OR PAIN  UNUSUAL VAGINAL DISCHARGE OR ITCHING   Items with * indicate a potential emergency and should be followed up as soon as possible or go to the Emergency Department if any problems should occur.  Please show the CHEMOTHERAPY ALERT CARD or IMMUNOTHERAPY ALERT CARD at check-in to  the Emergency Department and triage nurse.  Should you have questions after your visit or need to cancel or reschedule your appointment, please contact [facility]  [phone] and follow the prompts.  Office hours are 8:00 a.m. to 4:30 p.m. Monday - Friday. Please note that voicemails left after 4:00 p.m. may not be returned until the following business day.  We are closed weekends and major holidays. You have access to a nurse at all times for urgent questions. Please call the main number to the clinic [phone] and follow the prompts.  For any non-urgent questions, you may also contact your provider using MyChart. We now offer e-Visits for anyone 74 and older to request care online for non-urgent symptoms. For details visit [email or web address].   Also download the MyChart app! Go to the app store, search "MyChart", open the app, select [facility], and log in with your MyChart username and password.  Due to Covid, a mask is required upon entering the hospital/clinic. If you do not have a mask, one will be given to you upon arrival. For doctor visits, patients may have 1 support person aged 74 or older with them. For treatment visits, patients cannot have anyone with them due to current Covid guidelines and our immunocompromised population.

## 2020-08-25 NOTE — Telephone Encounter (Signed)
Its done. I called back

## 2020-08-25 NOTE — Telephone Encounter (Signed)
Nurse Navigator for Dr. Marvel Plan called  to discuss medication for this patient. Azacitadine. [phone].

## 2020-08-28 ENCOUNTER — Encounter: Payer: Self-pay | Admitting: Oncology

## 2020-08-28 ENCOUNTER — Other Ambulatory Visit: Payer: Self-pay

## 2020-08-28 ENCOUNTER — Inpatient Hospital Stay: Payer: Medicare HMO

## 2020-08-28 DIAGNOSIS — D469 Myelodysplastic syndrome, unspecified: Secondary | ICD-10-CM | POA: Diagnosis not present

## 2020-08-28 DIAGNOSIS — Z5111 Encounter for antineoplastic chemotherapy: Secondary | ICD-10-CM | POA: Diagnosis not present

## 2020-08-28 DIAGNOSIS — D696 Thrombocytopenia, unspecified: Secondary | ICD-10-CM

## 2020-08-28 LAB — CBC WITH DIFFERENTIAL/PLATELET
Abs Immature Granulocytes: 0.02 10*3/uL (ref 0.00–0.07)
Basophils Absolute: 0 10*3/uL (ref 0.0–0.1)
Basophils Relative: 1 %
Eosinophils Absolute: 0.3 10*3/uL (ref 0.0–0.5)
Eosinophils Relative: 7 %
HCT: 38.9 % — ABNORMAL LOW (ref 39.0–52.0)
Hemoglobin: 13.5 g/dL (ref 13.0–17.0)
Immature Granulocytes: 1 %
Lymphocytes Relative: 36 %
Lymphs Abs: 1.4 10*3/uL (ref 0.7–4.0)
MCH: 35.8 pg — ABNORMAL HIGH (ref 26.0–34.0)
MCHC: 34.7 g/dL (ref 30.0–36.0)
MCV: 103.2 fL — ABNORMAL HIGH (ref 80.0–100.0)
Monocytes Absolute: 0.2 10*3/uL (ref 0.1–1.0)
Monocytes Relative: 5 %
Neutro Abs: 2 10*3/uL (ref 1.7–7.7)
Neutrophils Relative %: 50 %
Platelets: 56 10*3/uL — ABNORMAL LOW (ref 150–400)
RBC: 3.77 MIL/uL — ABNORMAL LOW (ref 4.22–5.81)
RDW: 14.4 % (ref 11.5–15.5)
WBC: 4 10*3/uL (ref 4.0–10.5)
nRBC: 0 % (ref 0.0–0.2)

## 2020-08-28 LAB — SAMPLE TO BLOOD BANK

## 2020-08-29 ENCOUNTER — Telehealth: Payer: Self-pay | Admitting: *Deleted

## 2020-08-29 ENCOUNTER — Inpatient Hospital Stay: Payer: Medicare HMO

## 2020-08-29 NOTE — Telephone Encounter (Signed)
Called pt today and said that his plt56 and he does not need transfusion for plt today. No concerns.

## 2020-09-02 ENCOUNTER — Other Ambulatory Visit: Payer: Self-pay | Admitting: Oncology

## 2020-09-09 ENCOUNTER — Other Ambulatory Visit: Payer: Self-pay | Admitting: Oncology

## 2020-09-09 DIAGNOSIS — I2510 Atherosclerotic heart disease of native coronary artery without angina pectoris: Secondary | ICD-10-CM | POA: Diagnosis not present

## 2020-09-09 DIAGNOSIS — E138 Other specified diabetes mellitus with unspecified complications: Secondary | ICD-10-CM | POA: Diagnosis not present

## 2020-09-09 DIAGNOSIS — E785 Hyperlipidemia, unspecified: Secondary | ICD-10-CM | POA: Diagnosis not present

## 2020-09-10 DIAGNOSIS — M7541 Impingement syndrome of right shoulder: Secondary | ICD-10-CM | POA: Diagnosis not present

## 2020-09-10 DIAGNOSIS — M545 Low back pain, unspecified: Secondary | ICD-10-CM | POA: Diagnosis not present

## 2020-09-10 DIAGNOSIS — M7501 Adhesive capsulitis of right shoulder: Secondary | ICD-10-CM | POA: Diagnosis not present

## 2020-09-11 ENCOUNTER — Telehealth: Payer: Self-pay | Admitting: *Deleted

## 2020-09-11 NOTE — Telephone Encounter (Signed)
Call returned to patient and informed OK to proceed with injection per Dr Janese Banks

## 2020-09-11 NOTE — Telephone Encounter (Signed)
Patient called stating that he was seen by ortho yesterday for his shoulder and was told to ask Dr Janese Banks if he can get a steroid injection into his shoulder he is on chemotherapy. Please advise

## 2020-09-11 NOTE — Telephone Encounter (Signed)
yes

## 2020-09-12 ENCOUNTER — Other Ambulatory Visit: Payer: Self-pay

## 2020-09-12 NOTE — Progress Notes (Signed)
error 

## 2020-09-13 ENCOUNTER — Other Ambulatory Visit: Payer: Self-pay | Admitting: *Deleted

## 2020-09-13 DIAGNOSIS — D469 Myelodysplastic syndrome, unspecified: Secondary | ICD-10-CM

## 2020-09-13 NOTE — Progress Notes (Signed)
Labs entered.

## 2020-09-15 ENCOUNTER — Inpatient Hospital Stay: Payer: Medicare HMO | Attending: Oncology

## 2020-09-15 ENCOUNTER — Inpatient Hospital Stay: Payer: Medicare HMO | Admitting: Oncology

## 2020-09-15 ENCOUNTER — Inpatient Hospital Stay: Payer: Medicare HMO

## 2020-09-15 ENCOUNTER — Encounter: Payer: Self-pay | Admitting: Oncology

## 2020-09-15 VITALS — BP 122/66 | HR 94 | Temp 97.6°F | Resp 18 | Wt 194.1 lb

## 2020-09-15 DIAGNOSIS — D469 Myelodysplastic syndrome, unspecified: Secondary | ICD-10-CM | POA: Diagnosis not present

## 2020-09-15 DIAGNOSIS — Z5111 Encounter for antineoplastic chemotherapy: Secondary | ICD-10-CM | POA: Insufficient documentation

## 2020-09-15 DIAGNOSIS — D46C Myelodysplastic syndrome with isolated del(5q) chromosomal abnormality: Secondary | ICD-10-CM | POA: Insufficient documentation

## 2020-09-15 DIAGNOSIS — T451X5A Adverse effect of antineoplastic and immunosuppressive drugs, initial encounter: Secondary | ICD-10-CM | POA: Diagnosis not present

## 2020-09-15 DIAGNOSIS — D61810 Antineoplastic chemotherapy induced pancytopenia: Secondary | ICD-10-CM | POA: Diagnosis not present

## 2020-09-15 DIAGNOSIS — D701 Agranulocytosis secondary to cancer chemotherapy: Secondary | ICD-10-CM | POA: Diagnosis not present

## 2020-09-15 LAB — CBC WITH DIFFERENTIAL/PLATELET
Abs Immature Granulocytes: 0 10*3/uL (ref 0.00–0.07)
Basophils Absolute: 0.1 10*3/uL (ref 0.0–0.1)
Basophils Relative: 4 %
Eosinophils Absolute: 0 10*3/uL (ref 0.0–0.5)
Eosinophils Relative: 1 %
HCT: 41.8 % (ref 39.0–52.0)
Hemoglobin: 14.7 g/dL (ref 13.0–17.0)
Immature Granulocytes: 0 %
Lymphocytes Relative: 57 %
Lymphs Abs: 1.1 10*3/uL (ref 0.7–4.0)
MCH: 35.8 pg — ABNORMAL HIGH (ref 26.0–34.0)
MCHC: 35.2 g/dL (ref 30.0–36.0)
MCV: 101.7 fL — ABNORMAL HIGH (ref 80.0–100.0)
Monocytes Absolute: 0.2 10*3/uL (ref 0.1–1.0)
Monocytes Relative: 8 %
Neutro Abs: 0.6 10*3/uL — ABNORMAL LOW (ref 1.7–7.7)
Neutrophils Relative %: 30 %
Platelets: 94 10*3/uL — ABNORMAL LOW (ref 150–400)
RBC: 4.11 MIL/uL — ABNORMAL LOW (ref 4.22–5.81)
RDW: 14 % (ref 11.5–15.5)
WBC: 1.9 10*3/uL — ABNORMAL LOW (ref 4.0–10.5)
nRBC: 0 % (ref 0.0–0.2)

## 2020-09-15 LAB — BASIC METABOLIC PANEL
Anion gap: 7 (ref 5–15)
BUN: 14 mg/dL (ref 8–23)
CO2: 28 mmol/L (ref 22–32)
Calcium: 9.2 mg/dL (ref 8.9–10.3)
Chloride: 105 mmol/L (ref 98–111)
Creatinine, Ser: 1 mg/dL (ref 0.61–1.24)
GFR, Estimated: >60 mL/min (ref >60)
Glucose, Bld: 196 mg/dL — ABNORMAL HIGH (ref 70–99)
Potassium: 3.7 mmol/L (ref 3.5–5.1)
Sodium: 140 mmol/L (ref 135–145)

## 2020-09-15 NOTE — Progress Notes (Signed)
Hematology/Oncology Consult note [facility]  [phone] [phone]  Patient Care Team: Danelle Berry, NP as PCP - General (Nurse Practitioner) Dionisio David, MD (Cardiology) Sindy Guadeloupe, MD as Consulting Physician (Hematology and Oncology)   Name of the patient: Timothy Murray  [id number]  08/08/1945   Date of visit: 09/15/20  Diagnosis- MDS with 5q del but high risk given TP53 mutation 2. [facility] s/p thermal ablation  Chief complaint/ Reason for visit-on treatment assessment prior to cycle 5-day 1 of Vidaza  Heme/Onc history: Patient is a 75 year old male with a history of cirrhosis and hepatocellular carcinoma for which she had proton beam radiation In June 2021.  He has baseline thrombocytopenia which was initially attributed to his cirrhosis.  He was found to have progressive pancytopenia with a steady decline in his white cell count since March 2022 when it was down to 3.4 with a platelet count of 53.  He was hospitalized sometime in April 2022 with acute biliary gallstone pancreatitis .  He underwent ERCP inpatient with platelet transfusion which showed sludge and biliary sphincterotomy was performed.  His bilirubin which was elevated up to 7.2 has come down to 2.4 presently.   However his pancytopenia has continued to worsen and presently white count is 1.1 with an H&H of 6.2/17.6 with an MCV of 102 and a platelet count of 33.Bone marrow biopsy showed evidence of dyspoietic changes in the erythroid and megakaryocyte excel lines associated with ring sideroblasts.  Definite increase in blastic cells not identified.  Overall features favor myelodysplastic syndrome.  Karyotype was normal but only for metaphase cells were analyzed which were not sufficient.  [facility] FISH MDS panel showed deletion of 5 q. along with monosomy 7 and deletion 7 q. without excess blasts.  SF3B1 mutation negative.   Peripheral blood intellegin myeloid  panel showed :   Detected     Change       Frequency (%) Impact  TP53   c.716A>C     p.Asn239Thr  19            Tier I  U2AF1  c.470A>C     p.Gln157Pro  17            Tier I  IDH1   c.315C>T     p.Gly105Gly  13            Tier II      With regards to his hepatocellular carcinoma patient had an MRI abdomen on 04/24/2020 which showed that his primary liver mass which was previously 4.3 x 3.6 cmWas similar in size at 4.5 x 3.5 cm.    Patient was briefly on Revlimid for 5 q. deletion but after T p53 came back he was switched to [place] for 5 days every 28 days starting 05/27/2020    Patient was seen by Dr. Marvel Plan at [facility] for second opinion and he also had a repeat bone marrow biopsyDone on 08/14/2020.  Biopsy showed normocellular bone marrow with erythroid predominant trilineage hematopoiesis.  Megakaryocytes were typical in number with few hypolobated forms.  Full sequence of orderly maturation noted in both granulopoiesis and erythropoiesis.  3% blasts were seen by manual differential.   Plan is to continue Vidaza until progression or toxicity  Interval history-overall patient is tolerating treatment well so far.  Energy levels are gradually improving.  Denies any fevers chills.  He is still on acyclovir and Levaquin prophylaxis.  ECOG PS- 1 Pain scale- 0  Review of systems- Review of Systems  Constitutional:  Positive for malaise/fatigue. Negative for chills, fever and weight loss.  HENT:  Negative for congestion, ear discharge and nosebleeds.   Eyes:  Negative for blurred vision.  Respiratory:  Negative for cough, hemoptysis, sputum production, shortness of breath and wheezing.   Cardiovascular:  Negative for chest pain, palpitations, orthopnea and claudication.  Gastrointestinal:  Negative for abdominal pain, blood in stool, constipation, diarrhea, heartburn, melena, nausea and vomiting.  Genitourinary:  Negative for dysuria, flank pain, frequency, hematuria and urgency.   Musculoskeletal:  Negative for back pain, joint pain and myalgias.  Skin:  Negative for rash.  Neurological:  Negative for dizziness, tingling, focal weakness, seizures, weakness and headaches.  Endo/Heme/Allergies:  Does not bruise/bleed easily.  Psychiatric/Behavioral:  Negative for depression and suicidal ideas. The patient does not have insomnia.      Allergies  Allergen Reactions   Dome-Paste Bandage [Wound Dressings]     All bandaids cause a rash   Soap & Cleansers     "liquid soap" antibacterial -rash   Latex Rash and Itching     Past Medical History:  Diagnosis Date   Arthritis    "lower back" (10/24/2012)   Coronary artery disease    GERD (gastroesophageal reflux disease)    High cholesterol    Hypertension    Liver cancer ([place])    MDS (myelodysplastic syndrome) ([place])    Type II diabetes mellitus ([place])      Past Surgical History:  Procedure Laterality Date   APPENDECTOMY  2002   CARDIAC CATHETERIZATION  10/24/2012   CORONARY ARTERY BYPASS GRAFT N/A 10/26/2012   Procedure: CORONARY ARTERY BYPASS GRAFTING (CABG);  Surgeon: Melrose Nakayama, MD;  Location: [facility];  Service: Open Heart Surgery;  Laterality: N/A;  Times 3 using left internal mammary artery and endoscopically harvested right saphenous vein   ERCP N/A 04/25/2020   Procedure: ENDOSCOPIC RETROGRADE CHOLANGIOPANCREATOGRAPHY (ERCP);  Surgeon: Lucilla Lame, MD;  Location: [facility] ENDOSCOPY;  Service: Endoscopy;  Laterality: N/A;    Social History   Socioeconomic History   Marital status: Married    Spouse name: Not on file   Number of children: Not on file   Years of education: Not on file   Highest education level: Not on file  Occupational History   Not on file  Tobacco Use   Smoking status: Former    Packs/day: 0.50    Years: 30.00    Pack years: 15.00    Types: Cigarettes    Quit date: 08/28/2001    Years since quitting: 19.0   Smokeless tobacco: Never  Vaping Use   Vaping Use: Never used   Substance and Sexual Activity   Alcohol use: No    Alcohol/week: 0.0 standard drinks    Comment: 10/24/2012 "stopped drinking alcohol in 2003; used to drink ~ 9 bottles beer/wk"   Drug use: No   Sexual activity: Yes  Other Topics Concern   Not on file  Social History Narrative   Not on file   Social Determinants of Health   Financial Resource Strain: Not on file  Food Insecurity: Not on file  Transportation Needs: Not on file  Physical Activity: Not on file  Stress: Not on file  Social Connections: Not on file  Intimate Partner Violence: Not on file    Family History  Problem Relation Age of Onset   Cancer Sister      Current Outpatient Medications:    acyclovir (ZOVIRAX) 400 MG  tablet, Take by mouth., Disp: , Rfl:    atorvastatin (LIPITOR) 40 MG tablet, Take by mouth., Disp: , Rfl:    glimepiride (AMARYL) 1 MG tablet, Take 1 mg by mouth daily., Disp: , Rfl:    levofloxacin (LEVAQUIN) 500 MG tablet, Take 1 tablet (500 mg total) by mouth daily., Disp: 30 tablet, Rfl: 0   loratadine (CLARITIN) 10 MG tablet, Take 5 mg by mouth daily as needed. Take half a tablet (44m) daily, Disp: , Rfl:    metoprolol tartrate (LOPRESSOR) 25 MG tablet, Take 25 mg by mouth daily., Disp: , Rfl:    oxyCODONE (OXY IR/ROXICODONE) 5 MG immediate release tablet, Take 1 tablet (5 mg total) by mouth every 8 (eight) hours as needed for severe pain., Disp: 90 tablet, Rfl: 0   senna-docusate (SENOKOT-S) 8.6-50 MG tablet, Take 1 tablet by mouth every morning., Disp: , Rfl:    traZODone (DESYREL) 50 MG tablet, TAKE 1-2 TABLETS BY MOUTH AT BEDTIME., Disp: 180 tablet, Rfl: 1   gabapentin (NEURONTIN) 300 MG capsule, Take 1 capsule (300 mg total) by mouth at bedtime. (Patient not taking: Reported on 09/15/2020), Disp: 30 capsule, Rfl: 0   LORazepam (ATIVAN) 0.5 MG tablet, Take 1 tablet (0.5 mg total) by mouth every 6 (six) hours as needed (Nausea or vomiting). (Patient not taking: No sig reported), Disp: 30  tablet, Rfl: 0   losartan (COZAAR) 25 MG tablet, Take 25 mg by mouth daily. (Patient not taking: Reported on 09/15/2020), Disp: , Rfl:    nitroGLYCERIN (NITROSTAT) 0.3 MG SL tablet, nitroglycerin 0.3 mg sublingual tablet  PLACE ONE TABLET (0.3 MG DOSE) UNDER THE TONGUE EVERY 5 (FIVE) MINUTES AS NEEDED FOR CHEST PAIN. (Patient not taking: No sig reported), Disp: , Rfl:    omeprazole (PRILOSEC) 20 MG capsule, Take 40 mg by mouth every morning. (Patient not taking: Reported on 09/15/2020), Disp: , Rfl:    omeprazole (PRILOSEC) 40 MG capsule, TAKE 1 CAPSULE BY MOUTH TWICE A DAY (Patient not taking: Reported on 09/15/2020), Disp: 60 capsule, Rfl: 0   ondansetron (ZOFRAN) 8 MG tablet, Take 1 tablet (8 mg total) by mouth 2 (two) times daily as needed (Nausea or vomiting). (Patient not taking: No sig reported), Disp: 30 tablet, Rfl: 1   predniSONE (DELTASONE) 20 MG tablet, TAKE 1 TABLET BY MOUTH DAILY X 5 DAYS IN AM WITH FOOD (Patient not taking: Reported on 09/15/2020), Disp: , Rfl:    prochlorperazine (COMPAZINE) 10 MG tablet, Take 1 tablet (10 mg total) by mouth every 6 (six) hours as needed (Nausea or vomiting). (Patient not taking: Reported on 09/15/2020), Disp: 30 tablet, Rfl: 1  Physical exam:  Vitals:   09/15/20 0917  BP: 122/66  Pulse: 94  Resp: 18  Temp: 97.6 F (36.4 C)  SpO2: 99%  Weight: 194 lb 1.6 oz (88 kg)   Physical Exam Constitutional:      General: He is not in acute distress. HENT:     Mouth/Throat:     Mouth: Mucous membranes are moist.     Pharynx: Oropharynx is clear.  Eyes:     Pupils: Pupils are equal, round, and reactive to light.  Cardiovascular:     Rate and Rhythm: Normal rate and regular rhythm.     Heart sounds: Normal heart sounds.  Pulmonary:     Effort: Pulmonary effort is normal.     Breath sounds: Normal breath sounds.  Abdominal:     General: Bowel sounds are normal.     Palpations:  Abdomen is soft.  Skin:    General: Skin is warm and dry.   Neurological:     Mental Status: He is alert and oriented to person, place, and time.     CMP Latest Ref Rng & Units 09/15/2020  Glucose 70 - 99 mg/dL 196(H)  BUN 8 - 23 mg/dL 14  Creatinine 0.61 - 1.24 mg/dL 1.00  Sodium 135 - 145 mmol/L 140  Potassium 3.5 - 5.1 mmol/L 3.7  Chloride 98 - 111 mmol/L 105  CO2 22 - 32 mmol/L 28  Calcium 8.9 - 10.3 mg/dL 9.2  Total Protein 6.5 - 8.1 g/dL -  Total Bilirubin 0.3 - 1.2 mg/dL -  Alkaline Phos 38 - 126 U/L -  AST 15 - 41 U/L -  ALT 0 - 44 U/L -   CBC Latest Ref Rng & Units 09/15/2020  WBC 4.0 - 10.5 K/uL 1.9(L)  Hemoglobin 13.0 - 17.0 g/dL 14.7  Hematocrit 39.0 - 52.0 % 41.8  Platelets 150 - 400 K/uL 94(L)    Assessment and plan- Patient is a 75 y.o. male with high risk MDS and T p53 mutation.  He is here for on treatment assessment prior to cycle 5-day 1 of Vidaza  White count is down to 1.9 today with an ANC of 0.6.  I will therefore hold off on giving him Vidaza this week.  He will return to clinic in 1 week and at that point I will reduce the dose of Vidaza To 50 mg per metered squared given his progressive neutropenia.  I do think the neutropenia is more treatment-related drop than MDS given that his hemoglobin is now improved to 14.7 and platelets have also improved to 94.  Recent bone marrow biopsy also showed normocellular bone marrow with erythroid predominant trilineage hematopoiesis.  Orderly maturation of both granulopoiesis and erythropoiesis was noted.  Plan is to continue Vidaza until progression or toxicity.  No chemo today or later this week.  Return to clinic in 1 week.  CBC with differential and gets Vidaza day 1 to day 5. CBC with differential to be checked in 3 weeks CBC with differential, CMP and see Dr. Janese Banks in 5 weeks.  Gets Vidaza day 1 to day 5  Continue acyclovir and Levaquin prophylaxis   Visit Diagnosis 1. MDS (myelodysplastic syndrome) ([place])   2. Encounter for antineoplastic chemotherapy   3. Chemotherapy  induced neutropenia ([facility])      Dr. Randa Evens, MD, MPH [facility] [phone] 09/15/2020 10:00 AM

## 2020-09-16 ENCOUNTER — Inpatient Hospital Stay: Payer: Medicare HMO

## 2020-09-17 ENCOUNTER — Inpatient Hospital Stay: Payer: Medicare HMO

## 2020-09-18 ENCOUNTER — Inpatient Hospital Stay: Payer: Medicare HMO

## 2020-09-19 ENCOUNTER — Inpatient Hospital Stay: Payer: Medicare HMO

## 2020-09-22 ENCOUNTER — Ambulatory Visit: Payer: Medicare HMO

## 2020-09-22 ENCOUNTER — Inpatient Hospital Stay: Payer: Medicare HMO

## 2020-09-22 ENCOUNTER — Other Ambulatory Visit: Payer: Medicare HMO

## 2020-09-22 VITALS — BP 129/71 | HR 60 | Temp 98.0°F | Resp 19 | Wt 194.4 lb

## 2020-09-22 DIAGNOSIS — Z5111 Encounter for antineoplastic chemotherapy: Secondary | ICD-10-CM | POA: Diagnosis not present

## 2020-09-22 DIAGNOSIS — D701 Agranulocytosis secondary to cancer chemotherapy: Secondary | ICD-10-CM | POA: Diagnosis not present

## 2020-09-22 DIAGNOSIS — D61810 Antineoplastic chemotherapy induced pancytopenia: Secondary | ICD-10-CM | POA: Diagnosis not present

## 2020-09-22 DIAGNOSIS — D469 Myelodysplastic syndrome, unspecified: Secondary | ICD-10-CM

## 2020-09-22 DIAGNOSIS — D46C Myelodysplastic syndrome with isolated del(5q) chromosomal abnormality: Secondary | ICD-10-CM | POA: Diagnosis not present

## 2020-09-22 LAB — CBC WITH DIFFERENTIAL/PLATELET
Abs Immature Granulocytes: 0.02 10*3/uL (ref 0.00–0.07)
Basophils Absolute: 0 10*3/uL (ref 0.0–0.1)
Basophils Relative: 1 %
Eosinophils Absolute: 0 10*3/uL (ref 0.0–0.5)
Eosinophils Relative: 2 %
HCT: 41.5 % (ref 39.0–52.0)
Hemoglobin: 14.7 g/dL (ref 13.0–17.0)
Immature Granulocytes: 1 %
Lymphocytes Relative: 50 %
Lymphs Abs: 1.1 10*3/uL (ref 0.7–4.0)
MCH: 35.4 pg — ABNORMAL HIGH (ref 26.0–34.0)
MCHC: 35.4 g/dL (ref 30.0–36.0)
MCV: 100 fL (ref 80.0–100.0)
Monocytes Absolute: 0.2 10*3/uL (ref 0.1–1.0)
Monocytes Relative: 10 %
Neutro Abs: 0.8 10*3/uL — ABNORMAL LOW (ref 1.7–7.7)
Neutrophils Relative %: 36 %
Platelets: 69 10*3/uL — ABNORMAL LOW (ref 150–400)
RBC: 4.15 MIL/uL — ABNORMAL LOW (ref 4.22–5.81)
RDW: 13.2 % (ref 11.5–15.5)
WBC: 2.1 10*3/uL — ABNORMAL LOW (ref 4.0–10.5)
nRBC: 0 % (ref 0.0–0.2)

## 2020-09-22 LAB — BASIC METABOLIC PANEL
Anion gap: 8 (ref 5–15)
BUN: 14 mg/dL (ref 8–23)
CO2: 28 mmol/L (ref 22–32)
Calcium: 9.2 mg/dL (ref 8.9–10.3)
Chloride: 102 mmol/L (ref 98–111)
Creatinine, Ser: 1.01 mg/dL (ref 0.61–1.24)
GFR, Estimated: >60 mL/min (ref >60)
Glucose, Bld: 225 mg/dL — ABNORMAL HIGH (ref 70–99)
Potassium: 4 mmol/L (ref 3.5–5.1)
Sodium: 138 mmol/L (ref 135–145)

## 2020-09-22 MED ORDER — AZACITIDINE CHEMO SQ INJECTION
75.0000 mg/m2 | Freq: Once | INTRAMUSCULAR | Status: AC
  Administered 2020-09-22: 162.5 mg via SUBCUTANEOUS
  Filled 2020-09-22: qty 6.5

## 2020-09-22 MED ORDER — ONDANSETRON HCL 4 MG PO TABS
8.0000 mg | ORAL_TABLET | Freq: Once | ORAL | Status: AC
  Administered 2020-09-22: 8 mg via ORAL
  Filled 2020-09-22: qty 2

## 2020-09-22 NOTE — Progress Notes (Signed)
Labs reviewed with MD and treatment team. Per MD to continue with treatment today. Treatment team updated and all questions answered at this time.   Mandeep Kiser [employer]

## 2020-09-22 NOTE — Patient Instructions (Signed)
[facility] ONCOLOGY  Discharge Instructions: Thank you for choosing [facility] to provide your oncology and hematology care.  If you have a lab appointment with the [facility], please go directly to the [facility] and check in at the registration area.  Wear comfortable clothing and clothing appropriate for easy access to any Portacath or PICC line.   We strive to give you quality time with your provider. You may need to reschedule your appointment if you arrive late (15 or more minutes).  Arriving late affects you and other patients whose appointments are after yours.  Also, if you miss three or more appointments without notifying the office, you may be dismissed from the clinic at the provider's discretion.      For prescription refill requests, have your pharmacy contact our office and allow 72 hours for refills to be completed.    Today you received the following chemotherapy and/or immunotherapy agents Vidaza    To help prevent nausea and vomiting after your treatment, we encourage you to take your nausea medication as directed.  BELOW ARE SYMPTOMS THAT SHOULD BE REPORTED IMMEDIATELY: *FEVER GREATER THAN 100.4 F (38 C) OR HIGHER *CHILLS OR SWEATING *NAUSEA AND VOMITING THAT IS NOT CONTROLLED WITH YOUR NAUSEA MEDICATION *UNUSUAL SHORTNESS OF BREATH *UNUSUAL BRUISING OR BLEEDING *URINARY PROBLEMS (pain or burning when urinating, or frequent urination) *BOWEL PROBLEMS (unusual diarrhea, constipation, pain near the anus) TENDERNESS IN MOUTH AND THROAT WITH OR WITHOUT PRESENCE OF ULCERS (sore throat, sores in mouth, or a toothache) UNUSUAL RASH, SWELLING OR PAIN  UNUSUAL VAGINAL DISCHARGE OR ITCHING   Items with * indicate a potential emergency and should be followed up as soon as possible or go to the Emergency Department if any problems should occur.  Please show the CHEMOTHERAPY ALERT CARD or IMMUNOTHERAPY ALERT CARD at check-in to the  Emergency Department and triage nurse.  Should you have questions after your visit or need to cancel or reschedule your appointment, please contact [facility]  [phone] and follow the prompts.  Office hours are 8:00 a.m. to 4:30 p.m. Monday - Friday. Please note that voicemails left after 4:00 p.m. may not be returned until the following business day.  We are closed weekends and major holidays. You have access to a nurse at all times for urgent questions. Please call the main number to the clinic [phone] and follow the prompts.  For any non-urgent questions, you may also contact your provider using MyChart. We now offer e-Visits for anyone 23 and older to request care online for non-urgent symptoms. For details visit [email or web address].   Also download the MyChart app! Go to the app store, search "MyChart", open the app, select [facility], and log in with your MyChart username and password.  Due to Covid, a mask is required upon entering the hospital/clinic. If you do not have a mask, one will be given to you upon arrival. For doctor visits, patients may have 1 support person aged 50 or older with them. For treatment visits, patients cannot have anyone with them due to current Covid guidelines and our immunocompromised population.

## 2020-09-23 ENCOUNTER — Other Ambulatory Visit: Payer: Self-pay | Admitting: Oncology

## 2020-09-23 ENCOUNTER — Inpatient Hospital Stay: Payer: Medicare HMO

## 2020-09-23 VITALS — BP 124/58 | HR 55 | Temp 97.5°F | Resp 16

## 2020-09-23 DIAGNOSIS — D46C Myelodysplastic syndrome with isolated del(5q) chromosomal abnormality: Secondary | ICD-10-CM | POA: Diagnosis not present

## 2020-09-23 DIAGNOSIS — D469 Myelodysplastic syndrome, unspecified: Secondary | ICD-10-CM

## 2020-09-23 DIAGNOSIS — Z5111 Encounter for antineoplastic chemotherapy: Secondary | ICD-10-CM | POA: Diagnosis not present

## 2020-09-23 DIAGNOSIS — D701 Agranulocytosis secondary to cancer chemotherapy: Secondary | ICD-10-CM | POA: Diagnosis not present

## 2020-09-23 DIAGNOSIS — D61810 Antineoplastic chemotherapy induced pancytopenia: Secondary | ICD-10-CM | POA: Diagnosis not present

## 2020-09-23 MED ORDER — ONDANSETRON HCL 4 MG PO TABS
8.0000 mg | ORAL_TABLET | Freq: Once | ORAL | Status: AC
  Administered 2020-09-23: 8 mg via ORAL
  Filled 2020-09-23: qty 2

## 2020-09-23 MED ORDER — AZACITIDINE CHEMO SQ INJECTION
75.0000 mg/m2 | Freq: Once | INTRAMUSCULAR | Status: AC
  Administered 2020-09-23: 162.5 mg via SUBCUTANEOUS
  Filled 2020-09-23: qty 6.5

## 2020-09-23 NOTE — Progress Notes (Signed)
Confirmed dosing of Vidaza 75mg /m2 with Dr. Janese Banks. "We will give full dose this cycle and if there is continued downward trend in counts, I will lower it for next cycle." Tolerated injections well. Discharged patient to consult with Dr. Janese Banks.

## 2020-09-24 ENCOUNTER — Inpatient Hospital Stay: Payer: Medicare HMO

## 2020-09-24 ENCOUNTER — Other Ambulatory Visit: Payer: Self-pay | Admitting: Oncology

## 2020-09-24 VITALS — BP 135/68 | HR 65 | Temp 96.5°F | Resp 16

## 2020-09-24 DIAGNOSIS — D469 Myelodysplastic syndrome, unspecified: Secondary | ICD-10-CM

## 2020-09-24 DIAGNOSIS — D701 Agranulocytosis secondary to cancer chemotherapy: Secondary | ICD-10-CM | POA: Diagnosis not present

## 2020-09-24 DIAGNOSIS — D46C Myelodysplastic syndrome with isolated del(5q) chromosomal abnormality: Secondary | ICD-10-CM | POA: Diagnosis not present

## 2020-09-24 DIAGNOSIS — D61810 Antineoplastic chemotherapy induced pancytopenia: Secondary | ICD-10-CM | POA: Diagnosis not present

## 2020-09-24 DIAGNOSIS — Z5111 Encounter for antineoplastic chemotherapy: Secondary | ICD-10-CM | POA: Diagnosis not present

## 2020-09-24 MED ORDER — ONDANSETRON HCL 4 MG PO TABS
8.0000 mg | ORAL_TABLET | Freq: Once | ORAL | Status: AC
  Administered 2020-09-24: 8 mg via ORAL
  Filled 2020-09-24: qty 2

## 2020-09-24 MED ORDER — AZACITIDINE CHEMO SQ INJECTION
75.0000 mg/m2 | Freq: Once | INTRAMUSCULAR | Status: AC
  Administered 2020-09-24: 162.5 mg via SUBCUTANEOUS
  Filled 2020-09-24: qty 6.5

## 2020-09-24 NOTE — Patient Instructions (Signed)
[facility] ONCOLOGY  Discharge Instructions: Thank you for choosing [facility] to provide your oncology and hematology care.  If you have a lab appointment with the [facility], please go directly to the [facility] and check in at the registration area.  Wear comfortable clothing and clothing appropriate for easy access to any Portacath or PICC line.   We strive to give you quality time with your provider. You may need to reschedule your appointment if you arrive late (15 or more minutes).  Arriving late affects you and other patients whose appointments are after yours.  Also, if you miss three or more appointments without notifying the office, you may be dismissed from the clinic at the provider's discretion.      For prescription refill requests, have your pharmacy contact our office and allow 72 hours for refills to be completed.    Today you received the following chemotherapy and/or immunotherapy agents : Vidaza   To help prevent nausea and vomiting after your treatment, we encourage you to take your nausea medication as directed.  BELOW ARE SYMPTOMS THAT SHOULD BE REPORTED IMMEDIATELY: *FEVER GREATER THAN 100.4 F (38 C) OR HIGHER *CHILLS OR SWEATING *NAUSEA AND VOMITING THAT IS NOT CONTROLLED WITH YOUR NAUSEA MEDICATION *UNUSUAL SHORTNESS OF BREATH *UNUSUAL BRUISING OR BLEEDING *URINARY PROBLEMS (pain or burning when urinating, or frequent urination) *BOWEL PROBLEMS (unusual diarrhea, constipation, pain near the anus) TENDERNESS IN MOUTH AND THROAT WITH OR WITHOUT PRESENCE OF ULCERS (sore throat, sores in mouth, or a toothache) UNUSUAL RASH, SWELLING OR PAIN  UNUSUAL VAGINAL DISCHARGE OR ITCHING   Items with * indicate a potential emergency and should be followed up as soon as possible or go to the Emergency Department if any problems should occur.  Please show the CHEMOTHERAPY ALERT CARD or IMMUNOTHERAPY ALERT CARD at check-in to  the Emergency Department and triage nurse.  Should you have questions after your visit or need to cancel or reschedule your appointment, please contact [facility]  [phone] and follow the prompts.  Office hours are 8:00 a.m. to 4:30 p.m. Monday - Friday. Please note that voicemails left after 4:00 p.m. may not be returned until the following business day.  We are closed weekends and major holidays. You have access to a nurse at all times for urgent questions. Please call the main number to the clinic [phone] and follow the prompts.  For any non-urgent questions, you may also contact your provider using MyChart. We now offer e-Visits for anyone 85 and older to request care online for non-urgent symptoms. For details visit [email or web address].   Also download the MyChart app! Go to the app store, search "MyChart", open the app, select [facility], and log in with your MyChart username and password.  Due to Covid, a mask is required upon entering the hospital/clinic. If you do not have a mask, one will be given to you upon arrival. For doctor visits, patients may have 1 support person aged 71 or older with them. For treatment visits, patients cannot have anyone with them due to current Covid guidelines and our immunocompromised population.

## 2020-09-25 ENCOUNTER — Inpatient Hospital Stay: Payer: Medicare HMO

## 2020-09-25 VITALS — BP 129/72 | HR 69 | Temp 98.1°F | Resp 17

## 2020-09-25 DIAGNOSIS — D61810 Antineoplastic chemotherapy induced pancytopenia: Secondary | ICD-10-CM | POA: Diagnosis not present

## 2020-09-25 DIAGNOSIS — D46C Myelodysplastic syndrome with isolated del(5q) chromosomal abnormality: Secondary | ICD-10-CM | POA: Diagnosis not present

## 2020-09-25 DIAGNOSIS — Z5111 Encounter for antineoplastic chemotherapy: Secondary | ICD-10-CM | POA: Diagnosis not present

## 2020-09-25 DIAGNOSIS — D701 Agranulocytosis secondary to cancer chemotherapy: Secondary | ICD-10-CM | POA: Diagnosis not present

## 2020-09-25 DIAGNOSIS — D469 Myelodysplastic syndrome, unspecified: Secondary | ICD-10-CM

## 2020-09-25 MED ORDER — ONDANSETRON HCL 4 MG PO TABS
8.0000 mg | ORAL_TABLET | Freq: Once | ORAL | Status: AC
  Administered 2020-09-25: 8 mg via ORAL
  Filled 2020-09-25: qty 2

## 2020-09-25 MED ORDER — AZACITIDINE CHEMO SQ INJECTION
75.0000 mg/m2 | Freq: Once | INTRAMUSCULAR | Status: AC
  Administered 2020-09-25: 162.5 mg via SUBCUTANEOUS
  Filled 2020-09-25: qty 6.5

## 2020-09-25 NOTE — Progress Notes (Signed)
Patient tolerated Vidaza injection well today, no concerns voiced. Patient discharged. Stable.

## 2020-09-26 ENCOUNTER — Inpatient Hospital Stay: Payer: Medicare HMO

## 2020-09-26 VITALS — BP 126/72 | HR 69 | Temp 97.3°F | Resp 18

## 2020-09-26 DIAGNOSIS — D46C Myelodysplastic syndrome with isolated del(5q) chromosomal abnormality: Secondary | ICD-10-CM | POA: Diagnosis not present

## 2020-09-26 DIAGNOSIS — D61810 Antineoplastic chemotherapy induced pancytopenia: Secondary | ICD-10-CM | POA: Diagnosis not present

## 2020-09-26 DIAGNOSIS — Z5111 Encounter for antineoplastic chemotherapy: Secondary | ICD-10-CM | POA: Diagnosis not present

## 2020-09-26 DIAGNOSIS — D701 Agranulocytosis secondary to cancer chemotherapy: Secondary | ICD-10-CM | POA: Diagnosis not present

## 2020-09-26 DIAGNOSIS — D469 Myelodysplastic syndrome, unspecified: Secondary | ICD-10-CM

## 2020-09-26 MED ORDER — ONDANSETRON HCL 4 MG PO TABS
8.0000 mg | ORAL_TABLET | Freq: Once | ORAL | Status: AC
  Administered 2020-09-26: 8 mg via ORAL
  Filled 2020-09-26: qty 2

## 2020-09-26 MED ORDER — AZACITIDINE CHEMO SQ INJECTION
75.0000 mg/m2 | Freq: Once | INTRAMUSCULAR | Status: AC
  Administered 2020-09-26: 162.5 mg via SUBCUTANEOUS
  Filled 2020-09-26: qty 6.5

## 2020-09-26 NOTE — Patient Instructions (Signed)
[facility] ONCOLOGY  Discharge Instructions: Thank you for choosing [facility] to provide your oncology and hematology care.  If you have a lab appointment with the [facility], please go directly to the [facility] and check in at the registration area.  Wear comfortable clothing and clothing appropriate for easy access to any Portacath or PICC line.   We strive to give you quality time with your provider. You may need to reschedule your appointment if you arrive late (15 or more minutes).  Arriving late affects you and other patients whose appointments are after yours.  Also, if you miss three or more appointments without notifying the office, you may be dismissed from the clinic at the provider's discretion.      For prescription refill requests, have your pharmacy contact our office and allow 72 hours for refills to be completed.    Today you received the following chemotherapy and/or immunotherapy agents: Vidaza       To help prevent nausea and vomiting after your treatment, we encourage you to take your nausea medication as directed.  BELOW ARE SYMPTOMS THAT SHOULD BE REPORTED IMMEDIATELY: *FEVER GREATER THAN 100.4 F (38 C) OR HIGHER *CHILLS OR SWEATING *NAUSEA AND VOMITING THAT IS NOT CONTROLLED WITH YOUR NAUSEA MEDICATION *UNUSUAL SHORTNESS OF BREATH *UNUSUAL BRUISING OR BLEEDING *URINARY PROBLEMS (pain or burning when urinating, or frequent urination) *BOWEL PROBLEMS (unusual diarrhea, constipation, pain near the anus) TENDERNESS IN MOUTH AND THROAT WITH OR WITHOUT PRESENCE OF ULCERS (sore throat, sores in mouth, or a toothache) UNUSUAL RASH, SWELLING OR PAIN  UNUSUAL VAGINAL DISCHARGE OR ITCHING   Items with * indicate a potential emergency and should be followed up as soon as possible or go to the Emergency Department if any problems should occur.  Please show the CHEMOTHERAPY ALERT CARD or IMMUNOTHERAPY ALERT CARD at check-in  to the Emergency Department and triage nurse.  Should you have questions after your visit or need to cancel or reschedule your appointment, please contact [facility]  [phone] and follow the prompts.  Office hours are 8:00 a.m. to 4:30 p.m. Monday - Friday. Please note that voicemails left after 4:00 p.m. may not be returned until the following business day.  We are closed weekends and major holidays. You have access to a nurse at all times for urgent questions. Please call the main number to the clinic [phone] and follow the prompts.  For any non-urgent questions, you may also contact your provider using MyChart. We now offer e-Visits for anyone 22 and older to request care online for non-urgent symptoms. For details visit [email or web address].   Also download the MyChart app! Go to the app store, search "MyChart", open the app, select [facility], and log in with your MyChart username and password.  Due to Covid, a mask is required upon entering the hospital/clinic. If you do not have a mask, one will be given to you upon arrival. For doctor visits, patients may have 1 support person aged 81 or older with them. For treatment visits, patients cannot have anyone with them due to current Covid guidelines and our immunocompromised population.

## 2020-09-27 ENCOUNTER — Inpatient Hospital Stay: Payer: Medicare HMO

## 2020-09-28 ENCOUNTER — Inpatient Hospital Stay: Payer: Medicare HMO

## 2020-09-29 ENCOUNTER — Inpatient Hospital Stay: Payer: Medicare HMO

## 2020-10-01 ENCOUNTER — Other Ambulatory Visit: Payer: Self-pay | Admitting: Oncology

## 2020-10-01 DIAGNOSIS — M25511 Pain in right shoulder: Secondary | ICD-10-CM | POA: Diagnosis not present

## 2020-10-06 ENCOUNTER — Inpatient Hospital Stay: Payer: Medicare HMO | Attending: Oncology

## 2020-10-06 ENCOUNTER — Other Ambulatory Visit: Payer: Self-pay

## 2020-10-06 DIAGNOSIS — D46C Myelodysplastic syndrome with isolated del(5q) chromosomal abnormality: Secondary | ICD-10-CM | POA: Diagnosis present

## 2020-10-06 DIAGNOSIS — D469 Myelodysplastic syndrome, unspecified: Secondary | ICD-10-CM

## 2020-10-06 DIAGNOSIS — Z8505 Personal history of malignant neoplasm of liver: Secondary | ICD-10-CM | POA: Insufficient documentation

## 2020-10-06 DIAGNOSIS — Z5111 Encounter for antineoplastic chemotherapy: Secondary | ICD-10-CM | POA: Insufficient documentation

## 2020-10-06 LAB — CBC WITH DIFFERENTIAL/PLATELET
Abs Immature Granulocytes: 0.02 10*3/uL (ref 0.00–0.07)
Basophils Absolute: 0 10*3/uL (ref 0.0–0.1)
Basophils Relative: 0 %
Eosinophils Absolute: 0.1 10*3/uL (ref 0.0–0.5)
Eosinophils Relative: 2 %
HCT: 43.1 % (ref 39.0–52.0)
Hemoglobin: 15.3 g/dL (ref 13.0–17.0)
Immature Granulocytes: 0 %
Lymphocytes Relative: 26 %
Lymphs Abs: 1.3 10*3/uL (ref 0.7–4.0)
MCH: 35.5 pg — ABNORMAL HIGH (ref 26.0–34.0)
MCHC: 35.5 g/dL (ref 30.0–36.0)
MCV: 100 fL (ref 80.0–100.0)
Monocytes Absolute: 0.5 10*3/uL (ref 0.1–1.0)
Monocytes Relative: 10 %
Neutro Abs: 3.1 10*3/uL (ref 1.7–7.7)
Neutrophils Relative %: 62 %
Platelets: 53 10*3/uL — ABNORMAL LOW (ref 150–400)
RBC: 4.31 MIL/uL (ref 4.22–5.81)
RDW: 14 % (ref 11.5–15.5)
WBC: 5 10*3/uL (ref 4.0–10.5)
nRBC: 0 % (ref 0.0–0.2)

## 2020-10-08 DIAGNOSIS — E138 Other specified diabetes mellitus with unspecified complications: Secondary | ICD-10-CM | POA: Diagnosis not present

## 2020-10-08 DIAGNOSIS — E785 Hyperlipidemia, unspecified: Secondary | ICD-10-CM | POA: Diagnosis not present

## 2020-10-08 DIAGNOSIS — K2100 Gastro-esophageal reflux disease with esophagitis, without bleeding: Secondary | ICD-10-CM | POA: Diagnosis not present

## 2020-10-08 DIAGNOSIS — J309 Allergic rhinitis, unspecified: Secondary | ICD-10-CM | POA: Diagnosis not present

## 2020-10-08 DIAGNOSIS — K219 Gastro-esophageal reflux disease without esophagitis: Secondary | ICD-10-CM | POA: Diagnosis not present

## 2020-10-08 DIAGNOSIS — I10 Essential (primary) hypertension: Secondary | ICD-10-CM | POA: Diagnosis not present

## 2020-10-08 DIAGNOSIS — J301 Allergic rhinitis due to pollen: Secondary | ICD-10-CM | POA: Diagnosis not present

## 2020-10-16 ENCOUNTER — Other Ambulatory Visit: Payer: Self-pay | Admitting: Oncology

## 2020-10-20 ENCOUNTER — Other Ambulatory Visit: Payer: Self-pay

## 2020-10-20 ENCOUNTER — Encounter: Payer: Self-pay | Admitting: Oncology

## 2020-10-20 ENCOUNTER — Inpatient Hospital Stay: Payer: Medicare HMO | Admitting: Oncology

## 2020-10-20 ENCOUNTER — Inpatient Hospital Stay: Payer: Medicare HMO

## 2020-10-20 VITALS — HR 76

## 2020-10-20 VITALS — BP 120/65 | HR 102 | Temp 97.3°F | Resp 18 | Wt 195.0 lb

## 2020-10-20 DIAGNOSIS — D469 Myelodysplastic syndrome, unspecified: Secondary | ICD-10-CM

## 2020-10-20 DIAGNOSIS — C220 Liver cell carcinoma: Secondary | ICD-10-CM | POA: Diagnosis not present

## 2020-10-20 DIAGNOSIS — Z8505 Personal history of malignant neoplasm of liver: Secondary | ICD-10-CM | POA: Diagnosis not present

## 2020-10-20 DIAGNOSIS — Z5111 Encounter for antineoplastic chemotherapy: Secondary | ICD-10-CM

## 2020-10-20 DIAGNOSIS — D46C Myelodysplastic syndrome with isolated del(5q) chromosomal abnormality: Secondary | ICD-10-CM | POA: Diagnosis not present

## 2020-10-20 LAB — CBC WITH DIFFERENTIAL/PLATELET
Abs Immature Granulocytes: 0.01 10*3/uL (ref 0.00–0.07)
Basophils Absolute: 0.1 10*3/uL (ref 0.0–0.1)
Basophils Relative: 4 %
Eosinophils Absolute: 0 10*3/uL (ref 0.0–0.5)
Eosinophils Relative: 1 %
HCT: 41.1 % (ref 39.0–52.0)
Hemoglobin: 14.7 g/dL (ref 13.0–17.0)
Immature Granulocytes: 1 %
Lymphocytes Relative: 43 %
Lymphs Abs: 0.9 10*3/uL (ref 0.7–4.0)
MCH: 35.4 pg — ABNORMAL HIGH (ref 26.0–34.0)
MCHC: 35.8 g/dL (ref 30.0–36.0)
MCV: 99 fL (ref 80.0–100.0)
Monocytes Absolute: 0.1 10*3/uL (ref 0.1–1.0)
Monocytes Relative: 5 %
Neutro Abs: 1 10*3/uL — ABNORMAL LOW (ref 1.7–7.7)
Neutrophils Relative %: 46 %
Platelets: 69 10*3/uL — ABNORMAL LOW (ref 150–400)
RBC: 4.15 MIL/uL — ABNORMAL LOW (ref 4.22–5.81)
RDW: 14.4 % (ref 11.5–15.5)
WBC: 2.1 10*3/uL — ABNORMAL LOW (ref 4.0–10.5)
nRBC: 0 % (ref 0.0–0.2)

## 2020-10-20 LAB — COMPREHENSIVE METABOLIC PANEL
ALT: 57 U/L — ABNORMAL HIGH (ref 0–44)
AST: 38 U/L (ref 15–41)
Albumin: 4.1 g/dL (ref 3.5–5.0)
Alkaline Phosphatase: 64 U/L (ref 38–126)
Anion gap: 7 (ref 5–15)
BUN: 14 mg/dL (ref 8–23)
CO2: 27 mmol/L (ref 22–32)
Calcium: 8.8 mg/dL — ABNORMAL LOW (ref 8.9–10.3)
Chloride: 103 mmol/L (ref 98–111)
Creatinine, Ser: 0.95 mg/dL (ref 0.61–1.24)
GFR, Estimated: >60 mL/min (ref >60)
Glucose, Bld: 191 mg/dL — ABNORMAL HIGH (ref 70–99)
Potassium: 4 mmol/L (ref 3.5–5.1)
Sodium: 137 mmol/L (ref 135–145)
Total Bilirubin: 1.2 mg/dL (ref 0.3–1.2)
Total Protein: 6.8 g/dL (ref 6.5–8.1)

## 2020-10-20 MED ORDER — ONDANSETRON HCL 4 MG PO TABS
8.0000 mg | ORAL_TABLET | Freq: Once | ORAL | Status: AC
  Administered 2020-10-20: 8 mg via ORAL
  Filled 2020-10-20: qty 2

## 2020-10-20 MED ORDER — AZACITIDINE CHEMO SQ INJECTION
75.0000 mg/m2 | Freq: Once | INTRAMUSCULAR | Status: AC
  Administered 2020-10-20: 162.5 mg via SUBCUTANEOUS
  Filled 2020-10-20: qty 6.5

## 2020-10-20 NOTE — Progress Notes (Signed)
Pt is wondering if he may change his chemo schedule from afternoons to mornings if possible.

## 2020-10-20 NOTE — Patient Instructions (Signed)
[facility] ONCOLOGY  Discharge Instructions: Thank you for choosing [facility] to provide your oncology and hematology care.  If you have a lab appointment with the [facility], please go directly to the [facility] and check in at the registration area.  Wear comfortable clothing and clothing appropriate for easy access to any Portacath or PICC line.   We strive to give you quality time with your provider. You may need to reschedule your appointment if you arrive late (15 or more minutes).  Arriving late affects you and other patients whose appointments are after yours.  Also, if you miss three or more appointments without notifying the office, you may be dismissed from the clinic at the provider's discretion.      For prescription refill requests, have your pharmacy contact our office and allow 72 hours for refills to be completed.    Today you received the following chemotherapy and/or immunotherapy agents vidaza    To help prevent nausea and vomiting after your treatment, we encourage you to take your nausea medication as directed.  BELOW ARE SYMPTOMS THAT SHOULD BE REPORTED IMMEDIATELY: *FEVER GREATER THAN 100.4 F (38 C) OR HIGHER *CHILLS OR SWEATING *NAUSEA AND VOMITING THAT IS NOT CONTROLLED WITH YOUR NAUSEA MEDICATION *UNUSUAL SHORTNESS OF BREATH *UNUSUAL BRUISING OR BLEEDING *URINARY PROBLEMS (pain or burning when urinating, or frequent urination) *BOWEL PROBLEMS (unusual diarrhea, constipation, pain near the anus) TENDERNESS IN MOUTH AND THROAT WITH OR WITHOUT PRESENCE OF ULCERS (sore throat, sores in mouth, or a toothache) UNUSUAL RASH, SWELLING OR PAIN  UNUSUAL VAGINAL DISCHARGE OR ITCHING   Items with * indicate a potential emergency and should be followed up as soon as possible or go to the Emergency Department if any problems should occur.  Please show the CHEMOTHERAPY ALERT CARD or IMMUNOTHERAPY ALERT CARD at check-in to  the Emergency Department and triage nurse.  Should you have questions after your visit or need to cancel or reschedule your appointment, please contact [facility]  [phone] and follow the prompts.  Office hours are 8:00 a.m. to 4:30 p.m. Monday - Friday. Please note that voicemails left after 4:00 p.m. may not be returned until the following business day.  We are closed weekends and major holidays. You have access to a nurse at all times for urgent questions. Please call the main number to the clinic [phone] and follow the prompts.  For any non-urgent questions, you may also contact your provider using MyChart. We now offer e-Visits for anyone 25 and older to request care online for non-urgent symptoms. For details visit [email or web address].   Also download the MyChart app! Go to the app store, search "MyChart", open the app, select , and log in with your MyChart username and password.  Due to Covid, a mask is required upon entering the hospital/clinic. If you do not have a mask, one will be given to you upon arrival. For doctor visits, patients may have 1 support person aged 26 or older with them. For treatment visits, patients cannot have anyone with them due to current Covid guidelines and our immunocompromised population.

## 2020-10-20 NOTE — Progress Notes (Signed)
Labs reviewed with MD and treatment team. Per MD to proceed with treatment. Treatment team updated.   Timothy Murray  

## 2020-10-20 NOTE — Progress Notes (Signed)
Hematology/Oncology Consult note [facility]  Telephone:([phone] [phone]  Patient Care Team: Danelle Berry, NP as PCP - General (Nurse Practitioner) Dionisio David, MD (Cardiology) Sindy Guadeloupe, MD as Consulting Physician (Hematology and Oncology)   Name of the patient: Timothy Murray  [id number]  Nov 13, 1945   Date of visit: 10/20/20  Diagnosis-  MDS with 5q del but high risk given TP53 mutation 2. Relampago s/p thermal ablation  Chief complaint/ Reason for visit-on treatment assessment prior to cycle 6-day 1 of Vidaza  Heme/Onc history: Patient is a 75 year old male with a history of cirrhosis and hepatocellular carcinoma for which she had proton beam radiation In June 2021.  He has baseline thrombocytopenia which was initially attributed to his cirrhosis.  He was found to have progressive pancytopenia with a steady decline in his white cell count since March 2022 when it was down to 3.4 with a platelet count of 53.  He was hospitalized sometime in April 2022 with acute biliary gallstone pancreatitis .  He underwent ERCP inpatient with platelet transfusion which showed sludge and biliary sphincterotomy was performed.  His bilirubin which was elevated up to 7.2 has come down to 2.4 presently.   However his pancytopenia has continued to worsen and presently white count is 1.1 with an H&H of 6.2/17.6 with an MCV of 102 and a platelet count of 33.Bone marrow biopsy showed evidence of dyspoietic changes in the erythroid and megakaryocyte excel lines associated with ring sideroblasts.  Definite increase in blastic cells not identified.  Overall features favor myelodysplastic syndrome.  Karyotype was normal but only for metaphase cells were analyzed which were not sufficient.  [facility] FISH MDS panel showed deletion of 5 q. along with monosomy 7 and deletion 7 q. without excess blasts.  SF3B1 mutation negative.   Peripheral blood intellegin myeloid  panel showed :   Detected     Change       Frequency (%) Impact  TP53   c.716A>C     p.Asn239Thr  19            Tier I  U2AF1  c.470A>C     p.Gln157Pro  17            Tier I  IDH1   c.315C>T     p.Gly105Gly  69            Tier II      With regards to his hepatocellular carcinoma patient had an MRI abdomen on 04/24/2020 which showed that his primary liver mass which was previously 4.3 x 3.6 cmWas similar in size at 4.5 x 3.5 cm.    Patient was briefly on Revlimid for 5 q. deletion but after T p53 came back he was switched to [place] for 5 days every 28 days starting 05/27/2020    Patient was seen by Dr. Marvel Plan at [facility] for second opinion and he also had a repeat bone marrow biopsyDone on 08/14/2020.  Biopsy showed normocellular bone marrow with erythroid predominant trilineage hematopoiesis.  Megakaryocytes were typical in number with few hypolobated forms.  Full sequence of orderly maturation noted in both granulopoiesis and erythropoiesis.  3% blasts were seen by manual differential.   Plan is to continue Vidaza until progression or toxicity    Interval history- Patient is doing well overall.  He has gained weight from 189 pounds to 195 pounds presently.  Denies any recurrent infections.  ECOG PS- 1 Pain scale- 0  Review of systems-  Review of Systems  Constitutional:  Positive for malaise/fatigue. Negative for chills, fever and weight loss.  HENT:  Negative for congestion, ear discharge and nosebleeds.   Eyes:  Negative for blurred vision.  Respiratory:  Negative for cough, hemoptysis, sputum production, shortness of breath and wheezing.   Cardiovascular:  Negative for chest pain, palpitations, orthopnea and claudication.  Gastrointestinal:  Negative for abdominal pain, blood in stool, constipation, diarrhea, heartburn, melena, nausea and vomiting.  Genitourinary:  Negative for dysuria, flank pain, frequency, hematuria and urgency.  Musculoskeletal:  Negative for back pain, joint pain and  myalgias.  Skin:  Negative for rash.  Neurological:  Negative for dizziness, tingling, focal weakness, seizures, weakness and headaches.  Endo/Heme/Allergies:  Does not bruise/bleed easily.  Psychiatric/Behavioral:  Negative for depression and suicidal ideas. The patient does not have insomnia.      Allergies  Allergen Reactions   Dome-Paste Bandage [Wound Dressings]     All bandaids cause a rash   Soap & Cleansers     "liquid soap" antibacterial -rash   Latex Rash and Itching     Past Medical History:  Diagnosis Date   Arthritis    "lower back" (10/24/2012)   Coronary artery disease    GERD (gastroesophageal reflux disease)    High cholesterol    Hypertension    Liver cancer ([place])    MDS (myelodysplastic syndrome) ([place])    Type II diabetes mellitus ([place])      Past Surgical History:  Procedure Laterality Date   APPENDECTOMY  2002   CARDIAC CATHETERIZATION  10/24/2012   CORONARY ARTERY BYPASS GRAFT N/A 10/26/2012   Procedure: CORONARY ARTERY BYPASS GRAFTING (CABG);  Surgeon: Melrose Nakayama, MD;  Location: [facility];  Service: Open Heart Surgery;  Laterality: N/A;  Times 3 using left internal mammary artery and endoscopically harvested right saphenous vein   ERCP N/A 04/25/2020   Procedure: ENDOSCOPIC RETROGRADE CHOLANGIOPANCREATOGRAPHY (ERCP);  Surgeon: Lucilla Lame, MD;  Location: [facility] ENDOSCOPY;  Service: Endoscopy;  Laterality: N/A;    Social History   Socioeconomic History   Marital status: Married    Spouse name: Not on file   Number of children: Not on file   Years of education: Not on file   Highest education level: Not on file  Occupational History   Not on file  Tobacco Use   Smoking status: Former    Packs/day: 0.50    Years: 30.00    Pack years: 15.00    Types: Cigarettes    Quit date: 08/28/2001    Years since quitting: 19.1   Smokeless tobacco: Never  Vaping Use   Vaping Use: Never used  Substance and Sexual Activity   Alcohol use: No     Alcohol/week: 0.0 standard drinks    Comment: 10/24/2012 "stopped drinking alcohol in 2003; used to drink ~ 9 bottles beer/wk"   Drug use: No   Sexual activity: Yes  Other Topics Concern   Not on file  Social History Narrative   Not on file   Social Determinants of Health   Financial Resource Strain: Not on file  Food Insecurity: Not on file  Transportation Needs: Not on file  Physical Activity: Not on file  Stress: Not on file  Social Connections: Not on file  Intimate Partner Violence: Not on file    Family History  Problem Relation Age of Onset   Cancer Sister      Current Outpatient Medications:    acyclovir (ZOVIRAX) 400 MG tablet, TAKE 1  TABLET BY MOUTH TWICE A DAY, Disp: 60 tablet, Rfl: 1   atorvastatin (LIPITOR) 40 MG tablet, Take by mouth., Disp: , Rfl:    glimepiride (AMARYL) 1 MG tablet, Take 1 mg by mouth daily., Disp: , Rfl:    levofloxacin (LEVAQUIN) 500 MG tablet, TAKE 1 TABLET (500 MG TOTAL) BY MOUTH DAILY., Disp: 30 tablet, Rfl: 0   loratadine (CLARITIN) 10 MG tablet, Take 5 mg by mouth daily as needed. Take half a tablet (85m) daily, Disp: , Rfl:    metoprolol tartrate (LOPRESSOR) 25 MG tablet, Take 25 mg by mouth daily., Disp: , Rfl:    omeprazole (PRILOSEC) 40 MG capsule, TAKE 1 CAPSULE BY MOUTH TWICE A DAY, Disp: 60 capsule, Rfl: 0   oxyCODONE (OXY IR/ROXICODONE) 5 MG immediate release tablet, Take 1 tablet (5 mg total) by mouth every 8 (eight) hours as needed for severe pain., Disp: 90 tablet, Rfl: 0   senna-docusate (SENOKOT-S) 8.6-50 MG tablet, Take 1 tablet by mouth every morning., Disp: , Rfl:    traZODone (DESYREL) 50 MG tablet, TAKE 1-2 TABLETS BY MOUTH AT BEDTIME., Disp: 180 tablet, Rfl: 1   gabapentin (NEURONTIN) 300 MG capsule, Take 1 capsule (300 mg total) by mouth at bedtime. (Patient not taking: No sig reported), Disp: 30 capsule, Rfl: 0   LORazepam (ATIVAN) 0.5 MG tablet, Take 1 tablet (0.5 mg total) by mouth every 6 (six) hours as needed  (Nausea or vomiting). (Patient not taking: No sig reported), Disp: 30 tablet, Rfl: 0   losartan (COZAAR) 25 MG tablet, Take 25 mg by mouth daily. (Patient not taking: No sig reported), Disp: , Rfl:    nitroGLYCERIN (NITROSTAT) 0.3 MG SL tablet, nitroglycerin 0.3 mg sublingual tablet  PLACE ONE TABLET (0.3 MG DOSE) UNDER THE TONGUE EVERY 5 (FIVE) MINUTES AS NEEDED FOR CHEST PAIN. (Patient not taking: No sig reported), Disp: , Rfl:    omeprazole (PRILOSEC) 20 MG capsule, Take 40 mg by mouth every morning. (Patient not taking: No sig reported), Disp: , Rfl:    ondansetron (ZOFRAN) 8 MG tablet, Take 1 tablet (8 mg total) by mouth 2 (two) times daily as needed (Nausea or vomiting). (Patient not taking: No sig reported), Disp: 30 tablet, Rfl: 1   predniSONE (DELTASONE) 20 MG tablet, TAKE 1 TABLET BY MOUTH DAILY X 5 DAYS IN AM WITH FOOD (Patient not taking: No sig reported), Disp: , Rfl:    prochlorperazine (COMPAZINE) 10 MG tablet, Take 1 tablet (10 mg total) by mouth every 6 (six) hours as needed (Nausea or vomiting). (Patient not taking: No sig reported), Disp: 30 tablet, Rfl: 1  Physical exam:  Vitals:   10/20/20 0923  BP: 120/65  Pulse: (!) 102  Resp: 18  Temp: (!) 97.3 F (36.3 C)  SpO2: 100%  Weight: 195 lb (88.5 kg)   Physical Exam Constitutional:      General: He is not in acute distress. Cardiovascular:     Rate and Rhythm: Normal rate and regular rhythm.     Heart sounds: Normal heart sounds.  Pulmonary:     Effort: Pulmonary effort is normal.     Breath sounds: Normal breath sounds.  Abdominal:     General: Bowel sounds are normal.     Palpations: Abdomen is soft.  Musculoskeletal:     Comments: Trace bilateral edema  Skin:    General: Skin is warm and dry.  Neurological:     Mental Status: He is alert and oriented to person, place, and time.  CMP Latest Ref Rng & Units 10/20/2020  Glucose 70 - 99 mg/dL 191(H)  BUN 8 - 23 mg/dL 14  Creatinine 0.61 - 1.24 mg/dL 0.95   Sodium 135 - 145 mmol/L 137  Potassium 3.5 - 5.1 mmol/L 4.0  Chloride 98 - 111 mmol/L 103  CO2 22 - 32 mmol/L 27  Calcium 8.9 - 10.3 mg/dL 8.8(L)  Total Protein 6.5 - 8.1 g/dL 6.8  Total Bilirubin 0.3 - 1.2 mg/dL 1.2  Alkaline Phos 38 - 126 U/L 64  AST 15 - 41 U/L 38  ALT 0 - 44 U/L 57(H)   CBC Latest Ref Rng & Units 10/20/2020  WBC 4.0 - 10.5 K/uL 2.1(L)  Hemoglobin 13.0 - 17.0 g/dL 14.7  Hematocrit 39.0 - 52.0 % 41.1  Platelets 150 - 400 K/uL 69(L)    Assessment and plan- Patient is a 75 y.o. male  with high risk MDS and T p53 mutation.  He is here for on treatment assessment prior to cycle 6-day 1 of Vidaza  After 2 cycles of Vidaza patient's white count had improved to 3.4 and his platelets had normalized.  Subsequently he is platelets have drifted down but remained stable in the 60s and white count remains around 2 with an [place] of 1 which was similar to his count prior to cycle 5 of Vidaza.  His leukopenia/thrombocytopenia could either be due to underlying MDS versus Vidaza itself.  However he is not currently transfusion dependent and his hemoglobin is improved to 14.7.  Also his neutropenia was no worse 2 weeks after receiving his last Vidaza and in fact his white count had improved to 5.0 at that point.  I will therefore continue to give him Vidaza at 75 mg per metered square every 4 weeks for 5 continuous days until progression or toxicity.  If there is further worsening of his counts we could consider doing a bone marrow biopsy versus reducing the dose of Vidaza further to see if it improves his counts any.  Patient does have an appointment at [facility] next month as well.  He will continue acyclovir and Levaquin prophylaxis at this time.  History of localized hepatocellular carcinoma s/p liver directed treatment: We will repeat MRI abdomen with and without contrast next month and also check AFP at that time.   Visit Diagnosis 1. MDS (myelodysplastic syndrome) ([place])   2. Encounter  for antineoplastic chemotherapy   3. Hepatocellular carcinoma ([place])      Dr. Randa Evens, MD, MPH [facility] at [facility] [phone] 10/20/2020 12:16 PM

## 2020-10-21 ENCOUNTER — Inpatient Hospital Stay: Payer: Medicare HMO

## 2020-10-21 VITALS — BP 133/68 | HR 87 | Temp 99.5°F | Resp 18

## 2020-10-21 DIAGNOSIS — D46C Myelodysplastic syndrome with isolated del(5q) chromosomal abnormality: Secondary | ICD-10-CM | POA: Diagnosis not present

## 2020-10-21 DIAGNOSIS — Z8505 Personal history of malignant neoplasm of liver: Secondary | ICD-10-CM | POA: Diagnosis not present

## 2020-10-21 DIAGNOSIS — Z5111 Encounter for antineoplastic chemotherapy: Secondary | ICD-10-CM | POA: Diagnosis not present

## 2020-10-21 DIAGNOSIS — D469 Myelodysplastic syndrome, unspecified: Secondary | ICD-10-CM

## 2020-10-21 MED ORDER — AZACITIDINE CHEMO SQ INJECTION
75.0000 mg/m2 | Freq: Once | INTRAMUSCULAR | Status: AC
  Administered 2020-10-21: 162.5 mg via SUBCUTANEOUS
  Filled 2020-10-21: qty 6.5

## 2020-10-21 MED ORDER — ONDANSETRON HCL 4 MG PO TABS
8.0000 mg | ORAL_TABLET | Freq: Once | ORAL | Status: AC
  Administered 2020-10-21: 8 mg via ORAL
  Filled 2020-10-21: qty 2

## 2020-10-22 ENCOUNTER — Inpatient Hospital Stay: Payer: Medicare HMO

## 2020-10-22 ENCOUNTER — Other Ambulatory Visit: Payer: Self-pay

## 2020-10-22 VITALS — BP 136/67 | HR 68 | Temp 96.0°F | Resp 20

## 2020-10-22 DIAGNOSIS — Z5111 Encounter for antineoplastic chemotherapy: Secondary | ICD-10-CM | POA: Diagnosis not present

## 2020-10-22 DIAGNOSIS — Z8505 Personal history of malignant neoplasm of liver: Secondary | ICD-10-CM | POA: Diagnosis not present

## 2020-10-22 DIAGNOSIS — D46C Myelodysplastic syndrome with isolated del(5q) chromosomal abnormality: Secondary | ICD-10-CM | POA: Diagnosis not present

## 2020-10-22 DIAGNOSIS — D469 Myelodysplastic syndrome, unspecified: Secondary | ICD-10-CM

## 2020-10-22 MED ORDER — AZACITIDINE CHEMO SQ INJECTION
75.0000 mg/m2 | Freq: Once | INTRAMUSCULAR | Status: AC
  Administered 2020-10-22: 162.5 mg via SUBCUTANEOUS
  Filled 2020-10-22: qty 6.5

## 2020-10-22 MED ORDER — ONDANSETRON HCL 4 MG PO TABS
8.0000 mg | ORAL_TABLET | Freq: Once | ORAL | Status: AC
  Administered 2020-10-22: 8 mg via ORAL
  Filled 2020-10-22: qty 2

## 2020-10-23 ENCOUNTER — Inpatient Hospital Stay: Payer: Medicare HMO

## 2020-10-23 VITALS — BP 120/66 | HR 62 | Temp 96.6°F | Resp 18

## 2020-10-23 DIAGNOSIS — Z8505 Personal history of malignant neoplasm of liver: Secondary | ICD-10-CM | POA: Diagnosis not present

## 2020-10-23 DIAGNOSIS — D46C Myelodysplastic syndrome with isolated del(5q) chromosomal abnormality: Secondary | ICD-10-CM | POA: Diagnosis not present

## 2020-10-23 DIAGNOSIS — D469 Myelodysplastic syndrome, unspecified: Secondary | ICD-10-CM

## 2020-10-23 DIAGNOSIS — Z5111 Encounter for antineoplastic chemotherapy: Secondary | ICD-10-CM | POA: Diagnosis not present

## 2020-10-23 MED ORDER — AZACITIDINE CHEMO SQ INJECTION
75.0000 mg/m2 | Freq: Once | INTRAMUSCULAR | Status: AC
  Administered 2020-10-23: 162.5 mg via SUBCUTANEOUS
  Filled 2020-10-23: qty 6.5

## 2020-10-23 MED ORDER — ONDANSETRON HCL 4 MG PO TABS
8.0000 mg | ORAL_TABLET | Freq: Once | ORAL | Status: AC
  Administered 2020-10-23: 8 mg via ORAL
  Filled 2020-10-23: qty 2

## 2020-10-23 NOTE — Patient Instructions (Signed)
[facility] ONCOLOGY  Discharge Instructions: Thank you for choosing [facility] to provide your oncology and hematology care.  If you have a lab appointment with the [facility], please go directly to the [facility] and check in at the registration area.  Wear comfortable clothing and clothing appropriate for easy access to any Portacath or PICC line.   We strive to give you quality time with your provider. You may need to reschedule your appointment if you arrive late (15 or more minutes).  Arriving late affects you and other patients whose appointments are after yours.  Also, if you miss three or more appointments without notifying the office, you may be dismissed from the clinic at the provider's discretion.      For prescription refill requests, have your pharmacy contact our office and allow 72 hours for refills to be completed.    Today you received the following chemotherapy and/or immunotherapy agents : Vidaza    To help prevent nausea and vomiting after your treatment, we encourage you to take your nausea medication as directed.  BELOW ARE SYMPTOMS THAT SHOULD BE REPORTED IMMEDIATELY: *FEVER GREATER THAN 100.4 F (38 C) OR HIGHER *CHILLS OR SWEATING *NAUSEA AND VOMITING THAT IS NOT CONTROLLED WITH YOUR NAUSEA MEDICATION *UNUSUAL SHORTNESS OF BREATH *UNUSUAL BRUISING OR BLEEDING *URINARY PROBLEMS (pain or burning when urinating, or frequent urination) *BOWEL PROBLEMS (unusual diarrhea, constipation, pain near the anus) TENDERNESS IN MOUTH AND THROAT WITH OR WITHOUT PRESENCE OF ULCERS (sore throat, sores in mouth, or a toothache) UNUSUAL RASH, SWELLING OR PAIN  UNUSUAL VAGINAL DISCHARGE OR ITCHING   Items with * indicate a potential emergency and should be followed up as soon as possible or go to the Emergency Department if any problems should occur.  Please show the CHEMOTHERAPY ALERT CARD or IMMUNOTHERAPY ALERT CARD at check-in to  the Emergency Department and triage nurse.  Should you have questions after your visit or need to cancel or reschedule your appointment, please contact [facility]  [phone] and follow the prompts.  Office hours are 8:00 a.m. to 4:30 p.m. Monday - Friday. Please note that voicemails left after 4:00 p.m. may not be returned until the following business day.  We are closed weekends and major holidays. You have access to a nurse at all times for urgent questions. Please call the main number to the clinic [phone] and follow the prompts.  For any non-urgent questions, you may also contact your provider using MyChart. We now offer e-Visits for anyone 74 and older to request care online for non-urgent symptoms. For details visit [email or web address].   Also download the MyChart app! Go to the app store, search "MyChart", open the app, select [facility], and log in with your MyChart username and password.  Due to Covid, a mask is required upon entering the hospital/clinic. If you do not have a mask, one will be given to you upon arrival. For doctor visits, patients may have 1 support person aged 74 or older with them. For treatment visits, patients cannot have anyone with them due to current Covid guidelines and our immunocompromised population.

## 2020-10-24 ENCOUNTER — Inpatient Hospital Stay: Payer: Medicare HMO

## 2020-10-24 ENCOUNTER — Other Ambulatory Visit: Payer: Self-pay

## 2020-10-24 VITALS — BP 121/68 | HR 72 | Temp 96.0°F | Resp 18

## 2020-10-24 DIAGNOSIS — Z8505 Personal history of malignant neoplasm of liver: Secondary | ICD-10-CM | POA: Diagnosis not present

## 2020-10-24 DIAGNOSIS — D469 Myelodysplastic syndrome, unspecified: Secondary | ICD-10-CM

## 2020-10-24 DIAGNOSIS — D46C Myelodysplastic syndrome with isolated del(5q) chromosomal abnormality: Secondary | ICD-10-CM | POA: Diagnosis not present

## 2020-10-24 DIAGNOSIS — Z5111 Encounter for antineoplastic chemotherapy: Secondary | ICD-10-CM | POA: Diagnosis not present

## 2020-10-24 MED ORDER — ONDANSETRON HCL 4 MG PO TABS
8.0000 mg | ORAL_TABLET | Freq: Once | ORAL | Status: AC
  Administered 2020-10-24: 8 mg via ORAL
  Filled 2020-10-24: qty 2

## 2020-10-24 MED ORDER — AZACITIDINE CHEMO SQ INJECTION
75.0000 mg/m2 | Freq: Once | INTRAMUSCULAR | Status: AC
  Administered 2020-10-24: 162.5 mg via SUBCUTANEOUS
  Filled 2020-10-24: qty 6.5

## 2020-10-24 NOTE — Patient Instructions (Signed)
[facility]COLOG[facility]  Discharge Instructions: Thank you for choosing [facility] to provide your oncology and hematology care.  If you have a lab appointment with the [facility], please go directly to the [facility] and check in at the registration area.  Wear comfortable clothing and clothing appropriate for easy access to any Portacath or PICC line.   We strive to give you quality time with your provider. You may need to reschedule your appointment if you arrive late (15 or more minutes).  Arriving late affects you and other patients whose appointments are after yours.  Also, if you miss three or more appointments without notifying the office, you may be dismissed from the clinic at the provider's discretion.      For prescription refill requests, have your pharmacy contact our office and allow 72 hours for refills to be completed.    Today you received the following chemotherapy and/or immunotherapy agents Vidaza      To help prevent nausea and vomiting after your treatment, we encourage you to take your nausea medication as directed.  BELOW ARE SYMPTOMS THAT SHOULD BE REPORTED IMMEDIATELY: *FEVER GREATER THAN 100.4 F (38 C) OR HIGHER *CHILLS OR SWEATING *NAUSEA AND VOMITING THAT IS NOT CONTROLLED WITH YOUR NAUSEA MEDICATION *UNUSUAL SHORTNESS OF BREATH *UNUSUAL BRUISING OR BLEEDING *URINARY PROBLEMS (pain or burning when urinating, or frequent urination) *BOWEL PROBLEMS (unusual diarrhea, constipation, pain near the anus) TENDERNESS IN MOUTH AND THROAT WITH OR WITHOUT PRESENCE OF ULCERS (sore throat, sores in mouth, or a toothache) UNUSUAL RASH, SWELLING OR PAIN  UNUSUAL VAGINAL DISCHARGE OR ITCHING   Items with * indicate a potential emergency and should be followed up as soon as possible or go to the Emergency Department if any problems should occur.  Please show the CHEMOTHERAPY ALERT CARD or IMMUNOTHERAPY ALERT CARD at check-in to  the Emergency Department and triage nurse.  Should you have questions after your visit or need to cancel or reschedule your appointment, please contact [facility]  [phone] and follow the prompts.  Office hours are 8:00 a.m. to 4:30 p.m. Monday - Friday. Please note that voicemails left after 4:00 p.m. may not be returned until the following business day.  We are closed weekends and major holidays. You have access to a nurse at all times for urgent questions. Please call the main number to the clinic [phone] and follow the prompts.  For any non-urgent questions, you may also contact your provider using MyChart. We now offer e-Visits for anyone 3 and older to request care online for non-urgent symptoms. For details visit [email or web address].   Also download the MyChart app! Go to the app store, search "MyChart", open the app, select [facility], and log in with your MyChart username and password.  Due to Covid, a mask is required upon entering the hospital/clinic. If you do not have a mask, one will be given to you upon arrival. For doctor visits, patients may have 1 support person aged 64 or older with them. For treatment visits, patients cannot have anyone with them due to current Covid guidelines and our immunocompromised population.

## 2020-11-05 ENCOUNTER — Other Ambulatory Visit: Payer: Self-pay | Admitting: Oncology

## 2020-11-10 ENCOUNTER — Other Ambulatory Visit: Payer: Self-pay | Admitting: Oncology

## 2020-11-10 DIAGNOSIS — D469 Myelodysplastic syndrome, unspecified: Secondary | ICD-10-CM

## 2020-11-12 ENCOUNTER — Ambulatory Visit: Payer: Medicare HMO

## 2020-11-15 ENCOUNTER — Other Ambulatory Visit: Payer: Self-pay | Admitting: Oncology

## 2020-11-16 ENCOUNTER — Encounter: Payer: Self-pay | Admitting: Oncology

## 2020-11-17 ENCOUNTER — Other Ambulatory Visit: Payer: Self-pay

## 2020-11-17 ENCOUNTER — Inpatient Hospital Stay: Payer: Medicare HMO | Admitting: Oncology

## 2020-11-17 ENCOUNTER — Encounter: Payer: Self-pay | Admitting: Oncology

## 2020-11-17 ENCOUNTER — Inpatient Hospital Stay: Payer: Medicare HMO | Attending: Oncology

## 2020-11-17 ENCOUNTER — Inpatient Hospital Stay: Payer: Medicare HMO

## 2020-11-17 VITALS — BP 124/62 | HR 68 | Temp 98.2°F | Resp 18 | Wt 192.0 lb

## 2020-11-17 DIAGNOSIS — K7460 Unspecified cirrhosis of liver: Secondary | ICD-10-CM | POA: Diagnosis not present

## 2020-11-17 DIAGNOSIS — D701 Agranulocytosis secondary to cancer chemotherapy: Secondary | ICD-10-CM

## 2020-11-17 DIAGNOSIS — T451X5A Adverse effect of antineoplastic and immunosuppressive drugs, initial encounter: Secondary | ICD-10-CM | POA: Diagnosis not present

## 2020-11-17 DIAGNOSIS — Z5111 Encounter for antineoplastic chemotherapy: Secondary | ICD-10-CM | POA: Insufficient documentation

## 2020-11-17 DIAGNOSIS — D469 Myelodysplastic syndrome, unspecified: Secondary | ICD-10-CM

## 2020-11-17 DIAGNOSIS — D61818 Other pancytopenia: Secondary | ICD-10-CM | POA: Insufficient documentation

## 2020-11-17 DIAGNOSIS — Z8505 Personal history of malignant neoplasm of liver: Secondary | ICD-10-CM | POA: Insufficient documentation

## 2020-11-17 DIAGNOSIS — C220 Liver cell carcinoma: Secondary | ICD-10-CM

## 2020-11-17 DIAGNOSIS — D46C Myelodysplastic syndrome with isolated del(5q) chromosomal abnormality: Secondary | ICD-10-CM | POA: Insufficient documentation

## 2020-11-17 LAB — COMPREHENSIVE METABOLIC PANEL
ALT: 30 U/L (ref 0–44)
AST: 32 U/L (ref 15–41)
Albumin: 4 g/dL (ref 3.5–5.0)
Alkaline Phosphatase: 60 U/L (ref 38–126)
Anion gap: 8 (ref 5–15)
BUN: 14 mg/dL (ref 8–23)
CO2: 29 mmol/L (ref 22–32)
Calcium: 8.7 mg/dL — ABNORMAL LOW (ref 8.9–10.3)
Chloride: 101 mmol/L (ref 98–111)
Creatinine, Ser: 0.98 mg/dL (ref 0.61–1.24)
GFR, Estimated: >60 mL/min (ref >60)
Glucose, Bld: 231 mg/dL — ABNORMAL HIGH (ref 70–99)
Potassium: 4.1 mmol/L (ref 3.5–5.1)
Sodium: 138 mmol/L (ref 135–145)
Total Bilirubin: 1.6 mg/dL — ABNORMAL HIGH (ref 0.3–1.2)
Total Protein: 6.9 g/dL (ref 6.5–8.1)

## 2020-11-17 LAB — CBC WITH DIFFERENTIAL/PLATELET
Abs Immature Granulocytes: 0 10*3/uL (ref 0.00–0.07)
Basophils Absolute: 0.1 10*3/uL (ref 0.0–0.1)
Basophils Relative: 5 %
Eosinophils Absolute: 0 10*3/uL (ref 0.0–0.5)
Eosinophils Relative: 3 %
HCT: 40 % (ref 39.0–52.0)
Hemoglobin: 14.2 g/dL (ref 13.0–17.0)
Immature Granulocytes: 0 %
Lymphocytes Relative: 54 %
Lymphs Abs: 0.8 10*3/uL (ref 0.7–4.0)
MCH: 35.2 pg — ABNORMAL HIGH (ref 26.0–34.0)
MCHC: 35.5 g/dL (ref 30.0–36.0)
MCV: 99.3 fL (ref 80.0–100.0)
Monocytes Absolute: 0.1 10*3/uL (ref 0.1–1.0)
Monocytes Relative: 7 %
Neutro Abs: 0.5 10*3/uL — ABNORMAL LOW (ref 1.7–7.7)
Neutrophils Relative %: 31 %
Platelets: 89 10*3/uL — ABNORMAL LOW (ref 150–400)
RBC: 4.03 MIL/uL — ABNORMAL LOW (ref 4.22–5.81)
RDW: 14.8 % (ref 11.5–15.5)
WBC: 1.5 10*3/uL — ABNORMAL LOW (ref 4.0–10.5)
nRBC: 0 % (ref 0.0–0.2)

## 2020-11-17 NOTE — Progress Notes (Signed)
Hematology/Oncology Consult note [facility]  [phone] [phone]  Patient Care Team: Danelle Berry, NP as PCP - General (Nurse Practitioner) Dionisio David, MD (Cardiology) Sindy Guadeloupe, MD as Consulting Physician (Hematology and Oncology)   Name of the patient: Timothy Murray  [id number]  31-Aug-1945   Date of visit: 11/17/20  Diagnosis-  MDS with 5q del but high risk given TP53 mutation 2. [place] s/p thermal ablation  Chief complaint/ Reason for visit-on treatment assessment prior to cycle 7-day 1 of Vidaza  Heme/Onc history: Patient is a 75 year old male with a history of cirrhosis and hepatocellular carcinoma for which she had proton beam radiation In June 2021.  He has baseline thrombocytopenia which was initially attributed to his cirrhosis.  He was found to have progressive pancytopenia with a steady decline in his white cell count since March 2022 when it was down to 3.4 with a platelet count of 53.  He was hospitalized sometime in April 2022 with acute biliary gallstone pancreatitis .  He underwent ERCP inpatient with platelet transfusion which showed sludge and biliary sphincterotomy was performed.  His bilirubin which was elevated up to 7.2 has come down to 2.4 presently.   However his pancytopenia has continued to worsen and presently white count is 1.1 with an H&H of 6.2/17.6 with an MCV of 102 and a platelet count of 33.Bone marrow biopsy showed evidence of dyspoietic changes in the erythroid and megakaryocyte excel lines associated with ring sideroblasts.  Definite increase in blastic cells not identified.  Overall features favor myelodysplastic syndrome.  Karyotype was normal but only for metaphase cells were analyzed which were not sufficient.  [facility] FISH MDS panel showed deletion of 5 q. along with monosomy 7 and deletion 7 q. without excess blasts.  SF3B1 mutation negative.   Peripheral blood intellegin myeloid  panel showed :   Detected     Change       Frequency (%) Impact  TP53   c.716A>C     p.Asn239Thr  19            Tier I  U2AF1  c.470A>C     p.Gln157Pro  17            Tier I  IDH1   c.315C>T     p.Gly105Gly  12            Tier II      With regards to his hepatocellular carcinoma patient had an MRI abdomen on 04/24/2020 which showed that his primary liver mass which was previously 4.3 x 3.6 cmWas similar in size at 4.5 x 3.5 cm.    Patient was briefly on Revlimid for 5 q. deletion but after T p53 came back he was switched to [place] for 5 days every 28 days starting 05/27/2020    Patient was seen by Dr. Marvel Plan at [facility] for second opinion and he also had a repeat bone marrow biopsyDone on 08/14/2020.  Biopsy showed normocellular bone marrow with erythroid predominant trilineage hematopoiesis.  Megakaryocytes were typical in number with few hypolobated forms.  Full sequence of orderly maturation noted in both granulopoiesis and erythropoiesis.  3% blasts were seen by manual differential.   Plan is to continue Vidaza until progression or toxicity      Interval history-patient has baseline fatigue but is otherwise doing well and tolerating treatment without any significant side effects.  Denies any recurrent infections.  Weight has remained stable around 190 pounds  ECOG  PS- 1 Pain scale- 0   Review of systems- Review of Systems  Constitutional:  Positive for malaise/fatigue. Negative for chills, fever and weight loss.  HENT:  Negative for congestion, ear discharge and nosebleeds.   Eyes:  Negative for blurred vision.  Respiratory:  Negative for cough, hemoptysis, sputum production, shortness of breath and wheezing.   Cardiovascular:  Negative for chest pain, palpitations, orthopnea and claudication.  Gastrointestinal:  Negative for abdominal pain, blood in stool, constipation, diarrhea, heartburn, melena, nausea and vomiting.  Genitourinary:  Negative for dysuria, flank pain, frequency,  hematuria and urgency.  Musculoskeletal:  Negative for back pain, joint pain and myalgias.  Skin:  Negative for rash.  Neurological:  Negative for dizziness, tingling, focal weakness, seizures, weakness and headaches.  Endo/Heme/Allergies:  Does not bruise/bleed easily.  Psychiatric/Behavioral:  Negative for depression and suicidal ideas. The patient does not have insomnia.      Allergies  Allergen Reactions   Dome-Paste Bandage [Wound Dressings]     All bandaids cause a rash   Soap & Cleansers     "liquid soap" antibacterial -rash   Latex Rash and Itching     Past Medical History:  Diagnosis Date   Arthritis    "lower back" (10/24/2012)   Coronary artery disease    GERD (gastroesophageal reflux disease)    High cholesterol    Hypertension    Liver cancer ([place])    MDS (myelodysplastic syndrome) ([place])    Type II diabetes mellitus ([place])      Past Surgical History:  Procedure Laterality Date   APPENDECTOMY  2002   CARDIAC CATHETERIZATION  10/24/2012   CORONARY ARTERY BYPASS GRAFT N/A 10/26/2012   Procedure: CORONARY ARTERY BYPASS GRAFTING (CABG);  Surgeon: Melrose Nakayama, MD;  Location: [facility];  Service: Open Heart Surgery;  Laterality: N/A;  Times 3 using left internal mammary artery and endoscopically harvested right saphenous vein   ERCP N/A 04/25/2020   Procedure: ENDOSCOPIC RETROGRADE CHOLANGIOPANCREATOGRAPHY (ERCP);  Surgeon: Lucilla Lame, MD;  Location: [facility] ENDOSCOPY;  Service: Endoscopy;  Laterality: N/A;    Social History   Socioeconomic History   Marital status: Married    Spouse name: Not on file   Number of children: Not on file   Years of education: Not on file   Highest education level: Not on file  Occupational History   Not on file  Tobacco Use   Smoking status: Former    Packs/day: 0.50    Years: 30.00    Pack years: 15.00    Types: Cigarettes    Quit date: 08/28/2001    Years since quitting: 19.2   Smokeless tobacco: Never  Vaping Use    Vaping Use: Never used  Substance and Sexual Activity   Alcohol use: No    Alcohol/week: 0.0 standard drinks    Comment: 10/24/2012 "stopped drinking alcohol in 2003; used to drink ~ 9 bottles beer/wk"   Drug use: No   Sexual activity: Yes  Other Topics Concern   Not on file  Social History Narrative   Not on file   Social Determinants of Health   Financial Resource Strain: Not on file  Food Insecurity: Not on file  Transportation Needs: Not on file  Physical Activity: Not on file  Stress: Not on file  Social Connections: Not on file  Intimate Partner Violence: Not on file    Family History  Problem Relation Age of Onset   Cancer Sister      Current Outpatient Medications:  acyclovir (ZOVIRAX) 400 MG tablet, TAKE 1 TABLET BY MOUTH TWICE A DAY, Disp: 60 tablet, Rfl: 1   atorvastatin (LIPITOR) 40 MG tablet, Take by mouth., Disp: , Rfl:    glimepiride (AMARYL) 1 MG tablet, Take 1 mg by mouth daily., Disp: , Rfl:    levofloxacin (LEVAQUIN) 500 MG tablet, TAKE 1 TABLET (500 MG TOTAL) BY MOUTH DAILY., Disp: 30 tablet, Rfl: 0   metoprolol tartrate (LOPRESSOR) 25 MG tablet, Take 25 mg by mouth daily., Disp: , Rfl:    omeprazole (PRILOSEC) 40 MG capsule, TAKE 1 CAPSULE BY MOUTH TWICE A DAY, Disp: 60 capsule, Rfl: 0   senna-docusate (SENOKOT-S) 8.6-50 MG tablet, Take 1 tablet by mouth every morning., Disp: , Rfl:    traZODone (DESYREL) 50 MG tablet, TAKE 1-2 TABLETS BY MOUTH AT BEDTIME., Disp: 180 tablet, Rfl: 1   gabapentin (NEURONTIN) 300 MG capsule, Take 1 capsule (300 mg total) by mouth at bedtime. (Patient not taking: No sig reported), Disp: 30 capsule, Rfl: 0   loratadine (CLARITIN) 10 MG tablet, Take 5 mg by mouth daily as needed. Take half a tablet (42m) daily (Patient not taking: Reported on 11/17/2020), Disp: , Rfl:    LORazepam (ATIVAN) 0.5 MG tablet, Take 1 tablet (0.5 mg total) by mouth every 6 (six) hours as needed (Nausea or vomiting). (Patient not taking: No sig  reported), Disp: 30 tablet, Rfl: 0   losartan (COZAAR) 25 MG tablet, Take 25 mg by mouth daily. (Patient not taking: No sig reported), Disp: , Rfl:    nitroGLYCERIN (NITROSTAT) 0.3 MG SL tablet, nitroglycerin 0.3 mg sublingual tablet  PLACE ONE TABLET (0.3 MG DOSE) UNDER THE TONGUE EVERY 5 (FIVE) MINUTES AS NEEDED FOR CHEST PAIN. (Patient not taking: No sig reported), Disp: , Rfl:    omeprazole (PRILOSEC) 20 MG capsule, Take 40 mg by mouth every morning. (Patient not taking: No sig reported), Disp: , Rfl:    ondansetron (ZOFRAN) 8 MG tablet, Take 1 tablet (8 mg total) by mouth 2 (two) times daily as needed (Nausea or vomiting). (Patient not taking: No sig reported), Disp: 30 tablet, Rfl: 1   oxyCODONE (OXY IR/ROXICODONE) 5 MG immediate release tablet, Take 1 tablet (5 mg total) by mouth every 8 (eight) hours as needed for severe pain. (Patient not taking: Reported on 11/17/2020), Disp: 90 tablet, Rfl: 0   predniSONE (DELTASONE) 20 MG tablet, TAKE 1 TABLET BY MOUTH DAILY X 5 DAYS IN AM WITH FOOD (Patient not taking: No sig reported), Disp: , Rfl:    prochlorperazine (COMPAZINE) 10 MG tablet, Take 1 tablet (10 mg total) by mouth every 6 (six) hours as needed (Nausea or vomiting). (Patient not taking: No sig reported), Disp: 30 tablet, Rfl: 1  Physical exam:  Vitals:   11/17/20 0913  BP: 124/62  Pulse: 68  Resp: 18  Temp: 98.2 F (36.8 C)  SpO2: 100%  Weight: 192 lb (87.1 kg)   Physical Exam Constitutional:      General: He is not in acute distress. Cardiovascular:     Rate and Rhythm: Normal rate and regular rhythm.     Heart sounds: Normal heart sounds.  Pulmonary:     Effort: Pulmonary effort is normal.     Breath sounds: Normal breath sounds.  Abdominal:     General: Bowel sounds are normal.     Palpations: Abdomen is soft.  Skin:    General: Skin is warm and dry.  Neurological:     Mental Status: He is alert  and oriented to person, place, and time.     CMP Latest Ref Rng &  Units 11/17/2020  Glucose 70 - 99 mg/dL 231(H)  BUN 8 - 23 mg/dL 14  Creatinine 0.61 - 1.24 mg/dL 0.98  Sodium 135 - 145 mmol/L 138  Potassium 3.5 - 5.1 mmol/L 4.1  Chloride 98 - 111 mmol/L 101  CO2 22 - 32 mmol/L 29  Calcium 8.9 - 10.3 mg/dL 8.7(L)  Total Protein 6.5 - 8.1 g/dL 6.9  Total Bilirubin 0.3 - 1.2 mg/dL 1.6(H)  Alkaline Phos 38 - 126 U/L 60  AST 15 - 41 U/L 32  ALT 0 - 44 U/L 30   CBC Latest Ref Rng & Units 11/17/2020  WBC 4.0 - 10.5 K/uL 1.5(L)  Hemoglobin 13.0 - 17.0 g/dL 14.2  Hematocrit 39.0 - 52.0 % 40.0  Platelets 150 - 400 K/uL 89(L)    Assessment and plan- Patient is a 75 y.o. male  with high risk MDS and T p53 mutation.  He is here for on treatment assessment prior to cycle 7-day 1 of Vidaza  After starting Vidaza patient's hemoglobin improved dramatically from 6-14.His white cell count was initially better but recently he has been having more consistent neutropenia which could be secondary to IDs as well.  Given that his [place] is less than 1 today I am holding off on giving him Vidaza.  He will return to clinic in 1 week and receive a lower dose of Vidaza at 60 mg per metered square.  Given that it is a Thanksgiving week he will receive 3 days of treatment next week and then 2 days the following week.  He will be seen by covering NP 5 weeks from now for cycle 8 of 5 days and I will see him back 9 weeks from now for cycle 9.  Plan is to continue Vidaza until progression or toxicity.  He does have thrombocytopenia as well and it is unclear if it is related to MDS versus Vidaza since his platelet count improved to 132 at one-point but then following that it has been fluctuating between 50s to 90s.  As long as platelets are greater than 50 okay to receive Vidaza.  If neutrophil count remains low despite the use of Vidaza bone marrow biopsy could be considered again in the future.  History of hepatocellular carcinoma s/p thermal ablation.  He will be due for repeat MRI at  this time which we will schedule   Visit Diagnosis 1. MDS (myelodysplastic syndrome) ([place])   2. Chemotherapy induced neutropenia ([facility])      Dr. Randa Evens, MD, MPH [facility] at [facility] [phone] 11/17/2020 11:47 AM

## 2020-11-17 NOTE — Progress Notes (Signed)
Pt will like to talk about when "the end of chemo" Pt will also like to r/s to a later time on 11/17.

## 2020-11-18 ENCOUNTER — Inpatient Hospital Stay: Payer: Medicare HMO

## 2020-11-18 LAB — AFP TUMOR MARKER: AFP, Serum, Tumor Marker: 3.6 ng/mL (ref 0.0–8.4)

## 2020-11-19 ENCOUNTER — Inpatient Hospital Stay: Payer: Medicare HMO

## 2020-11-20 ENCOUNTER — Inpatient Hospital Stay: Payer: Medicare HMO

## 2020-11-20 DIAGNOSIS — D469 Myelodysplastic syndrome, unspecified: Secondary | ICD-10-CM | POA: Diagnosis not present

## 2020-11-21 ENCOUNTER — Inpatient Hospital Stay: Payer: Medicare HMO

## 2020-11-24 ENCOUNTER — Inpatient Hospital Stay: Payer: Medicare HMO

## 2020-11-24 ENCOUNTER — Other Ambulatory Visit: Payer: Self-pay

## 2020-11-24 VITALS — BP 129/64 | HR 66 | Temp 96.8°F | Resp 18 | Wt 192.0 lb

## 2020-11-24 DIAGNOSIS — Z5111 Encounter for antineoplastic chemotherapy: Secondary | ICD-10-CM | POA: Diagnosis not present

## 2020-11-24 DIAGNOSIS — K7460 Unspecified cirrhosis of liver: Secondary | ICD-10-CM | POA: Diagnosis not present

## 2020-11-24 DIAGNOSIS — Z8505 Personal history of malignant neoplasm of liver: Secondary | ICD-10-CM | POA: Diagnosis not present

## 2020-11-24 DIAGNOSIS — D701 Agranulocytosis secondary to cancer chemotherapy: Secondary | ICD-10-CM | POA: Diagnosis not present

## 2020-11-24 DIAGNOSIS — D61818 Other pancytopenia: Secondary | ICD-10-CM | POA: Diagnosis not present

## 2020-11-24 DIAGNOSIS — D469 Myelodysplastic syndrome, unspecified: Secondary | ICD-10-CM

## 2020-11-24 DIAGNOSIS — D46C Myelodysplastic syndrome with isolated del(5q) chromosomal abnormality: Secondary | ICD-10-CM | POA: Diagnosis not present

## 2020-11-24 LAB — COMPREHENSIVE METABOLIC PANEL
ALT: 34 U/L (ref 0–44)
AST: 35 U/L (ref 15–41)
Albumin: 4 g/dL (ref 3.5–5.0)
Alkaline Phosphatase: 65 U/L (ref 38–126)
Anion gap: 5 (ref 5–15)
BUN: 16 mg/dL (ref 8–23)
CO2: 29 mmol/L (ref 22–32)
Calcium: 8.2 mg/dL — ABNORMAL LOW (ref 8.9–10.3)
Chloride: 101 mmol/L (ref 98–111)
Creatinine, Ser: 1.12 mg/dL (ref 0.61–1.24)
GFR, Estimated: >60 mL/min (ref >60)
Glucose, Bld: 185 mg/dL — ABNORMAL HIGH (ref 70–99)
Potassium: 4.4 mmol/L (ref 3.5–5.1)
Sodium: 135 mmol/L (ref 135–145)
Total Bilirubin: 1 mg/dL (ref 0.3–1.2)
Total Protein: 6.6 g/dL (ref 6.5–8.1)

## 2020-11-24 LAB — CBC WITH DIFFERENTIAL/PLATELET
Abs Immature Granulocytes: 0.02 10*3/uL (ref 0.00–0.07)
Basophils Absolute: 0 10*3/uL (ref 0.0–0.1)
Basophils Relative: 2 %
Eosinophils Absolute: 0.1 10*3/uL (ref 0.0–0.5)
Eosinophils Relative: 3 %
HCT: 41.3 % (ref 39.0–52.0)
Hemoglobin: 14.4 g/dL (ref 13.0–17.0)
Immature Granulocytes: 1 %
Lymphocytes Relative: 44 %
Lymphs Abs: 1.2 10*3/uL (ref 0.7–4.0)
MCH: 34.7 pg — ABNORMAL HIGH (ref 26.0–34.0)
MCHC: 34.9 g/dL (ref 30.0–36.0)
MCV: 99.5 fL (ref 80.0–100.0)
Monocytes Absolute: 0.3 10*3/uL (ref 0.1–1.0)
Monocytes Relative: 10 %
Neutro Abs: 1.1 10*3/uL — ABNORMAL LOW (ref 1.7–7.7)
Neutrophils Relative %: 40 %
Platelets: 80 10*3/uL — ABNORMAL LOW (ref 150–400)
RBC: 4.15 MIL/uL — ABNORMAL LOW (ref 4.22–5.81)
RDW: 14.5 % (ref 11.5–15.5)
WBC: 2.7 10*3/uL — ABNORMAL LOW (ref 4.0–10.5)
nRBC: 0 % (ref 0.0–0.2)

## 2020-11-24 MED ORDER — ONDANSETRON HCL 4 MG PO TABS
8.0000 mg | ORAL_TABLET | Freq: Once | ORAL | Status: AC
  Administered 2020-11-24: 8 mg via ORAL
  Filled 2020-11-24: qty 2

## 2020-11-24 MED ORDER — AZACITIDINE CHEMO SQ INJECTION
60.0000 mg/m2 | Freq: Once | INTRAMUSCULAR | Status: AC
  Administered 2020-11-24: 130 mg via SUBCUTANEOUS
  Filled 2020-11-24: qty 5.2

## 2020-11-24 NOTE — Progress Notes (Signed)
Per MD ok to use CMP from 1 week ago.

## 2020-11-24 NOTE — Progress Notes (Signed)
Ok per Dr Janese Banks to tx with anc 1.1 and plt 80.

## 2020-11-24 NOTE — Patient Instructions (Addendum)
[facility] ONCOL[facility]  Discharge Instructions: Thank you for choosing [facility] to provide your oncology and hematology care.  If you have a lab appointment with the [facility], please go directly to the [facility] and check in at the registration area.  Wear comfortable clothing and clothing appropriate for easy access to any Portacath or PICC line.   We strive to give you quality time with your provider. You may need to reschedule your appointment if you arrive late (15 or more minutes).  Arriving late affects you and other patients whose appointments are after yours.  Also, if you miss three or more appointments without notifying the office, you may be dismissed from the clinic at the provider's discretion.      For prescription refill requests, have your pharmacy contact our office and allow 72 hours for refills to be completed.    Today you received the following chemotherapy and/or immunotherapy agents : Vidaza   To help prevent nausea and vomiting after your treatment, we encourage you to take your nausea medication as directed.  BELOW ARE SYMPTOMS THAT SHOULD BE REPORTED IMMEDIATELY: *FEVER GREATER THAN 100.4 F (38 C) OR HIGHER *CHILLS OR SWEATING *NAUSEA AND VOMITING THAT IS NOT CONTROLLED WITH YOUR NAUSEA MEDICATION *UNUSUAL SHORTNESS OF BREATH *UNUSUAL BRUISING OR BLEEDING *URINARY PROBLEMS (pain or burning when urinating, or frequent urination) *BOWEL PROBLEMS (unusual diarrhea, constipation, pain near the anus) TENDERNESS IN MOUTH AND THROAT WITH OR WITHOUT PRESENCE OF ULCERS (sore throat, sores in mouth, or a toothache) UNUSUAL RASH, SWELLING OR PAIN  UNUSUAL VAGINAL DISCHARGE OR ITCHING   Items with * indicate a potential emergency and should be followed up as soon as possible or go to the Emergency Department if any problems should occur.  Please show the CHEMOTHERAPY ALERT CARD or IMMUNOTHERAPY ALERT CARD at check-in to  the Emergency Department and triage nurse.  Should you have questions after your visit or need to cancel or reschedule your appointment, please contact [facility]  [phone] and follow the prompts.  Office hours are 8:00 a.m. to 4:30 p.m. Monday - Friday. Please note that voicemails left after 4:00 p.m. may not be returned until the following business day.  We are closed weekends and major holidays. You have access to a nurse at all times for urgent questions. Please call the main number to the clinic [phone] and follow the prompts.  For any non-urgent questions, you may also contact your provider using MyChart. We now offer e-Visits for anyone 66 and older to request care online for non-urgent symptoms. For details visit [email or web address].   Also download the MyChart app! Go to the app store, search "MyChart", open the app, select [facility], and log in with your MyChart username and password.  Due to Covid, a mask is required upon entering the hospital/clinic. If you do not have a mask, one will be given to you upon arrival. For doctor visits, patients may have 1 support person aged 68 or older with them. For treatment visits, patients cannot have anyone with them due to current Covid guidelines and our immunocompromised population.

## 2020-11-25 ENCOUNTER — Inpatient Hospital Stay: Payer: Medicare HMO

## 2020-11-25 VITALS — BP 123/54 | HR 56 | Temp 99.0°F | Resp 18

## 2020-11-25 DIAGNOSIS — D701 Agranulocytosis secondary to cancer chemotherapy: Secondary | ICD-10-CM | POA: Diagnosis not present

## 2020-11-25 DIAGNOSIS — D46C Myelodysplastic syndrome with isolated del(5q) chromosomal abnormality: Secondary | ICD-10-CM | POA: Diagnosis not present

## 2020-11-25 DIAGNOSIS — D469 Myelodysplastic syndrome, unspecified: Secondary | ICD-10-CM

## 2020-11-25 DIAGNOSIS — D61818 Other pancytopenia: Secondary | ICD-10-CM | POA: Diagnosis not present

## 2020-11-25 DIAGNOSIS — K7460 Unspecified cirrhosis of liver: Secondary | ICD-10-CM | POA: Diagnosis not present

## 2020-11-25 DIAGNOSIS — Z8505 Personal history of malignant neoplasm of liver: Secondary | ICD-10-CM | POA: Diagnosis not present

## 2020-11-25 DIAGNOSIS — Z5111 Encounter for antineoplastic chemotherapy: Secondary | ICD-10-CM | POA: Diagnosis not present

## 2020-11-25 MED ORDER — AZACITIDINE CHEMO SQ INJECTION
60.0000 mg/m2 | Freq: Once | INTRAMUSCULAR | Status: AC
  Administered 2020-11-25: 130 mg via SUBCUTANEOUS
  Filled 2020-11-25: qty 5.2

## 2020-11-25 MED ORDER — ONDANSETRON HCL 4 MG PO TABS
8.0000 mg | ORAL_TABLET | Freq: Once | ORAL | Status: AC
  Administered 2020-11-25: 8 mg via ORAL
  Filled 2020-11-25: qty 2

## 2020-11-26 ENCOUNTER — Inpatient Hospital Stay: Payer: Medicare HMO

## 2020-11-26 ENCOUNTER — Other Ambulatory Visit: Payer: Self-pay

## 2020-11-26 VITALS — BP 136/56 | HR 66 | Temp 96.8°F | Resp 18

## 2020-11-26 DIAGNOSIS — K7460 Unspecified cirrhosis of liver: Secondary | ICD-10-CM | POA: Diagnosis not present

## 2020-11-26 DIAGNOSIS — D46C Myelodysplastic syndrome with isolated del(5q) chromosomal abnormality: Secondary | ICD-10-CM | POA: Diagnosis not present

## 2020-11-26 DIAGNOSIS — D701 Agranulocytosis secondary to cancer chemotherapy: Secondary | ICD-10-CM | POA: Diagnosis not present

## 2020-11-26 DIAGNOSIS — D61818 Other pancytopenia: Secondary | ICD-10-CM | POA: Diagnosis not present

## 2020-11-26 DIAGNOSIS — Z8505 Personal history of malignant neoplasm of liver: Secondary | ICD-10-CM | POA: Diagnosis not present

## 2020-11-26 DIAGNOSIS — Z5111 Encounter for antineoplastic chemotherapy: Secondary | ICD-10-CM | POA: Diagnosis not present

## 2020-11-26 DIAGNOSIS — D469 Myelodysplastic syndrome, unspecified: Secondary | ICD-10-CM

## 2020-11-26 MED ORDER — AZACITIDINE CHEMO SQ INJECTION
60.0000 mg/m2 | Freq: Once | INTRAMUSCULAR | Status: AC
  Administered 2020-11-26: 130 mg via SUBCUTANEOUS
  Filled 2020-11-26: qty 5.2

## 2020-11-26 MED ORDER — ONDANSETRON HCL 4 MG PO TABS
8.0000 mg | ORAL_TABLET | Freq: Once | ORAL | Status: AC
  Administered 2020-11-26: 8 mg via ORAL
  Filled 2020-11-26: qty 2

## 2020-11-26 NOTE — Patient Instructions (Signed)
[facility]  Discharge Instructions: Thank you for choosing [facility] to provide your oncology and hematology care.  If you have a lab appointment with the [facility], please go directly to the [facility] and check in at the registration area.  Wear comfortable clothing and clothing appropriate for easy access to any Portacath or PICC line.   We strive to give you quality time with your provider. You may need to reschedule your appointment if you arrive late (15 or more minutes).  Arriving late affects you and other patients whose appointments are after yours.  Also, if you miss three or more appointments without notifying the office, you may be dismissed from the clinic at the provider's discretion.      For prescription refill requests, have your pharmacy contact our office and allow 72 hours for refills to be completed.    Today you received the following chemotherapy and/or immunotherapy agents VIDAZA      To help prevent nausea and vomiting after your treatment, we encourage you to take your nausea medication as directed.  BELOW ARE SYMPTOMS THAT SHOULD BE REPORTED IMMEDIATELY: *FEVER GREATER THAN 100.4 F (38 C) OR HIGHER *CHILLS OR SWEATING *NAUSEA AND VOMITING THAT IS NOT CONTROLLED WITH YOUR NAUSEA MEDICATION *UNUSUAL SHORTNESS OF BREATH *UNUSUAL BRUISING OR BLEEDING *URINARY PROBLEMS (pain or burning when urinating, or frequent urination) *BOWEL PROBLEMS (unusual diarrhea, constipation, pain near the anus) TENDERNESS IN MOUTH AND THROAT WITH OR WITHOUT PRESENCE OF ULCERS (sore throat, sores in mouth, or a toothache) UNUSUAL RASH, SWELLING OR PAIN  UNUSUAL VAGINAL DISCHARGE OR ITCHING   Items with * indicate a potential emergency and should be followed up as soon as possible or go to the Emergency Department if any problems should occur.  Please show the CHEMOTHERAPY ALERT CARD or IMMUNOTHERAPY ALERT CARD at check-in to  the Emergency Department and triage nurse.  Should you have questions after your visit or need to cancel or reschedule your appointment, please contact [facility]  [phone] and follow the prompts.  Office hours are 8:00 a.m. to 4:30 p.m. Monday - Friday. Please note that voicemails left after 4:00 p.m. may not be returned until the following business day.  We are closed weekends and major holidays. You have access to a nurse at all times for urgent questions. Please call the main number to the clinic [phone] and follow the prompts.  For any non-urgent questions, you may also contact your provider using MyChart. We now offer e-Visits for anyone 39 and older to request care online for non-urgent symptoms. For details visit [email or web address].   Also download the MyChart app! Go to the app store, search "MyChart", open the app, select [facility], and log in with your MyChart username and password.  Due to Covid, a mask is required upon entering the hospital/clinic. If you do not have a mask, one will be given to you upon arrival. For doctor visits, patients may have 1 support person aged 78 or older with them. For treatment visits, patients cannot have anyone with them due to current Covid guidelines and our immunocompromised population.   Azacitidine suspension for injection (subcutaneous use) What is this medication? AZACITIDINE (ay Lucan) is a chemotherapy drug. This medicine reduces the growth of cancer cells and can suppress the immune system. It is used for treating myelodysplastic syndrome or some types of leukemia. This medicine may be used for other purposes; ask  your health care provider or pharmacist if you have questions. COMMON BRAND NAME(S): Vidaza What should I tell my care team before I take this medication? They need to know if you have any of these conditions: kidney disease liver disease liver tumors an unusual or  allergic reaction to azacitidine, mannitol, other medicines, foods, dyes, or preservatives pregnant or trying to get pregnant breast-feeding How should I use this medication? This medicine is for injection under the skin. It is administered in a hospital or clinic by a specially trained health care professional. Talk to your pediatrician regarding the use of this medicine in children. While this drug may be prescribed for selected conditions, precautions do apply. Overdosage: If you think you have taken too much of this medicine contact a poison control center or emergency room at once. NOTE: This medicine is only for you. Do not share this medicine with others. What if I miss a dose? It is important not to miss your dose. Call your doctor or health care professional if you are unable to keep an appointment. What may interact with this medication? Interactions have not been studied. Give your health care provider a list of all the medicines, herbs, non-prescription drugs, or dietary supplements you use. Also tell them if you smoke, drink alcohol, or use illegal drugs. Some items may interact with your medicine. This list may not describe all possible interactions. Give your health care provider a list of all the medicines, herbs, non-prescription drugs, or dietary supplements you use. Also tell them if you smoke, drink alcohol, or use illegal drugs. Some items may interact with your medicine. What should I watch for while using this medication? Visit your doctor for checks on your progress. This drug may make you feel generally unwell. This is not uncommon, as chemotherapy can affect healthy cells as well as cancer cells. Report any side effects. Continue your course of treatment even though you feel ill unless your doctor tells you to stop. In some cases, you may be given additional medicines to help with side effects. Follow all directions for their use. Call your doctor or health care  professional for advice if you get a fever, chills or sore throat, or other symptoms of a cold or flu. Do not treat yourself. This drug decreases your body's ability to fight infections. Try to avoid being around people who are sick. This medicine may increase your risk to bruise or bleed. Call your doctor or health care professional if you notice any unusual bleeding. You may need blood work done while you are taking this medicine. Do not become pregnant while taking this medicine and for 6 months after the last dose. Women should inform their doctor if they wish to become pregnant or think they might be pregnant. Men should not father a child while taking this medicine and for 3 months after the last dose. There is a potential for serious side effects to an unborn child. Talk to your health care professional or pharmacist for more information. Do not breast-feed an infant while taking this medicine and for 1 week after the last dose. This medicine may interfere with the ability to have a child. Talk with your doctor or health care professional if you are concerned about your fertility. What side effects may I notice from receiving this medication? Side effects that you should report to your doctor or health care professional as soon as possible: allergic reactions like skin rash, itching or hives, swelling of the face, lips,  or tongue low blood counts - this medicine may decrease the number of white blood cells, red blood cells and platelets. You may be at increased risk for infections and bleeding. signs of infection - fever or chills, cough, sore throat, pain passing urine signs of decreased platelets or bleeding - bruising, pinpoint red spots on the skin, black, tarry stools, blood in the urine signs of decreased red blood cells - unusually weak or tired, fainting spells, lightheadedness signs and symptoms of kidney injury like trouble passing urine or change in the amount of urine signs and  symptoms of liver injury like dark yellow or brown urine; general ill feeling or flu-like symptoms; light-colored stools; loss of appetite; nausea; right upper belly pain; unusually weak or tired; yellowing of the eyes or skin Side effects that usually do not require medical attention (report to your doctor or health care professional if they continue or are bothersome): constipation diarrhea nausea, vomiting pain or redness at the injection site unusually weak or tired This list may not describe all possible side effects. Call your doctor for medical advice about side effects. You may report side effects to FDA at 1-800-FDA-1088. Where should I keep my medication? This drug is given in a hospital or clinic and will not be stored at home. NOTE: This sheet is a summary. It may not cover all possible information. If you have questions about this medicine, talk to your doctor, pharmacist, or health care provider.  2022 [employer]vier/Gold Standard (2016-01-21 00:00:00)

## 2020-11-30 ENCOUNTER — Other Ambulatory Visit: Payer: Self-pay | Admitting: Oncology

## 2020-12-01 ENCOUNTER — Inpatient Hospital Stay: Payer: Medicare HMO

## 2020-12-01 ENCOUNTER — Other Ambulatory Visit: Payer: Self-pay

## 2020-12-01 VITALS — BP 122/61 | HR 79 | Temp 99.5°F | Resp 18

## 2020-12-01 DIAGNOSIS — D701 Agranulocytosis secondary to cancer chemotherapy: Secondary | ICD-10-CM | POA: Diagnosis not present

## 2020-12-01 DIAGNOSIS — K7460 Unspecified cirrhosis of liver: Secondary | ICD-10-CM | POA: Diagnosis not present

## 2020-12-01 DIAGNOSIS — D61818 Other pancytopenia: Secondary | ICD-10-CM | POA: Diagnosis not present

## 2020-12-01 DIAGNOSIS — Z5111 Encounter for antineoplastic chemotherapy: Secondary | ICD-10-CM | POA: Diagnosis not present

## 2020-12-01 DIAGNOSIS — D469 Myelodysplastic syndrome, unspecified: Secondary | ICD-10-CM

## 2020-12-01 DIAGNOSIS — D46C Myelodysplastic syndrome with isolated del(5q) chromosomal abnormality: Secondary | ICD-10-CM | POA: Diagnosis not present

## 2020-12-01 DIAGNOSIS — Z8505 Personal history of malignant neoplasm of liver: Secondary | ICD-10-CM | POA: Diagnosis not present

## 2020-12-01 MED ORDER — ONDANSETRON HCL 4 MG PO TABS
8.0000 mg | ORAL_TABLET | Freq: Once | ORAL | Status: AC
  Administered 2020-12-01: 14:00:00 8 mg via ORAL
  Filled 2020-12-01: qty 2

## 2020-12-01 MED ORDER — AZACITIDINE CHEMO SQ INJECTION
60.0000 mg/m2 | Freq: Once | INTRAMUSCULAR | Status: AC
  Administered 2020-12-01: 15:00:00 130 mg via SUBCUTANEOUS
  Filled 2020-12-01: qty 5.2

## 2020-12-02 ENCOUNTER — Inpatient Hospital Stay: Payer: Medicare HMO

## 2020-12-02 VITALS — BP 117/61 | HR 62 | Temp 96.3°F | Resp 18

## 2020-12-02 DIAGNOSIS — Z8505 Personal history of malignant neoplasm of liver: Secondary | ICD-10-CM | POA: Diagnosis not present

## 2020-12-02 DIAGNOSIS — D46C Myelodysplastic syndrome with isolated del(5q) chromosomal abnormality: Secondary | ICD-10-CM | POA: Diagnosis not present

## 2020-12-02 DIAGNOSIS — Z5111 Encounter for antineoplastic chemotherapy: Secondary | ICD-10-CM | POA: Diagnosis not present

## 2020-12-02 DIAGNOSIS — D61818 Other pancytopenia: Secondary | ICD-10-CM | POA: Diagnosis not present

## 2020-12-02 DIAGNOSIS — D469 Myelodysplastic syndrome, unspecified: Secondary | ICD-10-CM

## 2020-12-02 DIAGNOSIS — D701 Agranulocytosis secondary to cancer chemotherapy: Secondary | ICD-10-CM | POA: Diagnosis not present

## 2020-12-02 DIAGNOSIS — K7460 Unspecified cirrhosis of liver: Secondary | ICD-10-CM | POA: Diagnosis not present

## 2020-12-02 MED ORDER — ONDANSETRON HCL 4 MG PO TABS
8.0000 mg | ORAL_TABLET | Freq: Once | ORAL | Status: AC
  Administered 2020-12-02: 8 mg via ORAL

## 2020-12-02 MED ORDER — AZACITIDINE CHEMO SQ INJECTION
60.0000 mg/m2 | Freq: Once | INTRAMUSCULAR | Status: AC
  Administered 2020-12-02: 130 mg via SUBCUTANEOUS
  Filled 2020-12-02: qty 5.2

## 2020-12-02 NOTE — Patient Instructions (Signed)
[facility] ONCOLOGY  Discharge Instructions: Thank you for choosing [facility] to provide your oncology and hematology care.  If you have a lab appointment with the [facility], please go directly to the [facility] and check in at the registration area.  Wear comfortable clothing and clothing appropriate for easy access to any Portacath or PICC line.   We strive to give you quality time with your provider. You may need to reschedule your appointment if you arrive late (15 or more minutes).  Arriving late affects you and other patients whose appointments are after yours.  Also, if you miss three or more appointments without notifying the office, you may be dismissed from the clinic at the provider's discretion.      For prescription refill requests, have your pharmacy contact our office and allow 72 hours for refills to be completed.    Today you received the following chemotherapy and/or immunotherapy agents : Vidaza   To help prevent nausea and vomiting after your treatment, we encourage you to take your nausea medication as directed.  BELOW ARE SYMPTOMS THAT SHOULD BE REPORTED IMMEDIATELY: *FEVER GREATER THAN 100.4 F (38 C) OR HIGHER *CHILLS OR SWEATING *NAUSEA AND VOMITING THAT IS NOT CONTROLLED WITH YOUR NAUSEA MEDICATION *UNUSUAL SHORTNESS OF BREATH *UNUSUAL BRUISING OR BLEEDING *URINARY PROBLEMS (pain or burning when urinating, or frequent urination) *BOWEL PROBLEMS (unusual diarrhea, constipation, pain near the anus) TENDERNESS IN MOUTH AND THROAT WITH OR WITHOUT PRESENCE OF ULCERS (sore throat, sores in mouth, or a toothache) UNUSUAL RASH, SWELLING OR PAIN  UNUSUAL VAGINAL DISCHARGE OR ITCHING   Items with * indicate a potential emergency and should be followed up as soon as possible or go to the Emergency Department if any problems should occur.  Please show the CHEMOTHERAPY ALERT CARD or IMMUNOTHERAPY ALERT CARD at check-in to  the Emergency Department and triage nurse.  Should you have questions after your visit or need to cancel or reschedule your appointment, please contact [facility]  [phone] and follow the prompts.  Office hours are 8:00 a.m. to 4:30 p.m. Monday - Friday. Please note that voicemails left after 4:00 p.m. may not be returned until the following business day.  We are closed weekends and major holidays. You have access to a nurse at all times for urgent questions. Please call the main number to the clinic [phone] and follow the prompts.  For any non-urgent questions, you may also contact your provider using MyChart. We now offer e-Visits for anyone 85 and older to request care online for non-urgent symptoms. For details visit [email or web address].   Also download the MyChart app! Go to the app store, search "MyChart", open the app, select [facility], and log in with your MyChart username and password.  Due to Covid, a mask is required upon entering the hospital/clinic. If you do not have a mask, one will be given to you upon arrival. For doctor visits, patients may have 1 support person aged 71 or older with them. For treatment visits, patients cannot have anyone with them due to current Covid guidelines and our immunocompromised population.

## 2020-12-09 ENCOUNTER — Other Ambulatory Visit: Payer: Self-pay | Admitting: Oncology

## 2020-12-09 DIAGNOSIS — D469 Myelodysplastic syndrome, unspecified: Secondary | ICD-10-CM

## 2020-12-22 ENCOUNTER — Encounter: Payer: Self-pay | Admitting: Nurse Practitioner

## 2020-12-22 ENCOUNTER — Inpatient Hospital Stay: Payer: Medicare HMO | Attending: Nurse Practitioner

## 2020-12-22 ENCOUNTER — Inpatient Hospital Stay: Payer: Medicare HMO | Admitting: Nurse Practitioner

## 2020-12-22 ENCOUNTER — Other Ambulatory Visit: Payer: Self-pay

## 2020-12-22 ENCOUNTER — Inpatient Hospital Stay: Payer: Medicare HMO

## 2020-12-22 VITALS — BP 127/67 | HR 75 | Temp 98.9°F | Resp 16 | Ht 72.0 in | Wt 194.0 lb

## 2020-12-22 DIAGNOSIS — D696 Thrombocytopenia, unspecified: Secondary | ICD-10-CM

## 2020-12-22 DIAGNOSIS — D469 Myelodysplastic syndrome, unspecified: Secondary | ICD-10-CM

## 2020-12-22 DIAGNOSIS — D709 Neutropenia, unspecified: Secondary | ICD-10-CM | POA: Insufficient documentation

## 2020-12-22 DIAGNOSIS — D46C Myelodysplastic syndrome with isolated del(5q) chromosomal abnormality: Secondary | ICD-10-CM | POA: Insufficient documentation

## 2020-12-22 DIAGNOSIS — Z5111 Encounter for antineoplastic chemotherapy: Secondary | ICD-10-CM

## 2020-12-22 DIAGNOSIS — Z8505 Personal history of malignant neoplasm of liver: Secondary | ICD-10-CM | POA: Insufficient documentation

## 2020-12-22 LAB — COMPREHENSIVE METABOLIC PANEL
ALT: 32 U/L (ref 0–44)
AST: 30 U/L (ref 15–41)
Albumin: 4.2 g/dL (ref 3.5–5.0)
Alkaline Phosphatase: 63 U/L (ref 38–126)
Anion gap: 7 (ref 5–15)
BUN: 16 mg/dL (ref 8–23)
CO2: 27 mmol/L (ref 22–32)
Calcium: 9 mg/dL (ref 8.9–10.3)
Chloride: 102 mmol/L (ref 98–111)
Creatinine, Ser: 1.04 mg/dL (ref 0.61–1.24)
GFR, Estimated: >60 mL/min (ref >60)
Glucose, Bld: 249 mg/dL — ABNORMAL HIGH (ref 70–99)
Potassium: 4 mmol/L (ref 3.5–5.1)
Sodium: 136 mmol/L (ref 135–145)
Total Bilirubin: 1.4 mg/dL — ABNORMAL HIGH (ref 0.3–1.2)
Total Protein: 6.9 g/dL (ref 6.5–8.1)

## 2020-12-22 LAB — CBC WITH DIFFERENTIAL/PLATELET
Abs Immature Granulocytes: 0.01 10*3/uL (ref 0.00–0.07)
Basophils Absolute: 0.1 10*3/uL (ref 0.0–0.1)
Basophils Relative: 3 %
Eosinophils Absolute: 0.1 10*3/uL (ref 0.0–0.5)
Eosinophils Relative: 2 %
HCT: 40 % (ref 39.0–52.0)
Hemoglobin: 14.2 g/dL (ref 13.0–17.0)
Immature Granulocytes: 0 %
Lymphocytes Relative: 32 %
Lymphs Abs: 0.8 10*3/uL (ref 0.7–4.0)
MCH: 35.6 pg — ABNORMAL HIGH (ref 26.0–34.0)
MCHC: 35.5 g/dL (ref 30.0–36.0)
MCV: 100.3 fL — ABNORMAL HIGH (ref 80.0–100.0)
Monocytes Absolute: 0.1 10*3/uL (ref 0.1–1.0)
Monocytes Relative: 4 %
Neutro Abs: 1.5 10*3/uL — ABNORMAL LOW (ref 1.7–7.7)
Neutrophils Relative %: 59 %
Platelets: 54 10*3/uL — ABNORMAL LOW (ref 150–400)
RBC: 3.99 MIL/uL — ABNORMAL LOW (ref 4.22–5.81)
RDW: 14.9 % (ref 11.5–15.5)
WBC: 2.5 10*3/uL — ABNORMAL LOW (ref 4.0–10.5)
nRBC: 0 % (ref 0.0–0.2)

## 2020-12-22 MED ORDER — AZACITIDINE CHEMO SQ INJECTION
60.0000 mg/m2 | Freq: Once | INTRAMUSCULAR | Status: AC
  Administered 2020-12-22: 11:00:00 130 mg via SUBCUTANEOUS
  Filled 2020-12-22: qty 5.2

## 2020-12-22 MED ORDER — ONDANSETRON HCL 4 MG PO TABS
8.0000 mg | ORAL_TABLET | Freq: Once | ORAL | Status: AC
  Administered 2020-12-22: 11:00:00 8 mg via ORAL
  Filled 2020-12-22: qty 2

## 2020-12-22 NOTE — Progress Notes (Signed)
Ok to Tx per Beckey Rutter NP with plts 54.

## 2020-12-22 NOTE — Patient Instructions (Signed)
[facility] AT [place]  Discharge Instructions: Thank you for choosing [facility] to provide your oncology and hematology care.  If you have a lab appointment with the [facility], please go directly to the [facility] and check in at the registration area.  Wear comfortable clothing and clothing appropriate for easy access to any Portacath or PICC line.   We strive to give you quality time with your provider. You may need to reschedule your appointment if you arrive late (15 or more minutes).  Arriving late affects you and other patients whose appointments are after yours.  Also, if you miss three or more appointments without notifying the office, you may be dismissed from the clinic at the providers discretion.      For prescription refill requests, have your pharmacy contact our office and allow 72 hours for refills to be completed.    Today you received the following chemotherapy and/or immunotherapy agents : Vidaza  To help prevent nausea and vomiting after your treatment, we encourage you to take your nausea medication as directed.  BELOW ARE SYMPTOMS THAT SHOULD BE REPORTED IMMEDIATELY: *FEVER GREATER THAN 100.4 F (38 C) OR HIGHER *CHILLS OR SWEATING *NAUSEA AND VOMITING THAT IS NOT CONTROLLED WITH YOUR NAUSEA MEDICATION *UNUSUAL SHORTNESS OF BREATH *UNUSUAL BRUISING OR BLEEDING *URINARY PROBLEMS (pain or burning when urinating, or frequent urination) *BOWEL PROBLEMS (unusual diarrhea, constipation, pain near the anus) TENDERNESS IN MOUTH AND THROAT WITH OR WITHOUT PRESENCE OF ULCERS (sore throat, sores in mouth, or a toothache) UNUSUAL RASH, SWELLING OR PAIN  UNUSUAL VAGINAL DISCHARGE OR ITCHING   Items with * indicate a potential emergency and should be followed up as soon as possible or go to the Emergency Department if any problems should occur.  Please show the CHEMOTHERAPY ALERT CARD or IMMUNOTHERAPY ALERT CARD at check-in to the  Emergency Department and triage nurse.  Should you have questions after your visit or need to cancel or reschedule your appointment, please contact [facility] [place] AT [place]  [phone] and follow the prompts.  Office hours are 8:00 a.m. to 4:30 p.m. Monday - Friday. Please note that voicemails left after 4:00 p.m. may not be returned until the following business day.  We are closed weekends and major holidays. You have access to a nurse at all times for urgent questions. Please call the main number to the clinic [phone] and follow the prompts.  For any non-urgent questions, you may also contact your provider using MyChart. We now offer e-Visits for anyone 27 and older to request care online for non-urgent symptoms. For details visit [email or web address].   Also download the MyChart app! Go to the app store, search "MyChart", open the app, select [facility], and log in with your MyChart username and password.  Due to Covid, a mask is required upon entering the hospital/clinic. If you do not have a mask, one will be given to you upon arrival. For doctor visits, patients may have 1 support person aged 42 or older with them. For treatment visits, patients cannot have anyone with them due to current Covid guidelines and our immunocompromised population.

## 2020-12-22 NOTE — Progress Notes (Signed)
Has left hip pain that he would like to get a steroid injection from his orthopedic doctor. Wants to make sure that is ok

## 2020-12-22 NOTE — Progress Notes (Signed)
Hematology/Oncology Consult Note [facility]  Telephone:([phone] [phone]  Patient Care Team: Danelle Berry, NP as PCP - General (Nurse Practitioner) Dionisio David, MD (Cardiology) Sindy Guadeloupe, MD as Consulting Physician (Hematology and Oncology)   Name of the patient: Timothy Murray  [id number]  [birth year]   Date of visit: 12/22/20  Diagnosis-  MDS with 5q del but high risk given TP53 mutation 2. [place] s/p thermal ablation  Chief complaint/ Reason for visit-on treatment assessment prior to cycle 8-day 1 of Vidaza  Heme/Onc history: Patient is a 75 year old male with a history of cirrhosis and hepatocellular carcinoma for which she had proton beam radiation In June 2021.  He has baseline thrombocytopenia which was initially attributed to his cirrhosis.  He was found to have progressive pancytopenia with a steady decline in his white cell count since March 2022 when it was down to 3.4 with a platelet count of 53.  He was hospitalized sometime in April 2022 with acute biliary gallstone pancreatitis .  He underwent ERCP inpatient with platelet transfusion which showed sludge and biliary sphincterotomy was performed.  His bilirubin which was elevated up to 7.2 has come down to 2.4 presently.   However his pancytopenia has continued to worsen and presently white count is 1.1 with an H&H of 6.2/17.6 with an MCV of 102 and a platelet count of 33.Bone marrow biopsy showed evidence of dyspoietic changes in the erythroid and megakaryocyte excel lines associated with ring sideroblasts.  Definite increase in blastic cells not identified.  Overall features favor myelodysplastic syndrome.  Karyotype was normal but only for metaphase cells were analyzed which were not sufficient.  [facility] FISH MDS panel showed deletion of 5 q. along with monosomy 7 and deletion 7 q. without excess blasts.  SF3B1 mutation negative.   Peripheral blood intellegin myeloid panel  showed :   Detected     Change       Frequency (%) Impact  TP53   c.716A>C     p.Asn239Thr  19            Tier I  U2AF1  c.470A>C     p.Gln157Pro  17            Tier I  IDH1   c.315C>T     p.Gly105Gly  40            Tier II      With regards to his hepatocellular carcinoma patient had an MRI abdomen on 04/24/2020 which showed that his primary liver mass which was previously 4.3 x 3.6 cmWas similar in size at 4.5 x 3.5 cm.    Patient was briefly on Revlimid for 5 q. deletion but after T p53 came back he was switched to [place] for 5 days every 28 days starting 05/27/2020    Patient was seen by Dr. Marvel Plan at [facility] for second opinion and he also had a repeat bone marrow biopsyDone on 08/14/2020.  Biopsy showed normocellular bone marrow with erythroid predominant trilineage hematopoiesis.  Megakaryocytes were typical in number with few hypolobated forms.  Full sequence of orderly maturation noted in both granulopoiesis and erythropoiesis.  3% blasts were seen by manual differential.   Plan is to continue Vidaza until progression or toxicity      Interval history- Patient returns to clinic for consideration of cycle 8 of vidaza. He complains of hip pain and is awaiting evaluation with ortho. Otherwise he feels well and has tolerated treatment well. Weight is  stable. No fevers, chills, recurrent infections. No other complaints today.   ECOG PS- 1 Pain scale- 3- left hip   Review of systems- Review of Systems  Constitutional:  Negative for chills, fever, malaise/fatigue and weight loss.  HENT:  Negative for congestion, ear discharge and nosebleeds.   Eyes:  Negative for blurred vision.  Respiratory:  Negative for cough, hemoptysis, sputum production, shortness of breath and wheezing.   Cardiovascular:  Negative for chest pain, palpitations, orthopnea and claudication.  Gastrointestinal:  Negative for abdominal pain, blood in stool, constipation, diarrhea, heartburn, melena, nausea and vomiting.   Genitourinary:  Negative for dysuria, flank pain, frequency, hematuria and urgency.  Musculoskeletal:  Positive for joint pain. Negative for back pain and myalgias.  Skin:  Negative for rash.  Neurological:  Negative for dizziness, tingling, focal weakness, seizures, weakness and headaches.  Endo/Heme/Allergies:  Does not bruise/bleed easily.  Psychiatric/Behavioral:  Negative for depression and suicidal ideas. The patient does not have insomnia.      Allergies  Allergen Reactions   Dome-Paste Bandage [Wound Dressings]     All bandaids cause a rash   Soap & Cleansers     "liquid soap" antibacterial -rash   Latex Rash and Itching     Past Medical History:  Diagnosis Date   Arthritis    "lower back" (10/24/2012)   Coronary artery disease    GERD (gastroesophageal reflux disease)    High cholesterol    Hypertension    Liver cancer ([place])    MDS (myelodysplastic syndrome) ([place])    Type II diabetes mellitus ([place])      Past Surgical History:  Procedure Laterality Date   APPENDECTOMY  2002   CARDIAC CATHETERIZATION  10/24/2012   CORONARY ARTERY BYPASS GRAFT N/A 10/26/2012   Procedure: CORONARY ARTERY BYPASS GRAFTING (CABG);  Surgeon: Melrose Nakayama, MD;  Location: [facility];  Service: Open Heart Surgery;  Laterality: N/A;  Times 3 using left internal mammary artery and endoscopically harvested right saphenous vein   ERCP N/A 04/25/2020   Procedure: ENDOSCOPIC RETROGRADE CHOLANGIOPANCREATOGRAPHY (ERCP);  Surgeon: Lucilla Lame, MD;  Location: [facility] ENDOSCOPY;  Service: Endoscopy;  Laterality: N/A;    Social History   Socioeconomic History   Marital status: Married    Spouse name: Not on file   Number of children: Not on file   Years of education: Not on file   Highest education level: Not on file  Occupational History   Not on file  Tobacco Use   Smoking status: Former    Packs/day: 0.50    Years: 30.00    Pack years: 15.00    Types: Cigarettes    Quit date:  08/28/2001    Years since quitting: 19.3   Smokeless tobacco: Never  Vaping Use   Vaping Use: Never used  Substance and Sexual Activity   Alcohol use: No    Alcohol/week: 0.0 standard drinks    Comment: 10/24/2012 "stopped drinking alcohol in 2003; used to drink ~ 9 bottles beer/wk"   Drug use: No   Sexual activity: Yes  Other Topics Concern   Not on file  Social History Narrative   Not on file   Social Determinants of Health   Financial Resource Strain: Not on file  Food Insecurity: Not on file  Transportation Needs: Not on file  Physical Activity: Not on file  Stress: Not on file  Social Connections: Not on file  Intimate Partner Violence: Not on file    Family History  Problem Relation  Age of Onset   Cancer Sister      Current Outpatient Medications:    acyclovir (ZOVIRAX) 400 MG tablet, TAKE 1 TABLET BY MOUTH TWICE A DAY, Disp: 60 tablet, Rfl: 1   atorvastatin (LIPITOR) 40 MG tablet, Take by mouth., Disp: , Rfl:    glimepiride (AMARYL) 1 MG tablet, Take 1 mg by mouth daily., Disp: , Rfl:    levofloxacin (LEVAQUIN) 500 MG tablet, TAKE 1 TABLET (500 MG TOTAL) BY MOUTH DAILY., Disp: 30 tablet, Rfl: 0   loratadine (CLARITIN) 10 MG tablet, Take 5 mg by mouth daily as needed. Take half a tablet (27m) daily, Disp: , Rfl:    metoprolol tartrate (LOPRESSOR) 25 MG tablet, Take 25 mg by mouth daily., Disp: , Rfl:    omeprazole (PRILOSEC) 40 MG capsule, TAKE 1 CAPSULE BY MOUTH TWICE A DAY, Disp: 180 capsule, Rfl: 1   oxyCODONE (OXY IR/ROXICODONE) 5 MG immediate release tablet, Take 1 tablet (5 mg total) by mouth every 8 (eight) hours as needed for severe pain., Disp: 90 tablet, Rfl: 0   senna-docusate (SENOKOT-S) 8.6-50 MG tablet, Take 1 tablet by mouth every morning., Disp: , Rfl:    traZODone (DESYREL) 50 MG tablet, TAKE 1-2 TABLETS BY MOUTH AT BEDTIME., Disp: 180 tablet, Rfl: 1   gabapentin (NEURONTIN) 300 MG capsule, Take 1 capsule (300 mg total) by mouth at bedtime. (Patient  not taking: Reported on 09/15/2020), Disp: 30 capsule, Rfl: 0   LORazepam (ATIVAN) 0.5 MG tablet, Take 1 tablet (0.5 mg total) by mouth every 6 (six) hours as needed (Nausea or vomiting). (Patient not taking: Reported on 06/05/2020), Disp: 30 tablet, Rfl: 0   losartan (COZAAR) 25 MG tablet, Take 25 mg by mouth daily. (Patient not taking: Reported on 09/15/2020), Disp: , Rfl:    nitroGLYCERIN (NITROSTAT) 0.3 MG SL tablet, nitroglycerin 0.3 mg sublingual tablet  PLACE ONE TABLET (0.3 MG DOSE) UNDER THE TONGUE EVERY 5 (FIVE) MINUTES AS NEEDED FOR CHEST PAIN. (Patient not taking: Reported on 06/23/2020), Disp: , Rfl:    ondansetron (ZOFRAN) 8 MG tablet, Take 1 tablet (8 mg total) by mouth 2 (two) times daily as needed (Nausea or vomiting). (Patient not taking: Reported on 06/05/2020), Disp: 30 tablet, Rfl: 1   predniSONE (DELTASONE) 20 MG tablet, TAKE 1 TABLET BY MOUTH DAILY X 5 DAYS IN AM WITH FOOD (Patient not taking: Reported on 09/15/2020), Disp: , Rfl:    prochlorperazine (COMPAZINE) 10 MG tablet, Take 1 tablet (10 mg total) by mouth every 6 (six) hours as needed (Nausea or vomiting). (Patient not taking: Reported on 09/15/2020), Disp: 30 tablet, Rfl: 1  Physical exam:  Vitals:   12/22/20 0912  BP: 127/67  Pulse: 75  Resp: 16  Temp: 98.9 F (37.2 C)  TempSrc: Tympanic  SpO2: 99%  Weight: 194 lb (88 kg)  Height: 6' (1.829 m)   Physical Exam Constitutional:      General: He is not in acute distress.    Comments: Accompanied by wife  Cardiovascular:     Rate and Rhythm: Normal rate and regular rhythm.     Heart sounds: Normal heart sounds.  Pulmonary:     Effort: Pulmonary effort is normal.     Breath sounds: Normal breath sounds.  Abdominal:     General: Bowel sounds are normal.     Palpations: Abdomen is soft.  Musculoskeletal:     Comments: Ambulating w/o aids  Skin:    General: Skin is warm and dry.  Neurological:  Mental Status: He is alert and oriented to person, place, and time.   Psychiatric:        Mood and Affect: Mood normal.        Behavior: Behavior normal.     CMP Latest Ref Rng & Units 12/22/2020  Glucose 70 - 99 mg/dL 249(H)  BUN 8 - 23 mg/dL 16  Creatinine 0.61 - 1.24 mg/dL 1.04  Sodium 135 - 145 mmol/L 136  Potassium 3.5 - 5.1 mmol/L 4.0  Chloride 98 - 111 mmol/L 102  CO2 22 - 32 mmol/L 27  Calcium 8.9 - 10.3 mg/dL 9.0  Total Protein 6.5 - 8.1 g/dL 6.9  Total Bilirubin 0.3 - 1.2 mg/dL 1.4(H)  Alkaline Phos 38 - 126 U/L 63  AST 15 - 41 U/L 30  ALT 0 - 44 U/L 32   CBC Latest Ref Rng & Units 12/22/2020  WBC 4.0 - 10.5 K/uL 2.5(L)  Hemoglobin 13.0 - 17.0 g/dL 14.2  Hematocrit 39.0 - 52.0 % 40.0  Platelets 150 - 400 K/uL 54(L)    Assessment and plan- Patient is a 75 y.o. male  with high risk MDS and T p53 mutation.  He is here for on treatment assessment prior to cycle 8-day 1 of Vidaza  After starting Vidaza his hemoglobin improved dramatically from 6-14.  White count also improved but over the past few months he has been more consistently experiencing neutropenia.  Today it is better however (see below).  Plan to continue Vidaza until progression of disease or toxicity. Proceed with vidaza x 5 days. Follow up with Dr. Janese Banks for consideration of cycle 9.   Neutropenia-ANC is 1.5 today.  Proceed with 5 days.  If neutrophil count remains low despite the use of 5 days, would consider bone marrow biopsy.  Thrombocytopenia as well and it is unclear if it is related to MDS versus Vidaza since his platelet count improved to 132 at one-point but then following that it has been fluctuating between 50s to 90s. Additionally, he does have some splenomegaly on previous MRI. As long as platelets are greater than 50 okay to receive Vidaza. Today, platelets are 54. Ok to proceed.   History of hepatocellular carcinoma s/p thermal ablation. AFP 11/17/20 was 3.6. MRI abdomen planned for 01/13/21. Last was 07/30/20 and showed improvement in size of antior right lobe mass  consistent with treatment response.   Elevated bilirubin- 1.4 today. Monitor.   Hip pain- follow up with ortho. Can consider checking cbc prior to injection if requested by ortho.   Follow up as scheduled.   Visit Diagnosis 1. MDS (myelodysplastic syndrome) ([place])   2. Encounter for antineoplastic chemotherapy   3. Thrombocytopenia ([place])    Beckey Rutter, [place], AGNP-C [place] at [facility] [phone] (clinic) 12/22/2020

## 2020-12-23 ENCOUNTER — Ambulatory Visit: Payer: Medicare HMO

## 2020-12-23 ENCOUNTER — Inpatient Hospital Stay: Payer: Medicare HMO

## 2020-12-23 VITALS — BP 127/69 | HR 64 | Temp 98.0°F | Resp 17

## 2020-12-23 DIAGNOSIS — D46C Myelodysplastic syndrome with isolated del(5q) chromosomal abnormality: Secondary | ICD-10-CM | POA: Diagnosis not present

## 2020-12-23 DIAGNOSIS — D469 Myelodysplastic syndrome, unspecified: Secondary | ICD-10-CM

## 2020-12-23 DIAGNOSIS — D696 Thrombocytopenia, unspecified: Secondary | ICD-10-CM | POA: Diagnosis not present

## 2020-12-23 DIAGNOSIS — D709 Neutropenia, unspecified: Secondary | ICD-10-CM | POA: Diagnosis not present

## 2020-12-23 DIAGNOSIS — Z5111 Encounter for antineoplastic chemotherapy: Secondary | ICD-10-CM | POA: Diagnosis not present

## 2020-12-23 DIAGNOSIS — Z8505 Personal history of malignant neoplasm of liver: Secondary | ICD-10-CM | POA: Diagnosis not present

## 2020-12-23 MED ORDER — ONDANSETRON HCL 4 MG PO TABS
8.0000 mg | ORAL_TABLET | Freq: Once | ORAL | Status: AC
  Administered 2020-12-23: 11:00:00 8 mg via ORAL
  Filled 2020-12-23: qty 2

## 2020-12-23 MED ORDER — AZACITIDINE CHEMO SQ INJECTION
60.0000 mg/m2 | Freq: Once | INTRAMUSCULAR | Status: AC
  Administered 2020-12-23: 11:00:00 130 mg via SUBCUTANEOUS
  Filled 2020-12-23: qty 5.2

## 2020-12-23 NOTE — Patient Instructions (Signed)
[facility] CANCER CTR AT [place]  Discharge Instructions: Thank you for choosing [facility] to provide your oncology and hematology care.  If you have a lab appointment with the [facility], please go directly to the [facility] and check in at the registration area.  Wear comfortable clothing and clothing appropriate for easy access to any Portacath or PICC line.   We strive to give you quality time with your provider. You may need to reschedule your appointment if you arrive late (15 or more minutes).  Arriving late affects you and other patients whose appointments are after yours.  Also, if you miss three or more appointments without notifying the office, you may be dismissed from the clinic at the providers discretion.      For prescription refill requests, have your pharmacy contact our office and allow 72 hours for refills to be completed.    Today you received the following chemotherapy and/or immunotherapy agents Vidaza      To help prevent nausea and vomiting after your treatment, we encourage you to take your nausea medication as directed.  BELOW ARE SYMPTOMS THAT SHOULD BE REPORTED IMMEDIATELY: *FEVER GREATER THAN 100.4 F (38 C) OR HIGHER *CHILLS OR SWEATING *NAUSEA AND VOMITING THAT IS NOT CONTROLLED WITH YOUR NAUSEA MEDICATION *UNUSUAL SHORTNESS OF BREATH *UNUSUAL BRUISING OR BLEEDING *URINARY PROBLEMS (pain or burning when urinating, or frequent urination) *BOWEL PROBLEMS (unusual diarrhea, constipation, pain near the anus) TENDERNESS IN MOUTH AND THROAT WITH OR WITHOUT PRESENCE OF ULCERS (sore throat, sores in mouth, or a toothache) UNUSUAL RASH, SWELLING OR PAIN  UNUSUAL VAGINAL DISCHARGE OR ITCHING   Items with * indicate a potential emergency and should be followed up as soon as possible or go to the Emergency Department if any problems should occur.  Please show the CHEMOTHERAPY ALERT CARD or IMMUNOTHERAPY ALERT CARD at check-in to the  Emergency Department and triage nurse.  Should you have questions after your visit or need to cancel or reschedule your appointment, please contact [facility] CANCER [place] AT [place]  [phone] and follow the prompts.  Office hours are 8:00 a.m. to 4:30 p.m. Monday - Friday. Please note that voicemails left after 4:00 p.m. may not be returned until the following business day.  We are closed weekends and major holidays. You have access to a nurse at all times for urgent questions. Please call the main number to the clinic [phone] and follow the prompts.  For any non-urgent questions, you may also contact your provider using MyChart. We now offer e-Visits for anyone 31 and older to request care online for non-urgent symptoms. For details visit [email or web address].   Also download the MyChart app! Go to the app store, search "MyChart", open the app, select [facility], and log in with your MyChart username and password.  Due to Covid, a mask is required upon entering the hospital/clinic. If you do not have a mask, one will be given to you upon arrival. For doctor visits, patients may have 1 support person aged 65 or older with them. For treatment visits, patients cannot have anyone with them due to current Covid guidelines and our immunocompromised population.

## 2020-12-24 ENCOUNTER — Other Ambulatory Visit: Payer: Self-pay

## 2020-12-24 ENCOUNTER — Ambulatory Visit: Payer: Medicare HMO

## 2020-12-24 ENCOUNTER — Inpatient Hospital Stay: Payer: Medicare HMO

## 2020-12-24 VITALS — BP 137/68 | HR 76 | Temp 97.0°F | Resp 16

## 2020-12-24 DIAGNOSIS — D696 Thrombocytopenia, unspecified: Secondary | ICD-10-CM | POA: Diagnosis not present

## 2020-12-24 DIAGNOSIS — D469 Myelodysplastic syndrome, unspecified: Secondary | ICD-10-CM

## 2020-12-24 DIAGNOSIS — D46C Myelodysplastic syndrome with isolated del(5q) chromosomal abnormality: Secondary | ICD-10-CM | POA: Diagnosis not present

## 2020-12-24 DIAGNOSIS — Z8505 Personal history of malignant neoplasm of liver: Secondary | ICD-10-CM | POA: Diagnosis not present

## 2020-12-24 DIAGNOSIS — D709 Neutropenia, unspecified: Secondary | ICD-10-CM | POA: Diagnosis not present

## 2020-12-24 DIAGNOSIS — Z5111 Encounter for antineoplastic chemotherapy: Secondary | ICD-10-CM | POA: Diagnosis not present

## 2020-12-24 MED ORDER — ONDANSETRON HCL 4 MG PO TABS
8.0000 mg | ORAL_TABLET | Freq: Once | ORAL | Status: AC
  Administered 2020-12-24: 11:00:00 8 mg via ORAL
  Filled 2020-12-24: qty 2

## 2020-12-24 MED ORDER — AZACITIDINE CHEMO SQ INJECTION
60.0000 mg/m2 | Freq: Once | INTRAMUSCULAR | Status: AC
  Administered 2020-12-24: 11:00:00 130 mg via SUBCUTANEOUS
  Filled 2020-12-24: qty 5.2

## 2020-12-24 NOTE — Patient Instructions (Signed)
[facility] CANCER CTR AT [place]  Discharge Instructions: Thank you for choosing [facility] to provide your oncology and hematology care.  If you have a lab appointment with the [facility], please go directly to the [facility] and check in at the registration area.  Wear comfortable clothing and clothing appropriate for easy access to any Portacath or PICC line.   We strive to give you quality time with your provider. You may need to reschedule your appointment if you arrive late (15 or more minutes).  Arriving late affects you and other patients whose appointments are after yours.  Also, if you miss three or more appointments without notifying the office, you may be dismissed from the clinic at the providers discretion.      For prescription refill requests, have your pharmacy contact our office and allow 72 hours for refills to be completed.    Today you received the following chemotherapy and/or immunotherapy agents Vidaza      To help prevent nausea and vomiting after your treatment, we encourage you to take your nausea medication as directed.  BELOW ARE SYMPTOMS THAT SHOULD BE REPORTED IMMEDIATELY: *FEVER GREATER THAN 100.4 F (38 C) OR HIGHER *CHILLS OR SWEATING *NAUSEA AND VOMITING THAT IS NOT CONTROLLED WITH YOUR NAUSEA MEDICATION *UNUSUAL SHORTNESS OF BREATH *UNUSUAL BRUISING OR BLEEDING *URINARY PROBLEMS (pain or burning when urinating, or frequent urination) *BOWEL PROBLEMS (unusual diarrhea, constipation, pain near the anus) TENDERNESS IN MOUTH AND THROAT WITH OR WITHOUT PRESENCE OF ULCERS (sore throat, sores in mouth, or a toothache) UNUSUAL RASH, SWELLING OR PAIN  UNUSUAL VAGINAL DISCHARGE OR ITCHING   Items with * indicate a potential emergency and should be followed up as soon as possible or go to the Emergency Department if any problems should occur.  Please show the CHEMOTHERAPY ALERT CARD or IMMUNOTHERAPY ALERT CARD at check-in to the  Emergency Department and triage nurse.  Should you have questions after your visit or need to cancel or reschedule your appointment, please contact [facility] CANCER [place] AT [place]  [phone] and follow the prompts.  Office hours are 8:00 a.m. to 4:30 p.m. Monday - Friday. Please note that voicemails left after 4:00 p.m. may not be returned until the following business day.  We are closed weekends and major holidays. You have access to a nurse at all times for urgent questions. Please call the main number to the clinic [phone] and follow the prompts.  For any non-urgent questions, you may also contact your provider using MyChart. We now offer e-Visits for anyone 66 and older to request care online for non-urgent symptoms. For details visit [email or web address].   Also download the MyChart app! Go to the app store, search "MyChart", open the app, select [place], and log in with your MyChart username and password.  Due to Covid, a mask is required upon entering the hospital/clinic. If you do not have a mask, one will be given to you upon arrival. For doctor visits, patients may have 1 support person aged 24 or older with them. For treatment visits, patients cannot have anyone with them due to current Covid guidelines and our immunocompromised population.

## 2020-12-25 ENCOUNTER — Inpatient Hospital Stay: Payer: Medicare HMO

## 2020-12-25 ENCOUNTER — Ambulatory Visit: Payer: Medicare HMO

## 2020-12-25 VITALS — BP 127/67 | HR 64 | Temp 96.0°F | Resp 17

## 2020-12-25 DIAGNOSIS — D696 Thrombocytopenia, unspecified: Secondary | ICD-10-CM | POA: Diagnosis not present

## 2020-12-25 DIAGNOSIS — D469 Myelodysplastic syndrome, unspecified: Secondary | ICD-10-CM

## 2020-12-25 DIAGNOSIS — D46C Myelodysplastic syndrome with isolated del(5q) chromosomal abnormality: Secondary | ICD-10-CM | POA: Diagnosis not present

## 2020-12-25 DIAGNOSIS — Z5111 Encounter for antineoplastic chemotherapy: Secondary | ICD-10-CM | POA: Diagnosis not present

## 2020-12-25 DIAGNOSIS — Z8505 Personal history of malignant neoplasm of liver: Secondary | ICD-10-CM | POA: Diagnosis not present

## 2020-12-25 DIAGNOSIS — D709 Neutropenia, unspecified: Secondary | ICD-10-CM | POA: Diagnosis not present

## 2020-12-25 MED ORDER — AZACITIDINE CHEMO SQ INJECTION
60.0000 mg/m2 | Freq: Once | INTRAMUSCULAR | Status: AC
  Administered 2020-12-25: 11:00:00 130 mg via SUBCUTANEOUS
  Filled 2020-12-25: qty 5.2

## 2020-12-25 MED ORDER — ONDANSETRON HCL 4 MG PO TABS
8.0000 mg | ORAL_TABLET | Freq: Once | ORAL | Status: AC
  Administered 2020-12-25: 10:00:00 8 mg via ORAL
  Filled 2020-12-25: qty 2

## 2020-12-25 NOTE — Patient Instructions (Signed)
[facility] CANCER CTR AT [place]  Discharge Instructions: Thank you for choosing [facility] to provide your oncology and hematology care.  If you have a lab appointment with the [facility], please go directly to the [place] and check in at the registration area.  Wear comfortable clothing and clothing appropriate for easy access to any Portacath or PICC line.   We strive to give you quality time with your provider. You may need to reschedule your appointment if you arrive late (15 or more minutes).  Arriving late affects you and other patients whose appointments are after yours.  Also, if you miss three or more appointments without notifying the office, you may be dismissed from the clinic at the providers discretion.      For prescription refill requests, have your pharmacy contact our office and allow 72 hours for refills to be completed.    Today you received the following chemotherapy and/or immunotherapy agents: Vidaza      To help prevent nausea and vomiting after your treatment, we encourage you to take your nausea medication as directed.  BELOW ARE SYMPTOMS THAT SHOULD BE REPORTED IMMEDIATELY: *FEVER GREATER THAN 100.4 F (38 C) OR HIGHER *CHILLS OR SWEATING *NAUSEA AND VOMITING THAT IS NOT CONTROLLED WITH YOUR NAUSEA MEDICATION *UNUSUAL SHORTNESS OF BREATH *UNUSUAL BRUISING OR BLEEDING *URINARY PROBLEMS (pain or burning when urinating, or frequent urination) *BOWEL PROBLEMS (unusual diarrhea, constipation, pain near the anus) TENDERNESS IN MOUTH AND THROAT WITH OR WITHOUT PRESENCE OF ULCERS (sore throat, sores in mouth, or a toothache) UNUSUAL RASH, SWELLING OR PAIN  UNUSUAL VAGINAL DISCHARGE OR ITCHING   Items with * indicate a potential emergency and should be followed up as soon as possible or go to the Emergency Department if any problems should occur.  Please show the CHEMOTHERAPY ALERT CARD or IMMUNOTHERAPY ALERT CARD at check-in to the  Emergency Department and triage nurse.  Should you have questions after your visit or need to cancel or reschedule your appointment, please contact [facility] CANCER [place] AT [place]  [phone] and follow the prompts.  Office hours are 8:00 a.m. to 4:30 p.m. Monday - Friday. Please note that voicemails left after 4:00 p.m. may not be returned until the following business day.  We are closed weekends and major holidays. You have access to a nurse at all times for urgent questions. Please call the main number to the clinic [phone] and follow the prompts.  For any non-urgent questions, you may also contact your provider using MyChart. We now offer e-Visits for anyone 46 and older to request care online for non-urgent symptoms. For details visit [email or web address].   Also download the MyChart app! Go to the app store, search "MyChart", open the app, select [facility], and log in with your MyChart username and password.  Due to Covid, a mask is required upon entering the hospital/clinic. If you do not have a mask, one will be given to you upon arrival. For doctor visits, patients may have 1 support person aged 67 or older with them. For treatment visits, patients cannot have anyone with them due to current Covid guidelines and our immunocompromised population. Azacitidine suspension for injection (subcutaneous use) What is this medication? AZACITIDINE (ay [place]) is a chemotherapy drug. This medicine reduces the growth of cancer cells and can suppress the immune system. It is used for treating myelodysplastic syndrome or some types of leukemia. This medicine may be used for other purposes; ask your health  care provider or pharmacist if you have questions. COMMON BRAND NAME(S): Vidaza What should I tell my care team before I take this medication? They need to know if you have any of these conditions: kidney disease liver disease liver tumors an unusual or allergic reaction  to azacitidine, mannitol, other medicines, foods, dyes, or preservatives pregnant or trying to get pregnant breast-feeding How should I use this medication? This medicine is for injection under the skin. It is administered in a hospital or clinic by a specially trained health care professional. Talk to your pediatrician regarding the use of this medicine in children. While this drug may be prescribed for selected conditions, precautions do apply. Overdosage: If you think you have taken too much of this medicine contact a poison control center or emergency room at once. NOTE: This medicine is only for you. Do not share this medicine with others. What if I miss a dose? It is important not to miss your dose. Call your doctor or health care professional if you are unable to keep an appointment. What may interact with this medication? Interactions have not been studied. Give your health care provider a list of all the medicines, herbs, non-prescription drugs, or dietary supplements you use. Also tell them if you smoke, drink alcohol, or use illegal drugs. Some items may interact with your medicine. This list may not describe all possible interactions. Give your health care provider a list of all the medicines, herbs, non-prescription drugs, or dietary supplements you use. Also tell them if you smoke, drink alcohol, or use illegal drugs. Some items may interact with your medicine. What should I watch for while using this medication? Visit your doctor for checks on your progress. This drug may make you feel generally unwell. This is not uncommon, as chemotherapy can affect healthy cells as well as cancer cells. Report any side effects. Continue your course of treatment even though you feel ill unless your doctor tells you to stop. In some cases, you may be given additional medicines to help with side effects. Follow all directions for their use. Call your doctor or health care professional for advice if  you get a fever, chills or sore throat, or other symptoms of a cold or flu. Do not treat yourself. This drug decreases your body's ability to fight infections. Try to avoid being around people who are sick. This medicine may increase your risk to bruise or bleed. Call your doctor or health care professional if you notice any unusual bleeding. You may need blood work done while you are taking this medicine. Do not become pregnant while taking this medicine and for 6 months after the last dose. Women should inform their doctor if they wish to become pregnant or think they might be pregnant. Men should not father a child while taking this medicine and for 3 months after the last dose. There is a potential for serious side effects to an unborn child. Talk to your health care professional or pharmacist for more information. Do not breast-feed an infant while taking this medicine and for 1 week after the last dose. This medicine may interfere with the ability to have a child. Talk with your doctor or health care professional if you are concerned about your fertility. What side effects may I notice from receiving this medication? Side effects that you should report to your doctor or health care professional as soon as possible: allergic reactions like skin rash, itching or hives, swelling of the face, lips, or tongue  low blood counts - this medicine may decrease the number of white blood cells, red blood cells and platelets. You may be at increased risk for infections and bleeding. signs of infection - fever or chills, cough, sore throat, pain passing urine signs of decreased platelets or bleeding - bruising, pinpoint red spots on the skin, black, tarry stools, blood in the urine signs of decreased red blood cells - unusually weak or tired, fainting spells, lightheadedness signs and symptoms of kidney injury like trouble passing urine or change in the amount of urine signs and symptoms of liver injury like  dark yellow or brown urine; general ill feeling or flu-like symptoms; light-colored stools; loss of appetite; nausea; right upper belly pain; unusually weak or tired; yellowing of the eyes or skin Side effects that usually do not require medical attention (report to your doctor or health care professional if they continue or are bothersome): constipation diarrhea nausea, vomiting pain or redness at the injection site unusually weak or tired This list may not describe all possible side effects. Call your doctor for medical advice about side effects. You may report side effects to FDA at 1-800-FDA-1088. Where should I keep my medication? This drug is given in a hospital or clinic and will not be stored at home. NOTE: This sheet is a summary. It may not cover all possible information. If you have questions about this medicine, talk to your doctor, pharmacist, or health care provider.  2022 Elsevier/Gold Standard (2016-01-21 00:00:00)

## 2020-12-26 ENCOUNTER — Ambulatory Visit: Payer: Medicare HMO

## 2020-12-26 ENCOUNTER — Inpatient Hospital Stay: Payer: Medicare HMO

## 2020-12-26 ENCOUNTER — Other Ambulatory Visit: Payer: Self-pay

## 2020-12-26 VITALS — BP 119/68 | HR 76 | Temp 96.0°F | Resp 18

## 2020-12-26 DIAGNOSIS — D46C Myelodysplastic syndrome with isolated del(5q) chromosomal abnormality: Secondary | ICD-10-CM | POA: Diagnosis not present

## 2020-12-26 DIAGNOSIS — Z8505 Personal history of malignant neoplasm of liver: Secondary | ICD-10-CM | POA: Diagnosis not present

## 2020-12-26 DIAGNOSIS — D469 Myelodysplastic syndrome, unspecified: Secondary | ICD-10-CM

## 2020-12-26 DIAGNOSIS — Z5111 Encounter for antineoplastic chemotherapy: Secondary | ICD-10-CM | POA: Diagnosis not present

## 2020-12-26 DIAGNOSIS — D696 Thrombocytopenia, unspecified: Secondary | ICD-10-CM | POA: Diagnosis not present

## 2020-12-26 DIAGNOSIS — D709 Neutropenia, unspecified: Secondary | ICD-10-CM | POA: Diagnosis not present

## 2020-12-26 MED ORDER — ONDANSETRON HCL 4 MG PO TABS
8.0000 mg | ORAL_TABLET | Freq: Once | ORAL | Status: AC
  Administered 2020-12-26: 10:00:00 8 mg via ORAL
  Filled 2020-12-26: qty 2

## 2020-12-26 MED ORDER — AZACITIDINE CHEMO SQ INJECTION
60.0000 mg/m2 | Freq: Once | INTRAMUSCULAR | Status: AC
  Administered 2020-12-26: 10:00:00 130 mg via SUBCUTANEOUS
  Filled 2020-12-26: qty 5.2

## 2021-01-01 DIAGNOSIS — E138 Other specified diabetes mellitus with unspecified complications: Secondary | ICD-10-CM | POA: Diagnosis not present

## 2021-01-01 DIAGNOSIS — E785 Hyperlipidemia, unspecified: Secondary | ICD-10-CM | POA: Diagnosis not present

## 2021-01-01 DIAGNOSIS — I2510 Atherosclerotic heart disease of native coronary artery without angina pectoris: Secondary | ICD-10-CM | POA: Diagnosis not present

## 2021-01-02 ENCOUNTER — Other Ambulatory Visit: Payer: Self-pay | Admitting: Oncology

## 2021-01-07 ENCOUNTER — Other Ambulatory Visit: Payer: Self-pay | Admitting: Oncology

## 2021-01-07 DIAGNOSIS — D469 Myelodysplastic syndrome, unspecified: Secondary | ICD-10-CM

## 2021-01-09 DIAGNOSIS — I719 Aortic aneurysm of unspecified site, without rupture: Secondary | ICD-10-CM | POA: Diagnosis not present

## 2021-01-09 DIAGNOSIS — E138 Other specified diabetes mellitus with unspecified complications: Secondary | ICD-10-CM | POA: Diagnosis not present

## 2021-01-09 DIAGNOSIS — G4700 Insomnia, unspecified: Secondary | ICD-10-CM | POA: Diagnosis not present

## 2021-01-09 DIAGNOSIS — I10 Essential (primary) hypertension: Secondary | ICD-10-CM | POA: Diagnosis not present

## 2021-01-09 DIAGNOSIS — E785 Hyperlipidemia, unspecified: Secondary | ICD-10-CM | POA: Diagnosis not present

## 2021-01-09 DIAGNOSIS — D469 Myelodysplastic syndrome, unspecified: Secondary | ICD-10-CM | POA: Diagnosis not present

## 2021-01-09 DIAGNOSIS — I25119 Atherosclerotic heart disease of native coronary artery with unspecified angina pectoris: Secondary | ICD-10-CM | POA: Diagnosis not present

## 2021-01-09 DIAGNOSIS — D84821 Immunodeficiency due to drugs: Secondary | ICD-10-CM | POA: Diagnosis not present

## 2021-01-09 DIAGNOSIS — K861 Other chronic pancreatitis: Secondary | ICD-10-CM | POA: Diagnosis not present

## 2021-01-09 DIAGNOSIS — E1142 Type 2 diabetes mellitus with diabetic polyneuropathy: Secondary | ICD-10-CM | POA: Diagnosis not present

## 2021-01-09 DIAGNOSIS — G8929 Other chronic pain: Secondary | ICD-10-CM | POA: Diagnosis not present

## 2021-01-09 DIAGNOSIS — K219 Gastro-esophageal reflux disease without esophagitis: Secondary | ICD-10-CM | POA: Diagnosis not present

## 2021-01-09 DIAGNOSIS — I2510 Atherosclerotic heart disease of native coronary artery without angina pectoris: Secondary | ICD-10-CM | POA: Diagnosis not present

## 2021-01-09 DIAGNOSIS — N400 Enlarged prostate without lower urinary tract symptoms: Secondary | ICD-10-CM | POA: Diagnosis not present

## 2021-01-09 DIAGNOSIS — E1151 Type 2 diabetes mellitus with diabetic peripheral angiopathy without gangrene: Secondary | ICD-10-CM | POA: Diagnosis not present

## 2021-01-12 ENCOUNTER — Other Ambulatory Visit: Payer: Medicare HMO

## 2021-01-13 ENCOUNTER — Ambulatory Visit: Payer: Medicare HMO

## 2021-01-14 ENCOUNTER — Other Ambulatory Visit: Payer: Self-pay | Admitting: *Deleted

## 2021-01-14 DIAGNOSIS — D469 Myelodysplastic syndrome, unspecified: Secondary | ICD-10-CM

## 2021-01-19 ENCOUNTER — Encounter: Payer: Self-pay | Admitting: Oncology

## 2021-01-19 ENCOUNTER — Other Ambulatory Visit: Payer: Self-pay

## 2021-01-19 ENCOUNTER — Inpatient Hospital Stay: Payer: Medicare HMO

## 2021-01-19 ENCOUNTER — Telehealth: Payer: Self-pay | Admitting: Oncology

## 2021-01-19 ENCOUNTER — Inpatient Hospital Stay: Payer: Medicare HMO | Attending: Nurse Practitioner

## 2021-01-19 ENCOUNTER — Inpatient Hospital Stay (HOSPITAL_BASED_OUTPATIENT_CLINIC_OR_DEPARTMENT_OTHER): Payer: Medicare HMO | Admitting: Oncology

## 2021-01-19 VITALS — BP 120/63 | HR 76 | Temp 97.8°F | Wt 200.4 lb

## 2021-01-19 DIAGNOSIS — D708 Other neutropenia: Secondary | ICD-10-CM

## 2021-01-19 DIAGNOSIS — D469 Myelodysplastic syndrome, unspecified: Secondary | ICD-10-CM | POA: Diagnosis not present

## 2021-01-19 DIAGNOSIS — Z8505 Personal history of malignant neoplasm of liver: Secondary | ICD-10-CM | POA: Diagnosis not present

## 2021-01-19 DIAGNOSIS — D46C Myelodysplastic syndrome with isolated del(5q) chromosomal abnormality: Secondary | ICD-10-CM | POA: Insufficient documentation

## 2021-01-19 DIAGNOSIS — D696 Thrombocytopenia, unspecified: Secondary | ICD-10-CM | POA: Diagnosis not present

## 2021-01-19 LAB — CBC WITH DIFFERENTIAL/PLATELET
Abs Immature Granulocytes: 0.01 10*3/uL (ref 0.00–0.07)
Basophils Absolute: 0 10*3/uL (ref 0.0–0.1)
Basophils Relative: 2 %
Eosinophils Absolute: 0 10*3/uL (ref 0.0–0.5)
Eosinophils Relative: 2 %
HCT: 38.7 % — ABNORMAL LOW (ref 39.0–52.0)
Hemoglobin: 13.8 g/dL (ref 13.0–17.0)
Immature Granulocytes: 1 %
Lymphocytes Relative: 47 %
Lymphs Abs: 0.9 10*3/uL (ref 0.7–4.0)
MCH: 35.9 pg — ABNORMAL HIGH (ref 26.0–34.0)
MCHC: 35.7 g/dL (ref 30.0–36.0)
MCV: 100.8 fL — ABNORMAL HIGH (ref 80.0–100.0)
Monocytes Absolute: 0.1 10*3/uL (ref 0.1–1.0)
Monocytes Relative: 7 %
Neutro Abs: 0.8 10*3/uL — ABNORMAL LOW (ref 1.7–7.7)
Neutrophils Relative %: 41 %
Platelets: 36 10*3/uL — ABNORMAL LOW (ref 150–400)
RBC: 3.84 MIL/uL — ABNORMAL LOW (ref 4.22–5.81)
RDW: 14.7 % (ref 11.5–15.5)
Smear Review: DECREASED
WBC: 1.9 10*3/uL — ABNORMAL LOW (ref 4.0–10.5)
nRBC: 0 % (ref 0.0–0.2)

## 2021-01-19 LAB — COMPREHENSIVE METABOLIC PANEL
ALT: 22 U/L (ref 0–44)
AST: 25 U/L (ref 15–41)
Albumin: 4.3 g/dL (ref 3.5–5.0)
Alkaline Phosphatase: 51 U/L (ref 38–126)
Anion gap: 6 (ref 5–15)
BUN: 15 mg/dL (ref 8–23)
CO2: 29 mmol/L (ref 22–32)
Calcium: 8.7 mg/dL — ABNORMAL LOW (ref 8.9–10.3)
Chloride: 101 mmol/L (ref 98–111)
Creatinine, Ser: 0.91 mg/dL (ref 0.61–1.24)
GFR, Estimated: >60 mL/min (ref >60)
Glucose, Bld: 244 mg/dL — ABNORMAL HIGH (ref 70–99)
Potassium: 4.1 mmol/L (ref 3.5–5.1)
Sodium: 136 mmol/L (ref 135–145)
Total Bilirubin: 1.7 mg/dL — ABNORMAL HIGH (ref 0.3–1.2)
Total Protein: 6.6 g/dL (ref 6.5–8.1)

## 2021-01-19 NOTE — Progress Notes (Signed)
Pt is having some stomach discomfort after dinner. Pt has also been having some pain in his lower/middle/right back. It is sore if pt turns a certain way. He has tried hot/cold compresses on it and it doesn't seem to help any.

## 2021-01-19 NOTE — Progress Notes (Signed)
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 [place] s/p thermal ablation  Chief complaint/ Reason for visit-on treatment assessment prior to cycle 9-day 1 of Vidaza  Heme/Onc history: Patient is a 76 year old male with a history of cirrhosis and hepatocellular carcinoma for which she had proton beam radiation In June 2021.  He has baseline thrombocytopenia which was initially attributed to his cirrhosis.  He was found to have progressive pancytopenia with a steady decline in his white cell count since March 2022 when it was down to 3.4 with a platelet count of 53.  He was hospitalized sometime in April 2022 with acute biliary gallstone pancreatitis .  He underwent ERCP inpatient with platelet transfusion which showed sludge and biliary sphincterotomy was performed.  His bilirubin which was elevated up to 7.2 has come down to 2.4 presently.   However his pancytopenia has continued to worsen and presently white count is 1.1 with an H&H of 6.2/17.6 with an MCV of 102 and a platelet count of 33.Bone marrow biopsy showed evidence of dyspoietic changes in the erythroid and megakaryocyte excel lines associated with ring sideroblasts.  Definite increase in blastic cells not identified.  Overall features favor myelodysplastic syndrome.  Karyotype was normal but only for metaphase cells were analyzed which were not sufficient.  [employer] FISH MDS panel showed deletion of 5 q. along with monosomy 7 and deletion 7 q. without excess blasts.  SF3B1 mutation negative.   Peripheral blood intellegin myeloid  panel showed :   Detected     Change       Frequency (%) Impact  TP53   c.716A>C     p.Asn239Thr  19            Tier I  U2AF1  c.470A>C     p.Gln157Pro  17            Tier I  IDH1   c.315C>T     p.Gly105Gly  60            Tier II      With regards to his hepatocellular carcinoma patient had an MRI abdomen on 04/24/2020 which showed that his primary liver mass which was previously 4.3 x 3.6 cmWas similar in size at 4.5 x 3.5 cm.    Patient was briefly on Revlimid for 5 q. deletion but after T p53 came back he was switched to [place] for 5 days every 28 days starting 05/27/2020    Patient was seen by Dr. Marvel Plan at [facility] for second opinion and he also had a repeat bone marrow biopsyDone on 08/14/2020.  Biopsy showed normocellular bone marrow with erythroid predominant trilineage hematopoiesis.  Megakaryocytes were typical in number with few hypolobated forms.  Full sequence of orderly maturation noted in both granulopoiesis and erythropoiesis.  3% blasts were seen by manual differential.   Plan is to continue Vidaza until progression or toxicity which was started in May 2022    Interval history-patient denies any new complaints at this time other than his baseline fatigue.  Denies any recurrent infections or hospitalizations.  He continues to remain on Levaquin prophylaxis.  ECOG PS- 1 Pain scale- 0   Review of systems- Review of Systems  Constitutional:  Positive for malaise/fatigue. Negative for chills, fever and weight loss.  HENT:  Negative for congestion, ear discharge and nosebleeds.   Eyes:  Negative for blurred vision.  Respiratory:  Negative for cough, hemoptysis, sputum production, shortness of breath and wheezing.   Cardiovascular:  Negative for chest pain, palpitations, orthopnea and claudication.  Gastrointestinal:  Negative for abdominal pain, blood in stool, constipation, diarrhea, heartburn, melena, nausea and vomiting.  Genitourinary:  Negative for dysuria, flank pain,  frequency, hematuria and urgency.  Musculoskeletal:  Negative for back pain, joint pain and myalgias.  Skin:  Negative for rash.  Neurological:  Negative for dizziness, tingling, focal weakness, seizures, weakness and headaches.  Endo/Heme/Allergies:  Does not bruise/bleed easily.  Psychiatric/Behavioral:  Negative for depression and suicidal ideas. The patient does not have insomnia.      Allergies  Allergen Reactions   Dome-Paste Bandage [Wound Dressings]     All bandaids cause a rash   Soap & Cleansers     "liquid soap" antibacterial -rash   Latex Rash and Itching     Past Medical History:  Diagnosis Date   Arthritis    "lower back" (10/24/2012)   Coronary artery disease    GERD (gastroesophageal reflux disease)    High cholesterol    Hypertension    Liver cancer ([place])    MDS (myelodysplastic syndrome) ([place])    Type II diabetes mellitus ([place])      Past Surgical History:  Procedure Laterality Date   APPENDECTOMY  2002   CARDIAC CATHETERIZATION  10/24/2012   CORONARY ARTERY BYPASS GRAFT N/A 10/26/2012   Procedure: CORONARY ARTERY BYPASS GRAFTING (CABG);  Surgeon: Melrose Nakayama, MD;  Location: [place];  Service: Open Heart Surgery;  Laterality: N/A;  Times 3 using left internal mammary artery and endoscopically harvested right saphenous vein   ERCP N/A 04/25/2020   Procedure: ENDOSCOPIC RETROGRADE CHOLANGIOPANCREATOGRAPHY (ERCP);  Surgeon: Lucilla Lame, MD;  [facility] ENDOSCOPY;  Service: Endoscopy;  Laterality: N/A;    Social History   Socioeconomic History   Marital status: Married    Spouse name: Not on file   Number of children: Not on file   Years of education: Not on file   Highest education level: Not on file  Occupational History   Not on file  Tobacco Use   Smoking status: Former    Packs/day: 0.50    Years: 30.00    Pack years: 15.00    Types: Cigarettes    Quit date: 08/28/2001    Years since quitting: 19.4   Smokeless tobacco: Never   Vaping Use   Vaping Use: Never used  Substance and Sexual Activity   Alcohol use: No    Alcohol/week: 0.0 standard drinks    Comment: 10/24/2012 "stopped drinking alcohol in 2003; used to drink ~ 9 bottles beer/wk"   Drug use: No   Sexual activity: Yes  Other Topics Concern   Not on file  Social History Narrative   Not on file   Social Determinants of Health   Financial Resource Strain: Not on file  Food Insecurity: Not on file  Transportation Needs: Not on file  Physical Activity: Not on file  Stress: Not on file  Social Connections: Not on file  Intimate Partner Violence: Not on file    Family History  Problem Relation Age of Onset   Cancer Sister      Current Outpatient  Medications:    acyclovir (ZOVIRAX) 400 MG tablet, TAKE 1 TABLET BY MOUTH TWICE A DAY, Disp: 60 tablet, Rfl: 1   atorvastatin (LIPITOR) 40 MG tablet, Take by mouth., Disp: , Rfl:    glimepiride (AMARYL) 1 MG tablet, Take 1 mg by mouth daily., Disp: , Rfl:    levofloxacin (LEVAQUIN) 500 MG tablet, TAKE 1 TABLET (500 MG TOTAL) BY MOUTH DAILY., Disp: 30 tablet, Rfl: 0   loratadine (CLARITIN) 10 MG tablet, Take 5 mg by mouth daily as needed. Take half a tablet (10m) daily, Disp: , Rfl:    metoprolol tartrate (LOPRESSOR) 25 MG tablet, Take 25 mg by mouth daily., Disp: , Rfl:    omeprazole (PRILOSEC) 40 MG capsule, TAKE 1 CAPSULE BY MOUTH TWICE A DAY, Disp: 180 capsule, Rfl: 1   oxyCODONE (OXY IR/ROXICODONE) 5 MG immediate release tablet, Take 1 tablet (5 mg total) by mouth every 8 (eight) hours as needed for severe pain., Disp: 90 tablet, Rfl: 0   senna-docusate (SENOKOT-S) 8.6-50 MG tablet, Take 1 tablet by mouth every morning., Disp: , Rfl:    traZODone (DESYREL) 50 MG tablet, TAKE 1-2 TABLETS BY MOUTH AT BEDTIME., Disp: 180 tablet, Rfl: 1   gabapentin (NEURONTIN) 300 MG capsule, Take 1 capsule (300 mg total) by mouth at bedtime. (Patient not taking: Reported on 09/15/2020), Disp: 30 capsule, Rfl: 0    LORazepam (ATIVAN) 0.5 MG tablet, Take 1 tablet (0.5 mg total) by mouth every 6 (six) hours as needed (Nausea or vomiting). (Patient not taking: Reported on 06/05/2020), Disp: 30 tablet, Rfl: 0   losartan (COZAAR) 25 MG tablet, Take 25 mg by mouth daily. (Patient not taking: Reported on 09/15/2020), Disp: , Rfl:    nitroGLYCERIN (NITROSTAT) 0.3 MG SL tablet, nitroglycerin 0.3 mg sublingual tablet  PLACE ONE TABLET (0.3 MG DOSE) UNDER THE TONGUE EVERY 5 (FIVE) MINUTES AS NEEDED FOR CHEST PAIN. (Patient not taking: Reported on 06/23/2020), Disp: , Rfl:    ondansetron (ZOFRAN) 8 MG tablet, Take 1 tablet (8 mg total) by mouth 2 (two) times daily as needed (Nausea or vomiting). (Patient not taking: Reported on 06/05/2020), Disp: 30 tablet, Rfl: 1   predniSONE (DELTASONE) 20 MG tablet, TAKE 1 TABLET BY MOUTH DAILY X 5 DAYS IN AM WITH FOOD (Patient not taking: Reported on 09/15/2020), Disp: , Rfl:    prochlorperazine (COMPAZINE) 10 MG tablet, Take 1 tablet (10 mg total) by mouth every 6 (six) hours as needed (Nausea or vomiting). (Patient not taking: Reported on 09/15/2020), Disp: 30 tablet, Rfl: 1  Physical exam:  Vitals:   01/19/21 0931  BP: 120/63  Pulse: 76  Temp: 97.8 F (36.6 C)  TempSrc: Tympanic  Weight: 200 lb 6.4 oz (90.9 kg)   Physical Exam Constitutional:      General: He is not in acute distress. Cardiovascular:     Rate and Rhythm: Normal rate and regular rhythm.     Heart sounds: Normal heart sounds.  Pulmonary:     Effort: Pulmonary effort is normal.     Breath sounds: Normal breath sounds.  Abdominal:     General: Bowel sounds are normal.     Palpations: Abdomen is soft.  Skin:    General: Skin is warm and dry.  Neurological:     Mental Status: He is alert and oriented to person, place, and time.     CMP Latest Ref Rng & Units 01/19/2021  Glucose 70 - 99 mg/dL 244(H)  BUN 8 - 23 mg/dL 15  Creatinine 0.61 - 1.24 mg/dL 0.91  Sodium 135 - 145 mmol/L 136  Potassium 3.5 - 5.1  mmol/L 4.1  Chloride 98 - 111 mmol/L 101  CO2 22 - 32 mmol/L 29  Calcium 8.9 - 10.3 mg/dL 8.7(L)  Total Protein 6.5 - 8.1 g/dL 6.6  Total Bilirubin 0.3 - 1.2 mg/dL 1.7(H)  Alkaline Phos 38 - 126 U/L 51  AST 15 - 41 U/L 25  ALT 0 - 44 U/L 22   CBC Latest Ref Rng & Units 01/19/2021  WBC 4.0 - 10.5 K/uL 1.9(L)  Hemoglobin 13.0 - 17.0 g/dL 13.8  Hematocrit 39.0 - 52.0 % 38.7(L)  Platelets 150 - 400 K/uL 36(L)     Assessment and plan- Patient is a 76 y.o. male with high risk MDS on Vidaza here for on treatment assessment prior to cycle 9-day 1 of Vidaza   Patient had significant neutropenia and thrombocytopenia as well as transfusion dependent anemia when he initially started treatment back in 2022 in May.  After initiating Vidaza his neutropenia as well as thrombocytopenia improved and his platelets have been mostly between 60s to 80s.  Patient was having intermittent neutropenia and counts were not holding up prior to every cycle of Vidaza and therefore Vidaza dose was reduced to 60 mg per metered square as well.  Despite that his white cell count is 1.9 today with an ANC of 0.8 and a platelet count of 36.  Hemoglobin is remained stable between 13-14.  However his neutropenia and thrombocytopenia is concerning for worsening MDS.  I will hold off on giving him treatment today and proceed with a bone marrow biopsy at this time.  I will see him back in about 10 days time after bone marrow results are back.  Patient canceled his MRI liver last time as he accidentally ate before the procedure.  We will keep that on hold for now in the light of his worsening cytopenias  Patient will continue Valtrex and Levaquin prophylaxis at this time   Visit Diagnosis 1. MDS (myelodysplastic syndrome) ([place])   2. Thrombocytopenia ([place])   3. Other neutropenia ([place])      Dr. Randa Evens, MD, MPH [facility] at [facility] [phone] 01/19/2021 12:32 PM

## 2021-01-19 NOTE — Telephone Encounter (Signed)
Spoke with patient about BMB scheduled on 01/26/21. Arrival time at 9am, NPO after midnight and patient understands he must have a driver. Also confirmed appointment on 1/30 with Dr. Janese Banks to review results.

## 2021-01-20 ENCOUNTER — Inpatient Hospital Stay: Payer: Medicare HMO

## 2021-01-21 ENCOUNTER — Other Ambulatory Visit: Payer: Self-pay | Admitting: Radiology

## 2021-01-21 ENCOUNTER — Inpatient Hospital Stay: Payer: Medicare HMO

## 2021-01-22 ENCOUNTER — Ambulatory Visit: Payer: Medicare HMO

## 2021-01-23 ENCOUNTER — Other Ambulatory Visit: Payer: Self-pay | Admitting: Radiology

## 2021-01-23 ENCOUNTER — Ambulatory Visit: Payer: Medicare HMO

## 2021-01-26 ENCOUNTER — Ambulatory Visit
Admission: RE | Admit: 2021-01-26 | Discharge: 2021-01-26 | Disposition: A | Discharge status: Home / Self Care | Payer: Medicare HMO | Source: Ambulatory Visit | Attending: Oncology | Admitting: Oncology

## 2021-01-26 ENCOUNTER — Other Ambulatory Visit: Payer: Self-pay | Admitting: Oncology

## 2021-01-26 ENCOUNTER — Other Ambulatory Visit: Payer: Self-pay

## 2021-01-26 DIAGNOSIS — D696 Thrombocytopenia, unspecified: Secondary | ICD-10-CM | POA: Diagnosis not present

## 2021-01-26 DIAGNOSIS — Z862 Personal history of diseases of the blood and blood-forming organs and certain disorders involving the immune mechanism: Secondary | ICD-10-CM | POA: Diagnosis not present

## 2021-01-26 DIAGNOSIS — D709 Neutropenia, unspecified: Secondary | ICD-10-CM | POA: Insufficient documentation

## 2021-01-26 DIAGNOSIS — D469 Myelodysplastic syndrome, unspecified: Secondary | ICD-10-CM | POA: Insufficient documentation

## 2021-01-26 DIAGNOSIS — R161 Splenomegaly, not elsewhere classified: Secondary | ICD-10-CM | POA: Diagnosis not present

## 2021-01-26 DIAGNOSIS — D61818 Other pancytopenia: Secondary | ICD-10-CM | POA: Diagnosis not present

## 2021-01-26 DIAGNOSIS — Z87891 Personal history of nicotine dependence: Secondary | ICD-10-CM | POA: Insufficient documentation

## 2021-01-26 DIAGNOSIS — K7469 Other cirrhosis of liver: Secondary | ICD-10-CM | POA: Diagnosis not present

## 2021-01-26 HISTORY — PX: IR BONE MARROW BIOPSY & ASPIRATION: IMG5727

## 2021-01-26 LAB — PATHOLOGIST SMEAR REVIEW

## 2021-01-26 LAB — CBC WITH DIFFERENTIAL/PLATELET
Abs Immature Granulocytes: 0.01 10*3/uL (ref 0.00–0.07)
Basophils Absolute: 0 10*3/uL (ref 0.0–0.1)
Basophils Relative: 2 %
Eosinophils Absolute: 0.1 10*3/uL (ref 0.0–0.5)
Eosinophils Relative: 3 %
HCT: 35.9 % — ABNORMAL LOW (ref 39.0–52.0)
Hemoglobin: 13.2 g/dL (ref 13.0–17.0)
Immature Granulocytes: 1 %
Lymphocytes Relative: 41 %
Lymphs Abs: 0.8 10*3/uL (ref 0.7–4.0)
MCH: 36.8 pg — ABNORMAL HIGH (ref 26.0–34.0)
MCHC: 36.8 g/dL — ABNORMAL HIGH (ref 30.0–36.0)
MCV: 100 fL (ref 80.0–100.0)
Monocytes Absolute: 0.2 10*3/uL (ref 0.1–1.0)
Monocytes Relative: 10 %
Neutro Abs: 0.9 10*3/uL — ABNORMAL LOW (ref 1.7–7.7)
Neutrophils Relative %: 43 %
Platelets: 42 10*3/uL — ABNORMAL LOW (ref 150–400)
RBC: 3.59 MIL/uL — ABNORMAL LOW (ref 4.22–5.81)
RDW: 14.2 % (ref 11.5–15.5)
Smear Review: DECREASED
WBC: 2 10*3/uL — ABNORMAL LOW (ref 4.0–10.5)
nRBC: 0 % (ref 0.0–0.2)

## 2021-01-26 LAB — GLUCOSE, CAPILLARY: Glucose-Capillary: 143 mg/dL — ABNORMAL HIGH (ref 70–99)

## 2021-01-26 IMAGING — XA IR BIOPSY AND ASPIRATION BONE MARROW
1 series · 1 of 1 positions shown · non-contrast
Comparison: none

CLINICAL DATA: Cirrhosis, splenomegaly, history of myelodysplastic
syndrome

EXAM:
FLUORO GUIDED DEEP ILIAC BONE ASPIRATION AND CORE BIOPSY
TECHNIQUE: Patient was placed prone on the fluoroscopy table in IR and an
appropriate approach was selected parallel to the SI joint on the
left.

[Series 1: interv standard · 1 of 1 slices shown]
[im 1/1]
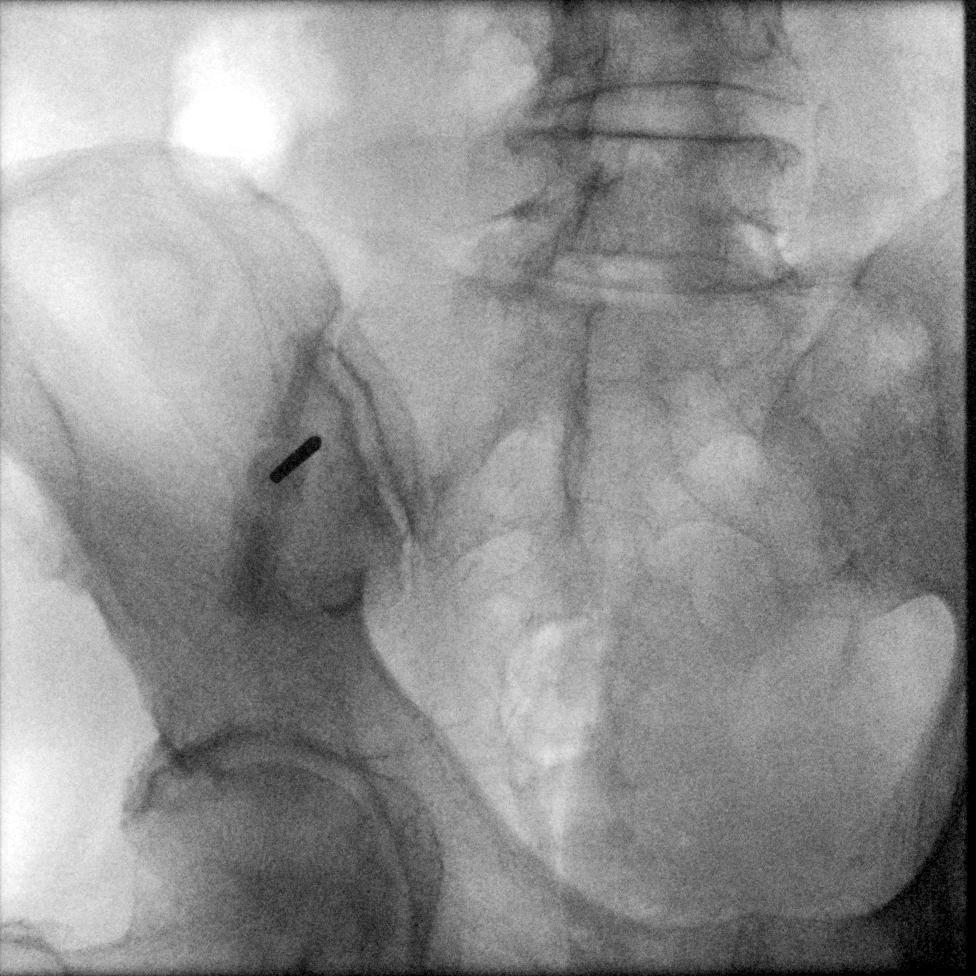

[1 of 1 positions shown; findings below may reference images not displayed]

Appropriate skin entry site was identified. Skin site was marked,
prepped with chlorhexidine, draped in usual sterile fashion, and
infiltrated locally with 1% lidocaine.

Intravenous Fentanyl [10] and Versed 1mg were administered as
conscious sedation during continuous monitoring of the patient's
level of consciousness and physiological / cardiorespiratory status
by the radiology RN, with a total moderate sedation time of 10
minutes.

Under fluoroscopic guidance an 11-gauge Cook trocar bone needle was
advanced into the left iliac bone just lateral to the sacroiliac
joint. Once needle tip position was confirmed, core and aspiration
samples were obtained, submitted to pathology for approval. Patient
tolerated procedure well.

COMPLICATIONS:
COMPLICATIONS
none
IMPRESSION: 1. Technically successful fluoroscopy guided left iliac bone core
and aspiration biopsy.

## 2021-01-26 MED ORDER — HYDROCODONE-ACETAMINOPHEN 5-325 MG PO TABS
1.0000 | ORAL_TABLET | ORAL | Status: DC | PRN
  Filled 2021-01-26: qty 2

## 2021-01-26 MED ORDER — HEPARIN SOD (PORK) LOCK FLUSH 100 UNIT/ML IV SOLN
INTRAVENOUS | Status: AC
  Filled 2021-01-26: qty 5

## 2021-01-26 MED ORDER — FENTANYL CITRATE (PF) 100 MCG/2ML IJ SOLN
INTRAMUSCULAR | Status: AC | PRN
Start: 2021-01-26 — End: 2021-01-26
  Administered 2021-01-26: 50 ug via INTRAVENOUS

## 2021-01-26 MED ORDER — SODIUM CHLORIDE 0.9 % IV SOLN: INTRAVENOUS | Status: DC

## 2021-01-26 MED ORDER — FENTANYL CITRATE (PF) 100 MCG/2ML IJ SOLN
INTRAMUSCULAR | Status: AC
  Filled 2021-01-26: qty 2

## 2021-01-26 MED ORDER — MIDAZOLAM HCL 2 MG/2ML IJ SOLN
INTRAMUSCULAR | Status: AC
  Filled 2021-01-26: qty 2

## 2021-01-26 MED ORDER — MIDAZOLAM HCL 2 MG/2ML IJ SOLN
INTRAMUSCULAR | Status: AC | PRN
  Administered 2021-01-26: 1 mg via INTRAVENOUS

## 2021-01-26 MED ORDER — LIDOCAINE HCL 1 % IJ SOLN
INTRAMUSCULAR | Status: AC
  Filled 2021-01-26: qty 20

## 2021-01-26 NOTE — H&P (Signed)
Chief Complaint: Patient was seen in consultation today for myelodysplastic syndrome at the request of Jasper C  Referring Physician(s): Rao,Archana C  Supervising Physician: Arne Cleveland  Patient Status: [facility] - Out-pt  History of Present Illness: Timothy Murray is a 76 y.o. male with pertinent PMHx for myelodysplastic syndrome who follows with Dr. Janese Banks and recently seen in the office on 1/16 with worsening neutropenia and thrombocytopenia and request received for image guided bone marrow biopsy with moderate sedation, previously tolerated bone marrow biopsy and sedation in 04/2020 with out service. Patient also with PMHx of [place] s/p radiation 06/2019.   The patient has had a H&P performed within the last 30 days, all history, medications, and exam have been reviewed. The patient denies any interval changes since the H&P.  The patient denies any current chest pain or shortness of breath. The patient denies any history of sleep apnea or chronic oxygen use. He has no known complications to sedation.   Past Medical History:  Diagnosis Date   Arthritis    "lower back" (10/24/2012)   Coronary artery disease    GERD (gastroesophageal reflux disease)    High cholesterol    Hypertension    Liver cancer ([place])    MDS (myelodysplastic syndrome) ([place])    Type II diabetes mellitus ([place])     Past Surgical History:  Procedure Laterality Date   APPENDECTOMY  2002   CARDIAC CATHETERIZATION  10/24/2012   CORONARY ARTERY BYPASS GRAFT N/A 10/26/2012   Procedure: CORONARY ARTERY BYPASS GRAFTING (CABG);  Surgeon: Melrose Nakayama, MD;  Location: [facility];  Service: Open Heart Surgery;  Laterality: N/A;  Times 3 using left internal mammary artery and endoscopically harvested right saphenous vein   ERCP N/A 04/25/2020   Procedure: ENDOSCOPIC RETROGRADE CHOLANGIOPANCREATOGRAPHY (ERCP);  Surgeon: Lucilla Lame, MD;  Location: [facility] ENDOSCOPY;  Service: Endoscopy;  Laterality: N/A;     Allergies: Dome-paste bandage [wound dressings], Soap & cleansers, and Latex  Medications: Prior to Admission medications   Medication Sig Start Date End Date Taking? Authorizing Provider  acyclovir (ZOVIRAX) 400 MG tablet TAKE 1 TABLET BY MOUTH TWICE A DAY 01/02/21  Yes Sindy Guadeloupe, MD  atorvastatin (LIPITOR) 40 MG tablet Take by mouth. 06/18/20  Yes [provider]  glimepiride (AMARYL) 1 MG tablet Take 1 mg by mouth daily.   Yes [provider]  levofloxacin (LEVAQUIN) 500 MG tablet TAKE 1 TABLET (500 MG TOTAL) BY MOUTH DAILY. 01/07/21  Yes Sindy Guadeloupe, MD  metoprolol tartrate (LOPRESSOR) 25 MG tablet Take 25 mg by mouth daily.   Yes [provider]  omeprazole (PRILOSEC) 40 MG capsule TAKE 1 CAPSULE BY MOUTH TWICE A DAY 11/30/20  Yes Sindy Guadeloupe, MD  oxyCODONE (OXY IR/ROXICODONE) 5 MG immediate release tablet Take 1 tablet (5 mg total) by mouth every 8 (eight) hours as needed for severe pain. 05/26/20  Yes Sindy Guadeloupe, MD  senna-docusate (SENOKOT-S) 8.6-50 MG tablet Take 1 tablet by mouth every morning.   Yes [provider]  traZODone (DESYREL) 50 MG tablet TAKE 1-2 TABLETS BY MOUTH AT BEDTIME. 07/28/20  Yes Sindy Guadeloupe, MD  gabapentin (NEURONTIN) 300 MG capsule Take 1 capsule (300 mg total) by mouth at bedtime. Patient not taking: Reported on 09/15/2020 07/02/20   Borders, Kirt Boys, NP  loratadine (CLARITIN) 10 MG tablet Take 5 mg by mouth daily as needed. Take half a tablet (57m) daily    [provider]  LORazepam (ATIVAN) 0.5 MG  tablet Take 1 tablet (0.5 mg total) by mouth every 6 (six) hours as needed (Nausea or vomiting). Patient not taking: Reported on 06/05/2020 05/26/20   Sindy Guadeloupe, MD  losartan (COZAAR) 25 MG tablet Take 25 mg by mouth daily. Patient not taking: Reported on 09/15/2020    [provider]  nitroGLYCERIN (NITROSTAT) 0.3 MG SL tablet nitroglycerin 0.3 mg sublingual tablet  PLACE ONE TABLET (0.3 MG  DOSE) UNDER THE TONGUE EVERY 5 (FIVE) MINUTES AS NEEDED FOR CHEST PAIN. Patient not taking: Reported on 06/23/2020    [provider]  ondansetron (ZOFRAN) 8 MG tablet Take 1 tablet (8 mg total) by mouth 2 (two) times daily as needed (Nausea or vomiting). Patient not taking: Reported on 06/05/2020 05/26/20   Sindy Guadeloupe, MD  predniSONE (DELTASONE) 20 MG tablet TAKE 1 TABLET BY MOUTH DAILY X 5 DAYS IN AM WITH FOOD Patient not taking: Reported on 09/15/2020 03/20/20   [provider]  prochlorperazine (COMPAZINE) 10 MG tablet Take 1 tablet (10 mg total) by mouth every 6 (six) hours as needed (Nausea or vomiting). Patient not taking: Reported on 09/15/2020 05/26/20   Sindy Guadeloupe, MD     Family History  Problem Relation Age of Onset   Cancer Sister     Social History   Socioeconomic History   Marital status: Married    Spouse name: Not on file   Number of children: Not on file   Years of education: Not on file   Highest education level: Not on file  Occupational History   Not on file  Tobacco Use   Smoking status: Former    Packs/day: 0.50    Years: 30.00    Pack years: 15.00    Types: Cigarettes    Quit date: 08/28/2001    Years since quitting: 19.4   Smokeless tobacco: Never  Vaping Use   Vaping Use: Never used  Substance and Sexual Activity   Alcohol use: No    Alcohol/week: 0.0 standard drinks    Comment: 10/24/2012 "stopped drinking alcohol in 2003; used to drink ~ 9 bottles beer/wk"   Drug use: No   Sexual activity: Yes  Other Topics Concern   Not on file  Social History Narrative   Not on file   Social Determinants of Health   Financial Resource Strain: Not on file  Food Insecurity: Not on file  Transportation Needs: Not on file  Physical Activity: Not on file  Stress: Not on file  Social Connections: Not on file   Review of Systems: A 12 point ROS discussed and pertinent positives are indicated in the HPI above.  All other systems are  negative.  Review of Systems  Vital Signs: BP 122/64    Pulse 63    Resp (!) 21    SpO2 99%   Physical Exam Constitutional:      Appearance: Normal appearance.  Cardiovascular:     Rate and Rhythm: Normal rate and regular rhythm.  Pulmonary:     Effort: Pulmonary effort is normal. No respiratory distress.  Skin:    General: Skin is warm and dry.  Neurological:     Mental Status: He is alert and oriented to person, place, and time.    Imaging: No results found.  Labs:  CBC: Recent Labs    11/17/20 0857 11/24/20 1355 12/22/20 0857 01/19/21 0914  WBC 1.5* 2.7* 2.5* 1.9*  HGB 14.2 14.4 14.2 13.8  HCT 40.0 41.3 40.0 38.7*  PLT 89*  80* 54* 36*    COAGS: Recent Labs    04/17/20 0804 04/24/20 1352  INR 1.2 1.1    BMP: Recent Labs    11/17/20 0857 11/24/20 1355 12/22/20 0857 01/19/21 0914  NA 138 135 136 136  K 4.1 4.4 4.0 4.1  CL 101 101 102 101  CO2 _0 GLUCOSE 231* 185* 249* 244*  BUN _1 CALCIUM 8.7* 8.2* 9.0 8.7*  CREATININE 0.98 1.12 1.04 0.91  GFRNONAA >60 >60 >60 >60    LIVER FUNCTION TESTS: Recent Labs    11/17/20 0857 11/24/20 1355 12/22/20 0857 01/19/21 0914  BILITOT 1.6* 1.0 1.4* 1.7*  AST 32 35 30 25  ALT 30 34 32 22  ALKPHOS 60 65 63 51  PROT 6.9 6.6 6.9 6.6  ALBUMIN 4.0 4.0 4.2 4.3    Assessment and Plan: 76 year old male with pertinent PMHx for myelodysplastic syndrome who follows with Dr. Janese Banks and recently seen in the office on 1/16 with worsening neutropenia and thrombocytopenia and request received for image guided bone marrow biopsy with moderate sedation, previously tolerated bone marrow biopsy and sedation in 04/2020 with out service.   The patient has been NPO, no blood thinners taken, labs and vitals have been reviewed.  Risks and benefits of image guided bone marrow biopsy with moderate sedation was discussed with the patient and/or patient's family including, but not limited to bleeding, infection,  damage to adjacent structures or low yield requiring additional tests.  All of the questions were answered and there is agreement to proceed.  Consent signed and in chart.  Thank you for this interesting consult.  I greatly enjoyed meeting [facility] and look forward to participating in their care.  A copy of this report was sent to the requesting provider on this date.  Electronically Signed: Hedy Jacob, PA-C 01/26/2021, 10:30 AM   I spent a total of 10 Minutes in face to face in clinical consultation, greater than 50% of which was counseling/coordinating care for myelodysplastic syndrome.

## 2021-01-27 ENCOUNTER — Other Ambulatory Visit: Payer: Self-pay | Admitting: Oncology

## 2021-01-29 ENCOUNTER — Other Ambulatory Visit: Payer: Self-pay | Admitting: Oncology

## 2021-01-30 NOTE — Progress Notes (Signed)
Blood glucose checked for CT-guided bone marrow biopsy per radiology protocol Dr. Randa Evens, MD, MPH [facility] at [facility] Pager- [phone] 01/30/2021 10:30 AM

## 2021-02-02 ENCOUNTER — Inpatient Hospital Stay: Payer: Medicare HMO

## 2021-02-02 ENCOUNTER — Encounter: Payer: Self-pay | Admitting: Oncology

## 2021-02-02 ENCOUNTER — Inpatient Hospital Stay (HOSPITAL_BASED_OUTPATIENT_CLINIC_OR_DEPARTMENT_OTHER): Payer: Medicare HMO | Admitting: Oncology

## 2021-02-02 ENCOUNTER — Inpatient Hospital Stay: Payer: Medicare HMO | Admitting: Pharmacist

## 2021-02-02 ENCOUNTER — Telehealth: Payer: Self-pay | Admitting: Pharmacy Technician

## 2021-02-02 ENCOUNTER — Other Ambulatory Visit: Payer: Self-pay

## 2021-02-02 ENCOUNTER — Telehealth: Payer: Self-pay | Admitting: *Deleted

## 2021-02-02 VITALS — BP 127/64 | HR 83 | Temp 98.8°F | Resp 16 | Ht 72.0 in | Wt 202.8 lb

## 2021-02-02 DIAGNOSIS — D469 Myelodysplastic syndrome, unspecified: Secondary | ICD-10-CM

## 2021-02-02 DIAGNOSIS — Z8505 Personal history of malignant neoplasm of liver: Secondary | ICD-10-CM | POA: Diagnosis not present

## 2021-02-02 DIAGNOSIS — D696 Thrombocytopenia, unspecified: Secondary | ICD-10-CM | POA: Diagnosis not present

## 2021-02-02 DIAGNOSIS — Z7189 Other specified counseling: Secondary | ICD-10-CM | POA: Diagnosis not present

## 2021-02-02 DIAGNOSIS — D708 Other neutropenia: Secondary | ICD-10-CM | POA: Diagnosis not present

## 2021-02-02 DIAGNOSIS — D46C Myelodysplastic syndrome with isolated del(5q) chromosomal abnormality: Secondary | ICD-10-CM | POA: Diagnosis not present

## 2021-02-02 LAB — COMPREHENSIVE METABOLIC PANEL
ALT: 29 U/L (ref 0–44)
AST: 30 U/L (ref 15–41)
Albumin: 4.3 g/dL (ref 3.5–5.0)
Alkaline Phosphatase: 54 U/L (ref 38–126)
Anion gap: 7 (ref 5–15)
BUN: 16 mg/dL (ref 8–23)
CO2: 30 mmol/L (ref 22–32)
Calcium: 8.9 mg/dL (ref 8.9–10.3)
Chloride: 99 mmol/L (ref 98–111)
Creatinine, Ser: 1.02 mg/dL (ref 0.61–1.24)
GFR, Estimated: >60 mL/min (ref >60)
Glucose, Bld: 269 mg/dL — ABNORMAL HIGH (ref 70–99)
Potassium: 3.9 mmol/L (ref 3.5–5.1)
Sodium: 136 mmol/L (ref 135–145)
Total Bilirubin: 2 mg/dL — ABNORMAL HIGH (ref 0.3–1.2)
Total Protein: 6.8 g/dL (ref 6.5–8.1)

## 2021-02-02 LAB — CBC WITH DIFFERENTIAL/PLATELET
Abs Immature Granulocytes: 0.01 10*3/uL (ref 0.00–0.07)
Basophils Absolute: 0 10*3/uL (ref 0.0–0.1)
Basophils Relative: 0 %
Eosinophils Absolute: 0.2 10*3/uL (ref 0.0–0.5)
Eosinophils Relative: 6 %
HCT: 37.6 % — ABNORMAL LOW (ref 39.0–52.0)
Hemoglobin: 13.6 g/dL (ref 13.0–17.0)
Immature Granulocytes: 0 %
Lymphocytes Relative: 35 %
Lymphs Abs: 0.9 10*3/uL (ref 0.7–4.0)
MCH: 36.4 pg — ABNORMAL HIGH (ref 26.0–34.0)
MCHC: 36.2 g/dL — ABNORMAL HIGH (ref 30.0–36.0)
MCV: 100.5 fL — ABNORMAL HIGH (ref 80.0–100.0)
Monocytes Absolute: 0.2 10*3/uL (ref 0.1–1.0)
Monocytes Relative: 7 %
Neutro Abs: 1.3 10*3/uL — ABNORMAL LOW (ref 1.7–7.7)
Neutrophils Relative %: 52 %
Platelets: 30 10*3/uL — ABNORMAL LOW (ref 150–400)
RBC: 3.74 MIL/uL — ABNORMAL LOW (ref 4.22–5.81)
RDW: 14.3 % (ref 11.5–15.5)
WBC: 2.5 10*3/uL — ABNORMAL LOW (ref 4.0–10.5)
nRBC: 0 % (ref 0.0–0.2)

## 2021-02-02 MED ORDER — ALLOPURINOL 300 MG PO TABS: 300.0000 mg | ORAL_TABLET | Freq: Every day | ORAL | 3 refills | Status: DC

## 2021-02-02 NOTE — Progress Notes (Signed)
Hematology/Oncology Consult note [facility]  Telephone:([phone] [phone]  Patient Care Team: Perrin Maltese, MD as PCP - General (Internal Medicine) Dionisio David, MD (Cardiology) Sindy Guadeloupe, MD as Consulting Physician (Hematology and Oncology)   Name of the patient: Timothy Murray  [id number]  Jul 09, 1945   Date of visit: 02/02/21  Diagnosis- MDS with 5q del but high risk given TP53 mutation 2. [place] s/p thermal ablation    Chief complaint/ Reason for visit-discuss bone marrow biopsy results and further management  Heme/Onc history: Patient is a 76 year old male with a history of cirrhosis and hepatocellular carcinoma for which she had proton beam radiation In June 2021.  He has baseline thrombocytopenia which was initially attributed to his cirrhosis.  He was found to have progressive pancytopenia with a steady decline in his white cell count since March 2022 when it was down to 3.4 with a platelet count of 53.  He was hospitalized sometime in April 2022 with acute biliary gallstone pancreatitis .  He underwent ERCP inpatient with platelet transfusion which showed sludge and biliary sphincterotomy was performed.  His bilirubin which was elevated up to 7.2 has come down to 2.4 presently.   However his pancytopenia has continued to worsen and presently white count is 1.1 with an H&H of 6.2/17.6 with an MCV of 102 and a platelet count of 33.Bone marrow biopsy showed evidence of dyspoietic changes in the erythroid and megakaryocyte excel lines associated with ring sideroblasts.  Definite increase in blastic cells not identified.  Overall features favor myelodysplastic syndrome.  Karyotype was normal but only for metaphase cells were analyzed which were not sufficient.  [facility] FISH MDS panel showed deletion of 5 q. along with monosomy 7 and deletion 7 q. without excess blasts.  SF3B1 mutation negative.   Peripheral blood intellegin myeloid  panel showed :   Detected     Change       Frequency (%) Impact  TP53   c.716A>C     p.Asn239Thr  19            Tier I  U2AF1  c.470A>C     p.Gln157Pro  17            Tier I  IDH1   c.315C>T     p.Gly105Gly  75            Tier II      With regards to his hepatocellular carcinoma patient had an MRI abdomen on 04/24/2020 which showed that his primary liver mass which was previously 4.3 x 3.6 cmWas similar in size at 4.5 x 3.5 cm.    Patient was briefly on Revlimid for 5 q. deletion but after T p53 came back he was switched to [place] for 5 days every 28 days starting 05/27/2020    Patient was seen by Dr. Marvel Plan at [facility] for second opinion and he also had a repeat bone marrow biopsyDone on 08/14/2020.  Biopsy showed normocellular bone marrow with erythroid predominant trilineage hematopoiesis.  Megakaryocytes were typical in number with few hypolobated forms.  Full sequence of orderly maturation noted in both granulopoiesis and erythropoiesis.  3% blasts were seen by manual differential.   Plan is to continue Vidaza until progression or toxicity which was started in May 2022  Interval history-patient reports feeling at his baseline state of health.  He has some fatigue but denies other complaints.  Denies any bleeding or bruising.  Taking the Levaquin and acyclovir prophylaxis.  Denies any recurrent infections  ECOG PS- 1 Pain scale- 0   Review of systems- Review of Systems  Constitutional:  Negative for chills, fever, malaise/fatigue and weight loss.  HENT:  Negative for congestion, ear discharge and nosebleeds.   Eyes:  Negative for blurred vision.  Respiratory:  Negative for cough, hemoptysis, sputum production, shortness of breath and wheezing.   Cardiovascular:  Negative for chest pain, palpitations, orthopnea and claudication.  Gastrointestinal:  Negative for abdominal pain, blood in stool, constipation, diarrhea, heartburn, melena, nausea and vomiting.  Genitourinary:  Negative for  dysuria, flank pain, frequency, hematuria and urgency.  Musculoskeletal:  Negative for back pain, joint pain and myalgias.  Skin:  Negative for rash.  Neurological:  Negative for dizziness, tingling, focal weakness, seizures, weakness and headaches.  Endo/Heme/Allergies:  Does not bruise/bleed easily.  Psychiatric/Behavioral:  Negative for depression and suicidal ideas. The patient does not have insomnia.       Allergies  Allergen Reactions   Dome-Paste Bandage [Wound Dressings]     All bandaids cause a rash   Soap & Cleansers     "liquid soap" antibacterial -rash   Latex Rash and Itching     Past Medical History:  Diagnosis Date   Arthritis    "lower back" (10/24/2012)   Coronary artery disease    GERD (gastroesophageal reflux disease)    High cholesterol    Hypertension    Liver cancer ([place])    MDS (myelodysplastic syndrome) ([place])    Type II diabetes mellitus ([place])      Past Surgical History:  Procedure Laterality Date   APPENDECTOMY  2002   CARDIAC CATHETERIZATION  10/24/2012   CORONARY ARTERY BYPASS GRAFT N/A 10/26/2012   Procedure: CORONARY ARTERY BYPASS GRAFTING (CABG);  Surgeon: Melrose Nakayama, MD;  Location: [place];  Service: Open Heart Surgery;  Laterality: N/A;  Times 3 using left internal mammary artery and endoscopically harvested right saphenous vein   ERCP N/A 04/25/2020   Procedure: ENDOSCOPIC RETROGRADE CHOLANGIOPANCREATOGRAPHY (ERCP);  Surgeon: Lucilla Lame, MD;  Location: [facility] ENDOSCOPY;  Service: Endoscopy;  Laterality: N/A;   IR BONE MARROW BIOPSY & ASPIRATION  01/26/2021    Social History   Socioeconomic History   Marital status: Married    Spouse name: Not on file   Number of children: Not on file   Years of education: Not on file   Highest education level: Not on file  Occupational History   Not on file  Tobacco Use   Smoking status: Former    Packs/day: 0.50    Years: 30.00    Pack years: 15.00    Types: Cigarettes    Quit date:  08/28/2001    Years since quitting: 19.4   Smokeless tobacco: Never  Vaping Use   Vaping Use: Never used  Substance and Sexual Activity   Alcohol use: No    Alcohol/week: 0.0 standard drinks    Comment: 10/24/2012 "stopped drinking alcohol in 2003; used to drink ~ 9 bottles beer/wk"   Drug use: No   Sexual activity: Yes  Other Topics Concern   Not on file  Social History Narrative   Not on file   Social Determinants of Health   Financial Resource Strain: Not on file  Food Insecurity: Not on file  Transportation Needs: Not on file  Physical Activity: Not on file  Stress: Not on file  Social Connections: Not on file  Intimate Partner Violence: Not on file    Family History  Problem Relation  Age of Onset   Cancer Sister      Current Outpatient Medications:    acyclovir (ZOVIRAX) 400 MG tablet, TAKE 1 TABLET BY MOUTH TWICE A DAY, Disp: 60 tablet, Rfl: 1   atorvastatin (LIPITOR) 40 MG tablet, Take by mouth., Disp: , Rfl:    glimepiride (AMARYL) 1 MG tablet, Take 1 mg by mouth daily., Disp: , Rfl:    levofloxacin (LEVAQUIN) 500 MG tablet, TAKE 1 TABLET (500 MG TOTAL) BY MOUTH DAILY., Disp: 30 tablet, Rfl: 0   loratadine (CLARITIN) 10 MG tablet, Take 5 mg by mouth daily as needed. Take half a tablet (19m) daily, Disp: , Rfl:    metoprolol tartrate (LOPRESSOR) 25 MG tablet, Take 25 mg by mouth daily., Disp: , Rfl:    omeprazole (PRILOSEC) 40 MG capsule, TAKE 1 CAPSULE BY MOUTH TWICE A DAY, Disp: 180 capsule, Rfl: 1   oxyCODONE (OXY IR/ROXICODONE) 5 MG immediate release tablet, Take 1 tablet (5 mg total) by mouth every 8 (eight) hours as needed for severe pain., Disp: 90 tablet, Rfl: 0   senna-docusate (SENOKOT-S) 8.6-50 MG tablet, Take 1 tablet by mouth every morning., Disp: , Rfl:    traZODone (DESYREL) 50 MG tablet, TAKE 1 TO 2 TABLETS BY MOUTH AT BEDTIME, Disp: 180 tablet, Rfl: 1   gabapentin (NEURONTIN) 300 MG capsule, Take 1 capsule (300 mg total) by mouth at bedtime. (Patient  not taking: Reported on 09/15/2020), Disp: 30 capsule, Rfl: 0   LORazepam (ATIVAN) 0.5 MG tablet, Take 1 tablet (0.5 mg total) by mouth every 6 (six) hours as needed (Nausea or vomiting). (Patient not taking: Reported on 06/05/2020), Disp: 30 tablet, Rfl: 0   losartan (COZAAR) 25 MG tablet, Take 25 mg by mouth daily. (Patient not taking: Reported on 09/15/2020), Disp: , Rfl:    nitroGLYCERIN (NITROSTAT) 0.3 MG SL tablet, nitroglycerin 0.3 mg sublingual tablet  PLACE ONE TABLET (0.3 MG DOSE) UNDER THE TONGUE EVERY 5 (FIVE) MINUTES AS NEEDED FOR CHEST PAIN. (Patient not taking: Reported on 06/23/2020), Disp: , Rfl:    ondansetron (ZOFRAN) 8 MG tablet, Take 1 tablet (8 mg total) by mouth 2 (two) times daily as needed (Nausea or vomiting). (Patient not taking: Reported on 06/05/2020), Disp: 30 tablet, Rfl: 1   predniSONE (DELTASONE) 20 MG tablet, TAKE 1 TABLET BY MOUTH DAILY X 5 DAYS IN AM WITH FOOD (Patient not taking: Reported on 09/15/2020), Disp: , Rfl:    prochlorperazine (COMPAZINE) 10 MG tablet, Take 1 tablet (10 mg total) by mouth every 6 (six) hours as needed (Nausea or vomiting). (Patient not taking: Reported on 09/15/2020), Disp: 30 tablet, Rfl: 1  Physical exam:  Vitals:   02/02/21 1013 02/02/21 1016  BP: 127/64   Pulse: 83   Resp: 16   Temp: 98.8 F (37.1 C) 98.8 F (37.1 C)  TempSrc: Tympanic Tympanic  SpO2: 99%   Weight: 202 lb 12.8 oz (92 kg)   Height: 6' (1.829 m)    Physical Exam Cardiovascular:     Rate and Rhythm: Normal rate and regular rhythm.     Heart sounds: Normal heart sounds.  Pulmonary:     Effort: Pulmonary effort is normal.     Breath sounds: Normal breath sounds.  Abdominal:     General: Bowel sounds are normal.     Palpations: Abdomen is soft.  Skin:    General: Skin is warm and dry.  Neurological:     Mental Status: He is alert and oriented to person, place, and  time.     CMP Latest Ref Rng & Units 02/02/2021  Glucose 70 - 99 mg/dL 269(H)  BUN 8 - 23 mg/dL  16  Creatinine 0.61 - 1.24 mg/dL 1.02  Sodium 135 - 145 mmol/L 136  Potassium 3.5 - 5.1 mmol/L 3.9  Chloride 98 - 111 mmol/L 99  CO2 22 - 32 mmol/L 30  Calcium 8.9 - 10.3 mg/dL 8.9  Total Protein 6.5 - 8.1 g/dL 6.8  Total Bilirubin 0.3 - 1.2 mg/dL 2.0(H)  Alkaline Phos 38 - 126 U/L 54  AST 15 - 41 U/L 30  ALT 0 - 44 U/L 29   CBC Latest Ref Rng & Units 02/02/2021  WBC 4.0 - 10.5 K/uL 2.5(L)  Hemoglobin 13.0 - 17.0 g/dL 13.6  Hematocrit 39.0 - 52.0 % 37.6(L)  Platelets 150 - 400 K/uL 30(L)    No images are attached to the encounter.  IR BONE MARROW BIOPSY & ASPIRATION  Result Date: 01/26/2021 CLINICAL DATA:  Cirrhosis, splenomegaly, history of myelodysplastic syndrome EXAM: FLUORO GUIDED DEEP ILIAC BONE ASPIRATION AND CORE BIOPSY TECHNIQUE: Patient was placed prone on the fluoroscopy table in IR and an appropriate approach was selected parallel to the SI joint on the left. Appropriate skin entry site was identified. Skin site was marked, prepped with chlorhexidine, draped in usual sterile fashion, and infiltrated locally with 1% lidocaine. Intravenous Fentanyl 53mg and Versed 17mwere administered as conscious sedation during continuous monitoring of the patient's level of consciousness and physiological / cardiorespiratory status by the radiology RN, with a total moderate sedation time of 10 minutes. Under fluoroscopic guidance an 11-gauge Cook trocar bone needle was advanced into the left iliac bone just lateral to the sacroiliac joint. Once needle tip position was confirmed, core and aspiration samples were obtained, submitted to pathology for approval. Patient tolerated procedure well. COMPLICATIONS: COMPLICATIONS none IMPRESSION: 1. Technically successful fluoroscopy guided left iliac bone core and aspiration biopsy. Electronically Signed   By: D Lucrezia Europe.D.   On: 01/26/2021 13:26     Assessment and plan- Patient is a [age].o. male high risk MDS s/p 8 cycles of Vidaza here to discuss  bone marrow biopsy results and further management  Discussed the results of bone marrow biopsyWhich does not show any evidence of transformation to acute leukemia or increase in the number of blasts.  However his present bone marrow looks similar to his original bone marrow that he had at the time of diagnosis which was consistent with dysplasia involving the erythroid precursors as well as hypogranular and abnormal lobation noted in granulocyte precursors.  Patient's platelet count at the time of initial diagnosis had gone down to 7 and then improved to the 100s but is down again to the 30s.  Hemoglobin has surprisingly remained at 13.  Admission diagnosis his hemoglobin was down to 6.  He has ongoing leukopenia and mild neutropenia.  However given his worsening platelet counts and concerned that he is not responding to single agent Vidaza.  I therefore recommend adding venetoclax to his regimen.  [facility] see if he can see him sooner for any other second line options or clinical trials.  Tentatively have scheduled him to restart while he is at full dose 75 mg per metered squared day 1 to day 5 instead of day 1 today 7.  Starting day 2 he will be receiving venetoclax with a ramp-up dosing of 100 mg followed by 200 mg on day  2 and 400 mg on day 3.  He will receive IV fluids for TLS prophylaxis from day 2 to day 5.  We will start him on allopurinol 300 mg daily.  We will obtain baseline tumor lysis syndrome labs including CMP phosphorus and uric acid next week.  Discussed risks and benefits of venetoclax including all but not limited to nausea vomiting diarrhea, cytopenias and increasing risk of infections.  Treatment will be given with a palliative intent.  Patient understands and agrees to proceed as planned   Visit Diagnosis 1. MDS (myelodysplastic syndrome) ([place])   2. Goals of care, counseling/discussion      Dr. Randa Evens, MD, MPH [facility] at [facility] [phone] 02/02/2021 12:56 PM

## 2021-02-02 NOTE — Telephone Encounter (Signed)
Called [facility] and to Dr. Marvel Plan. We need a stat appt if we can the pt hgb is good and the plt. Are going low. Dr Janese Banks feels that vidaza is not working and would like to add on venetoclax. Dr. Janese Banks would like to see pt and determine if that if it is good treatment or he has any other second line options including clinical trials. I was sent to scheduler and got voicemail and left all the above info on the above. Left my direct phone number to call back

## 2021-02-02 NOTE — Telephone Encounter (Signed)
Oral Oncology Patient Advocate Encounter   Received notification from [facility] that prior authorization for Venclexta is required.   PA submitted on CoverMyMeds Key [id number] Status is pending   Oral Oncology Clinic will continue to follow.  Merrill Patient [place] Phone [phone] Fax [phone] 02/02/2021 12:25 PM

## 2021-02-02 NOTE — Progress Notes (Signed)
[place]  Telephone:([phone] Fax:([phone]  Patient Care Team: Perrin Maltese, MD as PCP - General (Internal Medicine) Dionisio David, MD (Cardiology) Sindy Guadeloupe, MD as Consulting Physician (Hematology and Oncology)   Name of the patient: Timothy Murray  [id number]  27-Sep-1945   Date of visit: 02/02/21  HPI: Patient is a 76 y.o. male with MDA, now found to have high-rsik MDS on repeat bone marrow biopsy. Plan to add venetoclax to treatment regimen of azacitidine.   Reason for Consult: Venclexta (venetoclax)  oral chemotherapy education.   PAST MEDICAL HISTORY: Past Medical History:  Diagnosis Date   Arthritis    "lower back" (10/24/2012)   Coronary artery disease    GERD (gastroesophageal reflux disease)    High cholesterol    Hypertension    Liver cancer ([place])    MDS (myelodysplastic syndrome) (HCC)    Type II diabetes mellitus ([place])     HEMATOLOGY/ONCOLOGY HISTORY:  Oncology History  MDS (myelodysplastic syndrome) ([place])  05/26/2020 Initial Diagnosis   MDS (myelodysplastic syndrome) ([place])   05/27/2020 -  Chemotherapy   Patient is on Treatment Plan : MYELODYSPLASIA  Azacitidine SQ D1-7 q28d       ALLERGIES:  is allergic to dome-paste bandage [wound dressings], soap & cleansers, and latex.  MEDICATIONS:  Current Outpatient Medications  Medication Sig Dispense Refill   acyclovir (ZOVIRAX) 400 MG tablet TAKE 1 TABLET BY MOUTH TWICE A DAY 60 tablet 1   atorvastatin (LIPITOR) 40 MG tablet Take by mouth.     gabapentin (NEURONTIN) 300 MG capsule Take 1 capsule (300 mg total) by mouth at bedtime. (Patient not taking: Reported on 09/15/2020) 30 capsule 0   glimepiride (AMARYL) 1 MG tablet Take 1 mg by mouth daily.     levofloxacin (LEVAQUIN) 500 MG tablet TAKE 1 TABLET (500 MG TOTAL) BY MOUTH DAILY. 30 tablet 0   loratadine (CLARITIN) 10 MG tablet Take 5 mg by mouth daily as needed. Take half a tablet (7m) daily      LORazepam (ATIVAN) 0.5 MG tablet Take 1 tablet (0.5 mg total) by mouth every 6 (six) hours as needed (Nausea or vomiting). (Patient not taking: Reported on 06/05/2020) 30 tablet 0   losartan (COZAAR) 25 MG tablet Take 25 mg by mouth daily. (Patient not taking: Reported on 09/15/2020)     metoprolol tartrate (LOPRESSOR) 25 MG tablet Take 25 mg by mouth daily.     nitroGLYCERIN (NITROSTAT) 0.3 MG SL tablet nitroglycerin 0.3 mg sublingual tablet  PLACE ONE TABLET (0.3 MG DOSE) UNDER THE TONGUE EVERY 5 (FIVE) MINUTES AS NEEDED FOR CHEST PAIN. (Patient not taking: Reported on 06/23/2020)     omeprazole (PRILOSEC) 40 MG capsule TAKE 1 CAPSULE BY MOUTH TWICE A DAY 180 capsule 1   ondansetron (ZOFRAN) 8 MG tablet Take 1 tablet (8 mg total) by mouth 2 (two) times daily as needed (Nausea or vomiting). (Patient not taking: Reported on 06/05/2020) 30 tablet 1   oxyCODONE (OXY IR/ROXICODONE) 5 MG immediate release tablet Take 1 tablet (5 mg total) by mouth every 8 (eight) hours as needed for severe pain. 90 tablet 0   predniSONE (DELTASONE) 20 MG tablet TAKE 1 TABLET BY MOUTH DAILY X 5 DAYS IN AM WITH FOOD (Patient not taking: Reported on 09/15/2020)     prochlorperazine (COMPAZINE) 10 MG tablet Take 1 tablet (10 mg total) by mouth every 6 (six) hours as needed (Nausea or vomiting). (Patient not taking: Reported on 09/15/2020)  30 tablet 1   senna-docusate (SENOKOT-S) 8.6-50 MG tablet Take 1 tablet by mouth every morning.     traZODone (DESYREL) 50 MG tablet TAKE 1 TO 2 TABLETS BY MOUTH AT BEDTIME 180 tablet 1   No current facility-administered medications for this visit.    VITAL SIGNS: There were no vitals taken for this visit. There were no vitals filed for this visit.  Estimated body mass index is 27.5 kg/m as calculated from the following:   Height as of an earlier encounter on 02/02/21: 6' (1.829 m).   Weight as of an earlier encounter on 02/02/21: 92 kg (202 lb 12.8 oz).  LABS: CBC:    Component Value  Date/Time   WBC 2.5 (L) 02/02/2021 0939   HGB 13.6 02/02/2021 0939   HGB 15.6 10/24/2012 1224   HCT 37.6 (L) 02/02/2021 0939   HCT 43.8 10/24/2012 1224   PLT 30 (L) 02/02/2021 0939   PLT 165 10/24/2012 1224   MCV 100.5 (H) 02/02/2021 0939   MCV 91 10/24/2012 1224   NEUTROABS 1.3 (L) 02/02/2021 0939   NEUTROABS 4.8 10/24/2012 1224   LYMPHSABS 0.9 02/02/2021 0939   LYMPHSABS 1.8 10/24/2012 1224   MONOABS 0.2 02/02/2021 0939   MONOABS 0.5 10/24/2012 1224   EOSABS 0.2 02/02/2021 0939   EOSABS 0.1 10/24/2012 1224   BASOSABS 0.0 02/02/2021 0939   BASOSABS 0.1 10/24/2012 1224   Comprehensive Metabolic Panel:    Component Value Date/Time   NA 136 02/02/2021 0939   NA 138 10/24/2012 1224   K 3.9 02/02/2021 0939   K 3.9 10/24/2012 1224   CL 99 02/02/2021 0939   CL 106 10/24/2012 1224   CO2 30 02/02/2021 0939   CO2 27 10/24/2012 1224   BUN 16 02/02/2021 0939   BUN 15 10/24/2012 1224   CREATININE 1.02 02/02/2021 0939   CREATININE 1.01 10/24/2012 1224   GLUCOSE 269 (H) 02/02/2021 0939   GLUCOSE 115 (H) 10/24/2012 1224   CALCIUM 8.9 02/02/2021 0939   CALCIUM 8.9 10/24/2012 1224   AST 30 02/02/2021 0939   AST 29 10/24/2012 1224   ALT 29 02/02/2021 0939   ALT 43 10/24/2012 1224   ALKPHOS 54 02/02/2021 0939   ALKPHOS 60 10/24/2012 1224   BILITOT 2.0 (H) 02/02/2021 0939   BILITOT 0.7 10/24/2012 1224   PROT 6.8 02/02/2021 0939   PROT 7.4 10/24/2012 1224   ALBUMIN 4.3 02/02/2021 0939   ALBUMIN 3.7 10/24/2012 1224     Present during today's visit: Patient and his wife  Start plan: Patient will return to clinic next week for venetoclax initiation and continuation of azacitidine   CBC/CMP from 02/02/21 assessed, no relevant lab abnormalities. Prescription dose and frequency assessed. TLS labs will be monitored with venetoclax initiation and hydration will be provided.   Patient currently taking acyclovir and levofloxacin, will monitor for the need to begin posaconazole (which  would require a dose decrease of venetoclax).   Patient Education I spoke with patient for overview of new oral chemotherapy medication: venetoclax   Administration: Counseled patient on administration, dosing, side effects, monitoring, drug-food interactions, safe handling, storage, and disposal. Patient will take 1 tablet (100 mg total) by mouth daily for 1 day, THEN 2 tablets (200 mg total) daily for 1 day, THEN 4 tablets (400 mg total) daily for 12 days. Hold for 14 days. Take with food.  After dose escalation, his dose will be 430m daily, 14 days on/14 days off.  Side Effects: Side effects include but  not limited to: TLS, nausea, diarrhea, fatigue, decreased wbc/hgb/plt.    Drug-drug Interactions (DDI): Glimepiride: Hyperglycemia-associated agents like venetoclax may diminish the therapeutic effect of antidiabetic agents. Monitor for a decrease in bllod glucose control. No baseline dose adjustment needed.  Adherence: After discussion with patient no patient barriers to medication adherence identified.  Reviewed with patient importance of keeping a medication schedule and plan for any missed doses.  Mr. [place] voiced understanding and appreciation. All questions answered. Medication handout provided.  Provided patient with [facility] phone number. Patient knows to call the office with questions or concerns. Oral Chemotherapy Navigation Clinic will continue to follow.  Patient expressed understanding and was in agreement with this plan. He also understands that He can call clinic at any time with any questions, concerns, or complaints.   Medication Access Issues: No issues, approved by insurance and grants obtained  Follow-up plan: RTC next week for treatment initation  Thank you for allowing me to participate in the care of this patient.   Time Total: 20 mins  Visit consisted of counseling and education on dealing with issues of symptom management in  the setting of serious and potentially life-threatening illness.Greater than 50%  of this time was spent counseling and coordinating care related to the above assessment and plan.  Signed by: Darl Pikes, PharmD, BCPS, Salley Slaughter, CPP Hematology/Oncology Clinical Pharmacist Practitioner Secor/DB/AP [facility] [phone]  02/02/2021 11:45 AM

## 2021-02-03 ENCOUNTER — Other Ambulatory Visit: Payer: Self-pay | Admitting: Oncology

## 2021-02-03 ENCOUNTER — Telehealth: Payer: Self-pay | Admitting: Pharmacy Technician

## 2021-02-03 ENCOUNTER — Encounter: Payer: Self-pay | Admitting: Oncology

## 2021-02-03 ENCOUNTER — Other Ambulatory Visit (HOSPITAL_COMMUNITY): Payer: Self-pay

## 2021-02-03 DIAGNOSIS — D469 Myelodysplastic syndrome, unspecified: Secondary | ICD-10-CM

## 2021-02-03 MED ORDER — VENETOCLAX 100 MG PO TABS
ORAL_TABLET | ORAL | 0 refills | Status: DC
  Filled 2021-02-03: qty 51, 14d supply, fill #0

## 2021-02-03 NOTE — Telephone Encounter (Signed)
Oral Oncology Patient Advocate Encounter  Prior Authorization for Timothy Murray has been approved.    PA# [id number] Effective dates: 01/04/21 through 01/03/22  Patients co-pay is $3312.19.  Oral Oncology Clinic will continue to follow.   Tool Patient [place] Phone [phone] Fax [phone] 02/03/2021 8:56 AM

## 2021-02-04 ENCOUNTER — Other Ambulatory Visit (HOSPITAL_COMMUNITY): Payer: Self-pay

## 2021-02-04 ENCOUNTER — Encounter (HOSPITAL_COMMUNITY): Payer: Self-pay | Admitting: Oncology

## 2021-02-04 ENCOUNTER — Telehealth: Payer: Self-pay | Admitting: *Deleted

## 2021-02-04 NOTE — Telephone Encounter (Signed)
Called pt and left message about Dr. Janese Banks wanting me to tell pt that we are just giving vidaza only. Dr. Janese Banks spoke to Dr. Marvel Plan and he does not feel that pt needs to add on venteclax. Dr Janese Banks will talk in depth to pt and wants pt to come in 8:15 to get labs and then see dr Janese Banks. He called back in 5 min and let me know he is ok and will be there 8:15 monday

## 2021-02-05 ENCOUNTER — Telehealth: Payer: Self-pay | Admitting: Pharmacist

## 2021-02-05 ENCOUNTER — Encounter: Payer: Self-pay | Admitting: Pharmacy Technician

## 2021-02-05 NOTE — Telephone Encounter (Signed)
Oral Oncology Pharmacist Encounter   Received notification from [place] that prior authorization for Promacta is required.   PA submitted on CMM Key [id number] Status is pending   Oral Oncology Clinic will continue to follow.   Darl Pikes, PharmD, BCPS, [facility] Hematology/Oncology Clinical Pharmacist [facility] [place] [facility] [phone]  02/05/2021 10:39 AM

## 2021-02-05 NOTE — Telephone Encounter (Signed)
Opened in error

## 2021-02-06 ENCOUNTER — Telehealth: Payer: Self-pay | Admitting: *Deleted

## 2021-02-06 LAB — SURGICAL PATHOLOGY

## 2021-02-06 NOTE — Telephone Encounter (Signed)
Promacta PA denied. Appeal submitted on 02/06/21 via fax.

## 2021-02-06 NOTE — Telephone Encounter (Signed)
Spoke with patient. Patient called to clarify if his 26 apt for labs on Tuesday is still needed since he will not be taking the venetoclax. Pt wants to discuss this at this apt on Monday. No need to call him back at this time. Pt will keep his apt as planned at 815 am on Monday. He thanked me for speaking to him and getting the msg to the team.

## 2021-02-09 ENCOUNTER — Other Ambulatory Visit (HOSPITAL_COMMUNITY): Payer: Self-pay

## 2021-02-09 ENCOUNTER — Encounter: Payer: Self-pay | Admitting: Oncology

## 2021-02-09 ENCOUNTER — Other Ambulatory Visit: Payer: Self-pay

## 2021-02-09 ENCOUNTER — Inpatient Hospital Stay: Payer: Medicare HMO

## 2021-02-09 ENCOUNTER — Other Ambulatory Visit: Payer: Medicare HMO

## 2021-02-09 ENCOUNTER — Inpatient Hospital Stay: Payer: Medicare HMO | Attending: Oncology

## 2021-02-09 ENCOUNTER — Ambulatory Visit: Payer: Medicare HMO

## 2021-02-09 ENCOUNTER — Inpatient Hospital Stay: Payer: Medicare HMO | Admitting: Oncology

## 2021-02-09 VITALS — BP 142/67 | HR 99 | Temp 98.7°F | Resp 20 | Wt 202.7 lb

## 2021-02-09 DIAGNOSIS — D709 Neutropenia, unspecified: Secondary | ICD-10-CM | POA: Diagnosis not present

## 2021-02-09 DIAGNOSIS — Z923 Personal history of irradiation: Secondary | ICD-10-CM | POA: Diagnosis not present

## 2021-02-09 DIAGNOSIS — Z8505 Personal history of malignant neoplasm of liver: Secondary | ICD-10-CM | POA: Diagnosis not present

## 2021-02-09 DIAGNOSIS — D46C Myelodysplastic syndrome with isolated del(5q) chromosomal abnormality: Secondary | ICD-10-CM | POA: Diagnosis present

## 2021-02-09 DIAGNOSIS — D469 Myelodysplastic syndrome, unspecified: Secondary | ICD-10-CM | POA: Diagnosis not present

## 2021-02-09 DIAGNOSIS — Z5111 Encounter for antineoplastic chemotherapy: Secondary | ICD-10-CM | POA: Diagnosis not present

## 2021-02-09 DIAGNOSIS — D6959 Other secondary thrombocytopenia: Secondary | ICD-10-CM | POA: Insufficient documentation

## 2021-02-09 DIAGNOSIS — Z87891 Personal history of nicotine dependence: Secondary | ICD-10-CM | POA: Insufficient documentation

## 2021-02-09 DIAGNOSIS — K7460 Unspecified cirrhosis of liver: Secondary | ICD-10-CM | POA: Insufficient documentation

## 2021-02-09 DIAGNOSIS — Z79899 Other long term (current) drug therapy: Secondary | ICD-10-CM | POA: Insufficient documentation

## 2021-02-09 LAB — CBC WITH DIFFERENTIAL/PLATELET
Abs Immature Granulocytes: 0 10*3/uL (ref 0.00–0.07)
Band Neutrophils: 0 %
Basophils Absolute: 0 10*3/uL (ref 0.0–0.1)
Basophils Relative: 0 %
Blasts: 0 %
Eosinophils Absolute: 0.1 10*3/uL (ref 0.0–0.5)
Eosinophils Relative: 4 %
HCT: 38.1 % — ABNORMAL LOW (ref 39.0–52.0)
Hemoglobin: 13.7 g/dL (ref 13.0–17.0)
Lymphocytes Relative: 39 %
Lymphs Abs: 0.9 10*3/uL (ref 0.7–4.0)
MCH: 36.1 pg — ABNORMAL HIGH (ref 26.0–34.0)
MCHC: 36 g/dL (ref 30.0–36.0)
MCV: 100.3 fL — ABNORMAL HIGH (ref 80.0–100.0)
Metamyelocytes Relative: 0 %
Monocytes Absolute: 0.2 10*3/uL (ref 0.1–1.0)
Monocytes Relative: 8 %
Myelocytes: 0 %
Neutro Abs: 1.1 10*3/uL — ABNORMAL LOW (ref 1.7–7.7)
Neutrophils Relative %: 49 %
Other: 0 %
Platelets: 20 10*3/uL — CL (ref 150–400)
Promyelocytes Relative: 0 %
RBC: 3.8 MIL/uL — ABNORMAL LOW (ref 4.22–5.81)
RDW: 14.4 % (ref 11.5–15.5)
Smear Review: DECREASED
WBC: 2.3 10*3/uL — ABNORMAL LOW (ref 4.0–10.5)
nRBC: 0 % (ref 0.0–0.2)
nRBC: 0 /100 WBC

## 2021-02-09 LAB — COMPREHENSIVE METABOLIC PANEL
ALT: 30 U/L (ref 0–44)
AST: 28 U/L (ref 15–41)
Albumin: 4.4 g/dL (ref 3.5–5.0)
Alkaline Phosphatase: 57 U/L (ref 38–126)
Anion gap: 6 (ref 5–15)
BUN: 16 mg/dL (ref 8–23)
CO2: 29 mmol/L (ref 22–32)
Calcium: 8.6 mg/dL — ABNORMAL LOW (ref 8.9–10.3)
Chloride: 100 mmol/L (ref 98–111)
Creatinine, Ser: 0.99 mg/dL (ref 0.61–1.24)
GFR, Estimated: >60 mL/min (ref >60)
Glucose, Bld: 308 mg/dL — ABNORMAL HIGH (ref 70–99)
Potassium: 4.1 mmol/L (ref 3.5–5.1)
Sodium: 135 mmol/L (ref 135–145)
Total Bilirubin: 1.7 mg/dL — ABNORMAL HIGH (ref 0.3–1.2)
Total Protein: 6.8 g/dL (ref 6.5–8.1)

## 2021-02-09 LAB — URIC ACID: Uric Acid, Serum: 7.4 mg/dL (ref 3.7–8.6)

## 2021-02-09 LAB — PHOSPHORUS: Phosphorus: 3.4 mg/dL (ref 2.5–4.6)

## 2021-02-09 MED ORDER — ONDANSETRON HCL 4 MG PO TABS
8.0000 mg | ORAL_TABLET | Freq: Once | ORAL | Status: AC
  Administered 2021-02-09: 8 mg via ORAL
  Filled 2021-02-09: qty 2

## 2021-02-09 MED ORDER — AZACITIDINE CHEMO SQ INJECTION
75.0000 mg/m2 | Freq: Once | INTRAMUSCULAR | Status: AC
  Administered 2021-02-09: 162.5 mg via SUBCUTANEOUS
  Filled 2021-02-09: qty 6.5

## 2021-02-09 NOTE — Progress Notes (Signed)
Hematology/Oncology Consult note [facility]  [phone] [phone]  Patient Care Team: Perrin Maltese, MD as PCP - General (Internal Medicine) Dionisio David, MD (Cardiology) Sindy Guadeloupe, MD as Consulting Physician (Hematology and Oncology)   Name of the patient: Timothy Murray  [id number]  05-19-1945   Date of visit: 02/09/21  Diagnosis- MDS with 5q del but high risk given TP53 mutation 2. [place] s/p thermal ablation  Chief complaint/ Reason for visit-on treatment assessment prior to cycle 9 of Vidaza  Heme/Onc history: Patient is a 76 year old male with a history of cirrhosis and hepatocellular carcinoma for which she had proton beam radiation In June 2021.  He has baseline thrombocytopenia which was initially attributed to his cirrhosis.  He was found to have progressive pancytopenia with a steady decline in his white cell count since March 2022 when it was down to 3.4 with a platelet count of 53.  He was hospitalized sometime in April 2022 with acute biliary gallstone pancreatitis .  He underwent ERCP inpatient with platelet transfusion which showed sludge and biliary sphincterotomy was performed.  His bilirubin which was elevated up to 7.2 has come down to 2.4 presently.   However his pancytopenia has continued to worsen and presently white count is 1.1 with an H&H of 6.2/17.6 with an MCV of 102 and a platelet count of 33.Bone marrow biopsy showed evidence of dyspoietic changes in the erythroid and megakaryocyte excel lines associated with ring sideroblasts.  Definite increase in blastic cells not identified.  Overall features favor myelodysplastic syndrome.  Karyotype was normal but only for metaphase cells were analyzed which were not sufficient.  [employer] FISH MDS panel showed deletion of 5 q. along with monosomy 7 and deletion 7 q. without excess blasts.  SF3B1 mutation negative.   Peripheral blood intellegin myeloid panel showed  :   Detected     Change       Frequency (%) Impact  TP53   c.716A>C     p.Asn239Thr  19            Tier I  U2AF1  c.470A>C     p.Gln157Pro  17            Tier I  IDH1   c.315C>T     p.Gly105Gly  49            Tier II      With regards to his hepatocellular carcinoma patient had an MRI abdomen on 04/24/2020 which showed that his primary liver mass which was previously 4.3 x 3.6 cmWas similar in size at 4.5 x 3.5 cm.    Patient was briefly on Revlimid for 5 q. deletion but after T p53 came back he was switched to [place] for 5 days every 28 days starting 05/27/2020    Patient was seen by Dr. Marvel Plan at [facility] for second opinion and he also had a repeat bone marrow biopsyDone on 08/14/2020.  Biopsy showed normocellular bone marrow with erythroid predominant trilineage hematopoiesis.  Megakaryocytes were typical in number with few hypolobated forms.  Full sequence of orderly maturation noted in both granulopoiesis and erythropoiesis.  3% blasts were seen by manual differential.   Plan is to continue Vidaza until progression or toxicity which was started in May 2022  Interval history-patient is doing well presently and denies any significant complaints other than mild chronic fatigue.  Denies any bleeding issues.  ECOG PS- 1 Pain scale- 0   Review of  systems- Review of Systems  Constitutional:  Positive for malaise/fatigue. Negative for chills, fever and weight loss.  HENT:  Negative for congestion, ear discharge and nosebleeds.   Eyes:  Negative for blurred vision.  Respiratory:  Negative for cough, hemoptysis, sputum production, shortness of breath and wheezing.   Cardiovascular:  Negative for chest pain, palpitations, orthopnea and claudication.  Gastrointestinal:  Negative for abdominal pain, blood in stool, constipation, diarrhea, heartburn, melena, nausea and vomiting.  Genitourinary:  Negative for dysuria, flank pain, frequency, hematuria and urgency.  Musculoskeletal:  Negative for back  pain, joint pain and myalgias.  Skin:  Negative for rash.  Neurological:  Negative for dizziness, tingling, focal weakness, seizures, weakness and headaches.  Endo/Heme/Allergies:  Does not bruise/bleed easily.  Psychiatric/Behavioral:  Negative for depression and suicidal ideas. The patient does not have insomnia.      Allergies  Allergen Reactions   Dome-Paste Bandage [Wound Dressings]     All bandaids cause a rash   Soap & Cleansers     "liquid soap" antibacterial -rash   Latex Rash and Itching     Past Medical History:  Diagnosis Date   Arthritis    "lower back" (10/24/2012)   Coronary artery disease    GERD (gastroesophageal reflux disease)    High cholesterol    Hypertension    Liver cancer ([place])    MDS (myelodysplastic syndrome) ([place])    Type II diabetes mellitus ([place])      Past Surgical History:  Procedure Laterality Date   APPENDECTOMY  2002   CARDIAC CATHETERIZATION  10/24/2012   CORONARY ARTERY BYPASS GRAFT N/A 10/26/2012   Procedure: CORONARY ARTERY BYPASS GRAFTING (CABG);  Surgeon: Melrose Nakayama, MD;  Location: [facility];  Service: Open Heart Surgery;  Laterality: N/A;  Times 3 using left internal mammary artery and endoscopically harvested right saphenous vein   ERCP N/A 04/25/2020   Procedure: ENDOSCOPIC RETROGRADE CHOLANGIOPANCREATOGRAPHY (ERCP);  Surgeon: Lucilla Lame, MD;  Location: [facility] ENDOSCOPY;  Service: Endoscopy;  Laterality: N/A;   IR BONE MARROW BIOPSY & ASPIRATION  01/26/2021    Social History   Socioeconomic History   Marital status: Married    Spouse name: Not on file   Number of children: Not on file   Years of education: Not on file   Highest education level: Not on file  Occupational History   Not on file  Tobacco Use   Smoking status: Former    Packs/day: 0.50    Years: 30.00    Pack years: 15.00    Types: Cigarettes    Quit date: 08/28/2001    Years since quitting: 19.4   Smokeless tobacco: Never  Vaping Use   Vaping Use:  Never used  Substance and Sexual Activity   Alcohol use: No    Alcohol/week: 0.0 standard drinks    Comment: 10/24/2012 "stopped drinking alcohol in 2003; used to drink ~ 9 bottles beer/wk"   Drug use: No   Sexual activity: Yes  Other Topics Concern   Not on file  Social History Narrative   Not on file   Social Determinants of Health   Financial Resource Strain: Not on file  Food Insecurity: Not on file  Transportation Needs: Not on file  Physical Activity: Not on file  Stress: Not on file  Social Connections: Not on file  Intimate Partner Violence: Not on file    Family History  Problem Relation Age of Onset   Cancer Sister      Current Outpatient  Medications:    acyclovir (ZOVIRAX) 400 MG tablet, TAKE 1 TABLET BY MOUTH TWICE A DAY, Disp: 60 tablet, Rfl: 1   allopurinol (ZYLOPRIM) 300 MG tablet, Take 1 tablet (300 mg total) by mouth daily., Disp: 30 tablet, Rfl: 3   atorvastatin (LIPITOR) 40 MG tablet, Take by mouth., Disp: , Rfl:    glimepiride (AMARYL) 1 MG tablet, Take 1 mg by mouth daily., Disp: , Rfl:    levofloxacin (LEVAQUIN) 500 MG tablet, TAKE 1 TABLET (500 MG TOTAL) BY MOUTH DAILY., Disp: 30 tablet, Rfl: 0   loratadine (CLARITIN) 10 MG tablet, Take 5 mg by mouth daily as needed. Take half a tablet (80m) daily, Disp: , Rfl:    metoprolol tartrate (LOPRESSOR) 25 MG tablet, Take 25 mg by mouth daily., Disp: , Rfl:    omeprazole (PRILOSEC) 40 MG capsule, TAKE 1 CAPSULE BY MOUTH TWICE A DAY, Disp: 180 capsule, Rfl: 1   oxyCODONE (OXY IR/ROXICODONE) 5 MG immediate release tablet, Take 1 tablet (5 mg total) by mouth every 8 (eight) hours as needed for severe pain., Disp: 90 tablet, Rfl: 0   senna-docusate (SENOKOT-S) 8.6-50 MG tablet, Take 1 tablet by mouth every morning., Disp: , Rfl:    traZODone (DESYREL) 50 MG tablet, TAKE 1 TO 2 TABLETS BY MOUTH AT BEDTIME, Disp: 180 tablet, Rfl: 1   venetoclax (VENCLEXTA) 100 MG tablet, Take 1 tablet (100 mg total) by mouth daily  for 1 day, THEN 2 tablets (200 mg total) daily for 1 day, THEN 4 tablets (400 mg total) daily for 12 days. Hold for 14 days. Take with food.., Disp: 51 tablet, Rfl: 0   gabapentin (NEURONTIN) 300 MG capsule, Take 1 capsule (300 mg total) by mouth at bedtime. (Patient not taking: Reported on 09/15/2020), Disp: 30 capsule, Rfl: 0   LORazepam (ATIVAN) 0.5 MG tablet, Take 1 tablet (0.5 mg total) by mouth every 6 (six) hours as needed (Nausea or vomiting). (Patient not taking: Reported on 06/05/2020), Disp: 30 tablet, Rfl: 0   losartan (COZAAR) 25 MG tablet, Take 25 mg by mouth daily. (Patient not taking: Reported on 09/15/2020), Disp: , Rfl:    nitroGLYCERIN (NITROSTAT) 0.3 MG SL tablet, nitroglycerin 0.3 mg sublingual tablet  PLACE ONE TABLET (0.3 MG DOSE) UNDER THE TONGUE EVERY 5 (FIVE) MINUTES AS NEEDED FOR CHEST PAIN. (Patient not taking: Reported on 06/23/2020), Disp: , Rfl:    ondansetron (ZOFRAN) 8 MG tablet, Take 1 tablet (8 mg total) by mouth 2 (two) times daily as needed (Nausea or vomiting). (Patient not taking: Reported on 06/05/2020), Disp: 30 tablet, Rfl: 1   predniSONE (DELTASONE) 20 MG tablet, TAKE 1 TABLET BY MOUTH DAILY X 5 DAYS IN AM WITH FOOD (Patient not taking: Reported on 09/15/2020), Disp: , Rfl:    prochlorperazine (COMPAZINE) 10 MG tablet, Take 1 tablet (10 mg total) by mouth every 6 (six) hours as needed (Nausea or vomiting). (Patient not taking: Reported on 09/15/2020), Disp: 30 tablet, Rfl: 1  Physical exam:  Vitals:   02/09/21 0858  BP: (!) 142/67  Pulse: 99  Resp: 20  Temp: 98.7 F (37.1 C)  SpO2: 100%  Weight: 202 lb 11.2 oz (91.9 kg)   Physical Exam Constitutional:      General: He is not in acute distress. Cardiovascular:     Rate and Rhythm: Normal rate and regular rhythm.     Heart sounds: Normal heart sounds.  Pulmonary:     Effort: Pulmonary effort is normal.     Breath  sounds: Normal breath sounds.  Skin:    General: Skin is warm and dry.  Neurological:      Mental Status: He is alert and oriented to person, place, and time.     CMP Latest Ref Rng & Units 02/09/2021  Glucose 70 - 99 mg/dL 308(H)  BUN 8 - 23 mg/dL 16  Creatinine 0.61 - 1.24 mg/dL 0.99  Sodium 135 - 145 mmol/L 135  Potassium 3.5 - 5.1 mmol/L 4.1  Chloride 98 - 111 mmol/L 100  CO2 22 - 32 mmol/L 29  Calcium 8.9 - 10.3 mg/dL 8.6(L)  Total Protein 6.5 - 8.1 g/dL 6.8  Total Bilirubin 0.3 - 1.2 mg/dL 1.7(H)  Alkaline Phos 38 - 126 U/L 57  AST 15 - 41 U/L 28  ALT 0 - 44 U/L 30   CBC Latest Ref Rng & Units 02/09/2021  WBC 4.0 - 10.5 K/uL 2.3(L)  Hemoglobin 13.0 - 17.0 g/dL 13.7  Hematocrit 39.0 - 52.0 % 38.1(L)  Platelets 150 - 400 K/uL 20(LL)    No images are attached to the encounter.  IR BONE MARROW BIOPSY & ASPIRATION  Result Date: 01/26/2021 CLINICAL DATA:  Cirrhosis, splenomegaly, history of myelodysplastic syndrome EXAM: FLUORO GUIDED DEEP ILIAC BONE ASPIRATION AND CORE BIOPSY TECHNIQUE: Patient was placed prone on the fluoroscopy table in IR and an appropriate approach was selected parallel to the SI joint on the left. Appropriate skin entry site was identified. Skin site was marked, prepped with chlorhexidine, draped in usual sterile fashion, and infiltrated locally with 1% lidocaine. Intravenous Fentanyl 16mg and Versed 141mwere administered as conscious sedation during continuous monitoring of the patient's level of consciousness and physiological / cardiorespiratory status by the radiology RN, with a total moderate sedation time of 10 minutes. Under fluoroscopic guidance an 11-gauge Cook trocar bone needle was advanced into the left iliac bone just lateral to the sacroiliac joint. Once needle tip position was confirmed, core and aspiration samples were obtained, submitted to pathology for approval. Patient tolerated procedure well. COMPLICATIONS: COMPLICATIONS none IMPRESSION: 1. Technically successful fluoroscopy guided left iliac bone core and aspiration biopsy.  Electronically Signed   By: D Lucrezia Europe.D.   On: 01/26/2021 13:26     Assessment and plan- Patient is a [age].o. male with high risk MDS here for on treatment assessment prior to cycle 9-day 1 of Vidaza  I discussed patient's case [facility] Patient has been on Vi[place]or about 9 months now.  Most recently over the last 4 to 6 weeks his platelets have been trending down.  Platelets down to 20 today.  Hemoglobin is still stable around 13.  Neutropenia is mild and stable.  Repeat bone marrow biopsy shows persistent MDS but no increase in blasts.  My initial plan was to add venetoclax to his Vidaza regimen given that his platelet counts were trending down.  However given that there has been no significant increase in blast count, I will hold off on adding venetoclax which could further exacerbate his cytopenias.  Patient does have underlying cirrhosis and therefore I will try Promacta instead to see if that would help with hisPlatelet counts.  Discussed risks and benefits of Promacta including all but not limited to abnormal LFTs, thromboembolic disease, skin rash nausea vomiting and diarrhea.  We will start him on 50 mg daily dose and then adjust his dosage based on platelet counts which will be checked weekly.  Eltrombopag has been studied in patients who are thrombocytopenic  with AML or MDS based on ASPIRE study which was a double-blind phase for 8 weeks phase 1 and phase 2 comprising of 17 patients in phase 1 and 145 patients in phase 2 patients were noted to have reduced platelet requirements in the study.  Eltrombopag has been approved by his insurance and he should be able to start the drug in 1 to 2 days time  CBC with differential in 1 week in 2 weeks and I will see him in 2 weeks for possible platelet transfusion and see how he is responding to Promacta.  Patient did have an adequate megakaryocyte reserve on his recent bone marrow biopsy.  Patient will be receiving full dose  Vidaza at 75 mg per metered square 5 days this week and 2 days next week.   Visit Diagnosis 1. MDS (myelodysplastic syndrome) ([place])   2. Encounter for antineoplastic chemotherapy      Dr. Randa Evens, MD, MPH [facility] at [facility] [phone] 02/09/2021 12:01 PM

## 2021-02-09 NOTE — Patient Instructions (Signed)
[facility] CANCER CTR AT [place]  Discharge Instructions: Thank you for choosing [place] to provide your oncology and hematology care.  If you have a lab appointment with the [place], please go directly to the [place] and check in at the registration area.  Wear comfortable clothing and clothing appropriate for easy access to any Portacath or PICC line.   We strive to give you quality time with your provider. You may need to reschedule your appointment if you arrive late (15 or more minutes).  Arriving late affects you and other patients whose appointments are after yours.  Also, if you miss three or more appointments without notifying the office, you may be dismissed from the clinic at the providers discretion.      For prescription refill requests, have your pharmacy contact our office and allow 72 hours for refills to be completed.    Today you received the following chemotherapy and/or immunotherapy agents vidaza      To help prevent nausea and vomiting after your treatment, we encourage you to take your nausea medication as directed.  BELOW ARE SYMPTOMS THAT SHOULD BE REPORTED IMMEDIATELY: *FEVER GREATER THAN 100.4 F (38 C) OR HIGHER *CHILLS OR SWEATING *NAUSEA AND VOMITING THAT IS NOT CONTROLLED WITH YOUR NAUSEA MEDICATION *UNUSUAL SHORTNESS OF BREATH *UNUSUAL BRUISING OR BLEEDING *URINARY PROBLEMS (pain or burning when urinating, or frequent urination) *BOWEL PROBLEMS (unusual diarrhea, constipation, pain near the anus) TENDERNESS IN MOUTH AND THROAT WITH OR WITHOUT PRESENCE OF ULCERS (sore throat, sores in mouth, or a toothache) UNUSUAL RASH, SWELLING OR PAIN  UNUSUAL VAGINAL DISCHARGE OR ITCHING   Items with * indicate a potential emergency and should be followed up as soon as possible or go to the Emergency Department if any problems should occur.  Please show the CHEMOTHERAPY ALERT CARD or IMMUNOTHERAPY ALERT CARD at check-in to the  Emergency Department and triage nurse.  Should you have questions after your visit or need to cancel or reschedule your appointment, please contact [facility] CANCER [place] AT [place]  [phone] and follow the prompts.  Office hours are 8:00 a.m. to 4:30 p.m. Monday - Friday. Please note that voicemails left after 4:00 p.m. may not be returned until the following business day.  We are closed weekends and major holidays. You have access to a nurse at all times for urgent questions. Please call the main number to the clinic [phone] and follow the prompts.  For any non-urgent questions, you may also contact your provider using MyChart. We now offer e-Visits for anyone 5 and older to request care online for non-urgent symptoms. For details visit [email or web address].   Also download the MyChart app! Go to the app store, search "MyChart", open the app, select [place], and log in with your MyChart username and password.  Due to Covid, a mask is required upon entering the hospital/clinic. If you do not have a mask, one will be given to you upon arrival. For doctor visits, patients may have 1 support person aged 22 or older with them. For treatment visits, patients cannot have anyone with them due to current Covid guidelines and our immunocompromised population.   Azacitidine suspension for injection (subcutaneous use) What is this medication? AZACITIDINE (ay [place]) is a chemotherapy drug. This medicine reduces the growth of cancer cells and can suppress the immune system. It is used for treating myelodysplastic syndrome or some types of leukemia. This medicine may be used for other purposes; ask  your health care provider or pharmacist if you have questions. COMMON BRAND NAME(S): Vidaza What should I tell my care team before I take this medication? They need to know if you have any of these conditions: kidney disease liver disease liver tumors an unusual or allergic  reaction to azacitidine, mannitol, other medicines, foods, dyes, or preservatives pregnant or trying to get pregnant breast-feeding How should I use this medication? This medicine is for injection under the skin. It is administered in a hospital or clinic by a specially trained health care professional. Talk to your pediatrician regarding the use of this medicine in children. While this drug may be prescribed for selected conditions, precautions do apply. Overdosage: If you think you have taken too much of this medicine contact a poison control center or emergency room at once. NOTE: This medicine is only for you. Do not share this medicine with others. What if I miss a dose? It is important not to miss your dose. Call your doctor or health care professional if you are unable to keep an appointment. What may interact with this medication? Interactions have not been studied. Give your health care provider a list of all the medicines, herbs, non-prescription drugs, or dietary supplements you use. Also tell them if you smoke, drink alcohol, or use illegal drugs. Some items may interact with your medicine. This list may not describe all possible interactions. Give your health care provider a list of all the medicines, herbs, non-prescription drugs, or dietary supplements you use. Also tell them if you smoke, drink alcohol, or use illegal drugs. Some items may interact with your medicine. What should I watch for while using this medication? Visit your doctor for checks on your progress. This drug may make you feel generally unwell. This is not uncommon, as chemotherapy can affect healthy cells as well as cancer cells. Report any side effects. Continue your course of treatment even though you feel ill unless your doctor tells you to stop. In some cases, you may be given additional medicines to help with side effects. Follow all directions for their use. Call your doctor or health care professional for  advice if you get a fever, chills or sore throat, or other symptoms of a cold or flu. Do not treat yourself. This drug decreases your body's ability to fight infections. Try to avoid being around people who are sick. This medicine may increase your risk to bruise or bleed. Call your doctor or health care professional if you notice any unusual bleeding. You may need blood work done while you are taking this medicine. Do not become pregnant while taking this medicine and for 6 months after the last dose. Women should inform their doctor if they wish to become pregnant or think they might be pregnant. Men should not father a child while taking this medicine and for 3 months after the last dose. There is a potential for serious side effects to an unborn child. Talk to your health care professional or pharmacist for more information. Do not breast-feed an infant while taking this medicine and for 1 week after the last dose. This medicine may interfere with the ability to have a child. Talk with your doctor or health care professional if you are concerned about your fertility. What side effects may I notice from receiving this medication? Side effects that you should report to your doctor or health care professional as soon as possible: allergic reactions like skin rash, itching or hives, swelling of the face, lips,  or tongue low blood counts - this medicine may decrease the number of white blood cells, red blood cells and platelets. You may be at increased risk for infections and bleeding. signs of infection - fever or chills, cough, sore throat, pain passing urine signs of decreased platelets or bleeding - bruising, pinpoint red spots on the skin, black, tarry stools, blood in the urine signs of decreased red blood cells - unusually weak or tired, fainting spells, lightheadedness signs and symptoms of kidney injury like trouble passing urine or change in the amount of urine signs and symptoms of liver  injury like dark yellow or brown urine; general ill feeling or flu-like symptoms; light-colored stools; loss of appetite; nausea; right upper belly pain; unusually weak or tired; yellowing of the eyes or skin Side effects that usually do not require medical attention (report to your doctor or health care professional if they continue or are bothersome): constipation diarrhea nausea, vomiting pain or redness at the injection site unusually weak or tired This list may not describe all possible side effects. Call your doctor for medical advice about side effects. You may report side effects to FDA at 1-800-FDA-1088. Where should I keep my medication? This drug is given in a hospital or clinic and will not be stored at home. NOTE: This sheet is a summary. It may not cover all possible information. If you have questions about this medicine, talk to your doctor, pharmacist, or health care provider.  2022 Elsevier/Gold Standard (2016-01-21 00:00:00)

## 2021-02-09 NOTE — Progress Notes (Signed)
Okay to proceed with treatment today per Dr. Janese Banks regardless of lab results.  Vidaza injections administered to right lower abdominal quadrant, sites benign.  Pt tolerated treatment well and left the chemo suite stable and ambulatory.

## 2021-02-10 ENCOUNTER — Other Ambulatory Visit (HOSPITAL_COMMUNITY): Payer: Self-pay

## 2021-02-10 ENCOUNTER — Inpatient Hospital Stay: Payer: Medicare HMO

## 2021-02-10 ENCOUNTER — Inpatient Hospital Stay: Payer: Medicare HMO | Admitting: Pharmacist

## 2021-02-10 ENCOUNTER — Telehealth: Payer: Self-pay | Admitting: Pharmacist

## 2021-02-10 VITALS — BP 130/58 | HR 77 | Temp 96.8°F | Resp 18 | Ht 72.0 in | Wt 202.6 lb

## 2021-02-10 DIAGNOSIS — Z87891 Personal history of nicotine dependence: Secondary | ICD-10-CM | POA: Diagnosis not present

## 2021-02-10 DIAGNOSIS — Z79899 Other long term (current) drug therapy: Secondary | ICD-10-CM | POA: Diagnosis not present

## 2021-02-10 DIAGNOSIS — D6959 Other secondary thrombocytopenia: Secondary | ICD-10-CM | POA: Diagnosis not present

## 2021-02-10 DIAGNOSIS — Z5111 Encounter for antineoplastic chemotherapy: Secondary | ICD-10-CM | POA: Diagnosis not present

## 2021-02-10 DIAGNOSIS — Z923 Personal history of irradiation: Secondary | ICD-10-CM | POA: Diagnosis not present

## 2021-02-10 DIAGNOSIS — D46C Myelodysplastic syndrome with isolated del(5q) chromosomal abnormality: Secondary | ICD-10-CM | POA: Diagnosis not present

## 2021-02-10 DIAGNOSIS — D469 Myelodysplastic syndrome, unspecified: Secondary | ICD-10-CM

## 2021-02-10 DIAGNOSIS — K7460 Unspecified cirrhosis of liver: Secondary | ICD-10-CM | POA: Diagnosis not present

## 2021-02-10 DIAGNOSIS — D709 Neutropenia, unspecified: Secondary | ICD-10-CM | POA: Diagnosis not present

## 2021-02-10 DIAGNOSIS — Z8505 Personal history of malignant neoplasm of liver: Secondary | ICD-10-CM | POA: Diagnosis not present

## 2021-02-10 MED ORDER — ELTROMBOPAG OLAMINE 50 MG PO TABS
50.0000 mg | ORAL_TABLET | Freq: Every day | ORAL | 0 refills | Status: DC
  Filled 2021-02-10 (×2): qty 30, 30d supply, fill #0

## 2021-02-10 MED ORDER — ONDANSETRON HCL 4 MG PO TABS
8.0000 mg | ORAL_TABLET | Freq: Once | ORAL | Status: AC
  Administered 2021-02-10: 8 mg via ORAL
  Filled 2021-02-10: qty 2

## 2021-02-10 MED ORDER — AZACITIDINE CHEMO SQ INJECTION
75.0000 mg/m2 | Freq: Once | INTRAMUSCULAR | Status: AC
  Administered 2021-02-10: 162.5 mg via SUBCUTANEOUS
  Filled 2021-02-10: qty 6.5

## 2021-02-10 NOTE — Telephone Encounter (Signed)
Oral Oncology Pharmacist Encounter  Received new prescription for Promacta (eltrombopag) for the treatment of MDS with thrombocytopenia, planned duration until no longer working or unacceptable drug toxicity.  CMP/CBC from 02/09/21 assessed, no relevant lab abnormalities. Prescription dose and frequency assessed.   Current medication list in Epic reviewed, a few DDIs with eltrombopag identified: Lorazepam: eltrombopag may increase the serum concentration of lorazepam. Monitor for increased lorazepam adverse effects. Lorazepam prescribed at a low dose and currently listed as "not taking" on med list. Atorvastatin: eltrombopag may increase the serum concentration atorvastatin.  Monitor for adverse events related to atorvastatin (muscle pain, changes in urine color). No baseline dose adjustment needed.   Evaluated chart and no patient barriers to medication adherence identified.   Prescription has been e-scribed to the [facility] for benefits analysis and approval.  Oral Oncology Clinic will continue to follow for insurance authorization, copayment issues, initial counseling and start date.  Patient agreed to treatment on 02/09/21 per MD documentation.  Darl Pikes, PharmD, BCPS, BCOP, CPP Hematology/Oncology Clinical Pharmacist Practitioner [place]/DB/AP [facility] [phone]  02/10/2021 11:51 AM

## 2021-02-10 NOTE — Telephone Encounter (Signed)
Oral Oncology Patient Advocate Encounter   Was successful in securing patient a $10,500 grant from [employer] [place] (PAF) to provide copayment coverage for Promacta. This will keep the out of pocket expense at $0.     I have spoken with the patient.    The billing information is as follows and has been shared with [place].   RxBin: [id number] PCN:  [id number] Member ID: [id number] Group ID: [id number] Dates of Eligibility: 08/07/20 through 02/04/22  Fund: [place] [facility] [place] Phone [phone] Fax [phone] 02/10/2021 1:52 PM

## 2021-02-10 NOTE — Patient Instructions (Signed)
[facility] CANCER CTR AT [place]  Discharge Instructions: Thank you for choosing [place] to provide your oncology and hematology care.  If you have a lab appointment with the [facility], please go directly to the [facility] and check in at the registration area.  Wear comfortable clothing and clothing appropriate for easy access to any Portacath or PICC line.   We strive to give you quality time with your provider. You may need to reschedule your appointment if you arrive late (15 or more minutes).  Arriving late affects you and other patients whose appointments are after yours.  Also, if you miss three or more appointments without notifying the office, you may be dismissed from the clinic at the providers discretion.      For prescription refill requests, have your pharmacy contact our office and allow 72 hours for refills to be completed.    Today you received the following chemotherapy and/or immunotherapy agents VIDAZA      To help prevent nausea and vomiting after your treatment, we encourage you to take your nausea medication as directed.  BELOW ARE SYMPTOMS THAT SHOULD BE REPORTED IMMEDIATELY: *FEVER GREATER THAN 100.4 F (38 C) OR HIGHER *CHILLS OR SWEATING *NAUSEA AND VOMITING THAT IS NOT CONTROLLED WITH YOUR NAUSEA MEDICATION *UNUSUAL SHORTNESS OF BREATH *UNUSUAL BRUISING OR BLEEDING *URINARY PROBLEMS (pain or burning when urinating, or frequent urination) *BOWEL PROBLEMS (unusual diarrhea, constipation, pain near the anus) TENDERNESS IN MOUTH AND THROAT WITH OR WITHOUT PRESENCE OF ULCERS (sore throat, sores in mouth, or a toothache) UNUSUAL RASH, SWELLING OR PAIN  UNUSUAL VAGINAL DISCHARGE OR ITCHING   Items with * indicate a potential emergency and should be followed up as soon as possible or go to the Emergency Department if any problems should occur.  Please show the CHEMOTHERAPY ALERT CARD or IMMUNOTHERAPY ALERT CARD at check-in to the  Emergency Department and triage nurse.  Should you have questions after your visit or need to cancel or reschedule your appointment, please contact [facility] CANCER [place] AT [place]  [phone] and follow the prompts.  Office hours are 8:00 a.m. to 4:30 p.m. Monday - Friday. Please note that voicemails left after 4:00 p.m. may not be returned until the following business day.  We are closed weekends and major holidays. You have access to a nurse at all times for urgent questions. Please call the main number to the clinic [phone] and follow the prompts.  For any non-urgent questions, you may also contact your provider using MyChart. We now offer e-Visits for anyone 106 and older to request care online for non-urgent symptoms. For details visit [email or web address].   Also download the MyChart app! Go to the app store, search "MyChart", open the app, select [place], and log in with your MyChart username and password.  Due to Covid, a mask is required upon entering the hospital/clinic. If you do not have a mask, one will be given to you upon arrival. For doctor visits, patients may have 1 support person aged 65 or older with them. For treatment visits, patients cannot have anyone with them due to current Covid guidelines and our immunocompromised population.   Azacitidine suspension for injection (subcutaneous use) What is this medication? AZACITIDINE (ay [place]) is a chemotherapy drug. This medicine reduces the growth of cancer cells and can suppress the immune system. It is used for treating myelodysplastic syndrome or some types of leukemia. This medicine may be used for other purposes; ask  your health care provider or pharmacist if you have questions. COMMON BRAND NAME(S): Vidaza What should I tell my care team before I take this medication? They need to know if you have any of these conditions: kidney disease liver disease liver tumors an unusual or allergic  reaction to azacitidine, mannitol, other medicines, foods, dyes, or preservatives pregnant or trying to get pregnant breast-feeding How should I use this medication? This medicine is for injection under the skin. It is administered in a hospital or clinic by a specially trained health care professional. Talk to your pediatrician regarding the use of this medicine in children. While this drug may be prescribed for selected conditions, precautions do apply. Overdosage: If you think you have taken too much of this medicine contact a poison control center or emergency room at once. NOTE: This medicine is only for you. Do not share this medicine with others. What if I miss a dose? It is important not to miss your dose. Call your doctor or health care professional if you are unable to keep an appointment. What may interact with this medication? Interactions have not been studied. Give your health care provider a list of all the medicines, herbs, non-prescription drugs, or dietary supplements you use. Also tell them if you smoke, drink alcohol, or use illegal drugs. Some items may interact with your medicine. This list may not describe all possible interactions. Give your health care provider a list of all the medicines, herbs, non-prescription drugs, or dietary supplements you use. Also tell them if you smoke, drink alcohol, or use illegal drugs. Some items may interact with your medicine. What should I watch for while using this medication? Visit your doctor for checks on your progress. This drug may make you feel generally unwell. This is not uncommon, as chemotherapy can affect healthy cells as well as cancer cells. Report any side effects. Continue your course of treatment even though you feel ill unless your doctor tells you to stop. In some cases, you may be given additional medicines to help with side effects. Follow all directions for their use. Call your doctor or health care professional for  advice if you get a fever, chills or sore throat, or other symptoms of a cold or flu. Do not treat yourself. This drug decreases your body's ability to fight infections. Try to avoid being around people who are sick. This medicine may increase your risk to bruise or bleed. Call your doctor or health care professional if you notice any unusual bleeding. You may need blood work done while you are taking this medicine. Do not become pregnant while taking this medicine and for 6 months after the last dose. Women should inform their doctor if they wish to become pregnant or think they might be pregnant. Men should not father a child while taking this medicine and for 3 months after the last dose. There is a potential for serious side effects to an unborn child. Talk to your health care professional or pharmacist for more information. Do not breast-feed an infant while taking this medicine and for 1 week after the last dose. This medicine may interfere with the ability to have a child. Talk with your doctor or health care professional if you are concerned about your fertility. What side effects may I notice from receiving this medication? Side effects that you should report to your doctor or health care professional as soon as possible: allergic reactions like skin rash, itching or hives, swelling of the face, lips,  or tongue low blood counts - this medicine may decrease the number of white blood cells, red blood cells and platelets. You may be at increased risk for infections and bleeding. signs of infection - fever or chills, cough, sore throat, pain passing urine signs of decreased platelets or bleeding - bruising, pinpoint red spots on the skin, black, tarry stools, blood in the urine signs of decreased red blood cells - unusually weak or tired, fainting spells, lightheadedness signs and symptoms of kidney injury like trouble passing urine or change in the amount of urine signs and symptoms of liver  injury like dark yellow or brown urine; general ill feeling or flu-like symptoms; light-colored stools; loss of appetite; nausea; right upper belly pain; unusually weak or tired; yellowing of the eyes or skin Side effects that usually do not require medical attention (report to your doctor or health care professional if they continue or are bothersome): constipation diarrhea nausea, vomiting pain or redness at the injection site unusually weak or tired This list may not describe all possible side effects. Call your doctor for medical advice about side effects. You may report side effects to FDA at 1-800-FDA-1088. Where should I keep my medication? This drug is given in a hospital or clinic and will not be stored at home. NOTE: This sheet is a summary. It may not cover all possible information. If you have questions about this medicine, talk to your doctor, pharmacist, or health care provider.  2022 Elsevier/Gold Standard (2016-01-21 00:00:00)

## 2021-02-11 ENCOUNTER — Ambulatory Visit: Payer: Medicare HMO | Admitting: Pharmacist

## 2021-02-11 ENCOUNTER — Inpatient Hospital Stay: Payer: Medicare HMO

## 2021-02-11 ENCOUNTER — Inpatient Hospital Stay: Payer: Medicare HMO | Admitting: Pharmacist

## 2021-02-11 ENCOUNTER — Other Ambulatory Visit: Payer: Self-pay

## 2021-02-11 VITALS — BP 129/63 | HR 75 | Temp 96.0°F | Resp 18

## 2021-02-11 DIAGNOSIS — Z79899 Other long term (current) drug therapy: Secondary | ICD-10-CM | POA: Diagnosis not present

## 2021-02-11 DIAGNOSIS — Z87891 Personal history of nicotine dependence: Secondary | ICD-10-CM | POA: Diagnosis not present

## 2021-02-11 DIAGNOSIS — D469 Myelodysplastic syndrome, unspecified: Secondary | ICD-10-CM

## 2021-02-11 DIAGNOSIS — Z5111 Encounter for antineoplastic chemotherapy: Secondary | ICD-10-CM | POA: Diagnosis not present

## 2021-02-11 DIAGNOSIS — D709 Neutropenia, unspecified: Secondary | ICD-10-CM | POA: Diagnosis not present

## 2021-02-11 DIAGNOSIS — D46C Myelodysplastic syndrome with isolated del(5q) chromosomal abnormality: Secondary | ICD-10-CM | POA: Diagnosis not present

## 2021-02-11 DIAGNOSIS — D6959 Other secondary thrombocytopenia: Secondary | ICD-10-CM | POA: Diagnosis not present

## 2021-02-11 DIAGNOSIS — Z923 Personal history of irradiation: Secondary | ICD-10-CM | POA: Diagnosis not present

## 2021-02-11 DIAGNOSIS — Z8505 Personal history of malignant neoplasm of liver: Secondary | ICD-10-CM | POA: Diagnosis not present

## 2021-02-11 DIAGNOSIS — K7460 Unspecified cirrhosis of liver: Secondary | ICD-10-CM | POA: Diagnosis not present

## 2021-02-11 MED ORDER — ONDANSETRON HCL 4 MG PO TABS
8.0000 mg | ORAL_TABLET | Freq: Once | ORAL | Status: AC
  Administered 2021-02-11: 8 mg via ORAL
  Filled 2021-02-11: qty 2

## 2021-02-11 MED ORDER — AZACITIDINE CHEMO SQ INJECTION
75.0000 mg/m2 | Freq: Once | INTRAMUSCULAR | Status: AC
  Administered 2021-02-11: 162.5 mg via SUBCUTANEOUS
  Filled 2021-02-11: qty 6.5

## 2021-02-11 NOTE — Patient Instructions (Signed)
[facility] CANCER CTR AT [place]  Discharge Instructions: Thank you for choosing [place] to provide your oncology and hematology care.  If you have a lab appointment with the [facility], please go directly to the [place] and check in at the registration area.  Wear comfortable clothing and clothing appropriate for easy access to any Portacath or PICC line.   We strive to give you quality time with your provider. You may need to reschedule your appointment if you arrive late (15 or more minutes).  Arriving late affects you and other patients whose appointments are after yours.  Also, if you miss three or more appointments without notifying the office, you may be dismissed from the clinic at the providers discretion.      For prescription refill requests, have your pharmacy contact our office and allow 72 hours for refills to be completed.    Today you received the following chemotherapy and/or immunotherapy agents : Vidaza    To help prevent nausea and vomiting after your treatment, we encourage you to take your nausea medication as directed.  BELOW ARE SYMPTOMS THAT SHOULD BE REPORTED IMMEDIATELY: *FEVER GREATER THAN 100.4 F (38 C) OR HIGHER *CHILLS OR SWEATING *NAUSEA AND VOMITING THAT IS NOT CONTROLLED WITH YOUR NAUSEA MEDICATION *UNUSUAL SHORTNESS OF BREATH *UNUSUAL BRUISING OR BLEEDING *URINARY PROBLEMS (pain or burning when urinating, or frequent urination) *BOWEL PROBLEMS (unusual diarrhea, constipation, pain near the anus) TENDERNESS IN MOUTH AND THROAT WITH OR WITHOUT PRESENCE OF ULCERS (sore throat, sores in mouth, or a toothache) UNUSUAL RASH, SWELLING OR PAIN  UNUSUAL VAGINAL DISCHARGE OR ITCHING   Items with * indicate a potential emergency and should be followed up as soon as possible or go to the Emergency Department if any problems should occur.  Please show the CHEMOTHERAPY ALERT CARD or IMMUNOTHERAPY ALERT CARD at check-in to the  Emergency Department and triage nurse.  Should you have questions after your visit or need to cancel or reschedule your appointment, please contact [facility] CANCER [place] AT [facility] [place]  [phone] and follow the prompts.  Office hours are 8:00 a.m. to 4:30 p.m. Monday - Friday. Please note that voicemails left after 4:00 p.m. may not be returned until the following business day.  We are closed weekends and major holidays. You have access to a nurse at all times for urgent questions. Please call the main number to the clinic [phone] and follow the prompts.  For any non-urgent questions, you may also contact your provider using MyChart. We now offer e-Visits for anyone 22 and older to request care online for non-urgent symptoms. For details visit [email or web address].   Also download the MyChart app! Go to the app store, search "MyChart", open the app, select , and log in with your MyChart username and password.  Due to Covid, a mask is required upon entering the hospital/clinic. If you do not have a mask, one will be given to you upon arrival. For doctor visits, patients may have 1 support person aged 61 or older with them. For treatment visits, patients cannot have anyone with them due to current Covid guidelines and our immunocompromised population.

## 2021-02-11 NOTE — Progress Notes (Signed)
[place]  Telephone:([phone] [phone]  Patient Care Team: Perrin Maltese, MD as PCP - General (Internal Medicine) Dionisio David, MD (Cardiology) Sindy Guadeloupe, MD as Consulting Physician (Hematology and Oncology)   Name of the patient: Timothy Murray  [id number]  04-Dec-1945   Date of visit: 02/11/21  HPI: Patient is a 76 y.o. male with MDS with thrombocytopenia. Planned treatment of thrombocytopenia with Promacta (eltrombopag).  Reason for Consult: Eltrombopag oral chemotherapy education.   PAST MEDICAL HISTORY: Past Medical History:  Diagnosis Date   Arthritis    "lower back" (10/24/2012)   Coronary artery disease    GERD (gastroesophageal reflux disease)    High cholesterol    Hypertension    Liver cancer ([place])    MDS (myelodysplastic syndrome) (HCC)    Type II diabetes mellitus ([place])     HEMATOLOGY/ONCOLOGY HISTORY:  Oncology History  MDS (myelodysplastic syndrome) ([place])  05/26/2020 Initial Diagnosis   MDS (myelodysplastic syndrome) ([place])   05/27/2020 -  Chemotherapy   Patient is on Treatment Plan : MYELODYSPLASIA  Azacitidine SQ D1-7 q28d       ALLERGIES:  is allergic to dome-paste bandage [wound dressings], soap & cleansers, and latex.  MEDICATIONS:  Current Outpatient Medications  Medication Sig Dispense Refill   acyclovir (ZOVIRAX) 400 MG tablet TAKE 1 TABLET BY MOUTH TWICE A DAY 60 tablet 1   allopurinol (ZYLOPRIM) 300 MG tablet Take 1 tablet (300 mg total) by mouth daily. 30 tablet 3   atorvastatin (LIPITOR) 40 MG tablet Take by mouth.     Blood Glucose Monitoring Suppl (ONETOUCH VERIO FLEX SYSTEM) w/Device KIT 1 each by Does not apply route in the morning and at bedtime.     eltrombopag (PROMACTA) 50 MG tablet Take 1 tablet (50 mg total) by mouth daily. Take on an empty stomach 1 hour before a meal or 2 hours after 30 tablet 0   FLUZONE HIGH-DOSE QUADRIVALENT 0.7 ML SUSY Inject 1 each into  the skin once.     gabapentin (NEURONTIN) 300 MG capsule Take 1 capsule (300 mg total) by mouth at bedtime. (Patient not taking: Reported on 09/15/2020) 30 capsule 0   glimepiride (AMARYL) 1 MG tablet Take 1 mg by mouth daily.     Lancets (ONETOUCH DELICA PLUS [id number]) MISC Apply 1 each topically 2 (two) times daily.     levofloxacin (LEVAQUIN) 500 MG tablet TAKE 1 TABLET (500 MG TOTAL) BY MOUTH DAILY. 30 tablet 0   loratadine (CLARITIN) 10 MG tablet Take 5 mg by mouth daily as needed. Take half a tablet (8m) daily     LORazepam (ATIVAN) 0.5 MG tablet Take 1 tablet (0.5 mg total) by mouth every 6 (six) hours as needed (Nausea or vomiting). (Patient not taking: Reported on 06/05/2020) 30 tablet 0   losartan (COZAAR) 25 MG tablet Take 25 mg by mouth daily. (Patient not taking: Reported on 09/15/2020)     metoprolol tartrate (LOPRESSOR) 25 MG tablet Take 25 mg by mouth daily.     nitroGLYCERIN (NITROSTAT) 0.3 MG SL tablet nitroglycerin 0.3 mg sublingual tablet  PLACE ONE TABLET (0.3 MG DOSE) UNDER THE TONGUE EVERY 5 (FIVE) MINUTES AS NEEDED FOR CHEST PAIN. (Patient not taking: Reported on 06/23/2020)     omeprazole (PRILOSEC) 40 MG capsule TAKE 1 CAPSULE BY MOUTH TWICE A DAY 180 capsule 1   ondansetron (ZOFRAN) 8 MG tablet Take 1 tablet (8 mg total) by mouth 2 (two) times daily as needed (  Nausea or vomiting). (Patient not taking: Reported on 06/05/2020) 30 tablet 1   ONETOUCH VERIO test strip 1 each by Other route 2 (two) times daily.     oxyCODONE (OXY IR/ROXICODONE) 5 MG immediate release tablet Take 1 tablet (5 mg total) by mouth every 8 (eight) hours as needed for severe pain. 90 tablet 0   predniSONE (DELTASONE) 20 MG tablet TAKE 1 TABLET BY MOUTH DAILY X 5 DAYS IN AM WITH FOOD (Patient not taking: Reported on 09/15/2020)     prochlorperazine (COMPAZINE) 10 MG tablet Take 1 tablet (10 mg total) by mouth every 6 (six) hours as needed (Nausea or vomiting). (Patient not taking: Reported on 09/15/2020) 30  tablet 1   senna-docusate (SENOKOT-S) 8.6-50 MG tablet Take 1 tablet by mouth every morning.     traZODone (DESYREL) 50 MG tablet TAKE 1 TO 2 TABLETS BY MOUTH AT BEDTIME 180 tablet 1   No current facility-administered medications for this visit.    VITAL SIGNS: There were no vitals taken for this visit. There were no vitals filed for this visit.  Estimated body mass index is 27.48 kg/m as calculated from the following:   Height as of 02/10/21: 6' (1.829 m).   Weight as of 02/10/21: 91.9 kg (202 lb 9.6 oz).  LABS: CBC:    Component Value Date/Time   WBC 2.3 (L) 02/09/2021 0850   HGB 13.7 02/09/2021 0850   HGB 15.6 10/24/2012 1224   HCT 38.1 (L) 02/09/2021 0850   HCT 43.8 10/24/2012 1224   PLT 20 (LL) 02/09/2021 0850   PLT 165 10/24/2012 1224   MCV 100.3 (H) 02/09/2021 0850   MCV 91 10/24/2012 1224   NEUTROABS 1.1 (L) 02/09/2021 0850   NEUTROABS 4.8 10/24/2012 1224   LYMPHSABS 0.9 02/09/2021 0850   LYMPHSABS 1.8 10/24/2012 1224   MONOABS 0.2 02/09/2021 0850   MONOABS 0.5 10/24/2012 1224   EOSABS 0.1 02/09/2021 0850   EOSABS 0.1 10/24/2012 1224   BASOSABS 0.0 02/09/2021 0850   BASOSABS 0.1 10/24/2012 1224   Comprehensive Metabolic Panel:    Component Value Date/Time   NA 135 02/09/2021 0850   NA 138 10/24/2012 1224   K 4.1 02/09/2021 0850   K 3.9 10/24/2012 1224   CL 100 02/09/2021 0850   CL 106 10/24/2012 1224   CO2 29 02/09/2021 0850   CO2 27 10/24/2012 1224   BUN 16 02/09/2021 0850   BUN 15 10/24/2012 1224   CREATININE 0.99 02/09/2021 0850   CREATININE 1.01 10/24/2012 1224   GLUCOSE 308 (H) 02/09/2021 0850   GLUCOSE 115 (H) 10/24/2012 1224   CALCIUM 8.6 (L) 02/09/2021 0850   CALCIUM 8.9 10/24/2012 1224   AST 28 02/09/2021 0850   AST 29 10/24/2012 1224   ALT 30 02/09/2021 0850   ALT 43 10/24/2012 1224   ALKPHOS 57 02/09/2021 0850   ALKPHOS 60 10/24/2012 1224   BILITOT 1.7 (H) 02/09/2021 0850   BILITOT 0.7 10/24/2012 1224   PROT 6.8 02/09/2021 0850   PROT  7.4 10/24/2012 1224   ALBUMIN 4.4 02/09/2021 0850   ALBUMIN 3.7 10/24/2012 1224     Present during today's visit: Patient only, seen in infusion  Start plan: Timothy Murray will start his eltrombopag today 02/11/21   Patient Education I spoke with patient for overview of new oral chemotherapy medication: Eltrombopag   Administration: Counseled patient on administration, dosing, side effects, monitoring, drug-food interactions, safe handling, storage, and disposal. Patient will take 1 tablet (50 mg total) by mouth  daily. Take on an empty stomach 1 hour before a meal or 2 hours after.  Side Effects: Side effects include but not limited to: N/V and diarrhea.    Adherence: After discussion with patient no patient barriers to medication adherence identified.  Reviewed with patient importance of keeping a medication schedule and plan for any missed doses.  Other:  Allopurinol stopped since patient is not proceeding with venetoclax at this time  Timothy Murray voiced understanding and appreciation. All questions answered. Medication handout provided.  Provided patient with [facility] phone number. Patient knows to call the office with questions or concerns. [facility] will continue to follow.  Patient expressed understanding and was in agreement with this plan. He also understands that He can call clinic at any time with any questions, concerns, or complaints.   Medication Access Issues: No issues, fills at [facility], medication will be delivered today  Follow-up plan: RTC next week for labs  Thank you for allowing me to participate in the care of this patient.   Time Total: 15 mins  Visit consisted of counseling and education on dealing with issues of symptom management in the setting of serious and potentially life-threatening illness.Greater than 50%  of this time was spent counseling and coordinating care related to the above  assessment and plan.  Signed by: Darl Pikes, PharmD, BCPS, Salley Slaughter, CPP Hematology/Oncology Clinical Pharmacist Practitioner [place]/DB/AP [facility] [phone]  02/11/2021 9:26 AM

## 2021-02-12 ENCOUNTER — Inpatient Hospital Stay: Payer: Medicare HMO

## 2021-02-12 ENCOUNTER — Ambulatory Visit: Payer: Medicare HMO | Admitting: Pharmacist

## 2021-02-12 VITALS — BP 131/55 | HR 88 | Temp 98.3°F | Resp 20

## 2021-02-12 DIAGNOSIS — Z79899 Other long term (current) drug therapy: Secondary | ICD-10-CM | POA: Diagnosis not present

## 2021-02-12 DIAGNOSIS — Z8505 Personal history of malignant neoplasm of liver: Secondary | ICD-10-CM | POA: Diagnosis not present

## 2021-02-12 DIAGNOSIS — Z87891 Personal history of nicotine dependence: Secondary | ICD-10-CM | POA: Diagnosis not present

## 2021-02-12 DIAGNOSIS — Z5111 Encounter for antineoplastic chemotherapy: Secondary | ICD-10-CM | POA: Diagnosis not present

## 2021-02-12 DIAGNOSIS — D46C Myelodysplastic syndrome with isolated del(5q) chromosomal abnormality: Secondary | ICD-10-CM | POA: Diagnosis not present

## 2021-02-12 DIAGNOSIS — Z923 Personal history of irradiation: Secondary | ICD-10-CM | POA: Diagnosis not present

## 2021-02-12 DIAGNOSIS — D709 Neutropenia, unspecified: Secondary | ICD-10-CM | POA: Diagnosis not present

## 2021-02-12 DIAGNOSIS — K7460 Unspecified cirrhosis of liver: Secondary | ICD-10-CM | POA: Diagnosis not present

## 2021-02-12 DIAGNOSIS — D469 Myelodysplastic syndrome, unspecified: Secondary | ICD-10-CM

## 2021-02-12 DIAGNOSIS — D6959 Other secondary thrombocytopenia: Secondary | ICD-10-CM | POA: Diagnosis not present

## 2021-02-12 MED ORDER — AZACITIDINE CHEMO SQ INJECTION
75.0000 mg/m2 | Freq: Once | INTRAMUSCULAR | Status: AC
  Administered 2021-02-12: 162.5 mg via SUBCUTANEOUS
  Filled 2021-02-12: qty 6.5

## 2021-02-12 MED ORDER — ONDANSETRON HCL 4 MG PO TABS
8.0000 mg | ORAL_TABLET | Freq: Once | ORAL | Status: AC
  Administered 2021-02-12: 8 mg via ORAL
  Filled 2021-02-12: qty 2

## 2021-02-12 NOTE — Patient Instructions (Signed)
[facility] CANCER CTR AT [place]   Discharge Instructions: Thank you for choosing [facility] to provide your oncology and hematology care.  If you have a lab appointment with the [facility], please go directly to the [facility] and check in at the registration area.   We strive to give you quality time with your provider. You may need to reschedule your appointment if you arrive late (15 or more minutes).  Arriving late affects you and other patients whose appointments are after yours.  Also, if you miss three or more appointments without notifying the office, you may be dismissed from the clinic at the providers discretion.      For prescription refill requests, have your pharmacy contact our office and allow 72 hours for refills to be completed.    Today you received the following chemotherapy and/or immunotherapy agents: Vidaza.      To help prevent nausea and vomiting after your treatment, we encourage you to take your nausea medication as directed.  BELOW ARE SYMPTOMS THAT SHOULD BE REPORTED IMMEDIATELY: *FEVER GREATER THAN 100.4 F (38 C) OR HIGHER *CHILLS OR SWEATING *NAUSEA AND VOMITING THAT IS NOT CONTROLLED WITH YOUR NAUSEA MEDICATION *UNUSUAL SHORTNESS OF BREATH *UNUSUAL BRUISING OR BLEEDING *URINARY PROBLEMS (pain or burning when urinating, or frequent urination) *BOWEL PROBLEMS (unusual diarrhea, constipation, pain near the anus) TENDERNESS IN MOUTH AND THROAT WITH OR WITHOUT PRESENCE OF ULCERS (sore throat, sores in mouth, or a toothache) UNUSUAL RASH, SWELLING OR PAIN  UNUSUAL VAGINAL DISCHARGE OR ITCHING   Items with * indicate a potential emergency and should be followed up as soon as possible or go to the Emergency Department if any problems should occur.  Please show the CHEMOTHERAPY ALERT CARD or IMMUNOTHERAPY ALERT CARD at check-in to the Emergency Department and triage nurse.  Should you have questions after your visit or need to  cancel or reschedule your appointment, please contact [facility] AT [place]  Dept: [phone]  and follow the prompts.  Office hours are 8:00 a.m. to 4:30 p.m. Monday - Friday. Please note that voicemails left after 4:00 p.m. may not be returned until the following business day.  We are closed weekends and major holidays. You have access to a nurse at all times for urgent questions. Please call the main number to the clinic Dept: [phone] and follow the prompts.  For any non-urgent questions, you may also contact your provider using MyChart. We now offer e-Visits for anyone 71 and older to request care online for non-urgent symptoms. For details visit [email or web address].   Also download the MyChart app! Go to the app store, search "MyChart", open the app, select [facility] [employer], and log in with your MyChart username and password.  Due to Covid, a mask is required upon entering the hospital/clinic. If you do not have a mask, one will be given to you upon arrival. For doctor visits, patients may have 1 support person aged 61 or older with them. For treatment visits, patients cannot have anyone with them due to current Covid guidelines and our immunocompromised population.

## 2021-02-12 NOTE — Telephone Encounter (Signed)
Appeal approved on 02/09/21.

## 2021-02-13 ENCOUNTER — Other Ambulatory Visit: Payer: Medicare HMO

## 2021-02-13 ENCOUNTER — Inpatient Hospital Stay: Payer: Medicare HMO

## 2021-02-13 ENCOUNTER — Ambulatory Visit: Payer: Medicare HMO | Admitting: Oncology

## 2021-02-13 ENCOUNTER — Other Ambulatory Visit: Payer: Self-pay

## 2021-02-13 VITALS — BP 133/65 | HR 93 | Temp 98.9°F | Resp 18

## 2021-02-13 DIAGNOSIS — Z79899 Other long term (current) drug therapy: Secondary | ICD-10-CM | POA: Diagnosis not present

## 2021-02-13 DIAGNOSIS — D46C Myelodysplastic syndrome with isolated del(5q) chromosomal abnormality: Secondary | ICD-10-CM | POA: Diagnosis not present

## 2021-02-13 DIAGNOSIS — D469 Myelodysplastic syndrome, unspecified: Secondary | ICD-10-CM

## 2021-02-13 DIAGNOSIS — K7460 Unspecified cirrhosis of liver: Secondary | ICD-10-CM | POA: Diagnosis not present

## 2021-02-13 DIAGNOSIS — Z87891 Personal history of nicotine dependence: Secondary | ICD-10-CM | POA: Diagnosis not present

## 2021-02-13 DIAGNOSIS — D6959 Other secondary thrombocytopenia: Secondary | ICD-10-CM | POA: Diagnosis not present

## 2021-02-13 DIAGNOSIS — D709 Neutropenia, unspecified: Secondary | ICD-10-CM | POA: Diagnosis not present

## 2021-02-13 DIAGNOSIS — Z5111 Encounter for antineoplastic chemotherapy: Secondary | ICD-10-CM | POA: Diagnosis not present

## 2021-02-13 DIAGNOSIS — Z8505 Personal history of malignant neoplasm of liver: Secondary | ICD-10-CM | POA: Diagnosis not present

## 2021-02-13 DIAGNOSIS — Z923 Personal history of irradiation: Secondary | ICD-10-CM | POA: Diagnosis not present

## 2021-02-13 MED ORDER — AZACITIDINE CHEMO SQ INJECTION
75.0000 mg/m2 | Freq: Once | INTRAMUSCULAR | Status: AC
  Administered 2021-02-13: 162.5 mg via SUBCUTANEOUS
  Filled 2021-02-13: qty 6.5

## 2021-02-13 MED ORDER — ONDANSETRON HCL 4 MG PO TABS
8.0000 mg | ORAL_TABLET | Freq: Once | ORAL | Status: AC
  Administered 2021-02-13: 8 mg via ORAL
  Filled 2021-02-13: qty 2

## 2021-02-13 NOTE — Patient Instructions (Signed)
[facility] CANCER CTR AT [place]  Discharge Instructions: Thank you for choosing [facility] to provide your oncology and hematology care.  If you have a lab appointment with the [facility], please go directly to the [facility] and check in at the registration area.  Wear comfortable clothing and clothing appropriate for easy access to any Portacath or PICC line.   We strive to give you quality time with your provider. You may need to reschedule your appointment if you arrive late (15 or more minutes).  Arriving late affects you and other patients whose appointments are after yours.  Also, if you miss three or more appointments without notifying the office, you may be dismissed from the clinic at the providers discretion.      For prescription refill requests, have your pharmacy contact our office and allow 72 hours for refills to be completed.    Today you received the following chemotherapy and/or immunotherapy agents VIDAZA      To help prevent nausea and vomiting after your treatment, we encourage you to take your nausea medication as directed.  BELOW ARE SYMPTOMS THAT SHOULD BE REPORTED IMMEDIATELY: *FEVER GREATER THAN 100.4 F (38 C) OR HIGHER *CHILLS OR SWEATING *NAUSEA AND VOMITING THAT IS NOT CONTROLLED WITH YOUR NAUSEA MEDICATION *UNUSUAL SHORTNESS OF BREATH *UNUSUAL BRUISING OR BLEEDING *URINARY PROBLEMS (pain or burning when urinating, or frequent urination) *BOWEL PROBLEMS (unusual diarrhea, constipation, pain near the anus) TENDERNESS IN MOUTH AND THROAT WITH OR WITHOUT PRESENCE OF ULCERS (sore throat, sores in mouth, or a toothache) UNUSUAL RASH, SWELLING OR PAIN  UNUSUAL VAGINAL DISCHARGE OR ITCHING   Items with * indicate a potential emergency and should be followed up as soon as possible or go to the Emergency Department if any problems should occur.  Please show the CHEMOTHERAPY ALERT CARD or IMMUNOTHERAPY ALERT CARD at check-in to the  Emergency Department and triage nurse.  Should you have questions after your visit or need to cancel or reschedule your appointment, please contact [facility] CANCER [place] AT [place]  [phone] and follow the prompts.  Office hours are 8:00 a.m. to 4:30 p.m. Monday - Friday. Please note that voicemails left after 4:00 p.m. may not be returned until the following business day.  We are closed weekends and major holidays. You have access to a nurse at all times for urgent questions. Please call the main number to the clinic [phone] and follow the prompts.  For any non-urgent questions, you may also contact your provider using MyChart. We now offer e-Visits for anyone 60 and older to request care online for non-urgent symptoms. For details visit [email or web address].   Also download the MyChart app! Go to the app store, search "MyChart", open the app, select [facility], and log in with your MyChart username and password.  Due to Covid, a mask is required upon entering the hospital/clinic. If you do not have a mask, one will be given to you upon arrival. For doctor visits, patients may have 1 support person aged 35 or older with them. For treatment visits, patients cannot have anyone with them due to current Covid guidelines and our immunocompromised population.   Azacitidine suspension for injection (subcutaneous use) What is this medication? AZACITIDINE (ay [place]) is a chemotherapy drug. This medicine reduces the growth of cancer cells and can suppress the immune system. It is used for treating myelodysplastic syndrome or some types of leukemia. This medicine may be used for other purposes; ask  your health care provider or pharmacist if you have questions. COMMON BRAND NAME(S): Vidaza What should I tell my care team before I take this medication? They need to know if you have any of these conditions: kidney disease liver disease liver tumors an unusual or allergic  reaction to azacitidine, mannitol, other medicines, foods, dyes, or preservatives pregnant or trying to get pregnant breast-feeding How should I use this medication? This medicine is for injection under the skin. It is administered in a hospital or clinic by a specially trained health care professional. Talk to your pediatrician regarding the use of this medicine in children. While this drug may be prescribed for selected conditions, precautions do apply. Overdosage: If you think you have taken too much of this medicine contact a poison control center or emergency room at once. NOTE: This medicine is only for you. Do not share this medicine with others. What if I miss a dose? It is important not to miss your dose. Call your doctor or health care professional if you are unable to keep an appointment. What may interact with this medication? Interactions have not been studied. Give your health care provider a list of all the medicines, herbs, non-prescription drugs, or dietary supplements you use. Also tell them if you smoke, drink alcohol, or use illegal drugs. Some items may interact with your medicine. This list may not describe all possible interactions. Give your health care provider a list of all the medicines, herbs, non-prescription drugs, or dietary supplements you use. Also tell them if you smoke, drink alcohol, or use illegal drugs. Some items may interact with your medicine. What should I watch for while using this medication? Visit your doctor for checks on your progress. This drug may make you feel generally unwell. This is not uncommon, as chemotherapy can affect healthy cells as well as cancer cells. Report any side effects. Continue your course of treatment even though you feel ill unless your doctor tells you to stop. In some cases, you may be given additional medicines to help with side effects. Follow all directions for their use. Call your doctor or health care professional for  advice if you get a fever, chills or sore throat, or other symptoms of a cold or flu. Do not treat yourself. This drug decreases your body's ability to fight infections. Try to avoid being around people who are sick. This medicine may increase your risk to bruise or bleed. Call your doctor or health care professional if you notice any unusual bleeding. You may need blood work done while you are taking this medicine. Do not become pregnant while taking this medicine and for 6 months after the last dose. Women should inform their doctor if they wish to become pregnant or think they might be pregnant. Men should not father a child while taking this medicine and for 3 months after the last dose. There is a potential for serious side effects to an unborn child. Talk to your health care professional or pharmacist for more information. Do not breast-feed an infant while taking this medicine and for 1 week after the last dose. This medicine may interfere with the ability to have a child. Talk with your doctor or health care professional if you are concerned about your fertility. What side effects may I notice from receiving this medication? Side effects that you should report to your doctor or health care professional as soon as possible: allergic reactions like skin rash, itching or hives, swelling of the face, lips,  or tongue low blood counts - this medicine may decrease the number of white blood cells, red blood cells and platelets. You may be at increased risk for infections and bleeding. signs of infection - fever or chills, cough, sore throat, pain passing urine signs of decreased platelets or bleeding - bruising, pinpoint red spots on the skin, black, tarry stools, blood in the urine signs of decreased red blood cells - unusually weak or tired, fainting spells, lightheadedness signs and symptoms of kidney injury like trouble passing urine or change in the amount of urine signs and symptoms of liver  injury like dark yellow or brown urine; general ill feeling or flu-like symptoms; light-colored stools; loss of appetite; nausea; right upper belly pain; unusually weak or tired; yellowing of the eyes or skin Side effects that usually do not require medical attention (report to your doctor or health care professional if they continue or are bothersome): constipation diarrhea nausea, vomiting pain or redness at the injection site unusually weak or tired This list may not describe all possible side effects. Call your doctor for medical advice about side effects. You may report side effects to FDA at 1-800-FDA-1088. Where should I keep my medication? This drug is given in a hospital or clinic and will not be stored at home. NOTE: This sheet is a summary. It may not cover all possible information. If you have questions about this medicine, talk to your doctor, pharmacist, or health care provider.  2022 Elsevier/Gold Standard (2016-01-21 00:00:00)

## 2021-02-16 ENCOUNTER — Inpatient Hospital Stay: Payer: Medicare HMO

## 2021-02-16 ENCOUNTER — Other Ambulatory Visit: Payer: Self-pay

## 2021-02-16 ENCOUNTER — Telehealth: Payer: Self-pay | Admitting: *Deleted

## 2021-02-16 ENCOUNTER — Other Ambulatory Visit: Payer: Self-pay | Admitting: Oncology

## 2021-02-16 DIAGNOSIS — D6959 Other secondary thrombocytopenia: Secondary | ICD-10-CM | POA: Diagnosis not present

## 2021-02-16 DIAGNOSIS — Z79899 Other long term (current) drug therapy: Secondary | ICD-10-CM | POA: Diagnosis not present

## 2021-02-16 DIAGNOSIS — Z8505 Personal history of malignant neoplasm of liver: Secondary | ICD-10-CM | POA: Diagnosis not present

## 2021-02-16 DIAGNOSIS — K7460 Unspecified cirrhosis of liver: Secondary | ICD-10-CM | POA: Diagnosis not present

## 2021-02-16 DIAGNOSIS — D709 Neutropenia, unspecified: Secondary | ICD-10-CM | POA: Diagnosis not present

## 2021-02-16 DIAGNOSIS — D696 Thrombocytopenia, unspecified: Secondary | ICD-10-CM

## 2021-02-16 DIAGNOSIS — Z5111 Encounter for antineoplastic chemotherapy: Secondary | ICD-10-CM | POA: Diagnosis not present

## 2021-02-16 DIAGNOSIS — D46C Myelodysplastic syndrome with isolated del(5q) chromosomal abnormality: Secondary | ICD-10-CM | POA: Diagnosis not present

## 2021-02-16 DIAGNOSIS — D469 Myelodysplastic syndrome, unspecified: Secondary | ICD-10-CM

## 2021-02-16 DIAGNOSIS — Z923 Personal history of irradiation: Secondary | ICD-10-CM | POA: Diagnosis not present

## 2021-02-16 DIAGNOSIS — Z87891 Personal history of nicotine dependence: Secondary | ICD-10-CM | POA: Diagnosis not present

## 2021-02-16 LAB — CBC WITH DIFFERENTIAL/PLATELET
Abs Immature Granulocytes: 0.01 10*3/uL (ref 0.00–0.07)
Basophils Absolute: 0 10*3/uL (ref 0.0–0.1)
Basophils Relative: 0 %
Eosinophils Absolute: 0.1 10*3/uL (ref 0.0–0.5)
Eosinophils Relative: 3 %
HCT: 33.8 % — ABNORMAL LOW (ref 39.0–52.0)
Hemoglobin: 12.3 g/dL — ABNORMAL LOW (ref 13.0–17.0)
Immature Granulocytes: 0 %
Lymphocytes Relative: 43 %
Lymphs Abs: 1 10*3/uL (ref 0.7–4.0)
MCH: 36.1 pg — ABNORMAL HIGH (ref 26.0–34.0)
MCHC: 36.4 g/dL — ABNORMAL HIGH (ref 30.0–36.0)
MCV: 99.1 fL (ref 80.0–100.0)
Monocytes Absolute: 0.2 10*3/uL (ref 0.1–1.0)
Monocytes Relative: 7 %
Neutro Abs: 1.1 10*3/uL — ABNORMAL LOW (ref 1.7–7.7)
Neutrophils Relative %: 47 %
Platelets: 12 10*3/uL — CL (ref 150–400)
RBC: 3.41 MIL/uL — ABNORMAL LOW (ref 4.22–5.81)
RDW: 13.6 % (ref 11.5–15.5)
Smear Review: NORMAL
WBC: 2.3 10*3/uL — ABNORMAL LOW (ref 4.0–10.5)
nRBC: 0 % (ref 0.0–0.2)

## 2021-02-16 MED ORDER — SODIUM CHLORIDE 0.9% FLUSH
3.0000 mL | INTRAVENOUS | Status: DC | PRN
  Filled 2021-02-16: qty 3

## 2021-02-16 MED ORDER — SODIUM CHLORIDE 0.9% IV SOLUTION
250.0000 mL | Freq: Once | INTRAVENOUS | Status: AC
  Administered 2021-02-16: 250 mL via INTRAVENOUS
  Filled 2021-02-16: qty 250

## 2021-02-16 NOTE — Patient Instructions (Addendum)
[facility]CER CTR AT [place]  Discharge Instructions: Thank you for choosing [facility] to provide your oncology and hematology care.  If you have a lab appointment with the [facility], please go directly to the [facility] and check in at the registration area.  Wear comfortable clothing and clothing appropriate for easy access to any Portacath or PICC line.   We strive to give you quality time with your provider. You may need to reschedule your appointment if you arrive late (15 or more minutes).  Arriving late affects you and other patients whose appointments are after yours.  Also, if you miss three or more appointments without notifying the office, you may be dismissed from the clinic at the providers discretion.      For prescription refill requests, have your pharmacy contact our office and allow 72 hours for refills to be completed.    Today you received the following chemotherapy and/or immunotherapy agents PLATELETS      To help prevent nausea and vomiting after your treatment, we encourage you to take your nausea medication as directed.  BELOW ARE SYMPTOMS THAT SHOULD BE REPORTED IMMEDIATELY: *FEVER GREATER THAN 100.4 F (38 C) OR HIGHER *CHILLS OR SWEATING *NAUSEA AND VOMITING THAT IS NOT CONTROLLED WITH YOUR NAUSEA MEDICATION *UNUSUAL SHORTNESS OF BREATH *UNUSUAL BRUISING OR BLEEDING *URINARY PROBLEMS (pain or burning when urinating, or frequent urination) *BOWEL PROBLEMS (unusual diarrhea, constipation, pain near the anus) TENDERNESS IN MOUTH AND THROAT WITH OR WITHOUT PRESENCE OF ULCERS (sore throat, sores in mouth, or a toothache) UNUSUAL RASH, SWELLING OR PAIN  UNUSUAL VAGINAL DISCHARGE OR ITCHING   Items with * indicate a potential emergency and should be followed up as soon as possible or go to the Emergency Department if any problems should occur.  Please show the CHEMOTHERAPY ALERT CARD or IMMUNOTHERAPY ALERT CARD at check-in to  the Emergency Department and triage nurse.  Should you have questions after your visit or need to cancel or reschedule your appointment, please contact [facility] CANCER [place] AT [place]  [phone] and follow the prompts.  Office hours are 8:00 a.m. to 4:30 p.m. Monday - Friday. Please note that voicemails left after 4:00 p.m. may not be returned until the following business day.  We are closed weekends and major holidays. You have access to a nurse at all times for urgent questions. Please call the main number to the clinic [phone] and follow the prompts.  For any non-urgent questions, you may also contact your provider using MyChart. We now offer e-Visits for anyone 52 and older to request care online for non-urgent symptoms. For details visit [email or web address].   Also download the MyChart app! Go to the app store, search "MyChart", open the app, select [facility], and log in with your MyChart username and password.  Due to Covid, a mask is required upon entering the hospital/clinic. If you do not have a mask, one will be given to you upon arrival. For doctor visits, patients may have 1 support person aged 62 or older with them. For treatment visits, patients cannot have anyone with them due to current Covid guidelines and our immunocompromised population. .  Thrombocytopenia Thrombocytopenia means that you have a low number of platelets in your blood. Platelets are tiny cells in the blood. When you bleed, they clump together at the cut or injury to stop the bleeding. This is called blood clotting. If you do not have enough platelets, your blood may have trouble clotting. This  may cause you to bleed and bruise very easily. What are the causes? This condition is caused by a low number of platelets in your blood. There are three main reasons for this: Your body not making enough platelets. This may be caused by: Bone marrow diseases. Disorders that are passed from parent to  child (inherited). Certain cancer medicines or treatments. Infection from germs (bacteria or viruses). Alcoholism. Platelets not being released in the blood. This can be caused by: Having a spleen that is larger than normal. A condition called Gaucher disease. Your body destroying platelets too quickly. This may be caused by: Certain autoimmune diseases. Some medicines that thin your blood. Certain blood clotting disorders. Certain bleeding disorders. Exposure to harmful (toxic) chemicals. Pregnancy. What are the signs or symptoms? Bruising easily. Bleeding from the nose or mouth. Heavy menstrual periods. Blood in the pee (urine), poop (stool), or vomit. A purple-red color to the skin (purpura). A rash that looks like pinpoint, purple-red spots (petechiae) on the lower legs. How is this treated? Treatment depends on the cause. Treatment may include: Treatment of another condition that is causing the low platelet count. Medicines to help protect your platelets from being destroyed. A replacement (transfusion) of platelets to stop or prevent bleeding. Surgery to take out the spleen. Follow these instructions at home: Medicines Take over-the-counter and prescription medicines only as told by your doctor. Do not take any medicines that have aspirin or NSAIDs, such as ibuprofen. Activity Avoid doing things that could hurt or bruise you. Take action to prevent falls. Do not play contact sports. Ask your doctor what activities are safe for you. Take care not to burn yourself: When you use an iron. When you cook. Take care not to cut yourself: When you shave. When you use scissors, needles, knives, or other tools. General instructions  Check your skin and the inside of your mouth for bruises or blood as told by your doctor. Wear a medical alert bracelet that says that you have a bleeding disorder. Check to see if there is blood in your pee and poop. Do this as told by your  doctor. Do not drink alcohol. If you do drink, limit the amount that you drink. Stay away from harmful (toxic) chemicals. Tell all of your doctors that you have this condition. Be sure to tell your dentist and eye doctor. Tell your dentist about your condition before you have your teeth cleaned. Keep all follow-up visits. Contact a doctor if: You have bruises and you do not know why. You have new symptoms. You have symptoms that get worse. You have a fever. Get help right away if: You have very bad bleeding anywhere on your body. You have blood in your vomit, pee, or poop. You have an injury to your head. You have a sudden, very bad headache. Summary Thrombocytopenia means that you have a low number of platelets in your blood. Platelets stick together to form a clot. Symptoms of this condition include getting bruises easily, bleeding from the mouth and nose, a purple-red color to the skin, and a rash. Take care not to cut or burn yourself. This information is not intended to replace advice given to you by your health care provider. Make sure you discuss any questions you have with your health care provider. Document Revised: 06/05/2020 Document Reviewed: 06/05/2020 Elsevier Patient Education  [place].

## 2021-02-16 NOTE — Telephone Encounter (Signed)
Went to infusion. Pt counts were too low to get chemo today but he is getting plt transfusion. He was speaking to nurse about constipation and he had to take enema to have BM. I told pt that he can take senokot and he is. I told him that he can take 2 in am and 2 in pm. He can also use miralax and the lid of the container is the measurer. You will pour the franules in the cup to the line on the cap. He can mix I with any liquid and drinks it . It usually works 1- 5 hours and you can take it up to 3 times a day. Dr. Janese Banks says do not use enema especially when your white count is low-carmel told him about the enema while he was in infusion

## 2021-02-16 NOTE — Progress Notes (Signed)
Per Dr Janese Banks r/t this am labs. Hold Vidaza today and tomorrow and transfuse platelets today. Dr Janese Banks also mad aware of pt issue with constipation over the weekend and giving self enema. Burna Sis RN at chairside to review prn Meds. Pt also made aware not to give self enemas r/t neutropenia.  Pt verbalized understanding.

## 2021-02-17 ENCOUNTER — Inpatient Hospital Stay: Payer: Medicare HMO

## 2021-02-17 ENCOUNTER — Telehealth: Payer: Self-pay | Admitting: *Deleted

## 2021-02-17 ENCOUNTER — Other Ambulatory Visit: Payer: Self-pay | Admitting: *Deleted

## 2021-02-17 DIAGNOSIS — D696 Thrombocytopenia, unspecified: Secondary | ICD-10-CM

## 2021-02-17 LAB — PREPARE PLATELET PHERESIS: Unit division: 0

## 2021-02-17 LAB — BPAM PLATELET PHERESIS
Blood Product Expiration Date: 202302142359
ISSUE DATE / TIME: 202302130920
Unit Type and Rh: 8400

## 2021-02-17 MED ORDER — LACTULOSE 10 GM/15ML PO SOLN: ORAL | 0 refills | Status: DC

## 2021-02-17 NOTE — Telephone Encounter (Signed)
Patient called asking for what he can do for his constipation. He states that he has tried senokot 2 per day and he tried Miralax this morning and he just can't get stool to pass his rectal opening. He states he was told he cannot use an enema. Please advise

## 2021-02-17 NOTE — Telephone Encounter (Signed)
Call returned to patient and advised of NP recommendation of liberalizing Senokot to 2 tabs twice a day with Miralax twice a day or using lactulose 15 - 30 ml daily as needed. He opted for the lactulose. Prescription sent to [facility]

## 2021-02-20 ENCOUNTER — Other Ambulatory Visit: Payer: Self-pay | Admitting: Oncology

## 2021-02-23 ENCOUNTER — Inpatient Hospital Stay: Payer: Medicare HMO

## 2021-02-23 ENCOUNTER — Other Ambulatory Visit (HOSPITAL_COMMUNITY): Payer: Self-pay

## 2021-02-23 ENCOUNTER — Other Ambulatory Visit: Payer: Medicare HMO

## 2021-02-23 ENCOUNTER — Encounter: Payer: Self-pay | Admitting: Oncology

## 2021-02-23 ENCOUNTER — Inpatient Hospital Stay (HOSPITAL_BASED_OUTPATIENT_CLINIC_OR_DEPARTMENT_OTHER): Payer: Medicare HMO | Admitting: Oncology

## 2021-02-23 ENCOUNTER — Telehealth: Payer: Self-pay | Admitting: Pharmacist

## 2021-02-23 ENCOUNTER — Other Ambulatory Visit: Payer: Self-pay

## 2021-02-23 VITALS — BP 104/63 | HR 72 | Temp 98.7°F | Resp 20 | Wt 202.5 lb

## 2021-02-23 DIAGNOSIS — D469 Myelodysplastic syndrome, unspecified: Secondary | ICD-10-CM

## 2021-02-23 DIAGNOSIS — Z79899 Other long term (current) drug therapy: Secondary | ICD-10-CM | POA: Diagnosis not present

## 2021-02-23 DIAGNOSIS — D6959 Other secondary thrombocytopenia: Secondary | ICD-10-CM | POA: Diagnosis not present

## 2021-02-23 DIAGNOSIS — Z87891 Personal history of nicotine dependence: Secondary | ICD-10-CM | POA: Diagnosis not present

## 2021-02-23 DIAGNOSIS — D696 Thrombocytopenia, unspecified: Secondary | ICD-10-CM

## 2021-02-23 DIAGNOSIS — Z5111 Encounter for antineoplastic chemotherapy: Secondary | ICD-10-CM | POA: Diagnosis not present

## 2021-02-23 DIAGNOSIS — K7460 Unspecified cirrhosis of liver: Secondary | ICD-10-CM | POA: Diagnosis not present

## 2021-02-23 DIAGNOSIS — Z8505 Personal history of malignant neoplasm of liver: Secondary | ICD-10-CM | POA: Diagnosis not present

## 2021-02-23 DIAGNOSIS — Z923 Personal history of irradiation: Secondary | ICD-10-CM | POA: Diagnosis not present

## 2021-02-23 DIAGNOSIS — D709 Neutropenia, unspecified: Secondary | ICD-10-CM | POA: Diagnosis not present

## 2021-02-23 DIAGNOSIS — D46C Myelodysplastic syndrome with isolated del(5q) chromosomal abnormality: Secondary | ICD-10-CM | POA: Diagnosis not present

## 2021-02-23 LAB — SAMPLE TO BLOOD BANK

## 2021-02-23 LAB — CBC WITH DIFFERENTIAL/PLATELET
Abs Immature Granulocytes: 0 10*3/uL (ref 0.00–0.07)
Basophils Absolute: 0 10*3/uL (ref 0.0–0.1)
Basophils Relative: 1 %
Eosinophils Absolute: 0 10*3/uL (ref 0.0–0.5)
Eosinophils Relative: 2 %
HCT: 28.2 % — ABNORMAL LOW (ref 39.0–52.0)
Hemoglobin: 10.2 g/dL — ABNORMAL LOW (ref 13.0–17.0)
Immature Granulocytes: 0 %
Lymphocytes Relative: 59 %
Lymphs Abs: 0.8 10*3/uL (ref 0.7–4.0)
MCH: 36.4 pg — ABNORMAL HIGH (ref 26.0–34.0)
MCHC: 36.2 g/dL — ABNORMAL HIGH (ref 30.0–36.0)
MCV: 100.7 fL — ABNORMAL HIGH (ref 80.0–100.0)
Monocytes Absolute: 0.1 10*3/uL (ref 0.1–1.0)
Monocytes Relative: 7 %
Neutro Abs: 0.4 10*3/uL — CL (ref 1.7–7.7)
Neutrophils Relative %: 31 %
Platelets: 10 10*3/uL — CL (ref 150–400)
RBC: 2.8 MIL/uL — ABNORMAL LOW (ref 4.22–5.81)
RDW: 13.9 % (ref 11.5–15.5)
WBC: 1.3 10*3/uL — CL (ref 4.0–10.5)
nRBC: 0 % (ref 0.0–0.2)

## 2021-02-23 MED ORDER — ACETAMINOPHEN 325 MG PO TABS
650.0000 mg | ORAL_TABLET | Freq: Once | ORAL | Status: AC
  Administered 2021-02-23: 650 mg via ORAL
  Filled 2021-02-23: qty 2

## 2021-02-23 MED ORDER — SODIUM CHLORIDE 0.9% IV SOLUTION
250.0000 mL | Freq: Once | INTRAVENOUS | Status: AC
  Administered 2021-02-23: 250 mL via INTRAVENOUS
  Filled 2021-02-23: qty 250

## 2021-02-23 MED ORDER — ELTROMBOPAG OLAMINE 75 MG PO TABS
75.0000 mg | ORAL_TABLET | Freq: Every day | ORAL | 0 refills | Status: DC
  Filled 2021-02-23: qty 30, 30d supply, fill #0

## 2021-02-23 NOTE — Progress Notes (Signed)
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 [place] s/p thermal ablation  Chief complaint/ Reason for visit-routine follow-up of MDS  Heme/Onc history: Patient is a 76 year old male with a history of cirrhosis and hepatocellular carcinoma for which she had proton beam radiation In June 2021.  He has baseline thrombocytopenia which was initially attributed to his cirrhosis.  He was found to have progressive pancytopenia with a steady decline in his white cell count since March 2022 when it was down to 3.4 with a platelet count of 53.  He was hospitalized sometime in April 2022 with acute biliary gallstone pancreatitis .  He underwent ERCP inpatient with platelet transfusion which showed sludge and biliary sphincterotomy was performed.  His bilirubin which was elevated up to 7.2 has come down to 2.4 presently.   However his pancytopenia has continued to worsen and presently white count is 1.1 with an H&H of 6.2/17.6 with an MCV of 102 and a platelet count of 33.Bone marrow biopsy showed evidence of dyspoietic changes in the erythroid and megakaryocyte excel lines associated with ring sideroblasts.  Definite increase in blastic cells not identified.  Overall features favor myelodysplastic syndrome.  Karyotype was normal but only for metaphase cells were analyzed which were not sufficient.  [employer] FISH MDS panel showed deletion of 5 q. along with monosomy 7 and deletion 7 q. without excess blasts.  SF3B1 mutation negative.   Peripheral blood intellegin myeloid panel showed :   Detected     Change        Frequency (%) Impact  TP53   c.716A>C     p.Asn239Thr  19            Tier I  U2AF1  c.470A>C     p.Gln157Pro  17            Tier I  IDH1   c.315C>T     p.Gly105Gly  17            Tier II      With regards to his hepatocellular carcinoma patient had an MRI abdomen on 04/24/2020 which showed that his primary liver mass which was previously 4.3 x 3.6 cmWas similar in size at 4.5 x 3.5 cm.    Patient was briefly on Revlimid for 5 q. deletion but after T p53 came back he was switched to [place] for 5 days every 28 days starting 05/27/2020    Patient was seen by Dr. Marvel Plan at [facility] for second opinion and he also had a repeat bone marrow biopsyDone on 08/14/2020.  Biopsy showed normocellular bone marrow with erythroid predominant trilineage hematopoiesis.  Megakaryocytes were typical in number with few hypolobated forms.  Full sequence of orderly maturation noted in both granulopoiesis and erythropoiesis.  3% blasts were seen by manual differential.   Plan is to continue Vidaza until progression or toxicity which was started in May 2022.  Promacta started in February 2023 for worsening thrombocytopenia  Interval history-other than mild fatigue patient denies any complaints at this time.  Denies any bleeding or bruising.  He is currently on Promacta 50 mg daily for the  last 2 weeks.  ECOG PS- 1 Pain scale- 0   Review of systems- Review of Systems  Constitutional:  Positive for malaise/fatigue. Negative for chills, fever and weight loss.  HENT:  Negative for congestion, ear discharge and nosebleeds.   Eyes:  Negative for blurred vision.  Respiratory:  Negative for cough, hemoptysis, sputum production, shortness of breath and wheezing.   Cardiovascular:  Negative for chest pain, palpitations, orthopnea and claudication.  Gastrointestinal:  Negative for abdominal pain, blood in stool, constipation, diarrhea, heartburn, melena, nausea and vomiting.  Genitourinary:  Negative for dysuria, flank pain,  frequency, hematuria and urgency.  Musculoskeletal:  Negative for back pain, joint pain and myalgias.  Skin:  Negative for rash.  Neurological:  Negative for dizziness, tingling, focal weakness, seizures, weakness and headaches.  Endo/Heme/Allergies:  Does not bruise/bleed easily.  Psychiatric/Behavioral:  Negative for depression and suicidal ideas. The patient does not have insomnia.       Allergies  Allergen Reactions   Dome-Paste Bandage [Wound Dressings]     All bandaids cause a rash   Soap & Cleansers     "liquid soap" antibacterial -rash   Latex Rash and Itching     Past Medical History:  Diagnosis Date   Arthritis    "lower back" (10/24/2012)   Coronary artery disease    GERD (gastroesophageal reflux disease)    High cholesterol    Hypertension    Liver cancer ([place])    MDS (myelodysplastic syndrome) ([place])    Type II diabetes mellitus ([place])      Past Surgical History:  Procedure Laterality Date   APPENDECTOMY  2002   CARDIAC CATHETERIZATION  10/24/2012   CORONARY ARTERY BYPASS GRAFT N/A 10/26/2012   Procedure: CORONARY ARTERY BYPASS GRAFTING (CABG);  Surgeon: Melrose Nakayama, MD;  Location: [facility];  Service: Open Heart Surgery;  Laterality: N/A;  Times 3 using left internal mammary artery and endoscopically harvested right saphenous vein   ERCP N/A 04/25/2020   Procedure: ENDOSCOPIC RETROGRADE CHOLANGIOPANCREATOGRAPHY (ERCP);  Surgeon: Lucilla Lame, MD;  Location: [facility] ENDOSCOPY;  Service: Endoscopy;  Laterality: N/A;   IR BONE MARROW BIOPSY & ASPIRATION  01/26/2021    Social History   Socioeconomic History   Marital status: Married    Spouse name: Not on file   Number of children: Not on file   Years of education: Not on file   Highest education level: Not on file  Occupational History   Not on file  Tobacco Use   Smoking status: Former    Packs/day: 0.50    Years: 30.00    Pack years: 15.00    Types: Cigarettes    Quit date: 08/28/2001    Years  since quitting: 19.5   Smokeless tobacco: Never  Vaping Use   Vaping Use: Never used  Substance and Sexual Activity   Alcohol use: No    Alcohol/week: 0.0 standard drinks    Comment: 10/24/2012 "stopped drinking alcohol in 2003; used to drink ~ 9 bottles beer/wk"   Drug use: No   Sexual activity: Yes  Other Topics Concern   Not on file  Social History Narrative   Not on file   Social Determinants of Health   Financial Resource Strain: Not on file  Food Insecurity: Not on file  Transportation Needs: Not on file  Physical Activity: Not on file  Stress: Not on file  Social Connections: Not on file  Intimate Partner Violence: Not on file    Family History  Problem  Relation Age of Onset   Cancer Sister      Current Outpatient Medications:    acyclovir (ZOVIRAX) 400 MG tablet, TAKE 1 TABLET BY MOUTH TWICE A DAY, Disp: 60 tablet, Rfl: 1   atorvastatin (LIPITOR) 40 MG tablet, Take by mouth., Disp: , Rfl:    Blood Glucose Monitoring Suppl ([place]) w/Device KIT, 1 each by Does not apply route in the morning and at bedtime., Disp: , Rfl:    eltrombopag (PROMACTA) 50 MG tablet, Take 1 tablet (50 mg total) by mouth daily. Take on an empty stomach 1 hour before a meal or 2 hours after, Disp: 30 tablet, Rfl: 0   FLUZONE HIGH-DOSE QUADRIVALENT 0.7 ML SUSY, Inject 1 each into the skin once., Disp: , Rfl:    glimepiride (AMARYL) 1 MG tablet, Take 1 mg by mouth daily., Disp: , Rfl:    lactulose (CHRONULAC) 10 GM/15ML solution, Take 15 - 30 ml daily as needed for constipation, Disp: 300 mL, Rfl: 0   Lancets (ONETOUCH DELICA PLUS [id number]) MISC, Apply 1 each topically 2 (two) times daily., Disp: , Rfl:    levofloxacin (LEVAQUIN) 500 MG tablet, TAKE 1 TABLET (500 MG TOTAL) BY MOUTH DAILY., Disp: 30 tablet, Rfl: 0   loratadine (CLARITIN) 10 MG tablet, Take 5 mg by mouth daily as needed. Take half a tablet (7m) daily, Disp: , Rfl:    metoprolol tartrate (LOPRESSOR) 25 MG  tablet, Take 25 mg by mouth daily., Disp: , Rfl:    omeprazole (PRILOSEC) 40 MG capsule, TAKE 1 CAPSULE BY MOUTH TWICE A DAY, Disp: 180 capsule, Rfl: 1   ONETOUCH VERIO test strip, 1 each by Other route 2 (two) times daily., Disp: , Rfl:    oxyCODONE (OXY IR/ROXICODONE) 5 MG immediate release tablet, Take 1 tablet (5 mg total) by mouth every 8 (eight) hours as needed for severe pain., Disp: 90 tablet, Rfl: 0   senna-docusate (SENOKOT-S) 8.6-50 MG tablet, Take 1 tablet by mouth every morning., Disp: , Rfl:    traZODone (DESYREL) 50 MG tablet, TAKE 1 TO 2 TABLETS BY MOUTH AT BEDTIME, Disp: 180 tablet, Rfl: 1   gabapentin (NEURONTIN) 300 MG capsule, Take 1 capsule (300 mg total) by mouth at bedtime. (Patient not taking: Reported on 09/15/2020), Disp: 30 capsule, Rfl: 0   LORazepam (ATIVAN) 0.5 MG tablet, Take 1 tablet (0.5 mg total) by mouth every 6 (six) hours as needed (Nausea or vomiting). (Patient not taking: Reported on 06/05/2020), Disp: 30 tablet, Rfl: 0   losartan (COZAAR) 25 MG tablet, Take 25 mg by mouth daily. (Patient not taking: Reported on 09/15/2020), Disp: , Rfl:    nitroGLYCERIN (NITROSTAT) 0.3 MG SL tablet, nitroglycerin 0.3 mg sublingual tablet  PLACE ONE TABLET (0.3 MG DOSE) UNDER THE TONGUE EVERY 5 (FIVE) MINUTES AS NEEDED FOR CHEST PAIN. (Patient not taking: Reported on 06/23/2020), Disp: , Rfl:    ondansetron (ZOFRAN) 8 MG tablet, Take 1 tablet (8 mg total) by mouth 2 (two) times daily as needed (Nausea or vomiting). (Patient not taking: Reported on 06/05/2020), Disp: 30 tablet, Rfl: 1   predniSONE (DELTASONE) 20 MG tablet, TAKE 1 TABLET BY MOUTH DAILY X 5 DAYS IN AM WITH FOOD (Patient not taking: Reported on 09/15/2020), Disp: , Rfl:    prochlorperazine (COMPAZINE) 10 MG tablet, Take 1 tablet (10 mg total) by mouth every 6 (six) hours as needed (Nausea or vomiting). (Patient not taking: Reported on 09/15/2020), Disp: 30 tablet, Rfl: 1  Physical exam:  Vitals:   02/23/21 1034  BP: 104/63   Pulse: 72  Resp: 20  Temp: 98.7 F (37.1 C)  SpO2: 100%  Weight: 202 lb 8 oz (91.9 kg)   Physical Exam Constitutional:      General: He is not in acute distress. Cardiovascular:     Rate and Rhythm: Normal rate and regular rhythm.     Heart sounds: Normal heart sounds.  Pulmonary:     Effort: Pulmonary effort is normal.     Breath sounds: Normal breath sounds.  Abdominal:     General: Bowel sounds are normal.     Palpations: Abdomen is soft.  Skin:    General: Skin is warm and dry.  Neurological:     Mental Status: He is alert and oriented to person, place, and time.     CMP Latest Ref Rng & Units 02/09/2021  Glucose 70 - 99 mg/dL 308(H)  BUN 8 - 23 mg/dL 16  Creatinine 0.61 - 1.24 mg/dL 0.99  Sodium 135 - 145 mmol/L 135  Potassium 3.5 - 5.1 mmol/L 4.1  Chloride 98 - 111 mmol/L 100  CO2 22 - 32 mmol/L 29  Calcium 8.9 - 10.3 mg/dL 8.6(L)  Total Protein 6.5 - 8.1 g/dL 6.8  Total Bilirubin 0.3 - 1.2 mg/dL 1.7(H)  Alkaline Phos 38 - 126 U/L 57  AST 15 - 41 U/L 28  ALT 0 - 44 U/L 30   CBC Latest Ref Rng & Units 02/23/2021  WBC 4.0 - 10.5 K/uL 1.3(LL)  Hemoglobin 13.0 - 17.0 g/dL 10.2(L)  Hematocrit 39.0 - 52.0 % 28.2(L)  Platelets 150 - 400 K/uL 10(LL)    No images are attached to the encounter.  IR BONE MARROW BIOPSY & ASPIRATION  Result Date: 01/26/2021 CLINICAL DATA:  Cirrhosis, splenomegaly, history of myelodysplastic syndrome EXAM: FLUORO GUIDED DEEP ILIAC BONE ASPIRATION AND CORE BIOPSY TECHNIQUE: Patient was placed prone on the fluoroscopy table in IR and an appropriate approach was selected parallel to the SI joint on the left. Appropriate skin entry site was identified. Skin site was marked, prepped with chlorhexidine, draped in usual sterile fashion, and infiltrated locally with 1% lidocaine. Intravenous Fentanyl 31mg and Versed 167mwere administered as conscious sedation during continuous monitoring of the patient's level of consciousness and physiological /  cardiorespiratory status by the radiology RN, with a total moderate sedation time of 10 minutes. Under fluoroscopic guidance an 11-gauge Cook trocar bone needle was advanced into the left iliac bone just lateral to the sacroiliac joint. Once needle tip position was confirmed, core and aspiration samples were obtained, submitted to pathology for approval. Patient tolerated procedure well. COMPLICATIONS: COMPLICATIONS none IMPRESSION: 1. Technically successful fluoroscopy guided left iliac bone core and aspiration biopsy. Electronically Signed   By: D Lucrezia Europe.D.   On: 01/26/2021 13:26     Assessment and plan- Patient is a [age].o. male with high risk MDS s/p 9 cycles of Vidaza here for routine follow-up  Promacta was started about 2 weeks ago.  Platelet counts remain low at 10.  He is now 2 weeks post Vidaza which she received 5 days a week at 75 mg per metered square. As per ASPIRE phase 2  study the dose of eltrombopag could be increased up to 300 mg/day but up to 150 mg/day for patients of [place] heritage.  We will therefore increase the dose of his Promacta to 75 mg today.  He will receive 1 unit of platelet transfusion today and return to  clinic in 1 week With CBC with differential and hold tube for possible platelet transfusion.  I will see him back in 2 weeks with labs for possible Vidaza and/or platelet transfusion.  Given his severe thrombocytopenia I have again reviewed fall precautions with patient.  He also has an upcoming appointment at [facility] in about 3 weeks time  Visit Diagnosis 1. Thrombocytopenia ([place])   2. MDS (myelodysplastic syndrome) ([place])      Dr. Randa Evens, MD, MPH [facility] [phone] 02/23/2021 1:29 PM

## 2021-02-23 NOTE — Telephone Encounter (Signed)
Oral Chemotherapy Pharmacist Encounter   Called [name] to discuss the dose increase in his Promacta (eltrombopag), he will go from taking 50mg  daily to 75mg  daily. His medication should arrive on 02/25/21 likely in the afternoon. [name] likes to take his eltrombopag at 10am so he will wait until 02/26/21 to get started on the 75mg  dose increase.   [name] stated his understanding of the plan.   Darl Pikes, PharmD, BCPS, BCOP, CPP Hematology/Oncology Clinical Pharmacist [place]/DB/AP [facility] [phone]  02/23/2021 3:12 PM

## 2021-02-23 NOTE — Progress Notes (Signed)
Patient states he is having a headache every morning when he wakes up. Headaches will last about 2 hours.

## 2021-02-24 ENCOUNTER — Ambulatory Visit: Payer: Medicare HMO

## 2021-02-24 ENCOUNTER — Other Ambulatory Visit (HOSPITAL_COMMUNITY): Payer: Self-pay

## 2021-02-24 ENCOUNTER — Ambulatory Visit: Payer: Medicare HMO | Admitting: Oncology

## 2021-02-24 LAB — PREPARE PLATELET PHERESIS: Unit division: 0

## 2021-02-24 LAB — BPAM PLATELET PHERESIS
Blood Product Expiration Date: 202302232359
ISSUE DATE / TIME: 202302201151
Unit Type and Rh: 6200

## 2021-02-26 ENCOUNTER — Other Ambulatory Visit: Payer: Self-pay | Admitting: *Deleted

## 2021-02-26 ENCOUNTER — Telehealth: Payer: Self-pay | Admitting: Oncology

## 2021-02-26 ENCOUNTER — Telehealth: Payer: Self-pay | Admitting: *Deleted

## 2021-02-26 ENCOUNTER — Other Ambulatory Visit (HOSPITAL_COMMUNITY): Payer: Self-pay

## 2021-02-26 DIAGNOSIS — I2510 Atherosclerotic heart disease of native coronary artery without angina pectoris: Secondary | ICD-10-CM

## 2021-02-26 DIAGNOSIS — I959 Hypotension, unspecified: Secondary | ICD-10-CM

## 2021-02-26 NOTE — Telephone Encounter (Addendum)
I have faxed ref to dr. Erin Fulling office. I have attached dr Janese Banks last note and the last not from cardiac md from 02/2020. The transmission was complete and cardiac Md office will call with appt.

## 2021-02-26 NOTE — Telephone Encounter (Signed)
Not sure how easy that will be but we can place a referral

## 2021-02-26 NOTE — Telephone Encounter (Signed)
Patient called and requested a referral to a new Cardiologist ( no longer wants to see Dr. Humphrey Rolls). He has been doing research and would like to see Dr. Silvio Clayman at [facility].   Routing to clinical team.

## 2021-02-26 NOTE — Telephone Encounter (Signed)
Patient called Timothy Murray and told her he was cold all over and dark urine over night and he was urinating every 1 hour. I spoke to Janese Banks and she recommended that pt got seen for [facility] . I called the pt and he was agreeable for the appt tom 1 pm

## 2021-02-27 ENCOUNTER — Emergency Department: Payer: Medicare HMO

## 2021-02-27 ENCOUNTER — Other Ambulatory Visit: Payer: Self-pay

## 2021-02-27 ENCOUNTER — Inpatient Hospital Stay (HOSPITAL_BASED_OUTPATIENT_CLINIC_OR_DEPARTMENT_OTHER): Payer: Medicare HMO | Admitting: Hospice and Palliative Medicine

## 2021-02-27 ENCOUNTER — Other Ambulatory Visit: Payer: Self-pay | Admitting: Oncology

## 2021-02-27 ENCOUNTER — Emergency Department
Admission: EM | Admit: 2021-02-27 | Discharge: 2021-02-27 | Disposition: A | Discharge status: Home / Self Care | Payer: Medicare HMO | Attending: Emergency Medicine | Admitting: Emergency Medicine

## 2021-02-27 VITALS — BP 122/77 | HR 74 | Temp 100.2°F | Resp 16

## 2021-02-27 DIAGNOSIS — R509 Fever, unspecified: Secondary | ICD-10-CM

## 2021-02-27 DIAGNOSIS — D6959 Other secondary thrombocytopenia: Secondary | ICD-10-CM | POA: Diagnosis not present

## 2021-02-27 DIAGNOSIS — D696 Thrombocytopenia, unspecified: Secondary | ICD-10-CM

## 2021-02-27 DIAGNOSIS — D469 Myelodysplastic syndrome, unspecified: Secondary | ICD-10-CM

## 2021-02-27 DIAGNOSIS — I10 Essential (primary) hypertension: Secondary | ICD-10-CM | POA: Insufficient documentation

## 2021-02-27 DIAGNOSIS — Z5111 Encounter for antineoplastic chemotherapy: Secondary | ICD-10-CM | POA: Diagnosis not present

## 2021-02-27 DIAGNOSIS — K7460 Unspecified cirrhosis of liver: Secondary | ICD-10-CM | POA: Insufficient documentation

## 2021-02-27 DIAGNOSIS — R350 Frequency of micturition: Secondary | ICD-10-CM | POA: Insufficient documentation

## 2021-02-27 DIAGNOSIS — E119 Type 2 diabetes mellitus without complications: Secondary | ICD-10-CM | POA: Insufficient documentation

## 2021-02-27 DIAGNOSIS — I2510 Atherosclerotic heart disease of native coronary artery without angina pectoris: Secondary | ICD-10-CM | POA: Insufficient documentation

## 2021-02-27 DIAGNOSIS — C229 Malignant neoplasm of liver, not specified as primary or secondary: Secondary | ICD-10-CM | POA: Diagnosis not present

## 2021-02-27 DIAGNOSIS — C169 Malignant neoplasm of stomach, unspecified: Secondary | ICD-10-CM | POA: Diagnosis not present

## 2021-02-27 DIAGNOSIS — T451X5A Adverse effect of antineoplastic and immunosuppressive drugs, initial encounter: Secondary | ICD-10-CM

## 2021-02-27 DIAGNOSIS — Z20822 Contact with and (suspected) exposure to covid-19: Secondary | ICD-10-CM | POA: Diagnosis not present

## 2021-02-27 DIAGNOSIS — D46C Myelodysplastic syndrome with isolated del(5q) chromosomal abnormality: Secondary | ICD-10-CM | POA: Diagnosis not present

## 2021-02-27 DIAGNOSIS — Z923 Personal history of irradiation: Secondary | ICD-10-CM | POA: Diagnosis not present

## 2021-02-27 DIAGNOSIS — D709 Neutropenia, unspecified: Secondary | ICD-10-CM | POA: Diagnosis not present

## 2021-02-27 DIAGNOSIS — D701 Agranulocytosis secondary to cancer chemotherapy: Secondary | ICD-10-CM | POA: Insufficient documentation

## 2021-02-27 DIAGNOSIS — Z87891 Personal history of nicotine dependence: Secondary | ICD-10-CM | POA: Diagnosis not present

## 2021-02-27 DIAGNOSIS — Z8505 Personal history of malignant neoplasm of liver: Secondary | ICD-10-CM | POA: Diagnosis not present

## 2021-02-27 DIAGNOSIS — Z79899 Other long term (current) drug therapy: Secondary | ICD-10-CM | POA: Diagnosis not present

## 2021-02-27 LAB — CBC WITH DIFFERENTIAL/PLATELET
Abs Immature Granulocytes: 0.07 10*3/uL (ref 0.00–0.07)
Abs Immature Granulocytes: 0.01 10*3/uL (ref 0.00–0.07)
Basophils Absolute: 0 10*3/uL (ref 0.0–0.1)
Basophils Absolute: 0 10*3/uL (ref 0.0–0.1)
Basophils Relative: 1 %
Basophils Relative: 0 %
Eosinophils Absolute: 0 10*3/uL (ref 0.0–0.5)
Eosinophils Absolute: 0 10*3/uL (ref 0.0–0.5)
Eosinophils Relative: 1 %
Eosinophils Relative: 1 %
HCT: 27.9 % — ABNORMAL LOW (ref 39.0–52.0)
HCT: 27 % — ABNORMAL LOW (ref 39.0–52.0)
Hemoglobin: 10.2 g/dL — ABNORMAL LOW (ref 13.0–17.0)
Hemoglobin: 9.5 g/dL — ABNORMAL LOW (ref 13.0–17.0)
Immature Granulocytes: 7 %
Immature Granulocytes: 1 %
Lymphocytes Relative: 70 %
Lymphocytes Relative: 67 %
Lymphs Abs: 0.7 10*3/uL (ref 0.7–4.0)
Lymphs Abs: 0.6 10*3/uL — ABNORMAL LOW (ref 0.7–4.0)
MCH: 36.4 pg — ABNORMAL HIGH (ref 26.0–34.0)
MCH: 35.2 pg — ABNORMAL HIGH (ref 26.0–34.0)
MCHC: 36.6 g/dL — ABNORMAL HIGH (ref 30.0–36.0)
MCHC: 35.2 g/dL (ref 30.0–36.0)
MCV: 99.6 fL (ref 80.0–100.0)
MCV: 100 fL (ref 80.0–100.0)
Monocytes Absolute: 0.1 10*3/uL (ref 0.1–1.0)
Monocytes Absolute: 0.1 10*3/uL (ref 0.1–1.0)
Monocytes Relative: 6 %
Monocytes Relative: 7 %
Neutro Abs: 0.2 10*3/uL — CL (ref 1.7–7.7)
Neutro Abs: 0.2 10*3/uL — CL (ref 1.7–7.7)
Neutrophils Relative %: 15 %
Neutrophils Relative %: 24 %
Platelets: 12 10*3/uL — CL (ref 150–400)
Platelets: 12 10*3/uL — CL (ref 150–400)
RBC: 2.8 MIL/uL — ABNORMAL LOW (ref 4.22–5.81)
RBC: 2.7 MIL/uL — ABNORMAL LOW (ref 4.22–5.81)
RDW: 14.4 % (ref 11.5–15.5)
RDW: 14.6 % (ref 11.5–15.5)
Smear Review: NORMAL
Smear Review: DECREASED
WBC: 1.1 10*3/uL — CL (ref 4.0–10.5)
WBC: 0.9 10*3/uL — CL (ref 4.0–10.5)
nRBC: 0 % (ref 0.0–0.2)
nRBC: 0 % (ref 0.0–0.2)

## 2021-02-27 LAB — COMPREHENSIVE METABOLIC PANEL
ALT: 31 U/L (ref 0–44)
ALT: 35 U/L (ref 0–44)
AST: 31 U/L (ref 15–41)
AST: 34 U/L (ref 15–41)
Albumin: 4.1 g/dL (ref 3.5–5.0)
Albumin: 4.1 g/dL (ref 3.5–5.0)
Alkaline Phosphatase: 68 U/L (ref 38–126)
Alkaline Phosphatase: 63 U/L (ref 38–126)
Anion gap: 7 (ref 5–15)
Anion gap: 7 (ref 5–15)
BUN: 14 mg/dL (ref 8–23)
BUN: 14 mg/dL (ref 8–23)
CO2: 28 mmol/L (ref 22–32)
CO2: 26 mmol/L (ref 22–32)
Calcium: 8.7 mg/dL — ABNORMAL LOW (ref 8.9–10.3)
Calcium: 8.4 mg/dL — ABNORMAL LOW (ref 8.9–10.3)
Chloride: 99 mmol/L (ref 98–111)
Chloride: 101 mmol/L (ref 98–111)
Creatinine, Ser: 0.93 mg/dL (ref 0.61–1.24)
Creatinine, Ser: 0.89 mg/dL (ref 0.61–1.24)
GFR, Estimated: >60 mL/min (ref >60)
GFR, Estimated: >60 mL/min (ref >60)
Glucose, Bld: 141 mg/dL — ABNORMAL HIGH (ref 70–99)
Glucose, Bld: 180 mg/dL — ABNORMAL HIGH (ref 70–99)
Potassium: 3.9 mmol/L (ref 3.5–5.1)
Potassium: 4.3 mmol/L (ref 3.5–5.1)
Sodium: 134 mmol/L — ABNORMAL LOW (ref 135–145)
Sodium: 134 mmol/L — ABNORMAL LOW (ref 135–145)
Total Bilirubin: 1.1 mg/dL (ref 0.3–1.2)
Total Bilirubin: 2 mg/dL — ABNORMAL HIGH (ref 0.3–1.2)
Total Protein: 7 g/dL (ref 6.5–8.1)
Total Protein: 7.1 g/dL (ref 6.5–8.1)

## 2021-02-27 LAB — URINALYSIS, ROUTINE W REFLEX MICROSCOPIC
Bilirubin Urine: NEGATIVE
Glucose, UA: NEGATIVE mg/dL
Hgb urine dipstick: NEGATIVE
Ketones, ur: NEGATIVE mg/dL
Leukocytes,Ua: NEGATIVE
Nitrite: NEGATIVE
Protein, ur: NEGATIVE mg/dL
Specific Gravity, Urine: 1.011 (ref 1.005–1.030)
pH: 6 (ref 5.0–8.0)

## 2021-02-27 LAB — URINALYSIS, COMPLETE (UACMP) WITH MICROSCOPIC
Bacteria, UA: NONE SEEN
Bilirubin Urine: NEGATIVE
Glucose, UA: NEGATIVE mg/dL
Hgb urine dipstick: NEGATIVE
Ketones, ur: NEGATIVE mg/dL
Leukocytes,Ua: NEGATIVE
Nitrite: NEGATIVE
Protein, ur: NEGATIVE mg/dL
Specific Gravity, Urine: 1.009 (ref 1.005–1.030)
Squamous Epithelial / HPF: NONE SEEN (ref 0–5)
WBC, UA: NONE SEEN WBC/hpf (ref 0–5)
pH: 7 (ref 5.0–8.0)

## 2021-02-27 LAB — RESP PANEL BY RT-PCR (FLU A&B, COVID) ARPGX2
Influenza A by PCR: NEGATIVE
Influenza B by PCR: NEGATIVE
SARS Coronavirus 2 by RT PCR: NEGATIVE

## 2021-02-27 LAB — LACTIC ACID, PLASMA
Lactic Acid, Venous: 2 mmol/L (ref 0.5–1.9)
Lactic Acid, Venous: 2.1 mmol/L (ref 0.5–1.9)
Lactic Acid, Venous: 1.2 mmol/L (ref 0.5–1.9)

## 2021-02-27 LAB — PROTIME-INR
INR: 1.1 (ref 0.8–1.2)
Prothrombin Time: 14.2 seconds (ref 11.4–15.2)

## 2021-02-27 MED ORDER — LACTATED RINGERS IV BOLUS
1000.0000 mL | Freq: Once | INTRAVENOUS | Status: AC
  Administered 2021-02-27: 1000 mL via INTRAVENOUS

## 2021-02-27 NOTE — ED Notes (Signed)
Pt walked independently to the bathroom to give urine sample at this time.

## 2021-02-27 NOTE — ED Triage Notes (Signed)
Pt to ED via POV from cancer center for possible sepsis. Pt started running a fever of 102 and was sent to ED for evaluation. Pt currently getting chemo injections and antibiotics. Pt with hx liver cancer, HTN, DM. Pt denies N/V/D, CP or SOB

## 2021-02-27 NOTE — Progress Notes (Signed)
Pt in [facility] today with reports of chills and brown urine. Denies dysuria. T-max in clinic 100.2.

## 2021-02-27 NOTE — ED Provider Notes (Signed)
[facility] Provider Note    Event Date/Time   First MD Initiated Contact with Patient 02/27/21 1545     (approximate)   History   Fever (Neutropenic)   HPI  Timothy Murray is a 76 y.o. male who presents to the ED for evaluation of Fever (Neutropenic)   I reviewed outpatient oncology visit from this morning.  History of cirrhosis and HCC.  S/p proton beam radiation June 2021 and MDS.  He was seen as an outpatient by oncology this morning and was complaining of urinary frequency, dark-colored urine.  He had a temperature of 100.2 in clinic, apparently looked well, but was sent to the ED for evaluation of possible neutropenic fever.  ANC this morning of 200. Otherwise history of HTN, HLD, GERD, DM, CAD  Patient presents to the ED at the direction of his oncologist.  He reports feeling well right now and has no complaints.  He reports for the past 1 week he has had consistent chills and urinary frequency at nighttime only.  Denies dysuria, hematuria, abdominal pain or emesis.  He did not check his temperature at home as he did not think he had a fever.  He is requesting something to eat and drink as he has not had anything yet today.  Physical Exam   Triage Vital Signs: ED Triage Vitals  Enc Vitals Group     BP 02/27/21 1438 (!) 147/59     Pulse Rate 02/27/21 1438 79     Resp 02/27/21 1438 20     Temp 02/27/21 1438 98.7 F (37.1 C)     Temp Source 02/27/21 1438 Oral     SpO2 02/27/21 1438 98 %     Weight 02/27/21 1438 194 lb (88 kg)     Height 02/27/21 1438 6' (1.829 m)     Head Circumference --      Peak Flow --      Pain Score 02/27/21 1450 0     Pain Loc --      Pain Edu? --      Excl. in [place]? --     Most recent vital signs: Vitals:   02/27/21 1900 02/27/21 1930  BP: (!) 141/59 (!) 133/52  Pulse: 95 75  Resp: (!) 26 19  Temp:    SpO2: 97% 99%    General: Awake, no distress.  Pleasant and conversational in full sentences. CV:  Good  peripheral perfusion.  RRR Resp:  Normal effort.  CTA B Abd:  No distention.  Soft and benign throughout.  No CVA tenderness bilaterally. MSK:  No deformity noted.  Neuro:  No focal deficits appreciated. Other:     ED Results / Procedures / Treatments   Labs (all labs ordered are listed, but only abnormal results are displayed) Labs Reviewed  COMPREHENSIVE METABOLIC PANEL - Abnormal; Notable for the following components:      Result Value   Sodium 134 (*)    Glucose, Bld 180 (*)    Calcium 8.4 (*)    Total Bilirubin 2.0 (*)    All other components within normal limits  LACTIC ACID, PLASMA - Abnormal; Notable for the following components:   Lactic Acid, Venous 2.0 (*)    All other components within normal limits  LACTIC ACID, PLASMA - Abnormal; Notable for the following components:   Lactic Acid, Venous 2.1 (*)    All other components within normal limits  URINALYSIS, ROUTINE W REFLEX MICROSCOPIC - Abnormal; Notable for the following  components:   Color, Urine YELLOW (*)    APPearance CLEAR (*)    All other components within normal limits  CBC WITH DIFFERENTIAL/PLATELET - Abnormal; Notable for the following components:   WBC 0.9 (*)    RBC 2.70 (*)    Hemoglobin 9.5 (*)    HCT 27.0 (*)    MCH 35.2 (*)    Platelets 12 (*)    Neutro Abs 0.2 (*)    Lymphs Abs 0.6 (*)    All other components within normal limits  RESP PANEL BY RT-PCR (FLU A&B, COVID) [id number]  CULTURE, BLOOD (ROUTINE X 2)  CULTURE, BLOOD (ROUTINE X 2)  PROTIME-INR  LACTIC ACID, PLASMA  CBC WITH DIFFERENTIAL/PLATELET    EKG   RADIOLOGY CXR reviewed by me without evidence of acute cardiopulmonary pathology.  Official radiology report(s): DG Chest 2 View  Result Date: 02/27/2021 CLINICAL DATA:  Suspected sepsis. Fever. Liver cancer undergoing chemotherapy. EXAM: CHEST - 2 VIEW COMPARISON:  12/12/2012 FINDINGS: Lungs are adequately inflated without focal airspace consolidation or effusion.  Cardiomediastinal silhouette is normal. Stent graft is present over the distal arch and proximal descending thoracic aorta. Median sternotomy wires are present. Remainder of the exam is unchanged. IMPRESSION: No acute cardiopulmonary disease. Electronically Signed   By: Marin Olp M.D.   On: 02/27/2021 15:49    PROCEDURES and INTERVENTIONS:  .1-3 Lead EKG Interpretation Performed by: Vladimir Crofts, MD Authorized by: Vladimir Crofts, MD     Interpretation: normal     ECG rate:  70   ECG rate assessment: normal     Rhythm: sinus rhythm     Ectopy: none     Conduction: normal    Medications  lactated ringers bolus 1,000 mL (0 mLs Intravenous Stopped 02/27/21 1801)     IMPRESSION / MDM / ASSESSMENT AND PLAN / ED COURSE  I reviewed the triage vital signs and the nursing notes.  76 year old male presents to the ED for evaluation of possible neutropenic fever, without evidence of such and ultimately suitable for outpatient management.  Check his temperature at least 3 times over the course of his 5-hour observation time in the ER.  No fevers.  He looks well and reports no symptoms.  He is requesting discharge and outpatient management.  His work-up shows neutropenia with an [place] of 200, consistent with blood work from earlier today.  He is already on prophylactic Levaquin.  No metabolic derangements, signs of COVID, UTI and his CXR is clear.  I consult with oncology.  I certainly considered medical observation admission for this patient, but he is quite adamant that he wants to go home and is asymptomatic.  We discussed the risks of this and he acknowledges this, still requesting discharge.  We discussed very close return precautions.   Clinical Course as of 02/27/21 1953  Fri Feb 27, 2021  1627 While in the room evaluating the patient, temperature of 98.9. [DS]  1730 I consult with Dr. Janese Banks. Pt is currently on Levaquin. Concern for possible neutropenic fever. Can consider 24hr obs vs close return  precautions [DS]  1802 Reassessed.  Continues to feel well.  Rechecked his temperature, 98.4. [DS]    Clinical Course User Index [DS] Vladimir Crofts, MD     FINAL CLINICAL IMPRESSION(S) / ED DIAGNOSES   Final diagnoses:  Chemotherapy-induced neutropenia ([place])     Rx / DC Orders   ED Discharge Orders     None        Note:  This document was prepared using Dragon voice recognition software and may include unintentional dictation errors.   Vladimir Crofts, MD 02/27/21 [phone]

## 2021-02-27 NOTE — ED Notes (Signed)
Pt given Ice water and a [place] sandwich

## 2021-02-27 NOTE — Discharge Instructions (Signed)
If you develop any fevers, anything 100.4 or above, please return to the ED

## 2021-02-27 NOTE — Progress Notes (Signed)
Symptom Management [place] at [facility] [phone] [phone]  Patient Care Team: Perrin Maltese, MD as PCP - General (Internal Medicine) Dionisio David, MD (Cardiology) Sindy Guadeloupe, MD as Consulting Physician (Hematology and Oncology)   Name of the patient: Timothy Murray  [id number]  September 29, 1945   Date of visit: 02/27/21  Reason for Consult: Timothy Murray is a 76 y.o. male with multiple medical problems including history of cirrhosis and HCC status post proton beam radiation in June 2021.  Patient last saw Dr. Janese Banks on 02/24/2028 at which time he had worsening thrombocytopenia.  Patient is status post 9 cycles of Vidaza, recently started on Promacta for MDS.  Dr. Janese Banks increased the Promacta to 75 mg daily and patient received platelet transfusion with plan to RTC in 1 week for CBC and possible repeat platelet transfusion.  Patient presents to the clinic today for evaluation of dark-colored urine and urinary frequency.  The patient describes about 5 months of noticing his urine is dark during the day but light yellow at night.  He reports that he has had increased urinary frequency, particularly at nighttime over the past several weeks since he started Promacta.  He denies dysuria or urinary urgency.  He denies suprapubic pain.  He does have chronic back pain.  No GI or respiratory symptoms.  Patient has felt cold and was noted to be febrile upon arrival to clinic.  Denies any neurologic complaints. Denies any easy bleeding or bruising. Reports fair appetite. Denies chest pain. Denies any nausea, vomiting, constipation, or diarrhea. Patient offers no further specific complaints today.    PAST MEDICAL HISTORY: Past Medical History:  Diagnosis Date   Arthritis    "lower back" (10/24/2012)   Coronary artery disease    GERD (gastroesophageal reflux disease)    High cholesterol    Hypertension    Liver cancer ([place])    MDS  (myelodysplastic syndrome) ([place])    Type II diabetes mellitus ([place])     PAST SURGICAL HISTORY:  Past Surgical History:  Procedure Laterality Date   APPENDECTOMY  2002   CARDIAC CATHETERIZATION  10/24/2012   CORONARY ARTERY BYPASS GRAFT N/A 10/26/2012   Procedure: CORONARY ARTERY BYPASS GRAFTING (CABG);  Surgeon: Melrose Nakayama, MD;  Location: [place];  Service: Open Heart Surgery;  Laterality: N/A;  Times 3 using left internal mammary artery and endoscopically harvested right saphenous vein   ERCP N/A 04/25/2020   Procedure: ENDOSCOPIC RETROGRADE CHOLANGIOPANCREATOGRAPHY (ERCP);  Surgeon: Lucilla Lame, MD;  Location[facility] ENDOSCOPY;  Service: Endoscopy;  Laterality: N/A;   IR BONE MARROW BIOPSY & ASPIRATION  01/26/2021    HEMATOLOGY/ONCOLOGY HISTORY:  Oncology History  MDS (myelodysplastic syndrome) ([place])  05/26/2020 Initial Diagnosis   MDS (myelodysplastic syndrome) ([place])   05/27/2020 -  Chemotherapy   Patient is on Treatment Plan : MYELODYSPLASIA  Azacitidine SQ D1-7 q28d       ALLERGIES:  is allergic to dome-paste bandage [wound dressings], soap & cleansers, and latex.  MEDICATIONS:  Current Outpatient Medications  Medication Sig Dispense Refill   acyclovir (ZOVIRAX) 400 MG tablet TAKE 1 TABLET BY MOUTH TWICE A DAY 60 tablet 1   atorvastatin (LIPITOR) 40 MG tablet Take by mouth.     Blood Glucose Monitoring Suppl (ONETOUCH VERIO FLEX SYSTEM) w/Device KIT 1 each by Does not apply route in the morning and at bedtime.     eltrombopag (PROMACTA) 75 MG tablet Take 1 tablet (75 mg total) by mouth  daily. Take on an empty stomach 1 hour before meals or 2 hours after. 30 tablet 0   FLUZONE HIGH-DOSE QUADRIVALENT 0.7 ML SUSY Inject 1 each into the skin once.     gabapentin (NEURONTIN) 300 MG capsule Take 1 capsule (300 mg total) by mouth at bedtime. (Patient not taking: Reported on 09/15/2020) 30 capsule 0   glimepiride (AMARYL) 1 MG tablet Take 1 mg by mouth daily.     lactulose  (CHRONULAC) 10 GM/15ML solution Take 15 - 30 ml daily as needed for constipation 300 mL 0   Lancets (ONETOUCH DELICA PLUS [id number]) MISC Apply 1 each topically 2 (two) times daily.     levofloxacin (LEVAQUIN) 500 MG tablet TAKE 1 TABLET (500 MG TOTAL) BY MOUTH DAILY. 30 tablet 0   loratadine (CLARITIN) 10 MG tablet Take 5 mg by mouth daily as needed. Take half a tablet (65m) daily     LORazepam (ATIVAN) 0.5 MG tablet Take 1 tablet (0.5 mg total) by mouth every 6 (six) hours as needed (Nausea or vomiting). (Patient not taking: Reported on 06/05/2020) 30 tablet 0   losartan (COZAAR) 25 MG tablet Take 25 mg by mouth daily. (Patient not taking: Reported on 09/15/2020)     metoprolol tartrate (LOPRESSOR) 25 MG tablet Take 25 mg by mouth daily.     nitroGLYCERIN (NITROSTAT) 0.3 MG SL tablet nitroglycerin 0.3 mg sublingual tablet  PLACE ONE TABLET (0.3 MG DOSE) UNDER THE TONGUE EVERY 5 (FIVE) MINUTES AS NEEDED FOR CHEST PAIN. (Patient not taking: Reported on 06/23/2020)     omeprazole (PRILOSEC) 40 MG capsule TAKE 1 CAPSULE BY MOUTH TWICE A DAY 180 capsule 1   ondansetron (ZOFRAN) 8 MG tablet Take 1 tablet (8 mg total) by mouth 2 (two) times daily as needed (Nausea or vomiting). (Patient not taking: Reported on 06/05/2020) 30 tablet 1   ONETOUCH VERIO test strip 1 each by Other route 2 (two) times daily.     oxyCODONE (OXY IR/ROXICODONE) 5 MG immediate release tablet Take 1 tablet (5 mg total) by mouth every 8 (eight) hours as needed for severe pain. 90 tablet 0   predniSONE (DELTASONE) 20 MG tablet TAKE 1 TABLET BY MOUTH DAILY X 5 DAYS IN AM WITH FOOD (Patient not taking: Reported on 09/15/2020)     prochlorperazine (COMPAZINE) 10 MG tablet Take 1 tablet (10 mg total) by mouth every 6 (six) hours as needed (Nausea or vomiting). (Patient not taking: Reported on 09/15/2020) 30 tablet 1   senna-docusate (SENOKOT-S) 8.6-50 MG tablet Take 1 tablet by mouth every morning.     traZODone (DESYREL) 50 MG tablet TAKE 1 TO  2 TABLETS BY MOUTH AT BEDTIME 180 tablet 1   No current facility-administered medications for this visit.    VITAL SIGNS: There were no vitals taken for this visit. There were no vitals filed for this visit.  Estimated body mass index is 27.46 kg/m as calculated from the following:   Height as of 02/10/21: 6' (1.829 m).   Weight as of 02/23/21: 202 lb 8 oz (91.9 kg).  LABS: CBC:    Component Value Date/Time   WBC 1.3 (LL) 02/23/2021 1000   HGB 10.2 (L) 02/23/2021 1000   HGB 15.6 10/24/2012 1224   HCT 28.2 (L) 02/23/2021 1000   HCT 43.8 10/24/2012 1224   PLT 10 (LL) 02/23/2021 1000   PLT 165 10/24/2012 1224   MCV 100.7 (H) 02/23/2021 1000   MCV 91 10/24/2012 1224   NEUTROABS 0.4 (LL) 02/23/2021 1000  NEUTROABS 4.8 10/24/2012 1224   LYMPHSABS 0.8 02/23/2021 1000   LYMPHSABS 1.8 10/24/2012 1224   MONOABS 0.1 02/23/2021 1000   MONOABS 0.5 10/24/2012 1224   EOSABS 0.0 02/23/2021 1000   EOSABS 0.1 10/24/2012 1224   BASOSABS 0.0 02/23/2021 1000   BASOSABS 0.1 10/24/2012 1224   Comprehensive Metabolic Panel:    Component Value Date/Time   NA 135 02/09/2021 0850   NA 138 10/24/2012 1224   K 4.1 02/09/2021 0850   K 3.9 10/24/2012 1224   CL 100 02/09/2021 0850   CL 106 10/24/2012 1224   CO2 29 02/09/2021 0850   CO2 27 10/24/2012 1224   BUN 16 02/09/2021 0850   BUN 15 10/24/2012 1224   CREATININE 0.99 02/09/2021 0850   CREATININE 1.01 10/24/2012 1224   GLUCOSE 308 (H) 02/09/2021 0850   GLUCOSE 115 (H) 10/24/2012 1224   CALCIUM 8.6 (L) 02/09/2021 0850   CALCIUM 8.9 10/24/2012 1224   AST 28 02/09/2021 0850   AST 29 10/24/2012 1224   ALT 30 02/09/2021 0850   ALT 43 10/24/2012 1224   ALKPHOS 57 02/09/2021 0850   ALKPHOS 60 10/24/2012 1224   BILITOT 1.7 (H) 02/09/2021 0850   BILITOT 0.7 10/24/2012 1224   PROT 6.8 02/09/2021 0850   PROT 7.4 10/24/2012 1224   ALBUMIN 4.4 02/09/2021 0850   ALBUMIN 3.7 10/24/2012 1224    RADIOGRAPHIC STUDIES: No results  found.  PERFORMANCE STATUS (ECOG) : 1 - Symptomatic but completely ambulatory  Review of Systems Unless otherwise noted, a complete review of systems is negative.  Physical Exam General: NAD Cardiovascular: regular rate and rhythm Pulmonary: clear anterior/posterior fields Abdomen: soft, nontender, + bowel sounds GU: no suprapubic tenderness Extremities: no edema, no joint deformities Skin: no rashes Neurological: Weakness but otherwise nonfocal  Assessment and Plan- Patient is a 76 y.o. male with history of cirrhosis and HCC status post proton beam radiation in June 2021 and MDS.  Patient presents to [facility] for evaluation of urinary frequency and dark-colored urine.   Neutropenic fever -temp of 100.2 in clinic.  Patient is nontoxic-appearing.  UA grossly normal.  Patient is already on prophylactic Levaquin.  Discussed with Dr. Janese Banks who recommended sending patient to the ED and hospitalization for further work-up and management.   Patient expressed understanding and was in agreement with this plan. He also understands that He can call clinic at any time with any questions, concerns, or complaints.   Thank you for allowing me to participate in the care of this very pleasant patient.   Time Total: 25 minutes  Visit consisted of counseling and education dealing with the complex and emotionally intense issues of symptom management in the setting of serious illness.Greater than 50%  of this time was spent counseling and coordinating care related to the above assessment and plan.  Signed by: Altha Harm, PhD, NP-C

## 2021-02-28 LAB — URINE CULTURE: Culture: NO GROWTH

## 2021-03-02 ENCOUNTER — Inpatient Hospital Stay: Payer: Medicare HMO

## 2021-03-02 ENCOUNTER — Telehealth: Payer: Self-pay | Admitting: *Deleted

## 2021-03-02 DIAGNOSIS — M5136 Other intervertebral disc degeneration, lumbar region: Secondary | ICD-10-CM | POA: Diagnosis not present

## 2021-03-02 NOTE — Telephone Encounter (Signed)
Patient called asking if it is ok to take steroids per request of his Ortho doctor for his back pain. Please advise

## 2021-03-02 NOTE — Telephone Encounter (Signed)
He should get in touch with them and ok to take steroids for a week or so but not long term

## 2021-03-02 NOTE — Telephone Encounter (Signed)
Patient does not know how long he will be on steroids I asked where he is being seen and he said he saw the PA Danae Orleans at [facility]

## 2021-03-02 NOTE — Telephone Encounter (Signed)
How long has he been given steroids? I worry about immunosuppression with prolonged use and prefer to avoid it

## 2021-03-03 ENCOUNTER — Encounter: Payer: Self-pay | Admitting: Oncology

## 2021-03-03 ENCOUNTER — Other Ambulatory Visit: Payer: Self-pay | Admitting: *Deleted

## 2021-03-03 DIAGNOSIS — K219 Gastro-esophageal reflux disease without esophagitis: Secondary | ICD-10-CM | POA: Diagnosis not present

## 2021-03-03 DIAGNOSIS — D469 Myelodysplastic syndrome, unspecified: Secondary | ICD-10-CM

## 2021-03-03 DIAGNOSIS — I10 Essential (primary) hypertension: Secondary | ICD-10-CM | POA: Diagnosis not present

## 2021-03-03 DIAGNOSIS — D696 Thrombocytopenia, unspecified: Secondary | ICD-10-CM

## 2021-03-03 DIAGNOSIS — E785 Hyperlipidemia, unspecified: Secondary | ICD-10-CM | POA: Diagnosis not present

## 2021-03-03 DIAGNOSIS — D649 Anemia, unspecified: Secondary | ICD-10-CM

## 2021-03-03 NOTE — Telephone Encounter (Signed)
Call returned to patient and informed of doctor response and he repeated back to me. He will contact [facility] for prescription

## 2021-03-04 ENCOUNTER — Other Ambulatory Visit: Payer: Self-pay

## 2021-03-04 ENCOUNTER — Telehealth: Payer: Self-pay | Admitting: *Deleted

## 2021-03-04 DIAGNOSIS — D469 Myelodysplastic syndrome, unspecified: Secondary | ICD-10-CM

## 2021-03-04 LAB — CULTURE, BLOOD (ROUTINE X 2)
Culture: NO GROWTH
Culture: NO GROWTH

## 2021-03-04 NOTE — Telephone Encounter (Signed)
Patient called left message asking that we send "authorization " to [facility] for him to get a wheelchair ?

## 2021-03-04 NOTE — Telephone Encounter (Signed)
Pt called back and I told him that Timothy Murray put the order in and [employer] is usually the company to provide wheel chair. I typically takes 3-5 days and [employer] goes through his insurance to get it. ?

## 2021-03-04 NOTE — Telephone Encounter (Signed)
Called pt and LVM and sent mychart message letting him know order has been made for the wheelchair. ?

## 2021-03-05 ENCOUNTER — Telehealth: Payer: Self-pay | Admitting: *Deleted

## 2021-03-05 NOTE — Telephone Encounter (Signed)
Patient called back and said that Marcus called him and said for [place] to fax an order to [employer] [phone] for a wheelchair. The local number for [employer] is [phone]. ? ?He also wanted me to let Dr Janese Banks know that he has cancelled his appointment with Dr Marvel Plan due to the condition of his legs being weak and shaky ?

## 2021-03-06 ENCOUNTER — Telehealth: Payer: Self-pay | Admitting: *Deleted

## 2021-03-06 ENCOUNTER — Other Ambulatory Visit: Payer: Self-pay | Admitting: Oncology

## 2021-03-06 ENCOUNTER — Other Ambulatory Visit: Payer: Self-pay | Admitting: *Deleted

## 2021-03-06 MED ORDER — GABAPENTIN 300 MG PO CAPS: 300.0000 mg | ORAL_CAPSULE | Freq: Every day | ORAL | 1 refills | Status: DC

## 2021-03-06 MED ORDER — OXYCODONE HCL 5 MG PO TABS: 5.0000 mg | ORAL_TABLET | ORAL | 0 refills | Status: DC | PRN

## 2021-03-06 NOTE — Telephone Encounter (Signed)
Patient called asking if he can take his Oxycodone every 4 hours instead of every 8 hours due to severe pain in low back and knee. Cannot stand up He mentioned something about an MRI being mentioned by Ortho stating he needs one. Please advise ?

## 2021-03-06 NOTE — Telephone Encounter (Signed)
Ok to take oxycodone q4 hours prn. Is he getting mri spine or would he like [place] to order?

## 2021-03-06 NOTE — Telephone Encounter (Signed)
Patient stats he doe not feel he can tolerate the MRI right now and wants to do therapy first and will let us know after that regarding the MRI. Informed that per Dr Janese Banks, he can take his Oxycodone every 4 hours as needed  ?

## 2021-03-09 ENCOUNTER — Inpatient Hospital Stay: Payer: Medicare HMO | Attending: Nurse Practitioner

## 2021-03-09 ENCOUNTER — Inpatient Hospital Stay (HOSPITAL_BASED_OUTPATIENT_CLINIC_OR_DEPARTMENT_OTHER): Payer: Medicare HMO | Admitting: Oncology

## 2021-03-09 ENCOUNTER — Other Ambulatory Visit (HOSPITAL_COMMUNITY): Payer: Self-pay

## 2021-03-09 ENCOUNTER — Inpatient Hospital Stay: Payer: Medicare HMO

## 2021-03-09 ENCOUNTER — Encounter: Payer: Self-pay | Admitting: Oncology

## 2021-03-09 ENCOUNTER — Other Ambulatory Visit: Payer: Self-pay

## 2021-03-09 VITALS — BP 135/54 | HR 81 | Temp 97.2°F | Resp 20 | Wt 192.0 lb

## 2021-03-09 DIAGNOSIS — K7460 Unspecified cirrhosis of liver: Secondary | ICD-10-CM | POA: Diagnosis not present

## 2021-03-09 DIAGNOSIS — D61818 Other pancytopenia: Secondary | ICD-10-CM | POA: Insufficient documentation

## 2021-03-09 DIAGNOSIS — Z79899 Other long term (current) drug therapy: Secondary | ICD-10-CM | POA: Diagnosis not present

## 2021-03-09 DIAGNOSIS — D6959 Other secondary thrombocytopenia: Secondary | ICD-10-CM | POA: Insufficient documentation

## 2021-03-09 DIAGNOSIS — D696 Thrombocytopenia, unspecified: Secondary | ICD-10-CM

## 2021-03-09 DIAGNOSIS — M545 Low back pain, unspecified: Secondary | ICD-10-CM

## 2021-03-09 DIAGNOSIS — D469 Myelodysplastic syndrome, unspecified: Secondary | ICD-10-CM

## 2021-03-09 DIAGNOSIS — Z923 Personal history of irradiation: Secondary | ICD-10-CM | POA: Diagnosis not present

## 2021-03-09 DIAGNOSIS — M549 Dorsalgia, unspecified: Secondary | ICD-10-CM | POA: Diagnosis not present

## 2021-03-09 DIAGNOSIS — D46C Myelodysplastic syndrome with isolated del(5q) chromosomal abnormality: Secondary | ICD-10-CM | POA: Diagnosis not present

## 2021-03-09 DIAGNOSIS — D649 Anemia, unspecified: Secondary | ICD-10-CM

## 2021-03-09 DIAGNOSIS — Z8505 Personal history of malignant neoplasm of liver: Secondary | ICD-10-CM | POA: Insufficient documentation

## 2021-03-09 DIAGNOSIS — D701 Agranulocytosis secondary to cancer chemotherapy: Secondary | ICD-10-CM

## 2021-03-09 DIAGNOSIS — Z87891 Personal history of nicotine dependence: Secondary | ICD-10-CM | POA: Diagnosis not present

## 2021-03-09 LAB — CBC WITH DIFFERENTIAL/PLATELET
Abs Immature Granulocytes: 0.02 10*3/uL (ref 0.00–0.07)
Basophils Absolute: 0 10*3/uL (ref 0.0–0.1)
Basophils Relative: 0 %
Eosinophils Absolute: 0 10*3/uL (ref 0.0–0.5)
Eosinophils Relative: 0 %
HCT: 24.5 % — ABNORMAL LOW (ref 39.0–52.0)
Hemoglobin: 8.6 g/dL — ABNORMAL LOW (ref 13.0–17.0)
Immature Granulocytes: 2 %
Lymphocytes Relative: 41 %
Lymphs Abs: 0.4 10*3/uL — ABNORMAL LOW (ref 0.7–4.0)
MCH: 34.8 pg — ABNORMAL HIGH (ref 26.0–34.0)
MCHC: 35.1 g/dL (ref 30.0–36.0)
MCV: 99.2 fL (ref 80.0–100.0)
Monocytes Absolute: 0.2 10*3/uL (ref 0.1–1.0)
Monocytes Relative: 19 %
Neutro Abs: 0.3 10*3/uL — CL (ref 1.7–7.7)
Neutrophils Relative %: 38 %
Platelets: 22 10*3/uL — CL (ref 150–400)
RBC: 2.47 MIL/uL — ABNORMAL LOW (ref 4.22–5.81)
RDW: 15 % (ref 11.5–15.5)
Smear Review: NORMAL
WBC: 0.9 10*3/uL — CL (ref 4.0–10.5)
nRBC: 0 % (ref 0.0–0.2)

## 2021-03-09 LAB — COMPREHENSIVE METABOLIC PANEL
ALT: 24 U/L (ref 0–44)
AST: 22 U/L (ref 15–41)
Albumin: 3.5 g/dL (ref 3.5–5.0)
Alkaline Phosphatase: 58 U/L (ref 38–126)
Anion gap: 10 (ref 5–15)
BUN: 23 mg/dL (ref 8–23)
CO2: 25 mmol/L (ref 22–32)
Calcium: 8.4 mg/dL — ABNORMAL LOW (ref 8.9–10.3)
Chloride: 98 mmol/L (ref 98–111)
Creatinine, Ser: 1.17 mg/dL (ref 0.61–1.24)
GFR, Estimated: >60 mL/min (ref >60)
Glucose, Bld: 307 mg/dL — ABNORMAL HIGH (ref 70–99)
Potassium: 4.1 mmol/L (ref 3.5–5.1)
Sodium: 133 mmol/L — ABNORMAL LOW (ref 135–145)
Total Bilirubin: 0.6 mg/dL (ref 0.3–1.2)
Total Protein: 6.4 g/dL — ABNORMAL LOW (ref 6.5–8.1)

## 2021-03-09 LAB — SAMPLE TO BLOOD BANK

## 2021-03-09 MED ORDER — ELTROMBOPAG OLAMINE 50 MG PO TABS
100.0000 mg | ORAL_TABLET | Freq: Every day | ORAL | 0 refills | Status: DC
  Filled 2021-03-09: qty 60, 30d supply, fill #0

## 2021-03-09 NOTE — Progress Notes (Signed)
Pt c/o back pain 10/10 states it started after his ER visit. Takes tylenol every 3 hours in order to avoid the pain. Will like an MRI done and is requesting a wheelchair for home use, getting more difficult to move around at home.  ?

## 2021-03-09 NOTE — Progress Notes (Signed)
Hematology/Oncology Consult note [facility]  [phone] [phone]  Patient Care Team: Perrin Maltese, MD as PCP - General (Internal Medicine) Dionisio David, MD (Cardiology) Sindy Guadeloupe, MD as Consulting Physician (Hematology and Oncology)   Name of the patient: Timothy Murray  [id number]  1945-11-10   Date of visit: 03/09/21  Diagnosis- MDS with 5q del but high risk given TP53 mutation 2. Arial s/p thermal ablation  Chief complaint/ Reason for visit-on treatment assessment prior to next cycle of Vidaza  Heme/Onc history: Patient is a 76 year old male with a history of cirrhosis and hepatocellular carcinoma for which she had proton beam radiation In June 2021.  He has baseline thrombocytopenia which was initially attributed to his cirrhosis.  He was found to have progressive pancytopenia with a steady decline in his white cell count since March 2022 when it was down to 3.4 with a platelet count of 53.  He was hospitalized sometime in April 2022 with acute biliary gallstone pancreatitis .  He underwent ERCP inpatient with platelet transfusion which showed sludge and biliary sphincterotomy was performed.  His bilirubin which was elevated up to 7.2 has come down to 2.4 presently.   However his pancytopenia has continued to worsen and presently white count is 1.1 with an H&H of 6.2/17.6 with an MCV of 102 and a platelet count of 33.Bone marrow biopsy showed evidence of dyspoietic changes in the erythroid and megakaryocyte excel lines associated with ring sideroblasts.  Definite increase in blastic cells not identified.  Overall features favor myelodysplastic syndrome.  Karyotype was normal but only for metaphase cells were analyzed which were not sufficient.  [facility] FISH MDS panel showed deletion of 5 q. along with monosomy 7 and deletion 7 q. without excess blasts.  SF3B1 mutation negative.   Peripheral blood intellegin myeloid panel  showed :   Detected     Change       Frequency (%) Impact  TP53   c.716A>C     p.Asn239Thr  19            Tier I  U2AF1  c.470A>C     p.Gln157Pro  17            Tier I  IDH1   c.315C>T     p.Gly105Gly  53            Tier II      With regards to his hepatocellular carcinoma patient had an MRI abdomen on 04/24/2020 which showed that his primary liver mass which was previously 4.3 x 3.6 cmWas similar in size at 4.5 x 3.5 cm.    Patient was briefly on Revlimid for 5 q. deletion but after T p53 came back he was switched to [place] for 5 days every 28 days starting 05/27/2020    Patient was seen by Dr. Marvel Plan at [facility] for second opinion and he also had a repeat bone marrow biopsyDone on 08/14/2020.  Biopsy showed normocellular bone marrow with erythroid predominant trilineage hematopoiesis.  Megakaryocytes were typical in number with few hypolobated forms.  Full sequence of orderly maturation noted in both granulopoiesis and erythropoiesis.  3% blasts were seen by manual differential.   Plan is to continue Vidaza until progression or toxicity which was started in May 2022.  Promacta started in February 2023 for worsening thrombocytopenia  Interval history-patient was sent to the ER on 02/27/2021 for low-grade fever.  He was eventually not admitted on that day but since then  he has been having low back pain which is making it difficult for him to ambulate.  Reports that his low back pain radiates to his bilateral legs.  He is currently taking as needed oxycodone as well as Tylenol for the same but states that it does not take away the pain completely.  Denies any bowel bladder incontinence.  Denies any changes in his sensation in bilateral lower extremities.  Tolerating Promacta well without any significant side effects  ECOG PS- 2 Pain scale- 6 Opioid associated constipation- no  Review of systems- Review of Systems  Constitutional:  Positive for malaise/fatigue. Negative for chills, fever and weight  loss.  HENT:  Negative for congestion, ear discharge and nosebleeds.   Eyes:  Negative for blurred vision.  Respiratory:  Negative for cough, hemoptysis, sputum production, shortness of breath and wheezing.   Cardiovascular:  Negative for chest pain, palpitations, orthopnea and claudication.  Gastrointestinal:  Negative for abdominal pain, blood in stool, constipation, diarrhea, heartburn, melena, nausea and vomiting.  Genitourinary:  Negative for dysuria, flank pain, frequency, hematuria and urgency.  Musculoskeletal:  Positive for back pain. Negative for joint pain and myalgias.  Skin:  Negative for rash.  Neurological:  Negative for dizziness, tingling, focal weakness, seizures, weakness and headaches.  Endo/Heme/Allergies:  Does not bruise/bleed easily.  Psychiatric/Behavioral:  Negative for depression and suicidal ideas. The patient does not have insomnia.       Allergies  Allergen Reactions   Dome-Paste Bandage [Wound Dressings]     All bandaids cause a rash   Soap & Cleansers     "liquid soap" antibacterial -rash   Latex Rash and Itching     Past Medical History:  Diagnosis Date   Arthritis    "lower back" (10/24/2012)   Coronary artery disease    GERD (gastroesophageal reflux disease)    High cholesterol    Hypertension    Liver cancer ([place])    MDS (myelodysplastic syndrome) ([place])    Type II diabetes mellitus ([place])      Past Surgical History:  Procedure Laterality Date   APPENDECTOMY  2002   CARDIAC CATHETERIZATION  10/24/2012   CORONARY ARTERY BYPASS GRAFT N/A 10/26/2012   Procedure: CORONARY ARTERY BYPASS GRAFTING (CABG);  Surgeon: Melrose Nakayama, MD;  Location: [facility];  Service: Open Heart Surgery;  Laterality: N/A;  Times 3 using left internal mammary artery and endoscopically harvested right saphenous vein   ERCP N/A 04/25/2020   Procedure: ENDOSCOPIC RETROGRADE CHOLANGIOPANCREATOGRAPHY (ERCP);  Surgeon: Lucilla Lame, MD;  Location: [facility];   Service: Endoscopy;  Laterality: N/A;   IR BONE MARROW BIOPSY & ASPIRATION  01/26/2021    Social History   Socioeconomic History   Marital status: Married    Spouse name: Not on file   Number of children: Not on file   Years of education: Not on file   Highest education level: Not on file  Occupational History   Not on file  Tobacco Use   Smoking status: Former    Packs/day: 0.50    Years: 30.00    Pack years: 15.00    Types: Cigarettes    Quit date: 08/28/2001    Years since quitting: 19.5   Smokeless tobacco: Never  Vaping Use   Vaping Use: Never used  Substance and Sexual Activity   Alcohol use: No    Alcohol/week: 0.0 standard drinks    Comment: 10/24/2012 "stopped drinking alcohol in 2003; used to drink ~ 9 bottles beer/wk"   Drug  use: No   Sexual activity: Yes  Other Topics Concern   Not on file  Social History Narrative   Not on file   Social Determinants of Health   Financial Resource Strain: Not on file  Food Insecurity: Not on file  Transportation Needs: Not on file  Physical Activity: Not on file  Stress: Not on file  Social Connections: Not on file  Intimate Partner Violence: Not on file    Family History  Problem Relation Age of Onset   Cancer Sister      Current Outpatient Medications:    eltrombopag (PROMACTA) 50 MG tablet, Take 2 tablets (100 mg total) by mouth daily. Take on an empty stomach 1 hour before a meal or 2 hours after, Disp: 60 tablet, Rfl: 0   acyclovir (ZOVIRAX) 400 MG tablet, TAKE 1 TABLET BY MOUTH TWICE A DAY, Disp: 60 tablet, Rfl: 1   allopurinol (ZYLOPRIM) 300 MG tablet, TAKE 1 TABLET BY MOUTH EVERY DAY, Disp: 90 tablet, Rfl: 1   atorvastatin (LIPITOR) 40 MG tablet, Take by mouth., Disp: , Rfl:    Blood Glucose Monitoring Suppl (ONETOUCH VERIO FLEX SYSTEM) w/Device KIT, 1 each by Does not apply route in the morning and at bedtime., Disp: , Rfl:    FLUZONE HIGH-DOSE QUADRIVALENT 0.7 ML SUSY, Inject 1 each into the skin once.,  Disp: , Rfl:    gabapentin (NEURONTIN) 300 MG capsule, Take 1 capsule (300 mg total) by mouth at bedtime., Disp: 90 capsule, Rfl: 1   glimepiride (AMARYL) 1 MG tablet, Take 1 mg by mouth daily., Disp: , Rfl:    lactulose (CHRONULAC) 10 GM/15ML solution, Take 15 - 30 ml daily as needed for constipation, Disp: 300 mL, Rfl: 0   Lancets (ONETOUCH DELICA PLUS FOYDXA12I) MISC, Apply 1 each topically 2 (two) times daily., Disp: , Rfl:    levofloxacin (LEVAQUIN) 500 MG tablet, TAKE 1 TABLET (500 MG TOTAL) BY MOUTH DAILY., Disp: 30 tablet, Rfl: 0   loratadine (CLARITIN) 10 MG tablet, Take 5 mg by mouth daily as needed. Take half a tablet (32m) daily, Disp: , Rfl:    LORazepam (ATIVAN) 0.5 MG tablet, Take 1 tablet (0.5 mg total) by mouth every 6 (six) hours as needed (Nausea or vomiting). (Patient not taking: Reported on 06/05/2020), Disp: 30 tablet, Rfl: 0   losartan (COZAAR) 25 MG tablet, Take 25 mg by mouth daily. (Patient not taking: Reported on 09/15/2020), Disp: , Rfl:    metoprolol tartrate (LOPRESSOR) 25 MG tablet, Take 25 mg by mouth daily., Disp: , Rfl:    nitroGLYCERIN (NITROSTAT) 0.3 MG SL tablet, nitroglycerin 0.3 mg sublingual tablet  PLACE ONE TABLET (0.3 MG DOSE) UNDER THE TONGUE EVERY 5 (FIVE) MINUTES AS NEEDED FOR CHEST PAIN. (Patient not taking: Reported on 06/23/2020), Disp: , Rfl:    omeprazole (PRILOSEC) 40 MG capsule, TAKE 1 CAPSULE BY MOUTH TWICE A DAY, Disp: 180 capsule, Rfl: 1   ondansetron (ZOFRAN) 8 MG tablet, Take 1 tablet (8 mg total) by mouth 2 (two) times daily as needed (Nausea or vomiting). (Patient not taking: Reported on 06/05/2020), Disp: 30 tablet, Rfl: 1   ONETOUCH VERIO test strip, 1 each by Other route 2 (two) times daily., Disp: , Rfl:    oxyCODONE (OXY IR/ROXICODONE) 5 MG immediate release tablet, Take 1 tablet (5 mg total) by mouth every 4 (four) hours as needed for severe pain., Disp: 90 tablet, Rfl: 0   predniSONE (DELTASONE) 20 MG tablet, TAKE 1 TABLET BY MOUTH DAILY  X 5  DAYS IN AM WITH FOOD (Patient not taking: Reported on 09/15/2020), Disp: , Rfl:    prochlorperazine (COMPAZINE) 10 MG tablet, Take 1 tablet (10 mg total) by mouth every 6 (six) hours as needed (Nausea or vomiting). (Patient not taking: Reported on 09/15/2020), Disp: 30 tablet, Rfl: 1   senna-docusate (SENOKOT-S) 8.6-50 MG tablet, Take 1 tablet by mouth every morning., Disp: , Rfl:    traZODone (DESYREL) 50 MG tablet, TAKE 1 TO 2 TABLETS BY MOUTH AT BEDTIME, Disp: 180 tablet, Rfl: 1  Physical exam:  Vitals:   03/09/21 0833  BP: (!) 135/54  Pulse: 81  Resp: 20  Temp: (!) 97.2 F (36.2 C)  SpO2: 96%  Weight: 192 lb (87.1 kg)   Physical Exam Constitutional:      General: He is not in acute distress. HENT:     Head: Normocephalic.  Cardiovascular:     Rate and Rhythm: Normal rate and regular rhythm.     Heart sounds: Normal heart sounds.  Pulmonary:     Effort: Pulmonary effort is normal.     Breath sounds: Normal breath sounds.  Musculoskeletal:     Cervical back: Normal range of motion.  Skin:    General: Skin is warm and dry.  Neurological:     General: No focal deficit present.     Mental Status: He is alert and oriented to person, place, and time.     CMP Latest Ref Rng & Units 03/09/2021  Glucose 70 - 99 mg/dL 307(H)  BUN 8 - 23 mg/dL 23  Creatinine 0.61 - 1.24 mg/dL 1.17  Sodium 135 - 145 mmol/L 133(L)  Potassium 3.5 - 5.1 mmol/L 4.1  Chloride 98 - 111 mmol/L 98  CO2 22 - 32 mmol/L 25  Calcium 8.9 - 10.3 mg/dL 8.4(L)  Total Protein 6.5 - 8.1 g/dL 6.4(L)  Total Bilirubin 0.3 - 1.2 mg/dL 0.6  Alkaline Phos 38 - 126 U/L 58  AST 15 - 41 U/L 22  ALT 0 - 44 U/L 24   CBC Latest Ref Rng & Units 03/09/2021  WBC 4.0 - 10.5 K/uL 0.9(LL)  Hemoglobin 13.0 - 17.0 g/dL 8.6(L)  Hematocrit 39.0 - 52.0 % 24.5(L)  Platelets 150 - 400 K/uL 22(LL)    No images are attached to the encounter.  DG Chest 2 View  Result Date: 02/27/2021 CLINICAL DATA:  Suspected sepsis. Fever. Liver  cancer undergoing chemotherapy. EXAM: CHEST - 2 VIEW COMPARISON:  12/12/2012 FINDINGS: Lungs are adequately inflated without focal airspace consolidation or effusion. Cardiomediastinal silhouette is normal. Stent graft is present over the distal arch and proximal descending thoracic aorta. Median sternotomy wires are present. Remainder of the exam is unchanged. IMPRESSION: No acute cardiopulmonary disease. Electronically Signed   By: Marin Olp M.D.   On: 02/27/2021 15:49     Assessment and plan- Patient is a 76 y.o. male with high risk MDS here for on treatment assessment prior to cycle10 of Vidaza  Patient last received Vidaza 4 weeks ago.  His counts are still not recovered with a white cell count of 0.9 today with an ANC of 0.3.  Hemoglobin which was 13.7 the day he received his last 5 days is down to 8.6 today.  Given that his [place] is less than 1 I will hold off on giving him Vidaza at this time and continue checking weekly CBC with differential.  Patient's predominant cytopenia necessitating modification of treatment has been his thrombocytopenia.  Therefore he has been getting  Vidaza along with Promacta.  He is presently on Promacta 75 mg daily.  Platelet counts are mildly better at 22 was compared to 12 before.  He does not require platelet transfusion today.  However I will increase his dose of Promacta to 100 mg daily.  Patient was supposed to follow-up with Dr. Marvel Plan at [facility] but could not go there because of his low back pain.  Based on his response to counts, I will reach out to Dr. Marvel Plan in the near future  CBC with differential, hold tube for possible transfusion in 1 week in 2 weeks and I will see him back in 2 weeks for possible Vidaza  Low back pain: Etiology unclear.  No clinical signs of cord compression which would not be seen typically with MDS but can be rarely seen in cases of acute leukemia.I will proceed with an MRI of the thoracic and lumbar spine at this time.  Have  also given him Oxycodone 5 mg every 4 hours as needed for wrist pain and we will increase the dose to 10 mg every 4 hours as needed.  We will be refilling his prescription tomorrow     Visit Diagnosis 1. Back pain, unspecified back location, unspecified back pain laterality, unspecified chronicity   2. MDS (myelodysplastic syndrome) ([place])      Dr. Randa Evens, MD, MPH [facility] at [facility] [phone] 03/09/2021 12:16 PM

## 2021-03-09 NOTE — Telephone Encounter (Signed)
;  Pt has a appt mri of lumbar on wed. He also has appt with [facility] same day at 9:15 or 9:30. I told him that the results would not be back that quickly but with his pain he will see them because he already has an appt ?

## 2021-03-10 ENCOUNTER — Inpatient Hospital Stay: Payer: Medicare HMO

## 2021-03-10 ENCOUNTER — Encounter: Payer: Self-pay | Admitting: Oncology

## 2021-03-11 ENCOUNTER — Inpatient Hospital Stay: Payer: Medicare HMO

## 2021-03-11 ENCOUNTER — Other Ambulatory Visit: Payer: Self-pay

## 2021-03-11 ENCOUNTER — Ambulatory Visit
Admission: RE | Admit: 2021-03-11 | Discharge: 2021-03-11 | Disposition: A | Discharge status: Home / Self Care | Payer: Medicare HMO | Source: Ambulatory Visit | Attending: Oncology | Admitting: Oncology

## 2021-03-11 ENCOUNTER — Telehealth: Payer: Self-pay | Admitting: *Deleted

## 2021-03-11 DIAGNOSIS — M5127 Other intervertebral disc displacement, lumbosacral region: Secondary | ICD-10-CM | POA: Diagnosis not present

## 2021-03-11 DIAGNOSIS — D469 Myelodysplastic syndrome, unspecified: Secondary | ICD-10-CM

## 2021-03-11 DIAGNOSIS — M549 Dorsalgia, unspecified: Secondary | ICD-10-CM | POA: Diagnosis not present

## 2021-03-11 DIAGNOSIS — M47816 Spondylosis without myelopathy or radiculopathy, lumbar region: Secondary | ICD-10-CM | POA: Diagnosis not present

## 2021-03-11 DIAGNOSIS — R531 Weakness: Secondary | ICD-10-CM

## 2021-03-11 DIAGNOSIS — Z993 Dependence on wheelchair: Secondary | ICD-10-CM

## 2021-03-11 MED ORDER — GADOBUTROL 1 MMOL/ML IV SOLN
7.5000 mL | Freq: Once | INTRAVENOUS | Status: AC | PRN
  Administered 2021-03-11: 7.5 mL via INTRAVENOUS

## 2021-03-11 NOTE — Telephone Encounter (Signed)
I contacted the patient and updated him on the status of the w/c order. He stated that he felt that "[facility] didn't care about his need" and this is why he told them to cnl the order for the w/c. He stated that he would just ask "[facility] to write a new order for a w/c at the next visit." I explained to the patient that even if [facility] write a new order for a w/c, his insurance would still require [employer] to supply the order for the w/c. I also explained that [facility] is just following his insurance guidelines, which requires a PT assessment prior to the dispensing of a w/c. He stated that he didn't understand this until I just explained the process now. He agreed with PT out patient assessment, but urged the necessity of obtaining this apt asap. He states that he experiences a lot of back pain. The pain contributes to his weakness and he has to sit down frequently. ? ?Discussed with patient that he could always have his family search for a standard w/c and pay out of pocket (if not too expensive this may be a good option to get a w/c). Pt states that if his insurance pays 80% and he is only responsible for 20% of the cost.  ?Pt provided with community resources such as hospice flea market and other local stores for w/c. I explained to him that the used w/c may also put him at risk for pressure ulcers if not sized correctly to his needs. I explained to him that the PT assessment would provide accurate measurements so that he can get the right sized w/c. ? ?He stated he understood. His wife will look around to see what he can find. In the meantime, pt ok with PT referral for w/c assessment. He again stressed the urgency on moving as quickly as possible with this process. ? ? ?

## 2021-03-11 NOTE — Telephone Encounter (Signed)
Per Judeen Hammans and Dr. Janese Banks, patient has not been able to get his personalized w/c with cushion and seat. Order faxed on 3/3 to [employer] per [facility] I called [employer] to inquire about the updates on the order for the w/c. Per Raquel Sarna at [facility] [phone]), equipment agency has spoke to patient multiple times about this request for a w/c. Pt called [employer] and personally cnl the DME order b/c [employer] was 'taking too long with the DME w/c request.' Pt was informed several times that [employer] is the preferred agency to dispense his w/c equipment; however, his insurance requires PT assessment. This process could take up to 90 days before insurance will pay for his w/c." ? ?Dr. Janese Banks and nursing team made aware of the update. Dr. Janese Banks suggested a referral to [facility] in OT screen to further evaluate the patient's weakness. Next available apt with [facility] is not until 3/22. I corresponded with [facility] in OT screen as well as Blanche East in PT.  ? ?Per Margaret,'s recommendations-  ? ?"referral from the physician for a wheelchair evaluation. Once PT does the evaluation we will send our recommendations to [employer] and that will get sent back to the physician to sign off on. It is a lot of paperwork which is likely why it takes insurance so long to approve. Typically insurance will pay for a new wheelchair once every 5 years. We will have to do the wheelchair assessment for a manual or power chair and/or scooter depending on what the patient needs. [facility] does a lot of wheelchair evaluations with [employer] so it shouldn't be an issue. If they just want a standard wheelchair they can go to any medical supply company and get one but they would likely pay out of pocket. Some patients can also find some at [facility] if they want one at a discount. However, I would not recommend that they get one without getting fitted as a poor fitted wheelchair can cause skin breakdown and pressure sores." ?

## 2021-03-12 ENCOUNTER — Inpatient Hospital Stay: Payer: Medicare HMO

## 2021-03-12 ENCOUNTER — Telehealth: Payer: Self-pay | Admitting: *Deleted

## 2021-03-12 NOTE — Telephone Encounter (Addendum)
Patient called asking for CT results to be discussed with him ? ?IMPRESSION: ?Multilevel degenerative changes as detailed above without high-grade ?stenosis. ?  ?Decreased T1 marrow signal probably reflects suspected ?myelodysplastic syndrome. ?  ?  ?Electronically Signed ?  By: Macy Mis M.D. ?  On: 03/11/2021 08:40 ?Pt did not have any fractures but had bulging discs and should see the [facility] and had appt today. I left message at home with this info ?

## 2021-03-13 ENCOUNTER — Other Ambulatory Visit: Payer: Self-pay

## 2021-03-13 ENCOUNTER — Other Ambulatory Visit (HOSPITAL_COMMUNITY): Payer: Self-pay

## 2021-03-13 ENCOUNTER — Encounter: Payer: Self-pay | Admitting: Oncology

## 2021-03-13 ENCOUNTER — Inpatient Hospital Stay (HOSPITAL_COMMUNITY)
Admission: EM | Admit: 2021-03-13 | Discharge: 2021-03-17 | DRG: 291 | Disposition: A | Discharge status: Home Health | Payer: Medicare HMO | Attending: Internal Medicine | Admitting: Internal Medicine

## 2021-03-13 ENCOUNTER — Emergency Department (HOSPITAL_COMMUNITY): Payer: Medicare HMO

## 2021-03-13 ENCOUNTER — Encounter (HOSPITAL_COMMUNITY): Payer: Self-pay | Admitting: Emergency Medicine

## 2021-03-13 ENCOUNTER — Ambulatory Visit: Payer: Medicare HMO

## 2021-03-13 ENCOUNTER — Telehealth: Payer: Self-pay | Admitting: *Deleted

## 2021-03-13 DIAGNOSIS — E1169 Type 2 diabetes mellitus with other specified complication: Secondary | ICD-10-CM | POA: Diagnosis not present

## 2021-03-13 DIAGNOSIS — I5033 Acute on chronic diastolic (congestive) heart failure: Secondary | ICD-10-CM | POA: Diagnosis not present

## 2021-03-13 DIAGNOSIS — K219 Gastro-esophageal reflux disease without esophagitis: Secondary | ICD-10-CM | POA: Diagnosis present

## 2021-03-13 DIAGNOSIS — D61818 Other pancytopenia: Secondary | ICD-10-CM | POA: Diagnosis present

## 2021-03-13 DIAGNOSIS — I509 Heart failure, unspecified: Secondary | ICD-10-CM

## 2021-03-13 DIAGNOSIS — Z8505 Personal history of malignant neoplasm of liver: Secondary | ICD-10-CM | POA: Diagnosis not present

## 2021-03-13 DIAGNOSIS — Z91040 Latex allergy status: Secondary | ICD-10-CM | POA: Diagnosis not present

## 2021-03-13 DIAGNOSIS — R609 Edema, unspecified: Secondary | ICD-10-CM

## 2021-03-13 DIAGNOSIS — N179 Acute kidney failure, unspecified: Secondary | ICD-10-CM | POA: Diagnosis present

## 2021-03-13 DIAGNOSIS — E785 Hyperlipidemia, unspecified: Secondary | ICD-10-CM | POA: Diagnosis present

## 2021-03-13 DIAGNOSIS — M545 Low back pain, unspecified: Principal | ICD-10-CM

## 2021-03-13 DIAGNOSIS — I5083 High output heart failure: Secondary | ICD-10-CM | POA: Diagnosis present

## 2021-03-13 DIAGNOSIS — Z87891 Personal history of nicotine dependence: Secondary | ICD-10-CM

## 2021-03-13 DIAGNOSIS — C220 Liver cell carcinoma: Secondary | ICD-10-CM | POA: Diagnosis not present

## 2021-03-13 DIAGNOSIS — Z20822 Contact with and (suspected) exposure to covid-19: Secondary | ICD-10-CM | POA: Diagnosis present

## 2021-03-13 DIAGNOSIS — Z91048 Other nonmedicinal substance allergy status: Secondary | ICD-10-CM

## 2021-03-13 DIAGNOSIS — E876 Hypokalemia: Secondary | ICD-10-CM | POA: Diagnosis not present

## 2021-03-13 DIAGNOSIS — D649 Anemia, unspecified: Secondary | ICD-10-CM

## 2021-03-13 DIAGNOSIS — E1165 Type 2 diabetes mellitus with hyperglycemia: Secondary | ICD-10-CM | POA: Diagnosis present

## 2021-03-13 DIAGNOSIS — I110 Hypertensive heart disease with heart failure: Secondary | ICD-10-CM | POA: Diagnosis not present

## 2021-03-13 DIAGNOSIS — E7800 Pure hypercholesterolemia, unspecified: Secondary | ICD-10-CM | POA: Diagnosis present

## 2021-03-13 DIAGNOSIS — K7460 Unspecified cirrhosis of liver: Secondary | ICD-10-CM | POA: Diagnosis not present

## 2021-03-13 DIAGNOSIS — Z79899 Other long term (current) drug therapy: Secondary | ICD-10-CM

## 2021-03-13 DIAGNOSIS — M47816 Spondylosis without myelopathy or radiculopathy, lumbar region: Secondary | ICD-10-CM | POA: Diagnosis present

## 2021-03-13 DIAGNOSIS — Z809 Family history of malignant neoplasm, unspecified: Secondary | ICD-10-CM

## 2021-03-13 DIAGNOSIS — Z951 Presence of aortocoronary bypass graft: Secondary | ICD-10-CM | POA: Diagnosis not present

## 2021-03-13 DIAGNOSIS — D469 Myelodysplastic syndrome, unspecified: Secondary | ICD-10-CM | POA: Diagnosis present

## 2021-03-13 DIAGNOSIS — D696 Thrombocytopenia, unspecified: Secondary | ICD-10-CM

## 2021-03-13 DIAGNOSIS — I10 Essential (primary) hypertension: Secondary | ICD-10-CM | POA: Diagnosis not present

## 2021-03-13 DIAGNOSIS — R600 Localized edema: Secondary | ICD-10-CM | POA: Diagnosis not present

## 2021-03-13 DIAGNOSIS — E871 Hypo-osmolality and hyponatremia: Secondary | ICD-10-CM | POA: Diagnosis present

## 2021-03-13 DIAGNOSIS — R0789 Other chest pain: Secondary | ICD-10-CM | POA: Diagnosis not present

## 2021-03-13 DIAGNOSIS — M5416 Radiculopathy, lumbar region: Secondary | ICD-10-CM | POA: Diagnosis not present

## 2021-03-13 DIAGNOSIS — I5031 Acute diastolic (congestive) heart failure: Secondary | ICD-10-CM | POA: Diagnosis not present

## 2021-03-13 DIAGNOSIS — R079 Chest pain, unspecified: Secondary | ICD-10-CM | POA: Diagnosis not present

## 2021-03-13 DIAGNOSIS — I2510 Atherosclerotic heart disease of native coronary artery without angina pectoris: Secondary | ICD-10-CM | POA: Diagnosis present

## 2021-03-13 DIAGNOSIS — M79669 Pain in unspecified lower leg: Secondary | ICD-10-CM | POA: Diagnosis not present

## 2021-03-13 LAB — COMPREHENSIVE METABOLIC PANEL
ALT: 18 U/L (ref 0–44)
AST: 26 U/L (ref 15–41)
Albumin: 2.9 g/dL — ABNORMAL LOW (ref 3.5–5.0)
Alkaline Phosphatase: 72 U/L (ref 38–126)
Anion gap: 11 (ref 5–15)
BUN: 26 mg/dL — ABNORMAL HIGH (ref 8–23)
CO2: 23 mmol/L (ref 22–32)
Calcium: 8.3 mg/dL — ABNORMAL LOW (ref 8.9–10.3)
Chloride: 98 mmol/L (ref 98–111)
Creatinine, Ser: 1.36 mg/dL — ABNORMAL HIGH (ref 0.61–1.24)
GFR, Estimated: 54 mL/min — ABNORMAL LOW (ref >60)
Glucose, Bld: 209 mg/dL — ABNORMAL HIGH (ref 70–99)
Potassium: 4 mmol/L (ref 3.5–5.1)
Sodium: 132 mmol/L — ABNORMAL LOW (ref 135–145)
Total Bilirubin: 0.5 mg/dL (ref 0.3–1.2)
Total Protein: 5.5 g/dL — ABNORMAL LOW (ref 6.5–8.1)

## 2021-03-13 LAB — RESP PANEL BY RT-PCR (FLU A&B, COVID) ARPGX2
Influenza A by PCR: NEGATIVE
Influenza B by PCR: NEGATIVE
SARS Coronavirus 2 by RT PCR: NEGATIVE

## 2021-03-13 LAB — CBC
HCT: 21.8 % — ABNORMAL LOW (ref 39.0–52.0)
Hemoglobin: 7.7 g/dL — ABNORMAL LOW (ref 13.0–17.0)
MCH: 35.2 pg — ABNORMAL HIGH (ref 26.0–34.0)
MCHC: 35.3 g/dL (ref 30.0–36.0)
MCV: 99.5 fL (ref 80.0–100.0)
Platelets: 16 10*3/uL — CL (ref 150–400)
RBC: 2.19 MIL/uL — ABNORMAL LOW (ref 4.22–5.81)
RDW: 15.2 % (ref 11.5–15.5)
WBC: 1.3 10*3/uL — CL (ref 4.0–10.5)
nRBC: 0 % (ref 0.0–0.2)

## 2021-03-13 LAB — PROTIME-INR
INR: 1.2 (ref 0.8–1.2)
Prothrombin Time: 14.9 seconds (ref 11.4–15.2)

## 2021-03-13 LAB — GLUCOSE, CAPILLARY: Glucose-Capillary: 219 mg/dL — ABNORMAL HIGH (ref 70–99)

## 2021-03-13 LAB — TROPONIN I (HIGH SENSITIVITY)
Troponin I (High Sensitivity): 10 ng/L (ref <18)
Troponin I (High Sensitivity): 10 ng/L (ref <18)

## 2021-03-13 LAB — BRAIN NATRIURETIC PEPTIDE: B Natriuretic Peptide: 57.6 pg/mL (ref 0.0–100.0)

## 2021-03-13 IMAGING — CR DG CHEST 2V
2 series · 2 of 2 positions shown · non-contrast
Comparison: [DATE].

CLINICAL DATA: Chest pain.

EXAM:
CHEST - 2 VIEW

[chest pa]
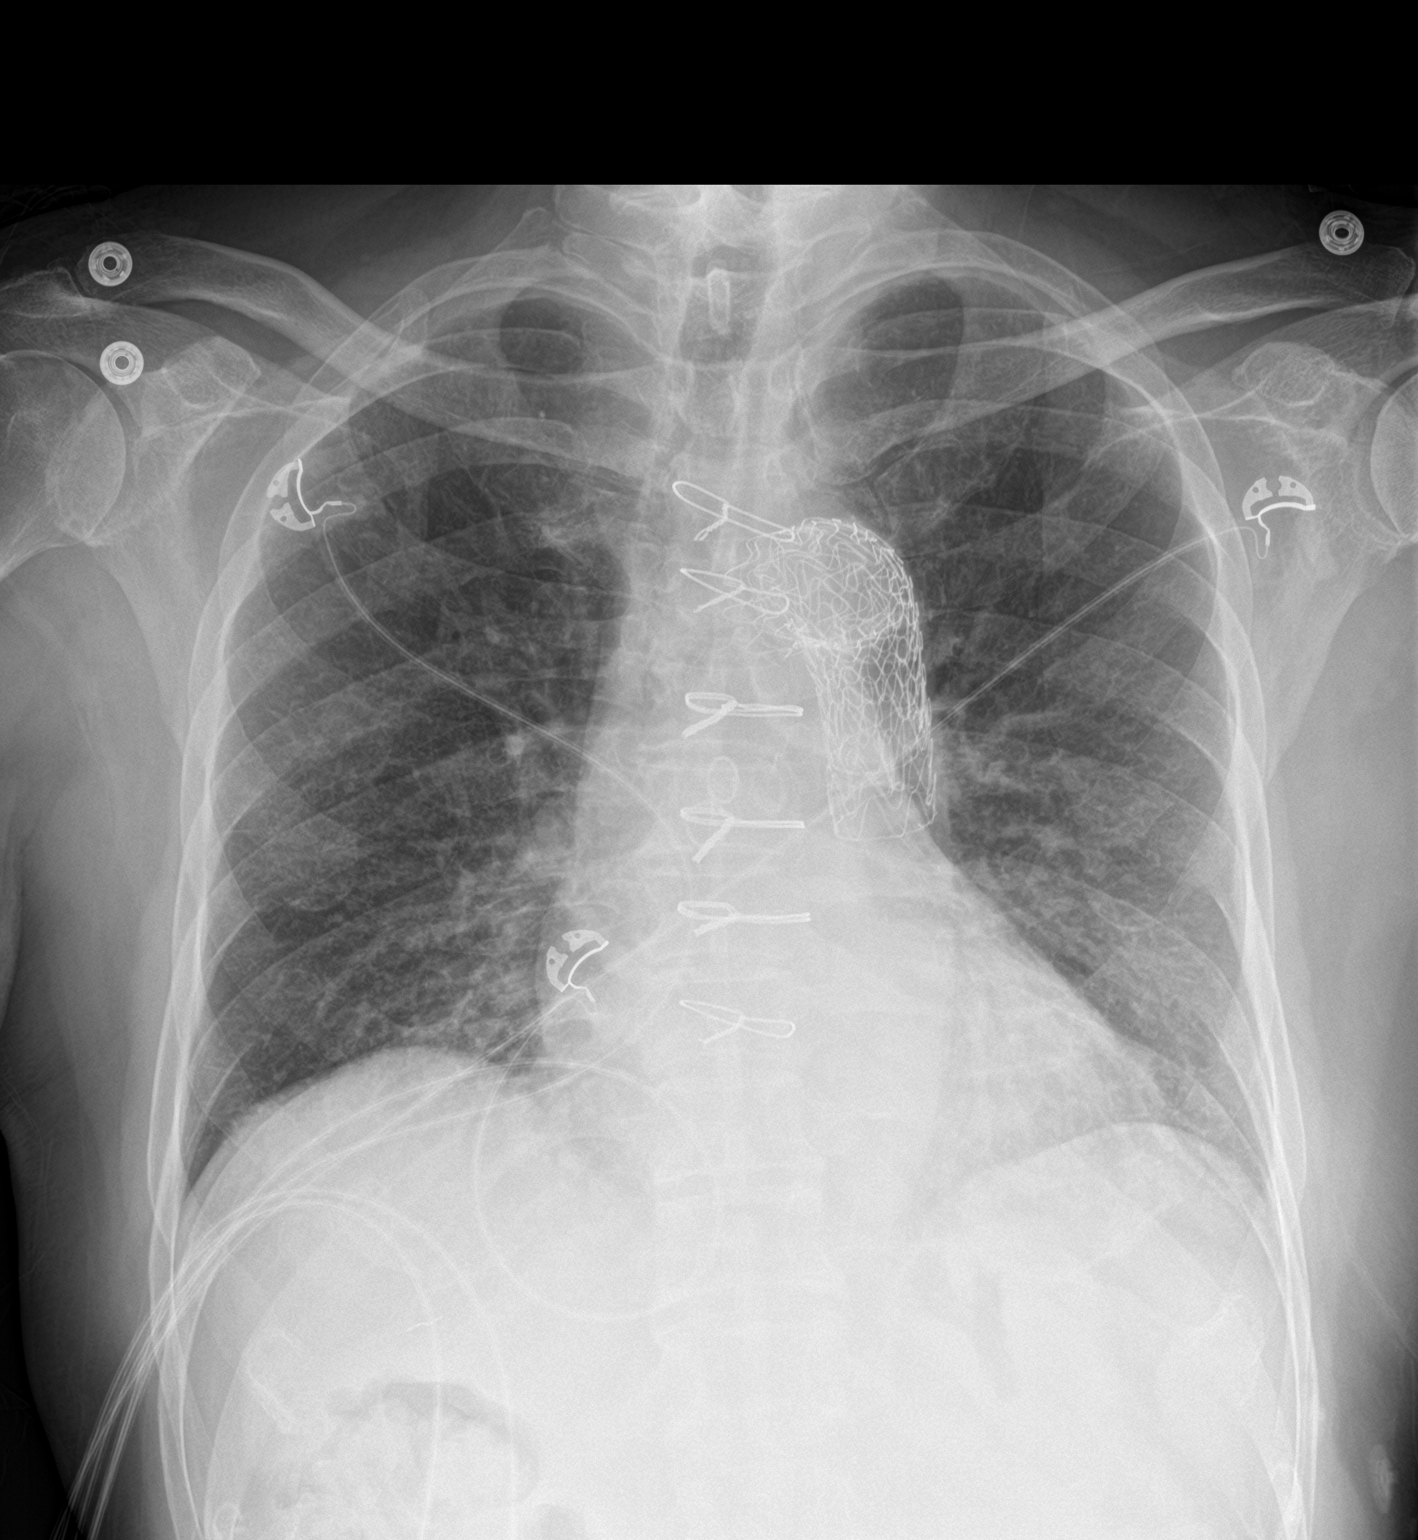

[chest lat]
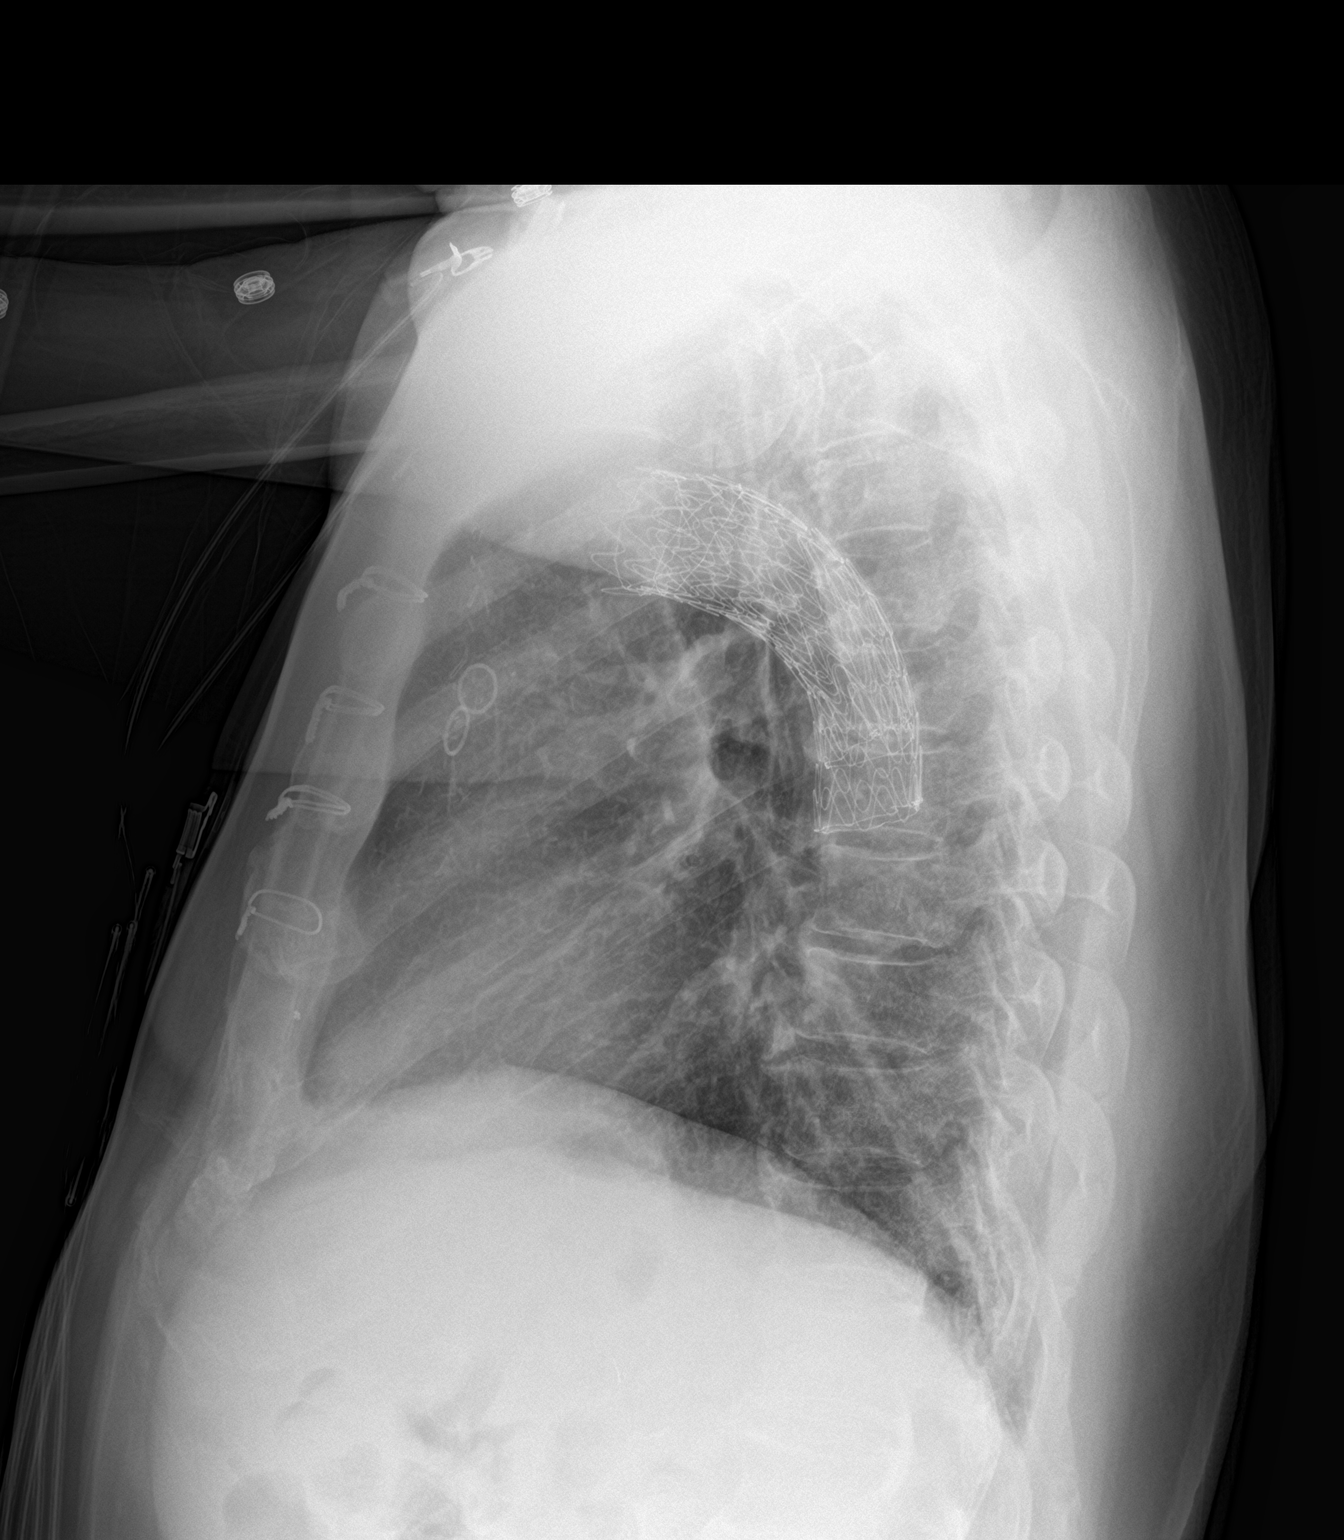

[2 of 2 positions shown; findings below may reference images not displayed]

FINDINGS: The heart size and mediastinal contours are within normal limits.
Status post coronary bypass graft. Stable stent graft seen in
descending thoracic aorta. Both lungs are clear. The visualized
skeletal structures are unremarkable.
IMPRESSION: No active cardiopulmonary disease.

## 2021-03-13 MED ORDER — SODIUM CHLORIDE 0.9% FLUSH
3.0000 mL | Freq: Two times a day (BID) | INTRAVENOUS | Status: DC
  Administered 2021-03-13 – 2021-03-17 (×7): 3 mL via INTRAVENOUS

## 2021-03-13 MED ORDER — FUROSEMIDE 10 MG/ML IJ SOLN
40.0000 mg | Freq: Once | INTRAMUSCULAR | Status: AC
  Administered 2021-03-13: 40 mg via INTRAVENOUS
  Filled 2021-03-13: qty 4

## 2021-03-13 MED ORDER — PROCHLORPERAZINE MALEATE 10 MG PO TABS
10.0000 mg | ORAL_TABLET | Freq: Four times a day (QID) | ORAL | Status: DC | PRN
  Filled 2021-03-13: qty 1

## 2021-03-13 MED ORDER — SODIUM CHLORIDE 0.9 % IV SOLN
250.0000 mL | INTRAVENOUS | Status: DC | PRN
Start: 2021-03-13 — End: 2021-03-17

## 2021-03-13 MED ORDER — HYDRALAZINE HCL 25 MG PO TABS: 25.0000 mg | ORAL_TABLET | Freq: Four times a day (QID) | ORAL | Status: DC | PRN

## 2021-03-13 MED ORDER — LIDOCAINE 5 % EX PTCH
1.0000 | MEDICATED_PATCH | CUTANEOUS | Status: DC
  Administered 2021-03-14 – 2021-03-16 (×3): 1 via TRANSDERMAL
  Filled 2021-03-13 (×3): qty 1

## 2021-03-13 MED ORDER — PANTOPRAZOLE SODIUM 40 MG PO TBEC
40.0000 mg | DELAYED_RELEASE_TABLET | Freq: Every day | ORAL | Status: DC
  Administered 2021-03-13 – 2021-03-17 (×5): 40 mg via ORAL
  Filled 2021-03-13 (×5): qty 1

## 2021-03-13 MED ORDER — GABAPENTIN 300 MG PO CAPS: 300.0000 mg | ORAL_CAPSULE | Freq: Every day | ORAL | Status: DC

## 2021-03-13 MED ORDER — INFLUENZA VAC HIGH-DOSE QUAD 0.7 ML IM SUSY: 1.0000 | PREFILLED_SYRINGE | Freq: Once | INTRAMUSCULAR | Status: DC

## 2021-03-13 MED ORDER — GLIMEPIRIDE 1 MG PO TABS
1.0000 mg | ORAL_TABLET | Freq: Every day | ORAL | Status: DC
  Administered 2021-03-14: 1 mg via ORAL
  Filled 2021-03-13: qty 1

## 2021-03-13 MED ORDER — LORAZEPAM 0.5 MG PO TABS: 0.5000 mg | ORAL_TABLET | Freq: Four times a day (QID) | ORAL | Status: DC | PRN

## 2021-03-13 MED ORDER — ONDANSETRON HCL 4 MG/2ML IJ SOLN: 4.0000 mg | Freq: Four times a day (QID) | INTRAMUSCULAR | Status: DC | PRN

## 2021-03-13 MED ORDER — OXYCODONE HCL 5 MG PO TABS
5.0000 mg | ORAL_TABLET | ORAL | Status: DC | PRN
  Administered 2021-03-13 – 2021-03-14 (×4): 5 mg via ORAL
  Filled 2021-03-13 (×5): qty 1

## 2021-03-13 MED ORDER — SODIUM CHLORIDE 0.9% FLUSH: 3.0000 mL | INTRAVENOUS | Status: DC | PRN

## 2021-03-13 MED ORDER — ACYCLOVIR 400 MG PO TABS
400.0000 mg | ORAL_TABLET | Freq: Two times a day (BID) | ORAL | Status: DC
  Administered 2021-03-13 – 2021-03-17 (×8): 400 mg via ORAL
  Filled 2021-03-13 (×9): qty 1

## 2021-03-13 MED ORDER — LACTULOSE 10 GM/15ML PO SOLN
10.0000 g | Freq: Every day | ORAL | Status: DC | PRN
  Administered 2021-03-16: 10 g via ORAL
  Filled 2021-03-13: qty 15

## 2021-03-13 MED ORDER — ACETAMINOPHEN 325 MG PO TABS
650.0000 mg | ORAL_TABLET | ORAL | Status: DC | PRN
  Administered 2021-03-13 – 2021-03-17 (×16): 650 mg via ORAL
  Filled 2021-03-13 (×17): qty 2

## 2021-03-13 MED ORDER — TRAZODONE HCL 50 MG PO TABS
50.0000 mg | ORAL_TABLET | Freq: Every day | ORAL | Status: DC
  Administered 2021-03-13 – 2021-03-14 (×2): 100 mg via ORAL
  Filled 2021-03-13 (×2): qty 2

## 2021-03-13 MED ORDER — ACETAMINOPHEN 325 MG PO TABS
650.0000 mg | ORAL_TABLET | Freq: Once | ORAL | Status: AC
Start: 2021-03-13 — End: 2021-03-13
  Administered 2021-03-13: 650 mg via ORAL
  Filled 2021-03-13: qty 2

## 2021-03-13 MED ORDER — ELTROMBOPAG OLAMINE 50 MG PO TABS: 100.0000 mg | ORAL_TABLET | Freq: Every day | ORAL | Status: DC

## 2021-03-13 MED ORDER — CARVEDILOL 3.125 MG PO TABS
3.1250 mg | ORAL_TABLET | Freq: Two times a day (BID) | ORAL | Status: DC
  Administered 2021-03-14: 3.125 mg via ORAL
  Filled 2021-03-13: qty 1

## 2021-03-13 MED ORDER — ATORVASTATIN CALCIUM 40 MG PO TABS
40.0000 mg | ORAL_TABLET | Freq: Every day | ORAL | Status: DC
  Administered 2021-03-14 – 2021-03-17 (×4): 40 mg via ORAL
  Filled 2021-03-13 (×4): qty 1

## 2021-03-13 MED ORDER — INSULIN ASPART 100 UNIT/ML IJ SOLN
0.0000 [IU] | Freq: Three times a day (TID) | INTRAMUSCULAR | Status: DC
  Administered 2021-03-14: 5 [IU] via SUBCUTANEOUS
  Administered 2021-03-14 – 2021-03-15 (×3): 3 [IU] via SUBCUTANEOUS
  Administered 2021-03-15: 8 [IU] via SUBCUTANEOUS
  Administered 2021-03-16: 2 [IU] via SUBCUTANEOUS
  Administered 2021-03-16: 3 [IU] via SUBCUTANEOUS
  Administered 2021-03-16 – 2021-03-17 (×3): 5 [IU] via SUBCUTANEOUS

## 2021-03-13 MED ORDER — ALLOPURINOL 100 MG PO TABS: 300.0000 mg | ORAL_TABLET | Freq: Every day | ORAL | Status: DC

## 2021-03-13 MED ORDER — FUROSEMIDE 10 MG/ML IJ SOLN
40.0000 mg | Freq: Two times a day (BID) | INTRAMUSCULAR | Status: DC
  Administered 2021-03-14: 40 mg via INTRAVENOUS
  Filled 2021-03-13: qty 4

## 2021-03-13 NOTE — ED Triage Notes (Signed)
Patient coming from home, complaint of chest pain, back pain and lower extremity edema taht began approx 3 weeks ago. Hx of CABG here in 2014. ?

## 2021-03-13 NOTE — ED Notes (Signed)
Called lab regarding adding on differential to already collected labs. Per lab they will see if they can add it on.  ?

## 2021-03-13 NOTE — H&P (Addendum)
History and Physical    Timothy Murray XTK:[id number] DOB: 1945/01/18 DOA: 03/13/2021  PCP: Perrin Maltese, MD (Confirm with patient/family/NH records and if not entered, this has to be entered at [facility] point of entry) Patient coming from: Home  I have personally briefly reviewed patient's old medical records in [place]  Chief Complaint: Increasing leg swelling, chest pains.  HPI: Timothy Murray is a 76 y.o. male with medical history significant of MDS with chronic pancytopenia, cirrhosis, CAD s/p CABG 2014, ascending thoracic ulcer s/p stenting 2019, IIDM, peripheral neuropathy, HTN, chronic back pain, who came in today for worsening of leg swelling  Symptoms started 1 week ago, with increasing leg swelling and exertional dyspnea.  2 weeks ago patient has had worsening of back pain, and he visited with orthopedic surgery who did an MRI which showed chronic degenerative changes but no acute findings.  And patient was started 7 days course of p.o. steroid which he completed 1 week ago.  Patient also complained about burning-like chest pain for about 1 week, does take omeprazole twice daily for GERD, the chest pain has been constant, worse in the morning, localized nonradiating.  Patient has MDS, chronic pancytopenia for which he was started on Promacta recently and due to lack of response the dosage was just doubled one week ago, and his PLT has been stabilized around 15. No bleeding.  ED Course: Patient was found tachypneic, tachycardia and elevated blood pressure.  No hypoxia no fever.  Chest x-ray unremarkable.  Patient does not show fluid overload with bilateral pitting edema.  Blood work creatinine 1.3, WBC 1.7 hemoglobin 7.7, platelet 16.  Patient received 1 dose of 40 mg IV Lasix in the ED.  Review of Systems: As per HPI otherwise 14 point review of systems negative.    Past Medical History:  Diagnosis Date   Arthritis    "lower back" (10/24/2012)   Coronary artery  disease    GERD (gastroesophageal reflux disease)    High cholesterol    Hypertension    Liver cancer ([place])    MDS (myelodysplastic syndrome) ([place])    Type II diabetes mellitus ([place])     Past Surgical History:  Procedure Laterality Date   APPENDECTOMY  2002   CARDIAC CATHETERIZATION  10/24/2012   CORONARY ARTERY BYPASS GRAFT N/A 10/26/2012   Procedure: CORONARY ARTERY BYPASS GRAFTING (CABG);  Surgeon: Melrose Nakayama, MD;  Location: [facility];  Service: Open Heart Surgery;  Laterality: N/A;  Times 3 using left internal mammary artery and endoscopically harvested right saphenous vein   ERCP N/A 04/25/2020   Procedure: ENDOSCOPIC RETROGRADE CHOLANGIOPANCREATOGRAPHY (ERCP);  Surgeon: Lucilla Lame, MD;  Location: [facility] ENDOSCOPY;  Service: Endoscopy;  Laterality: N/A;   IR BONE MARROW BIOPSY & ASPIRATION  01/26/2021     reports that he quit smoking about 19 years ago. His smoking use included cigarettes. He has a 15.00 pack-year smoking history. He has never used smokeless tobacco. He reports that he does not drink alcohol and does not use drugs.  Allergies  Allergen Reactions   Dome-Paste Bandage [Wound Dressings]     All bandaids cause a rash   Soap & Cleansers     "liquid soap" antibacterial -rash   Latex Rash and Itching    Family History  Problem Relation Age of Onset   Cancer Sister      Prior to Admission medications   Medication Sig Start Date End Date Taking? Authorizing Provider  acyclovir (ZOVIRAX) 400 MG tablet TAKE  1 TABLET BY MOUTH TWICE A DAY Patient taking differently: 400 mg 2 (two) times daily. 02/20/21  Yes Sindy Guadeloupe, MD  allopurinol (ZYLOPRIM) 300 MG tablet TAKE 1 TABLET BY MOUTH EVERY DAY 03/06/21   Sindy Guadeloupe, MD  atorvastatin (LIPITOR) 40 MG tablet Take by mouth. 06/18/20   [provider]  Blood Glucose Monitoring Suppl ([place]) w/Device KIT 1 each by Does not apply route in the morning and at bedtime. 02/06/21   [provider]  eltrombopag (PROMACTA) 50 MG tablet Take 2 tablets (100 mg total) by mouth daily. Take on an empty stomach 1 hour before a meal or 2 hours after 03/09/21   Sindy Guadeloupe, MD  FLUZONE HIGH-DOSE QUADRIVALENT 0.7 ML SUSY Inject 1 each into the skin once. 11/04/20   [provider]  gabapentin (NEURONTIN) 300 MG capsule Take 1 capsule (300 mg total) by mouth at bedtime. 03/06/21   Sindy Guadeloupe, MD  glimepiride (AMARYL) 1 MG tablet Take 1 mg by mouth daily.    [provider]  lactulose (CHRONULAC) 10 GM/15ML solution Take 15 - 30 ml daily as needed for constipation 02/17/21   Borders, Kirt Boys, NP  Lancets (ONETOUCH DELICA PLUS [id number]) MISC Apply 1 each topically 2 (two) times daily. 02/06/21   [provider]  levofloxacin (LEVAQUIN) 500 MG tablet TAKE 1 TABLET (500 MG TOTAL) BY MOUTH DAILY. 02/03/21   Sindy Guadeloupe, MD  loratadine (CLARITIN) 10 MG tablet Take 5 mg by mouth daily as needed. Take half a tablet (34m) daily    [provider]  LORazepam (ATIVAN) 0.5 MG tablet Take 1 tablet (0.5 mg total) by mouth every 6 (six) hours as needed (Nausea or vomiting). Patient not taking: Reported on 06/05/2020 05/26/20   RSindy Guadeloupe MD  losartan (COZAAR) 25 MG tablet Take 25 mg by mouth daily. Patient not taking: Reported on 09/15/2020    [provider]  metoprolol tartrate (LOPRESSOR) 25 MG tablet Take 25 mg by mouth daily.    [provider]  nitroGLYCERIN (NITROSTAT) 0.3 MG SL tablet nitroglycerin 0.3 mg sublingual tablet  PLACE ONE TABLET (0.3 MG DOSE) UNDER THE TONGUE EVERY 5 (FIVE) MINUTES AS NEEDED FOR CHEST PAIN. Patient not taking: Reported on 06/23/2020    [provider]  omeprazole (PRILOSEC) 40 MG capsule TAKE 1 CAPSULE BY MOUTH TWICE A DAY 11/30/20   RSindy Guadeloupe MD  ondansetron (ZOFRAN) 8 MG tablet Take 1 tablet (8 mg total) by mouth 2 (two) times daily as needed (Nausea or vomiting). Patient not taking: Reported  on 06/05/2020 05/26/20   RSindy Guadeloupe MD  [facility] [place] test strip 1 each by Other route 2 (two) times daily. 02/06/21   [provider]  oxyCODONE (OXY IR/ROXICODONE) 5 MG immediate release tablet Take 1 tablet (5 mg total) by mouth every 4 (four) hours as needed for severe pain. 03/06/21   RSindy Guadeloupe MD  predniSONE (DELTASONE) 20 MG tablet TAKE 1 TABLET BY MOUTH DAILY X 5 DAYS IN AM WITH FOOD Patient not taking: Reported on 09/15/2020 03/20/20   [provider]  prochlorperazine (COMPAZINE) 10 MG tablet Take 1 tablet (10 mg total) by mouth every 6 (six) hours as needed (Nausea or vomiting). Patient not taking: Reported on 09/15/2020 05/26/20   RSindy Guadeloupe MD  senna-docusate (SENOKOT-S) 8.6-50 MG tablet Take 1 tablet by mouth every morning.    [provider]  traZODone (DESYREL)  50 MG tablet TAKE 1 TO 2 TABLETS BY MOUTH AT BEDTIME 01/29/21   Sindy Guadeloupe, MD    Physical Exam: Vitals:   03/13/21 1730 03/13/21 1745 03/13/21 1800 03/13/21 1815  BP: (!) 158/76 (!) 132/53 (!) 157/73 (!) 145/62  Pulse: (!) 105 99 (!) 122 (!) 109  Resp:  (!) 29 (!) 29 (!) 23  SpO2: 100% 100% 98% 97%  Weight:      Height:        Constitutional: NAD, calm, comfortable Vitals:   03/13/21 1730 03/13/21 1745 03/13/21 1800 03/13/21 1815  BP: (!) 158/76 (!) 132/53 (!) 157/73 (!) 145/62  Pulse: (!) 105 99 (!) 122 (!) 109  Resp:  (!) 29 (!) 29 (!) 23  SpO2: 100% 100% 98% 97%  Weight:      Height:       Eyes: PERRL, lids and conjunctivae normal ENMT: Mucous membranes are moist. Posterior pharynx clear of any exudate or lesions.Normal dentition.  Neck: normal, supple, no masses, no thyromegaly Respiratory: clear to auscultation bilaterally, no wheezing, fine crackles on B/L lower fields right>left. Increasing respiratory effort. No accessory muscle use.  Cardiovascular: Regular rate and rhythm, no murmurs / rubs / gallops. 2+ extremity edema. 2+ pedal pulses. No carotid bruits.   Abdomen: no tenderness, no masses palpated. No hepatosplenomegaly. Bowel sounds positive.  Musculoskeletal: no clubbing / cyanosis. No joint deformity upper and lower extremities. Good ROM, no contractures. Normal muscle tone. Tenderness on the L5-S1 paraspinal area, not irradiating. Skin: no rashes, lesions, ulcers. No induration Neurologic: CN 2-12 grossly intact. Sensation intact, DTR normal. Strength 5/5 in all 4.  Psychiatric: Normal judgment and insight. Alert and oriented x 3. Normal mood.     Labs on Admission: I have personally reviewed following labs and imaging studies  CBC: Recent Labs  Lab 03/09/21 0818 03/13/21 1538  WBC 0.9* 1.3*  NEUTROABS 0.3*  --   HGB 8.6* 7.7*  HCT 24.5* 21.8*  MCV 99.2 99.5  PLT 22* 16*   Basic Metabolic Panel: Recent Labs  Lab 03/09/21 0818 03/13/21 1534  NA 133* 132*  K 4.1 4.0  CL 98 98  CO2 25 23  GLUCOSE 307* 209*  BUN 23 26*  CREATININE 1.17 1.36*  CALCIUM 8.4* 8.3*   GFR: Estimated Creatinine Clearance: 51.5 mL/min (A) (by C-G formula based on SCr of 1.36 mg/dL (H)). Liver Function Tests: Recent Labs  Lab 03/09/21 0818 03/13/21 1534  AST 22 26  ALT 24 18  ALKPHOS 58 72  BILITOT 0.6 0.5  PROT 6.4* 5.5*  ALBUMIN 3.5 2.9*   No results for input(s): LIPASE, AMYLASE in the last 168 hours. No results for input(s): AMMONIA in the last 168 hours. Coagulation Profile: No results for input(s): INR, PROTIME in the last 168 hours. Cardiac Enzymes: No results for input(s): CKTOTAL, CKMB, CKMBINDEX, TROPONINI in the last 168 hours. BNP (last 3 results) No results for input(s): PROBNP in the last 8760 hours. HbA1C: No results for input(s): HGBA1C in the last 72 hours. CBG: No results for input(s): GLUCAP in the last 168 hours. Lipid Profile: No results for input(s): CHOL, HDL, LDLCALC, TRIG, CHOLHDL, LDLDIRECT in the last 72 hours. Thyroid Function Tests: No results for input(s): TSH, T4TOTAL, FREET4, T3FREE, THYROIDAB  in the last 72 hours. Anemia Panel: No results for input(s): VITAMINB12, FOLATE, FERRITIN, TIBC, IRON, RETICCTPCT in the last 72 hours. Urine analysis:    Component Value Date/Time   COLORURINE YELLOW (A) 02/27/2021 1630   APPEARANCEUR  CLEAR (A) 02/27/2021 1630   APPEARANCEUR Clear 07/02/2011 0128   LABSPEC 1.011 02/27/2021 1630   LABSPEC 1.010 07/02/2011 0128   PHURINE 6.0 02/27/2021 1630   GLUCOSEU NEGATIVE 02/27/2021 1630   GLUCOSEU Negative 07/02/2011 0128   HGBUR NEGATIVE 02/27/2021 1630   BILIRUBINUR NEGATIVE 02/27/2021 1630   BILIRUBINUR Negative 07/02/2011 0128   KETONESUR NEGATIVE 02/27/2021 1630   PROTEINUR NEGATIVE 02/27/2021 1630   UROBILINOGEN 0.2 10/25/2012 0517   NITRITE NEGATIVE 02/27/2021 1630   LEUKOCYTESUR NEGATIVE 02/27/2021 1630   LEUKOCYTESUR Negative 07/02/2011 0128    Radiological Exams on Admission: DG Chest 2 View  Result Date: 03/13/2021 CLINICAL DATA:  Chest pain. EXAM: CHEST - 2 VIEW COMPARISON:  February 27, 2021. FINDINGS: The heart size and mediastinal contours are within normal limits. Status post coronary bypass graft. Stable stent graft seen in descending thoracic aorta. Both lungs are clear. The visualized skeletal structures are unremarkable. IMPRESSION: No active cardiopulmonary disease. Electronically Signed   By: Marijo Conception M.D.   On: 03/13/2021 16:40    EKG: Independently reviewed.  Sinus rhythm, no acute ST changes.  Assessment/Plan Principal Problem:   CHF (congestive heart failure) (HCC) Active Problems:   Acute on chronic diastolic CHF (congestive heart failure) ([place])  (please populate well all problems here in Problem List. (For example, if patient is on BP meds at home and you resume or decide to hold them, it is a problem that needs to be her. Same for CAD, COPD, HLD and so on)  Acute diastolic CHF decompensation -Significant fluid overload with right-sided crackles and new onset of 2+ pitting edema bilaterally. -Received  Lasix 40 mg IV in ED, will continue IV diuresis Lasix 40 mg twice daily -Echocardiogram -Blood pressure significantly elevated, will change metoprolol to Coreg for better BP control. -Other Ddx, increasing peripheral edema might also be side effect of recent use of steroid last week and according to pharm info current Promacta with a significant increase dosage concurrent with increasing swelling. But will still need to rule out CHF.  Chest pain -Atypical, most likely from uncontrolled GERD which probably worsened with recent steroid use. -Continue PPI -Echo -Outpatient cardiology follow up.  HTN, uncontrolled -Change Toprol to Coreg -As needed hydralazine  MDS with chronic pancytopenia -Oncology was contacted, recommend maintain platelet> 16, hemoglobin> 7.  No indication for PRBC transfusion or platelets for now.  History of cirrhosis -No symptoms or signs of acute decompensation, no ascites signs, LFTs largely normal except mild hypoalbuminemia -Synthetic function has been preserved, with mild hypoalbuminemia, check INR. -No indication for albumin infusion at this point.  CAD s/p CABG -Trop negative x1, no acute ST-T changes on EKG compared to before -Echocardiogram -Outpatient cardiology follow-up.  IIDM with hyperglycemia -Continue sulfonylurea, add sliding scale.  Chronic back pain -MRI 2 days ago showed no acute finding but chronic degenerative changes. -PT evaluation   DVT prophylaxis: SCD Code Status: Full code Family Communication: Wife at bedside Disposition Plan: Expect more than 2 midnight hospital stay for IV diuresis and CHF workup. Consults called: None Admission status: Tele admit   Lequita Halt MD [facility] Pager [phone]  03/13/2021, 6:23 PM

## 2021-03-13 NOTE — Telephone Encounter (Signed)
Call returned to [facility] and advised of doctor response. She states that patient is at Ortho apt right now and wen he is done there, she will take him to the ER ?

## 2021-03-13 NOTE — ED Provider Notes (Cosign Needed)
[place] EMERGENCY DEPARTMENT  Provider Note  CSN: [id number] Arrival date & time: 03/13/21 1459  History Chief Complaint  Patient presents with   Chest Pain   Back Pain   Leg Swelling    Timothy Murray is a 76 y.o. male who presents today with multiple days of a variety of symptoms.  He states his back pain has been worsening over the last 3 weeks.  He is also been having some intermittent chest pain.  Over the last 5 days he has had worsening leg swelling.  Family is noted that he seems to be getting around slower.  He seems fatigued when he is up and walking.  He denies any recent travel.  No leg pain, particularly unilateral leg pain.   Home Medications Prior to Admission medications   Medication Sig Start Date End Date Taking? Authorizing Provider  acyclovir (ZOVIRAX) 400 MG tablet TAKE 1 TABLET BY MOUTH TWICE A DAY 02/20/21   Sindy Guadeloupe, MD  allopurinol (ZYLOPRIM) 300 MG tablet TAKE 1 TABLET BY MOUTH EVERY DAY 03/06/21   Sindy Guadeloupe, MD  atorvastatin (LIPITOR) 40 MG tablet Take by mouth. 06/18/20   [provider]  Blood Glucose Monitoring Suppl ([place]) w/Device KIT 1 each by Does not apply route in the morning and at bedtime. 02/06/21   [provider]  eltrombopag (PROMACTA) 50 MG tablet Take 2 tablets (100 mg total) by mouth daily. Take on an empty stomach 1 hour before a meal or 2 hours after 03/09/21   Sindy Guadeloupe, MD  FLUZONE HIGH-DOSE QUADRIVALENT 0.7 ML SUSY Inject 1 each into the skin once. 11/04/20   [provider]  gabapentin (NEURONTIN) 300 MG capsule Take 1 capsule (300 mg total) by mouth at bedtime. 03/06/21   Sindy Guadeloupe, MD  glimepiride (AMARYL) 1 MG tablet Take 1 mg by mouth daily.    [provider]  lactulose (CHRONULAC) 10 GM/15ML solution Take 15 - 30 ml daily as needed for constipation 02/17/21   Borders, Kirt Boys, NP  Lancets (ONETOUCH DELICA PLUS [id number]) MISC Apply 1 each  topically 2 (two) times daily. 02/06/21   [provider]  levofloxacin (LEVAQUIN) 500 MG tablet TAKE 1 TABLET (500 MG TOTAL) BY MOUTH DAILY. 02/03/21   Sindy Guadeloupe, MD  loratadine (CLARITIN) 10 MG tablet Take 5 mg by mouth daily as needed. Take half a tablet (32m) daily    [provider]  LORazepam (ATIVAN) 0.5 MG tablet Take 1 tablet (0.5 mg total) by mouth every 6 (six) hours as needed (Nausea or vomiting). Patient not taking: Reported on 06/05/2020 05/26/20   RSindy Guadeloupe MD  losartan (COZAAR) 25 MG tablet Take 25 mg by mouth daily. Patient not taking: Reported on 09/15/2020    [provider]  metoprolol tartrate (LOPRESSOR) 25 MG tablet Take 25 mg by mouth daily.    [provider]  nitroGLYCERIN (NITROSTAT) 0.3 MG SL tablet nitroglycerin 0.3 mg sublingual tablet  PLACE ONE TABLET (0.3 MG DOSE) UNDER THE TONGUE EVERY 5 (FIVE) MINUTES AS NEEDED FOR CHEST PAIN. Patient not taking: Reported on 06/23/2020    [provider]  omeprazole (PRILOSEC) 40 MG capsule TAKE 1 CAPSULE BY MOUTH TWICE A DAY 11/30/20   RSindy Guadeloupe MD  ondansetron (ZOFRAN) 8 MG tablet Take 1 tablet (8 mg total) by mouth 2 (two) times daily as needed (Nausea or vomiting). Patient not taking: Reported on 06/05/2020 05/26/20  Sindy Guadeloupe, MD  [facility] VERIO test strip 1 each by Other route 2 (two) times daily. 02/06/21   [provider]  oxyCODONE (OXY IR/ROXICODONE) 5 MG immediate release tablet Take 1 tablet (5 mg total) by mouth every 4 (four) hours as needed for severe pain. 03/06/21   Sindy Guadeloupe, MD  predniSONE (DELTASONE) 20 MG tablet TAKE 1 TABLET BY MOUTH DAILY X 5 DAYS IN AM WITH FOOD Patient not taking: Reported on 09/15/2020 03/20/20   [provider]  prochlorperazine (COMPAZINE) 10 MG tablet Take 1 tablet (10 mg total) by mouth every 6 (six) hours as needed (Nausea or vomiting). Patient not taking: Reported on 09/15/2020 05/26/20   Sindy Guadeloupe, MD   senna-docusate (SENOKOT-S) 8.6-50 MG tablet Take 1 tablet by mouth every morning.    [provider]  traZODone (DESYREL) 50 MG tablet TAKE 1 TO 2 TABLETS BY MOUTH AT BEDTIME 01/29/21   Sindy Guadeloupe, MD     Allergies    Dome-paste bandage [wound dressings], Soap & cleansers, and Latex   Review of Systems   Review of Systems  Constitutional:  Positive for fatigue. Negative for chills and fever.  HENT:  Negative for ear pain and sore throat.   Eyes:  Negative for pain and visual disturbance.  Respiratory:  Positive for shortness of breath. Negative for cough.   Cardiovascular:  Positive for chest pain and leg swelling. Negative for palpitations.  Gastrointestinal:  Negative for abdominal pain and vomiting.  Genitourinary:  Negative for dysuria and hematuria.  Musculoskeletal:  Positive for back pain. Negative for arthralgias.  Skin:  Negative for color change and rash.  Neurological:  Negative for seizures and syncope.  All other systems reviewed and are negative. Please see HPI for pertinent positives and negatives  Physical Exam BP (!) 132/53    Pulse 99    Resp (!) 29    Ht 6' (1.829 m)    Wt 87.1 kg    SpO2 100%    BMI 26.04 kg/m   Physical Exam Vitals and nursing note reviewed.  Constitutional:      General: He is not in acute distress.    Appearance: He is well-developed. He is ill-appearing.  HENT:     Head: Normocephalic and atraumatic.  Eyes:     Conjunctiva/sclera: Conjunctivae normal.  Cardiovascular:     Rate and Rhythm: Normal rate and regular rhythm.     Heart sounds: No murmur heard. Pulmonary:     Effort: Pulmonary effort is normal. No respiratory distress.     Breath sounds: Normal breath sounds.  Abdominal:     Palpations: Abdomen is soft.     Tenderness: There is no abdominal tenderness.  Musculoskeletal:        General: No swelling.     Cervical back: Neck supple.     Right lower leg: No tenderness. Edema present.     Left lower leg: No  tenderness. Edema present.     Comments: Tenderness throughout low back.  Skin:    General: Skin is warm and dry.     Capillary Refill: Capillary refill takes less than 2 seconds.  Neurological:     Mental Status: He is alert.  Psychiatric:        Mood and Affect: Mood normal.    ED Results / Procedures / Treatments   EKG EKG Interpretation  Date/Time:  Friday March 13 2021 16:47:26 EST Ventricular Rate:  88 PR Interval:  123 QRS  Duration: 91 QT Interval:  383 QTC Calculation: 464 R Axis:   50 Text Interpretation: Sinus rhythm No significant change since last tracing Confirmed by Wandra Arthurs [phone]) on 03/13/2021 5:05:00 PM  Procedures Procedures  Medications Ordered in the ED Medications  furosemide (LASIX) injection 40 mg (40 mg Intravenous Given 03/13/21 1647)  acetaminophen (TYLENOL) tablet 650 mg (650 mg Oral Given 03/13/21 1734)     ED Course       MDM   This patient presents to the ED for concern of leg swelling and pain, this involves an extensive number of treatment options, and is a complaint that carries with it a high risk of complications and morbidity.  The differential diagnosis includes heart failure, metabolic abnormality, worsening MDS, medication side effect. Patients presentation is complicated by their history of MDS, CABG  Additional history obtained: Additional history obtained from family Records reviewed previous admission documents, Care Everywhere/External Records, and Primary Care Documents  Lab Tests: I Ordered, and personally interpreted labs.  The pertinent results include: Decreased hemoglobin, red cell count, white blood cell count, platelets from previous.  Consistent with MDS.  Mild elevation in creatinine.  Mild decrease in GFR.  Imaging Studies ordered: I ordered imaging studies including X-ray chest   I independently visualized and interpreted imaging which showed no acute findings I agree with the radiologist  interpretation  EKG (personally reviewed and interpreted): No STEMI.  No ischemia.   Medical Decision Making: Patient has a very complex history that includes CABG and myelodysplastic syndrome.  He also history of [place].  He presents today with lower extremity edema, back pain.  He appears volume overloaded on exam, he was given Lasix.  BNP was not significantly elevated.  No signs of pulmonary edema on chest x-ray.  He also is down on many cell lines based on his CBC.  These are all worsening from 4 days ago.  We spoke with Dr. Burr Medico from the oncology team regarding the patient's findings.  They recommend a transfusion of 7.0 for hemoglobin and 15 for platelets.  Patient does not need a transfusion at this time.  Etiology of all of his symptoms remains slightly unclear.  However, given his declining status at home and multiple laboratory abnormalities, we will plan for admission to the hospital.  Complexity of problems addressed: Patients presentation is most consistent with  acute presentation with potential threat to life or bodily function  Disposition: After consideration of the diagnostic results and the patients response to treatment,  I feel that the patent would benefit from admission to the hospital .   Patient seen in conjunction with my attending, Dr. Darl Householder.    Final Clinical Impression(s) / ED Diagnoses Final diagnoses:  Low back pain, unspecified back pain laterality, unspecified chronicity, unspecified whether sciatica present  Peripheral edema  Thrombocytopenia (HCC)  Anemia, unspecified type    Rx / DC Orders ED Discharge Orders     None         Jacelyn Pi, MD 03/13/21 1800

## 2021-03-13 NOTE — Telephone Encounter (Signed)
If he is having chest pain he will need to go to ER. Unfortunately echo takes time to be done and cannot be done today

## 2021-03-13 NOTE — ED Notes (Signed)
Patient transported to X-ray 

## 2021-03-13 NOTE — ED Notes (Signed)
Called lab regarding adding the Hemoglobin A1c to previously collected labs. Per lab, they are ableto add it on. ?

## 2021-03-13 NOTE — Telephone Encounter (Signed)
Call received from daughter Lenna Sciara stating that patient is having several issues at this time including ortho and chest pain, She states that he has not had a cardiac toxicity work up and he is s/p CABG 3 She is requesting that he have an ECHO done today. Please advise ?

## 2021-03-14 ENCOUNTER — Inpatient Hospital Stay (HOSPITAL_COMMUNITY): Payer: Medicare HMO

## 2021-03-14 DIAGNOSIS — D61818 Other pancytopenia: Secondary | ICD-10-CM

## 2021-03-14 DIAGNOSIS — E1169 Type 2 diabetes mellitus with other specified complication: Secondary | ICD-10-CM

## 2021-03-14 DIAGNOSIS — I10 Essential (primary) hypertension: Secondary | ICD-10-CM | POA: Diagnosis not present

## 2021-03-14 DIAGNOSIS — I2510 Atherosclerotic heart disease of native coronary artery without angina pectoris: Secondary | ICD-10-CM

## 2021-03-14 DIAGNOSIS — I5031 Acute diastolic (congestive) heart failure: Secondary | ICD-10-CM

## 2021-03-14 DIAGNOSIS — C220 Liver cell carcinoma: Secondary | ICD-10-CM

## 2021-03-14 DIAGNOSIS — I5033 Acute on chronic diastolic (congestive) heart failure: Secondary | ICD-10-CM | POA: Diagnosis not present

## 2021-03-14 DIAGNOSIS — E785 Hyperlipidemia, unspecified: Secondary | ICD-10-CM

## 2021-03-14 DIAGNOSIS — N179 Acute kidney failure, unspecified: Secondary | ICD-10-CM | POA: Diagnosis not present

## 2021-03-14 DIAGNOSIS — R609 Edema, unspecified: Secondary | ICD-10-CM

## 2021-03-14 LAB — BASIC METABOLIC PANEL
Anion gap: 9 (ref 5–15)
BUN: 26 mg/dL — ABNORMAL HIGH (ref 8–23)
CO2: 24 mmol/L (ref 22–32)
Calcium: 8.4 mg/dL — ABNORMAL LOW (ref 8.9–10.3)
Chloride: 100 mmol/L (ref 98–111)
Creatinine, Ser: 1.39 mg/dL — ABNORMAL HIGH (ref 0.61–1.24)
GFR, Estimated: 53 mL/min — ABNORMAL LOW (ref >60)
Glucose, Bld: 194 mg/dL — ABNORMAL HIGH (ref 70–99)
Potassium: 3.7 mmol/L (ref 3.5–5.1)
Sodium: 133 mmol/L — ABNORMAL LOW (ref 135–145)

## 2021-03-14 LAB — ECHOCARDIOGRAM COMPLETE
AR max vel: 2.84 cm2
AV Area VTI: 2.99 cm2
AV Area mean vel: 2.94 cm2
AV Mean grad: 6 mmHg
AV Peak grad: 11 mmHg
Ao pk vel: 1.66 m/s
Area-P 1/2: 3.68 cm2
Height: 72 in
S' Lateral: 2.6 cm
Weight: 3097.02 oz

## 2021-03-14 LAB — DIFFERENTIAL
Abs Immature Granulocytes: 0.01 10*3/uL (ref 0.00–0.07)
Basophils Absolute: 0 10*3/uL (ref 0.0–0.1)
Basophils Relative: 0 %
Eosinophils Absolute: 0 10*3/uL (ref 0.0–0.5)
Eosinophils Relative: 0 %
Immature Granulocytes: 1 %
Lymphocytes Relative: 39 %
Lymphs Abs: 0.4 10*3/uL — ABNORMAL LOW (ref 0.7–4.0)
Monocytes Absolute: 0.1 10*3/uL (ref 0.1–1.0)
Monocytes Relative: 11 %
Neutro Abs: 0.5 10*3/uL — ABNORMAL LOW (ref 1.7–7.7)
Neutrophils Relative %: 49 %

## 2021-03-14 LAB — GLUCOSE, CAPILLARY
Glucose-Capillary: 180 mg/dL — ABNORMAL HIGH (ref 70–99)
Glucose-Capillary: 237 mg/dL — ABNORMAL HIGH (ref 70–99)
Glucose-Capillary: 160 mg/dL — ABNORMAL HIGH (ref 70–99)
Glucose-Capillary: 205 mg/dL — ABNORMAL HIGH (ref 70–99)

## 2021-03-14 LAB — CBC
HCT: 19.2 % — ABNORMAL LOW (ref 39.0–52.0)
Hemoglobin: 7.2 g/dL — ABNORMAL LOW (ref 13.0–17.0)
MCH: 35.8 pg — ABNORMAL HIGH (ref 26.0–34.0)
MCHC: 37.5 g/dL — ABNORMAL HIGH (ref 30.0–36.0)
MCV: 95.5 fL (ref 80.0–100.0)
Platelets: 11 10*3/uL — CL (ref 150–400)
RBC: 2.01 MIL/uL — ABNORMAL LOW (ref 4.22–5.81)
RDW: 15.1 % (ref 11.5–15.5)
WBC: 1 10*3/uL — CL (ref 4.0–10.5)
nRBC: 2 % — ABNORMAL HIGH (ref 0.0–0.2)

## 2021-03-14 MED ORDER — FUROSEMIDE 10 MG/ML IJ SOLN
60.0000 mg | Freq: Two times a day (BID) | INTRAMUSCULAR | Status: DC
  Administered 2021-03-14 – 2021-03-16 (×3): 60 mg via INTRAVENOUS
  Filled 2021-03-14 (×3): qty 6

## 2021-03-14 MED ORDER — DICLOFENAC SODIUM 1 % EX GEL
1.0000 | Freq: Four times a day (QID) | CUTANEOUS | Status: DC
Start: 2021-03-14 — End: 2021-03-17
  Administered 2021-03-14 – 2021-03-17 (×11): 1 via TOPICAL
  Filled 2021-03-14: qty 100

## 2021-03-14 MED ORDER — LEVOFLOXACIN 500 MG PO TABS
500.0000 mg | ORAL_TABLET | Freq: Every day | ORAL | Status: DC
  Administered 2021-03-14 – 2021-03-17 (×4): 500 mg via ORAL
  Filled 2021-03-14 (×4): qty 1

## 2021-03-14 MED ORDER — OXYCODONE HCL 5 MG PO TABS
10.0000 mg | ORAL_TABLET | Freq: Four times a day (QID) | ORAL | Status: DC | PRN
  Administered 2021-03-14 – 2021-03-17 (×11): 10 mg via ORAL
  Filled 2021-03-14 (×12): qty 2

## 2021-03-14 NOTE — Hospital Course (Addendum)
Timothy Murray was admitted to the hospital with the working diagnosis of decompensated heart failure.   76 yo male with the past medical history of myelodysplastic syndrome, chronic thrombocytopenia, cirrhosis, T2DM, and hypertension who presented with chest pain and lower extremity edema. Reported 7 days of worsening lower extremity edema and dyspnea on exertion. On his initial physical examination his blood pressure was 158/76, HR 105. RR 29, 02 saturation 100% on supplemental 02 per [place], lungs with bilateral rales and increased work of breathing, heart with S1 and S2 present and tachycardic with no gallops or murmurs, abdomen soft and positive ++ lower extremity edema.   Na 132, K  4,0 CL 98, bicarbonate 23, glucose 209, bun 26 and cr 1,36  BNP 57.6 High sensitive troponin 10 Wbc 1.3, hgb 7.7 hvt 21,8 plt 16  Sars covid 9 negative   Chest radiograph with no cardiomegaly, with mild hilar vascular congestion, no infiltrates.   EKG 91 bpm, normal axis, normal intervals, sinus rhythm, with no significant ST segment or T wave changes.   Patient placed on furosemide for diuresis.  Worsening anemia and thrombocytopenia. 03/12 PRBC and pool of platelets transfusion X1

## 2021-03-14 NOTE — Evaluation (Signed)
Physical Therapy Evaluation ?Patient Details ?Name: Timothy Murray ?MRN: [id number] ?DOB: [birth year] ?Today's Date: 03/14/2021 ? ?History of Present Illness ? Pt adm 3/10 with acute on chronic diastolic heart failure. PMH - CAD, CABG, DM, peripheral neuropathy, chronic back pain, myelodysplastic syndrome. Liver CA.  ?Clinical Impression ? Pt presents to PT with decreased mobility due to decreased strength, decreased balance, and decreased functional activity tolerance. Pt has good support at home. Recommend HHPT at DC.  ?   ?   ? ?Recommendations for follow up therapy are one component of a multi-disciplinary discharge planning process, led by the attending physician.  Recommendations may be updated based on patient status, additional functional criteria and insurance authorization. ? ?Follow Up Recommendations Home health PT ? ?  ?Assistance Recommended at Discharge Intermittent Supervision/Assistance  ?Patient can return home with the following ?   ? ?  ?Equipment Recommendations Rollator (4 wheels)  ?Recommendations for Other Services ?    ?  ?Functional Status Assessment Patient has had a recent decline in their functional status and demonstrates the ability to make significant improvements in function in a reasonable and predictable amount of time.  ? ?  ?Precautions / Restrictions Precautions ?Precautions: Fall  ? ?  ? ?Mobility ? Bed Mobility ?Overal bed mobility: Needs Assistance ?Bed Mobility: Supine to Sit ?  ?  ?Supine to sit: Min assist ?  ?  ?General bed mobility comments: Assist to elevate trunk into sitting ?  ? ?Transfers ?Overall transfer level: Needs assistance ?Equipment used: Rolling walker (2 wheels) ?Transfers: Sit to/from Stand ?Sit to Stand: Min guard, From elevated surface ?  ?  ?  ?  ?  ?General transfer comment: Incr time and effort to rise and assist for safety ?  ? ?Ambulation/Gait ?Ambulation/Gait assistance: Min guard ?Gait Distance (Feet): 75 Feet (x 2) ?Assistive device: Rolling walker  (2 wheels) ?Gait Pattern/deviations: Step-through pattern, Decreased stride length ?Gait velocity: decr ?Gait velocity interpretation: <1.31 ft/sec, indicative of household ambulator ?  ?General Gait Details: Assist for safety. Fatigues quickly and required seated rest break ? ?Stairs ?  ?  ?  ?  ?  ? ?Wheelchair Mobility ?  ? ?Modified Rankin (Stroke Patients Only) ?  ? ?  ? ?Balance Overall balance assessment: Needs assistance ?Sitting-balance support: No upper extremity supported, Feet supported ?Sitting balance-Leahy Scale: Fair ?  ?  ?Standing balance support: No upper extremity supported, During functional activity ?Standing balance-Leahy Scale: Fair ?  ?  ?  ?  ?  ?  ?  ?  ?  ?  ?  ?  ?   ? ? ? ?Pertinent Vitals/Pain Pain Assessment ?Pain Assessment: 0-10 ?Pain Score: 8  ?Pain Location: back and hips ?Pain Descriptors / Indicators: Grimacing, Aching ?Pain Intervention(s): Monitored during session, Repositioned  ? ? ?Home Living Family/patient expects to be discharged to:: Private residence ?Living Arrangements: Spouse/significant other ?Available Help at Discharge: Family;Available 24 hours/day ?Type of Home: House ?Home Access: Level entry ?  ?  ?  ?Home Layout: One level ?Home Equipment: Shower seat ?   ?  ?Prior Function Prior Level of Function : Independent/Modified Independent ?  ?  ?  ?  ?  ?  ?Mobility Comments: No assistive device for amb ?  ?  ? ? ?Hand Dominance  ?   ? ?  ?Extremity/Trunk Assessment  ? Upper Extremity Assessment ?Upper Extremity Assessment: Overall WFL for tasks assessed ?  ? ?Lower Extremity Assessment ?Lower Extremity Assessment: Generalized weakness ?  ? ?   ?  Communication  ? Communication: No difficulties  ?Cognition Arousal/Alertness: Awake/alert ?Behavior During Therapy: [facility] for tasks assessed/performed ?Overall Cognitive Status: Within Functional Limits for tasks assessed ?  ?  ?  ?  ?  ?  ?  ?  ?  ?  ?  ?  ?  ?  ?  ?  ?  ?  ?  ? ?  ?General Comments General comments (skin  integrity, edema, etc.): HR 110's with activity ? ?  ?Exercises    ? ?Assessment/Plan  ?  ?PT Assessment Patient needs continued PT services  ?PT Problem List Decreased strength;Decreased activity tolerance;Decreased balance;Decreased mobility ? ?   ?  ?PT Treatment Interventions DME instruction;Gait training;Functional mobility training;Therapeutic activities;Therapeutic exercise;Balance training;Patient/family education   ? ?PT Goals (Current goals can be found in the Care Plan section)  ?Acute Rehab PT Goals ?Patient Stated Goal: get stronger ?PT Goal Formulation: With patient ?Time For Goal Achievement: 03/28/21 ?Potential to Achieve Goals: Good ? ?  ?Frequency Min 3X/week ?  ? ? ?Co-evaluation   ?  ?  ?  ?  ? ? ?  ?AM-PAC PT "6 Clicks" Mobility  ?Outcome Measure Help needed turning from your back to your side while in a flat bed without using bedrails?: None ?Help needed moving from lying on your back to sitting on the side of a flat bed without using bedrails?: A Little ?Help needed moving to and from a bed to a chair (including a wheelchair)?: A Little ?Help needed standing up from a chair using your arms (e.g., wheelchair or bedside chair)?: A Little ?Help needed to walk in hospital room?: A Little ?Help needed climbing 3-5 steps with a railing? : A Little ?6 Click Score: 19 ? ?  ?End of Session   ?Activity Tolerance: Patient limited by fatigue ?Patient left: in chair;with call bell/phone within reach;with family/visitor present ?Nurse Communication: Mobility status ?PT Visit Diagnosis: Other abnormalities of gait and mobility (R26.89);Muscle weakness (generalized) (M62.81) ?  ? ?Time: 1441-1501 ?PT Time Calculation (min) (ACUTE ONLY): 20 min ? ? ?Charges:   PT Evaluation ?$PT Eval Moderate Complexity: 1 Mod ?  ?  ?   ? ? ?[facility] PT ?Acute Rehabilitation Services ?Pager [phone] ?Office [phone] ? ? ?Shary Decamp [name] ?03/14/2021, 3:35 PM ? ?

## 2021-03-14 NOTE — Assessment & Plan Note (Addendum)
Myelodysplastic syndrome.   SP one unit PRBC and one pool of platelets transfusion with good toleration. Follow up wbc is 0,8, hgb is 8,5 and Plt 19.   Continue prophylactic acyclovir and levofloxacin  Follow up with hematology as outpatient.  Continue with eltrombapag.

## 2021-03-14 NOTE — Assessment & Plan Note (Addendum)
No clinical signs of liver failure.  ?Continue with lactulose.  ?

## 2021-03-14 NOTE — Assessment & Plan Note (Addendum)
Hyponatremia/ hypokalemia.  Improved volume status. Rena function with serum cr at 1,42 with K at 3,1 and serum bicarbonate at 30.  Plan to transition to oral furosemide in am Follow up on renal function in am. Keep Hgb above 8.  Continue to hold on ARB.

## 2021-03-14 NOTE — Progress Notes (Signed)
?Progress Note ? ? ?PatientNitish Murray IWP:[id number] DOB: 1945/09/11 DOA: 03/13/2021     1 ?DOS: the patient was seen and examined on 03/14/2021 ?  ?Brief hospital course: ?Timothy Murray was admitted to the hospital with the working diagnosis of decompensated heart failure.  ? ?76 yo male with the past medical history of myelodysplastic syndrome, chronic thrombocytopenia, cirrhosis, T2DM, and hypertension who presented with chest pain and lower extremity edema. Reported 7 days of worsening lower extremity edema and dyspnea on exertion. On his initial physical examination his blood pressure was 158/76, HR 105. RR 29, 02 saturation 100% on supplemental 02 per [place], lungs with bilateral rales and increased work of breathing, heart with S1 and S2 present and tachycardic with no gallops or murmurs, abdomen soft and positive ++ lower extremity edema.  ? ?Na 132, K  4,0 CL 98, bicarbonate 23, glucose 209, bun 26 and cr 1,36  ?BNP 57.6 ?High sensitive troponin 10 ?Wbc 1.3, hgb 7.7 hvt 21,8 plt 16  ?Sars covid 9 negative  ? ?Chest radiograph with no cardiomegaly, with mild hilar vascular congestion, no infiltrates.  ? ?EKG 91 bpm, normal axis, normal intervals, sinus rhythm, with no significant ST segment or T wave changes.  ? ? ? ?Assessment and Plan: ?* Acute on chronic diastolic CHF (congestive heart failure) ([place]) ?Patient continue to be volume overloaded.  ?Urine output not documented, but has significant lower extremity edema.  ? ?Blood pressure from 150 to 115 mmHg. ?Echocardiogram with LV EF >75%, preserved RV systolic function, no significant valvular disease.  ? ?Plan to continue diuresis with furosemide, will increase dose to 60 mg IV q12  ?Continue blood pressure monitor. ?Patient may need PRBC transfusion tomorrow.  ? ?AKI (acute kidney injury) ([place]) ?Hyponatremia.  ?Renal function with serum cr at 1,39 with K at 3,7 and NA at 133. ? ?Plan to continue aggressive diuresis with furosemide, patient with significant  edema lower extremities  ? ?HTN (hypertension) ?Continue blood pressure monitoring, currently on aggressive diuresis for volume overload.  ? ?Type 2 diabetes mellitus with hyperlipidemia ([place]) ?Fasting glucose this am 194. ?Plan to continue insulin sliding scale for glucose cover and monitoring. ?Patient with poor oral intake.  ? ?CAD (coronary artery disease), native coronary artery ?No chest pain today, no wall motion abnormalities on echocardiogram. ?Continue diuresis. ?Not on antiplatelet therapy due to thrombocytopenia.  ? ?Hepatocellular carcinoma ([place]) ?No clinical signs of liver failure.  ? ?Pancytopenia ([place]) ?Myelodysplastic syndrome.  ?Wbc 1,0 hgb 7,2 and plt 11 ?Continue close follow up of hgb and hct, PLT ?Plan for possible prbc transfusion tomorrow, when patient more euvolemic.  ?If Plt less than 10, will order platelet transfusion  ? ? ? ? ?  ? ?Subjective: patient feeling very weak and deconditioned, mild improvement in dyspnea but not back to baseline. Positive back pain and bilateral legs pain  ? ?Physical Exam: ?Vitals:  ? 03/13/21 2353 03/14/21 0318 03/14/21 0743 03/14/21 1152  ?BP: (!) 145/65 (!) 131/52 (!) 123/97 (!) 115/50  ?Pulse: 84 (!) 104 (!) 103 91  ?Resp: '20 19 20 19  '$ ?Temp: 98.4 ?F (36.9 ?C) 98.8 ?F (37.1 ?C) 98.5 ?F (36.9 ?C) 97.8 ?F (36.6 ?C)  ?TempSrc: Oral Oral Oral Oral  ?SpO2: 95% 97% 98% 97%  ?Weight:      ?Height:      ? ?Neurology awake and alert ?ENT with positive pallor ?Cardiovascular with S1 and S2 present and rhythmic, with no gallops or rubs, positive systolic murmur at the apex. ?  Positive JVD  ?Positive lower extremity edema +++ bilaterally ?Abdomen soft ? ?Data Reviewed: ? ? ? ?Family Communication: I spoke with patient's wife at the bedside, we talked in detail about patient's condition, plan of care and prognosis and all questions were addressed. ? ? ? ?Disposition: ?Status is: Inpatient ?Remains inpatient appropriate because: heart failure  ? Planned Discharge  Destination: Home ? ?Author: ?Tawni Millers, MD ?03/14/2021 2:45 PM ? ?For on call review [email or web address]  ?

## 2021-03-14 NOTE — Progress Notes (Signed)
VASCULAR LAB ? ? ? ?Bilateral lower extremity venous duplex has been performed. ? ?See CV proc for preliminary results. ? ? ?Galaxy Borden, RVT ?03/14/2021, 3:42 PM ? ?

## 2021-03-14 NOTE — Assessment & Plan Note (Addendum)
Fasting glucose this am 159 Plan to continue insulin sliding scale for glucose cover and monitoring.   Continue with statin therapy.

## 2021-03-14 NOTE — Assessment & Plan Note (Addendum)
No chest pain today, no wall motion abnormalities on echocardiogram. ? ?Continue blood pressure control. ?No antiplatelet therapy due to thrombocytopenia.  ?

## 2021-03-14 NOTE — Assessment & Plan Note (Addendum)
Patient was admitted to the cardiac ward and underwent aggressive diuresis with IV furosemide, negative fluid balance was achieved, -2.974 ml, with significant improvement in his symptoms.   Echocardiogram with LV EF >75%, preserved RV systolic function, no significant valvular disease.  Likely high output heart failure due to anemia.   Patient tolerate well PRBC transfusion with improvement in his anemia. Plan to continue diuresis with furosemide and follow up as outpatient. Continue with metoprolol. Holding on RAS inhibition due to risk of worsening acute renal failure.

## 2021-03-14 NOTE — Assessment & Plan Note (Addendum)
Resume metoprolol and continue blood pressure monitoring. Transition to oral furosemide in am.

## 2021-03-14 NOTE — Progress Notes (Signed)
Echocardiogram ?2D Echocardiogram has been performed. ? ?Timothy Murray ?03/14/2021, 12:32 PM ?

## 2021-03-15 ENCOUNTER — Ambulatory Visit: Payer: Medicare HMO

## 2021-03-15 DIAGNOSIS — I2510 Atherosclerotic heart disease of native coronary artery without angina pectoris: Secondary | ICD-10-CM | POA: Diagnosis not present

## 2021-03-15 DIAGNOSIS — N179 Acute kidney failure, unspecified: Secondary | ICD-10-CM | POA: Diagnosis not present

## 2021-03-15 DIAGNOSIS — I10 Essential (primary) hypertension: Secondary | ICD-10-CM | POA: Diagnosis not present

## 2021-03-15 DIAGNOSIS — I5033 Acute on chronic diastolic (congestive) heart failure: Secondary | ICD-10-CM | POA: Diagnosis not present

## 2021-03-15 LAB — BASIC METABOLIC PANEL
Anion gap: 9 (ref 5–15)
BUN: 27 mg/dL — ABNORMAL HIGH (ref 8–23)
CO2: 28 mmol/L (ref 22–32)
Calcium: 8.4 mg/dL — ABNORMAL LOW (ref 8.9–10.3)
Chloride: 100 mmol/L (ref 98–111)
Creatinine, Ser: 1.32 mg/dL — ABNORMAL HIGH (ref 0.61–1.24)
GFR, Estimated: 56 mL/min — ABNORMAL LOW (ref >60)
Glucose, Bld: 130 mg/dL — ABNORMAL HIGH (ref 70–99)
Potassium: 3.9 mmol/L (ref 3.5–5.1)
Sodium: 137 mmol/L (ref 135–145)

## 2021-03-15 LAB — CBC
HCT: 18.6 % — ABNORMAL LOW (ref 39.0–52.0)
Hemoglobin: 6.9 g/dL — CL (ref 13.0–17.0)
MCH: 36.1 pg — ABNORMAL HIGH (ref 26.0–34.0)
MCHC: 37.1 g/dL — ABNORMAL HIGH (ref 30.0–36.0)
MCV: 97.4 fL (ref 80.0–100.0)
Platelets: 10 10*3/uL — CL (ref 150–400)
RBC: 1.91 MIL/uL — ABNORMAL LOW (ref 4.22–5.81)
RDW: 15.3 % (ref 11.5–15.5)
WBC: 1.2 10*3/uL — CL (ref 4.0–10.5)
nRBC: 0 % (ref 0.0–0.2)

## 2021-03-15 LAB — GLUCOSE, CAPILLARY
Glucose-Capillary: 112 mg/dL — ABNORMAL HIGH (ref 70–99)
Glucose-Capillary: 165 mg/dL — ABNORMAL HIGH (ref 70–99)
Glucose-Capillary: 294 mg/dL — ABNORMAL HIGH (ref 70–99)
Glucose-Capillary: 167 mg/dL — ABNORMAL HIGH (ref 70–99)

## 2021-03-15 LAB — PREPARE RBC (CROSSMATCH)

## 2021-03-15 LAB — MAGNESIUM: Magnesium: 2.3 mg/dL (ref 1.7–2.4)

## 2021-03-15 MED ORDER — SODIUM CHLORIDE 0.9% IV SOLUTION: Freq: Once | INTRAVENOUS | Status: AC

## 2021-03-15 MED ORDER — FUROSEMIDE 10 MG/ML IJ SOLN
60.0000 mg | Freq: Once | INTRAMUSCULAR | Status: AC
  Administered 2021-03-15: 60 mg via INTRAVENOUS
  Filled 2021-03-15: qty 6

## 2021-03-15 NOTE — Progress Notes (Signed)
?Progress Note ? ? ?PatientDevery Murray IHK:[id number] DOB: 11-17-1945 DOA: 03/13/2021     2 ?DOS: the patient was seen and examined on 03/15/2021 ?  ?Brief hospital course: ?[name] was admitted to the hospital with the working diagnosis of decompensated heart failure.  ? ?76 yo male with the past medical history of myelodysplastic syndrome, chronic thrombocytopenia, cirrhosis, T2DM, and hypertension who presented with chest pain and lower extremity edema. Reported 7 days of worsening lower extremity edema and dyspnea on exertion. On his initial physical examination his blood pressure was 158/76, HR 105. RR 29, 02 saturation 100% on supplemental 02 per [place], lungs with bilateral rales and increased work of breathing, heart with S1 and S2 present and tachycardic with no gallops or murmurs, abdomen soft and positive ++ lower extremity edema.  ? ?Na 132, K  4,0 CL 98, bicarbonate 23, glucose 209, bun 26 and cr 1,36  ?BNP 57.6 ?High sensitive troponin 10 ?Wbc 1.3, hgb 7.7 hvt 21,8 plt 16  ?Sars covid 9 negative  ? ?Chest radiograph with no cardiomegaly, with mild hilar vascular congestion, no infiltrates.  ? ?EKG 91 bpm, normal axis, normal intervals, sinus rhythm, with no significant ST segment or T wave changes.  ? ?Patient placed on furosemide for diuresis. ? ?Worsening anemia and thrombocytopenia. ?03/12 PRBC and pool of platelets transfusion X1  ? ?Assessment and Plan: ?* Acute on chronic diastolic CHF (congestive heart failure) ([place]) ?Echocardiogram with LV EF >75%, preserved RV systolic function, no significant valvular disease.  ? ?Improved volume status but continue hypervolemic. ? ?Urine output  2,200 ml. ?Systolic blood pressure 756 to 150 mmHg ?Warm lower extremities.  ? ?Continue diuresis with furosemide. ?Plan to correct anemia with PRBC, will need extra dose of furosemide after PRBC transfusion. ?Continue close monitoring of blood pressure.  ?Out of bed to chair tid with meals.  ? ?AKI (acute kidney  injury) ([place]) ?Hyponatremia.  ?Renal function with serum cr at 1,32 with K at 3,9 and serum bicarbonate at 28.  ? ?Continue aggressive diuresis with furosemide and follow up renal function in am. ?Avoid hypotension and nephrotoxic medications.  ? ? ?HTN (hypertension) ?Blood pressure has been stable, continue diuresis with furosemide.  ? ?Type 2 diabetes mellitus with hyperlipidemia ([place]) ?Fasting glucose this am 130 ?Plan to continue insulin sliding scale for glucose cover and monitoring. ?Patient with poor oral intake.  ?Consult nutrition for evaluation.  ? ?CAD (coronary artery disease), native coronary artery ?No chest pain today, no wall motion abnormalities on echocardiogram. ?Continue diuresis. ?Not on antiplatelet therapy due to thrombocytopenia.  ? ?Hepatocellular carcinoma ([place]) ?No clinical signs of liver failure.  ? ?Pancytopenia ([place]) ?Myelodysplastic syndrome.  ? ?Patient with critical hgb down to 6,8 and Plt at 10. ?Plan to transfuse PRBC and poor of platelets today, follow up cell count in am,  ?Wbc is 1.2  ? ? ? ? ?  ? ?Subjective: Patient with improved dyspnea and edema but no back to baseline, continue to have lower extremity pain, ? ?Physical Exam: ?Vitals:  ? 03/15/21 0530 03/15/21 0733 03/15/21 0954 03/15/21 1009  ?BP: (!) 156/62 (!) 128/53  (!) 153/52  ?Pulse:  91 88 94  ?Resp: '19 20  20  '$ ?Temp: 98 ?F (36.7 ?C) 98.9 ?F (37.2 ?C) 98.8 ?F (37.1 ?C) 98.7 ?F (37.1 ?C)  ?TempSrc: Oral Oral Oral   ?SpO2: 93% 95%    ?Weight: 85.5 kg     ?Height:      ? ?Neurology awake and alert ?  ENT with pallor ?Cardiovascular with S1 and S2 present and rhythmic with no gallops or murmurs, no rubs ?No JVD ?Positive lower extremity edema +++ pitting bilaterally ?Respiratory with no wheezing or rales ?Abdomen soft ?Skin positive for petechiae.  ?Data Reviewed: ? ? ? ?Family Communication: I spoke with patient's wife at the bedside, we talked in detail about patient's condition, plan of care and prognosis and all  questions were addressed. ? ? ?Disposition: ?Status is: Inpatient ?Remains inpatient appropriate because: heart failure  ? Planned Discharge Destination: Home ? ?Author: ?Tawni Millers, MD ?03/15/2021 10:37 AM ? ?For on call review [email or web address]  ?

## 2021-03-15 NOTE — Progress Notes (Signed)
HOSPITAL MEDICINE OVERNIGHT EVENT NOTE   ? ?Notified by nursing that patient's hemoglobin this morning is 6.9, down from 7.2 yesterday.  Platelet count has additionally continued to slowly decrease and is currently at 10. ? ?Patient has a known history of pancytopenia due to known myelodysplastic syndrome. ? ?Patient is exhibiting no clinical evidence of bleeding.  Patient is not having any chest pain.  Patient is hemodynamically stable. ? ?We will order 1 unit packed red blood cells as well as 1 pack of platelets to be transfused this morning. ? ?Vernelle Emerald  MD ?[facility]  ? ? ? ? ? ? ? ? ? ? ?

## 2021-03-15 NOTE — Evaluation (Addendum)
Occupational Therapy Evaluation Patient Details Name: Timothy Murray MRN: [id number] DOB: July 05, 1945 Today's Date: 03/15/2021   History of Present Illness Pt adm 3/10 with acute on chronic diastolic heart failure. PMH - CAD, CABG, DM, peripheral neuropathy, chronic back pain, myelodysplastic syndrome. Liver CA.   Clinical Impression   Pt was evaluated s/p the above admission list. She reports being indep at baseline, no AD, including IADLs. He has good family support for d/c. Upon evaluation pt was min A for functional mobility and up to mod A for LB ADLs. He is limited by chronic back pain and also reports bilat heel pain and hip pain. Session limited to bed>chair transfer with RW, hands on assist and significantly incr time for pain management. Pt encouraged and agreeable to sit in chair for lunch. RN notified of pain, and request of pain medication. Pt's wife inquired about wc for d/c, educated on therapy recommendation for RW to encourage mobility and strength. Pt will benefit from OT acutely. Recommend d/c home without follow up OT, anticipate pt progress well once pain is managed.      Recommendations for follow up therapy are one component of a multi-disciplinary discharge planning process, led by the attending physician.  Recommendations may be updated based on patient status, additional functional criteria and insurance authorization.   Follow Up Recommendations  No OT follow up    Assistance Recommended at Discharge Intermittent Supervision/Assistance  Patient can return home with the following A little help with walking and/or transfers;A little help with bathing/dressing/bathroom;Assist for transportation;Help with stairs or ramp for entrance    Functional Status Assessment  Patient has had a recent decline in their functional status and demonstrates the ability to make significant improvements in function in a reasonable and predictable amount of time.  Equipment  Recommendations  BSC/3in1 (RW)    Recommendations for Other Services       Precautions / Restrictions Precautions Precautions: Fall Restrictions Weight Bearing Restrictions: No      Mobility Bed Mobility Overal bed mobility: Needs Assistance Bed Mobility: Supine to Sit     Supine to sit: Min assist     General bed mobility comments: Assist to elevate trunk into sitting    Transfers Overall transfer level: Needs assistance Equipment used: Rolling walker (2 wheels) Transfers: Sit to/from Stand, Bed to chair/wheelchair/BSC Sit to Stand: Min guard, From elevated surface     Step pivot transfers: Min assist     General transfer comment: incr time and effort, limited standing tolerance due to back pain      Balance Overall balance assessment: Needs assistance Sitting-balance support: No upper extremity supported, Feet supported Sitting balance-Leahy Scale: Fair     Standing balance support: Bilateral upper extremity supported, During functional activity Standing balance-Leahy Scale: Poor Standing balance comment: reliant on AD this session                           ADL either performed or assessed with clinical judgement   ADL Overall ADL's : Needs assistance/impaired Eating/Feeding: Independent;Sitting   Grooming: Set up;Sitting Grooming Details (indicate cue type and reason): unable to tolerate standing this date due to back pain Upper Body Bathing: Set up;Sitting   Lower Body Bathing: Minimal assistance;Sit to/from stand   Upper Body Dressing : Set up;Sitting   Lower Body Dressing: Moderate assistance;Sit to/from stand Lower Body Dressing Details (indicate cue type and reason): unable to don socks EOB this date due to back pain Toilet  Transfer: Min guard;Ambulation;Rolling walker (2 wheels)   Toileting- Clothing Manipulation and Hygiene: Supervision/safety;Sitting/lateral lean       Functional mobility during ADLs: Min guard;Rolling walker  (2 wheels) General ADL Comments: limited to EOB and chair transfer due to back pain this date.     Vision Baseline Vision/History: 0 No visual deficits Ability to See in Adequate Light: 0 Adequate Patient Visual Report: No change from baseline Vision Assessment?: No apparent visual deficits     Perception     Praxis      Pertinent Vitals/Pain Pain Assessment Pain Assessment: Faces Faces Pain Scale: Hurts whole lot Pain Location: back and hips and heels Pain Descriptors / Indicators: Grimacing, Aching Pain Intervention(s): Limited activity within patient's tolerance, Monitored during session, Patient requesting pain meds-RN notified     Hand Dominance     Extremity/Trunk Assessment Upper Extremity Assessment Upper Extremity Assessment: Overall WFL for tasks assessed   Lower Extremity Assessment Lower Extremity Assessment: Defer to PT evaluation   Cervical / Trunk Assessment Cervical / Trunk Assessment: Normal   Communication Communication Communication: No difficulties   Cognition Arousal/Alertness: Awake/alert Behavior During Therapy: WFL for tasks assessed/performed Overall Cognitive Status: Within Functional Limits for tasks assessed                                 General Comments: internally distracted by back pain this date     General Comments  VSS on RA, HR to 110 during functional task. Pt's wife asking for a wc, explained that therapy is recommending RW to encourage mobility. Pt with back pain at the end of the session, asking for pain Gel. RN notified. Encouraged pt to sit OOB for at least 1 hour for lucnh.    Exercises     Shoulder Instructions      Home Living Family/patient expects to be discharged to:: Private residence Living Arrangements: Spouse/significant other Available Help at Discharge: Family;Available 24 hours/day Type of Home: House Home Access: Level entry     Home Layout: One level     Bathroom Shower/Tub:  Tub/shower unit         Home Equipment: Shower seat          Prior Functioning/Environment Prior Level of Function : Independent/Modified Independent             Mobility Comments: No assistive device for amb ADLs Comments: indep        OT Problem List: Decreased strength;Decreased range of motion;Decreased activity tolerance;Impaired balance (sitting and/or standing);Decreased safety awareness;Decreased knowledge of use of DME or AE;Decreased knowledge of precautions;Pain      OT Treatment/Interventions: Self-care/ADL training;Therapeutic exercise;Balance training;Patient/family education;Therapeutic activities;DME and/or AE instruction    OT Goals(Current goals can be found in the care plan section) Acute Rehab OT Goals Patient Stated Goal: less pain OT Goal Formulation: With patient Time For Goal Achievement: 03/29/21 Potential to Achieve Goals: Good ADL Goals Pt Will Perform Lower Body Dressing: with modified independence;sit to/from stand Pt Will Transfer to Toilet: with modified independence;ambulating Additional ADL Goal #1: pt will demonstrate increased activity tolerance to perform at least 3 grooming tasks in standing independently  OT Frequency: Min 2X/week    Co-evaluation              AM-PAC OT "6 Clicks" Daily Activity     Outcome Measure Help from another person eating meals?: None Help from another person taking care of personal grooming?: A  Little Help from another person toileting, which includes using toliet, bedpan, or urinal?: A Little Help from another person bathing (including washing, rinsing, drying)?: A Little Help from another person to put on and taking off regular upper body clothing?: None Help from another person to put on and taking off regular lower body clothing?: A Little 6 Click Score: 20   End of Session Equipment Utilized During Treatment: Rolling walker (2 wheels) Nurse Communication: Mobility status;Patient requests  pain meds  Activity Tolerance: Patient limited by pain Patient left: in chair;with call bell/phone within reach;with chair alarm set (RN notified of pt's pain)  OT Visit Diagnosis: Unsteadiness on feet (R26.81);Other abnormalities of gait and mobility (R26.89);Pain                Time: 1030-1314 OT Time Calculation (min): 27 min Charges:  OT General Charges $OT Visit: 1 Visit OT Evaluation $OT Eval Moderate Complexity: 1 Mod OT Treatments $Therapeutic Activity: 8-22 mins   Bobby Barton A Jeny Nield 03/15/2021, 1:44 PM

## 2021-03-15 NOTE — Progress Notes (Signed)
?  03/15/21 1030  ?Clinical Encounter Type  ?Visited With Patient and family together  ?Visit Type Initial;Spiritual support  ?Referral From Nurse ?(Nurse Sam > Patient Requested Chaplain)  ?Consult/Referral To Chaplain  ?Spiritual Encounters  ?Spiritual Needs Emotional;Prayer  ? ?Met with Timothy Murray and his wife and patient's bedside. Patient repented and sought forgiveness of transgression and sin and sought forgiveness. At patient's request we prayed for healing, mercy, and grace. [address] [place] [phone].  ?

## 2021-03-15 NOTE — Progress Notes (Addendum)
Mobility Specialist Progress Note ? ?MOBILITY HOLD: Pt inappropriate for mobility specialist at this time d/t pt receiving RBC throughout morning and afternoon. Precautions advised by RN team. Mobility specialist to hold today, will continue to follow for readiness. ? ?Holland Falling ?Mobility Specialist ?Phone Number [phone] ? ?

## 2021-03-15 NOTE — Progress Notes (Signed)
CRITICAL VALUE STICKER ? ?CRITICAL VALUE: HGB= 6.9 ? ?RECEIVER (on-site recipient of call): Manuela Schwartz RN ?  ?DATE & TIME NOTIFIED: 03/15/2021 0410 ? ?MESSENGER (representative from lab):Phong Alfonse Spruce ? ?MD NOTIFIED: Dr. Cyd Silence ? ?TIME OF NOTIFICATION: 4707. Page at 0430 was unsuccessful.  ? ?RESPONSE:  New orders placed in [facility] to be carried out. ?

## 2021-03-16 ENCOUNTER — Inpatient Hospital Stay: Payer: Medicare HMO

## 2021-03-16 DIAGNOSIS — I5033 Acute on chronic diastolic (congestive) heart failure: Secondary | ICD-10-CM | POA: Diagnosis not present

## 2021-03-16 DIAGNOSIS — N179 Acute kidney failure, unspecified: Secondary | ICD-10-CM | POA: Diagnosis not present

## 2021-03-16 DIAGNOSIS — I10 Essential (primary) hypertension: Secondary | ICD-10-CM | POA: Diagnosis not present

## 2021-03-16 DIAGNOSIS — E1169 Type 2 diabetes mellitus with other specified complication: Secondary | ICD-10-CM | POA: Diagnosis not present

## 2021-03-16 LAB — HEMOGLOBIN A1C
Hgb A1c MFr Bld: 7.4 % — ABNORMAL HIGH (ref 4.8–5.6)
Mean Plasma Glucose: 166 mg/dL

## 2021-03-16 LAB — GLUCOSE, CAPILLARY
Glucose-Capillary: 144 mg/dL — ABNORMAL HIGH (ref 70–99)
Glucose-Capillary: 220 mg/dL — ABNORMAL HIGH (ref 70–99)
Glucose-Capillary: 173 mg/dL — ABNORMAL HIGH (ref 70–99)
Glucose-Capillary: 175 mg/dL — ABNORMAL HIGH (ref 70–99)

## 2021-03-16 LAB — CBC
HCT: 23.7 % — ABNORMAL LOW (ref 39.0–52.0)
Hemoglobin: 8.5 g/dL — ABNORMAL LOW (ref 13.0–17.0)
MCH: 33.3 pg (ref 26.0–34.0)
MCHC: 35.9 g/dL (ref 30.0–36.0)
MCV: 92.9 fL (ref 80.0–100.0)
Platelets: 19 10*3/uL — CL (ref 150–400)
RBC: 2.55 MIL/uL — ABNORMAL LOW (ref 4.22–5.81)
RDW: 16.7 % — ABNORMAL HIGH (ref 11.5–15.5)
WBC: 0.8 10*3/uL — CL (ref 4.0–10.5)
nRBC: 0 % (ref 0.0–0.2)

## 2021-03-16 LAB — BASIC METABOLIC PANEL
Anion gap: 9 (ref 5–15)
BUN: 26 mg/dL — ABNORMAL HIGH (ref 8–23)
CO2: 30 mmol/L (ref 22–32)
Calcium: 8.4 mg/dL — ABNORMAL LOW (ref 8.9–10.3)
Chloride: 99 mmol/L (ref 98–111)
Creatinine, Ser: 1.41 mg/dL — ABNORMAL HIGH (ref 0.61–1.24)
GFR, Estimated: 52 mL/min — ABNORMAL LOW (ref >60)
Glucose, Bld: 159 mg/dL — ABNORMAL HIGH (ref 70–99)
Potassium: 3.1 mmol/L — ABNORMAL LOW (ref 3.5–5.1)
Sodium: 138 mmol/L (ref 135–145)

## 2021-03-16 LAB — TYPE AND SCREEN
ABO/RH(D): A POS
Antibody Screen: NEGATIVE
Unit division: 0

## 2021-03-16 LAB — BPAM RBC
Blood Product Expiration Date: 202303302359
ISSUE DATE / TIME: 202303121405
Unit Type and Rh: 6200

## 2021-03-16 LAB — BPAM PLATELET PHERESIS
Blood Product Expiration Date: 202303122359
ISSUE DATE / TIME: 202303120944
Unit Type and Rh: 6200

## 2021-03-16 LAB — PREPARE PLATELET PHERESIS: Unit division: 0

## 2021-03-16 MED ORDER — FUROSEMIDE 40 MG PO TABS
40.0000 mg | ORAL_TABLET | Freq: Every day | ORAL | Status: DC
  Administered 2021-03-17: 40 mg via ORAL
  Filled 2021-03-16: qty 1

## 2021-03-16 MED ORDER — POTASSIUM CHLORIDE CRYS ER 20 MEQ PO TBCR
40.0000 meq | EXTENDED_RELEASE_TABLET | ORAL | Status: AC
  Administered 2021-03-16 (×2): 40 meq via ORAL
  Filled 2021-03-16 (×2): qty 2

## 2021-03-16 MED ORDER — METOPROLOL TARTRATE 25 MG PO TABS
25.0000 mg | ORAL_TABLET | Freq: Two times a day (BID) | ORAL | Status: DC
  Administered 2021-03-16 – 2021-03-17 (×3): 25 mg via ORAL
  Filled 2021-03-16 (×3): qty 1

## 2021-03-16 NOTE — Progress Notes (Signed)
Physical Therapy Treatment ?Patient Details ?Name: Timothy Murray ?MRN: [id number] ?DOB: 1945-07-21 ?Today's Date: 03/16/2021 ? ? ?History of Present Illness Pt adm 3/10 with acute on chronic diastolic heart failure. PMH - CAD, CABG, DM, peripheral neuropathy, chronic back pain, myelodysplastic syndrome. Liver CA. ? ? ?  ?PT Comments  ? ? The pt was agreeable to session after premedication, but does present with more limited mobility and endurance this session compared to initial evaluation on 3/11 where he ambulated 75 ft x2. The pt reports significant pain in bilateral knees, and was encouraged to complete seated exercises and gentle ROM throughout the day to increase activity as well as short bouts of ambulation with staff. Will continue to benefit from skilled PT acutely to progress endurance and stability, but given limitations to ~30 ft mobility at this time, I have updated DME recommendation. Pt and his wife in agreement with plan, hopeful to return home with HHPT.  ?  ?Recommendations for follow up therapy are one component of a multi-disciplinary discharge planning process, led by the attending physician.  Recommendations may be updated based on patient status, additional functional criteria and insurance authorization. ? ?Follow Up Recommendations ? Home health PT ?  ?  ?Assistance Recommended at Discharge Intermittent Supervision/Assistance  ?Patient can return home with the following A little help with walking and/or transfers;A little help with bathing/dressing/bathroom;Assist for transportation;Help with stairs or ramp for entrance ?  ?Equipment Recommendations ? Rollator (4 wheels);BSC/3in1;Wheelchair (measurements PT);Wheelchair cushion (measurements PT)  ?  ?Recommendations for Other Services   ? ? ?  ?Precautions / Restrictions Precautions ?Precautions: Fall ?Restrictions ?Weight Bearing Restrictions: No  ?  ? ?Mobility ? Bed Mobility ?Overal bed mobility: Needs Assistance ?Bed Mobility: Rolling,  Sidelying to Sit ?Rolling: Min guard ?Sidelying to sit: Min guard ?  ?  ?  ?General bed mobility comments: minG for log roll (due to back pain) ?  ? ?Transfers ?Overall transfer level: Needs assistance ?Equipment used: Rolling walker (2 wheels) ?Transfers: Sit to/from Stand ?Sit to Stand: Min guard ?  ?  ?  ?  ?  ?General transfer comment: minG to power up, cues for hand placement ?  ? ?Ambulation/Gait ?Ambulation/Gait assistance: Min guard ?Gait Distance (Feet): 35 Feet ?Assistive device: Rolling walker (2 wheels) ?Gait Pattern/deviations: Step-through pattern, Decreased stride length ?Gait velocity: decr ?Gait velocity interpretation: <1.31 ft/sec, indicative of household ambulator ?  ?General Gait Details: pt needing minG to minA to steady with short bout of gait in the room, reports limited due to pain in BLE not fatigue. asked for 10 min rest break then declined further mobility due to pain in BLE ? ? ? ?  ?Balance Overall balance assessment: Needs assistance ?Sitting-balance support: No upper extremity supported, Feet supported ?Sitting balance-Leahy Scale: Fair ?  ?  ?Standing balance support: Bilateral upper extremity supported, During functional activity ?Standing balance-Leahy Scale: Poor ?Standing balance comment: reliant on AD this session ?  ?  ?  ?  ?  ?  ?  ?  ?  ?  ?  ?  ? ?  ?Cognition Arousal/Alertness: Awake/alert ?Behavior During Therapy: [facility] for tasks assessed/performed ?Overall Cognitive Status: Within Functional Limits for tasks assessed ?  ?  ?  ?  ?  ?  ?  ?  ?  ?  ?  ?  ?  ?  ?  ?  ?General Comments: able to follow all cues and express needs. ?  ?  ? ?  ?Exercises General Exercises -  Lower Extremity ?Long Arc Quad: AROM, Both, 10 reps, Seated ?Hip Flexion/Marching: AROM, Both, 10 reps, Seated ?Heel Raises: AROM, Both, 10 reps, Seated ? ?  ?General Comments General comments (skin integrity, edema, etc.): HR 100 SpO2 low of 91% on RA with gait ?  ?  ? ?Pertinent Vitals/Pain Pain  Assessment ?Pain Assessment: 0-10 ?Pain Score: 5  ?Pain Location: low back and bilateral knees ?Pain Descriptors / Indicators: Grimacing, Aching ?Pain Intervention(s): Limited activity within patient's tolerance, Monitored during session, Repositioned, Premedicated before session  ? ? ? ?PT Goals (current goals can now be found in the care plan section) Acute Rehab PT Goals ?Patient Stated Goal: get stronger ?PT Goal Formulation: With patient ?Time For Goal Achievement: 03/28/21 ?Potential to Achieve Goals: Good ?Progress towards PT goals: Progressing toward goals ? ?  ?Frequency ? ? ? Min 3X/week ? ? ? ?  ?PT Plan Current plan remains appropriate  ? ? ?   ?AM-PAC PT "6 Clicks" Mobility   ?Outcome Measure ? Help needed turning from your back to your side while in a flat bed without using bedrails?: None ?Help needed moving from lying on your back to sitting on the side of a flat bed without using bedrails?: A Little ?Help needed moving to and from a bed to a chair (including a wheelchair)?: A Little ?Help needed standing up from a chair using your arms (e.g., wheelchair or bedside chair)?: A Little ?Help needed to walk in hospital room?: A Little ?Help needed climbing 3-5 steps with a railing? : A Little ?6 Click Score: 19 ? ?  ?End of Session Equipment Utilized During Treatment: Gait belt ?Activity Tolerance: Patient limited by pain ?Patient left: in chair;with call bell/phone within reach;with family/visitor present ?Nurse Communication: Mobility status ?PT Visit Diagnosis: Other abnormalities of gait and mobility (R26.89);Muscle weakness (generalized) (M62.81) ?  ? ? ?Time: 8115-7262 ?PT Time Calculation (min) (ACUTE ONLY): 37 min ? ?Charges:  $Therapeutic Exercise: 8-22 mins ?$Therapeutic Activity: 8-22 mins          ?          ? ?West Carbo, PT, DPT  ? ?Acute Rehabilitation Department ?Pager #: [phone] - [phone] ? ? ?Sandra Cockayne ?03/16/2021, 5:43 PM ? ?

## 2021-03-16 NOTE — Progress Notes (Signed)
Heart Failure Navigator Progress Note ? ?Assessed for Heart & Vascular TOC clinic readiness.  ?Patient does not meet criteria due to Hospitalization more related to Oncology issues. ?Patient EF >75%.  ? ?  ? ?Earnestine Leys, BSN, RN ?Heart Failure Nurse Navigator ?[phone]   ?

## 2021-03-16 NOTE — TOC Initial Note (Addendum)
Transition of Care (TOC) - Initial/Assessment Note  ? ? ?Patient Details  ?Name: Timothy Murray ?MRN: [id number] ?Date of Birth: [birth year] ? ?Transition of Care (TOC) CM/SW Contact:    ?Coralee Pesa, LCSWA ?Phone Number: ?03/16/2021, 4:17 PM ? ?Clinical Narrative:                 ?CSW met with pt and spouse at bedside, plan is for pt to return home w/ hh. Family requesting [facility] and referral made. Pt is recommended for a walker and is also requesting a 3n 1 and wheelchair, MD and PT aware and will screen and put in orders. DME still needs to be ordered. Daughter will transport at discharge, will need to let her know so she can schedule with work. TOC will continue to follow for DC needs. ? ?Expected Discharge Plan: [place] ?Barriers to Discharge: Continued Medical Work up, Equipment Delay ? ? ?Patient Goals and CMS Choice ?Patient states their goals for this hospitalization and ongoing recovery are:: Pt wants to be able to go back home more independently. ?CMS Medicare.gov Compare Post Acute Care list provided to:: Patient ?Choice offered to / list presented to : Patient ? ?Expected Discharge Plan and Services ?Expected Discharge Plan: [place] ?  ?  ?Post Acute Care Choice: Home Health ?Living arrangements for the past 2 months: [place] ?                ?  ?  ?  ?  ?  ?HH Arranged: PT, OT ?[place] Agency: [place] ?Date HH Agency Contacted: 03/16/21 ?Time [place]: 7902 ?Representative spoke with at [place]: Malachy Mood ? ?Prior Living Arrangements/Services ?Living arrangements for the past 2 months: [place] ?Lives with:: Spouse ?Patient language and need for interpreter reviewed:: Yes ?Do you feel safe going back to the place where you live?: Yes      ?Need for Family Participation in Patient Care: Yes (Comment) ?Care giver support system in place?: Yes (comment) ?  ?Criminal Activity/Legal Involvement Pertinent to Current  Situation/Hospitalization: No - Comment as needed ? ?Activities of Daily Living ?Home Assistive Devices/Equipment: CBG Meter ?ADL Screening (condition at time of admission) ?Patient's cognitive ability adequate to safely complete daily activities?: Yes ?Is the patient deaf or have difficulty hearing?: No ?Does the patient have difficulty seeing, even when wearing glasses/contacts?: No ?Does the patient have difficulty concentrating, remembering, or making decisions?: No ?Patient able to express need for assistance with ADLs?: Yes ?Does the patient have difficulty dressing or bathing?: No ?Independently performs ADLs?: Yes (appropriate for developmental age) ?Does the patient have difficulty walking or climbing stairs?: Yes ?Weakness of Legs: Both ?Weakness of Arms/Hands: None ? ?Permission Sought/Granted ?Permission sought to share information with : Family Supports ?Permission granted to share information with : Yes, Verbal Permission Granted ? Share Information with NAME: Khristian Phillippi ?   ? Permission granted to share info w Relationship: Spouse ? Permission granted to share info w Contact Information: [phone] ? ?Emotional Assessment ?Appearance:: Appears stated age ?Attitude/Demeanor/Rapport: Engaged ?Affect (typically observed): Appropriate ?Orientation: : Oriented to Self, Oriented to Place, Oriented to  Time, Oriented to Situation ?Alcohol / Substance Use: Not Applicable ?Psych Involvement: No (comment) ? ?Admission diagnosis:  CHF (congestive heart failure) ([place]) [I50.9] ?Peripheral edema [R60.9] ?Thrombocytopenia ([place]) [D69.6] ?Anemia, unspecified type [D64.9] ?Low back pain, unspecified back pain laterality, unspecified chronicity, unspecified whether sciatica present [M54.50] ?Patient Active Problem List  ?  Diagnosis Date Noted  ? AKI (acute kidney injury) ([place]) 03/14/2021  ? Acute on chronic diastolic CHF (congestive heart failure) ([place]) 03/13/2021  ? MDS (myelodysplastic syndrome) ([place]) 05/26/2020  ?  Goals of care, counseling/discussion 05/26/2020  ? Acute gallstone pancreatitis   ? Pancytopenia ([place]) 04/24/2020  ? Acute pancreatitis 04/24/2020  ? Cirrhosis of liver on imaging, likely post proton-beam radiation ([place]) 04/24/2020  ? Calculus of bile duct without cholecystitis and without obstruction 04/16/2020  ? Snoring 02/18/2020  ? Hypotension 02/18/2020  ? Hepatocellular carcinoma ([place]) 12/26/2019  ? Colon cancer screening 04/28/2017  ? Penetrating ulcer of aorta ([place]) 02/22/2017  ? Thrombocytopenia ([place]) 12/16/2016  ? Allergic rhinitis 06/03/2016  ? Atopic dermatitis 06/03/2016  ? Bursitis of hip 01/16/2016  ? Degeneration of lumbar intervertebral disc 01/16/2016  ? Osteoarthritis 01/08/2016  ? Shortness of breath 01/02/2016  ? Calculus of kidney 01/23/2013  ? Hyperlipidemia 11/07/2012  ? HTN (hypertension) 10/29/2012  ? Type 2 diabetes mellitus with hyperlipidemia ([place]) 10/29/2012  ? Other and unspecified hyperlipidemia 10/29/2012  ? CAD (coronary artery disease), native coronary artery 10/29/2012  ? S/P CABG x 3 10/29/2012  ? Coronary artery disease 10/29/2012  ? Diabetes mellitus ([place]) 10/29/2012  ? Atypical chest pain 02/29/2012  ? Esophageal reflux 10/04/2011  ? Type 2 diabetes mellitus without complications ([place]) 69/79/4801  ? ?PCP:  Perrin Maltese, MD ?Pharmacy:   ?[id number] [place] - [id number]?Phone: [phone]: [phone]? ? ? ? ?Social Determinants of Health (SDOH) Interventions ?  ? ?Readmission Risk Interventions ?No flowsheet data found. ? ? ?

## 2021-03-16 NOTE — Care Management Important Message (Signed)
Important Message ? ?Patient Details  ?Name: Timothy Murray ?MRN: [id number] ?Date of Birth: [birth year] ? ? ?Medicare Important Message Given:  Yes ? ? ? ? ?Shelda Altes ?03/16/2021, 11:09 AM ?

## 2021-03-16 NOTE — Progress Notes (Addendum)
PT Cancellation Note ? ?Patient Details ?Name: Timothy Murray ?MRN: [id number] ?DOB: September 05, 1945 ? ? ?Cancelled Treatment:    Reason Eval/Treat Not Completed: Pain limiting ability to participate at this time. Pt reports significant pain in low back and bilateral knees, declines all attempts at mobility until after next pain medicine is delivered at 3PM. We discussed gentle ROM while pt in bed to maintain some movement throughout the day, I will attempt to circle back after pt has had this medication. ? ?West Carbo, PT, DPT  ? ?Acute Rehabilitation Department ?Pager #: [phone] - [phone] ? ? ?Sandra Cockayne ?03/16/2021, 1:30 PM ?

## 2021-03-16 NOTE — Progress Notes (Signed)
?Progress Note ? ? ?PatientJaedin Murray ENM:[id number] DOB: 1945-10-09 DOA: 03/13/2021     3 ?DOS: the patient was seen and examined on 03/16/2021 ?  ?Brief hospital course: ?[name] was admitted to the hospital with the working diagnosis of decompensated heart failure.  ? ?76 yo male with the past medical history of myelodysplastic syndrome, chronic thrombocytopenia, cirrhosis, T2DM, and hypertension who presented with chest pain and lower extremity edema. Reported 7 days of worsening lower extremity edema and dyspnea on exertion. On his initial physical examination his blood pressure was 158/76, HR 105. RR 29, 02 saturation 100% on supplemental 02 per [place], lungs with bilateral rales and increased work of breathing, heart with S1 and S2 present and tachycardic with no gallops or murmurs, abdomen soft and positive ++ lower extremity edema.  ? ?Na 132, K  4,0 CL 98, bicarbonate 23, glucose 209, bun 26 and cr 1,36  ?BNP 57.6 ?High sensitive troponin 10 ?Wbc 1.3, hgb 7.7 hvt 21,8 plt 16  ?Sars covid 9 negative  ? ?Chest radiograph with no cardiomegaly, with mild hilar vascular congestion, no infiltrates.  ? ?EKG 91 bpm, normal axis, normal intervals, sinus rhythm, with no significant ST segment or T wave changes.  ? ?Patient placed on furosemide for diuresis. ? ?Worsening anemia and thrombocytopenia. ?03/12 PRBC and pool of platelets transfusion X1  ? ?Assessment and Plan: ?* Acute on chronic diastolic CHF (congestive heart failure) ([place]) ?Echocardiogram with LV EF >75%, preserved RV systolic function, no significant valvular disease.  ?Likely high output heart failure due to anemia.  ? ?Urine output  2,350 ml. ?Systolic blood pressure 031 to 137 mmHg  ?Lower extremity edema has improved.  ? ?Patient tolerated well PRBC and platelet transfusion.  ? ?Plan to transition to oral furosemide in am. ?Continue close blood pressure monitoring  ?Resume metoprolol, continue to hold on losartan for now due to AKI,  ? ?AKI  (acute kidney injury) ([place]) ?Hyponatremia/ hypokalemia.  ?Improved volume status. ?Rena function with serum cr at 1,42 with K at 3,1 and serum bicarbonate at 30. ? ?Plan to transition to oral furosemide in am ?Follow up on renal function in am. ?Keep Hgb above 8.  ?Continue to hold on ARB.  ?  ? ? ?HTN (hypertension) ?Resume metoprolol and continue blood pressure monitoring. ?Transition to oral furosemide in am.  ? ?Type 2 diabetes mellitus with hyperlipidemia ([place]) ?Fasting glucose this am 159 ?Plan to continue insulin sliding scale for glucose cover and monitoring. ? ? ?Continue with statin therapy.  ? ?CAD (coronary artery disease), native coronary artery ?No chest pain today, no wall motion abnormalities on echocardiogram. ? ?Continue blood pressure control. ?No antiplatelet therapy due to thrombocytopenia.  ? ?Hepatocellular carcinoma ([place]) ?No clinical signs of liver failure.  ?Continue with lactulose.  ? ?Pancytopenia ([place]) ?Myelodysplastic syndrome.  ? ?Patient with critical hgb down to 6,8 and Plt at 10. ?Plan to transfuse PRBC and poor of platelets today, follow up cell count in am,  ?Wbc is 1.2  ? ?Continue prophylactic acyclovir and levofloxacin  ? ? ? ? ?  ? ?Subjective: patient is feeling better, his edema has improved, continue to have knee pain bilaterally  ? ?Physical Exam: ?Vitals:  ? 03/15/21 1720 03/15/21 1915 03/16/21 0532 03/16/21 1044  ?BP: (!) 133/48 (!) 126/53 136/63 (!) 137/55  ?Pulse: 90 90 84 85  ?Resp: 20 20 (!) 21 20  ?Temp: 98 ?F (36.7 ?C) 98.3 ?F (36.8 ?C) 98 ?F (36.7 ?C) 98.6 ?F (37 ?C)  ?  TempSrc: Axillary Oral Oral Oral  ?SpO2: 96% 99% 99% 94%  ?Weight:   83.9 kg   ?Height:      ? ?Neurology awake and alert ?ENT with mild pallor ?Cardiovascular with S1 and S2 present and rhythmic with positive systolic murmur at the apex, no gallops ?No JVD ?Trace lower extremity edema ?Respiratory with no wheezing or rhonchi, no rales ?Abdomen soft and non distended ?Skin with positive petechiae.   ?Data Reviewed: ? ? ? ?Family Communication: I spoke with patient's wife at the bedside, we talked in detail about patient's condition, plan of care and prognosis and all questions were addressed. ? ? ?Disposition: ?Status is: Inpatient ?Remains inpatient appropriate because: heart failure  ? Planned Discharge Destination: Home possible dc home tomorrow if stable renal functio and electrolytes.  ? ?Author: ?Tawni Millers, MD ?03/16/2021 11:20 AM ? ?For on call review [email or web address].  ?

## 2021-03-17 ENCOUNTER — Telehealth: Payer: Self-pay | Admitting: *Deleted

## 2021-03-17 ENCOUNTER — Other Ambulatory Visit: Payer: Self-pay | Admitting: Hospice and Palliative Medicine

## 2021-03-17 ENCOUNTER — Other Ambulatory Visit (HOSPITAL_COMMUNITY): Payer: Self-pay

## 2021-03-17 ENCOUNTER — Other Ambulatory Visit: Payer: Self-pay | Admitting: Oncology

## 2021-03-17 DIAGNOSIS — I5033 Acute on chronic diastolic (congestive) heart failure: Secondary | ICD-10-CM | POA: Diagnosis not present

## 2021-03-17 DIAGNOSIS — C220 Liver cell carcinoma: Secondary | ICD-10-CM | POA: Diagnosis not present

## 2021-03-17 DIAGNOSIS — N179 Acute kidney failure, unspecified: Secondary | ICD-10-CM | POA: Diagnosis not present

## 2021-03-17 DIAGNOSIS — I10 Essential (primary) hypertension: Secondary | ICD-10-CM | POA: Diagnosis not present

## 2021-03-17 LAB — BASIC METABOLIC PANEL
Anion gap: 9 (ref 5–15)
BUN: 26 mg/dL — ABNORMAL HIGH (ref 8–23)
CO2: 30 mmol/L (ref 22–32)
Calcium: 8.5 mg/dL — ABNORMAL LOW (ref 8.9–10.3)
Chloride: 99 mmol/L (ref 98–111)
Creatinine, Ser: 1.38 mg/dL — ABNORMAL HIGH (ref 0.61–1.24)
GFR, Estimated: 53 mL/min — ABNORMAL LOW (ref >60)
Glucose, Bld: 125 mg/dL — ABNORMAL HIGH (ref 70–99)
Potassium: 4.2 mmol/L (ref 3.5–5.1)
Sodium: 138 mmol/L (ref 135–145)

## 2021-03-17 LAB — GLUCOSE, CAPILLARY
Glucose-Capillary: 213 mg/dL — ABNORMAL HIGH (ref 70–99)
Glucose-Capillary: 233 mg/dL — ABNORMAL HIGH (ref 70–99)

## 2021-03-17 LAB — PATHOLOGIST SMEAR REVIEW

## 2021-03-17 MED ORDER — OXYCODONE HCL 5 MG PO TABS: 5.0000 mg | ORAL_TABLET | ORAL | 0 refills | Status: DC | PRN

## 2021-03-17 MED ORDER — FUROSEMIDE 40 MG PO TABS
40.0000 mg | ORAL_TABLET | Freq: Every day | ORAL | 0 refills | Status: DC
  Filled 2021-03-17: qty 30, 30d supply, fill #0

## 2021-03-17 MED ORDER — DICLOFENAC SODIUM 1 % EX GEL
1.0000 "application " | Freq: Four times a day (QID) | CUTANEOUS | 0 refills | Status: DC
  Filled 2021-03-17: qty 100, 7d supply, fill #0

## 2021-03-17 MED ORDER — ACETAMINOPHEN 325 MG PO TABS: 650.0000 mg | ORAL_TABLET | Freq: Four times a day (QID) | ORAL | Status: DC | PRN

## 2021-03-17 NOTE — Telephone Encounter (Signed)
Patient called stating he was discharged from hospital today and needs prescription for Oxycodone 10 mg tabs. I asked how many Oxycodone 5 mg tabs he has on hand and he counted and said he only has 4 tabs. I asked if he was taking more than every 4 hours prior to hospitalization and he said no, that he has lost a bottle. He had prescription sent 03/06/21 for # 90 tabs to take 1 every 4 hours as needed which should have lasted til 03/21/21 without being hospitalized, but he was in hospital from 03/13/21-til today.Will you fill Oxycodone 10 mg which is what he was taking in the hospital for him? I do not see that they discharged him home with prescription  ?

## 2021-03-17 NOTE — Discharge Summary (Signed)
?Physician Discharge Summary ?  ?Patient: Timothy Murray MRN: [id number] DOB: 09-Oct-1945  ?Admit date:     03/13/2021  ?Discharge date: 03/17/21  ?Discharge Physician: Timothy Murray  ? ?PCP: Timothy Maltese, MD  ? ?Recommendations at discharge:  ? ? Patient has been placed on furosemide for diuresis ?Follow up renal function in 7 to 10 days. ?Holding losartan until more stable GFR. ?Follow with hematology as scheduled.  ? ?Discharge Diagnoses: ?Principal Problem: ?  Acute on chronic diastolic CHF (congestive heart failure) ([place]) ?Active Problems: ?  AKI (acute kidney injury) ([place]) ?  HTN (hypertension) ?  Type 2 diabetes mellitus with hyperlipidemia ([place]) ?  CAD (coronary artery disease), native coronary artery ?  Hepatocellular carcinoma ([place]) ?  Pancytopenia ([place]) ? ?Resolved Problems: ?  Anemia ? ?Hospital Course: ?Timothy Murray was admitted to the hospital with the working diagnosis of decompensated heart failure.  ? ?76 yo male with the past medical history of myelodysplastic syndrome, chronic thrombocytopenia, cirrhosis, T2DM, and hypertension who presented with chest pain and lower extremity edema. Reported 7 days of worsening lower extremity edema and dyspnea on exertion. On his initial physical examination his blood pressure was 158/76, HR 105. RR 29, 02 saturation 100% on supplemental 02 per [place], lungs with bilateral rales and increased work of breathing, heart with S1 and S2 present and tachycardic with no gallops or murmurs, abdomen soft and positive ++ lower extremity edema.  ? ?Na 132, K  4,0 CL 98, bicarbonate 23, glucose 209, bun 26 and cr 1,36  ?BNP 57.6 ?High sensitive troponin 10 ?Wbc 1.3, hgb 7.7 hvt 21,8 plt 16  ?Sars covid 9 negative  ? ?Chest radiograph with no cardiomegaly, with mild hilar vascular congestion, no infiltrates.  ? ?EKG 91 bpm, normal axis, normal intervals, sinus rhythm, with no significant ST segment or T wave changes.  ? ?Patient placed on furosemide for  diuresis. ? ?Worsening anemia and thrombocytopenia. ?03/12 PRBC and pool of platelets transfusion X1.  ? ?Patient responded well to diuresis, plan to follow up as outpatient.  ? ?Assessment and Plan: ?* Acute on chronic diastolic CHF (congestive heart failure) ([place]) ?Patient was admitted to the cardiac ward and underwent aggressive diuresis with IV furosemide, negative fluid balance was achieved, -2.974 ml, with significant improvement in his symptoms.  ? ?Echocardiogram with LV EF >75%, preserved RV systolic function, no significant valvular disease.  ?Likely high output heart failure due to anemia.  ? ?Patient tolerate well PRBC transfusion with improvement in his anemia. ?Plan to continue diuresis with furosemide and follow up as outpatient. ?Continue with metoprolol. ?Holding on RAS inhibition due to risk of worsening acute renal failure.  ? ?AKI (acute kidney injury) ([place]) ?Hyponatremia/ hypokalemia.  ?Improved volume status. ? ?At the time of his discharge his renal function is improving with serum cr at 1,38, K is 4,2 and serum bicarbonate at 30, ?Plan to continue diuresis with furosemide and follow up renal function as outpatient.  ?  ? ? ?HTN (hypertension) ?Continue blood pressure control with metoprolol an continue diuresis with furosemide.  ? ?Type 2 diabetes mellitus with hyperlipidemia ([place]) ?His glucose remained stable during his hospitalization. ?He was placed on insulin sliding scale for glucose cover and monitoring.  ?At discharge will recommend to hold on glimepiride to prevent hypoglycemia in the setting or renal failure.  ? ?CAD (coronary artery disease), native coronary artery ?No chest pain today, no wall motion abnormalities on echocardiogram. ? ?Continue blood pressure control. ?No antiplatelet therapy due  to thrombocytopenia.  ? ?Hepatocellular carcinoma ([place]) ?No clinical signs of liver failure.  ?Continue with lactulose.  ? ?Pancytopenia ([place]) ?Myelodysplastic syndrome.  ? ?SP one unit  PRBC and one pool of platelets transfusion with good toleration. ?Follow up wbc is 0,8, hgb is 8,5 and Plt 19.  ? ?Continue prophylactic acyclovir and levofloxacin  ?Follow up with hematology as outpatient.  ?Continue with eltrombapag.  ? ? ? ? ?  ? ? ?Consultants: none  ?Procedures performed:  none   ?Disposition: Home ?Diet recommendation:  ?Cardiac diet ?DISCHARGE MEDICATION: ?Allergies as of 03/17/2021   ? ?   Reactions  ? Dome-paste Bandage [wound Dressings]   ? All bandaids cause a rash  ? Soap & Cleansers   ? "liquid soap" antibacterial -rash  ? Latex Rash, Itching  ? ?  ? ?  ?Medication List  ?  ? ?STOP taking these medications   ? ?allopurinol 300 MG tablet ?Commonly known as: ZYLOPRIM ?  ?gabapentin 300 MG capsule ?Commonly known as: NEURONTIN ?  ?glimepiride 1 MG tablet ?Commonly known as: AMARYL ?  ?LORazepam 0.5 MG tablet ?Commonly known as: Ativan ?  ?losartan 25 MG tablet ?Commonly known as: COZAAR ?  ?ondansetron 8 MG tablet ?Commonly known as: Zofran ?  ?predniSONE 20 MG tablet ?Commonly known as: DELTASONE ?  ?prochlorperazine 10 MG tablet ?Commonly known as: COMPAZINE ?  ?senna-docusate 8.6-50 MG tablet ?Commonly known as: Senokot-S ?  ? ?  ? ?TAKE these medications   ? ?acetaminophen 325 MG tablet ?Commonly known as: TYLENOL ?Take 2 tablets (650 mg total) by mouth every 6 (six) hours as needed for headache or mild pain. ?  ?acyclovir 400 MG tablet ?Commonly known as: ZOVIRAX ?TAKE 1 TABLET BY MOUTH TWICE A DAY ?What changed: how to take this ?  ?atorvastatin 40 MG tablet ?Commonly known as: LIPITOR ?Take 40 mg by mouth daily. ?  ?diclofenac Sodium 1 % Gel ?Commonly known as: VOLTAREN ?Apply 1 application. topically 4 (four) times daily. ?  ?eltrombopag 75 MG tablet ?Commonly known as: PROMACTA ?Take 75 mg by mouth daily. Take on an empty stomach 1 hour before meals or 2 hours after. ?What changed: Another medication with the same name was removed. Continue taking this medication, and follow the  directions you see here. ?  ?Fluzone High-Dose Quadrivalent 0.7 ML Susy ?Generic drug: Influenza Vac High-Dose Quad ?Inject 1 each into the skin once. ?  ?furosemide 40 MG tablet ?Commonly known as: LASIX ?Take 1 tablet (40 mg total) by mouth daily. ?Start taking on: March 18, 2021 ?  ?lactulose 10 GM/15ML solution ?Commonly known as: [place] ?Take 15 - 30 ml daily as needed for constipation ?What changed:  ?how much to take ?how to take this ?when to take this ?reasons to take this ?additional instructions ?  ?levofloxacin 500 MG tablet ?Commonly known as: LEVAQUIN ?TAKE 1 TABLET (500 MG TOTAL) BY MOUTH DAILY. ?What changed: additional instructions ?  ?loratadine 10 MG tablet ?Commonly known as: CLARITIN ?Take 5 mg by mouth daily as needed for allergies. Take half a tablet (80m) daily ?  ?metoprolol tartrate 25 MG tablet ?Commonly known as: LOPRESSOR ?Take 25 mg by mouth 2 (two) times daily. ?  ?nitroGLYCERIN 0.3 MG SL tablet ?Commonly known as: NITROSTAT ?0.3 mg every 5 (five) minutes as needed for chest pain. ?  ?omeprazole 40 MG capsule ?Commonly known as: PRILOSEC ?TAKE 1 CAPSULE BY MOUTH TWICE A DAY ?What changed:  ?how to take this ?when to take  this ?reasons to take this ?  ?OneTouch Delica Plus [id number] Misc ?Apply 1 each topically 2 (two) times daily. ?  ?OneTouch Verio Flex System w/Device Kit ?1 each by Does not apply route in the morning and at bedtime. ?  ?OneTouch Verio test strip ?Generic drug: glucose blood ?1 each by Other route 2 (two) times daily. ?  ?oxyCODONE 5 MG immediate release tablet ?Commonly known as: Oxy IR/ROXICODONE ?Take 1 tablet (5 mg total) by mouth every 4 (four) hours as needed for severe pain. ?  ?traZODone 50 MG tablet ?Commonly known as: DESYREL ?TAKE 1 TO 2 TABLETS BY MOUTH AT BEDTIME ?What changed:  ?how much to take ?when to take this ?reasons to take this ?  ? ?  ? ?  ?  ? ? ?  ?Durable Medical Equipment  ?(From admission, onward)  ?  ? ? ?  ? ?  Start     Ordered  ?  03/17/21 0836  For home use only DME Walker rolling  Once       ?Comments: Patient needs a rollator  ?Question Answer Comment  ?Walker: With 5 Inch Wheels   ?Patient needs a walker to treat with the following condition [place]

## 2021-03-17 NOTE — Progress Notes (Signed)
PT Cancellation Note ? ?Patient Details ?Name: Timothy Murray ?MRN: [id number] ?DOB: Oct 16, 1945 ? ? ?Cancelled Treatment:    Reason Eval/Treat Not Completed: Patient declined, no reason specified (pt reports 8 and 10/10 back and neck pain despite pain medication at 0930. Pt stating he will not risk moving today and creating pain as he anticipates D/C. Pt declined attempting at later time today) ? ? ?[name] [name] ?03/17/2021, 10:15 AM ?[name], PT ?Acute Rehabilitation Services ?Pager: [phone] ?Office: [phone] ? ?

## 2021-03-17 NOTE — Patient Outreach (Signed)
Timothy Murray) Care Management ? ?03/17/2021 ? ?[name] ?03-18-1945 ?[id number] ? ? ?Received Murray referral from Natividad Brood, RN for complex care and disease management follow up calls and assess for further needs. Assigned to Deloria Lair, RN Care Coordinator for follow up. ? ?[name] ?[place] Management Assistant ?[phone] ? ?

## 2021-03-17 NOTE — TOC Progression Note (Addendum)
Transition of Care (TOC) - Progression Note  ? ? ?Patient Details  ?Name: Timothy Murray ?MRN: [id number] ?Date of Birth: 03-07-1945 ? ?Transition of Care (TOC) CM/SW Contact  ?Ninfa Meeker, RN ?Phone Number: ?03/17/2021, 10:20 AM ? ?Clinical Narrative:   Case manager has contacted Jazmine with [employer], requesting Rollator ,3in1 and wheelchair for patient. 3in1 and wheelchair will be delivered to room today, rollator will be delivered to patient's home tomorrow.  ?1042: update from [employer]: Rollator and 3in1 will be delivered to room, wheelchair is already scheduled from yesterday to be delivered to patient's home tomorrow.  ? ? ?Expected Discharge Plan: [place] ?Barriers to Discharge: Continued Medical Work up, Equipment Delay ? ?Expected Discharge Plan and Services ?Expected Discharge Plan: [place] ?  ?  ?Post Acute Care Choice: Home Health ?Living arrangements for the past 2 months: [place] ?Expected Discharge Date: 03/17/21               ?  ?  ?  ?  ?  ?HH Arranged: PT, OT ?[place] Agency: [place] ?Date HH Agency Contacted: 03/16/21 ?Time [place]: 5003 ?Representative spoke with at [place]: Malachy Mood ? ? ?Social Determinants of Health (SDOH) Interventions ?  ? ?Readmission Risk Interventions ?No flowsheet data found. ? ?

## 2021-03-17 NOTE — Telephone Encounter (Signed)
-----   Message from Secundino Ginger sent at 03/16/2021  9:04 AM EDT ----- ?Regarding: answering service message ?Pateint left message that he was not going to make his 8:30 am appt. Today.  ? ?

## 2021-03-17 NOTE — Telephone Encounter (Signed)
Called pt to talk to him about not able to come to appt. Also his pain message to office. He was in hospital 3/10 to 3/14. He has next appt 3/20 for labs, see md and treatment. He asked for pain med to be sent to his pharmacy. I looked in the computer and it did say that in the d/c info  but no rx was sent. The pt. Then called the office to see if we could help him. Dr. Janese Banks had sent him rx for oxycodone on 3/3 and he could take it every 4 hours and 90 quantity. I asked him about this. He says he only has 4 pills and he can't find the bottle- he lost it. I let him know that when it comes to narcotic medications, we have to be cautious do what is necessary for the patient and his pain medication but the patient also needs to know where is pain med is and if they need to put it somewhere safe if there are other people in home or visits. I asked him how did he loose it. He said they thought about a spot to put it in and both he and his wife can't remember. I told him that he will need to make a system for both of them to put the medication in a safe place and can remember. If this happens in the future he made not same med or same quantity and will probably have to sign contract for pain meds. He says that he and wife will think of a spot to always put it in. He also said he cancelled the Nu motion wheelchair and went with another provider and he should be getting it in few days. He states that his back is still in pain for him. He can get 30 min of heat on it and has some relief ?

## 2021-03-17 NOTE — Consult Note (Signed)
? ?  [facility] CM Inpatient Consult ? ? ?03/17/2021 ? ?[name] ?Aug 09, 1945 ?[id number] ? ?[facility] [ACO] Patient: Timothy Murray Medicare ? ?Primary Care Provider:  Perrin Maltese, MD, [facility]  ? ?Patient screened for hospitalization with noted extreme high risk score for unplanned readmission risk and to assess for potential [place] Management service needs for post hospital transition.  Review of patient's medical record reveals patient is for transitioning home with home health. Met with patient and wife at the bedside.  Patient alert but keeps his eyes closed during round.  Both state that wife is the contact for post hospital follow up.  Explained [facility] RN for post hospital follow up. Agreed to post hospital follow up calls and assess for care management follow up needs. Wife given a post hospital appointment card reminder and a 24 hour nurse advise line magnet. ? ? ?Plan:  Referral request to assigned for [facility] Management follow up..   ? ?For questions contact:  ? ?Natividad Brood, RN BSN CCM ?[facility] Liaison ? [phone] business mobile phone ?Toll free office [phone]  ?Fax number: [phone] ?[email or web address]  ? ? ? ?

## 2021-03-18 ENCOUNTER — Encounter: Payer: Self-pay | Admitting: Oncology

## 2021-03-18 ENCOUNTER — Other Ambulatory Visit: Payer: Self-pay | Admitting: Pharmacist

## 2021-03-18 ENCOUNTER — Other Ambulatory Visit: Payer: Self-pay | Admitting: Oncology

## 2021-03-18 ENCOUNTER — Other Ambulatory Visit (HOSPITAL_COMMUNITY): Payer: Self-pay

## 2021-03-18 DIAGNOSIS — D469 Myelodysplastic syndrome, unspecified: Secondary | ICD-10-CM

## 2021-03-18 DIAGNOSIS — M5416 Radiculopathy, lumbar region: Secondary | ICD-10-CM | POA: Diagnosis not present

## 2021-03-18 MED ORDER — ELTROMBOPAG OLAMINE 50 MG PO TABS
100.0000 mg | ORAL_TABLET | Freq: Every day | ORAL | 0 refills | Status: DC
  Filled 2021-03-18 (×2): qty 60, 30d supply, fill #0

## 2021-03-19 ENCOUNTER — Other Ambulatory Visit (HOSPITAL_COMMUNITY): Payer: Self-pay

## 2021-03-23 ENCOUNTER — Encounter: Payer: Self-pay | Admitting: Oncology

## 2021-03-23 ENCOUNTER — Other Ambulatory Visit: Payer: Self-pay

## 2021-03-23 ENCOUNTER — Inpatient Hospital Stay: Payer: Medicare HMO

## 2021-03-23 ENCOUNTER — Inpatient Hospital Stay (HOSPITAL_BASED_OUTPATIENT_CLINIC_OR_DEPARTMENT_OTHER): Payer: Medicare HMO | Admitting: Hospice and Palliative Medicine

## 2021-03-23 ENCOUNTER — Inpatient Hospital Stay (HOSPITAL_BASED_OUTPATIENT_CLINIC_OR_DEPARTMENT_OTHER): Payer: Medicare HMO | Admitting: Oncology

## 2021-03-23 VITALS — BP 137/69 | HR 134 | Temp 98.7°F | Resp 20 | Wt 190.8 lb

## 2021-03-23 DIAGNOSIS — D469 Myelodysplastic syndrome, unspecified: Secondary | ICD-10-CM | POA: Diagnosis not present

## 2021-03-23 DIAGNOSIS — Z87891 Personal history of nicotine dependence: Secondary | ICD-10-CM | POA: Diagnosis not present

## 2021-03-23 DIAGNOSIS — Z79899 Other long term (current) drug therapy: Secondary | ICD-10-CM | POA: Diagnosis not present

## 2021-03-23 DIAGNOSIS — Z923 Personal history of irradiation: Secondary | ICD-10-CM | POA: Diagnosis not present

## 2021-03-23 DIAGNOSIS — K7460 Unspecified cirrhosis of liver: Secondary | ICD-10-CM | POA: Diagnosis not present

## 2021-03-23 DIAGNOSIS — M5440 Lumbago with sciatica, unspecified side: Secondary | ICD-10-CM | POA: Diagnosis not present

## 2021-03-23 DIAGNOSIS — D61818 Other pancytopenia: Secondary | ICD-10-CM | POA: Diagnosis not present

## 2021-03-23 DIAGNOSIS — Z7189 Other specified counseling: Secondary | ICD-10-CM | POA: Diagnosis not present

## 2021-03-23 DIAGNOSIS — D696 Thrombocytopenia, unspecified: Secondary | ICD-10-CM

## 2021-03-23 DIAGNOSIS — D6959 Other secondary thrombocytopenia: Secondary | ICD-10-CM | POA: Diagnosis not present

## 2021-03-23 DIAGNOSIS — Z8505 Personal history of malignant neoplasm of liver: Secondary | ICD-10-CM | POA: Diagnosis not present

## 2021-03-23 DIAGNOSIS — G8929 Other chronic pain: Secondary | ICD-10-CM | POA: Diagnosis not present

## 2021-03-23 DIAGNOSIS — D46C Myelodysplastic syndrome with isolated del(5q) chromosomal abnormality: Secondary | ICD-10-CM | POA: Diagnosis present

## 2021-03-23 DIAGNOSIS — D649 Anemia, unspecified: Secondary | ICD-10-CM

## 2021-03-23 DIAGNOSIS — M545 Low back pain, unspecified: Secondary | ICD-10-CM | POA: Diagnosis not present

## 2021-03-23 LAB — CBC WITH DIFFERENTIAL/PLATELET
Abs Immature Granulocytes: 0.02 10*3/uL (ref 0.00–0.07)
Basophils Absolute: 0 10*3/uL (ref 0.0–0.1)
Basophils Relative: 1 %
Eosinophils Absolute: 0 10*3/uL (ref 0.0–0.5)
Eosinophils Relative: 0 %
HCT: 22.9 % — ABNORMAL LOW (ref 39.0–52.0)
Hemoglobin: 8.1 g/dL — ABNORMAL LOW (ref 13.0–17.0)
Immature Granulocytes: 3 %
Lymphocytes Relative: 42 %
Lymphs Abs: 0.3 10*3/uL — ABNORMAL LOW (ref 0.7–4.0)
MCH: 33.3 pg (ref 26.0–34.0)
MCHC: 35.4 g/dL (ref 30.0–36.0)
MCV: 94.2 fL (ref 80.0–100.0)
Monocytes Absolute: 0.1 10*3/uL (ref 0.1–1.0)
Monocytes Relative: 12 %
Neutro Abs: 0.3 10*3/uL — CL (ref 1.7–7.7)
Neutrophils Relative %: 42 %
Platelets: <5 10*3/uL — CL (ref 150–400)
RBC: 2.43 MIL/uL — ABNORMAL LOW (ref 4.22–5.81)
RDW: 14.7 % (ref 11.5–15.5)
Smear Review: DECREASED
WBC: 0.8 10*3/uL — CL (ref 4.0–10.5)
nRBC: 0 % (ref 0.0–0.2)

## 2021-03-23 LAB — COMPREHENSIVE METABOLIC PANEL
ALT: 28 U/L (ref 0–44)
AST: 61 U/L — ABNORMAL HIGH (ref 15–41)
Albumin: 3 g/dL — ABNORMAL LOW (ref 3.5–5.0)
Alkaline Phosphatase: 100 U/L (ref 38–126)
Anion gap: 11 (ref 5–15)
BUN: 35 mg/dL — ABNORMAL HIGH (ref 8–23)
CO2: 26 mmol/L (ref 22–32)
Calcium: 8.6 mg/dL — ABNORMAL LOW (ref 8.9–10.3)
Chloride: 97 mmol/L — ABNORMAL LOW (ref 98–111)
Creatinine, Ser: 1.13 mg/dL (ref 0.61–1.24)
GFR, Estimated: >60 mL/min (ref >60)
Glucose, Bld: 73 mg/dL (ref 70–99)
Potassium: 3.5 mmol/L (ref 3.5–5.1)
Sodium: 134 mmol/L — ABNORMAL LOW (ref 135–145)
Total Bilirubin: 0.7 mg/dL (ref 0.3–1.2)
Total Protein: 6.5 g/dL (ref 6.5–8.1)

## 2021-03-23 LAB — SAMPLE TO BLOOD BANK

## 2021-03-23 MED ORDER — HYDROMORPHONE HCL 1 MG/ML IJ SOLN
0.5000 mg | Freq: Once | INTRAMUSCULAR | Status: AC
  Administered 2021-03-23: 0.5 mg via INTRAVENOUS
  Filled 2021-03-23: qty 1

## 2021-03-23 MED ORDER — FENTANYL 12 MCG/HR TD PT72: 1.0000 | MEDICATED_PATCH | TRANSDERMAL | 0 refills | Status: AC

## 2021-03-23 NOTE — Progress Notes (Signed)
Pt given 0.5 Dilaudid IV for 10/10 pain. Tolerated med well. Discharged home with wife. Stated pain better 6/10 at discharge ?

## 2021-03-23 NOTE — Progress Notes (Signed)
Virtual Visit via Telephone Note ? ?I connected with Timothy Murray on 03/23/21 at  1:20 PM EDT by telephone and verified that I am speaking with the correct person using two identifiers. ? ?Location: ?Patient: Home ?Provider: Clinic ?  ?I discussed the limitations, risks, security and privacy concerns of performing an evaluation and management service by telephone and the availability of in person appointments. I also discussed with the patient that there may be a patient responsible charge related to this service. The patient expressed understanding and agreed to proceed. ? ? ?History of Present Illness: ?Timothy Murray is a 76 y.o. male with multiple medical problems including history of cirrhosis and HCC status post proton beam radiation in June 2021.  Patient now with worsening MDS with signs of clinical progression on Vidaza and Promacta. ?  ?Observations/Objective: ?Patient saw Dr. Janese Banks this morning for management of high risk MDS.  Patient has been managed on [place] but has had worsening cytopenias concerning for disease progression.  Unfortunately, patient's performance status has also been declining and hospice was recommended. ? ?I called and spoke with patient, wife, and son Timothy Murray).  Together, we discussed patient's overall decline and recommendation to proceed with comfort measures/hospice care.  Both patient and family verbalized agreement with hospice and focusing on keeping him comfortable at home until end-of-life. ? ?Assessment and Plan: ?MDS -hospice referral.  Referral sent to [facility]. Proceed with comfort measures ? ?Follow Up Instructions: ?As needed ?  ?I discussed the assessment and treatment plan with the patient. The patient was provided an opportunity to ask questions and all were answered. The patient agreed with the plan and demonstrated an understanding of the instructions. ?  ?The patient was advised to call back or seek an in-person evaluation if the symptoms worsen  or if the condition fails to improve as anticipated. ? ?I provided 15 minutes of non-face-to-face time during this encounter. ? ? ?Irean Hong, NP ? ? ?

## 2021-03-23 NOTE — Progress Notes (Signed)
? ? ? ?Hematology/Oncology Consult note ?[place]  ?[phone] [phone] ? ?Patient Care Team: ?Perrin Maltese, MD as PCP - General (Internal Medicine) ?Dionisio David, MD (Cardiology) ?Sindy Guadeloupe, MD as Consulting Physician (Hematology and Oncology) ?Deloria Lair, NP as [facility]  ? ?Name of the patient: Timothy Murray  ?[id number]  ?07/14/1945  ? ?Date of visit: 03/23/21 ? ?Diagnosis- MDS with 5q del but high risk given TP53 mutation ?2. [place] s/p thermal ablation ? ?Chief complaint/ Reason for visit-discuss overall goals of care and further management of MDS ? ?Heme/Onc history: Patient is a 76 year old male with a history of cirrhosis and hepatocellular carcinoma for which she had proton beam radiation In June 2021.  He has baseline thrombocytopenia which was initially attributed to his cirrhosis.  He was found to have progressive pancytopenia with a steady decline in his white cell count since March 2022 when it was down to 3.4 with a platelet count of 53.  He was hospitalized sometime in April 2022 with acute biliary gallstone pancreatitis .  He underwent ERCP inpatient with platelet transfusion which showed sludge and biliary sphincterotomy was performed.  His bilirubin which was elevated up to 7.2 has come down to 2.4 presently. ?  ?However his pancytopenia has continued to worsen and presently white count is 1.1 with an H&H of 6.2/17.6 with an MCV of 102 and a platelet count of 33.Bone marrow biopsy showed evidence of dyspoietic changes in the erythroid and megakaryocyte excel lines associated with ring sideroblasts.  Definite increase in blastic cells not identified.  Overall features favor myelodysplastic syndrome.  Karyotype was normal but only for metaphase cells were analyzed which were not sufficient.  [facility] FISH MDS panel showed deletion of 5 q. along with monosomy 7 and deletion 7 q. without excess blasts.   SF3B1 mutation negative. ?  ?Peripheral blood intellegin myeloid panel showed :  ? Detected     Change       Frequency (%) Impact  ?TP53   c.716A>C     p.Asn239Thr  19            Tier I  ?U2AF1  c.470A>C     p.Gln157Pro  17            Tier I  ?IDH1   c.315C>T     p.Gly105Gly  49            Tier II  ?  ?  ?With regards to his hepatocellular carcinoma patient had an MRI abdomen on 04/24/2020 which showed that his primary liver mass which was previously 4.3 x 3.6 cmWas similar in size at 4.5 x 3.5 cm.  ?  ?Patient was briefly on Revlimid for 5 q. deletion but after T p53 came back he was switched to [place] for 5 days every 28 days starting 05/27/2020  ?  ?Patient was seen by Dr. Marvel Plan at [facility] for second opinion and he also had a repeat bone marrow biopsyDone on 08/14/2020.  Biopsy showed normocellular bone marrow with erythroid predominant trilineage hematopoiesis.  Megakaryocytes were typical in number with few hypolobated forms.  Full sequence of orderly maturation noted in both granulopoiesis and erythropoiesis.  3% blasts were seen by manual differential. ?  ?Plan is to continue Vidaza until progression or toxicity which was started in May 2022.  Promacta started in February 2023 for worsening thrombocytopenia patient continues to feel poorly.   ? ?Interval history- He was recently discharged from  the hospital on 03/17/2021 For decompensated heart failure.  He was given IV diuretics.  He continues to feel poorly.  Reports significant back pain as well as ongoing fatigue.  Appetite is poor ? ?ECOG PS- 3 ?Pain scale- 6 ? ?Review of systems- Review of Systems  ?Constitutional:  Positive for malaise/fatigue. Negative for chills, fever and weight loss.  ?HENT:  Negative for congestion, ear discharge and nosebleeds.   ?Eyes:  Negative for blurred vision.  ?Respiratory:  Negative for cough, hemoptysis, sputum production, shortness of breath and wheezing.   ?Cardiovascular:  Negative for chest pain, palpitations, orthopnea  and claudication.  ?Gastrointestinal:  Negative for abdominal pain, blood in stool, constipation, diarrhea, heartburn, melena, nausea and vomiting.  ?Genitourinary:  Negative for dysuria, flank pain, frequency, hematuria and urgency.  ?Musculoskeletal:  Positive for back pain. Negative for joint pain and myalgias.  ?Skin:  Negative for rash.  ?Neurological:  Negative for dizziness, tingling, focal weakness, seizures, weakness and headaches.  ?Endo/Heme/Allergies:  Does not bruise/bleed easily.  ?Psychiatric/Behavioral:  Negative for depression and suicidal ideas. The patient does not have insomnia.    ? ? ? ?Allergies  ?Allergen Reactions  ? Dome-Paste Bandage [Wound Dressings]   ?  All bandaids cause a rash  ? Soap & Cleansers   ?  "liquid soap" antibacterial -rash  ? Latex Rash and Itching  ? ? ? ?Past Medical History:  ?Diagnosis Date  ? Arthritis   ? "lower back" (10/24/2012)  ? Coronary artery disease   ? GERD (gastroesophageal reflux disease)   ? High cholesterol   ? Hypertension   ? Liver cancer ([place])   ? MDS (myelodysplastic syndrome) ([place])   ? Type II diabetes mellitus ([place])   ? ? ? ?Past Surgical History:  ?Procedure Laterality Date  ? APPENDECTOMY  2002  ? CARDIAC CATHETERIZATION  10/24/2012  ? CORONARY ARTERY BYPASS GRAFT N/A 10/26/2012  ? Procedure: CORONARY ARTERY BYPASS GRAFTING (CABG);  Surgeon: Melrose Nakayama, MD;  Location: [facility];  Service: Open Heart Surgery;  Laterality: N/A;  Times 3 using left internal mammary artery and endoscopically harvested right saphenous vein  ? ERCP N/A 04/25/2020  ? Procedure: ENDOSCOPIC RETROGRADE CHOLANGIOPANCREATOGRAPHY (ERCP);  Surgeon: Lucilla Lame, MD;  Location: [facility] ENDOSCOPY;  Service: Endoscopy;  Laterality: N/A;  ? IR BONE MARROW BIOPSY & ASPIRATION  01/26/2021  ? ? ?Social History  ? ?Socioeconomic History  ? Marital status: Married  ?  Spouse name: Not on file  ? Number of children: Not on file  ? Years of education: Not on file  ? Highest education  level: Not on file  ?Occupational History  ? Not on file  ?Tobacco Use  ? Smoking status: Former  ?  Packs/day: 0.50  ?  Years: 30.00  ?  Pack years: 15.00  ?  Types: Cigarettes  ?  Quit date: 08/28/2001  ?  Years since quitting: 19.5  ? Smokeless tobacco: Never  ?Vaping Use  ? Vaping Use: Never used  ?Substance and Sexual Activity  ? Alcohol use: No  ?  Alcohol/week: 0.0 standard drinks  ?  Comment: 10/24/2012 "stopped drinking alcohol in 2003; used to drink ~ 9 bottles beer/wk"  ? Drug use: No  ? Sexual activity: Yes  ?Other Topics Concern  ? Not on file  ?Social History Narrative  ? Not on file  ? ?Social Determinants of Health  ? ?Financial Resource Strain: Not on file  ?Food Insecurity: Not on file  ?Transportation Needs: Not on  file  ?Physical Activity: Not on file  ?Stress: Not on file  ?Social Connections: Not on file  ?Intimate Partner Violence: Not on file  ? ? ?Family History  ?Problem Relation Age of Onset  ? Cancer Sister   ? ? ? ?Current Outpatient Medications:  ?  acetaminophen (TYLENOL) 325 MG tablet, Take 2 tablets (650 mg total) by mouth every 6 (six) hours as needed for headache or mild pain., Disp: , Rfl:  ?  acyclovir (ZOVIRAX) 400 MG tablet, TAKE 1 TABLET BY MOUTH TWICE A DAY, Disp: 60 tablet, Rfl: 1 ?  atorvastatin (LIPITOR) 40 MG tablet, Take 40 mg by mouth daily., Disp: , Rfl:  ?  Blood Glucose Monitoring Suppl ([place]) w/Device KIT, 1 each by Does not apply route in the morning and at bedtime., Disp: , Rfl:  ?  diclofenac Sodium (VOLTAREN) 1 % GEL, Apply 1 application topically to affected area 4 (four) times daily., Disp: 100 g, Rfl: 0 ?  eltrombopag (PROMACTA) 50 MG tablet, Take 2 tablets (100 mg total) by mouth daily. Take on an empty stomach 1 hour before a meal or 2 hours after, Disp: 60 tablet, Rfl: 0 ?  fentaNYL (DURAGESIC) 12 MCG/HR, Place 1 patch onto the skin every 3 (three) days for 3 days., Disp: 5 patch, Rfl: 0 ?  FLUZONE HIGH-DOSE QUADRIVALENT 0.7 ML SUSY,  Inject 1 each into the skin once., Disp: , Rfl:  ?  furosemide (LASIX) 40 MG tablet, Take 1 tablet (40 mg total) by mouth daily., Disp: 30 tablet, Rfl: 0 ?  lactulose (CHRONULAC) 10 GM/15ML solution, Take 1

## 2021-03-23 NOTE — Progress Notes (Signed)
Patient reports having occasional nausea and constipation.  Having low back, hip, and knee pain (5/10 pain scale today).  Patient's glucose has been dropping and was in 30's this morning and last night, glucose 73 on office check. ?

## 2021-03-25 ENCOUNTER — Other Ambulatory Visit: Payer: Self-pay | Admitting: *Deleted

## 2021-03-25 ENCOUNTER — Telehealth: Payer: Self-pay | Admitting: *Deleted

## 2021-03-25 NOTE — Telephone Encounter (Signed)
I received at secure chat: Hi, the above patient had Timothy Murray with [facility] to come out here today.  She states, he is requesting them for hospice services.  Can you call her and patient?   I have her card with me as well [phone] ? ?The patient had [facility] to come out 1 time and they had been trting to call him and never got  response.  ? ?I called the Timothy Murray and let him know that [facility] was calling him to get set up for hospice and Dawn smith from [place] and wanted to ask Timothy Murray whichcompany he would like to have to help him and he wanted [facility]. I called Dawn back and let her know that he wants her services and I faxed the order to [phone]. Timothy Murray is aware that I was sending info to [facility] for services ?

## 2021-03-27 ENCOUNTER — Other Ambulatory Visit: Payer: Self-pay | Admitting: *Deleted

## 2021-03-27 NOTE — Patient Outreach (Signed)
03/27/21 ? ?Telephone outreach for [facility] services, referral from MD office. ? ?Talked with Timothy Murray and he states he does not think he needs the service. He handed the phone to his daughter, Lenna Sciara. NP explained the services. Daughter reports her father is now receiving Hospice care. Advised that he would not need or qualify for our service as Hospice will be very involved and take care of all Mr. Kerlin and family needs. ? ?Eulah Pont. Teasia Zapf, MSN, GNP-BC ?Gerontological Nurse Practitioner ?[facility] ?[phone] ? ?

## 2021-03-29 ENCOUNTER — Inpatient Hospital Stay (HOSPITAL_COMMUNITY)
Admission: EM | Admit: 2021-03-29 | Discharge: 2021-03-31 | DRG: 809 | Disposition: A | Discharge status: Hospice | Payer: Medicare Other | Attending: Internal Medicine | Admitting: Internal Medicine

## 2021-03-29 ENCOUNTER — Other Ambulatory Visit: Payer: Self-pay

## 2021-03-29 ENCOUNTER — Emergency Department (HOSPITAL_COMMUNITY): Payer: Medicare Other

## 2021-03-29 ENCOUNTER — Encounter (HOSPITAL_COMMUNITY): Payer: Self-pay

## 2021-03-29 DIAGNOSIS — Z8505 Personal history of malignant neoplasm of liver: Secondary | ICD-10-CM

## 2021-03-29 DIAGNOSIS — D703 Neutropenia due to infection: Secondary | ICD-10-CM | POA: Diagnosis not present

## 2021-03-29 DIAGNOSIS — R195 Other fecal abnormalities: Secondary | ICD-10-CM | POA: Diagnosis present

## 2021-03-29 DIAGNOSIS — Z79899 Other long term (current) drug therapy: Secondary | ICD-10-CM

## 2021-03-29 DIAGNOSIS — I10 Essential (primary) hypertension: Secondary | ICD-10-CM | POA: Diagnosis not present

## 2021-03-29 DIAGNOSIS — R5081 Fever presenting with conditions classified elsewhere: Secondary | ICD-10-CM | POA: Diagnosis present

## 2021-03-29 DIAGNOSIS — C220 Liver cell carcinoma: Secondary | ICD-10-CM | POA: Diagnosis present

## 2021-03-29 DIAGNOSIS — Z91040 Latex allergy status: Secondary | ICD-10-CM

## 2021-03-29 DIAGNOSIS — D61818 Other pancytopenia: Secondary | ICD-10-CM | POA: Diagnosis present

## 2021-03-29 DIAGNOSIS — Z888 Allergy status to other drugs, medicaments and biological substances status: Secondary | ICD-10-CM | POA: Diagnosis not present

## 2021-03-29 DIAGNOSIS — D709 Neutropenia, unspecified: Secondary | ICD-10-CM | POA: Diagnosis present

## 2021-03-29 DIAGNOSIS — K7460 Unspecified cirrhosis of liver: Secondary | ICD-10-CM | POA: Diagnosis present

## 2021-03-29 DIAGNOSIS — Z7189 Other specified counseling: Secondary | ICD-10-CM

## 2021-03-29 DIAGNOSIS — B370 Candidal stomatitis: Secondary | ICD-10-CM | POA: Diagnosis present

## 2021-03-29 DIAGNOSIS — I110 Hypertensive heart disease with heart failure: Secondary | ICD-10-CM | POA: Diagnosis present

## 2021-03-29 DIAGNOSIS — D469 Myelodysplastic syndrome, unspecified: Secondary | ICD-10-CM

## 2021-03-29 DIAGNOSIS — Z87891 Personal history of nicotine dependence: Secondary | ICD-10-CM

## 2021-03-29 DIAGNOSIS — E1165 Type 2 diabetes mellitus with hyperglycemia: Secondary | ICD-10-CM | POA: Diagnosis present

## 2021-03-29 DIAGNOSIS — Z66 Do not resuscitate: Secondary | ICD-10-CM | POA: Diagnosis present

## 2021-03-29 DIAGNOSIS — E872 Acidosis, unspecified: Secondary | ICD-10-CM | POA: Diagnosis present

## 2021-03-29 DIAGNOSIS — Z923 Personal history of irradiation: Secondary | ICD-10-CM | POA: Diagnosis not present

## 2021-03-29 DIAGNOSIS — R509 Fever, unspecified: Secondary | ICD-10-CM | POA: Diagnosis not present

## 2021-03-29 DIAGNOSIS — D62 Acute posthemorrhagic anemia: Secondary | ICD-10-CM

## 2021-03-29 DIAGNOSIS — Z515 Encounter for palliative care: Secondary | ICD-10-CM | POA: Diagnosis not present

## 2021-03-29 DIAGNOSIS — Z20822 Contact with and (suspected) exposure to covid-19: Secondary | ICD-10-CM | POA: Diagnosis present

## 2021-03-29 DIAGNOSIS — I2510 Atherosclerotic heart disease of native coronary artery without angina pectoris: Secondary | ICD-10-CM

## 2021-03-29 DIAGNOSIS — Z7984 Long term (current) use of oral hypoglycemic drugs: Secondary | ICD-10-CM

## 2021-03-29 DIAGNOSIS — I5032 Chronic diastolic (congestive) heart failure: Secondary | ICD-10-CM | POA: Diagnosis present

## 2021-03-29 DIAGNOSIS — K219 Gastro-esophageal reflux disease without esophagitis: Secondary | ICD-10-CM

## 2021-03-29 DIAGNOSIS — M25569 Pain in unspecified knee: Secondary | ICD-10-CM | POA: Diagnosis present

## 2021-03-29 DIAGNOSIS — E1169 Type 2 diabetes mellitus with other specified complication: Secondary | ICD-10-CM

## 2021-03-29 DIAGNOSIS — M549 Dorsalgia, unspecified: Secondary | ICD-10-CM | POA: Diagnosis present

## 2021-03-29 DIAGNOSIS — E782 Mixed hyperlipidemia: Secondary | ICD-10-CM | POA: Diagnosis present

## 2021-03-29 DIAGNOSIS — R531 Weakness: Secondary | ICD-10-CM | POA: Diagnosis not present

## 2021-03-29 DIAGNOSIS — Z951 Presence of aortocoronary bypass graft: Secondary | ICD-10-CM | POA: Diagnosis not present

## 2021-03-29 DIAGNOSIS — Z79890 Hormone replacement therapy (postmenopausal): Secondary | ICD-10-CM

## 2021-03-29 LAB — BASIC METABOLIC PANEL
Anion gap: 11 (ref 5–15)
BUN: 22 mg/dL (ref 8–23)
CO2: 25 mmol/L (ref 22–32)
Calcium: 8.5 mg/dL — ABNORMAL LOW (ref 8.9–10.3)
Chloride: 96 mmol/L — ABNORMAL LOW (ref 98–111)
Creatinine, Ser: 1.24 mg/dL (ref 0.61–1.24)
GFR, Estimated: >60 mL/min (ref >60)
Glucose, Bld: 233 mg/dL — ABNORMAL HIGH (ref 70–99)
Potassium: 4 mmol/L (ref 3.5–5.1)
Sodium: 132 mmol/L — ABNORMAL LOW (ref 135–145)

## 2021-03-29 LAB — CBC WITH DIFFERENTIAL/PLATELET
Abs Immature Granulocytes: 0.04 10*3/uL (ref 0.00–0.07)
Basophils Absolute: 0 10*3/uL (ref 0.0–0.1)
Basophils Relative: 0 %
Eosinophils Absolute: 0 10*3/uL (ref 0.0–0.5)
Eosinophils Relative: 0 %
HCT: 13.2 % — ABNORMAL LOW (ref 39.0–52.0)
Hemoglobin: 4.7 g/dL — CL (ref 13.0–17.0)
Immature Granulocytes: 5 %
Lymphocytes Relative: 40 %
Lymphs Abs: 0.3 10*3/uL — ABNORMAL LOW (ref 0.7–4.0)
MCH: 33.8 pg (ref 26.0–34.0)
MCHC: 35.6 g/dL (ref 30.0–36.0)
MCV: 95 fL (ref 80.0–100.0)
Monocytes Absolute: 0.1 10*3/uL (ref 0.1–1.0)
Monocytes Relative: 14 %
Neutro Abs: 0.3 10*3/uL — CL (ref 1.7–7.7)
Neutrophils Relative %: 41 %
Platelets: <5 10*3/uL — CL (ref 150–400)
RBC: 1.39 MIL/uL — ABNORMAL LOW (ref 4.22–5.81)
RDW: 14.9 % (ref 11.5–15.5)
Smear Review: DECREASED
WBC: 0.7 10*3/uL — CL (ref 4.0–10.5)
nRBC: 0 % (ref 0.0–0.2)

## 2021-03-29 LAB — CBC
HCT: 13.6 % — ABNORMAL LOW (ref 39.0–52.0)
Hemoglobin: 4.9 g/dL — CL (ref 13.0–17.0)
MCH: 34 pg (ref 26.0–34.0)
MCHC: 36 g/dL (ref 30.0–36.0)
MCV: 94.4 fL (ref 80.0–100.0)
Platelets: <5 10*3/uL — CL (ref 150–400)
RBC: 1.44 MIL/uL — ABNORMAL LOW (ref 4.22–5.81)
RDW: 14.7 % (ref 11.5–15.5)
WBC: 0.9 10*3/uL — CL (ref 4.0–10.5)
nRBC: 0 % (ref 0.0–0.2)

## 2021-03-29 LAB — HEPATIC FUNCTION PANEL
ALT: 20 U/L (ref 0–44)
AST: 22 U/L (ref 15–41)
Albumin: 2.4 g/dL — ABNORMAL LOW (ref 3.5–5.0)
Alkaline Phosphatase: 101 U/L (ref 38–126)
Bilirubin, Direct: 0.6 mg/dL — ABNORMAL HIGH (ref 0.0–0.2)
Indirect Bilirubin: 1.3 mg/dL — ABNORMAL HIGH (ref 0.3–0.9)
Total Bilirubin: 1.9 mg/dL — ABNORMAL HIGH (ref 0.3–1.2)
Total Protein: 5.4 g/dL — ABNORMAL LOW (ref 6.5–8.1)

## 2021-03-29 LAB — LACTIC ACID, PLASMA
Lactic Acid, Venous: 2.6 mmol/L (ref 0.5–1.9)
Lactic Acid, Venous: 2.5 mmol/L (ref 0.5–1.9)

## 2021-03-29 LAB — PREPARE RBC (CROSSMATCH)

## 2021-03-29 LAB — POC OCCULT BLOOD, ED: Fecal Occult Bld: POSITIVE — AB

## 2021-03-29 LAB — CBG MONITORING, ED: Glucose-Capillary: 240 mg/dL — ABNORMAL HIGH (ref 70–99)

## 2021-03-29 IMAGING — DX DG CHEST 1V PORT
1 series · 1 of 1 positions shown · non-contrast
Comparison: [DATE]

CLINICAL DATA: Fever, weakness

EXAM:
PORTABLE CHEST 1 VIEW

[chest ap]
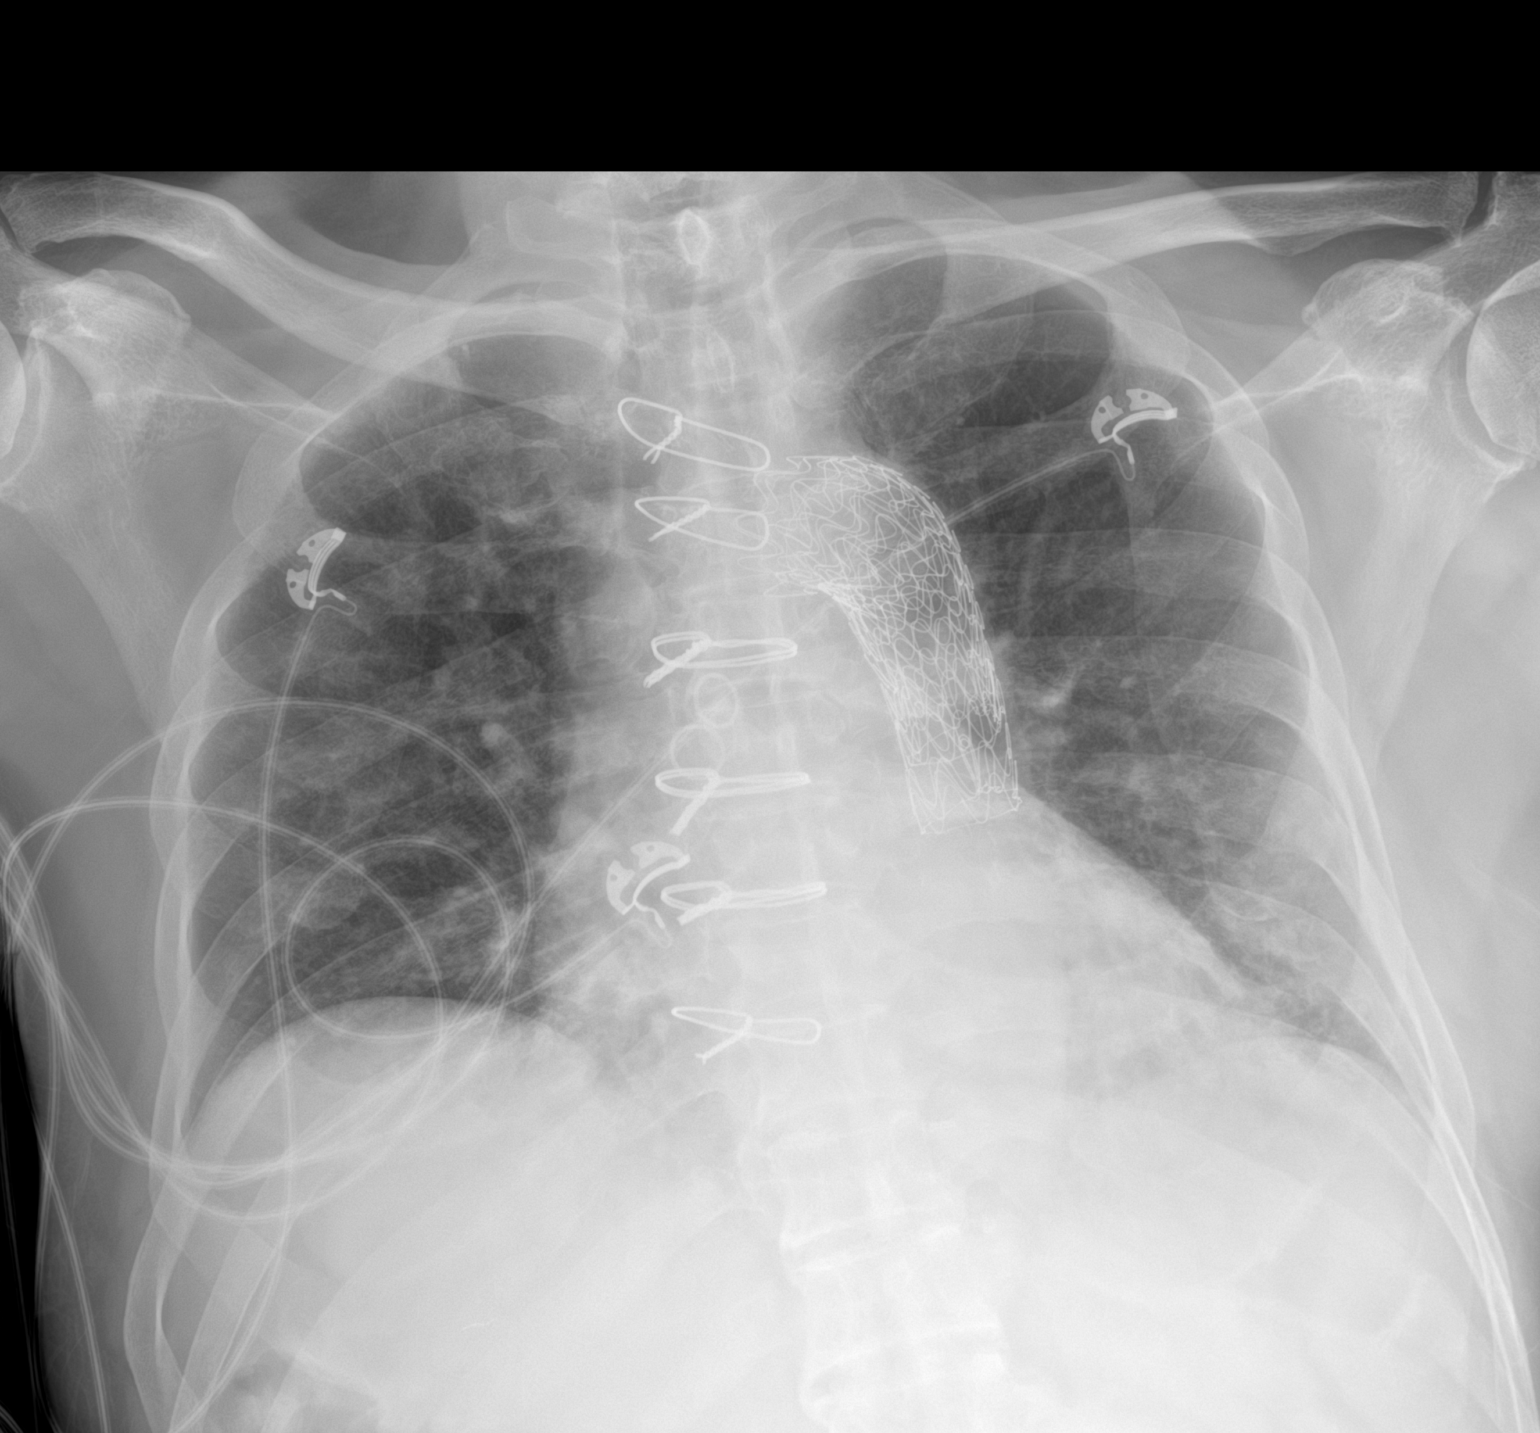

[1 of 1 positions shown; findings below may reference images not displayed]

FINDINGS: Single frontal view of the chest demonstrates stable postsurgical
changes from CABG. Endoluminal stent graft of the distal aortic arch
and descending thoracic aorta unchanged. Cardiac silhouette is
stable. No acute airspace disease, effusion, or pneumothorax. Lung
volumes are diminished with crowding of the central vasculature. No
acute bony abnormalities.
IMPRESSION: 1. Mild vascular congestion.  No acute airspace disease.

## 2021-03-29 MED ORDER — VANCOMYCIN HCL IN DEXTROSE 1-5 GM/200ML-% IV SOLN
1000.0000 mg | Freq: Once | INTRAVENOUS | Status: DC
Start: 2021-03-29 — End: 2021-03-29

## 2021-03-29 MED ORDER — CEFEPIME HCL 2 G IJ SOLR
2.0000 g | Freq: Two times a day (BID) | INTRAMUSCULAR | Status: DC
  Administered 2021-03-29: 2 g via INTRAVENOUS
  Filled 2021-03-29: qty 2

## 2021-03-29 MED ORDER — SODIUM CHLORIDE 0.9 % IV BOLUS: 1000.0000 mL | Freq: Once | INTRAVENOUS | Status: DC

## 2021-03-29 MED ORDER — SODIUM CHLORIDE 0.9 % IV SOLN
10.0000 mL/h | Freq: Once | INTRAVENOUS | Status: AC
  Administered 2021-03-29: 10 mL/h via INTRAVENOUS

## 2021-03-29 MED ORDER — VANCOMYCIN HCL 1750 MG/350ML IV SOLN
1750.0000 mg | Freq: Once | INTRAVENOUS | Status: DC
  Filled 2021-03-29: qty 350

## 2021-03-29 NOTE — Progress Notes (Signed)
Pharmacy Antibiotic Note ? ?Timothy Murray is a 76 y.o. male for which pharmacy has been consulted for cefepime dosing for bacteremia. ? ?Patient with a history of cirrhosis and hepatocellular carcinoma . Patient presenting with Fever and weakness.  ? ?Vancomycin '1750mg'$  given once in the ED. ? ?SCr 1.24 - at baseline ? ?Plan: ?Cefepime 2g q12hr ?Trend WBC, Fever, Renal function, & Clinical course ?F/u cultures, clinical course, WBC, fever ?De-escalate when able ? ?Height: 6' (182.9 cm) ?Weight: 82.6 kg (182 lb) ?IBW/kg (Calculated) : 77.6 ? ?Temp (24hrs), Avg:99.9 ?F (37.7 ?C), Min:99.6 ?F (37.6 ?C), Max:100.2 ?F (37.9 ?C) ? ?Recent Labs  ?Lab 03/23/21 ?0824 03/29/21 ?1935  ?WBC 0.8* 0.9*  ?CREATININE 1.13 1.24  ?  ?Estimated Creatinine Clearance: 56.5 mL/min (by C-G formula based on SCr of 1.24 mg/dL).   ? ?Allergies  ?Allergen Reactions  ? Dome-Paste Bandage [Wound Dressings]   ?  All bandaids cause a rash  ? Soap & Cleansers   ?  "liquid soap" antibacterial -rash  ? Latex Rash and Itching  ? ? ?Antimicrobials this admission: ?Vancomycin x 1 in the ED 3/26 >>  ?cefepime 3/26 >>  ? ?Microbiology results: ?Pending ? ?Thank you for allowing pharmacy to be a part of this patient?s care. ? ?Lorelei Pont, PharmD, BCPS ?03/29/2021 8:44 PM ?ED Clinical Pharmacist -  [phone] ?  ?

## 2021-03-29 NOTE — ED Provider Notes (Signed)
?[facility] ?Provider Note ? ? ?CSN: [id number] ?Arrival date & time: 03/29/21  7591 ? ?  ? ?History ? ?Chief Complaint  ?Patient presents with  ? Weakness  ? ? ?Timothy Murray is a 76 y.o. male history of MDS, heart failure here presenting with diffuse weakness.  Patient was recently admitted for high output heart failure secondary to anemia.  Patient was transfused and his symptoms improved.  He apparently is on hospice at home already.  He has not been doing well at home.  He has been progressively weaker and very short of breath.  He is on 2 L nasal cannula and has been short of breath despite that.  Per the family, he appears very pale.  Family noticed that he has a temperature of 102 today and gave him Tylenol prior to arrival.  Patient has been bleeding and bruising very easily.  He has some bleeding in his gums.  Denies any blood in his stool.  ? ?Per the family, they want to see if there is any further options for his MDS.  They do not want him intubated or have chest compressions ? ?The history is provided by the patient.  ? ?  ? ?Home Medications ?Prior to Admission medications   ?Medication Sig Start Date End Date Taking? Authorizing Provider  ?acetaminophen (TYLENOL) 325 MG tablet Take 2 tablets (650 mg total) by mouth every 6 (six) hours as needed for headache or mild pain. 03/17/21   Arrien, Jimmy Picket, MD  ?acyclovir (ZOVIRAX) 400 MG tablet TAKE 1 TABLET BY MOUTH TWICE A DAY 03/18/21   Verlon Au, NP  ?atorvastatin (LIPITOR) 40 MG tablet Take 40 mg by mouth daily. 06/18/20   [provider]  ?Blood Glucose Monitoring Suppl ([place]) w/Device KIT 1 each by Does not apply route in the morning and at bedtime. 02/06/21   [provider]  ?diclofenac Sodium (VOLTAREN) 1 % GEL Apply 1 application topically to affected area 4 (four) times daily. 03/17/21   Arrien, Jimmy Picket, MD  ?eltrombopag (PROMACTA) 50 MG tablet Take 2  tablets (100 mg total) by mouth daily. Take on an empty stomach 1 hour before a meal or 2 hours after 03/18/21   Sindy Guadeloupe, MD  ?FLUZONE HIGH-DOSE QUADRIVALENT 0.7 ML SUSY Inject 1 each into the skin once. 11/04/20   [provider]  ?furosemide (LASIX) 40 MG tablet Take 1 tablet (40 mg total) by mouth daily. 03/18/21 04/17/21  Arrien, Jimmy Picket, MD  ?lactulose (CHRONULAC) 10 GM/15ML solution Take 15 - 30 ml daily as needed for constipation ?Patient taking differently: Take 10 g by mouth daily as needed for moderate constipation. 02/17/21   Borders, Kirt Boys, NP  ?Lancets (ONETOUCH DELICA PLUS [id number]) MISC Apply 1 each topically 2 (two) times daily. 02/06/21   [provider]  ?levofloxacin (LEVAQUIN) 500 MG tablet TAKE 1 TABLET (500 MG TOTAL) BY MOUTH DAILY. 03/23/21   Sindy Guadeloupe, MD  ?loratadine (CLARITIN) 10 MG tablet Take 5 mg by mouth daily as needed for allergies. Take half a tablet (68m) daily    [provider]  ?metoprolol tartrate (LOPRESSOR) 25 MG tablet Take 25 mg by mouth 2 (two) times daily.    [provider]  ?nitroGLYCERIN (NITROSTAT) 0.3 MG SL tablet 0.3 mg every 5 (five) minutes as needed for chest pain.    [provider]  ?omeprazole (PRILOSEC) 40 MG capsule TAKE 1 CAPSULE BY MOUTH TWICE  A DAY ?Patient taking differently: 40 mg daily as needed (acid reflux). 11/30/20   Sindy Guadeloupe, MD  ?[facility] VERIO test strip 1 each by Other route 2 (two) times daily. 02/06/21   [provider]  ?oxyCODONE (OXY IR/ROXICODONE) 5 MG immediate release tablet Take 1 tablet (5 mg total) by mouth every 4 (four) hours as needed for severe pain. 03/17/21   Borders, Kirt Boys, NP  ?traZODone (DESYREL) 50 MG tablet TAKE 1 TO 2 TABLETS BY MOUTH AT BEDTIME ?Patient taking differently: Take 50 mg by mouth at bedtime as needed for sleep. 01/29/21   Sindy Guadeloupe, MD  ?   ? ?Allergies    ?Dome-paste bandage [wound dressings], Soap & cleansers, and Latex    ? ?Review of Systems   ?Review of Systems  ?Neurological:  Positive for weakness.  ?All other systems reviewed and are negative. ? ?Physical Exam ?Updated Vital Signs ?BP (!) 140/47   Pulse 91   Temp 98.3 ?F (36.8 ?C) (Oral)   Resp 16   Ht 6' (1.829 m)   Wt 82.6 kg   SpO2 100%   BMI 24.68 kg/m?  ?Physical Exam ?Vitals and nursing note reviewed.  ?Constitutional:   ?   Comments: Pale, chronically ill  ?HENT:  ?   Head: Normocephalic.  ?   Nose: Nose normal.  ?Eyes:  ?   Extraocular Movements: Extraocular movements intact.  ?   Comments: Conjunctiva is pale  ?Cardiovascular:  ?   Rate and Rhythm: Normal rate and regular rhythm.  ?   Pulses: Normal pulses.  ?   Heart sounds: Normal heart sounds.  ?Pulmonary:  ?   Effort: Pulmonary effort is normal.  ?   Breath sounds: Normal breath sounds.  ?Abdominal:  ?   General: Abdomen is flat.  ?   Palpations: Abdomen is soft.  ?Musculoskeletal:     ?   General: Normal range of motion.  ?   Cervical back: Normal range of motion and neck supple.  ?Skin: ?   General: Skin is warm.  ?   Capillary Refill: Capillary refill takes less than 2 seconds.  ?   Comments: Patient has multiple bruises on his extremities and also on his tongue  ?Psychiatric:     ?   Mood and Affect: Mood normal.     ?   Behavior: Behavior normal.  ? ? ?ED Results / Procedures / Treatments   ?Labs ?(all labs ordered are listed, but only abnormal results are displayed) ?Labs Reviewed  ?BASIC METABOLIC PANEL - Abnormal; Notable for the following components:  ?    Result Value  ? Sodium 132 (*)   ? Chloride 96 (*)   ? Glucose, Bld 233 (*)   ? Calcium 8.5 (*)   ? All other components within normal limits  ?CBC - Abnormal; Notable for the following components:  ? WBC 0.9 (*)   ? RBC 1.44 (*)   ? Hemoglobin 4.9 (*)   ? HCT 13.6 (*)   ? Platelets <5 (*)   ? All other components within normal limits  ?LACTIC ACID, PLASMA - Abnormal; Notable for the following components:  ? Lactic Acid, Venous 2.6 (*)   ? All  other components within normal limits  ?LACTIC ACID, PLASMA - Abnormal; Notable for the following components:  ? Lactic Acid, Venous 2.5 (*)   ? All other components within normal limits  ?HEPATIC FUNCTION PANEL - Abnormal; Notable for the following components:  ? Total  Protein 5.4 (*)   ? Albumin 2.4 (*)   ? Total Bilirubin 1.9 (*)   ? Bilirubin, Direct 0.6 (*)   ? Indirect Bilirubin 1.3 (*)   ? All other components within normal limits  ?CBC WITH DIFFERENTIAL/PLATELET - Abnormal; Notable for the following components:  ? WBC 0.7 (*)   ? RBC 1.39 (*)   ? Hemoglobin 4.7 (*)   ? HCT 13.2 (*)   ? Platelets <5 (*)   ? Neutro Abs 0.3 (*)   ? Lymphs Abs 0.3 (*)   ? All other components within normal limits  ?CBG MONITORING, ED - Abnormal; Notable for the following components:  ? Glucose-Capillary 240 (*)   ? All other components within normal limits  ?POC OCCULT BLOOD, ED - Abnormal; Notable for the following components:  ? Fecal Occult Bld POSITIVE (*)   ? All other components within normal limits  ?CULTURE, BLOOD (ROUTINE X 2)  ?CULTURE, BLOOD (ROUTINE X 2)  ?RESP PANEL BY RT-PCR (FLU A&B, COVID) ARPGX2  ?URINALYSIS, ROUTINE W REFLEX MICROSCOPIC  ?BLOOD GAS, VENOUS  ?PATHOLOGIST SMEAR REVIEW  ?TYPE AND SCREEN  ?PREPARE RBC (CROSSMATCH)  ?PREPARE PLATELET PHERESIS  ? ? ?EKG ?None ? ?Radiology ?DG Chest Port 1 View ? ?Result Date: 03/29/2021 ?CLINICAL DATA:  Fever, weakness EXAM: PORTABLE CHEST 1 VIEW COMPARISON:  03/13/2021 FINDINGS: Single frontal view of the chest demonstrates stable postsurgical changes from CABG. Endoluminal stent graft of the distal aortic arch and descending thoracic aorta unchanged. Cardiac silhouette is stable. No acute airspace disease, effusion, or pneumothorax. Lung volumes are diminished with crowding of the central vasculature. No acute bony abnormalities. IMPRESSION: 1. Mild vascular congestion.  No acute airspace disease. Electronically Signed   By: Randa Ngo M.D.   On: 03/29/2021  21:31   ? ?Procedures ?Procedures  ? ? ?CRITICAL CARE ?Performed by: Wandra Arthurs ? ? ?Total critical care time: 30 minutes ? ?Critical care time was exclusive of separately billable procedures and treating other patients.

## 2021-03-29 NOTE — H&P (Signed)
?History and Physical  ? ? ?Patient: Timothy Murray MRN: [id number] DOA: 03/29/2021 ? ?Date of Service: the patient was seen and examined on 03/30/2021 ? ?Patient coming from: Home ? ?Chief Complaint:  ?Chief Complaint  ?Patient presents with  ? Weakness  ? ? ?HPI:  ? ?76 year old male with past medical history of myelodysplastic syndrome with chronic pancytopenia, CAD (S/P CABG 2014), Thoracic aortic ulceration (S/P endovascular repair 12/2017),  cirrhosis of the liver, diabetes mellitus type 2, hypertension, hepatocellular carcinoma (S/P proton beam radiation 06/2019), gastroesophageal reflux disease, hyperlipidemia who presents to [facility] emergency department with complaints of fevers and weakness. ? ?Of note, patient was recently hospitalized at [facility] from 3/10 until 3/14.  Patient was hospitalized for acute high output congestive heart failure due to ongoing severe anemia.  During the hospitalization patient responded to intravenous Lasix in addition to receiving a packed red blood cell transfusion.  Patient did clinically improve somewhat and was eventually discharged on 3/14.  In the weeks that followed patient has been following with his oncologist Dr. Janese Banks in the outpatient setting.  Due to patient's progressive clinical decline possibility of hospice has been discussed with the patient and family.  They were agreeable to a referral for home initiation of hospice. ? ?However, in the past 24 hours the patient's generalized weakness has worsened even further and is severe in intensity.  Patient is complaining of associated shortness of breath that is worse with exertion and improved with rest.  This is associated with bouts of fever over 102 ?F.  Over the same span of time patient has been exhibiting easy bruising as well as gingival bleeding as well.  Patient denies any associated chest pain.  Patient denies any recent travel or sick contacts.  Patient denies any contact with confirmed  COVID-19 infection.   ? ?Due to family concerns over the fever the patient was brought into [facility] emergency department for evaluation. ? ?Upon evaluation in the emergency department CBC reveals profound leukopenia with white blood cell count of 0.7, severe anemia with hemoglobin of 4.7 and platelet count of less than 5.  Stool Hemoccult was found to be positive.  Lactic acid was found to be elevated at 2.6.  ER provider discussed case with Dr. Chryl Heck with oncology who recommended initiation of 2 units of packed red blood cell transfusion in addition to ministration of a platelet transfusion as well.  They stated that they would evaluate the patient in the morning in consultation.  ER provider additionally administered intravenous vancomycin and cefepime.  The hospitalist group was then called to assess the patient for admission to the hospital. ? ?Review of Systems: Review of Systems  ?Constitutional:  Positive for malaise/fatigue.  ?Neurological:  Positive for dizziness and weakness.  ?Endo/Heme/Allergies:  Bruises/bleeds easily.  ?All other systems reviewed and are negative. ? ? ?Past Medical History:  ?Diagnosis Date  ? Arthritis   ? "lower back" (10/24/2012)  ? Coronary artery disease   ? GERD (gastroesophageal reflux disease)   ? High cholesterol   ? Hypertension   ? Liver cancer ([place])   ? MDS (myelodysplastic syndrome) ([place])   ? Type II diabetes mellitus ([place])   ? ? ?Past Surgical History:  ?Procedure Laterality Date  ? APPENDECTOMY  2002  ? CARDIAC CATHETERIZATION  10/24/2012  ? CORONARY ARTERY BYPASS GRAFT N/A 10/26/2012  ? Procedure: CORONARY ARTERY BYPASS GRAFTING (CABG);  Surgeon: Melrose Nakayama, MD;  Location: [facility];  Service: Open Heart  Surgery;  Laterality: N/A;  Times 3 using left internal mammary artery and endoscopically harvested right saphenous vein  ? ERCP N/A 04/25/2020  ? Procedure: ENDOSCOPIC RETROGRADE CHOLANGIOPANCREATOGRAPHY (ERCP);  Surgeon: Lucilla Lame, MD;  Location:  [facility] ENDOSCOPY;  Service: Endoscopy;  Laterality: N/A;  ? IR BONE MARROW BIOPSY & ASPIRATION  01/26/2021  ? ? ?Social History:  reports that he quit smoking about 19 years ago. His smoking use included cigarettes. He has a 15.00 pack-year smoking history. He has never used smokeless tobacco. He reports that he does not drink alcohol and does not use drugs. ? ?Allergies  ?Allergen Reactions  ? Dome-Paste Bandage [Wound Dressings]   ?  All bandaids cause a rash  ? Soap & Cleansers   ?  "liquid soap" antibacterial -rash  ? Latex Rash and Itching  ? ? ?Family History  ?Problem Relation Age of Onset  ? Cancer Sister   ? ? ?Prior to Admission medications   ?Medication Sig Start Date End Date Taking? Authorizing Provider  ?acetaminophen (TYLENOL) 325 MG tablet Take 2 tablets (650 mg total) by mouth every 6 (six) hours as needed for headache or mild pain. 03/17/21   Arrien, Jimmy Picket, MD  ?acyclovir (ZOVIRAX) 400 MG tablet TAKE 1 TABLET BY MOUTH TWICE A DAY 03/18/21   Verlon Au, NP  ?atorvastatin (LIPITOR) 40 MG tablet Take 40 mg by mouth daily. 06/18/20   [provider]  ?Blood Glucose Monitoring Suppl ([place]) w/Device KIT 1 each by Does not apply route in the morning and at bedtime. 02/06/21   [provider]  ?diclofenac Sodium (VOLTAREN) 1 % GEL Apply 1 application topically to affected area 4 (four) times daily. 03/17/21   Arrien, Jimmy Picket, MD  ?eltrombopag (PROMACTA) 50 MG tablet Take 2 tablets (100 mg total) by mouth daily. Take on an empty stomach 1 hour before a meal or 2 hours after 03/18/21   Sindy Guadeloupe, MD  ?FLUZONE HIGH-DOSE QUADRIVALENT 0.7 ML SUSY Inject 1 each into the skin once. 11/04/20   [provider]  ?furosemide (LASIX) 40 MG tablet Take 1 tablet (40 mg total) by mouth daily. 03/18/21 04/17/21  Arrien, Jimmy Picket, MD  ?lactulose (CHRONULAC) 10 GM/15ML solution Take 15 - 30 ml daily as needed for constipation ?Patient taking differently:  Take 10 g by mouth daily as needed for moderate constipation. 02/17/21   Borders, Kirt Boys, NP  ?Lancets (ONETOUCH DELICA PLUS [id number]) MISC Apply 1 each topically 2 (two) times daily. 02/06/21   [provider]  ?levofloxacin (LEVAQUIN) 500 MG tablet TAKE 1 TABLET (500 MG TOTAL) BY MOUTH DAILY. 03/23/21   Sindy Guadeloupe, MD  ?loratadine (CLARITIN) 10 MG tablet Take 5 mg by mouth daily as needed for allergies. Take half a tablet (78m) daily    [provider]  ?metoprolol tartrate (LOPRESSOR) 25 MG tablet Take 25 mg by mouth 2 (two) times daily.    [provider]  ?nitroGLYCERIN (NITROSTAT) 0.3 MG SL tablet 0.3 mg every 5 (five) minutes as needed for chest pain.    [provider]  ?omeprazole (PRILOSEC) 40 MG capsule TAKE 1 CAPSULE BY MOUTH TWICE A DAY ?Patient taking differently: 40 mg daily as needed (acid reflux). 11/30/20   RSindy Guadeloupe MD  ?[facility] test strip 1 each by Other route 2 (two) times daily. 02/06/21   [provider]  ?oxyCODONE (OXY IR/ROXICODONE) 5 MG immediate release tablet Take 1 tablet (5 mg total)  by mouth every 4 (four) hours as needed for severe pain. 03/17/21   Borders, Kirt Boys, NP  ?traZODone (DESYREL) 50 MG tablet TAKE 1 TO 2 TABLETS BY MOUTH AT BEDTIME ?Patient taking differently: Take 50 mg by mouth at bedtime as needed for sleep. 01/29/21   Sindy Guadeloupe, MD  ? ? ?Physical Exam: ? ?Vitals:  ? 03/30/21 0200 03/30/21 0246 03/30/21 0248 03/30/21 0304  ?BP: (!) 136/57 (!) 123/44 (!) 117/43 (!) 119/42  ?Pulse: 95 87 90 90  ?Resp: (!) 22 19  20   ?Temp:  97.8 ?F (36.6 ?C) 97.8 ?F (36.6 ?C) 97.8 ?F (36.6 ?C)  ?TempSrc:  Oral Oral Oral  ?SpO2: 100% 98% 99% 99%  ?Weight:      ?Height:      ? ? ?Constitutional: Lethargic but arousable, oriented x 3,   NAD ?Skin: Notably increased skin pallor.  Notable petechia and small ecchymoses noted over the extremities.   ?Eyes: Pupils are equally reactive to light.  No evidence of scleral icterus or  conjunctival pallor.  ?ENMT: Moist mucous membranes noted.  Posterior pharynx clear of any exudate or lesions.   ?Neck: normal, supple, no masses, no thyromegaly.  No evidence of jugular venous distension

## 2021-03-29 NOTE — ED Notes (Signed)
Patient informed that urine is needed for UA, states not able at this time. ?

## 2021-03-29 NOTE — ED Triage Notes (Signed)
Pt arrives to ED POV c/o Fever and weakness. Per pt he had a fever of 104 at the house. Pt having a difficult time answering questions, states he is weak and SHOB. Pt on 2L [place] at all times. Hx of Liver Cancer and CABG. ?

## 2021-03-30 ENCOUNTER — Encounter (HOSPITAL_COMMUNITY): Payer: Self-pay | Admitting: Internal Medicine

## 2021-03-30 DIAGNOSIS — D61818 Other pancytopenia: Principal | ICD-10-CM

## 2021-03-30 DIAGNOSIS — D469 Myelodysplastic syndrome, unspecified: Secondary | ICD-10-CM

## 2021-03-30 DIAGNOSIS — Z66 Do not resuscitate: Secondary | ICD-10-CM

## 2021-03-30 DIAGNOSIS — D62 Acute posthemorrhagic anemia: Secondary | ICD-10-CM

## 2021-03-30 DIAGNOSIS — Z515 Encounter for palliative care: Secondary | ICD-10-CM

## 2021-03-30 DIAGNOSIS — Z7189 Other specified counseling: Secondary | ICD-10-CM

## 2021-03-30 LAB — GLUCOSE, CAPILLARY
Glucose-Capillary: 177 mg/dL — ABNORMAL HIGH (ref 70–99)
Glucose-Capillary: 170 mg/dL — ABNORMAL HIGH (ref 70–99)
Glucose-Capillary: 187 mg/dL — ABNORMAL HIGH (ref 70–99)
Glucose-Capillary: 122 mg/dL — ABNORMAL HIGH (ref 70–99)

## 2021-03-30 LAB — COMPREHENSIVE METABOLIC PANEL
ALT: 18 U/L (ref 0–44)
AST: 19 U/L (ref 15–41)
Albumin: 2.2 g/dL — ABNORMAL LOW (ref 3.5–5.0)
Alkaline Phosphatase: 77 U/L (ref 38–126)
Anion gap: 10 (ref 5–15)
BUN: 22 mg/dL (ref 8–23)
CO2: 25 mmol/L (ref 22–32)
Calcium: 8.3 mg/dL — ABNORMAL LOW (ref 8.9–10.3)
Chloride: 99 mmol/L (ref 98–111)
Creatinine, Ser: 1.11 mg/dL (ref 0.61–1.24)
GFR, Estimated: >60 mL/min (ref >60)
Glucose, Bld: 268 mg/dL — ABNORMAL HIGH (ref 70–99)
Potassium: 3.8 mmol/L (ref 3.5–5.1)
Sodium: 134 mmol/L — ABNORMAL LOW (ref 135–145)
Total Bilirubin: 1.6 mg/dL — ABNORMAL HIGH (ref 0.3–1.2)
Total Protein: 4.8 g/dL — ABNORMAL LOW (ref 6.5–8.1)

## 2021-03-30 LAB — RESP PANEL BY RT-PCR (FLU A&B, COVID) ARPGX2
Influenza A by PCR: NEGATIVE
Influenza B by PCR: NEGATIVE
SARS Coronavirus 2 by RT PCR: NEGATIVE

## 2021-03-30 LAB — CBC
HCT: 18.1 % — ABNORMAL LOW (ref 39.0–52.0)
HCT: 17.3 % — ABNORMAL LOW (ref 39.0–52.0)
Hemoglobin: 6.5 g/dL — CL (ref 13.0–17.0)
Hemoglobin: 6.3 g/dL — CL (ref 13.0–17.0)
MCH: 33.2 pg (ref 26.0–34.0)
MCH: 33.3 pg (ref 26.0–34.0)
MCHC: 35.9 g/dL (ref 30.0–36.0)
MCHC: 36.4 g/dL — ABNORMAL HIGH (ref 30.0–36.0)
MCV: 92.3 fL (ref 80.0–100.0)
MCV: 91.5 fL (ref 80.0–100.0)
Platelets: 25 10*3/uL — CL (ref 150–400)
Platelets: 46 10*3/uL — ABNORMAL LOW (ref 150–400)
RBC: 1.96 MIL/uL — ABNORMAL LOW (ref 4.22–5.81)
RBC: 1.89 MIL/uL — ABNORMAL LOW (ref 4.22–5.81)
RDW: 14.4 % (ref 11.5–15.5)
RDW: 14.7 % (ref 11.5–15.5)
WBC: 0.7 10*3/uL — CL (ref 4.0–10.5)
WBC: 0.7 10*3/uL — CL (ref 4.0–10.5)
nRBC: 0 % (ref 0.0–0.2)
nRBC: 0 % (ref 0.0–0.2)

## 2021-03-30 LAB — DIC (DISSEMINATED INTRAVASCULAR COAGULATION)PANEL
D-Dimer, Quant: 4.48 ug/mL-FEU — ABNORMAL HIGH (ref 0.00–0.50)
Fibrinogen: 575 mg/dL — ABNORMAL HIGH (ref 210–475)
INR: 1.3 — ABNORMAL HIGH (ref 0.8–1.2)
Platelets: 41 10*3/uL — ABNORMAL LOW (ref 150–400)
Prothrombin Time: 16 seconds — ABNORMAL HIGH (ref 11.4–15.2)
Smear Review: NONE SEEN
aPTT: 36 seconds (ref 24–36)

## 2021-03-30 LAB — URINALYSIS, ROUTINE W REFLEX MICROSCOPIC
Bilirubin Urine: NEGATIVE
Glucose, UA: NEGATIVE mg/dL
Hgb urine dipstick: NEGATIVE
Ketones, ur: NEGATIVE mg/dL
Leukocytes,Ua: NEGATIVE
Nitrite: NEGATIVE
Protein, ur: NEGATIVE mg/dL
Specific Gravity, Urine: 1.015 (ref 1.005–1.030)
pH: 5 (ref 5.0–8.0)

## 2021-03-30 LAB — PATHOLOGIST SMEAR REVIEW

## 2021-03-30 LAB — PREPARE RBC (CROSSMATCH)

## 2021-03-30 LAB — CBG MONITORING, ED
Glucose-Capillary: 189 mg/dL — ABNORMAL HIGH (ref 70–99)
Glucose-Capillary: 120 mg/dL — ABNORMAL HIGH (ref 70–99)

## 2021-03-30 LAB — LACTATE DEHYDROGENASE: LDH: 295 U/L — ABNORMAL HIGH (ref 98–192)

## 2021-03-30 LAB — MAGNESIUM: Magnesium: 1.8 mg/dL (ref 1.7–2.4)

## 2021-03-30 MED ORDER — PANTOPRAZOLE SODIUM 40 MG IV SOLR
40.0000 mg | Freq: Two times a day (BID) | INTRAVENOUS | Status: DC
  Administered 2021-03-30 – 2021-03-31 (×4): 40 mg via INTRAVENOUS
  Filled 2021-03-30 (×4): qty 10

## 2021-03-30 MED ORDER — ACETAMINOPHEN 325 MG PO TABS
650.0000 mg | ORAL_TABLET | Freq: Four times a day (QID) | ORAL | Status: DC | PRN
  Administered 2021-03-30 – 2021-03-31 (×4): 650 mg via ORAL
  Filled 2021-03-30 (×4): qty 2

## 2021-03-30 MED ORDER — ATORVASTATIN CALCIUM 40 MG PO TABS
40.0000 mg | ORAL_TABLET | Freq: Every day | ORAL | Status: DC
  Administered 2021-03-30 – 2021-03-31 (×2): 40 mg via ORAL
  Filled 2021-03-30 (×2): qty 1

## 2021-03-30 MED ORDER — SODIUM CHLORIDE 0.9% IV SOLUTION: Freq: Once | INTRAVENOUS | Status: AC

## 2021-03-30 MED ORDER — ONDANSETRON HCL 4 MG/2ML IJ SOLN: 4.0000 mg | Freq: Four times a day (QID) | INTRAMUSCULAR | Status: DC | PRN

## 2021-03-30 MED ORDER — SODIUM CHLORIDE 0.9 % IV SOLN
2.0000 g | Freq: Three times a day (TID) | INTRAVENOUS | Status: DC
  Administered 2021-03-30 – 2021-03-31 (×4): 2 g via INTRAVENOUS
  Filled 2021-03-30 (×4): qty 2

## 2021-03-30 MED ORDER — PANTOPRAZOLE SODIUM 40 MG PO TBEC: 40.0000 mg | DELAYED_RELEASE_TABLET | Freq: Every day | ORAL | Status: DC

## 2021-03-30 MED ORDER — NITROGLYCERIN 0.4 MG SL SUBL: 0.4000 mg | SUBLINGUAL_TABLET | SUBLINGUAL | Status: DC | PRN

## 2021-03-30 MED ORDER — HYDRALAZINE HCL 20 MG/ML IJ SOLN: 10.0000 mg | Freq: Four times a day (QID) | INTRAMUSCULAR | Status: DC | PRN

## 2021-03-30 MED ORDER — ACETAMINOPHEN 650 MG RE SUPP
650.0000 mg | Freq: Four times a day (QID) | RECTAL | Status: DC | PRN
  Filled 2021-03-30: qty 1

## 2021-03-30 MED ORDER — ONDANSETRON HCL 4 MG PO TABS: 4.0000 mg | ORAL_TABLET | Freq: Four times a day (QID) | ORAL | Status: DC | PRN

## 2021-03-30 MED ORDER — ACYCLOVIR 400 MG PO TABS
400.0000 mg | ORAL_TABLET | Freq: Two times a day (BID) | ORAL | Status: DC
Start: 2021-03-30 — End: 2021-03-31
  Administered 2021-03-30 – 2021-03-31 (×2): 400 mg via ORAL
  Filled 2021-03-30 (×5): qty 1

## 2021-03-30 MED ORDER — LORATADINE 10 MG PO TABS: 5.0000 mg | ORAL_TABLET | Freq: Every day | ORAL | Status: DC | PRN

## 2021-03-30 MED ORDER — FUROSEMIDE 10 MG/ML IJ SOLN
20.0000 mg | Freq: Once | INTRAMUSCULAR | Status: AC
  Administered 2021-03-30: 20 mg via INTRAVENOUS
  Filled 2021-03-30: qty 2

## 2021-03-30 MED ORDER — TRAZODONE HCL 50 MG PO TABS
50.0000 mg | ORAL_TABLET | Freq: Every evening | ORAL | Status: DC | PRN
  Administered 2021-03-30: 50 mg via ORAL
  Filled 2021-03-30: qty 1

## 2021-03-30 MED ORDER — INSULIN ASPART 100 UNIT/ML IJ SOLN
0.0000 [IU] | Freq: Three times a day (TID) | INTRAMUSCULAR | Status: DC
  Administered 2021-03-30: 2 [IU] via SUBCUTANEOUS
  Administered 2021-03-30 – 2021-03-31 (×4): 3 [IU] via SUBCUTANEOUS

## 2021-03-30 MED ORDER — METOPROLOL TARTRATE 25 MG PO TABS
25.0000 mg | ORAL_TABLET | Freq: Two times a day (BID) | ORAL | Status: DC
Start: 2021-03-30 — End: 2021-03-31
  Administered 2021-03-30: 25 mg via ORAL
  Filled 2021-03-30 (×3): qty 1

## 2021-03-30 MED ORDER — ENSURE ENLIVE PO LIQD
237.0000 mL | Freq: Two times a day (BID) | ORAL | Status: DC
  Administered 2021-03-30 – 2021-03-31 (×2): 237 mL via ORAL

## 2021-03-30 MED ORDER — OXYCODONE HCL 5 MG PO TABS
5.0000 mg | ORAL_TABLET | ORAL | Status: DC | PRN
  Administered 2021-03-30 – 2021-03-31 (×8): 5 mg via ORAL
  Filled 2021-03-30 (×9): qty 1

## 2021-03-30 MED ORDER — POLYETHYLENE GLYCOL 3350 17 G PO PACK: 17.0000 g | PACK | Freq: Every day | ORAL | Status: DC | PRN

## 2021-03-30 NOTE — Assessment & Plan Note (Signed)
?   Patient presenting with white blood cell count of 0.7 with 41% neutrophils revealing an absolute neutrophil count of less than 500 ?? Patient additionally recurrent bouts of fever in the past 24 hours according to family ?? No definitive evidence of infection on initial work-up ?? Considering patient is typically on prophylaxis with oral levofloxacin in outpatient setting, treating patient currently with intravenous cefepime ?? Urinalysis has been ordered ?? Blood cultures have been obtained ?? COVID-19 PCR testing is pending ?? ER provider has already discussed case with Dr. Chryl Heck with hematology/oncology who will evaluate patient in the morning in consultation ?

## 2021-03-30 NOTE — Assessment & Plan Note (Signed)
?   Providing patient with IV PPI for now until acute on chronic anemia stabilized as mentioned above ?

## 2021-03-30 NOTE — Assessment & Plan Note (Addendum)
?   Patient is currently chest pain free ?? Monitoring patient on telemetry ?? Continue home regimen of lipid lowering therapy and AV nodal blocking therapy ?? Holding any antiplatelet therapy ? ?

## 2021-03-30 NOTE — Assessment & Plan Note (Addendum)
?   Patient presenting with progressively worsening severe weakness with easy bruising gingival bleeding and a dramatic drop in hemoglobin down to 4.7 from 8.1 one week prior. ?? Stool Hemoccult positive although there is no evidence of gross bleeding here in the emergency department ?? Patient is likely suffering from chronic bleeding exacerbated by profound thrombocytopenia with platelet count is currently less than 5 ?? ER provider has already discussed case with Dr. Chryl Heck with hematology who is recommending initiation of 2 unit packed red blood cell transfusion with target hemoglobin of greater than 7 in addition to packed platelet transfusion. ?? While I am not aware of a singular source of the bleeding we will additionally provide patient with Protonix 40 mg IV every 12 for now until blood counts can be stabilized after which patient can be transitioned back to oral PPI ?? I do not believe that a gastroenterology consultation is indicated at this time as there is no gross evidence of GI bleeding and endoscopic theraputic options would be limited.   ?

## 2021-03-30 NOTE — Assessment & Plan Note (Signed)
?   Patient been placed on Accu-Cheks before every meal and nightly with sliding scale insulin ?? Hemoglobin A1C 7.4% in early March ?? Diabetic Diet ? ?

## 2021-03-30 NOTE — Progress Notes (Addendum)
?                                  PROGRESS NOTE                                             ?                                                                                                                     ?                                         ? ? Patient Demographics:  ? ? Timothy Murray, is a 76 y.o. male, DOB - 08-24-1945, FIE:[id number] ? ?Outpatient Primary MD for the patient is Timothy Maltese, MD    LOS - 1  Admit date - 03/29/2021   ? ?Chief Complaint  ?Patient presents with  ? Weakness  ?    ? ?Brief Narrative (HPI from H&P)   76 year old Hispanic male with history of myelodysplastic syndrome with chronic pancytopenia under the care of Dr. Oval Murray also second opinion with Dr. Marvel Murray at [facility] referred recently to home hospice, history of hepatocellular carcinoma s/p proton beam radiation in 2021, CAD s/p CABG in 2014, thoracic aortic ulceration s/p endovascular repair in 2019, liver cirrhosis, DM type II, essential hypertension, GERD, dyslipidemia who was recently admitted to [facility] few weeks ago for CHF now presents to the hospital with weakness and subjective fevers.  In the ER he was diagnosed with severe pancytopenia including severe anemia and thrombocytopenia and was admitted to the hospital for further care. ? ? Subjective:  ? ? Timothy Murray today has, No headache, No chest pain, No abdominal pain - No Nausea, No new weakness tingling or numbness, no SOB ? ? Assessment  & Murray :  ? ? ?Myelodysplastic syndrome with severe underlying pancytopenia now with acute on chronic anemia with heme positive stool but no frank bleeding, severe thrombocytopenia and subjective neutropenic fevers none in the hospital but some at home -  ? ? ?He is s/p 2 units of packed RBC transfusion at the time of admission along with 6 packs of platelet transfusion, he was weakly Hemoccult positive but no signs of frank bleeding, he likely lost some  blood in his GI tract due to severe thrombocytopenia on top of his chronic severe anemia due to underlying pancytopenia from myelodysplastic syndrome.  He has been placed on IV PPI, currently H&H seems to be stable and will be monitored, if required more transfusions will be provided.  We will also check DIC panel along with LDH and haptoglobin.  He is a very poor candidate for EGD or  colonoscopy.  He has already been referred to home hospice who have seen the patient once at home, goal is supportive care.  Palliative care will also see the patient.  Case was discussed with hematology oncology Dr. Chryl Murray and Dr. Betsy Murray they have nothing else to offer at this time, will see if they can provide an opinion to the family. ? ?After I saw the patient family request to seek another opinion at [place], [place] transfer center was called and they did not accept the patient as he does not meet criteria for higher level of care and they have nothing else to offer at this time.  I have requested our oncology team to provide an opinion as well. ? ? ?Febrile neutropenia.  Some subjective fevers at home.  No clear source of infection.  For now IV Maxipime will be continued follow cultures closely. ? ?Hypertension.  On beta-blocker. ? ?GERD.  For now IV PPI ? ?Lactic acidosis.  Likely due to hypoperfusion, no fevers here no signs of frank sepsis. ? ?CAD s/p CABG.  Supportive care with statin and beta-blocker.   ? ?History of liver cirrhosis, hepatocellular carcinoma, supportive care.   ? ?DM type II.  For now sliding scale. ? ?Lab Results  ?Component Value Date  ? HGBA1C 7.4 (H) 03/14/2021  ? ?CBG (last 3)  ?Recent Labs  ?  03/29/21 ?2222 03/30/21 ?0807  ?GLUCAP 240* 189*  ? ? ? ?   ? ?Condition - Extremely Guarded ? ?Family Communication  :  wife bedside 03/30/21 ? ?Code Status :  DNR ? ?Consults  : Oncology Dr. Chryl Murray and Dr Timothy Murray .  Palliative care, also discussed with Timothy Murray transfer center ? ?PUD Prophylaxis : PPI ? ?  Procedures  :    ? ? ?   ? ?Disposition Murray  :   ? ?Status is: Inpatient ? ?DVT Prophylaxis  :   ? ?SCDs Start: 03/30/21 0002 ?  ? ?Lab Results  ?Component Value Date  ? PLT 25 (LL) 03/30/2021  ? ? ?Diet :  ?Diet Order   ? ?       ?  Diet heart healthy/carb modified Room service appropriate? Yes; Fluid consistency: Thin  Diet effective now       ?  ? ?  ?  ? ?  ?  ? ?Inpatient Medications ? ?Scheduled Meds: ? acyclovir  400 mg Oral BID  ? atorvastatin  40 mg Oral Daily  ? insulin aspart  0-15 Units Subcutaneous TID AC & HS  ? metoprolol tartrate  25 mg Oral BID  ? pantoprazole (PROTONIX) IV  40 mg Intravenous Q12H  ? ?Continuous Infusions: ? ceFEPime (MAXIPIME) IV Stopped (03/30/21 7829)  ? ?PRN Meds:.acetaminophen **OR** acetaminophen, hydrALAZINE, loratadine, nitroGLYCERIN, ondansetron **OR** ondansetron (ZOFRAN) IV, oxyCODONE, polyethylene glycol, traZODone ? ?Antibiotics  :   ? ?Anti-infectives (From admission, onward)  ? ? Start     Dose/Rate Route Frequency Ordered Stop  ? 03/30/21 1000  acyclovir (ZOVIRAX) tablet 400 mg       ? 400 mg Oral 2 times daily 03/30/21 0002    ? 03/30/21 0730  ceFEPIme (MAXIPIME) 2 g in sodium chloride 0.9 % 100 mL IVPB       ? 2 g ?200 mL/hr over 30 Minutes Intravenous Every 8 hours 03/30/21 0722    ? 03/29/21 2200  ceFEPIme (MAXIPIME) 2 g in sodium chloride 0.9 % 100 mL IVPB  Status:  Discontinued       ? 2  g ?200 mL/hr over 30 Minutes Intravenous Every 12 hours 03/29/21 2044 03/30/21 0722  ? 03/29/21 2045  vancomycin (VANCOCIN) IVPB 1000 mg/200 mL premix  Status:  Discontinued       ? 1,000 mg ?200 mL/hr over 60 Minutes Intravenous  Once 03/29/21 2034 03/29/21 2044  ? 03/29/21 2045  vancomycin (VANCOREADY) IVPB 1750 mg/350 mL  Status:  Discontinued       ? 1,750 mg ?175 mL/hr over 120 Minutes Intravenous  Once 03/29/21 2044 03/30/21 0003  ? ?  ? ? ? Time Spent in minutes  30 ? ? ?Lala Lund M.D on 03/30/2021 at 11:14 AM ? ?To page go to [email or web address]  ? ?[facility] -   Office  [phone] ? ?See all Orders from today for further details ? ? ? Objective:  ? ?Vitals:  ? 03/30/21 0700 03/30/21 0915 03/30/21 1000 03/30/21 1015  ?BP: (!) 115/52  (!) 128/49 (!) 104/51  ?Pulse: 90 81 79 80  ?Resp: (!) 21 17 (!) 24 20  ?Temp:      ?TempSrc:      ?SpO2: 96% 100% 100% 100%  ?Weight:      ?Height:      ? ? ?Wt Readings from Last 3 Encounters:  ?03/29/21 82.6 kg  ?03/23/21 86.5 kg  ?03/17/21 84.6 kg  ? ? ? ?Intake/Output Summary (Last 24 hours) at 03/30/2021 1114 ?Last data filed at 03/30/2021 9485 ?Gross per 24 hour  ?Intake 2399 ml  ?Output --  ?Net 2399 ml  ? ? ? ?Physical Exam ? ?Awake Alert, No new F.N deficits, Normal affect ?[place].AT,PERRAL ?Supple Neck, No JVD,   ?Symmetrical Chest wall movement, Good air movement bilaterally, CTAB ?RRR,No Gallops,Rubs or new Murmurs,  ?+ve B.Sounds, Abd Soft, No tenderness,   ?No Cyanosis, Clubbing or edema  ?  ? ?RN pressure injury documentation: ?  ? ? Data Review:  ? ? ?CBC ?Recent Labs  ?Lab 03/29/21 ?1935 03/29/21 ?2135 03/30/21 ?0416  ?WBC 0.9* 0.7* 0.7*  ?HGB 4.9* 4.7* 6.5*  ?HCT 13.6* 13.2* 18.1*  ?PLT <5* <5* 25*  ?MCV 94.4 95.0 92.3  ?MCH 34.0 33.8 33.2  ?MCHC 36.0 35.6 35.9  ?RDW 14.7 14.9 14.4  ?LYMPHSABS  --  0.3*  --   ?MONOABS  --  0.1  --   ?EOSABS  --  0.0  --   ?BASOSABS  --  0.0  --   ? ? ?Electrolytes ?Recent Labs  ?Lab 03/29/21 ?1935 03/29/21 ?2135 03/29/21 ?2216 03/30/21 ?0416  ?NA 132*  --   --  134*  ?K 4.0  --   --  3.8  ?CL 96*  --   --  99  ?CO2 25  --   --  25  ?GLUCOSE 233*  --   --  268*  ?BUN 22  --   --  22  ?CREATININE 1.24  --   --  1.11  ?CALCIUM 8.5*  --   --  8.3*  ?AST  --  22  --  19  ?ALT  --  20  --  18  ?ALKPHOS  --  101  --  77  ?BILITOT  --  1.9*  --  1.6*  ?ALBUMIN  --  2.4*  --  2.2*  ?MG  --   --   --  1.8  ?LATICACIDVEN  --  2.6* 2.5*  --   ? ? ?------------------------------------------------------------------------------------------------------------------ ?No results for input(s): CHOL, HDL, LDLCALC,  TRIG, CHOLHDL, LDLDIRECT in the last 72  hours. ? ?Lab Results  ?Component Value Date  ? HGBA1C 7.4 (H) 03/14/2021  ?  ? ? ?Radiology Reports ?DG Chest Port 1 View ? ?Result Date: 03/29/2021 ?CLINICAL DATA:  Fever, w

## 2021-03-30 NOTE — Assessment & Plan Note (Signed)
?   Patient suffering from advanced myelodysplastic syndrome with extremely poor prognosis ?? Patient has recently been referred to hospice in the outpatient setting per review of oncology notes on 3/20 ?? Goals of care discussion with patient and family reveal that they still wish the patient to be hospitalized for management with antibiotics and blood products but did not wish for any escalation of care beyond that including performing any procedures. ?? Patient and family are agreeable to DNR ?? We will place palliative care consultation for the morning ?

## 2021-03-30 NOTE — Consult Note (Addendum)
? ?Timothy Murray  ?Telephone:([phone]) [phone] [phone]) [phone]  ? ? ?INITIAL HEMATOLOGY CONSULTATION ? ?Referring MD:  Dr. Lala Lund ? ?Reason for Referral: Pancytopenia, high risk MDS ? ?HPI: 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  He presented to the emergency department due to complaints of fever and weakness.  Family reported the patient has been having generalized weakness which has worsened and is severe in intensity.  It is associated with shortness of breath which is worse with exertion.  He had fevers up to 102 at home.  Admission lab work showed a WBC of 0.9, hemoglobin 4.9, platelets less than 5000.  He has received 2 units PRBC so far with 1 additional unit transfusing as also received 2 units of platelets. ? ?Records from the patient's primary oncologist have been reviewed.  He was last seen in their office on 03/23/2021.  The patient has been Vidaza since May 2022 and initially responded very well.  He was noted to have worsening thrombocytopenia and a repeat bone marrow biopsy in January 2023 showed persistent MDS but not any worsening in the blast count.  His primary oncologist consulted with Dr. Marvel Plan at [facility] and plan was to continue for days of but to add Promacta for his thrombocytopenia.  Despite titrating the doses of Promacta, his platelet count has not improved.  At his last visit, his overall performance status was found to be declining and hospice was recommended.  The patient was open to considering this. ? ?The patient was seen in the emergency department today.  His wife is at the bedside.  History obtained from the wife as the patient was sleeping.  He did awaken  briefly near the end of my conversation.  The patient's wife states that he has been having some bleeding from his mouth sores.  He has been having back pain.  The patient has been progressively weaker and sleeping a lot more at home.  He has been having some shortness of breath with exertion.  No chest pain reported.  He is not having any abdominal pain, nausea, vomiting.  She is requesting another opinion regarding his MDS.  She was hoping for transfer to [facility] to facilitate this.  Advised the patient's wife that [facility] did not accept a hospital hospital transfer.  She still remains invested in going to [facility] as an outpatient for another opinion.  Hematology was asked to see the patient for recommendations regarding his pancytopenia and MDS. ? ? ?Past Medical History:  ?Diagnosis Date  ? Arthritis   ? "lower back" (10/24/2012)  ? Coronary artery disease   ? GERD (gastroesophageal reflux disease)   ? High cholesterol   ? Hypertension   ? Liver cancer ([place])   ? MDS (myelodysplastic syndrome) ([place])   ? Type II diabetes mellitus ([place])   ?: ? ?Past Surgical History:  ?Procedure Laterality Date  ? APPENDECTOMY  2002  ? CARDIAC CATHETERIZATION  10/24/2012  ? CORONARY ARTERY BYPASS GRAFT N/A 10/26/2012  ? Procedure: CORONARY ARTERY BYPASS GRAFTING (CABG);  Surgeon: Melrose Nakayama, MD;  Location: [place];  Service: Open Heart Surgery;  Laterality: N/A;  Times 3 using left internal mammary artery and endoscopically harvested right saphenous vein  ? ERCP N/A 04/25/2020  ? Procedure:  ENDOSCOPIC RETROGRADE CHOLANGIOPANCREATOGRAPHY (ERCP);  Surgeon: Lucilla Lame, MD;  Location: [facility] ENDOSCOPY;  Service: Endoscopy;  Laterality: N/A;  ? IR BONE MARROW BIOPSY & ASPIRATION  01/26/2021  ?: ? ?CURRENT MEDS: ?Current Facility-Administered Medications  ?Medication Dose Route Frequency Provider Last Rate Last Admin  ? acetaminophen (TYLENOL) tablet 650 mg  650 mg Oral Q6H PRN Shalhoub, Sherryll Burger, MD      ? Or  ? acetaminophen (TYLENOL)  suppository 650 mg  650 mg Rectal Q6H PRN Shalhoub, Sherryll Burger, MD      ? acyclovir (ZOVIRAX) tablet 400 mg  400 mg Oral BID Shalhoub, Sherryll Burger, MD      ? atorvastatin (LIPITOR) tablet 40 mg  40 mg Oral Daily Shalhoub, Sherryll Burger, MD   40 mg at 03/30/21 1021  ? ceFEPIme (MAXIPIME) 2 g in sodium chloride 0.9 % 100 mL IVPB  2 g Intravenous Q8H Henri Medal, RPH   Stopped at 03/30/21 1404  ? furosemide (LASIX) injection 20 mg  20 mg Intravenous Once Thurnell Lose, MD      ? hydrALAZINE (APRESOLINE) injection 10 mg  10 mg Intravenous Q6H PRN Shalhoub, Sherryll Burger, MD      ? insulin aspart (novoLOG) injection 0-15 Units  0-15 Units Subcutaneous TID AC & HS Shalhoub, Sherryll Burger, MD   3 Units at 03/30/21 0809  ? loratadine (CLARITIN) tablet 5 mg  5 mg Oral Daily PRN Vernelle Emerald, MD      ? metoprolol tartrate (LOPRESSOR) tablet 25 mg  25 mg Oral BID Vernelle Emerald, MD   25 mg at 03/30/21 1021  ? nitroGLYCERIN (NITROSTAT) SL tablet 0.4 mg  0.4 mg Sublingual Q5 min PRN Shalhoub, Sherryll Burger, MD      ? ondansetron [facility]) tablet 4 mg  4 mg Oral Q6H PRN Shalhoub, Sherryll Burger, MD      ? Or  ? ondansetron (ZOFRAN) injection 4 mg  4 mg Intravenous Q6H PRN Shalhoub, Sherryll Burger, MD      ? oxyCODONE (Oxy IR/ROXICODONE) immediate release tablet 5 mg  5 mg Oral Q4H PRN Shalhoub, Sherryll Burger, MD   5 mg at 03/30/21 1250  ? pantoprazole (PROTONIX) injection 40 mg  40 mg Intravenous Q12H Shalhoub, Sherryll Burger, MD   40 mg at 03/30/21 1021  ? polyethylene glycol (MIRALAX / GLYCOLAX) packet 17 g  17 g Oral Daily PRN Shalhoub, Sherryll Burger, MD      ? traZODone (DESYREL) tablet 50 mg  50 mg Oral QHS PRN Shalhoub, Sherryll Burger, MD      ? ?Current Outpatient Medications  ?Medication Sig Dispense Refill  ? acetaminophen (TYLENOL) 325 MG tablet Take 2 tablets (650 mg total) by mouth every 6 (six) hours as needed for headache or mild pain.    ? acyclovir (ZOVIRAX) 400 MG tablet TAKE 1 TABLET BY MOUTH TWICE A DAY (Patient taking differently: Take 400 mg by mouth 2  (two) times daily.) 60 tablet 1  ? atorvastatin (LIPITOR) 40 MG tablet Take 40 mg by mouth daily.    ? furosemide (LASIX) 40 MG tablet Take 1 tablet (40 mg total) by mouth daily. 30 tablet 0  ? gabapentin (NEURONTIN) 100 MG capsule Take 300 mg by mouth 3 (three) times daily.    ? glimepiride (AMARYL) 1 MG tablet Take 1 mg by mouth 2 (two) times daily.    ? lactulose (CHRONULAC) 10 GM/15ML solution Take 15 - 30 ml daily as needed for constipation (Patient taking differently:  Take 10 g by mouth daily as needed for moderate constipation.) 300 mL 0  ? levofloxacin (LEVAQUIN) 500 MG tablet TAKE 1 TABLET (500 MG TOTAL) BY MOUTH DAILY. 30 tablet 0  ? loratadine (CLARITIN) 10 MG tablet Take 5 mg by mouth daily as needed for allergies. Take half a tablet (81m) daily    ? metoprolol tartrate (LOPRESSOR) 25 MG tablet Take 25 mg by mouth 2 (two) times daily.    ? Morphine Sulfate (MORPHINE CONCENTRATE) 10 mg / 0.5 ml concentrated solution Take 20 mg by mouth every 4 (four) hours as needed for pain.    ? nitroGLYCERIN (NITROSTAT) 0.3 MG SL tablet 0.3 mg every 5 (five) minutes as needed for chest pain.    ? omeprazole (PRILOSEC) 40 MG capsule TAKE 1 CAPSULE BY MOUTH TWICE A DAY (Patient taking differently: 40 mg daily as needed (acid reflux).) 180 capsule 1  ? oxyCODONE (OXY IR/ROXICODONE) 5 MG immediate release tablet Take 1 tablet (5 mg total) by mouth every 4 (four) hours as needed for severe pain. 60 tablet 0  ? traZODone (DESYREL) 50 MG tablet TAKE 1 TO 2 TABLETS BY MOUTH AT BEDTIME (Patient taking differently: Take 50 mg by mouth at bedtime as needed for sleep.) 180 tablet 1  ? Blood Glucose Monitoring Suppl (ONETOUCH VERIO FLEX SYSTEM) w/Device KIT 1 each by Does not apply route in the morning and at bedtime.    ? diclofenac Sodium (VOLTAREN) 1 % GEL Apply 1 application topically to affected area 4 (four) times daily. (Patient not taking: Reported on 03/30/2021) 100 g 0  ? eltrombopag (PROMACTA) 50 MG tablet Take 2 tablets  (100 mg total) by mouth daily. Take on an empty stomach 1 hour before a meal or 2 hours after (Patient not taking: Reported on 03/30/2021) 60 tablet 0  ? FLUZONE HIGH-DOSE QUADRIVALENT 0.7 ML SUSY Inject

## 2021-03-30 NOTE — Consult Note (Signed)
? ?                                                                                ?Consultation Note ?Date: 03/30/2021  ? ?Patient Name: Timothy Murray  ?DOB: 13-Oct-1945  MRN: [id number]  Age / Sex: 76 y.o., male  ?PCP: Perrin Maltese, MD ?Referring Physician: Vernelle Emerald, MD ? ?Reason for Consultation: Establishing goals of care "advanced myelodysplastic syndrome" ? ?HPI/Patient Profile: 76 y.o. male  with past medical history of myelodysplastic syndrome with chronic pancytopenia, hepatocellular carcinoma CAD (s/p CABG 2014), thoracic aortic ulceration (s/p endovascular repair 12/2017), cirrhosis of the liver, diabetes mellitus type 2, and hypertension. He presented to [facility] emergency department  on 03/29/2021 with complaints of fever and weakness. Admitted to [facility]. ? ?He was recently hospitalized 3/10-3/14 for acute high output congestive heart failure due to ongoing severe anemia. He was seen by his oncologist Dr. Janese Banks on 3/20; per her note there are no further meaningful treatment options to be offered and hospice was recommended.  ? ?Clinical Assessment and Goals of Care: ?I have reviewed medical records including EPIC notes, labs and imaging, and met at bedside with patient and his wife Timothy Murray to discuss diagnosis, prognosis, GOC, EOL wishes, disposition, and options. He is alert and oriented; reports back and knee pain which has been an ongoing issue. ? ?I introduced Palliative Medicine as specialized medical care for people living with serious illness. It focuses on providing relief from the symptoms and stress of a serious illness.  ? ?We discussed a brief life review of the patient. Timothy Murray was born in the [place] and immigrated to the [place] in 2003. He is retired from working for [employer] in the [place]. He and Timothy Murray have been married for 20 years; it was a second marriage for both of them.  ? ?He and Timothy Murray live in a private residence in [place]. As far as functional status, he has  become progressively weaker. He is not currently able to ambulate. He has been sleeping a lot more. Timothy Murray reports that he has been eating well; however his albumin in 2.2.  ? ?We discussed patient's current illness and what it means in the larger context of his ongoing co-morbidities. I provided education on the natural disease trajectory of advanced cancer, emphasizing that functional status is generally preserved until late in the disease course. Created space and opportunity for patient and wife to express thoughts and feelings regarding patient's current medical situation. Emotional support provided ?  ?Timothy Murray expresses feeling like his primary oncologist has "given up on him". I referenced Dr. Elroy Channel note from 3/20 to explain that his significant and worsening pancytopenia is highly concerning for disease progression and failure to respond to treatment (Vidaza and Promacta). Dr. Janese Banks even reached out to Dr. Marvel Plan at [facility] to inquire if he would be eligible for clinical trials. In the setting of his declining functional status, there is concern he would not be strong enough to tolerate additional treatment.  ? ?The difference between aggressive medical intervention and comfort care was considered. Patient and wife state they wish to pursue transfer to [facility] for a second opinion. They are hopeful he  could continue treatment. They tell me that when they previously agreed to hospice, they did not fully understand what this meant.  ? ?I later discussed case with Dr. Candiss Norse and learned that he reached out to [facility] this morning and they did not accept the patient as he does not meet criteria for higher level of care and they did not have additional treatment to offer. Dr. Candiss Norse has informed patient and wife of [facility]'s response. Discussed the possibility that they did not understand this. Patient will be seen by Dr. Benay Spice this evening.  ? ? ?Primary decision maker: Patient ?  ? ?SUMMARY OF RECOMMENDATIONS   ?DNR/DNI  as previously documented ?Continue current interventions ?Patient and wife are still requesting transfer to [place] - unclear if they understand [facility] is not accepting him ?Pending oncology consult  ?PMT will follow up tomorrow ? ?Symptom Management:  ?Agree with oxycodone IR every 4 hours as needed for pain ? ?Prognosis:  ?< 6 months ? ?Discharge Planning: To Be Determined  ? ?  ? ?Primary Diagnoses: ?Present on Admission: ? Acute on chronic blood loss anemia ? Neutropenic fever ([place]) ? Type 2 diabetes mellitus with hyperglycemia, without long-term current use of insulin ([place]) ? Coronary artery disease involving native coronary artery of native heart without angina pectoris ? Essential hypertension ? GERD without esophagitis ? Mixed diabetic hyperlipidemia associated with type 2 diabetes mellitus ([place]) ? Myelodysplastic syndrome ([place]) ? Lactic acidosis ? ? ?I have reviewed the medical record, interviewed the patient and family, and examined the patient. The following aspects are pertinent. ? ?Past Medical History:  ?Diagnosis Date  ? Arthritis   ? "lower back" (10/24/2012)  ? Coronary artery disease   ? GERD (gastroesophageal reflux disease)   ? High cholesterol   ? Hypertension   ? Liver cancer ([place])   ? MDS (myelodysplastic syndrome) ([place])   ? Type II diabetes mellitus ([place])   ? ? ? ?Family History  ?Problem Relation Age of Onset  ? Cancer Sister   ? ?Scheduled Meds: ? acyclovir  400 mg Oral BID  ? atorvastatin  40 mg Oral Daily  ? feeding supplement  237 mL Oral BID BM  ? insulin aspart  0-15 Units Subcutaneous TID AC & HS  ? metoprolol tartrate  25 mg Oral BID  ? pantoprazole (PROTONIX) IV  40 mg Intravenous Q12H  ? ?Continuous Infusions: ? ceFEPime (MAXIPIME) IV Stopped (03/30/21 1404)  ? ?PRN Meds:.acetaminophen **OR** acetaminophen, hydrALAZINE, loratadine, nitroGLYCERIN, ondansetron **OR** ondansetron (ZOFRAN) IV, oxyCODONE, polyethylene glycol, traZODone ? ? ?Allergies  ?Allergen Reactions  ? Dome-Paste  Bandage [Wound Dressings]   ?  All bandaids cause a rash  ? Soap & Cleansers   ?  "liquid soap" antibacterial -rash  ? Latex Rash and Itching  ? ?Review of Systems  ?Musculoskeletal:  Positive for back pain.  ?     Knee pain  ? ?Physical Exam ?Vitals reviewed.  ?Constitutional:   ?   General: He is not in acute distress. ?   Appearance: He is ill-appearing.  ?Cardiovascular:  ?   Rate and Rhythm: Normal rate.  ?Pulmonary:  ?   Effort: Pulmonary effort is normal.  ?Neurological:  ?   Mental Status: He is alert and oriented to person, place, and time.  ?   Motor: Weakness present.  ? ? ?Vital Signs: BP (!) 117/47 (BP Location: Left Arm)   Pulse 73   Temp 98.5 ?F (36.9 ?C) (Oral)   Resp (!) 23  Ht 6' (1.829 m)   Wt 82.6 kg   SpO2 100%   BMI 24.68 kg/m?  ?Pain Scale: 0-10 ?  ?Pain Score: 4  ? ? ?SpO2: SpO2: 100 % ?O2 Device:SpO2: 100 % ?O2 Flow Rate: .O2 Flow Rate (L/min): 2 L/min ? ? ? ?LBM:   ?Baseline Weight: Weight: 82.6 kg ?Most recent weight: Weight: 82.6 kg     ? ?Palliative Assessment/Data: PPS 30% ? ? ? ?MDM - High ? ?Signed by: ?Lavena Bullion, NP ?  ?Please contact Palliative Medicine Team phone at [phone] for questions and concerns.  ?For individual provider: See Amion ? ? ? ? ? ? ? ? ? ? ? ? ? ?

## 2021-03-30 NOTE — Assessment & Plan Note (Signed)
?   Resume patients home regimen of metoprolol ?? PRN intravenous antihypertensives for excessively elevated blood pressure ? ? ?

## 2021-03-30 NOTE — ED Notes (Signed)
Pt had a bowel movement. RN cleaned pt, applied new diaper, bed sheet, and chucks.  ?

## 2021-03-30 NOTE — Assessment & Plan Note (Signed)
?   Lactic acidosis likely secondary to tissue hypoperfusion from profound anemia ?? Transfusing with packed red blood cells ?? Performing serial lactic acid levels to ensure downtrending and resolution ?

## 2021-03-30 NOTE — Progress Notes (Signed)
Pharmacy Antibiotic Note ? ?Timothy Murray is a 76 y.o. male for which pharmacy has been consulted for cefepime dosing. Patient with a history of cirrhosis and hepatocellular carcinoma . Patient presenting with fever and weakness. Today, Scr is 1.11 with CrCl ~63 ml/min.  ? ?Plan: ?Adjust cefepime to 2g q8h based on renal function ?Trend WBC, Fever, Renal function, & Clinical course ?F/u cultures, clinical course, WBC, fever ?De-escalate when able ? ?Height: 6' (182.9 cm) ?Weight: 82.6 kg (182 lb) ?IBW/kg (Calculated) : 77.6 ? ?Temp (24hrs), Avg:98.4 ?F (36.9 ?C), Min:97.8 ?F (36.6 ?C), Max:100.2 ?F (37.9 ?C) ? ?Recent Labs  ?Lab 03/23/21 ?0824 03/29/21 ?1935 03/29/21 ?2135 03/29/21 ?2216 03/30/21 ?0416  ?WBC 0.8* 0.9* 0.7*  --  0.7*  ?CREATININE 1.13 1.24  --   --  1.11  ?LATICACIDVEN  --   --  2.6* 2.5*  --   ? ?  ?Estimated Creatinine Clearance: 63.1 mL/min (by C-G formula based on SCr of 1.11 mg/dL).   ? ?Allergies  ?Allergen Reactions  ? Dome-Paste Bandage [Wound Dressings]   ?  All bandaids cause a rash  ? Soap & Cleansers   ?  "liquid soap" antibacterial -rash  ? Latex Rash and Itching  ? ? ?Antimicrobials this admission: ?Cefepime 3/26 >>  ? ?Microbiology results: ?3/26 bcx: ? ?Thank you for allowing pharmacy to be a part of this patient?s care. ? ?Cristela Felt, PharmD, BCPS ?Clinical Pharmacist ?03/30/2021 7:19 AM ? ?

## 2021-03-30 NOTE — Progress Notes (Signed)
Oral care order not placed due to pt's allergy to mouth care products.  ?

## 2021-03-30 NOTE — ED Notes (Signed)
Blood cooler used ?

## 2021-03-30 NOTE — Assessment & Plan Note (Addendum)
?   Advanced severe disease with poor prognosis ?? Patient follows with Dr. Janese Banks with hematology/oncology in [place] in the outpatient setting ?? Patient has additionally obtained a second opinion from Dr. Marvel Plan at [facility] hematology/oncology ?? Per last outpatient oncology note on 3/20 hospice referral was discussed with patient and family who are agreeable to proceed. ?? Oncology to evaluate patient tomorrow morning in consultation ?? Holding patient's home regimen of Promacta for now until oncology provides input ?

## 2021-03-30 NOTE — Progress Notes (Signed)
Pt. Arrived to unit from ED, alert and oriented VSS. C/O pain to lower back med's and heating pack provide. Wife at bedside bed in lowest position call bell with in reach ?

## 2021-03-31 ENCOUNTER — Other Ambulatory Visit: Payer: Self-pay

## 2021-03-31 ENCOUNTER — Other Ambulatory Visit: Payer: Self-pay | Admitting: Internal Medicine

## 2021-03-31 ENCOUNTER — Other Ambulatory Visit (HOSPITAL_COMMUNITY): Payer: Self-pay

## 2021-03-31 DIAGNOSIS — R5081 Fever presenting with conditions classified elsewhere: Secondary | ICD-10-CM

## 2021-03-31 DIAGNOSIS — Z7189 Other specified counseling: Secondary | ICD-10-CM | POA: Diagnosis not present

## 2021-03-31 DIAGNOSIS — Z66 Do not resuscitate: Secondary | ICD-10-CM | POA: Diagnosis not present

## 2021-03-31 DIAGNOSIS — R5383 Other fatigue: Secondary | ICD-10-CM | POA: Diagnosis not present

## 2021-03-31 DIAGNOSIS — D709 Neutropenia, unspecified: Secondary | ICD-10-CM

## 2021-03-31 DIAGNOSIS — D469 Myelodysplastic syndrome, unspecified: Secondary | ICD-10-CM | POA: Diagnosis not present

## 2021-03-31 DIAGNOSIS — Z515 Encounter for palliative care: Secondary | ICD-10-CM | POA: Diagnosis not present

## 2021-03-31 DIAGNOSIS — I959 Hypotension, unspecified: Secondary | ICD-10-CM | POA: Diagnosis not present

## 2021-03-31 DIAGNOSIS — D62 Acute posthemorrhagic anemia: Secondary | ICD-10-CM | POA: Diagnosis not present

## 2021-03-31 DIAGNOSIS — Z743 Need for continuous supervision: Secondary | ICD-10-CM | POA: Diagnosis not present

## 2021-03-31 DIAGNOSIS — D61818 Other pancytopenia: Secondary | ICD-10-CM | POA: Diagnosis not present

## 2021-03-31 DIAGNOSIS — Z7401 Bed confinement status: Secondary | ICD-10-CM | POA: Diagnosis not present

## 2021-03-31 LAB — BPAM PLATELET PHERESIS
Blood Product Expiration Date: 202303282359
Blood Product Expiration Date: 202303282359
ISSUE DATE / TIME: 202303270234
ISSUE DATE / TIME: 202303270234
Unit Type and Rh: 6200
Unit Type and Rh: 6200

## 2021-03-31 LAB — PREPARE PLATELET PHERESIS
Unit division: 0
Unit division: 0

## 2021-03-31 LAB — CBC WITH DIFFERENTIAL/PLATELET
Abs Immature Granulocytes: 0.03 10*3/uL (ref 0.00–0.07)
Basophils Absolute: 0 10*3/uL (ref 0.0–0.1)
Basophils Relative: 0 %
Eosinophils Absolute: 0 10*3/uL (ref 0.0–0.5)
Eosinophils Relative: 0 %
HCT: 21.2 % — ABNORMAL LOW (ref 39.0–52.0)
Hemoglobin: 7.7 g/dL — ABNORMAL LOW (ref 13.0–17.0)
Immature Granulocytes: 4 %
Lymphocytes Relative: 48 %
Lymphs Abs: 0.3 10*3/uL — ABNORMAL LOW (ref 0.7–4.0)
MCH: 32.5 pg (ref 26.0–34.0)
MCHC: 36.3 g/dL — ABNORMAL HIGH (ref 30.0–36.0)
MCV: 89.5 fL (ref 80.0–100.0)
Monocytes Absolute: 0.1 10*3/uL (ref 0.1–1.0)
Monocytes Relative: 11 %
Neutro Abs: 0.3 10*3/uL — CL (ref 1.7–7.7)
Neutrophils Relative %: 37 %
Platelets: 33 10*3/uL — ABNORMAL LOW (ref 150–400)
RBC: 2.37 MIL/uL — ABNORMAL LOW (ref 4.22–5.81)
RDW: 15 % (ref 11.5–15.5)
WBC: 0.7 10*3/uL — CL (ref 4.0–10.5)
nRBC: 0 % (ref 0.0–0.2)

## 2021-03-31 LAB — COMPREHENSIVE METABOLIC PANEL
ALT: 17 U/L (ref 0–44)
AST: 19 U/L (ref 15–41)
Albumin: 2.1 g/dL — ABNORMAL LOW (ref 3.5–5.0)
Alkaline Phosphatase: 79 U/L (ref 38–126)
Anion gap: 7 (ref 5–15)
BUN: 18 mg/dL (ref 8–23)
CO2: 28 mmol/L (ref 22–32)
Calcium: 8.3 mg/dL — ABNORMAL LOW (ref 8.9–10.3)
Chloride: 103 mmol/L (ref 98–111)
Creatinine, Ser: 1.06 mg/dL (ref 0.61–1.24)
GFR, Estimated: >60 mL/min (ref >60)
Glucose, Bld: 114 mg/dL — ABNORMAL HIGH (ref 70–99)
Potassium: 3.3 mmol/L — ABNORMAL LOW (ref 3.5–5.1)
Sodium: 138 mmol/L (ref 135–145)
Total Bilirubin: 1.7 mg/dL — ABNORMAL HIGH (ref 0.3–1.2)
Total Protein: 4.9 g/dL — ABNORMAL LOW (ref 6.5–8.1)

## 2021-03-31 LAB — GLUCOSE, CAPILLARY
Glucose-Capillary: 142 mg/dL — ABNORMAL HIGH (ref 70–99)
Glucose-Capillary: 194 mg/dL — ABNORMAL HIGH (ref 70–99)

## 2021-03-31 LAB — SAVE SMEAR(SSMR), FOR PROVIDER SLIDE REVIEW

## 2021-03-31 LAB — MAGNESIUM: Magnesium: 1.9 mg/dL (ref 1.7–2.4)

## 2021-03-31 LAB — MRSA NEXT GEN BY PCR, NASAL: MRSA by PCR Next Gen: NOT DETECTED

## 2021-03-31 MED ORDER — OXYCODONE HCL 5 MG PO TABS
5.0000 mg | ORAL_TABLET | ORAL | Status: AC
  Administered 2021-03-31: 5 mg via ORAL

## 2021-03-31 MED ORDER — FLUCONAZOLE 100 MG PO TABS
100.0000 mg | ORAL_TABLET | Freq: Every day | ORAL | 0 refills | Status: DC
  Filled 2021-03-31 (×2): qty 10, 10d supply, fill #0

## 2021-03-31 MED ORDER — MORPHINE SULFATE (PF) 2 MG/ML IV SOLN
2.0000 mg | INTRAVENOUS | Status: DC | PRN
  Filled 2021-03-31: qty 1

## 2021-03-31 MED ORDER — FLUCONAZOLE 100 MG PO TABS
100.0000 mg | ORAL_TABLET | Freq: Every day | ORAL | Status: DC
  Administered 2021-03-31: 100 mg via ORAL
  Filled 2021-03-31: qty 1

## 2021-03-31 MED ORDER — FLUCONAZOLE 100 MG PO TABS: 100.0000 mg | ORAL_TABLET | Freq: Every day | ORAL | 0 refills | Status: DC

## 2021-03-31 MED ORDER — POTASSIUM CHLORIDE CRYS ER 20 MEQ PO TBCR
40.0000 meq | EXTENDED_RELEASE_TABLET | Freq: Once | ORAL | Status: AC
  Administered 2021-03-31: 40 meq via ORAL
  Filled 2021-03-31: qty 2

## 2021-03-31 NOTE — Progress Notes (Signed)
Nutrition Brief Note ? ?Patient identified on the Malnutrition Screening Tool (MST) Report. ? ?Admitting Dx: Neutropenic fever ([place]) [D70.9, R50.81] ?Pancytopenia ([place]) [D61.818] ?Acute on chronic blood loss anemia [D62] ?PMH:  ?Past Medical History:  ?Diagnosis Date  ? Arthritis   ? "lower back" (10/24/2012)  ? Coronary artery disease   ? GERD (gastroesophageal reflux disease)   ? High cholesterol   ? Hypertension   ? Liver cancer ([place])   ? MDS (myelodysplastic syndrome) ([place])   ? Type II diabetes mellitus ([place])   ? ? ?Medications:  ?Scheduled Meds: ? acyclovir  400 mg Oral BID  ? atorvastatin  40 mg Oral Daily  ? feeding supplement  237 mL Oral BID BM  ? fluconazole  100 mg Oral Daily  ? insulin aspart  0-15 Units Subcutaneous TID AC & HS  ? metoprolol tartrate  25 mg Oral BID  ? pantoprazole (PROTONIX) IV  40 mg Intravenous Q12H  ? ?Continuous Infusions: ? ceFEPime (MAXIPIME) IV 2 g (03/31/21 1638)  ? ? ?Labs: ?Recent Labs  ?Lab 03/29/21 ?1935 03/30/21 ?4536 03/31/21 ?0313  ?NA 132* 134* 138  ?K 4.0 3.8 3.3*  ?CL 96* 99 103  ?CO2 '25 25 28  '$ ?BUN '22 22 18  '$ ?CREATININE 1.24 1.11 1.06  ?CALCIUM 8.5* 8.3* 8.3*  ?MG  --  1.8 1.9  ?GLUCOSE 233* 268* 114*  ? ? ?Wt Readings from Last 15 Encounters:  ?03/29/21 82.6 kg  ?03/23/21 86.5 kg  ?03/17/21 84.6 kg  ?03/09/21 87.1 kg  ?02/27/21 88 kg  ?02/23/21 91.9 kg  ?02/10/21 91.9 kg  ?02/09/21 91.9 kg  ?02/02/21 92 kg  ?01/19/21 90.9 kg  ?12/22/20 88 kg  ?11/24/20 87.1 kg  ?11/17/20 87.1 kg  ?10/20/20 88.5 kg  ?09/22/20 88.2 kg  ? ? ?Body mass index is 24.68 kg/m?Marland Kitchen Patient meets criteria for normal based on current BMI.  ? ?Current diet order is heart healthy/carb modified, no meal completions documented.  ? ?Note pt has discharge orders signed and is discharging home with hospice.  ? ?No nutrition interventions warranted at this time. If nutrition issues arise, please consult RD.  ? ? ?Theone Stanley., MS, RD, LDN (she/her/hers) ?RD pager number and weekend/on-call pager number  located in [place]. ? ? ? ? ?

## 2021-03-31 NOTE — TOC Progression Note (Signed)
Transition of Care (TOC) - Progression Note  ? ? ?Patient Details  ?Name: [place] ?MRN: [id number] ?Date of Birth: 1945/01/25 ? ?Transition of Care (TOC) CM/SW Contact  ?Sharin Mons, RN ?Phone Number: ?03/31/2021, 8:30 AM ? ?Clinical Narrative:    ?NCM received consult for home hospice care. NCM spoke with pt @ bedside and pt confirmed d/c plan for home with hospice care.Pt choose [facility] for provider. NCM called and made hospice referral with liaison Tim @ [facility]. Tim informed NCM pt already active with [facility] and will follow once d/c. ? ?TOC team will continue to monitor and assist with d/c needs.... ? ? ?Expected Discharge Plan: [place] ?  ? ?Expected Discharge Plan and Services ?Expected Discharge Plan: [place]  ?Expected Discharge Date: 03/31/21               ?  ?  ?  ?  ?  ?  ?  ?  ?  ?  ? ? ?Social Determinants of Health (SDOH) Interventions ?  ? ?Readmission Risk Interventions ?   ? View : No data to display.  ?  ?  ?  ? ? ?

## 2021-03-31 NOTE — Discharge Instructions (Signed)
Disposition.  Residential hospice °Condition.  Guarded °CODE STATUS.  DNR °Activity.  With assistance as tolerated, full fall precautions. °Diet.  Soft with feeding assistance and aspiration precautions. °Goal of care.  Comfort. ° °

## 2021-03-31 NOTE — Consult Note (Signed)
? ?  [facility] CM Inpatient Consult ? ? ?03/31/2021 ? ?Devin Mccaffery ?02/25/1945 ?[id number] ? ?[facility] [[facility]]  Accountable Care Organization [ACO] Patient: [employer] Medicare ? ?Primary Care Provider:  Khan, Neelam S, MD, [facility] ? ?Patient is showing as pending status with [facility] RN Care Manager, Geriatric -NP. Met with patient at bedside and patient confirms he is transitioning to hospice care at home. Patient spoke about transitioning from this life and also he believes in miracles. Encouraged ventilation.  ? ?Chart reviewed and reveals the patient is currently transitioning to Hospice/Palliative Care.  ? ?Given this choice patient will have full case management services through Hospice and needs will be met at the hospice level of care. ?Plan:  Updated [facility] G-NP of post hospital plans for hospice care. ? ? , RN BSN CCM ?[facility] Hospital Liaison ? [phone] business mobile phone ?Toll free office [phone]  ?Fax number: [phone] [email or web address] ?  ? ? ?Please contact for further questions, ? ? , RN BSN CCM ?[facility] Hospital Liaison ? [phone] business mobile phone ?Toll free office [phone]  ?Fax number: [phone] [email or web address] ? ? ? ?

## 2021-03-31 NOTE — Progress Notes (Addendum)
HEMATOLOGY-ONCOLOGY PROGRESS NOTE ? ?ASSESSMENT AND PLAN: ?1.  MDS with 5 q. Deletion, monosomy 7, 7q deletion, no excess blasts, SF3B1 mutation negative. ?-Initiated treatment with Vidaza in May 2022.  Last cycle given 02/09/2021 through 02/13/2021 ?-Referred to Hospice on 03/23/2021 ?2.  [place] status post thermal ablation ?3.  Pancytopenia secondary #1-transfusion dependent ?-Has received Promacta in the past for thrombocytopenia ?4.  Fever ?5.  Cirrhosis ?6.  Hypertension ?7.  GERD ?8.  CAD status post CABG ?9.  Diabetes mellitus ?10.  Oral candidiasis ?  ?Timothy Murray appears unchanged.  Hemoglobin and platelets have improved following transfusions.  We again had a discussion with the patient and his family this morning regarding outpatient referral to [facility] and possible trial of erythropoietin +/- G-CSF.  The patient has indicated that he would like to reenroll with hospice.  His wife inquired about continued blood transfusions and we told her that transfusions would not continue while on hospice.  The patient has also indicated that he would not want any additional transfusions. ? ?He has oral candidiasis and will start him on fluconazole 100 mg daily for 4 days. ? ?Recommend TOC referral for enrolling him in hospice.  Okay to discharge from our standpoint once hospice services have been arranged. ?  ?Recommendations: ?1.  TOC referral to help him enroll back in hospice. ?2.  Fluconazole 100 mg daily x4 days. ?3.  Agree with continuing Levaquin prophylaxis ?  ?Thank you for this referral. ?  ?Mikey Bussing, DNP, AGPCNP-BC, AOCNP  ? [name] was interviewed and examined.  His wife is at the bedside when I saw him early this morning.  He has mild oral candidiasis.  He requested treatment.  We prescribed Diflucan. ? ?He indicated he has decided to reenroll in home hospice care.  He does not wish to pursue another hematology opinion at [facility].  He understands he will not be able to receive further blood transfusions while  enrolled in hospice.  I informed Dr. Janese Banks of his decision. ? ?He will be discharged home with home hospice care.  I am available to see him as needed. ? ?I was present for greater than 50% of today's visit.  I performed medical stage making. ?  ?SUBJECTIVE: No new problems. No bleeding this morning.  ? ?Oncology History  ?Myelodysplastic syndrome ([place])  ?05/26/2020 Initial Diagnosis  ? MDS (myelodysplastic syndrome) ([place]) ?  ?05/27/2020 -  Chemotherapy  ? Patient is on Treatment Plan : MYELODYSPLASIA  Azacitidine SQ D1-7 q28d  ?   ? ?PHYSICAL EXAMINATION: ? ?Vitals:  ? 03/31/21 0820 03/31/21 1257  ?BP: (!) 121/47 (!) 132/53  ?Pulse: 81 88  ?Resp:  16  ?Temp:    ?SpO2:    ? ?Filed Weights  ? 03/29/21 1933  ?Weight: 82.6 kg  ? ? ?Intake/Output from previous day: ?03/27 0701 - 03/28 0700 ?In: 100 [IV Piggyback:100] ?Out: 100 [Urine:100] ? ?Physical Exam ? ?LABORATORY DATA:  ?I have reviewed the data as listed ? ?  Latest Ref Rng & Units 03/31/2021  ?  3:13 AM 03/30/2021  ?  4:16 AM 03/29/2021  ?  9:35 PM  ?CMP  ?Glucose 70 - 99 mg/dL 114   268     ?BUN 8 - 23 mg/dL 18   22     ?Creatinine 0.61 - 1.24 mg/dL 1.06   1.11     ?Sodium 135 - 145 mmol/L 138   134     ?Potassium 3.5 - 5.1 mmol/L 3.3  3.8     ?Chloride 98 - 111 mmol/L 103   99     ?CO2 22 - 32 mmol/L 28   25     ?Calcium 8.9 - 10.3 mg/dL 8.3   8.3     ?Total Protein 6.5 - 8.1 g/dL 4.9   4.8   5.4    ?Total Bilirubin 0.3 - 1.2 mg/dL 1.7   1.6   1.9    ?Alkaline Phos 38 - 126 U/L 79   77   101    ?AST 15 - 41 U/L '19   19   22    '$ ?ALT 0 - 44 U/L '17   18   20    '$ ? ? ?Lab Results  ?Component Value Date  ? WBC 0.7 (LL) 03/31/2021  ? HGB 7.7 (L) 03/31/2021  ? HCT 21.2 (L) 03/31/2021  ? MCV 89.5 03/31/2021  ? PLT 33 (L) 03/31/2021  ? NEUTROABS 0.3 (LL) 03/31/2021  ? ? ?No results found for: CEA1, CEA, [id number], CA125, PSA1 ? ?DG Chest 2 View ? ?Result Date: 03/13/2021 ?CLINICAL DATA:  Chest pain. EXAM: CHEST - 2 VIEW COMPARISON:  February 27, 2021. FINDINGS: The heart size and  mediastinal contours are within normal limits. Status post coronary bypass graft. Stable stent graft seen in descending thoracic aorta. Both lungs are clear. The visualized skeletal structures are unremarkable. IMPRESSION: No active cardiopulmonary disease. Electronically Signed   By: Marijo Conception M.D.   On: 03/13/2021 16:40  ? ?MR Lumbar Spine W Wo Contrast ? ?Result Date: 03/11/2021 ?CLINICAL DATA:  Back pain, unspecified laterality or chronicity EXAM: MRI LUMBAR SPINE WITHOUT AND WITH CONTRAST TECHNIQUE: Multiplanar and multiecho pulse sequences of the lumbar spine were obtained without and with intravenous contrast. CONTRAST:  7.60m GADAVIST GADOBUTROL 1 MMOL/ML IV SOLN COMPARISON:  None. FINDINGS: Segmentation:  Standard. Alignment:  No significant listhesis. Vertebrae: Vertebral body heights are maintained. There is decreased T1 marrow signal. No focal suspicious osseous lesion. No marrow edema. Conus medullaris and cauda equina: Conus extends to the L1 level. Conus and cauda equina appear normal. No abnormal intrathecal enhancement. Paraspinal and other soft tissues: Unremarkable. Disc levels: T12-L1: Minimal disc bulge with superimposed central/left central protrusion. No canal or foraminal stenosis. L1-L2: No canal or foraminal stenosis. L2-L3: Disc bulge with superimposed central protrusion. No canal stenosis. Slight effacement subarticular recesses. No foraminal stenosis. L3-L4: Disc bulge eccentric to the right with endplate osteophytic ridging. No canal stenosis. Slight effacement of the subarticular recesses. Mild right foraminal stenosis. No left foraminal stenosis. L4-L5: Disc bulge with endplate osteophytic ridging. Mild facet arthropathy. No canal stenosis. Narrowing of the right greater than left subarticular recesses. Mild foraminal stenosis. L5-S1: Disc bulge. No canal left foraminal stenosis. Minor right foraminal stenosis. IMPRESSION: Multilevel degenerative changes as detailed above without  high-grade stenosis. Decreased T1 marrow signal probably reflects suspected myelodysplastic syndrome. Electronically Signed   By: PMacy MisM.D.   On: 03/11/2021 08:40  ? ?DG Chest Port 1 View ? ?Result Date: 03/29/2021 ?CLINICAL DATA:  Fever, weakness EXAM: PORTABLE CHEST 1 VIEW COMPARISON:  03/13/2021 FINDINGS: Single frontal view of the chest demonstrates stable postsurgical changes from CABG. Endoluminal stent graft of the distal aortic arch and descending thoracic aorta unchanged. Cardiac silhouette is stable. No acute airspace disease, effusion, or pneumothorax. Lung volumes are diminished with crowding of the central vasculature. No acute bony abnormalities. IMPRESSION: 1. Mild vascular congestion.  No acute airspace disease. Electronically Signed   By: MLegrand Como  Owens Shark M.D.   On: 03/29/2021 21:31  ? ?ECHOCARDIOGRAM COMPLETE ? ?Result Date: 03/14/2021 ?   ECHOCARDIOGRAM REPORT   Patient Name:   Timothy Murray Date of Exam: 03/14/2021 Medical Rec #:  [id number]       Height:       72.0 in Accession #:    [id number]      Weight:       193.6 lb Date of Birth:  1945-08-01       BSA:          2.101 m? Patient Age:    38 years        BP:           131/52 mmHg Patient Gender: M               HR:           104 bpm. Exam Location:  Inpatient Procedure: 2D Echo Indications:    Acute diastolic CHF  History:        Patient has prior history of Echocardiogram examinations, most                 recent 10/26/2012. CAD; Risk Factors:Diabetes and Hypertension.  Sonographer:    Arlyss Gandy Referring Phys: [id number] [place]  1. Left ventricular ejection fraction, by estimation, is >75%. The left ventricle has hyperdynamic function. The left ventricle has no regional wall motion abnormalities. Left ventricular diastolic parameters were normal.  2. Right ventricular systolic function is normal. The right ventricular size is normal. There is normal pulmonary artery systolic pressure.  3. The mitral valve is normal  in structure. No evidence of mitral valve regurgitation.  4. The aortic valve is tricuspid. Aortic valve regurgitation is trivial. Aortic valve sclerosis is present, with no evidence of aortic valve

## 2021-03-31 NOTE — TOC Transition Note (Addendum)
Transition of Care (TOC) - CM/SW Discharge Note ? ? ?Patient Details  ?Name: Timothy Murray ?MRN: [id number] ?Date of Birth: [birth year] ? ?Transition of Care [facility]) CM/SW Contact:  ?Sharin Mons, RN ?Phone Number: [phone] ?03/31/2021, 11:02 AM ? ? ?Clinical Narrative:    ?Patient will DC to: home ?Anticipated DC date: 03/31/2021 ?Family notified: wife ?Transport by: car ? ?Admitted with acute on chronic blood loss anemia. Hx of  myelodysplastic syndrome with chronic pancytopenia, hepatocellular carcinoma, CAD s/p CABG in 2014, thoracic aortic , liver cirrhosis, DM type II, hypertension, GERD, dyslipidemia. ? ?Per MD patient ready for DC today. RN, patient, patient's family, and Tim with [facility] notified of DC.  [facility] will provide pt with DME needs. ? ?Post hospital f/u noted on AVS. ? ?Pt without Rx med concerns. ? ?PTAR/Ambulance transport requested for patient.  ? ?RNCM will sign off for now as intervention is no longer needed. Please consult [place] again if new needs arise.  ? ? ?  ? ?RNCM will sign off for now as intervention is no longer needed. Please consult [place] again if new needs arise.  ? ? ? ?Final next level of care: [place] ?Barriers to Discharge: No Barriers Identified ? ? ?Patient Goals and CMS Choice ?  ?  ?  ? ?Discharge Placement ?  ?           ?  ?  ?  ?  ? ?Discharge Plan and Services ?  ?  ?           ?  ?  ?  ?  ?  ?  ?  ?  ?  ?  ? ?Social Determinants of Health (SDOH) Interventions ?  ? ? ?Readmission Risk Interventions ?   ? View : No data to display.  ?  ?  ?  ? ? ? ? ? ?

## 2021-03-31 NOTE — Progress Notes (Signed)
? ?                                                                                                                                                     ?                                                   ?Daily Progress Note  ? ?Patient Name: Timothy Murray       Date: 03/31/2021 ?DOB: [birth year]  Age: 76 y.o. MRN#: [id number] ?Attending Physician: Thurnell Lose, MD ?Primary Care Physician: Perrin Maltese, MD ?Admit Date: 03/29/2021 ? ? ? ?HPI/Patient Profile: 76 y.o. male  with past medical history of myelodysplastic syndrome with chronic pancytopenia, hepatocellular carcinoma CAD (s/p CABG 2014), thoracic aortic ulceration (s/p endovascular repair 12/2017), cirrhosis of the liver, diabetes mellitus type 2, and hypertension. He presented to [facility] emergency department  on 03/29/2021 with complaints of fever and weakness. Admitted to [facility]. ?  ?He was recently hospitalized 3/10-3/14 for acute high output congestive heart failure due to ongoing severe anemia. He was seen by his oncologist Dr. Janese Banks on 3/20; per her note there are no further meaningful treatment options to be offered and hospice was recommended.  ?  ? ?Subjective: ?Chart reviewed.  Note that patient was seen by oncology yesterday evening.  Per their note, patient has decided to proceed with hospice care, does not wish to pursue a second opinion at [facility], understands that he will not be able to receive further blood transfusions while enrolled in hospice. ? ?I went to see patient at bedside.  He and his wife are anxious to go home.  He reports continued pain despite recent dose of as needed oxycodone.  Discussed increasing dose of oxycodone at home if needed.  While patient is working with the RN, I confirmed with wife that plan is to go home with hospice.  She tells me that things "are in God's hands now ".  She verbalizes understanding that he has end-stage illness stating they are "at the end of the road "and there is not much else that can be done.  She  is accepting of the plan for him to go home, be comfortable, and spend time with family.  Therapeutic listening utilized and emotional support provided. ? ?Objective: ? ?Physical Exam ?Vitals reviewed.  ?Constitutional:   ?   General: He is not in acute distress. ?   Appearance: He is ill-appearing.  ?Pulmonary:  ?   Effort: Pulmonary effort is normal.  ?Neurological:  ?   Mental Status: He is alert and oriented to person, place, and time.  ?         ? ?Vital  Signs: BP (!) 121/47 (BP Location: Left Arm)   Pulse 81   Temp 97.6 ?F (36.4 ?C)   Resp 19   Ht 6' (1.829 m)   Wt 82.6 kg   SpO2 100%   BMI 24.68 kg/m?  ?SpO2: SpO2: 100 % ?O2 Device: O2 Device: Room Air ?O2 Flow Rate: O2 Flow Rate (L/min): 3 L/min ? ? ?LBM: Last BM Date : 03/30/21 ?Baseline Weight: Weight: 82.6 kg ?Most recent weight: Weight: 82.6 kg ? ?     ?Palliative Assessment/Data: PPS 40% ? ? ? ? ? ?Palliative Care Assessment & Plan  ? ?Assessment: ?- myelodysplastic syndrome ?- severe pancytopenia ?- neutropenic fever ?- history of liver cirrhosis, hepatocellular carcinoma ?- oral thrush ? ?Recommendations/Plan: ?DNR/DNI as previously documented ?Pending discharge home with hospice ([facility]) today ?oxycodone IR 5 mg x 1 dose now prior to discharge ?At discharge, oxycodone IR 5-10 mg every 4 hours as needed for pain ? ? ?Prognosis: ? < 6 months ? ?Discharge Planning: ?Home with Hospice ? ? ? ?Thank you for allowing the Palliative Medicine Team to assist in the care of this patient. ? ?MDM - moderate ? ? ?Lavena Bullion, NP ? ?Please contact Palliative Medicine Team phone at [phone] for questions and concerns.  ? ? ? ? ? ?

## 2021-03-31 NOTE — Discharge Summary (Signed)
?                                                                                ? Timothy Murray OZH:[id number] DOB: 03-06-1945 DOA: 03/29/2021 ? ?PCP: Perrin Maltese, MD ? ?Admit date: 03/29/2021  Discharge date: 03/31/2021 ? ?Admitted From: Home   Disposition:  Home with Hospice ? ? ?Recommendations for Outpatient Follow-up:  ? ?Follow up with PCP in 1-2 weeks ? ?PCP Please obtain BMP/CBC, 2 view CXR in 1week,  (see Discharge instructions)  ? ?PCP Please follow up on the following pending results:  ? ? ?Home Health: None   ?Equipment/Devices: None  ?Consultations: Oncology, palliative care ?Discharge Condition: Guarded ?CODE STATUS: DNR   ?Diet Recommendation: Soft ?  ? ?Chief Complaint  ?Patient presents with  ? Weakness  ?  ? ?Brief history of present illness from the day of admission and additional interim summary   ? ?76 year old Hispanic male with history of myelodysplastic syndrome with chronic pancytopenia under the care of Dr. Oval Linsey also second opinion with Dr. Marvel Plan at [facility] referred recently to home hospice, history of hepatocellular carcinoma s/p proton beam radiation in 2021, CAD s/p CABG in 2014, thoracic aortic ulceration s/p endovascular repair in 2019, liver cirrhosis, DM type II, essential hypertension, GERD, dyslipidemia who was recently admitted to [facility] few weeks ago for CHF now presents to the hospital with weakness and subjective fevers.  In the ER he was diagnosed with severe pancytopenia including severe anemia and thrombocytopenia and was admitted to the hospital for further care. ? ?                                                               Hospital Course  ? ?  ?Myelodysplastic syndrome with severe underlying pancytopenia now with acute on chronic anemia with heme positive stool but no frank bleeding, severe thrombocytopenia and subjective neutropenic  fevers none in the hospital but some at home -  ?  ?  ?He is s/p 4 units of packed RBC transfusion along with 6 packs of platelet transfusion, he was weakly Hemoccult positive but no signs of frank bleeding, he likely lost some blood in his GI tract due to severe thrombocytopenia on top of his chronic severe anemia due to underlying pancytopenia from myelodysplastic syndrome.  On PPI which will be continued, initially family and patient wanted home hospice but briefly yesterday they wanted a second opinion at [place], [place] transfer center was called and they declined inpatient transfer.  Overnight patient and family again want to be home with hospice, I have affirmed this with patient multiple times this morning and he wants to be comfortable at home and does not want further aggressive measures at all, wife bedside agrees, he will be discharged now back to home with hospice, social worker will reinitiate hospice care after he will be discharged home with goal of care being comfort only.  He was also seen by  palliative care and oncology while he was here. ?   ?  ?Febrile neutropenia.  Some subjective fevers at home.  No clear source of infection.  Was treated with IV Maxipime here will resume his oral Levaquin at home. ?  ?Hypertension.  On beta-blocker. ?  ?GERD.  Continue PPI ?  ?Lactic acidosis.  Likely due to hypoperfusion, no fevers here no signs of frank sepsis. ?  ?CAD s/p CABG.  Supportive care with statin and beta-blocker.   ?  ?History of liver cirrhosis, hepatocellular carcinoma, supportive care.   ?  ?DM type II.  Rapidly declining patient, supportive care only for now. ? ?Oral thrush.  10 days of Diflucan. ? ?Discharge diagnosis   ? ? ?Principal Problem: ?  Acute on chronic blood loss anemia ?Active Problems: ?  Neutropenic fever ([place]) ?  Myelodysplastic syndrome ([place]) ?  Type 2 diabetes mellitus with hyperglycemia, without long-term current use of insulin ([place]) ?  Goals of care, counseling/discussion ?   Lactic acidosis ?  Coronary artery disease involving native coronary artery of native heart without angina pectoris ?  Mixed diabetic hyperlipidemia associated with type 2 diabetes mellitus ([place]) ?  Essential hypertension ?  GERD without esophagitis ? ? ? ?Discharge instructions   ? ?Discharge Instructions   ? ? Discharge instructions   Complete by: As directed ?  ? Disposition.  Residential hospice ?Condition.  Guarded ?CODE STATUS.  DNR ?Activity.  With assistance as tolerated, full fall precautions. ?Diet.  Soft with feeding assistance and aspiration precautions. ?Goal of care.  Comfort.  ? ?  ? ? ?Discharge Medications  ? ?Allergies as of 03/31/2021   ? ?   Reactions  ? Dome-paste Bandage [wound Dressings]   ? All bandaids cause a rash  ? Soap & Cleansers   ? "liquid soap" antibacterial -rash  ? Latex Rash, Itching  ? ?  ? ?  ?Medication List  ?  ? ?STOP taking these medications   ? ?atorvastatin 40 MG tablet ?Commonly known as: LIPITOR ?  ?diclofenac Sodium 1 % Gel ?Commonly known as: VOLTAREN ?  ?glimepiride 1 MG tablet ?Commonly known as: AMARYL ?  ?Promacta 50 MG tablet ?Generic drug: eltrombopag ?  ? ?  ? ?TAKE these medications   ? ?acetaminophen 325 MG tablet ?Commonly known as: TYLENOL ?Take 2 tablets (650 mg total) by mouth every 6 (six) hours as needed for headache or mild pain. ?  ?acyclovir 400 MG tablet ?Commonly known as: ZOVIRAX ?TAKE 1 TABLET BY MOUTH TWICE A DAY ?  ?fluconazole 100 MG tablet ?Commonly known as: DIFLUCAN ?Take 1 tablet (100 mg total) by mouth daily. ?  ?Fluzone High-Dose Quadrivalent 0.7 ML [place] ?Generic drug: Influenza Vac High-Dose Quad ?Inject 1 each into the skin once. ?  ?furosemide 40 MG tablet ?Commonly known as: LASIX ?Take 1 tablet (40 mg total) by mouth daily. ?  ?gabapentin 100 MG capsule ?Commonly known as: NEURONTIN ?Take 300 mg by mouth 3 (three) times daily. ?  ?lactulose 10 GM/15ML solution ?Commonly known as: [place] ?Take 15 - 30 ml daily as needed for  constipation ?What changed:  ?how much to take ?how to take this ?when to take this ?reasons to take this ?additional instructions ?  ?levofloxacin 500 MG tablet ?Commonly known as: LEVAQUIN ?TAKE 1 TABLET (500 MG TOTAL) BY MOUTH DAILY. ?  ?loratadine 10 MG tablet ?Commonly known as: CLARITIN ?Take 5 mg by mouth daily as needed for allergies. Take half a tablet (21m) daily ?  ?  metoprolol tartrate 25 MG tablet ?Commonly known as: LOPRESSOR ?Take 25 mg by mouth 2 (two) times daily. ?  ?morphine CONCENTRATE 10 mg / 0.5 ml concentrated solution ?Take 20 mg by mouth every 4 (four) hours as needed for pain. ?  ?nitroGLYCERIN 0.3 MG SL tablet ?Commonly known as: NITROSTAT ?0.3 mg every 5 (five) minutes as needed for chest pain. ?  ?omeprazole 40 MG capsule ?Commonly known as: PRILOSEC ?TAKE 1 CAPSULE BY MOUTH TWICE A DAY ?What changed:  ?how to take this ?when to take this ?reasons to take this ?  ?OneTouch Delica Plus [id number] Misc ?Apply 1 each topically 2 (two) times daily. ?  ?OneTouch Verio Flex System w/Device Kit ?1 each by Does not apply route in the morning and at bedtime. ?  ?OneTouch Verio test strip ?Generic drug: glucose blood ?1 each by Other route 2 (two) times daily. ?  ?oxyCODONE 5 MG immediate release tablet ?Commonly known as: Oxy IR/ROXICODONE ?Take 1 tablet (5 mg total) by mouth every 4 (four) hours as needed for severe pain. ?  ?traZODone 50 MG tablet ?Commonly known as: DESYREL ?TAKE 1 TO 2 TABLETS BY MOUTH AT BEDTIME ?What changed:  ?how much to take ?when to take this ?reasons to take this ?  ? ?  ? ? ? ? ?Major procedures and Radiology Reports - PLEASE review detailed and final reports thoroughly  -    ? ?  ?DG Chest 2 View ? ?Result Date: 03/13/2021 ?CLINICAL DATA:  Chest pain. EXAM: CHEST - 2 VIEW COMPARISON:  February 27, 2021. FINDINGS: The heart size and mediastinal contours are within normal limits. Status post coronary bypass graft. Stable stent graft seen in descending thoracic aorta. Both  lungs are clear. The visualized skeletal structures are unremarkable. IMPRESSION: No active cardiopulmonary disease. Electronically Signed   By: Marijo Conception M.D.   On: 03/13/2021 16:40  ? ?MR Lumbar Spine W

## 2021-03-31 NOTE — Plan of Care (Signed)

## 2021-04-01 DIAGNOSIS — M7061 Trochanteric bursitis, right hip: Secondary | ICD-10-CM | POA: Diagnosis not present

## 2021-04-01 DIAGNOSIS — E119 Type 2 diabetes mellitus without complications: Secondary | ICD-10-CM | POA: Diagnosis not present

## 2021-04-01 DIAGNOSIS — M7062 Trochanteric bursitis, left hip: Secondary | ICD-10-CM | POA: Diagnosis not present

## 2021-04-01 LAB — HAPTOGLOBIN: Haptoglobin: 240 mg/dL (ref 34–355)

## 2021-04-02 LAB — BPAM RBC
Blood Product Expiration Date: 202304022359
Blood Product Expiration Date: 202304172359
Blood Product Expiration Date: 202304232359
Blood Product Expiration Date: 202304232359
Blood Product Expiration Date: 202304232359
Blood Product Expiration Date: 202304232359
ISSUE DATE / TIME: 202303262233
ISSUE DATE / TIME: 202303262233
ISSUE DATE / TIME: 202303271345
Unit Type and Rh: 5100
Unit Type and Rh: 6200
Unit Type and Rh: 6200
Unit Type and Rh: 6200
Unit Type and Rh: 6200
Unit Type and Rh: 6200

## 2021-04-02 LAB — TYPE AND SCREEN
ABO/RH(D): A POS
Antibody Screen: NEGATIVE
Unit division: 0
Unit division: 0
Unit division: 0
Unit division: 0
Unit division: 0
Unit division: 0

## 2021-04-03 LAB — CULTURE, BLOOD (ROUTINE X 2)
Culture: NO GROWTH
Culture: NO GROWTH
Special Requests: ADEQUATE
Special Requests: ADEQUATE

## 2021-04-05 ENCOUNTER — Other Ambulatory Visit: Payer: Self-pay | Admitting: Oncology

## 2021-04-05 MED ORDER — OXYCODONE HCL 5 MG PO TABS
5.0000 mg | ORAL_TABLET | ORAL | 0 refills | Status: AC | PRN
Start: 2021-04-05 — End: ?

## 2021-04-06 ENCOUNTER — Telehealth: Payer: Self-pay

## 2021-04-06 NOTE — Telephone Encounter (Signed)
Dr Rao's patient daughter called and left a vm. I forward the the vm to Dr Elroy Channel nurse Ivin Booty. I also called the patient's daughter to let her know Dr Elroy Channel  nurse should be reach out to her. I advice the patient's daughter if she have anymore concerns or questions to reach out Dr Elroy Channel team and they should take care of it. Patient's daughter gave verbal understanding and had no further questions or concerns. ?

## 2021-04-08 ENCOUNTER — Other Ambulatory Visit: Payer: Self-pay | Admitting: *Deleted

## 2021-04-08 MED ORDER — LACTULOSE 10 GM/15ML PO SOLN: ORAL | 0 refills | Status: AC

## 2021-04-09 ENCOUNTER — Other Ambulatory Visit: Payer: Self-pay | Admitting: Nurse Practitioner

## 2021-04-14 ENCOUNTER — Emergency Department: Payer: Medicare Other

## 2021-04-14 ENCOUNTER — Observation Stay
Admission: EM | Admit: 2021-04-14 | Discharge: 2021-04-15 | Disposition: A | Discharge status: Home / Self Care | Payer: Medicare Other | Attending: Internal Medicine | Admitting: Internal Medicine

## 2021-04-14 DIAGNOSIS — E871 Hypo-osmolality and hyponatremia: Secondary | ICD-10-CM | POA: Diagnosis not present

## 2021-04-14 DIAGNOSIS — R778 Other specified abnormalities of plasma proteins: Secondary | ICD-10-CM

## 2021-04-14 DIAGNOSIS — R7989 Other specified abnormal findings of blood chemistry: Secondary | ICD-10-CM | POA: Diagnosis present

## 2021-04-14 DIAGNOSIS — E1110 Type 2 diabetes mellitus with ketoacidosis without coma: Principal | ICD-10-CM | POA: Insufficient documentation

## 2021-04-14 DIAGNOSIS — J9621 Acute and chronic respiratory failure with hypoxia: Secondary | ICD-10-CM | POA: Diagnosis not present

## 2021-04-14 DIAGNOSIS — Z20822 Contact with and (suspected) exposure to covid-19: Secondary | ICD-10-CM | POA: Insufficient documentation

## 2021-04-14 DIAGNOSIS — Z515 Encounter for palliative care: Secondary | ICD-10-CM

## 2021-04-14 DIAGNOSIS — Z743 Need for continuous supervision: Secondary | ICD-10-CM | POA: Diagnosis not present

## 2021-04-14 DIAGNOSIS — J9601 Acute respiratory failure with hypoxia: Secondary | ICD-10-CM | POA: Diagnosis not present

## 2021-04-14 DIAGNOSIS — E138 Other specified diabetes mellitus with unspecified complications: Secondary | ICD-10-CM | POA: Diagnosis not present

## 2021-04-14 DIAGNOSIS — D469 Myelodysplastic syndrome, unspecified: Secondary | ICD-10-CM | POA: Diagnosis not present

## 2021-04-14 DIAGNOSIS — D649 Anemia, unspecified: Secondary | ICD-10-CM | POA: Diagnosis not present

## 2021-04-14 DIAGNOSIS — E785 Hyperlipidemia, unspecified: Secondary | ICD-10-CM | POA: Diagnosis not present

## 2021-04-14 DIAGNOSIS — E1310 Other specified diabetes mellitus with ketoacidosis without coma: Secondary | ICD-10-CM

## 2021-04-14 DIAGNOSIS — I2510 Atherosclerotic heart disease of native coronary artery without angina pectoris: Secondary | ICD-10-CM

## 2021-04-14 DIAGNOSIS — D61818 Other pancytopenia: Secondary | ICD-10-CM | POA: Insufficient documentation

## 2021-04-14 DIAGNOSIS — R531 Weakness: Secondary | ICD-10-CM | POA: Diagnosis not present

## 2021-04-14 DIAGNOSIS — N400 Enlarged prostate without lower urinary tract symptoms: Secondary | ICD-10-CM | POA: Diagnosis not present

## 2021-04-14 DIAGNOSIS — I25810 Atherosclerosis of coronary artery bypass graft(s) without angina pectoris: Secondary | ICD-10-CM | POA: Diagnosis not present

## 2021-04-14 DIAGNOSIS — R0602 Shortness of breath: Secondary | ICD-10-CM | POA: Diagnosis present

## 2021-04-14 DIAGNOSIS — I10 Essential (primary) hypertension: Secondary | ICD-10-CM | POA: Diagnosis not present

## 2021-04-14 DIAGNOSIS — R000 Tachycardia, unspecified: Secondary | ICD-10-CM | POA: Diagnosis not present

## 2021-04-14 LAB — CBG MONITORING, ED
Glucose-Capillary: 393 mg/dL — ABNORMAL HIGH (ref 70–99)
Glucose-Capillary: 352 mg/dL — ABNORMAL HIGH (ref 70–99)
Glucose-Capillary: 358 mg/dL — ABNORMAL HIGH (ref 70–99)
Glucose-Capillary: 312 mg/dL — ABNORMAL HIGH (ref 70–99)
Glucose-Capillary: 254 mg/dL — ABNORMAL HIGH (ref 70–99)
Glucose-Capillary: 220 mg/dL — ABNORMAL HIGH (ref 70–99)
Glucose-Capillary: 198 mg/dL — ABNORMAL HIGH (ref 70–99)
Glucose-Capillary: 175 mg/dL — ABNORMAL HIGH (ref 70–99)
Glucose-Capillary: 172 mg/dL — ABNORMAL HIGH (ref 70–99)
Glucose-Capillary: 167 mg/dL — ABNORMAL HIGH (ref 70–99)

## 2021-04-14 LAB — URINALYSIS, ROUTINE W REFLEX MICROSCOPIC
Bacteria, UA: NONE SEEN
Bilirubin Urine: NEGATIVE
Glucose, UA: >=500 mg/dL — AB
Hgb urine dipstick: NEGATIVE
Ketones, ur: NEGATIVE mg/dL
Leukocytes,Ua: NEGATIVE
Nitrite: NEGATIVE
Protein, ur: NEGATIVE mg/dL
Specific Gravity, Urine: 1.013 (ref 1.005–1.030)
pH: 5 (ref 5.0–8.0)

## 2021-04-14 LAB — CBC
HCT: 12.4 % — CL (ref 39.0–52.0)
Hemoglobin: 4 g/dL — CL (ref 13.0–17.0)
MCH: 30.8 pg (ref 26.0–34.0)
MCHC: 32.3 g/dL (ref 30.0–36.0)
MCV: 95.4 fL (ref 80.0–100.0)
Platelets: <5 10*3/uL — CL (ref 150–400)
RBC: 1.3 MIL/uL — ABNORMAL LOW (ref 4.22–5.81)
RDW: 13.9 % (ref 11.5–15.5)
WBC: 1.7 10*3/uL — ABNORMAL LOW (ref 4.0–10.5)
nRBC: 1.2 % — ABNORMAL HIGH (ref 0.0–0.2)

## 2021-04-14 LAB — BASIC METABOLIC PANEL
Anion gap: 18 — ABNORMAL HIGH (ref 5–15)
Anion gap: 11 (ref 5–15)
Anion gap: 9 (ref 5–15)
BUN: 27 mg/dL — ABNORMAL HIGH (ref 8–23)
BUN: 30 mg/dL — ABNORMAL HIGH (ref 8–23)
BUN: 29 mg/dL — ABNORMAL HIGH (ref 8–23)
CO2: 14 mmol/L — ABNORMAL LOW (ref 22–32)
CO2: 22 mmol/L (ref 22–32)
CO2: 24 mmol/L (ref 22–32)
Calcium: 8.4 mg/dL — ABNORMAL LOW (ref 8.9–10.3)
Calcium: 8.4 mg/dL — ABNORMAL LOW (ref 8.9–10.3)
Calcium: 8.3 mg/dL — ABNORMAL LOW (ref 8.9–10.3)
Chloride: 97 mmol/L — ABNORMAL LOW (ref 98–111)
Chloride: 97 mmol/L — ABNORMAL LOW (ref 98–111)
Chloride: 97 mmol/L — ABNORMAL LOW (ref 98–111)
Creatinine, Ser: 1.27 mg/dL — ABNORMAL HIGH (ref 0.61–1.24)
Creatinine, Ser: 1.07 mg/dL (ref 0.61–1.24)
Creatinine, Ser: 1 mg/dL (ref 0.61–1.24)
GFR, Estimated: 59 mL/min — ABNORMAL LOW (ref >60)
GFR, Estimated: >60 mL/min (ref >60)
GFR, Estimated: >60 mL/min (ref >60)
Glucose, Bld: 434 mg/dL — ABNORMAL HIGH (ref 70–99)
Glucose, Bld: 275 mg/dL — ABNORMAL HIGH (ref 70–99)
Glucose, Bld: 201 mg/dL — ABNORMAL HIGH (ref 70–99)
Potassium: 4.4 mmol/L (ref 3.5–5.1)
Potassium: 4.4 mmol/L (ref 3.5–5.1)
Potassium: 4.4 mmol/L (ref 3.5–5.1)
Sodium: 129 mmol/L — ABNORMAL LOW (ref 135–145)
Sodium: 130 mmol/L — ABNORMAL LOW (ref 135–145)
Sodium: 130 mmol/L — ABNORMAL LOW (ref 135–145)

## 2021-04-14 LAB — COMPREHENSIVE METABOLIC PANEL
ALT: 20 U/L (ref 0–44)
AST: 36 U/L (ref 15–41)
Albumin: 2.9 g/dL — ABNORMAL LOW (ref 3.5–5.0)
Alkaline Phosphatase: 198 U/L — ABNORMAL HIGH (ref 38–126)
Anion gap: 22 — ABNORMAL HIGH (ref 5–15)
BUN: 25 mg/dL — ABNORMAL HIGH (ref 8–23)
CO2: 11 mmol/L — ABNORMAL LOW (ref 22–32)
Calcium: 8.6 mg/dL — ABNORMAL LOW (ref 8.9–10.3)
Chloride: 95 mmol/L — ABNORMAL LOW (ref 98–111)
Creatinine, Ser: 1.27 mg/dL — ABNORMAL HIGH (ref 0.61–1.24)
GFR, Estimated: 59 mL/min — ABNORMAL LOW (ref >60)
Glucose, Bld: 461 mg/dL — ABNORMAL HIGH (ref 70–99)
Potassium: 4.5 mmol/L (ref 3.5–5.1)
Sodium: 128 mmol/L — ABNORMAL LOW (ref 135–145)
Total Bilirubin: 2.5 mg/dL — ABNORMAL HIGH (ref 0.3–1.2)
Total Protein: 6.5 g/dL (ref 6.5–8.1)

## 2021-04-14 LAB — RESP PANEL BY RT-PCR (FLU A&B, COVID) ARPGX2
Influenza A by PCR: NEGATIVE
Influenza B by PCR: NEGATIVE
SARS Coronavirus 2 by RT PCR: NEGATIVE

## 2021-04-14 LAB — TROPONIN I (HIGH SENSITIVITY)
Troponin I (High Sensitivity): 1783 ng/L (ref <18)
Troponin I (High Sensitivity): 1889 ng/L (ref <18)

## 2021-04-14 LAB — BLOOD GAS, VENOUS
Acid-base deficit: 8.8 mmol/L — ABNORMAL HIGH (ref 0.0–2.0)
Bicarbonate: 15.8 mmol/L — ABNORMAL LOW (ref 20.0–28.0)
O2 Saturation: 78.7 %
Patient temperature: 37
pCO2, Ven: 30 mmHg — ABNORMAL LOW (ref 44–60)
pH, Ven: 7.33 (ref 7.25–7.43)
pO2, Ven: 45 mmHg (ref 32–45)

## 2021-04-14 LAB — BETA-HYDROXYBUTYRIC ACID
Beta-Hydroxybutyric Acid: 0.4 mmol/L — ABNORMAL HIGH (ref 0.05–0.27)
Beta-Hydroxybutyric Acid: 0.08 mmol/L (ref 0.05–0.27)

## 2021-04-14 LAB — BRAIN NATRIURETIC PEPTIDE: B Natriuretic Peptide: 298.2 pg/mL — ABNORMAL HIGH (ref 0.0–100.0)

## 2021-04-14 LAB — PREPARE RBC (CROSSMATCH)

## 2021-04-14 IMAGING — DX DG CHEST 1V PORT
1 series · 2 of 2 positions shown · non-contrast
Comparison: Previous studies including the examination of
[DATE]

CLINICAL DATA: Difficulty breathing

EXAM:
PORTABLE CHEST 1 VIEW

[Series 1: chest ap · 0.14mm/px · 2 of 2 slices shown]
[im 1/2]
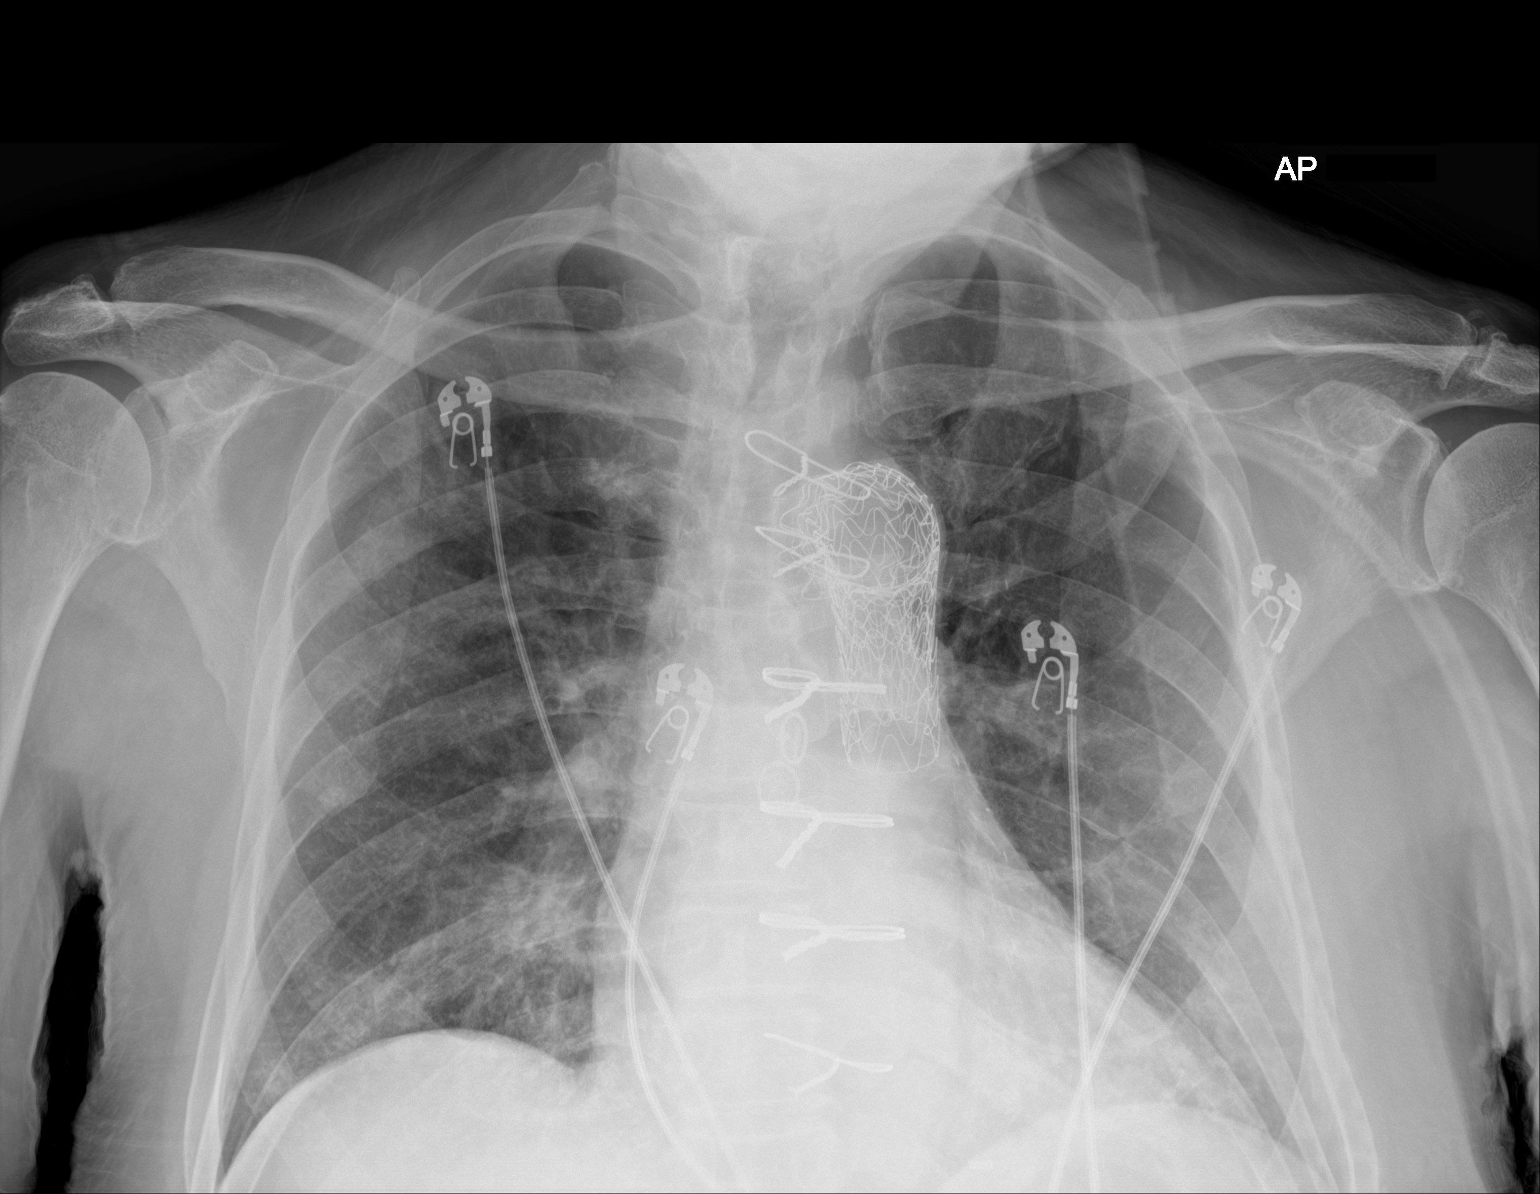
[im 2/2]
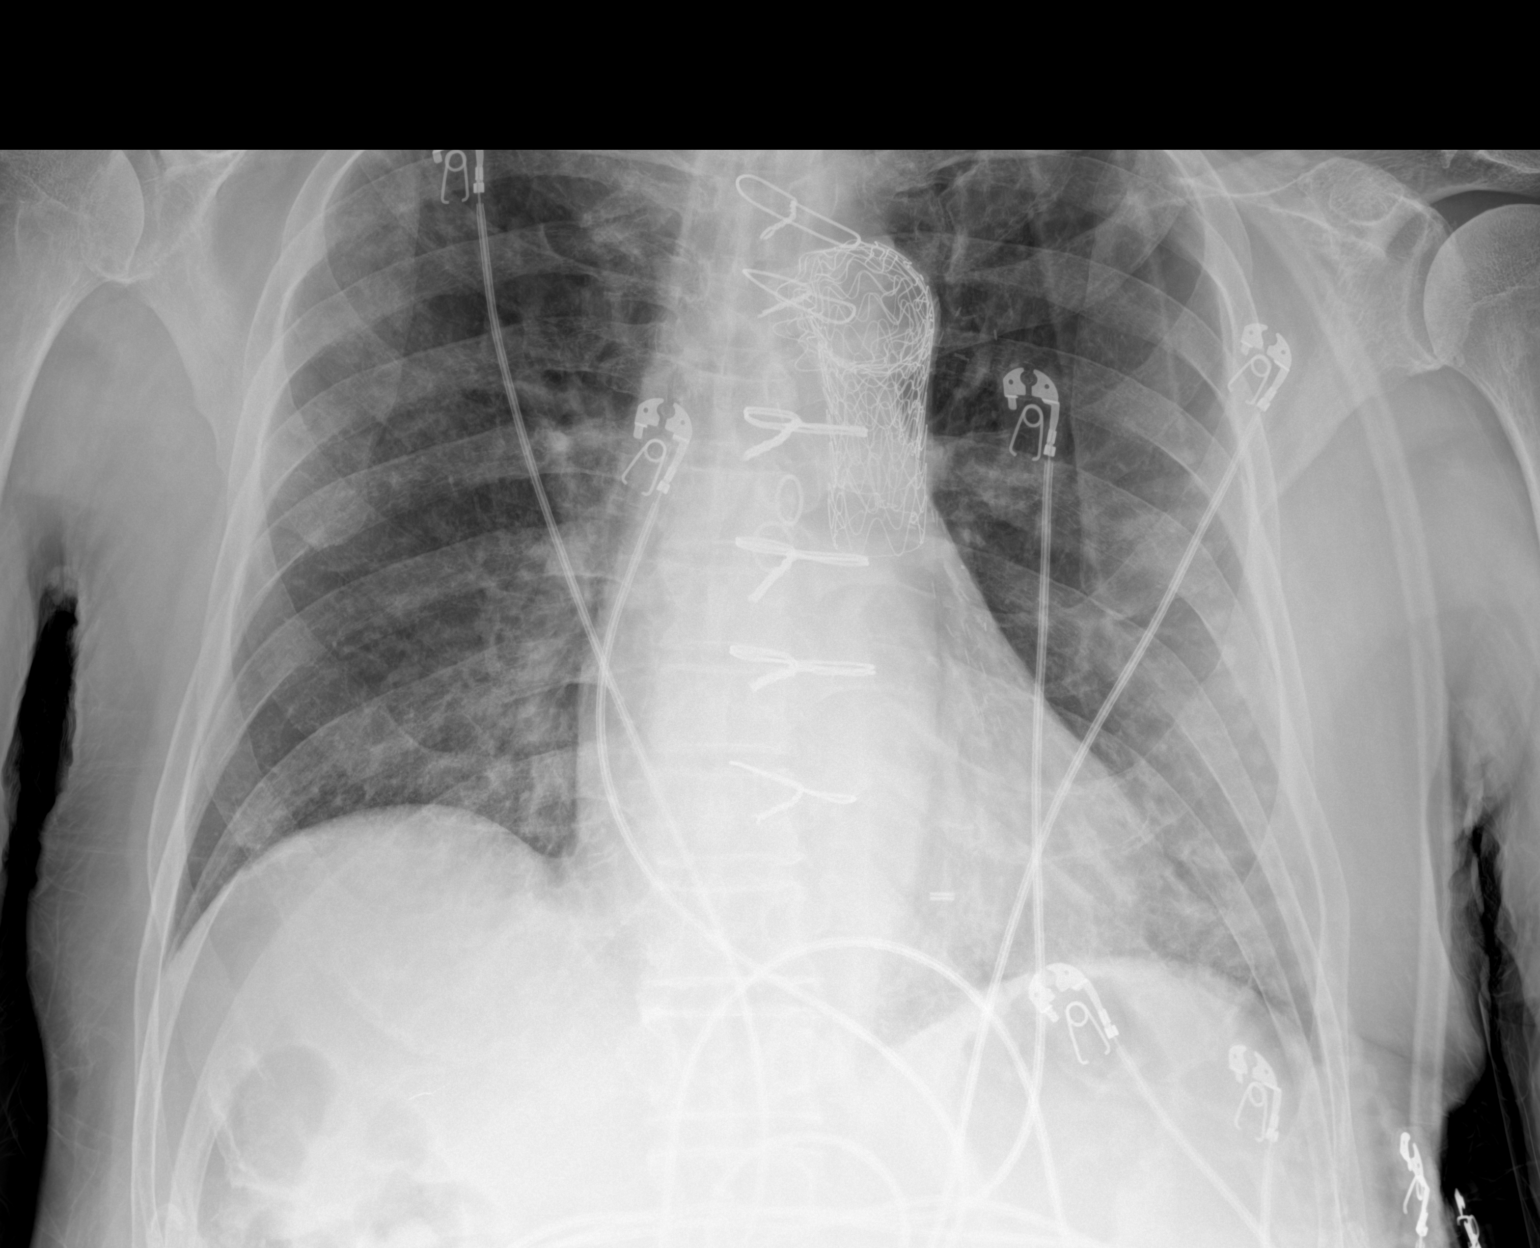

[2 of 2 positions shown; findings below may reference images not displayed]

FINDINGS: Transverse diameter of heart is within normal limits. There is
evidence of previous endovascular stent repair in thoracic aorta.
There is evidence of previous coronary bypass surgery. There are no
signs of alveolar pulmonary edema or focal pulmonary consolidation.
There is no pleural effusion or pneumothorax.
IMPRESSION: There are no signs of pulmonary edema or focal pulmonary
consolidation.

## 2021-04-14 MED ORDER — MORPHINE SULFATE (PF) 4 MG/ML IV SOLN
4.0000 mg | INTRAVENOUS | Status: DC | PRN
  Administered 2021-04-14: 4 mg via INTRAVENOUS
  Filled 2021-04-14: qty 1

## 2021-04-14 MED ORDER — INSULIN DETEMIR 100 UNIT/ML ~~LOC~~ SOLN
0.3000 [IU]/kg | SUBCUTANEOUS | Status: DC
  Administered 2021-04-15: 25 [IU] via SUBCUTANEOUS
  Filled 2021-04-14: qty 0.25

## 2021-04-14 MED ORDER — POTASSIUM CHLORIDE 10 MEQ/100ML IV SOLN
10.0000 meq | INTRAVENOUS | Status: AC
  Administered 2021-04-14 (×2): 10 meq via INTRAVENOUS
  Filled 2021-04-14 (×2): qty 100

## 2021-04-14 MED ORDER — LACTATED RINGERS IV BOLUS: 1750.0000 mL | Freq: Once | INTRAVENOUS | Status: DC

## 2021-04-14 MED ORDER — LACTULOSE 10 GM/15ML PO SOLN: 30.0000 g | Freq: Every day | ORAL | Status: DC | PRN

## 2021-04-14 MED ORDER — DEXTROSE IN LACTATED RINGERS 5 % IV SOLN: INTRAVENOUS | Status: DC

## 2021-04-14 MED ORDER — LACTATED RINGERS IV SOLN: INTRAVENOUS | Status: DC

## 2021-04-14 MED ORDER — ACETAMINOPHEN 325 MG PO TABS: 650.0000 mg | ORAL_TABLET | Freq: Four times a day (QID) | ORAL | Status: DC | PRN

## 2021-04-14 MED ORDER — ONDANSETRON HCL 4 MG/2ML IJ SOLN
4.0000 mg | Freq: Once | INTRAMUSCULAR | Status: AC
  Administered 2021-04-14: 4 mg via INTRAVENOUS
  Filled 2021-04-14: qty 2

## 2021-04-14 MED ORDER — DEXTROSE 50 % IV SOLN: 0.0000 mL | INTRAVENOUS | Status: DC | PRN

## 2021-04-14 MED ORDER — HYDROMORPHONE HCL 1 MG/ML IJ SOLN: 1.0000 mg | INTRAMUSCULAR | Status: DC | PRN

## 2021-04-14 MED ORDER — GABAPENTIN 300 MG PO CAPS
300.0000 mg | ORAL_CAPSULE | Freq: Three times a day (TID) | ORAL | Status: DC
  Administered 2021-04-14 (×2): 300 mg via ORAL
  Filled 2021-04-14 (×2): qty 1

## 2021-04-14 MED ORDER — NITROGLYCERIN 0.4 MG SL SUBL: 0.4000 mg | SUBLINGUAL_TABLET | SUBLINGUAL | Status: DC | PRN

## 2021-04-14 MED ORDER — TRAZODONE HCL 50 MG PO TABS: 50.0000 mg | ORAL_TABLET | Freq: Every evening | ORAL | Status: DC | PRN

## 2021-04-14 MED ORDER — INSULIN REGULAR(HUMAN) IN NACL 100-0.9 UT/100ML-% IV SOLN: INTRAVENOUS | Status: DC

## 2021-04-14 MED ORDER — HYDROMORPHONE HCL 1 MG/ML IJ SOLN: 0.5000 mg | INTRAMUSCULAR | Status: DC | PRN

## 2021-04-14 MED ORDER — INSULIN ASPART 100 UNIT/ML IJ SOLN: 0.0000 [IU] | Freq: Every day | INTRAMUSCULAR | Status: DC

## 2021-04-14 MED ORDER — POTASSIUM CHLORIDE 10 MEQ/100ML IV SOLN: 10.0000 meq | INTRAVENOUS | Status: DC

## 2021-04-14 MED ORDER — SODIUM CHLORIDE 0.9% IV SOLUTION
Freq: Once | INTRAVENOUS | Status: DC
  Filled 2021-04-14: qty 250

## 2021-04-14 MED ORDER — OXYCODONE HCL 5 MG PO TABS
5.0000 mg | ORAL_TABLET | ORAL | Status: DC | PRN
  Administered 2021-04-14 – 2021-04-15 (×2): 5 mg via ORAL
  Filled 2021-04-14 (×2): qty 1

## 2021-04-14 MED ORDER — INSULIN REGULAR(HUMAN) IN NACL 100-0.9 UT/100ML-% IV SOLN
INTRAVENOUS | Status: DC
  Administered 2021-04-14: 6 [IU]/h via INTRAVENOUS
  Filled 2021-04-14 (×2): qty 100

## 2021-04-14 MED ORDER — METOPROLOL TARTRATE 25 MG PO TABS
25.0000 mg | ORAL_TABLET | Freq: Two times a day (BID) | ORAL | Status: DC
  Administered 2021-04-14 (×2): 25 mg via ORAL
  Filled 2021-04-14 (×2): qty 1

## 2021-04-14 MED ORDER — INSULIN ASPART 100 UNIT/ML IJ SOLN: 0.0000 [IU] | Freq: Three times a day (TID) | INTRAMUSCULAR | Status: DC

## 2021-04-14 MED ORDER — SODIUM CHLORIDE 0.9 % IV SOLN
10.0000 mL/h | Freq: Once | INTRAVENOUS | Status: DC
Start: 2021-04-14 — End: 2021-04-15

## 2021-04-14 NOTE — ED Provider Notes (Signed)
? ?[facility] ?Provider Note ? ? ? Event Date/Time  ? First MD Initiated Contact with Patient 04/14/21 1027   ?  (approximate) ? ? ?History  ? ?Shortness of Breath ? ? ?HPI ? ?Timothy Murray is a 76 y.o. male with extensive past medical history including mild dysplastic syndrome history of pancytopenia requiring blood transfusions presents to the ER for worsening exertional dyspnea now at rest.  Wears 2 L nasal cannula at home now requiring 4 L nasal cannula significantly tachypneic.  Appears pale critically ill-appearing.  No report of any fevers.  States he ran out of his Lasix several days ago.  He is currently enrolled in hospice but they are reportedly traveling to [facility] [place] for second opinion next week. ?  ? ? ?Physical Exam  ? ?Triage Vital Signs: ?ED Triage Vitals  ?Enc Vitals Group  ?   BP 04/14/21 1020 (!) 131/56  ?   Pulse Rate 04/14/21 1020 (!) 113  ?   Resp 04/14/21 1020 (!) 32  ?   Temp 04/14/21 1031 97.8 ?F (36.6 ?C)  ?   Temp Source 04/14/21 1031 Axillary  ?   SpO2 04/14/21 1020 (!) 86 %  ?   Weight --   ?   Height --   ?   Head Circumference --   ?   Peak Flow --   ?   Pain Score 04/14/21 1019 8  ?   Pain Loc --   ?   Pain Edu? --   ?   Excl. in [place]? --   ? ? ?Most recent vital signs: ?Vitals:  ? 04/14/21 1130 04/14/21 1241  ?BP: (!) 169/65 (!) 138/50  ?Pulse: (!) 117 (!) 112  ?Resp: (!) 27 (!) 22  ?Temp:  97.8 ?F (36.6 ?C)  ?SpO2: 100% 100%  ? ? ? ?Constitutional: Alert but critically ill appearing in mod respiratory distress ?Eyes: Conjunctivae are normal.  ?Head: Atraumatic. ?Nose: No congestion/rhinnorhea. ?Mouth/Throat: Mucous membranes are moist.   ?Neck: Painless ROM.  ?Cardiovascular:   Good peripheral circulation. ?Respiratory: tachypnea with diminished bibasilar bs ?Gastrointestinal: Soft and nontender.  ?Musculoskeletal:  no deformity ?Neurologic:  MAE spontaneously. No gross focal neurologic deficits are appreciated.  ?Skin:  Skin is warm, dry and intact. No rash  noted. ?Psychiatric: Mood and affect are normal. Speech and behavior are normal. ? ? ? ?ED Results / Procedures / Treatments  ? ?Labs ?(all labs ordered are listed, but only abnormal results are displayed) ?Labs Reviewed  ?CBC - Abnormal; Notable for the following components:  ?    Result Value  ? WBC 1.7 (*)   ? RBC 1.30 (*)   ? Hemoglobin 4.0 (*)   ? HCT 12.4 (*)   ? Platelets <5 (*)   ? nRBC 1.2 (*)   ? All other components within normal limits  ?COMPREHENSIVE METABOLIC PANEL - Abnormal; Notable for the following components:  ? Sodium 128 (*)   ? Chloride 95 (*)   ? CO2 11 (*)   ? Glucose, Bld 461 (*)   ? BUN 25 (*)   ? Creatinine, Ser 1.27 (*)   ? Calcium 8.6 (*)   ? Albumin 2.9 (*)   ? Alkaline Phosphatase 198 (*)   ? Total Bilirubin 2.5 (*)   ? GFR, Estimated 59 (*)   ? Anion gap 22 (*)   ? All other components within normal limits  ?BRAIN NATRIURETIC PEPTIDE - Abnormal; Notable for the following components:  ? B  Natriuretic Peptide 298.2 (*)   ? All other components within normal limits  ?TROPONIN I (HIGH SENSITIVITY) - Abnormal; Notable for the following components:  ? Troponin I (High Sensitivity) 1,783 (*)   ? All other components within normal limits  ?RESP PANEL BY RT-PCR (FLU A&B, COVID) ARPGX2  ?BASIC METABOLIC PANEL  ?BASIC METABOLIC PANEL  ?BASIC METABOLIC PANEL  ?BASIC METABOLIC PANEL  ?BETA-HYDROXYBUTYRIC ACID  ?BETA-HYDROXYBUTYRIC ACID  ?URINALYSIS, ROUTINE W REFLEX MICROSCOPIC  ?BLOOD GAS, VENOUS  ?CBG MONITORING, ED  ?TYPE AND SCREEN  ?PREPARE RBC (CROSSMATCH)  ?PREPARE PLATELET PHERESIS  ?TROPONIN I (HIGH SENSITIVITY)  ? ? ? ?EKG ? ?ED ECG REPORT ?I, Merlyn Lot, the attending physician, personally viewed and interpreted this ECG. ? ? Date: 04/14/2021 ? EKG Time: 10:41 ? Rate: 120 ? Rhythm: sinus ? Axis: normal ? Intervals: normal ? ST&T Change: diffuse depression suggesting global ischemia ? ? ? ?RADIOLOGY ?Please see ED Course for my review and interpretation. ? ?I personally reviewed  all radiographic images ordered to evaluate for the above acute complaints and reviewed radiology reports and findings.  These findings were personally discussed with the patient.  Please see medical record for radiology report. ? ? ? ?PROCEDURES: ? ?Critical Care performed: Yes, see critical care procedure note(s) ? ?.1-3 Lead EKG Interpretation ?Performed by: Merlyn Lot, MD ?Authorized by: Merlyn Lot, MD  ? ?  Interpretation: abnormal   ?  ECG rate:  115 ?  ECG rate assessment: tachycardic   ?  Rhythm: sinus rhythm   ?.Critical Care ?Performed by: Merlyn Lot, MD ?Authorized by: Merlyn Lot, MD  ? ?Critical care provider statement:  ?  Critical care time (minutes):  35 ?  Critical care was necessary to treat or prevent imminent or life-threatening deterioration of the following conditions:  Respiratory failure and metabolic crisis ?  Critical care was time spent personally by me on the following activities:  Ordering and performing treatments and interventions, ordering and review of laboratory studies, ordering and review of radiographic studies, pulse oximetry, re-evaluation of patient's condition, review of old charts, obtaining history from patient or surrogate, examination of patient, evaluation of patient's response to treatment, discussions with primary provider, discussions with consultants and development of treatment plan with patient or surrogate ? ? ?MEDICATIONS ORDERED IN ED: ?Medications  ?morphine (PF) 4 MG/ML injection 4 mg (4 mg Intravenous Given 04/14/21 1113)  ?0.9 %  sodium chloride infusion (0 mL/hr Intravenous Hold 04/14/21 1248)  ?0.9 %  sodium chloride infusion (Manually program via Guardrails IV Fluids) (0 mLs Intravenous Hold 04/14/21 1248)  ?HYDROmorphone (DILAUDID) injection 0.5 mg (has no administration in time range)  ?lactated ringers bolus 1,750 mL (has no administration in time range)  ?insulin regular, human (MYXREDLIN) 100 units/ 100 mL infusion (has no  administration in time range)  ?lactated ringers infusion (has no administration in time range)  ?dextrose 5 % in lactated ringers infusion (0 mLs Intravenous Hold 04/14/21 1249)  ?dextrose 50 % solution 0-50 mL (has no administration in time range)  ?potassium chloride 10 mEq in 100 mL IVPB (has no administration in time range)  ?ondansetron (ZOFRAN) injection 4 mg (4 mg Intravenous Given 04/14/21 1113)  ? ? ? ?IMPRESSION / MDM / ASSESSMENT AND PLAN / ED COURSE  ?I reviewed the triage vital signs and the nursing notes. ?             ?               ? ?  Differential diagnosis includes, but is not limited to, Asthma, copd, CHF, pna, ptx, malignancy, Pe, anemia ? ?Presenting to the ER with symptoms as described above in moderate respiratory distress.  Patient with acute respiratory failure with hypoxia.  Placed on BiPAP.  Clinically does appear volume overloaded significant lower extremity edema.  Is afebrile mildly tachycardic also concern for symptomatic anemia.  Blood work sent for the blood differential placed on BiPAP or chest x-ray.  Placed on cardiac monitor. ? ?Clinical Course as of 04/14/21 1259  ?Tue Apr 14, 2021  ?1051 Chest x-ray on my review interpretation does not show significant cardiomegaly or effusions. [PR]  ?1104 Based on has history malignancy hypoxia and tachypnea will order CTA to evaluate for PE. [PR]  ?1134 Patient with critically low anemia with hemoglobin of 4 as well as thrombocytopenia with platelets less than 5.  Have ordered blood transfusion as well as platelet transfusion.  We will cancel CTA given evidence of AKI significant profound thrombocytopenia pancytopenia would not be a candidate for anticoagulation.  When discussed his work-up and poor prognosis with the patient.  Patient states that his goal is to no longer suffer.  Family stating that they were planning on taking the patient for a second opinion at [facility] next week in [place].  Unfortunately patient's clinical  deterioration I think is going to prohibit this he does consent to receiving blood transfusion and platelet transfusion.  Will give additional IV Dilaudid for pain control.  Will consult hospitalist for admission fo

## 2021-04-14 NOTE — H&P (Addendum)
For ?History and Physical  ? ? ?PatientPosey Murray TWS:[id number] DOB: 1945-02-15 ?DOA: 04/14/2021 ?DOS: the patient was seen and examined on 04/14/2021 ?PCP: Perrin Maltese, MD  ?Patient coming from: Home ? ?Chief Complaint:  ?Chief Complaint  ?Patient presents with  ? Shortness of Breath  ?Most of the history was obtained from his wife at the bedside. ?HPI: Timothy Murray is a 76 y.o. male with medical history significant for liver cirrhosis and hepatocellular carcinoma for which he had proton beam radiation in June 2021.  He had baseline thrombocytopenia which was initially attributed to his underlying liver cirrhosis but has been diagnosed with myelodysplastic syndrome as well.  He also has a history of chronic diastolic dysfunction CHF, diabetes mellitus, coronary artery disease and hypertension. ?He was recently discharged from [facility] and has been home with hospice.  He presents to the emergency room for evaluation of shortness of breath, chest pain and pain in his lower back. ?In the ER he was noted to be tachycardic and his oxygen was increased from 2 to 4 L. ?She states that he has been increasingly lethargic and weak and requires assistance with activities of daily living.  He has not had any nausea or vomiting and oral intake has been fair.  She states that he has not had any fever or chills. ?He has not had any nosebleeds, no rectal bleeding or hematuria. ?Patient complains of generalized pain and states that he is tired of suffering. ?Wife plans on taking him to [place] for further evaluation ? ?Review of Systems: As mentioned in the history of present illness. All other systems reviewed and are negative. ?Past Medical History:  ?Diagnosis Date  ? Arthritis   ? "lower back" (10/24/2012)  ? Coronary artery disease   ? GERD (gastroesophageal reflux disease)   ? High cholesterol   ? Hypertension   ? Liver cancer ([place])   ? MDS (myelodysplastic syndrome) ([place])   ? Type II  diabetes mellitus ([place])   ? ?Past Surgical History:  ?Procedure Laterality Date  ? APPENDECTOMY  2002  ? CARDIAC CATHETERIZATION  10/24/2012  ? CORONARY ARTERY BYPASS GRAFT N/A 10/26/2012  ? Procedure: CORONARY ARTERY BYPASS GRAFTING (CABG);  Surgeon: Melrose Nakayama, MD;  Location: [place];  Service: Open Heart Surgery;  Laterality: N/A;  Times 3 using left internal mammary artery and endoscopically harvested right saphenous vein  ? ERCP N/A 04/25/2020  ? Procedure: ENDOSCOPIC RETROGRADE CHOLANGIOPANCREATOGRAPHY (ERCP);  Surgeon: Lucilla Lame, MD;  Location: [facility] ENDOSCOPY;  Service: Endoscopy;  Laterality: N/A;  ? IR BONE MARROW BIOPSY & ASPIRATION  01/26/2021  ? ?Social History:  reports that he quit smoking about 19 years ago. His smoking use included cigarettes. He has a 15.00 pack-year smoking history. He has never used smokeless tobacco. He reports that he does not drink alcohol and does not use drugs. ? ?Allergies  ?Allergen Reactions  ? Dome-Paste Bandage [Wound Dressings]   ?  All bandaids cause a rash  ? Soap & Cleansers   ?  "liquid soap" antibacterial -rash  ? Latex Rash and Itching  ? ? ?Family History  ?Problem Relation Age of Onset  ? Cancer Sister   ? ? ?Prior to Admission medications   ?Medication Sig Start Date End Date Taking? Authorizing Provider  ?acetaminophen (TYLENOL) 325 MG tablet Take 2 tablets (650 mg total) by mouth every 6 (six) hours as needed for headache or mild pain. 03/17/21   Arrien, Jimmy Picket, MD  ?  acyclovir (ZOVIRAX) 400 MG tablet TAKE 1 TABLET BY MOUTH TWICE A DAY ?Patient taking differently: Take 400 mg by mouth 2 (two) times daily. 03/18/21   Verlon Au, NP  ?Blood Glucose Monitoring Suppl (ONETOUCH VERIO FLEX SYSTEM) w/Device KIT 1 each by Does not apply route in the morning and at bedtime. 02/06/21   [provider]  ?fluconazole (DIFLUCAN) 100 MG tablet Take 1 tablet (100 mg total) by mouth daily. 03/31/21   Thurnell Lose, MD  ?Christianne Dolin HIGH-DOSE  QUADRIVALENT 0.7 ML SUSY Inject 1 each into the skin once. 11/04/20   [provider]  ?furosemide (LASIX) 40 MG tablet Take 1 tablet (40 mg total) by mouth daily. 03/18/21 04/17/21  Arrien, Jimmy Picket, MD  ?gabapentin (NEURONTIN) 100 MG capsule Take 300 mg by mouth 3 (three) times daily. 03/26/21   [provider]  ?lactulose (CHRONULAC) 10 GM/15ML solution Take 15 - 30 ml daily as needed for constipation 04/08/21   Borders, Kirt Boys, NP  ?Lancets (ONETOUCH DELICA PLUS [id number]) MISC Apply 1 each topically 2 (two) times daily. 02/06/21   [provider]  ?levofloxacin (LEVAQUIN) 500 MG tablet TAKE 1 TABLET (500 MG TOTAL) BY MOUTH DAILY. 03/23/21   Sindy Guadeloupe, MD  ?loratadine (CLARITIN) 10 MG tablet Take 5 mg by mouth daily as needed for allergies. Take half a tablet (78m) daily    [provider]  ?metoprolol tartrate (LOPRESSOR) 25 MG tablet Take 25 mg by mouth 2 (two) times daily.    [provider]  ?Morphine Sulfate (MORPHINE CONCENTRATE) 10 mg / 0.5 ml concentrated solution Take 20 mg by mouth every 4 (four) hours as needed for pain. 03/26/21   [provider]  ?nitroGLYCERIN (NITROSTAT) 0.3 MG SL tablet 0.3 mg every 5 (five) minutes as needed for chest pain.    [provider]  ?omeprazole (PRILOSEC) 40 MG capsule TAKE 1 CAPSULE BY MOUTH TWICE A DAY ?Patient taking differently: 40 mg daily as needed (acid reflux). 11/30/20   RSindy Guadeloupe MD  ?[facility] 1 each by Other route 2 (two) times daily. 02/06/21   [provider]  ?oxyCODONE (OXY IR/ROXICODONE) 5 MG immediate release tablet Take 1 tablet (5 mg total) by mouth every 4 (four) hours as needed for severe pain. 04/05/21   FLloyd Huger MD  ?traZODone (DESYREL) 50 MG tablet TAKE 1 TO 2 TABLETS BY MOUTH AT BEDTIME ?Patient taking differently: Take 50 mg by mouth at bedtime as needed for sleep. 01/29/21   RSindy Guadeloupe MD  ? ? ?Physical Exam: ?Vitals:  ? 04/14/21  1400 04/14/21 1430 04/14/21 1500 04/14/21 1543  ?BP: (!) 137/43 (!) 125/54 (!) 132/50 (!) 122/49  ?Pulse: (!) 110 (!) 105 (!) 110 93  ?Resp: 19 16 (!) 22 (!) 21  ?Temp:    98.2 ?F (36.8 ?C)  ?TempSrc:    Axillary  ?SpO2: 100% 100% 100% 100%  ? ?Physical Exam ?Vitals and nursing note reviewed.  ?Constitutional:   ?   Comments: Chronically ill-appearing, lethargic  ?HENT:  ?   Head: Normocephalic and atraumatic.  ?   Mouth/Throat:  ?   Mouth: Mucous membranes are moist.  ?Eyes:  ?   Comments: Pale conjunctiva  ?Cardiovascular:  ?   Rate and Rhythm: Tachycardia present.  ?Pulmonary:  ?   Effort: Tachypnea present.  ?   Breath sounds: Examination of the right-lower field reveals rales. Examination of the left-lower field reveals rales. Rales present.  ?  Abdominal:  ?   General: Bowel sounds are normal.  ?   Palpations: Abdomen is soft.  ?Musculoskeletal:  ?   Cervical back: Normal range of motion and neck supple.  ?   Right lower leg: Edema present.  ?   Left lower leg: Edema present.  ?Skin: ?   General: Skin is warm and dry.  ?Neurological:  ?   Motor: Weakness present.  ?Psychiatric:     ?   Mood and Affect: Mood normal.     ?   Behavior: Behavior normal.  ? ? ?Data Reviewed: ?Relevant notes from primary care and specialist visits, past discharge summaries as available in EHR, including Care Everywhere. ?Prior diagnostic testing as pertinent to current admission diagnoses ?Updated medications and problem lists for reconciliation ?ED course, including vitals, labs, imaging, treatment and response to treatment ?Triage notes, nursing and pharmacy notes and ED provider's notes ?Notable results as noted in HPI ?Labs reviewed.  Sodium 128, potassium 4.5, chloride 95, bicarb 11, glucose 461, BUN 25, creatinine 1.27, calcium 8.6, albumin 2.9, AST 36, ALT 20, alkaline phosphatase 198, total bilirubin 2.5, troponin 1783, white count 1.7, hemoglobin 4.0, hematocrit 12.4, MCV 95.4, platelet count less than 5 ?Chest x-ray reviewed  by me shows no evidence of acute cardiopulmonary disease ?Twelve-lead EKG reviewed by me shows sinus tachycardia with PVCs as well as evidence of global ischemia ?There are no new results to review at this time

## 2021-04-14 NOTE — ED Notes (Signed)
Hemoglobin 4.0 ?Hematocrit 12.4 ?Platelets less than 5 ? ?Quentin Cornwall, MD at bedside ?

## 2021-04-14 NOTE — ED Triage Notes (Signed)
Pt comes ems from PCP with shob. Pt has MDS and recent issues with neutrophil levels. Pt appears green/yellow and in distress. Denies pain at this time but is working to breathe. 2L [place] chronically. HR 118. Hx of chf as well.  ?

## 2021-04-14 NOTE — ED Notes (Signed)
Levada Dy, RN and this RN attempted for 2nd line with no success at this time. IV team consult placed. ?

## 2021-04-14 NOTE — Progress Notes (Signed)
Inpatient Diabetes Program Recommendations ? ?AACE/ADA: New Consensus Statement on Inpatient Glycemic Control (2015) ? ?Target Ranges:  Prepandial:   less than 140 mg/dL ?     Peak postprandial:   less than 180 mg/dL (1-2 hours) ?     Critically ill patients:  140 - 180 mg/dL  ? ? Latest Reference Range & Units 04/14/21 10:37  ?Sodium 135 - 145 mmol/L 128 (L)  ?Potassium 3.5 - 5.1 mmol/L 4.5  ?Chloride 98 - 111 mmol/L 95 (L)  ?CO2 22 - 32 mmol/L 11 (L)  ?Glucose 70 - 99 mg/dL 461 (H)  ?BUN 8 - 23 mg/dL 25 (H)  ?Creatinine 0.61 - 1.24 mg/dL 1.27 (H)  ?Calcium 8.9 - 10.3 mg/dL 8.6 (L)  ?Anion gap 5 - 15  22 (H)  ? ? Latest Reference Range & Units 04/14/21 13:40  ?Glucose-Capillary 70 - 99 mg/dL 393 (H) ? ?IV Insulin Drip Started  ?(H): Data is abnormally high ? ?Admit with: SOB/ Acute on Chronic Resp Failure/ DKA/ Pancytopenia ? ?History: DM, Liver cirrhosis and hepatocellular carcinoma, Myelodysplastic syndrome  ? ?Home DM Meds: ? ?Current Orders: IV Insulin Drip ? ? ?Admitted 3/26 thru 3/28 for severe pancytopenia including severe anemia and thrombocytopenia  ?Discharged home with Hospice 3/28 ?Amaryl discontinued--Was getting Amaryl 1 mg daily ? ?Please keep on the IV Insulin Drip until Anion Gap 12 or less and CO2 level 20 or higher ? ?Will follow and assist ? ? ? ? ?--Will follow patient during hospitalization-- ? ?Wyn Quaker RN, MSN, CDE ?Diabetes Coordinator ?Inpatient Glycemic Control Team ?Team Pager: [phone] (8a-5p) ? ? ? ? ?

## 2021-04-14 NOTE — ED Notes (Signed)
Agbata, MD at bedside. ?

## 2021-04-14 NOTE — Consult Note (Signed)
?[facility] CONSULT NOTE  ? ?    ?Patient ID: ?Fedrick Darrow ?MRN: [id number] ?DOB/AGE: 1945-09-29 76 y.o. ? ?Admit date: 04/14/2021 ?Referring Physician Dr. Francine Graven ?Primary Physician  ?Primary Cardiologist Dr. Neoma Laming ?Reason for Consultation elevated troponin ? ?HPI: The patient is a 76 year old male with past medical history of myelodysplastic syndrome with chronic pancytopenia, CAD (S/P CABG 2014), Thoracic aortic ulceration (S/P endovascular repair 12/2017),  cirrhosis of the liver, diabetes mellitus type 2, hypertension, hepatocellular carcinoma (S/P proton beam radiation 06/2019), gastroesophageal reflux disease, hyperlipidemia, currently enrolled in hospice who presented to [facility] ED with worsening SOB after he ran out of his lasix several days ago. Cardiology is consulted because of his elevated troponin in the setting of severe anemia and has a Hgb of 4.  ? ?The patient presents with his wife who is the primary historian as the patient is asleep, snoring upon my arrival into the room.  She says since the patient was discharged on 03/31/2021 after a 2-day hospitalization he has been increasingly sleepy, not eating well and not able to walk very much due to weakness.  His wife states he has been urinating with increasing frequency every hour.  He also ran out of Lasix 4 days ago.  He has been receiving hospice at home but his wife has an appointment set up for a second opinion at [facility] in [place] next Friday.  The patient presented to the ED today with reports of worsening shortness of breath and pain in multiple places.  His wife says the patient was having chest pain however the patient is awakened by the automatic blood pressure cuff and has severe pain in his right arm and he says in his back and is requesting more morphine.  He says he wants to be "comfortable" and "not in pain" and falls asleep once again. ? ?He had an echocardiogram 03/14/2021 during one of his  recent hospitalizations that showed preserved EF greater than 75% without significant valvular pathologies or wall motion abnormalities. ? ?Labs are notable for a sodium of 128, blood glucose 461, creatinine 1.27, GFR 59.  Initial anion gap 22.  BNP elevated to 298 and high-sensitivity troponin trending 1783-1889.  WBCs 1.7, RBCs 1.3, hemoglobin of 4, hematocrit 12.4, platelets less than 5.  Beta hydroxybutyric acid elevated at 0.40. ? ?Review of systems complete and found to be negative unless listed above  ? ?Past Medical History:  ?Diagnosis Date  ? Arthritis   ? "lower back" (10/24/2012)  ? Coronary artery disease   ? GERD (gastroesophageal reflux disease)   ? High cholesterol   ? Hypertension   ? Liver cancer ([place])   ? MDS (myelodysplastic syndrome) ([place])   ? Type II diabetes mellitus ([place])   ?  ?Past Surgical History:  ?Procedure Laterality Date  ? APPENDECTOMY  2002  ? CARDIAC CATHETERIZATION  10/24/2012  ? CORONARY ARTERY BYPASS GRAFT N/A 10/26/2012  ? Procedure: CORONARY ARTERY BYPASS GRAFTING (CABG);  Surgeon: Melrose Nakayama, MD;  Location: [facility];  Service: Open Heart Surgery;  Laterality: N/A;  Times 3 using left internal mammary artery and endoscopically harvested right saphenous vein  ? ERCP N/A 04/25/2020  ? Procedure: ENDOSCOPIC RETROGRADE CHOLANGIOPANCREATOGRAPHY (ERCP);  Surgeon: Lucilla Lame, MD;  Location: [facility] ENDOSCOPY;  Service: Endoscopy;  Laterality: N/A;  ? IR BONE MARROW BIOPSY & ASPIRATION  01/26/2021  ?  ?(Not in a hospital admission) ? ?Social History  ? ?Socioeconomic History  ? Marital status: Married  ?  Spouse name: Not on file  ? Number of children: Not on file  ? Years of education: Not on file  ? Highest education level: Not on file  ?Occupational History  ? Not on file  ?Tobacco Use  ? Smoking status: Former  ?  Packs/day: 0.50  ?  Years: 30.00  ?  Pack years: 15.00  ?  Types: Cigarettes  ?  Quit date: 08/28/2001  ?  Years since quitting: 19.6  ? Smokeless tobacco: Never   ?Vaping Use  ? Vaping Use: Never used  ?Substance and Sexual Activity  ? Alcohol use: No  ?  Alcohol/week: 0.0 standard drinks  ?  Comment: 10/24/2012 "stopped drinking alcohol in 2003; used to drink ~ 9 bottles beer/wk"  ? Drug use: No  ? Sexual activity: Yes  ?Other Topics Concern  ? Not on file  ?Social History Narrative  ? Not on file  ? ?Social Determinants of Health  ? ?Financial Resource Strain: Not on file  ?Food Insecurity: Not on file  ?Transportation Needs: Not on file  ?Physical Activity: Not on file  ?Stress: Not on file  ?Social Connections: Not on file  ?Intimate Partner Violence: Not on file  ?  ?Family History  ?Problem Relation Age of Onset  ? Cancer Sister   ?  ? ? ?Review of systems complete and found to be negative unless listed above  ? ? ?PHYSICAL EXAM ?General: Pale and chronically ill appearing Asian male, in no acute distress.  Laying in ED stretcher with mouth open, snoring. ?HEENT:  Normocephalic and atraumatic. ?Neck:  No JVD.  ?Lungs: Normal respiratory effort on oxygen by nasal cannula. Clear bilaterally to auscultation anteriorly. No wheezes, crackles, rhonchi.  ?Heart: HRRR . Normal S1 and S2 without gallops or murmurs. Radial & DP pulses 2+ bilaterally. ?Abdomen: Non-distended appearing.  ?Msk: Normal strength and tone for age. ?Extremities: Warm and well perfused. No clubbing, cyanosis.  1+ bilateral lower extremity edema.  ?Neuro: Sleeping, but arousable with blood pressure cuff inflation. ?Psych: Dysphoric mood.  Answers questions appropriately.  ? ?Labs: ?  ?Lab Results  ?Component Value Date  ? WBC 1.7 (L) 04/14/2021  ? HGB 4.0 (LL) 04/14/2021  ? HCT 12.4 (LL) 04/14/2021  ? MCV 95.4 04/14/2021  ? PLT <5 (LL) 04/14/2021  ?  ?Recent Labs  ?Lab 04/14/21 ?1037  ?NA 128*  ?K 4.5  ?CL 95*  ?CO2 11*  ?BUN 25*  ?CREATININE 1.27*  ?CALCIUM 8.6*  ?PROT 6.5  ?BILITOT 2.5*  ?ALKPHOS 198*  ?ALT 20  ?AST 36  ?GLUCOSE 461*  ? ?Lab Results  ?Component Value Date  ? CKTOTAL 159 07/01/2011  ?  CKMB 1.0 07/01/2011  ? TROPONINI < 0.02 07/02/2011  ? No results found for: CHOL ?No results found for: HDL ?No results found for: [place] ?No results found for: TRIG ?No results found for: CHOLHDL ?No results found for: LDLDIRECT  ?  ?Radiology: DG Chest Portable 1 View ? ?Result Date: 04/14/2021 ?CLINICAL DATA:  Difficulty breathing EXAM: PORTABLE CHEST 1 VIEW COMPARISON:  Previous studies including the examination of 03/29/2021 FINDINGS: Transverse diameter of heart is within normal limits. There is evidence of previous endovascular stent repair in thoracic aorta. There is evidence of previous coronary bypass surgery. There are no signs of alveolar pulmonary edema or focal pulmonary consolidation. There is no pleural effusion or pneumothorax. IMPRESSION: There are no signs of pulmonary edema or focal pulmonary consolidation. Electronically Signed   By: Prudy Feeler.D.  On: 04/14/2021 10:52  ? ?DG Chest Port 1 View ? ?Result Date: 03/29/2021 ?CLINICAL DATA:  Fever, weakness EXAM: PORTABLE CHEST 1 VIEW COMPARISON:  03/13/2021 FINDINGS: Single frontal view of the chest demonstrates stable postsurgical changes from CABG. Endoluminal stent graft of the distal aortic arch and descending thoracic aorta unchanged. Cardiac silhouette is stable. No acute airspace disease, effusion, or pneumothorax. Lung volumes are diminished with crowding of the central vasculature. No acute bony abnormalities. IMPRESSION: 1. Mild vascular congestion.  No acute airspace disease. Electronically Signed   By: Randa Ngo M.D.   On: 03/29/2021 21:31   ? ?ECHO 03/14/2021 ? 1. Left ventricular ejection fraction, by estimation, is >75%. The left  ?ventricle has hyperdynamic function. The left ventricle has no regional  ?wall motion abnormalities. Left ventricular diastolic parameters were  ?normal.  ? 2. Right ventricular systolic function is normal. The right ventricular  ?size is normal. There is normal pulmonary artery systolic  pressure.  ? 3. The mitral valve is normal in structure. No evidence of mitral valve  ?regurgitation.  ? 4. The aortic valve is tricuspid. Aortic valve regurgitation is trivial.  ?Aortic valve sclerosis is present,

## 2021-04-14 NOTE — Assessment & Plan Note (Addendum)
Most likely secondary to demand ischemia from severe anemia in a patient with a known history of coronary artery disease status post CABG ?We will cycle cardiac enzymes ?Continue metoprolol ?Unable to administer aspirin or anticoagulation due to severe anemia and thrombocytopenia ?We will consult cardiology ?

## 2021-04-14 NOTE — Assessment & Plan Note (Signed)
Patient has a known history of coronary artery disease ?Treatment as outlined in full ?

## 2021-04-14 NOTE — Assessment & Plan Note (Signed)
Treatment as outlined in 2 

## 2021-04-14 NOTE — ED Notes (Signed)
Patient taken off bi-pap by Quentin Cornwall, MD. ?

## 2021-04-14 NOTE — Progress Notes (Addendum)
Contacted by oncologist, Dr Janese Banks who recommends transfer to [facility] for further evaluation per family request. ?I have called [facility] transfer center and they have no beds available at this time. ?They are requesting we call again in the morning. ?Time spent discussing with oncology, calling the transfer center and informing patient's family was about 35 minutes. ?

## 2021-04-14 NOTE — ED Notes (Signed)
Patient repositioned in bed at this time.

## 2021-04-14 NOTE — ED Notes (Signed)
Respiratory called at this to place patient on bi-pap ?

## 2021-04-14 NOTE — ED Notes (Signed)
Agbata, MD aware of patient not having enough access for all meds ordered. IV team unable to place 3rd line at this time due to not having veins. IV to attempt 3rd line after blood infusion. Per Agbata, MD start insulin gtt and blood with access that currently available.  ?

## 2021-04-14 NOTE — Assessment & Plan Note (Addendum)
Patient noted to have severe pancytopenia related to underlying myelodysplastic syndrome. ?We will transfuse 2 units of packed RBC for hemoglobin of 4 and 1 pack of platelets for hemoglobin of less than 5 ?Chart review shows that patient was treated with Vidaza and Promacta without any significant improvement and worsening pancytopenia is concerning for disease progression. ?Hospice was recommended by patient's oncologist and is currently at home with hospice care ?We will request oncology consult during this hospitalization ?We will request palliative care consult ?

## 2021-04-14 NOTE — ED Notes (Signed)
Blood consent singed at this time. ?

## 2021-04-14 NOTE — Assessment & Plan Note (Signed)
Secondary to hyperglycemia Expect improvement in serum sodium levels following resolution of hyperglycemia 

## 2021-04-14 NOTE — Consult Note (Signed)
? ?Hematology/Oncology Consult note ?[facility] ?[phone] [phone] ? ?Patient Care Team: ?Perrin Maltese, MD as PCP - General (Internal Medicine) ?Dionisio David, MD (Cardiology) ?Sindy Guadeloupe, MD as Consulting Physician (Hematology and Oncology)  ? ?Name of the patient: Timothy Murray  ?[id number]  ?Jan 15, 1945  ? ? ?Reason for consult: History of high risk MDS now presenting with severe pancytopenia ?  ?Requesting physician: Dr. Francine Graven ? ?Date of visit:04/14/2021 ? ? ? ?History of presenting illness-   Patient is a 76 year old male with a history of [place] s/p thermal ablation as well as high risk MDS p53 positive.  He was last seen by me as an outpatient on 03/23/2021.  Initially for his MDS patient was started on Vidaza in May 2022.  She responded very well in his hemoglobin which was down to 6 had normalized to 12.  His neutropenia and thrombocytopenia had also resolved.  Sometime in December 2020.  His pancytopenia began to worsen.  He underwent a repeat bone marrow biopsy in January 2023 which showed persistent MDS but did not show any worsening blast count and there is no evidence of acute leukemia in his bone marrow back then.  He had clearly progressed on Vidaza but his predominant cytopenia at that time was thrombocytopenia.  He also saw Dr. Marvel Plan at [facility] for second opinion in the past.  Antony Blackbird was added to his regimen with hopes to improve his platelet counts despite increasing the dose of Promacta his cytopenias did not improve and his pancytopenia continue to worsen overall.  During my visit in March 2023 we discussed about pursuing hospice which the patient agreed to.  He was enrolled in home hospice but then presented a week later to [facility] with symptoms of fever and worsening pancytopenia.  At that time he requested transfer to [facility].  Apparently inpatient transfer was declined by [facility] at that time.  Patient received supportive transfusions while  athome and was discharged.  He hopes to go to [facility] for second opinion next week.  They have also initiated an outpatient referral to [facility] and he has not heard back from them yet.  I did reach out to Dr. Marvel Plan as well There were no clinical trials available at [facility].  Patient was again discharged from [facility] on home hospice.  He presents back to the ER2 weeks later with worsening exertional dyspnea but no fevers.  Patient also noted to have elevated blood sugar of 461 during his ER visit ? ? ?ECOG PS- 3 ? ?Pain scale- 0 ? ? ?Review of systems- Review of Systems  ?Constitutional:  Positive for malaise/fatigue.  ? ?Allergies  ?Allergen Reactions  ? Dome-Paste Bandage [Wound Dressings]   ?  All bandaids cause a rash  ? Soap & Cleansers   ?  "liquid soap" antibacterial -rash  ? Latex Rash and Itching  ? ? ?Patient Active Problem List  ? Diagnosis Date Noted  ? DKA (diabetic ketoacidosis) ([place]) 04/14/2021  ? Elevated troponin 04/14/2021  ? Hyponatremia 04/14/2021  ? Acute on chronic blood loss anemia 03/29/2021  ? Neutropenic fever ([place]) 03/29/2021  ? Mixed diabetic hyperlipidemia associated with type 2 diabetes mellitus ([place]) 03/29/2021  ? Lactic acidosis 03/29/2021  ? AKI (acute kidney injury) ([place]) 03/14/2021  ? Acute on chronic diastolic CHF (congestive heart failure) ([place]) 03/13/2021  ? Myelodysplastic syndrome ([place]) 05/26/2020  ? Goals of care, counseling/discussion 05/26/2020  ? Acute gallstone pancreatitis   ? Pancytopenia ([place]) 04/24/2020  ?  Acute pancreatitis 04/24/2020  ? Cirrhosis of liver on imaging, likely post proton-beam radiation ([place]) 04/24/2020  ? Calculus of bile duct without cholecystitis and without obstruction 04/16/2020  ? Snoring 02/18/2020  ? Hypotension 02/18/2020  ? Hepatocellular carcinoma ([place]) 12/26/2019  ? Colon cancer screening 04/28/2017  ? Penetrating ulcer of aorta ([place]) 02/22/2017  ? Thrombocytopenia ([place]) 12/16/2016  ? Allergic rhinitis 06/03/2016  ? Atopic dermatitis  06/03/2016  ? Bursitis of hip 01/16/2016  ? Degeneration of lumbar intervertebral disc 01/16/2016  ? Osteoarthritis 01/08/2016  ? Shortness of breath 01/02/2016  ? Calculus of kidney 01/23/2013  ? Hyperlipidemia 11/07/2012  ? Essential hypertension 10/29/2012  ? Type 2 diabetes mellitus with hyperglycemia, without long-term current use of insulin ([place]) 10/29/2012  ? Other and unspecified hyperlipidemia 10/29/2012  ? Coronary artery disease involving native coronary artery of native heart without angina pectoris 10/29/2012  ? Coronary artery disease 10/29/2012  ? Diabetes mellitus ([place]) 10/29/2012  ? Atypical chest pain 02/29/2012  ? GERD without esophagitis 10/04/2011  ? Type 2 diabetes mellitus without complications ([place]) 95/63/8756  ? ? ? ?Past Medical History:  ?Diagnosis Date  ? Arthritis   ? "lower back" (10/24/2012)  ? Coronary artery disease   ? GERD (gastroesophageal reflux disease)   ? High cholesterol   ? Hypertension   ? Liver cancer ([place])   ? MDS (myelodysplastic syndrome) ([place])   ? Type II diabetes mellitus ([place])   ? ? ? ?Past Surgical History:  ?Procedure Laterality Date  ? APPENDECTOMY  2002  ? CARDIAC CATHETERIZATION  10/24/2012  ? CORONARY ARTERY BYPASS GRAFT N/A 10/26/2012  ? Procedure: CORONARY ARTERY BYPASS GRAFTING (CABG);  Surgeon: Melrose Nakayama, MD;  Location: [facility];  Service: Open Heart Surgery;  Laterality: N/A;  Times 3 using left internal mammary artery and endoscopically harvested right saphenous vein  ? ERCP N/A 04/25/2020  ? Procedure: ENDOSCOPIC RETROGRADE CHOLANGIOPANCREATOGRAPHY (ERCP);  Surgeon: Lucilla Lame, MD;  Location: [facility] ENDOSCOPY;  Service: Endoscopy;  Laterality: N/A;  ? IR BONE MARROW BIOPSY & ASPIRATION  01/26/2021  ? ? ?Social History  ? ?Socioeconomic History  ? Marital status: Married  ?  Spouse name: Not on file  ? Number of children: Not on file  ? Years of education: Not on file  ? Highest education level: Not on file  ?Occupational History  ? Not on file   ?Tobacco Use  ? Smoking status: Former  ?  Packs/day: 0.50  ?  Years: 30.00  ?  Pack years: 15.00  ?  Types: Cigarettes  ?  Quit date: 08/28/2001  ?  Years since quitting: 19.6  ? Smokeless tobacco: Never  ?Vaping Use  ? Vaping Use: Never used  ?Substance and Sexual Activity  ? Alcohol use: No  ?  Alcohol/week: 0.0 standard drinks  ?  Comment: 10/24/2012 "stopped drinking alcohol in 2003; used to drink ~ 9 bottles beer/wk"  ? Drug use: No  ? Sexual activity: Yes  ?Other Topics Concern  ? Not on file  ?Social History Narrative  ? Not on file  ? ?Social Determinants of Health  ? ?Financial Resource Strain: Not on file  ?Food Insecurity: Not on file  ?Transportation Needs: Not on file  ?Physical Activity: Not on file  ?Stress: Not on file  ?Social Connections: Not on file  ?Intimate Partner Violence: Not on file  ? ?  ?Family History  ?Problem Relation Age of Onset  ? Cancer Sister   ? ? ? ?Current Facility-Administered Medications:  ?  0.9 %  sodium chloride infusion (Manually program via Guardrails IV Fluids), , Intravenous, Once, Merlyn Lot, MD, Held at 04/14/21 1248 ?  0.9 %  sodium chloride infusion, 10 mL/hr, Intravenous, Once, Merlyn Lot, MD, Held at 04/14/21 1248 ?  acetaminophen (TYLENOL) tablet 650 mg, 650 mg, Oral, Q6H PRN, Agbata, Tochukwu, MD ?  dextrose 5 % in lactated ringers infusion, , Intravenous, Continuous, Agbata, Tochukwu, MD, Held at 04/14/21 1249 ?  dextrose 50 % solution 0-50 mL, 0-50 mL, Intravenous, PRN, Agbata, Tochukwu, MD ?  gabapentin (NEURONTIN) capsule 300 mg, 300 mg, Oral, TID, Agbata, Tochukwu, MD, 300 mg at 04/14/21 1552 ?  insulin regular, human (MYXREDLIN) 100 units/ 100 mL infusion, , Intravenous, Continuous, Merlyn Lot, MD, Last Rate: 7 mL/hr at 04/14/21 1552, 7 Units/hr at 04/14/21 1552 ?  lactulose (CHRONULAC) 10 GM/15ML solution 30 g, 30 g, Oral, Daily PRN, Agbata, Tochukwu, MD ?  metoprolol tartrate (LOPRESSOR) tablet 25 mg, 25 mg, Oral, BID, Agbata,  Tochukwu, MD, 25 mg at 04/14/21 1506 ?  morphine (PF) 4 MG/ML injection 4 mg, 4 mg, Intravenous, Q3H PRN, Merlyn Lot, MD, 4 mg at 04/14/21 1113 ?  nitroGLYCERIN (NITROSTAT) SL tablet 0.4 mg, 0.4 mg, Subl

## 2021-04-14 NOTE — Assessment & Plan Note (Signed)
Patient noted to have DKA with an anion gap of 22 and serum bicarbonate level of 11 ?We will place patient on insulin drip ?Hold off on IV fluid resuscitation due to increased risk of developing fluid overload since he is also being transfused with blood products ?Check serial electrolytes and supplement ?

## 2021-04-14 NOTE — ED Notes (Signed)
Patient requesting oxycodone for pain like he normally takes at home. Patient states dilaudid and morphine makes him "feel funny." Message sent with same to Agbata, MD. ?

## 2021-04-14 NOTE — ED Notes (Signed)
Troponin of 1783 ? ?Quentin Cornwall, MD aware ?

## 2021-04-14 NOTE — ED Notes (Addendum)
Patient placed on male purewick at this time. Bed changed and new brief applied. ?

## 2021-04-15 ENCOUNTER — Telehealth: Payer: Self-pay | Admitting: Hospice and Palliative Medicine

## 2021-04-15 ENCOUNTER — Encounter: Payer: Self-pay | Admitting: Oncology

## 2021-04-15 DIAGNOSIS — Z7401 Bed confinement status: Secondary | ICD-10-CM | POA: Diagnosis not present

## 2021-04-15 DIAGNOSIS — J9621 Acute and chronic respiratory failure with hypoxia: Secondary | ICD-10-CM

## 2021-04-15 DIAGNOSIS — I2510 Atherosclerotic heart disease of native coronary artery without angina pectoris: Secondary | ICD-10-CM | POA: Diagnosis not present

## 2021-04-15 DIAGNOSIS — Z515 Encounter for palliative care: Secondary | ICD-10-CM

## 2021-04-15 DIAGNOSIS — E1310 Other specified diabetes mellitus with ketoacidosis without coma: Secondary | ICD-10-CM | POA: Diagnosis not present

## 2021-04-15 DIAGNOSIS — E1110 Type 2 diabetes mellitus with ketoacidosis without coma: Secondary | ICD-10-CM | POA: Diagnosis not present

## 2021-04-15 DIAGNOSIS — D469 Myelodysplastic syndrome, unspecified: Secondary | ICD-10-CM | POA: Diagnosis not present

## 2021-04-15 DIAGNOSIS — I959 Hypotension, unspecified: Secondary | ICD-10-CM | POA: Diagnosis not present

## 2021-04-15 LAB — CBC
HCT: 14 % — ABNORMAL LOW (ref 39.0–52.0)
Hemoglobin: 5 g/dL — CL (ref 13.0–17.0)
MCH: 30.9 pg (ref 26.0–34.0)
MCHC: 35.7 g/dL (ref 30.0–36.0)
MCV: 86.4 fL (ref 80.0–100.0)
Platelets: 18 10*3/uL — CL (ref 150–400)
RBC: 1.62 MIL/uL — ABNORMAL LOW (ref 4.22–5.81)
RDW: 13.8 % (ref 11.5–15.5)
WBC: 0.7 10*3/uL — CL (ref 4.0–10.5)
nRBC: 0 % (ref 0.0–0.2)

## 2021-04-15 LAB — BASIC METABOLIC PANEL
Anion gap: 5 (ref 5–15)
Anion gap: 6 (ref 5–15)
BUN: 28 mg/dL — ABNORMAL HIGH (ref 8–23)
BUN: 29 mg/dL — ABNORMAL HIGH (ref 8–23)
CO2: 26 mmol/L (ref 22–32)
CO2: 26 mmol/L (ref 22–32)
Calcium: 8.1 mg/dL — ABNORMAL LOW (ref 8.9–10.3)
Calcium: 8.2 mg/dL — ABNORMAL LOW (ref 8.9–10.3)
Chloride: 101 mmol/L (ref 98–111)
Chloride: 102 mmol/L (ref 98–111)
Creatinine, Ser: 0.94 mg/dL (ref 0.61–1.24)
Creatinine, Ser: 0.97 mg/dL (ref 0.61–1.24)
GFR, Estimated: >60 mL/min (ref >60)
GFR, Estimated: >60 mL/min (ref >60)
Glucose, Bld: 150 mg/dL — ABNORMAL HIGH (ref 70–99)
Glucose, Bld: 156 mg/dL — ABNORMAL HIGH (ref 70–99)
Potassium: 4 mmol/L (ref 3.5–5.1)
Potassium: 4.2 mmol/L (ref 3.5–5.1)
Sodium: 133 mmol/L — ABNORMAL LOW (ref 135–145)
Sodium: 133 mmol/L — ABNORMAL LOW (ref 135–145)

## 2021-04-15 LAB — CBG MONITORING, ED
Glucose-Capillary: 123 mg/dL — ABNORMAL HIGH (ref 70–99)
Glucose-Capillary: 135 mg/dL — ABNORMAL HIGH (ref 70–99)
Glucose-Capillary: 137 mg/dL — ABNORMAL HIGH (ref 70–99)
Glucose-Capillary: 140 mg/dL — ABNORMAL HIGH (ref 70–99)
Glucose-Capillary: 144 mg/dL — ABNORMAL HIGH (ref 70–99)
Glucose-Capillary: 144 mg/dL — ABNORMAL HIGH (ref 70–99)

## 2021-04-15 LAB — BPAM PLATELET PHERESIS
Blood Product Expiration Date: 202304132359
ISSUE DATE / TIME: 202304111601
Unit Type and Rh: 6200

## 2021-04-15 LAB — PREPARE PLATELET PHERESIS: Unit division: 0

## 2021-04-15 LAB — BETA-HYDROXYBUTYRIC ACID: Beta-Hydroxybutyric Acid: 0.12 mmol/L (ref 0.05–0.27)

## 2021-04-15 MED ORDER — SODIUM CHLORIDE 0.9% IV SOLUTION
Freq: Once | INTRAVENOUS | Status: AC
  Filled 2021-04-15: qty 250

## 2021-04-15 NOTE — TOC Initial Note (Signed)
Transition of Care (TOC) - Initial/Assessment Note  ? ? ?Patient Details  ?Name: Timothy Murray ?MRN: [id number] ?Date of Birth: Mar 01, 1945 ? ?Transition of Care (TOC) CM/SW Contact:    ?Shelbie Hutching, RN ?Phone Number: ?04/15/2021, 9:47 AM ? ?Clinical Narrative:                 ?Patient has been cleared for discharge home.  Patient is open with [facility].  Glennis Brink with [facility] has been notified of discharge today.  Patient lives at home with his wife, he has chronic oxygen at 2L will be going home on 4L.  ED secretary will arrange EMS transport.   ? ? ?Expected Discharge Plan: [place] ?Barriers to Discharge: Barriers Resolved ? ? ?Patient Goals and CMS Choice ?Patient states their goals for this hospitalization and ongoing recovery are:: Patient ready to get back home ?  ?  ? ?Expected Discharge Plan and Services ?Expected Discharge Plan: [place] ?  ?Discharge Planning Services: CM Consult ?Post Acute Care Choice: Resumption of Svcs/PTA Provider, Hospice ?Living arrangements for the past 2 months: [place] ?Expected Discharge Date: 04/15/21               ?DME Arranged: N/A ?DME Agency: NA ?  ?  ?  ?HH Arranged: NA ?Welcome Agency: NA ?  ?  ?  ? ?Prior Living Arrangements/Services ?Living arrangements for the past 2 months: [place] ?Lives with:: Spouse ?Patient language and need for interpreter reviewed:: Yes ?Do you feel safe going back to the place where you live?: Yes      ?Need for Family Participation in Patient Care: Yes (Comment) ?Care giver support system in place?: Yes (comment) ?Current home services: DME (oxygen) ?Criminal Activity/Legal Involvement Pertinent to Current Situation/Hospitalization: No - Comment as needed ? ?Activities of Daily Living ?  ?  ? ?Permission Sought/Granted ?Permission sought to share information with : Case Manager, Family Supports, Other (comment) ?Permission granted to share information with : Yes, Verbal  Permission Granted ? Share Information with NAME: Timothy Murray ? Permission granted to share info w AGENCY: [facility] ? Permission granted to share info w Relationship: spouse ? Permission granted to share info w Contact Information: [phone] ? ?Emotional Assessment ?Appearance:: Appears stated age ?Attitude/Demeanor/Rapport: Engaged ?Affect (typically observed): Accepting ?Orientation: : Oriented to Self, Oriented to Place, Oriented to  Time, Oriented to Situation ?Alcohol / Substance Use: Not Applicable ?Psych Involvement: No (comment) ? ?Admission diagnosis:  DKA (diabetic ketoacidosis) ([place]) [E11.10] ?Patient Active Problem List  ? Diagnosis Date Noted  ? Acute on chronic respiratory failure with hypoxia (HCC)   ? DKA (diabetic ketoacidosis) ([place]) 04/14/2021  ? Elevated troponin 04/14/2021  ? Hyponatremia 04/14/2021  ? Acute on chronic blood loss anemia 03/29/2021  ? Neutropenic fever ([place]) 03/29/2021  ? Mixed diabetic hyperlipidemia associated with type 2 diabetes mellitus ([place]) 03/29/2021  ? Lactic acidosis 03/29/2021  ? AKI (acute kidney injury) ([place]) 03/14/2021  ? Acute on chronic diastolic CHF (congestive heart failure) ([place]) 03/13/2021  ? Myelodysplastic syndrome ([place]) 05/26/2020  ? Hospice care 05/26/2020  ? Acute gallstone pancreatitis   ? Pancytopenia ([place]) 04/24/2020  ? Acute pancreatitis 04/24/2020  ? Cirrhosis of liver on imaging, likely post proton-beam radiation ([place]) 04/24/2020  ? Calculus of bile duct without cholecystitis and without obstruction 04/16/2020  ? Snoring 02/18/2020  ? Hypotension 02/18/2020  ? Hepatocellular carcinoma ([place]) 12/26/2019  ? Colon cancer screening 04/28/2017  ? Penetrating ulcer  of aorta ([place]) 02/22/2017  ? Thrombocytopenia ([place]) 12/16/2016  ? Allergic rhinitis 06/03/2016  ? Atopic dermatitis 06/03/2016  ? Bursitis of hip 01/16/2016  ? Degeneration of lumbar intervertebral disc 01/16/2016  ? Osteoarthritis 01/08/2016  ? Shortness of breath 01/02/2016  ?  Calculus of kidney 01/23/2013  ? Hyperlipidemia 11/07/2012  ? Essential hypertension 10/29/2012  ? Type 2 diabetes mellitus with hyperglycemia, without long-term current use of insulin ([place]) 10/29/2012  ? Other and unspecified hyperlipidemia 10/29/2012  ? Coronary artery disease involving native coronary artery of native heart without angina pectoris 10/29/2012  ? Coronary artery disease 10/29/2012  ? Diabetes mellitus ([place]) 10/29/2012  ? Atypical chest pain 02/29/2012  ? GERD without esophagitis 10/04/2011  ? Type 2 diabetes mellitus without complications ([place]) 87/68/1157  ? ?PCP:  Perrin Maltese, MD ?Pharmacy:   ?[id number] [address]?Phone: [phone]: [phone]? ?[facility]?Phone: [phone]: [phone] ? ? ? ? ?Social Determinants of Health (SDOH) Interventions ?  ? ?Readmission Risk Interventions ?   ? View : No data to display.  ?  ?  ?  ? ? ? ?

## 2021-04-15 NOTE — Care Management Obs Status (Signed)
MEDICARE OBSERVATION STATUS NOTIFICATION ? ? ?Patient Details  ?Name: Timothy Murray ?MRN: [id number] ?Date of Birth: Jan 21, 1945 ? ? ?Medicare Observation Status Notification Given:  Yes ? ? ? ?Shelbie Hutching, RN ?04/15/2021, 9:13 AM ?

## 2021-04-15 NOTE — Telephone Encounter (Signed)
I called and spoke with patient's daughter and then patient's wife by phone to clarify goals.  We discussed option for referral to [facility].  Wife states that patient has "given up" and does not intend to seek further hospitalization or treatment and instead he wants to stay home and remain comfortable until his end-of-life.  Patient is currently followed by [facility] hospice. ?

## 2021-04-15 NOTE — Care Management CC44 (Signed)
Condition Code 44 Documentation Completed ? ?Patient Details  ?Name: Timothy Murray ?MRN: [id number] ?Date of Birth: Feb 16, 1945 ? ? ?Condition Code 44 given:  Yes ?Patient signature on Condition Code 44 notice:  Yes ?Documentation of 2 MD's agreement:  Yes ?Code 44 added to claim:  Yes ? ? ? ?Shelbie Hutching, RN ?04/15/2021, 9:13 AM ? ?

## 2021-04-15 NOTE — ED Notes (Signed)
AVS provided to and discussed with patient and transport team. Pt verbalizes understanding of discharge instructions and denies any questions or concerns at this time. Pt to be transported home on hospice care.  ?

## 2021-04-15 NOTE — ED Notes (Signed)
Called [facility] to transport pt home ?

## 2021-04-16 ENCOUNTER — Other Ambulatory Visit (HOSPITAL_COMMUNITY): Payer: Self-pay

## 2021-04-16 LAB — TYPE AND SCREEN
ABO/RH(D): A POS
Antibody Screen: NEGATIVE
Unit division: 0
Unit division: 0
Unit division: 0
Unit division: 0
Unit division: 0

## 2021-04-16 LAB — BPAM RBC
Blood Product Expiration Date: 202304162359
Blood Product Expiration Date: 202304252359
Blood Product Expiration Date: 202304302359
Blood Product Expiration Date: 202305032359
Blood Product Expiration Date: 202305032359
ISSUE DATE / TIME: 202304111244
ISSUE DATE / TIME: 202304111820
ISSUE DATE / TIME: 202304111822
ISSUE DATE / TIME: 202304120603
Unit Type and Rh: 5100
Unit Type and Rh: 600
Unit Type and Rh: 600
Unit Type and Rh: 6200
Unit Type and Rh: 9500

## 2021-04-16 LAB — BPAM PLATELET PHERESIS
Blood Product Expiration Date: 202304132359
ISSUE DATE / TIME: 202304120304
Unit Type and Rh: 5100

## 2021-04-16 LAB — PREPARE PLATELET PHERESIS: Unit division: 0

## 2021-04-16 LAB — PREPARE RBC (CROSSMATCH)

## 2021-04-17 ENCOUNTER — Telehealth: Payer: Self-pay

## 2021-04-17 NOTE — Telephone Encounter (Signed)
Error

## 2021-04-18 DIAGNOSIS — M5416 Radiculopathy, lumbar region: Secondary | ICD-10-CM | POA: Diagnosis not present

## 2021-04-21 ENCOUNTER — Telehealth: Payer: Self-pay

## 2021-04-21 NOTE — Telephone Encounter (Signed)
Reached out to [facility] dept and spoke to Amy. She informed me that she has sent pts records for review to MD multiple times and has not heard back. Actually, resent chart yesterday for review and is hoping to hear back today or tomorrow and she will call pt and let them know.  ?

## 2021-04-23 ENCOUNTER — Telehealth: Payer: Self-pay

## 2021-04-23 NOTE — Telephone Encounter (Signed)
I spoke with Amber from [facility] blood [place] and she states that her coworker Amy is still currently working on his referral and recently took the referral to her supervisor since she has not had a response  from MD yet.   ?

## 2021-04-24 ENCOUNTER — Encounter: Payer: Self-pay | Admitting: Oncology

## 2021-04-25 ENCOUNTER — Other Ambulatory Visit: Payer: Self-pay

## 2021-04-25 ENCOUNTER — Inpatient Hospital Stay
Admission: EM | Admit: 2021-04-25 | Discharge: 2021-05-04 | DRG: 951 | Disposition: E | Attending: Internal Medicine | Admitting: Internal Medicine

## 2021-04-25 ENCOUNTER — Encounter: Payer: Self-pay | Admitting: Internal Medicine

## 2021-04-25 DIAGNOSIS — Z6824 Body mass index (BMI) 24.0-24.9, adult: Secondary | ICD-10-CM

## 2021-04-25 DIAGNOSIS — Z8505 Personal history of malignant neoplasm of liver: Secondary | ICD-10-CM

## 2021-04-25 DIAGNOSIS — I10 Essential (primary) hypertension: Secondary | ICD-10-CM | POA: Diagnosis present

## 2021-04-25 DIAGNOSIS — R451 Restlessness and agitation: Secondary | ICD-10-CM | POA: Diagnosis present

## 2021-04-25 DIAGNOSIS — Z91048 Other nonmedicinal substance allergy status: Secondary | ICD-10-CM

## 2021-04-25 DIAGNOSIS — E7800 Pure hypercholesterolemia, unspecified: Secondary | ICD-10-CM | POA: Diagnosis present

## 2021-04-25 DIAGNOSIS — M51369 Other intervertebral disc degeneration, lumbar region without mention of lumbar back pain or lower extremity pain: Secondary | ICD-10-CM | POA: Diagnosis present

## 2021-04-25 DIAGNOSIS — Z66 Do not resuscitate: Secondary | ICD-10-CM | POA: Diagnosis present

## 2021-04-25 DIAGNOSIS — J9621 Acute and chronic respiratory failure with hypoxia: Secondary | ICD-10-CM | POA: Diagnosis present

## 2021-04-25 DIAGNOSIS — E785 Hyperlipidemia, unspecified: Secondary | ICD-10-CM | POA: Diagnosis present

## 2021-04-25 DIAGNOSIS — Z743 Need for continuous supervision: Secondary | ICD-10-CM | POA: Diagnosis not present

## 2021-04-25 DIAGNOSIS — M5136 Other intervertebral disc degeneration, lumbar region: Secondary | ICD-10-CM | POA: Diagnosis present

## 2021-04-25 DIAGNOSIS — R410 Disorientation, unspecified: Secondary | ICD-10-CM | POA: Diagnosis present

## 2021-04-25 DIAGNOSIS — Z91040 Latex allergy status: Secondary | ICD-10-CM | POA: Diagnosis not present

## 2021-04-25 DIAGNOSIS — D469 Myelodysplastic syndrome, unspecified: Secondary | ICD-10-CM | POA: Diagnosis present

## 2021-04-25 DIAGNOSIS — R54 Age-related physical debility: Secondary | ICD-10-CM | POA: Diagnosis present

## 2021-04-25 DIAGNOSIS — T66XXXD Radiation sickness, unspecified, subsequent encounter: Secondary | ICD-10-CM | POA: Diagnosis not present

## 2021-04-25 DIAGNOSIS — Z87891 Personal history of nicotine dependence: Secondary | ICD-10-CM

## 2021-04-25 DIAGNOSIS — C220 Liver cell carcinoma: Secondary | ICD-10-CM | POA: Diagnosis present

## 2021-04-25 DIAGNOSIS — R64 Cachexia: Secondary | ICD-10-CM | POA: Diagnosis present

## 2021-04-25 DIAGNOSIS — K219 Gastro-esophageal reflux disease without esophagitis: Secondary | ICD-10-CM | POA: Diagnosis present

## 2021-04-25 DIAGNOSIS — R739 Hyperglycemia, unspecified: Secondary | ICD-10-CM | POA: Diagnosis not present

## 2021-04-25 DIAGNOSIS — I959 Hypotension, unspecified: Secondary | ICD-10-CM | POA: Diagnosis not present

## 2021-04-25 DIAGNOSIS — Z951 Presence of aortocoronary bypass graft: Secondary | ICD-10-CM | POA: Diagnosis not present

## 2021-04-25 DIAGNOSIS — D696 Thrombocytopenia, unspecified: Secondary | ICD-10-CM | POA: Diagnosis present

## 2021-04-25 DIAGNOSIS — Y632 Overdose of radiation given during therapy: Secondary | ICD-10-CM | POA: Diagnosis present

## 2021-04-25 DIAGNOSIS — K859 Acute pancreatitis without necrosis or infection, unspecified: Secondary | ICD-10-CM | POA: Diagnosis present

## 2021-04-25 DIAGNOSIS — K7460 Unspecified cirrhosis of liver: Secondary | ICD-10-CM | POA: Diagnosis present

## 2021-04-25 DIAGNOSIS — E1165 Type 2 diabetes mellitus with hyperglycemia: Secondary | ICD-10-CM | POA: Diagnosis present

## 2021-04-25 DIAGNOSIS — R0689 Other abnormalities of breathing: Secondary | ICD-10-CM | POA: Diagnosis not present

## 2021-04-25 DIAGNOSIS — I2510 Atherosclerotic heart disease of native coronary artery without angina pectoris: Secondary | ICD-10-CM | POA: Diagnosis present

## 2021-04-25 DIAGNOSIS — Z79899 Other long term (current) drug therapy: Secondary | ICD-10-CM

## 2021-04-25 DIAGNOSIS — Z515 Encounter for palliative care: Principal | ICD-10-CM

## 2021-04-25 DIAGNOSIS — K7469 Other cirrhosis of liver: Secondary | ICD-10-CM | POA: Diagnosis present

## 2021-04-25 MED ORDER — LORAZEPAM 1 MG PO TABS: 1.0000 mg | ORAL_TABLET | ORAL | Status: DC | PRN

## 2021-04-25 MED ORDER — LOPERAMIDE HCL 2 MG PO CAPS: 2.0000 mg | ORAL_CAPSULE | ORAL | Status: DC | PRN

## 2021-04-25 MED ORDER — POLYVINYL ALCOHOL 1.4 % OP SOLN: 1.0000 [drp] | Freq: Four times a day (QID) | OPHTHALMIC | Status: DC | PRN

## 2021-04-25 MED ORDER — LORAZEPAM 1 MG PO TABS
1.0000 mg | ORAL_TABLET | Freq: Once | ORAL | Status: AC
  Administered 2021-04-25: 1 mg via ORAL
  Filled 2021-04-25: qty 1

## 2021-04-25 MED ORDER — MORPHINE SULFATE (CONCENTRATE) 10 MG/0.5ML PO SOLN
2.0000 mg | ORAL | Status: DC | PRN
  Administered 2021-04-25: 2 mg via ORAL
  Filled 2021-04-25: qty 0.5

## 2021-04-25 MED ORDER — ACETAMINOPHEN 650 MG RE SUPP: 650.0000 mg | Freq: Four times a day (QID) | RECTAL | Status: DC | PRN

## 2021-04-25 MED ORDER — MORPHINE SULFATE (CONCENTRATE) 10 MG/0.5ML PO SOLN
10.0000 mg | ORAL | Status: DC | PRN
  Administered 2021-04-25: 10 mg via ORAL
  Filled 2021-04-25: qty 0.5

## 2021-04-25 MED ORDER — MORPHINE SULFATE (PF) 2 MG/ML IV SOLN
2.0000 mg | INTRAVENOUS | Status: DC | PRN
  Administered 2021-04-26 – 2021-04-27 (×4): 2 mg via INTRAVENOUS
  Filled 2021-04-25 (×4): qty 1

## 2021-04-25 MED ORDER — HALOPERIDOL LACTATE 5 MG/ML IJ SOLN
0.5000 mg | INTRAMUSCULAR | Status: DC | PRN
  Administered 2021-04-26: 0.5 mg via INTRAVENOUS
  Filled 2021-04-25 (×2): qty 1

## 2021-04-25 MED ORDER — TRAZODONE HCL 50 MG PO TABS: 50.0000 mg | ORAL_TABLET | Freq: Every day | ORAL | Status: DC

## 2021-04-25 MED ORDER — FENTANYL CITRATE PF 50 MCG/ML IJ SOSY
50.0000 ug | PREFILLED_SYRINGE | INTRAMUSCULAR | Status: AC | PRN
Start: 2021-04-25 — End: 2021-04-26

## 2021-04-25 MED ORDER — HALOPERIDOL 0.5 MG PO TABS
0.5000 mg | ORAL_TABLET | ORAL | Status: DC | PRN
  Administered 2021-04-27: 0.5 mg via ORAL
  Filled 2021-04-25: qty 1

## 2021-04-25 MED ORDER — GLYCOPYRROLATE 0.2 MG/ML IJ SOLN
0.2000 mg | INTRAMUSCULAR | Status: DC | PRN
Start: 2021-04-25 — End: 2021-04-27
  Filled 2021-04-25: qty 1

## 2021-04-25 MED ORDER — NYSTATIN 100000 UNIT/GM EX POWD: Freq: Three times a day (TID) | CUTANEOUS | Status: DC | PRN

## 2021-04-25 MED ORDER — ONDANSETRON 4 MG PO TBDP: 4.0000 mg | ORAL_TABLET | Freq: Four times a day (QID) | ORAL | Status: DC | PRN

## 2021-04-25 MED ORDER — MORPHINE SULFATE (CONCENTRATE) 10 MG/0.5ML PO SOLN
5.0000 mg | ORAL | Status: DC | PRN
  Administered 2021-04-26: 5 mg via SUBLINGUAL
  Filled 2021-04-25 (×2): qty 0.5

## 2021-04-25 MED ORDER — MORPHINE SULFATE (CONCENTRATE) 10 MG/0.5ML PO SOLN
5.0000 mg | ORAL | Status: DC | PRN
  Administered 2021-04-26 (×4): 5 mg via ORAL
  Filled 2021-04-25 (×3): qty 0.5

## 2021-04-25 MED ORDER — DIPHENHYDRAMINE HCL 50 MG/ML IJ SOLN: 12.5000 mg | INTRAMUSCULAR | Status: DC | PRN

## 2021-04-25 MED ORDER — GLYCOPYRROLATE 1 MG PO TABS
1.0000 mg | ORAL_TABLET | ORAL | Status: DC | PRN
  Filled 2021-04-25: qty 1

## 2021-04-25 MED ORDER — HALOPERIDOL LACTATE 2 MG/ML PO CONC
0.5000 mg | ORAL | Status: DC | PRN
  Administered 2021-04-26: 0.5 mg via SUBLINGUAL
  Filled 2021-04-25: qty 0.25
  Filled 2021-04-25 (×2): qty 0.3

## 2021-04-25 MED ORDER — BISACODYL 10 MG RE SUPP
10.0000 mg | Freq: Every day | RECTAL | Status: DC | PRN
  Filled 2021-04-25: qty 1

## 2021-04-25 MED ORDER — LORAZEPAM 2 MG/ML IJ SOLN
2.0000 mg | INTRAMUSCULAR | Status: DC | PRN
  Filled 2021-04-25: qty 1

## 2021-04-25 MED ORDER — SODIUM CHLORIDE 0.9 % IV SOLN
12.5000 mg | Freq: Four times a day (QID) | INTRAVENOUS | Status: DC | PRN
  Filled 2021-04-25: qty 0.5

## 2021-04-25 MED ORDER — ACETAMINOPHEN 325 MG PO TABS: 650.0000 mg | ORAL_TABLET | Freq: Four times a day (QID) | ORAL | Status: DC | PRN

## 2021-04-25 MED ORDER — BIOTENE DRY MOUTH MT LIQD
15.0000 mL | OROMUCOSAL | Status: DC | PRN
  Filled 2021-04-25: qty 15

## 2021-04-25 MED ORDER — FENTANYL CITRATE PF 50 MCG/ML IJ SOSY: 25.0000 ug | PREFILLED_SYRINGE | INTRAMUSCULAR | Status: DC | PRN

## 2021-04-25 MED ORDER — LORAZEPAM 2 MG/ML PO CONC
1.0000 mg | ORAL | Status: DC | PRN
  Administered 2021-04-26 (×4): 1 mg via SUBLINGUAL
  Filled 2021-04-25 (×4): qty 1

## 2021-04-25 MED ORDER — ONDANSETRON HCL 4 MG/2ML IJ SOLN: 4.0000 mg | Freq: Four times a day (QID) | INTRAMUSCULAR | Status: DC | PRN

## 2021-04-25 NOTE — ED Notes (Signed)
Request made for transport to the floor ?

## 2021-04-25 NOTE — Assessment & Plan Note (Addendum)
-   Patient presenting from home on home hospice for comfort care for end-stage cancer, family called EMS because patient's pain management was not well controlled at home  ?He is treated in hospital with comfort care. Died in peace today.  ?

## 2021-04-25 NOTE — ED Provider Notes (Signed)
Patient ? ?[facility] ?Provider Note ? ? ? Event Date/Time  ? First MD Initiated Contact with Patient 05/02/2021 1751   ?  (approximate) ? ? ?History  ? ?Chief Complaint ?Hospice pt needs palliative care consult ? ? ?HPI ? ?Timothy Murray is a 76 y.o. male with past medical history of hepatocellular carcinoma, myelodysplastic syndrome, and pancytopenia who presents to the ED for palliative care.  History is limited as patient is disoriented and English is not his first language.  Majority of history is obtained from EMS and patient's daughter.  Daughter states that patient has been receiving hospice care at home, but they do not feel this is adequately managing his symptoms.  Daughter states she has been giving 0.5 mL of liquid morphine along with 1 mg Ativan tablet crushed up and liquid about every hour.  Despite this, patient continues to be in pain with agitation and restlessness.  She is requesting that patient be given additional comfort care here in the hospital and be evaluated by palliative care.  Patient is unable to provide any additional history. ?  ? ? ?Physical Exam  ? ?Triage Vital Signs: ?ED Triage Vitals  ?Enc Vitals Group  ?   BP   ?   Pulse   ?   Resp   ?   Temp   ?   Temp src   ?   SpO2   ?   Weight   ?   Height   ?   Head Circumference   ?   Peak Flow   ?   Pain Score   ?   Pain Loc   ?   Pain Edu?   ?   Excl. in [place]?   ? ? ?Most recent vital signs: ?Vitals:  ? 05/02/2021 1810 04/15/2021 1900  ?BP: (!) 124/48 (!) 130/51  ?Pulse: (!) 112 (!) 107  ?Resp: (!) 22 19  ?Temp: 98.7 ?F (37.1 ?C)   ?SpO2: 100% 100%  ? ? ?Constitutional: Somnolent, arousable to voice with only groaning response.  Pale appearing. ?Eyes: Conjunctivae are normal. ?Head: Atraumatic. ?Nose: No congestion/rhinnorhea. ?Mouth/Throat: Mucous membranes are dry. ?Cardiovascular: Tachycardic, regular rhythm. Grossly normal heart sounds.  2+ radial pulses bilaterally. ?Respiratory: Tachypneic with normal respiratory  effort.  No retractions. Lungs CTAB. ?Gastrointestinal: Soft and nontender. No distention. ?Musculoskeletal: No lower extremity tenderness nor edema.  ?Neurologic: Groaning verbal response, minimal spontaneous movement noted in all 4 extremities. ? ? ? ?ED Results / Procedures / Treatments  ? ?Labs ?(all labs ordered are listed, but only abnormal results are displayed) ?Labs Reviewed - No data to display ? ? ?PROCEDURES: ? ?Critical Care performed: No ? ?Procedures ? ? ?MEDICATIONS ORDERED IN ED: ?Medications  ?morphine CONCENTRATE 10 MG/0.5ML oral solution 10 mg (10 mg Oral Given 04/29/2021 1948)  ?LORazepam (ATIVAN) tablet 1 mg (1 mg Oral Given 04/29/2021 1844)  ? ? ? ?IMPRESSION / MDM / ASSESSMENT AND PLAN / ED COURSE  ?I reviewed the triage vital signs and the nursing notes. ?             ?               ? ?76 y.o. male with past medical history of hepatocellular carcinoma, myelodysplastic syndrome, and pancytopenia who presents to the ED for increasing pain and agitation while receiving hospice care at home. ? ?Differential diagnosis includes, but is not limited to, inadequate pain control, agitation, advanced myelodysplastic syndrome. ? ?Patient uncomfortable appearing on arrival  to the ED, appears critically ill and is likely in the process of dying.  Patient currently receiving hospice care at home and I clarified with family that they are not interested in any escalation of care.  They are instead here in the ED seeking for additional symptom control and do not feel patient's hospice needs are being met at home.  We will give additional dose of liquid morphine along with liquid Ativan to attempt to control patient's pain and agitation.  Plan to hold off on IV placement unless absolutely needed. ? ?Patient with some improved pain control following oral morphine, however I was able to clarify dose with his hospice nurse over the phone and he has been receiving 10 mg per 0.5 mL every 1-2 hours with only partial  relief of pain.  We will give additional oral morphine, agitation improved following oral Ativan.  Given family with concerns that home hospice has not been meeting the patient's needs, case discussed with hospitalist for admission. ? ?  ? ? ?FINAL CLINICAL IMPRESSION(S) / ED DIAGNOSES  ? ?Final diagnoses:  ?Palliative care patient  ?MDS (myelodysplastic syndrome) ([place])  ? ? ? ?Rx / DC Orders  ? ?ED Discharge Orders   ? ? None  ? ?  ? ? ? ?Note:  This document was prepared using Dragon voice recognition software and may include unintentional dictation errors. ?  ?Blake Divine, MD ?04/28/2021 2002 ? ?

## 2021-04-25 NOTE — ED Notes (Signed)
Called and left message for RN on call at [facility] [phone] ?

## 2021-04-25 NOTE — Hospital Course (Addendum)
[name] is a 76 year old male with history of hepatocellular carcinoma status post proton beam ablation in June 2021, liver cirrhosis, myelodysplastic syndrome with 5 q. deletion and p53 positive, chronic thrombocytopenia presumed secondary to liver cirrhosis, history of acute biliary gallstone pancreatitis status post ERCP inpatient with platelet transfusion, status post biliary sphincterotomy, currently receiving home hospice, who presents to the emergency department for chief concerns of palliative care consult and receiving better comfort care pain management. ? ?Initial vitals in the emergency department show temperature of 98.7, respiration rate of 19, heart rate 109, blood pressure 130/51, currently SPO2 of 100% on 4 L nasal cannula. ? ?Labs from 04/15/21, showed serum sodium is 133, potassium 4.0, chloride 102, bicarb 26, BUN of 28, serum creatinine of 0.94, GFR greater than 60, nonfasting blood glucose 156 and CBC from 04/15/21, showing WBC 0.7, hemoglobin 5.0, platelets of 18. ? ?ED treatment: Ativan 1 mg p.o. one-time dose, morphine 10 mg solution and 2 mg solution. ? ?Patient was placed on comfort care after admission. He died in peace 05-21-2022 at 620 am.  ?

## 2021-04-25 NOTE — ED Notes (Signed)
IP provider at bedside.

## 2021-04-25 NOTE — ED Triage Notes (Signed)
Pt to ED from home [facility]. Pt is hospice patient, end stage cancer. Family called EMS to bring pt to hospital because they need help getting palliative care consult and better comfort care pain management than what he is currently receiving. ? ?Family also told EMS that they need help creating advance directives for patient. ? ?Pt is currently on 4L oxygen [place], respirations appear labored. Eyes are closed. ? ?EMS VS were: 108/40, HR 112, RR 24, 100% on 4L [place], CBG was 414 (not being treated for diabetes anymore) and GCS was 14. ? ?Dr Charna Archer has been at bedside and is attempting to contact family. ?

## 2021-04-25 NOTE — H&P (Addendum)
?History and Physical  ? ?Timothy Murray JHE:[id number] DOB: October 04, 1945 DOA: 04/07/2021 ? ?PCP: Perrin Maltese, MD  ?Outpatient Specialists: Dr. Janese Banks, medical oncology ?Patient coming from: Home via [facility] ? ?I have personally briefly reviewed patient's old medical records in [place]. ? ?Chief Concern: Comfort care management ? ?HPI: Timothy Murray is a 76 year old male with history of hepatocellular carcinoma status post proton beam ablation in June 2021, liver cirrhosis, myelodysplastic syndrome with 5 q. deletion and p53 positive, chronic thrombocytopenia presumed secondary to liver cirrhosis, history of acute biliary gallstone pancreatitis status post ERCP inpatient with platelet transfusion, status post biliary sphincterotomy, currently receiving home hospice, who presents to the emergency department for chief concerns of palliative care consult and receiving better comfort care pain management. ? ?Initial vitals in the emergency department show temperature of 98.7, respiration rate of 19, heart rate 109, blood pressure 130/51, currently SPO2 of 100% on 4 L nasal cannula. ? ?Labs from 04/15/21, showed serum sodium is 133, potassium 4.0, chloride 102, bicarb 26, BUN of 28, serum creatinine of 0.94, GFR greater than 60, nonfasting blood glucose 156 and CBC from 04/15/21, showing WBC 0.7, hemoglobin 5.0, platelets of 18. ? ?ED treatment: Ativan 1 mg p.o. one-time dose, morphine 10 mg solution and 2 mg solution. ? ?At bedside patient can track his wife and myself.  He does look my way when I addressed him.  He was not able to tell me his full name or his age.  He does not answer my questions with legible words.  He mumbles and recognizes his wife at bedside. ? ?Social history: He lives at home with his wife.  He is a former tobacco user quitting about 10 years ago.  He formally drank socially.  Per his wife, he never used recreational drugs.  He is from the [place] and has been to [place] for  about 20 years.  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 ? ?Vaccination history: Per his spouse he is vaccinated for COVID-19 and influenza. ? ?ROS: Unable to complete as patient is actively dying and nonverbal to me ? ?ED Course: Discussed with emergency medicine provider, patient requiring hospitalization for chief concerns of comfort care management. ? ?Assessment/Plan ? ?Principal Problem: ?  End of life care ?Active Problems: ?  Type 2 diabetes mellitus with hyperglycemia, without long-term current use of insulin ([place]) ?  Hospice care ?  Coronary artery disease involving native coronary artery of native heart without angina pectoris ?  Essential hypertension ?  GERD without esophagitis ?  Hepatocellular carcinoma ([place]) ?  Thrombocytopenia ([place]) ?  Acute pancreatitis ?  Cirrhosis of liver on imaging, likely post proton-beam radiation [facility] ?  Degeneration of lumbar intervertebral disc ?  Coronary artery disease ?  Hyperlipidemia ?  Acute on chronic respiratory failure with hypoxia (HCC) ?  ?Assessment and Plan: ? ?* End of life care ?- Patient presenting from home on home hospice for comfort care for end-stage cancer, family called EMS because patient's pain management was not well controlled at home  ?- After discussion in the emergency medicine provider, family has decided to continue with comfort measure and care only ?- Comfort care only orders placed ?- End-of-life care order set utilized ?- No antibiotics or IVF as per family's request ?- As needed medications include pain control with morphine as needed, fentanyl 50 mcg IV every 2 hours as needed for refractory pain, morphine can be transition to a drip if  needed ?- Discussed extensively with spouse outside of patient's room that the use of pain medications to increase his comfort may quicken patient's dying process.  Spouse states that she understands and would like to proceed further because her goal and the family's is for  patient to have reduced suffering. ?- I have explained that the process of dying can take hours to days and that we cannot predict how long the process will last.  Spouse acknowledges understanding ?- Admit to palliative care, inpatient ?- RN can pronounce death ? ?Chart reviewed.  ? ?Patient was seen and admitted on 04/14/2021 and discharged on 04/15/2021 to home hospice for comfort measures.  He presented to the emergency department for chief concerns of shortness of breath, chest pain, and pain in his lower back.  Spouse at bedside had reported that he has become increasingly lethargic and weak and requires assistance with activities of daily living.  Patient had endorsed generalized pain and states that he is tired of suffering.  During this admission, he was admitted for diabetic ketoacidosis with anion gap of 22 and bicarb of 11.  He was admitted to stepdown for insulin drip.  Aggressive fluid resuscitation was not given due to risk of developing fluid overload as he was transfused blood products.  He was transfused 2 units of packed red blood cells for hemoglobin of 4 and 1 unit of platelets.  Patient then let staff know that he would like to be go home and be comfortable.  Case manager, TOC was consulted and patient was discharged home on home hospice with EMS as transport. ? ?DVT prophylaxis: None, comfort measures only ?Code Status: DNR ?Diet: Patient may have ice and comfort feeds ?Family Communication: I discussed with spouse at bedside ?Disposition Plan: Anticipate in-hospital death ?Consults called: None at this time ?Admission status: Admit - It is my clinical opinion that admission to INPATIENT is reasonable and necessary because of the expectation that this patient will require hospital care that crosses at least 2 midnights to treat this condition based on the medical complexity of the problems presented.  Given the aforementioned information, the predictability of an adverse outcome is felt to be  significant. ? ?Past Medical History:  ?Diagnosis Date  ? Arthritis   ? "lower back" (10/24/2012)  ? Coronary artery disease   ? GERD (gastroesophageal reflux disease)   ? High cholesterol   ? Hypertension   ? Liver cancer ([place])   ? MDS (myelodysplastic syndrome) ([place])   ? Type II diabetes mellitus ([place])   ? ?Past Surgical History:  ?Procedure Laterality Date  ? APPENDECTOMY  2002  ? CARDIAC CATHETERIZATION  10/24/2012  ? CORONARY ARTERY BYPASS GRAFT N/A 10/26/2012  ? Procedure: CORONARY ARTERY BYPASS GRAFTING (CABG);  Surgeon: Melrose Nakayama, MD;  Location: [facility];  Service: Open Heart Surgery;  Laterality: N/A;  Times 3 using left internal mammary artery and endoscopically harvested right saphenous vein  ? ERCP N/A 04/25/2020  ? Procedure: ENDOSCOPIC RETROGRADE CHOLANGIOPANCREATOGRAPHY (ERCP);  Surgeon: Lucilla Lame, MD;  Location[facility] ENDOSCOPY;  Service: Endoscopy;  Laterality: N/A;  ? IR BONE MARROW BIOPSY & ASPIRATION  01/26/2021  ? ?Social History:  reports that he quit smoking about 19 years ago. His smoking use included cigarettes. He has a 15.00 pack-year smoking history. He has never used smokeless tobacco. He reports that he does not drink alcohol and does not use drugs. ? ?Allergies  ?Allergen Reactions  ? Dome-Paste Bandage [Wound Dressings]   ?  All  bandaids cause a rash  ? Soap & Cleansers   ?  "liquid soap" antibacterial -rash  ? Latex Rash and Itching  ? ?Family History  ?Problem Relation Age of Onset  ? Cancer Sister   ? ?Family history: Family history reviewed and not pertinent. ? ?Prior to Admission medications   ?Medication Sig Start Date End Date Taking? Authorizing Provider  ?gabapentin (NEURONTIN) 100 MG capsule Take 300 mg by mouth 3 (three) times daily. 03/26/21   [provider]  ?lactulose (CHRONULAC) 10 GM/15ML solution Take 15 - 30 ml daily as needed for constipation 04/08/21   Borders, Kirt Boys, NP  ?oxyCODONE (OXY IR/ROXICODONE) 5 MG immediate release tablet Take 1 tablet  (5 mg total) by mouth every 4 (four) hours as needed for severe pain. 04/05/21   Lloyd Huger, MD  ?traZODone (DESYREL) 50 MG tablet TAKE 1 TO 2 TABLETS BY MOUTH AT BEDTIME ?Patient taking differently: Ta

## 2021-04-26 DIAGNOSIS — I2510 Atherosclerotic heart disease of native coronary artery without angina pectoris: Secondary | ICD-10-CM | POA: Diagnosis not present

## 2021-04-26 DIAGNOSIS — Z515 Encounter for palliative care: Principal | ICD-10-CM

## 2021-04-26 DIAGNOSIS — C220 Liver cell carcinoma: Secondary | ICD-10-CM

## 2021-04-26 MED ORDER — LORAZEPAM 2 MG/ML PO CONC: 1.0000 mg | ORAL | Status: DC | PRN

## 2021-04-26 MED ORDER — LORAZEPAM 1 MG PO TABS: 1.0000 mg | ORAL_TABLET | ORAL | Status: DC | PRN

## 2021-04-26 MED ORDER — LORAZEPAM 2 MG/ML IJ SOLN
1.0000 mg | INTRAMUSCULAR | Status: DC | PRN
  Administered 2021-04-26 – 2021-04-27 (×2): 1 mg via INTRAVENOUS
  Filled 2021-04-26: qty 1

## 2021-04-26 NOTE — Progress Notes (Signed)
?PROGRESS NOTE ? ? ? ?Timothy Murray  DEY:[id number] DOB: 1945-09-22 DOA: 04/14/2021 ?PCP: Perrin Maltese, MD  ? ? ?Brief Narrative:  ?[name] is a 76 year old male with history of hepatocellular carcinoma status post proton beam ablation in June 2021, liver cirrhosis, myelodysplastic syndrome with 5 q. deletion and p53 positive, chronic thrombocytopenia presumed secondary to liver cirrhosis, history of acute biliary gallstone pancreatitis status post ERCP inpatient with platelet transfusion, status post biliary sphincterotomy, currently receiving home hospice, who presents to the emergency department for chief concerns of palliative care consult and receiving better comfort care pain management. ? ?Assessment & Plan: ?  ?Principal Problem: ?  End of life care ?Active Problems: ?  Type 2 diabetes mellitus with hyperglycemia, without long-term current use of insulin ([place]) ?  Hospice care ?  Coronary artery disease involving native coronary artery of native heart without angina pectoris ?  Essential hypertension ?  GERD without esophagitis ?  Hepatocellular carcinoma ([place]) ?  Thrombocytopenia ([place]) ?  Acute pancreatitis ?  Cirrhosis of liver on imaging, likely post proton-beam radiation [facility]) ?  Degeneration of lumbar intervertebral disc ?  Coronary artery disease ?  Hyperlipidemia ?  Acute on chronic respiratory failure with hypoxia (HCC) ? ?Assessment and Plan: ?Continue end-of-life care.  Discussed with patient and wife, she prefer patient stay in the hospital to die.  Hospice is aware. ? ? ? ?Status is: Inpatient ?Remains inpatient appropriate because: End-of-life care. ?  ? ?No intake/output data recorded. ?Total I/O ?In: -  ?Out: 275 [Urine:275] ?  ? ?Subjective: ?Patient is unresponsive, minimal p.o. intake.  Still requiring pain medicine. ? ?Objective: ?Vitals:  ? 04/28/2021 1900 04/10/2021 2000 04/17/2021 2130 04/26/21 0800  ?BP: (!) 130/51 (!) 123/48 98/69 (!) 102/51  ?Pulse: (!) 107 (!) 112 (!) 103 (!)  110  ?Resp: 19 19 18 19   ?Temp:    98.5 ?F (36.9 ?C)  ?TempSrc:      ?SpO2: 100% 100% 100% 100%  ?Weight:      ?Height:      ? ? ?Intake/Output Summary (Last 24 hours) at 04/26/2021 1207 ?Last data filed at 04/26/2021 1110 ?Gross per 24 hour  ?Intake --  ?Output 275 ml  ?Net -275 ml  ? ?Filed Weights  ? 04/19/2021 1811  ?Weight: 82.1 kg  ? ? ?Examination: ? ?General exam: Appears calm and comfortable  ?Respiratory system: Decreased breathing sounds to auscultation. Respiratory effort normal. ?Cardiovascular system: S1 & S2 heard, RRR. No JVD, murmurs, rubs, gallops or clicks. No pedal edema. ?Gastrointestinal system: Abdomen is nondistended, soft and nontender. No organomegaly or masses felt. Normal bowel sounds heard. ?Central nervous system: Drowsy and unresponsive ?Extremities: no edema ?Skin: No rashes, lesions or ulcers ? ? ? ?Data Reviewed: I have personally reviewed following labs and imaging studies ? ?CBC: ?No results for input(s): WBC, NEUTROABS, HGB, HCT, MCV, PLT in the last 168 hours. ?Basic Metabolic Panel: ?No results for input(s): NA, K, CL, CO2, GLUCOSE, BUN, CREATININE, CALCIUM, MG, PHOS in the last 168 hours. ?GFR: ?Estimated Creatinine Clearance: 74.5 mL/min (by C-G formula based on SCr of 0.94 mg/dL). ?Liver Function Tests: ?No results for input(s): AST, ALT, ALKPHOS, BILITOT, PROT, ALBUMIN in the last 168 hours. ?No results for input(s): LIPASE, AMYLASE in the last 168 hours. ?No results for input(s): AMMONIA in the last 168 hours. ?Coagulation Profile: ?No results for input(s): INR, PROTIME in the last 168 hours. ?Cardiac Enzymes: ?No results for input(s): CKTOTAL, CKMB, CKMBINDEX, TROPONINI in the last  168 hours. ?BNP (last 3 results) ?No results for input(s): PROBNP in the last 8760 hours. ?HbA1C: ?No results for input(s): HGBA1C in the last 72 hours. ?CBG: ?No results for input(s): GLUCAP in the last 168 hours. ?Lipid Profile: ?No results for input(s): CHOL, HDL, LDLCALC, TRIG, CHOLHDL,  LDLDIRECT in the last 72 hours. ?Thyroid Function Tests: ?No results for input(s): TSH, T4TOTAL, FREET4, T3FREE, THYROIDAB in the last 72 hours. ?Anemia Panel: ?No results for input(s): VITAMINB12, FOLATE, FERRITIN, TIBC, IRON, RETICCTPCT in the last 72 hours. ?Sepsis Labs: ?No results for input(s): PROCALCITON, LATICACIDVEN in the last 168 hours. ? ?No results found for this or any previous visit (from the past 240 hour(s)).  ? ? ? ? ? ?Radiology Studies: ?No results found. ? ? ? ? ? ?Scheduled Meds: ? traZODone  50 mg Oral QHS  ? ?Continuous Infusions: ? chlorproMAZINE (THORAZINE) IV    ? ? ? LOS: 1 day  ? ? ?Time spent: 25 minutes ? ? ? ?Sharen Hones, MD ?[facility] ? ? ?To contact the attending provider between 7A-7P or the covering provider during after hours 7P-7A, please log into the web site [email or web address] and access using universal  password for that web site. If you do not have the password, please call the hospital operator. ? ?04/26/2021, 12:07 PM  ?  ?

## 2021-04-26 NOTE — TOC Progression Note (Signed)
Transition of Care (TOC) - Progression Note  ? ? ?Patient Details  ?Name: Timothy Murray ?MRN: [id number] ?Date of Birth: Dec 27, 1945 ? ?Transition of Care (TOC) CM/SW Contact  ?Izola Price, RN ?Phone Number: ?04/26/2021, 4:10 PM ? ?Clinical Narrative: 4/23: Admitted 04/16/2021. End of life care, per notes, family prefers a hospital setting for end of life and hospice is aware.  ? ? ? ?  ?  ? ?Expected Discharge Plan and Services ?  ?  ?  ?  ?  ?                ?  ?  ?  ?  ?  ?  ?  ?  ?  ?  ? ? ?Social Determinants of Health (SDOH) Interventions ?  ? ?Readmission Risk Interventions ?   ? View : No data to display.  ?  ?  ?  ? ? ?

## 2021-04-27 MED ORDER — HYDROMORPHONE HCL 1 MG/ML IJ SOLN
0.5000 mg | Freq: Once | INTRAMUSCULAR | Status: AC
  Administered 2021-04-27: 0.5 mg via INTRAVENOUS
  Filled 2021-04-27: qty 1

## 2021-04-27 MED ORDER — MORPHINE BOLUS VIA INFUSION
1.0000 mg | INTRAVENOUS | Status: DC | PRN
Start: 2021-04-27 — End: 2021-04-27
  Administered 2021-04-27 (×2): 1 mg via INTRAVENOUS
  Filled 2021-04-27: qty 1

## 2021-04-27 MED ORDER — MORPHINE 100MG IN NS 100ML (1MG/ML) PREMIX INFUSION
1.0000 mg/h | INTRAVENOUS | Status: DC
  Administered 2021-04-27: 1 mg/h via INTRAVENOUS
  Filled 2021-04-27: qty 100

## 2021-05-04 NOTE — Plan of Care (Signed)
Pt with uncontrolled pain and anxiety throughout shift. Frequent intervention of ordered PRN medication given. See mar. NP, K. Foust contacted several times throughout shift. 1 x order  for dilaudid given. Morphine drip ordered and initiated with bolus PRN administered once. Painad pain scale initially 7 consistently. Now Painad scale of 3. Physician sticky note placed to inquire about obtaining palliative consult.  Continuing to monitor.  ?

## 2021-05-04 NOTE — Death Summary Note (Signed)
? ?DEATH SUMMARY  ? ?Patient Details  ?Name: Timothy Murray ?MRN: [id number] ?DOB: Mar 01, 1945 ?[id number], Timothy Jabs, MD ?Admission/Discharge Information  ? ?Admit Date:  May 25, 2021  ?Date of Death: Date of Death: 27-May-2021  ?Time of Death: Time of Death: 0620  ?Length of Stay: 2  ? ?Principle Cause of death: Hepatocellular carcinoma ? ?Hospital Diagnoses: ?Principal Problem: ?  End of life care ?Active Problems: ?  Type 2 diabetes mellitus with hyperglycemia, without long-term current use of insulin ([place]) ?  Hospice care ?  Coronary artery disease involving native coronary artery of native heart without angina pectoris ?  Essential hypertension ?  GERD without esophagitis ?  Hepatocellular carcinoma ([place]) ?  Thrombocytopenia ([place]) ?  Acute pancreatitis ?  Cirrhosis of liver on imaging, likely post proton-beam radiation [facility]) ?  Degeneration of lumbar intervertebral disc ?  Coronary artery disease ?  Hyperlipidemia ?  Acute on chronic respiratory failure with hypoxia (HCC) ? ? ?Hospital Course: ?[name] is a 76 year old male with history of hepatocellular carcinoma status post proton beam ablation in June 2021, liver cirrhosis, myelodysplastic syndrome with 5 q. deletion and p53 positive, chronic thrombocytopenia presumed secondary to liver cirrhosis, history of acute biliary gallstone pancreatitis status post ERCP inpatient with platelet transfusion, status post biliary sphincterotomy, currently receiving home hospice, who presents to the emergency department for chief concerns of palliative care consult and receiving better comfort care pain management. ? ?Initial vitals in the emergency department show temperature of 98.7, respiration rate of 19, heart rate 109, blood pressure 130/51, currently SPO2 of 100% on 4 L nasal cannula. ? ?Labs from 04/15/21, showed serum sodium is 133, potassium 4.0, chloride 102, bicarb 26, BUN of 28, serum creatinine of 0.94, GFR greater than 60, nonfasting blood glucose 156 and  CBC from 04/15/21, showing WBC 0.7, hemoglobin 5.0, platelets of 18. ? ?ED treatment: Ativan 1 mg p.o. one-time dose, morphine 10 mg solution and 2 mg solution. ? ?Patient was placed on comfort care after admission. He died in peace 2022/05/28 at 620 am.  ? ?Assessment and Plan: ?* End of life care ?- Patient presenting from home on home hospice for comfort care for end-stage cancer, family called EMS because patient's pain management was not well controlled at home  ?He is treated in hospital with comfort care. Died in peace today.  ? ? ? ? ?  ? ? ?Procedures: None ? ?Consultations: None ? ?The results of significant diagnostics from this hospitalization (including imaging, microbiology, ancillary and laboratory) are listed below for reference.  ? ?Significant Diagnostic Studies: ?DG Chest Portable 1 View ? ?Result Date: 04/14/2021 ?CLINICAL DATA:  Difficulty breathing EXAM: PORTABLE CHEST 1 VIEW COMPARISON:  Previous studies including the examination of 03/29/2021 FINDINGS: Transverse diameter of heart is within normal limits. There is evidence of previous endovascular stent repair in thoracic aorta. There is evidence of previous coronary bypass surgery. There are no signs of alveolar pulmonary edema or focal pulmonary consolidation. There is no pleural effusion or pneumothorax. IMPRESSION: There are no signs of pulmonary edema or focal pulmonary consolidation. Electronically Signed   By: Elmer Picker M.D.   On: 04/14/2021 10:52  ? ?DG Chest Port 1 View ? ?Result Date: 03/29/2021 ?CLINICAL DATA:  Fever, weakness EXAM: PORTABLE CHEST 1 VIEW COMPARISON:  03/13/2021 FINDINGS: Single frontal view of the chest demonstrates stable postsurgical changes from CABG. Endoluminal stent graft of the distal aortic arch and descending thoracic aorta unchanged. Cardiac silhouette is stable. No acute airspace disease,  effusion, or pneumothorax. Lung volumes are diminished with crowding of the central vasculature. No acute bony  abnormalities. IMPRESSION: 1. Mild vascular congestion.  No acute airspace disease. Electronically Signed   By: Randa Ngo M.D.   On: 03/29/2021 21:31   ? ?Microbiology: ?No results found for this or any previous visit (from the past 240 hour(s)). ? ?Time spent: 20 minutes ? ?Signed: ?Sharen Hones, MD ?05-14-21 ? ? ?

## 2021-05-04 NOTE — Progress Notes (Signed)
Nutrition Brief Note  Chart reviewed. Pt now transitioning to comfort care.  No further nutrition interventions planned at this time.  Please re-consult as needed.   Chazlyn Cude W, RD, LDN, CDCES Registered Dietitian II Certified Diabetes Care and Education Specialist Please refer to [facility] for RD and/or RD on-call/weekend/after hours pager   

## 2021-05-04 NOTE — Progress Notes (Signed)
?   05/13/21 2824  ?Clinical Encounter Type  ?Visited With Patient and family together  ?Visit Type Spiritual support;Social support;Death  ?Referral From Nurse  ?Consult/Referral To Chaplain  ?Spiritual Encounters  ?Spiritual Needs Prayer;Grief support  ? ?Timothy Murray attended the bedside with Pt's spouse, Timothy Murray, and Chartered loss adjuster. Pt had just passed away. Chaplain B spent time to gain a sense of their beliefs and how to best support them at this time. Mrs. Arseneau expressed their [employer] Me[place] faith and shared that they have been together twenty years. ? ?Timothy Murray offered prayer and honored the memory of Mr. Hepburn. Chaplain B also remained at the bedside to offer grief support and help Timothy Murray in processing her loss. She shared that they had listened to gospel music together as he passed (which they both loved and listened to regularly). It sounded like a peaceful death and that their faith was very comforting to them. ?

## 2021-05-04 NOTE — Progress Notes (Signed)
Pt pronounced 0620. Chaplin at bedside with family.  ?

## 2021-05-04 NOTE — Progress Notes (Signed)
? ?      CROSS COVER NOTE ? ?NAME: Timothy Murray ?MRN: [id number] ?DOB : Jul 19, 1945 ? ? ? ? ? ? ? ? ?Notified by RN that patient has expired at 0620 ? ?Patient was DNR and comfort care ? ?2 RN verified. ? ?Timothy Murray passed peacefully with his wife at bedside. ? ? ? ?Neomia Glass MHA, MSN, FNP-BC ?Nurse Practitioner ?[facility] ?[place] ?Pager [phone] ? ? ? ?

## 2021-05-04 DEATH — deceased

## 2021-09-03 ENCOUNTER — Other Ambulatory Visit (HOSPITAL_COMMUNITY): Payer: Self-pay
